# Patient Record
Sex: Male | Born: 1940 | Race: White | Hispanic: No | Marital: Married | Smoking: Never smoker
Health system: Southern US, Community
[De-identification: ages and names within clinical notes are randomized; demographics above are authoritative.]

## PROBLEM LIST (undated history)

## (undated) DIAGNOSIS — N529 Male erectile dysfunction, unspecified: Secondary | ICD-10-CM

## (undated) DIAGNOSIS — B009 Herpesviral infection, unspecified: Secondary | ICD-10-CM

## (undated) DIAGNOSIS — K219 Gastro-esophageal reflux disease without esophagitis: Secondary | ICD-10-CM

## (undated) DIAGNOSIS — F419 Anxiety disorder, unspecified: Secondary | ICD-10-CM

## (undated) DIAGNOSIS — E1140 Type 2 diabetes mellitus with diabetic neuropathy, unspecified: Secondary | ICD-10-CM

## (undated) DIAGNOSIS — Q211 Atrial septal defect: Secondary | ICD-10-CM

## (undated) DIAGNOSIS — Z8709 Personal history of other diseases of the respiratory system: Secondary | ICD-10-CM

## (undated) DIAGNOSIS — R109 Unspecified abdominal pain: Secondary | ICD-10-CM

## (undated) DIAGNOSIS — I5189 Other ill-defined heart diseases: Secondary | ICD-10-CM

## (undated) DIAGNOSIS — Z8489 Family history of other specified conditions: Secondary | ICD-10-CM

## (undated) DIAGNOSIS — Q2112 Patent foramen ovale: Secondary | ICD-10-CM

## (undated) DIAGNOSIS — I495 Sick sinus syndrome: Secondary | ICD-10-CM

## (undated) DIAGNOSIS — I4819 Other persistent atrial fibrillation: Secondary | ICD-10-CM

## (undated) DIAGNOSIS — I480 Paroxysmal atrial fibrillation: Secondary | ICD-10-CM

## (undated) DIAGNOSIS — I639 Cerebral infarction, unspecified: Secondary | ICD-10-CM

## (undated) DIAGNOSIS — I10 Essential (primary) hypertension: Secondary | ICD-10-CM

## (undated) DIAGNOSIS — K760 Fatty (change of) liver, not elsewhere classified: Secondary | ICD-10-CM

## (undated) DIAGNOSIS — M549 Dorsalgia, unspecified: Secondary | ICD-10-CM

## (undated) DIAGNOSIS — E7800 Pure hypercholesterolemia, unspecified: Secondary | ICD-10-CM

## (undated) DIAGNOSIS — I2510 Atherosclerotic heart disease of native coronary artery without angina pectoris: Secondary | ICD-10-CM

## (undated) DIAGNOSIS — M1990 Unspecified osteoarthritis, unspecified site: Secondary | ICD-10-CM

## (undated) DIAGNOSIS — Z8619 Personal history of other infectious and parasitic diseases: Secondary | ICD-10-CM

## (undated) DIAGNOSIS — K5790 Diverticulosis of intestine, part unspecified, without perforation or abscess without bleeding: Secondary | ICD-10-CM

## (undated) DIAGNOSIS — N200 Calculus of kidney: Secondary | ICD-10-CM

## (undated) HISTORY — DX: Cerebral infarction, unspecified: I63.9

## (undated) HISTORY — DX: Essential (primary) hypertension: I10

## (undated) HISTORY — DX: Paroxysmal atrial fibrillation: I48.0

## (undated) HISTORY — DX: Sick sinus syndrome: I49.5

## (undated) HISTORY — DX: Pure hypercholesterolemia, unspecified: E78.00

## (undated) HISTORY — DX: Other ill-defined heart diseases: I51.89

## (undated) HISTORY — DX: Personal history of other infectious and parasitic diseases: Z86.19

## (undated) HISTORY — DX: Other persistent atrial fibrillation: I48.19

## (undated) HISTORY — DX: Dorsalgia, unspecified: M54.9

## (undated) HISTORY — DX: Unspecified osteoarthritis, unspecified site: M19.90

## (undated) HISTORY — DX: Diverticulosis of intestine, part unspecified, without perforation or abscess without bleeding: K57.90

## (undated) HISTORY — DX: Type 2 diabetes mellitus with diabetic neuropathy, unspecified: E11.40

## (undated) HISTORY — DX: Fatty (change of) liver, not elsewhere classified: K76.0

## (undated) HISTORY — DX: Anxiety disorder, unspecified: F41.9

## (undated) HISTORY — DX: Patent foramen ovale: Q21.12

## (undated) HISTORY — DX: Herpesviral infection, unspecified: B00.9

## (undated) HISTORY — DX: Unspecified abdominal pain: R10.9

## (undated) HISTORY — PX: HERNIA REPAIR: SHX51

## (undated) HISTORY — DX: Atrial septal defect: Q21.1

## (undated) HISTORY — DX: Male erectile dysfunction, unspecified: N52.9

## (undated) HISTORY — DX: Personal history of other diseases of the respiratory system: Z87.09

## (undated) HISTORY — DX: Calculus of kidney: N20.0

---

## 1981-08-08 HISTORY — PX: CHOLECYSTECTOMY: SHX55

## 1999-07-30 ENCOUNTER — Encounter: Admission: RE | Admit: 1999-07-30 | Discharge: 1999-07-30 | Payer: Self-pay | Admitting: Family Medicine

## 1999-07-30 ENCOUNTER — Encounter: Payer: Self-pay | Admitting: Family Medicine

## 2000-10-13 ENCOUNTER — Encounter: Admission: RE | Admit: 2000-10-13 | Discharge: 2000-10-13 | Payer: Self-pay | Admitting: Family Medicine

## 2000-10-13 ENCOUNTER — Encounter: Payer: Self-pay | Admitting: Family Medicine

## 2000-12-27 ENCOUNTER — Encounter: Admission: RE | Admit: 2000-12-27 | Discharge: 2000-12-27 | Payer: Self-pay | Admitting: Family Medicine

## 2000-12-27 ENCOUNTER — Encounter: Payer: Self-pay | Admitting: Family Medicine

## 2002-02-01 ENCOUNTER — Encounter: Admission: RE | Admit: 2002-02-01 | Discharge: 2002-02-01 | Payer: Self-pay | Admitting: Family Medicine

## 2002-02-01 ENCOUNTER — Encounter: Payer: Self-pay | Admitting: Family Medicine

## 2002-09-27 ENCOUNTER — Ambulatory Visit (HOSPITAL_COMMUNITY): Admission: RE | Admit: 2002-09-27 | Discharge: 2002-09-27 | Payer: Self-pay | Admitting: Family Medicine

## 2002-09-27 ENCOUNTER — Encounter: Payer: Self-pay | Admitting: Cardiology

## 2002-12-05 ENCOUNTER — Encounter: Admission: RE | Admit: 2002-12-05 | Discharge: 2003-03-05 | Payer: Self-pay | Admitting: Family Medicine

## 2003-10-20 ENCOUNTER — Encounter: Admission: RE | Admit: 2003-10-20 | Discharge: 2003-10-20 | Payer: Self-pay | Admitting: Family Medicine

## 2003-12-22 ENCOUNTER — Emergency Department (HOSPITAL_COMMUNITY): Admission: EM | Admit: 2003-12-22 | Discharge: 2003-12-22 | Payer: Self-pay | Admitting: Emergency Medicine

## 2003-12-22 ENCOUNTER — Emergency Department (HOSPITAL_COMMUNITY): Admission: EM | Admit: 2003-12-22 | Discharge: 2003-12-23 | Payer: Self-pay | Admitting: Emergency Medicine

## 2003-12-30 ENCOUNTER — Ambulatory Visit (HOSPITAL_BASED_OUTPATIENT_CLINIC_OR_DEPARTMENT_OTHER): Admission: RE | Admit: 2003-12-30 | Discharge: 2003-12-30 | Payer: Self-pay | Admitting: Urology

## 2004-09-21 ENCOUNTER — Encounter: Admission: RE | Admit: 2004-09-21 | Discharge: 2004-09-21 | Payer: Self-pay | Admitting: Family Medicine

## 2005-01-14 ENCOUNTER — Ambulatory Visit: Payer: Self-pay

## 2005-01-18 ENCOUNTER — Ambulatory Visit: Payer: Self-pay

## 2005-01-26 ENCOUNTER — Ambulatory Visit: Payer: Self-pay

## 2005-01-28 ENCOUNTER — Ambulatory Visit: Payer: Self-pay | Admitting: Internal Medicine

## 2005-02-02 ENCOUNTER — Ambulatory Visit: Payer: Self-pay | Admitting: Internal Medicine

## 2005-02-02 ENCOUNTER — Encounter (INDEPENDENT_AMBULATORY_CARE_PROVIDER_SITE_OTHER): Payer: Self-pay | Admitting: *Deleted

## 2005-02-24 ENCOUNTER — Ambulatory Visit (HOSPITAL_COMMUNITY): Admission: RE | Admit: 2005-02-24 | Discharge: 2005-02-24 | Payer: Self-pay | Admitting: Cardiology

## 2005-02-24 ENCOUNTER — Ambulatory Visit: Payer: Self-pay | Admitting: Internal Medicine

## 2005-08-12 ENCOUNTER — Encounter: Admission: RE | Admit: 2005-08-12 | Discharge: 2005-08-12 | Payer: Self-pay | Admitting: Family Medicine

## 2006-02-23 ENCOUNTER — Ambulatory Visit: Payer: Self-pay | Admitting: Internal Medicine

## 2007-09-21 ENCOUNTER — Emergency Department (HOSPITAL_COMMUNITY): Admission: EM | Admit: 2007-09-21 | Discharge: 2007-09-21 | Payer: Self-pay | Admitting: Family Medicine

## 2007-11-16 ENCOUNTER — Emergency Department: Payer: Self-pay | Admitting: Emergency Medicine

## 2007-11-22 ENCOUNTER — Emergency Department: Payer: Self-pay | Admitting: Internal Medicine

## 2008-01-29 ENCOUNTER — Ambulatory Visit: Payer: Self-pay | Admitting: Vascular Surgery

## 2008-01-29 ENCOUNTER — Ambulatory Visit: Admission: RE | Admit: 2008-01-29 | Discharge: 2008-01-29 | Payer: Self-pay | Admitting: Family Medicine

## 2008-01-29 ENCOUNTER — Encounter: Payer: Self-pay | Admitting: Family Medicine

## 2009-03-27 ENCOUNTER — Encounter (INDEPENDENT_AMBULATORY_CARE_PROVIDER_SITE_OTHER): Payer: Self-pay | Admitting: *Deleted

## 2009-07-29 ENCOUNTER — Encounter: Admission: RE | Admit: 2009-07-29 | Discharge: 2009-07-29 | Payer: Self-pay | Admitting: Internal Medicine

## 2009-08-08 LAB — HM COLONOSCOPY

## 2009-08-21 ENCOUNTER — Telehealth: Payer: Self-pay | Admitting: Internal Medicine

## 2009-08-24 ENCOUNTER — Encounter (INDEPENDENT_AMBULATORY_CARE_PROVIDER_SITE_OTHER): Payer: Self-pay | Admitting: *Deleted

## 2009-09-18 ENCOUNTER — Encounter (INDEPENDENT_AMBULATORY_CARE_PROVIDER_SITE_OTHER): Payer: Self-pay | Admitting: *Deleted

## 2009-09-21 ENCOUNTER — Ambulatory Visit: Payer: Self-pay | Admitting: Internal Medicine

## 2009-09-21 ENCOUNTER — Encounter (INDEPENDENT_AMBULATORY_CARE_PROVIDER_SITE_OTHER): Payer: Self-pay | Admitting: *Deleted

## 2009-09-28 ENCOUNTER — Telehealth (INDEPENDENT_AMBULATORY_CARE_PROVIDER_SITE_OTHER): Payer: Self-pay | Admitting: *Deleted

## 2009-09-29 ENCOUNTER — Ambulatory Visit: Payer: Self-pay | Admitting: Internal Medicine

## 2009-09-30 ENCOUNTER — Encounter: Payer: Self-pay | Admitting: Internal Medicine

## 2010-09-07 NOTE — Miscellaneous (Signed)
Summary: Prilosec was ordered for pt in RR  Clinical Lists Changes  Medications: Added new medication of PRILOSEC OTC 20 MG  TBEC (OMEPRAZOLE MAGNESIUM) 1 each day 30 minutes before meal for 2 weeks - Signed Rx of PRILOSEC OTC 20 MG  TBEC (OMEPRAZOLE MAGNESIUM) 1 each day 30 minutes before meal for 2 weeks;  #30 x 1;  Signed;  Entered by: Doristine Church RN II;  Authorized by: Hart Carwin MD;  Method used: [facility]  [address] [place], [place]  [address], Ph: [phone] or [phone], Fax: [phone] Observations: Added new observation of ALLERGY REV: Done (09/30/2009 8:04) Added new observation of NKA: T (09/30/2009 8:04)    Prescriptions: PRILOSEC OTC 20 MG  TBEC (OMEPRAZOLE MAGNESIUM) 1 each day 30 minutes before meal for 2 weeks  #30 x 1   Entered by:   Doristine Church RN II   Authorized by:   Hart Carwin MD   Signed by:   Doristine Church RN II on 09/30/2009   Method used:   [facility]ically [facility]        [facility]  [address] (retail)       [address]       [place], [place]  [address]       Ph: [phone] or [phone]       Fax: [phone]   RxID:   [id number]

## 2010-09-07 NOTE — Procedures (Signed)
Summary: Colonoscopy  Patient: [name] Note: All result statuses are Final unless otherwise noted.  Tests: (1) Colonoscopy (COL)   COL Colonoscopy           DONE     [facility]     [address]     [place], [place]  [address]           COLONOSCOPY PROCEDURE REPORT           PATIENT:  [name] [name], [name] [name]  MR#:  [id number]     BIRTHDATE:  1941/06/06, 68 yrs. old  GENDER:  male           ENDOSCOPIST:  Hedwig Morton. Juanda Chance, MD     Referred by:  Tomi Bamberger, NP           PROCEDURE DATE:  09/29/2009     PROCEDURE:  Colonoscopy 11914     ASA CLASS:  Class I     INDICATIONS:  several indirect relatives ( GM) with colon cancer     last colon 1999 and 2005           MEDICATIONS:   Versed 3 mg, Fentanyl 25 mcg           DESCRIPTION OF PROCEDURE:   After the risks benefits and     alternatives of the procedure were thoroughly explained, informed     consent was obtained.  Digital rectal exam was performed and     revealed no rectal masses.   The LB CF-H180AL [id number] endoscope     was introduced through the anus and advanced to the cecum, which     was identified by both the appendix and ileocecal valve, without     limitations.  The quality of the prep was excellent, using     MiraLax.  The instrument was then slowly withdrawn as the colon     was fully examined.     <<PROCEDUREIMAGES>>           FINDINGS:  Moderate diverticulosis was found throughout the colon     (see image1, image2, image3, and image6).  This was otherwise a     normal examination of the colon (see image7, image5, and image4).     Retroflexed views in the rectum revealed no abnormalities.    The     scope was then withdrawn from the patient and the procedure     completed.           COMPLICATIONS:  None           ENDOSCOPIC IMPRESSION:     1) Moderate diverticulosis throughout the colon     2) Otherwise normal examination     RECOMMENDATIONS:     high fiber diet           REPEAT EXAM:  In 10 year(s) for.         ______________________________     Hedwig Morton. Juanda Chance, MD           CC:           n.     eSIGNED:   Hedwig Morton. Janny Crute at 09/29/2009 08:58 AM           Asa Saunas, [id number]  Note: An exclamation mark (!) indicates a result that was not dispersed into the flowsheet. Document Creation Date: 09/29/2009 8:58 AM _______________________________________________________________________  (1) Order result status: Final Collection or observation date-time: 09/29/2009 08:40 Requested date-time:  Receipt date-time:  Reported date-time:  Referring Physician:  Ordering Physician: [name] [phone]) Specimen Source:  Source: Launa Grill Order Number: [id number] Lab site:   Appended Document: Colonoscopy    Clinical Lists Changes  Observations: Added new observation of COLONNXTDUE: 09/2019 (09/29/2009 10:34)      Appended Document: Colonoscopy     Procedures Next Due Date:    Colonoscopy: 10/2019

## 2010-09-07 NOTE — Progress Notes (Signed)
Summary: Schedule Recall Colonoscopy  Phone Note Outgoing Call Call back at [facility] Phone [phone]   Call placed by: Christie Nottingham CMA Duncan Dull),  August 21, 2009 11:01 AM Call placed to: Patient Summary of Call: Called pt to schedule recall colonoscopy. Pt is wondering if he can get a Upper Endoscopy at the same time b/c he is having some symptoms of  sore throat and coughing. He states he inhaled some harsh chemicals last week and coughed up blood. He has already seen his PCP and had a x-ray of his lungs and it was normal but wants to have his stomach looked at also. I offered a office visit but he declined and would like this set up at the same time of his Colonoscopy. Can we schedule a direct EGD/ Colon? Initial call taken by: Christie Nottingham CMA Duncan Dull),  August 21, 2009 11:05 AM  Follow-up for Phone Call        OK to schedule direct EGD/Colon Follow-up by: Hart Carwin MD,  August 21, 2009 11:03 PM  Additional Follow-up for Phone Call Additional follow up Details #1::        Schedule EGD/ Colon on 09-29-09. Pt notified and letter mailed to pt with previsit info.  Additional Follow-up by: Christie Nottingham CMA Duncan Dull),  August 24, 2009 8:54 AM

## 2010-09-07 NOTE — Letter (Signed)
Summary: Diabetic Instructions  [facility]  [address] [place], [place] [address]   Phone: [phone]  Fax: [phone]    CANNEN DUPRAS 1940-12-23 MRN: [id number]   _  _   ORAL DIABETIC MEDICATION INSTRUCTIONS  The day before your procedure:   Take your diabetic pill as you do normally  The day of your procedure:   Do not take your diabetic pill    We will check your blood sugar levels during the admission process and again in Recovery before discharging you home  ________________________________________________________________________

## 2010-09-07 NOTE — Progress Notes (Signed)
Summary: ? re prep  Phone Note Call from Patient Call back at Home Phone [phone]   Caller: Patient Call For: BRODIE Reason for Call: Talk to Nurse Summary of Call: Patient has questions regarding his prep. having procedure tomorrow Initial call taken by: Tawni Levy,  September 28, 2009 11:02 AM  Follow-up for Phone Call        no answer. Will try again later. Follow-up by: Ezra Sites RN,  September 28, 2009 11:51 AM    Additional Follow-up for Phone Call Additional follow up Details #2::    talked to pt's wife.  She says that pt had his question answered by pharmacist. Follow-up by: Ezra Sites RN,  September 28, 2009 4:36 PM

## 2010-09-07 NOTE — Miscellaneous (Signed)
Summary: [facility]  Clinical Lists Changes  Medications: Added new medication of MIRALAX   POWD (POLYETHYLENE GLYCOL 3350) As per prep  instructions. - Signed Added new medication of REGLAN 10 MG  TABS (METOCLOPRAMIDE HCL) As per prep instructions. - Signed Added new medication of DULCOLAX 5 MG  TBEC (BISACODYL) Day before procedure take 2 at 3pm and 2 at 8pm. - Signed Rx of MIRALAX   POWD (POLYETHYLENE GLYCOL 3350) As per prep  instructions.;  #255gm x 0;  Signed;  Entered by: Ezra Sites RN;  Authorized by: Hart Carwin MD;  Method used: [facility]  [address] [place], [place]  [address], Ph: [phone] or [phone], Fax: [phone] Rx of REGLAN 10 MG  TABS (METOCLOPRAMIDE HCL) As per prep instructions.;  #2 x 0;  Signed;  Entered by: Ezra Sites RN;  Authorized by: Hart Carwin MD;  Method used: [facility]  [address] [place], [place]  [address], Ph: [phone] or [phone], Fax: [phone] Rx of DULCOLAX 5 MG  TBEC (BISACODYL) Day before procedure take 2 at 3pm and 2 at 8pm.;  #4 x 0;  Signed;  Entered by: Ezra Sites RN;  Authorized by: Hart Carwin MD;  Method used: [facility]  [address] [place], [place]  [address], Ph: [phone] or [phone], Fax: [phone] Observations: Added new observation of NKA: T (09/21/2009 7:47)    Prescriptions: DULCOLAX 5 MG  TBEC (BISACODYL) Day before procedure take 2 at 3pm and 2 at 8pm.  #4 x 0   Entered by:   Ezra Sites RN   Authorized by:   Hart Carwin MD   Signed by:   Ezra Sites RN on 09/21/2009   Method used:   Electronically to        [facility]  [address] (retail)       [address]       [place], [place]  [address]       Ph: [phone] or [phone]       Fax: [phone]   RxID:   [id number] REGLAN 10 MG  TABS (METOCLOPRAMIDE HCL) As per prep instructions.  #2 x 0   Entered by:   Ezra Sites RN   Authorized by:   Hart Carwin MD   Signed by:   Ezra Sites RN on 09/21/2009   Method used:   Electronically to        [facility]  [address] (retail)       [address]       [place], [place]  [address]       Ph: [phone] or [phone]       Fax: [phone]   RxID:   [id number] MIRALAX   POWD (POLYETHYLENE GLYCOL 3350) As per prep  instructions.  #255gm x 0   Entered by:   Ezra Sites RN   Authorized by:   Hart Carwin MD   Signed by:   Ezra Sites RN on 09/21/2009   Method used:   Electronically to        [facility]  [address] (retail)       [address]       [place], [place]  [address]       Ph: [phone] or [phone]       Fax: [phone]   RxID:   [id number]

## 2010-09-07 NOTE — Letter (Signed)
Summary: [facility] Instructions  [facility]  [address] [place], [place] [address]   Phone: [phone]  Fax: [phone]       [name] [name]    Dec 25, 1970    MRN: [id number]       Procedure Day /Date:  Tuesday 09/29/2009     Arrival Time:  7:30 am     Procedure Time: 8:00 am     Location of Procedure:                    _x _  [facility] (4th Floor)    PREPARATION FOR COLONOSCOPY WITH MIRALAX  Starting 5 days prior to your procedure Thursday 2/17 do not eat nuts, seeds, popcorn, corn, beans, peas,  salads, or any raw vegetables.  Do not take any fiber supplements (e.g. Metamucil, Citrucel, and Benefiber). ____________________________________________________________________________________________________   THE DAY BEFORE YOUR PROCEDURE         DATE: Monday 2/21  1   Drink clear liquids the entire day-NO SOLID FOOD  2   Do not drink anything colored red or purple.  Avoid juices with pulp.  No orange juice.  3   Drink at least 64 oz. (8 glasses) of fluid/clear liquids during the day to prevent dehydration and help the prep work efficiently.  CLEAR LIQUIDS INCLUDE: Water Jello Ice Popsicles Tea (sugar ok, no milk/cream) Powdered fruit flavored drinks Coffee (sugar ok, no milk/cream) Gatorade Juice: apple, white grape, white cranberry  Lemonade Clear bullion, consomm, broth Carbonated beverages (any kind) Strained chicken noodle soup Hard Candy  4   Mix the entire bottle of Miralax with 64 oz. of Gatorade/Powerade in the morning and put in the refrigerator to chill.  5   At 3:00 pm take 2 Dulcolax/Bisacodyl tablets.  6   At 4:30 pm take one Reglan/Metoclopramide tablet.  7  Starting at 5:00 pm drink one 8 oz glass of the Miralax mixture every 15-20 minutes until you have finished drinking the entire 64 oz.  You should finish drinking prep around 7:30 or 8:00 pm.  8   If you are nauseated, you may take the 2nd Reglan/Metoclopramide tablet  at 6:30 pm.        9    At 8:00 pm take 2 more DULCOLAX/Bisacodyl tablets.     THE DAY OF YOUR PROCEDURE      DATE:  Tuesday 2/22  You may drink clear liquids until 6:00 am  (2 HOURS BEFORE PROCEDURE).   MEDICATION INSTRUCTIONS  Unless otherwise instructed, you should take regular prescription medications with a small sip of water as early as possible the morning of your procedure.  Diabetic patients - see separate instructions.           OTHER INSTRUCTIONS  You will need a responsible adult at least 71 years of age to accompany you and drive you home.   This person must remain in the waiting room during your procedure.  Wear loose fitting clothing that is easily removed.  Leave jewelry and other valuables at home.  However, you may wish to bring a book to read or an iPod/MP3 player to listen to music as you wait for your procedure to start.  Remove all body piercing jewelry and leave at home.  Total time from sign-in until discharge is approximately 2-3 hours.  You should go home directly after your procedure and rest.  You can resume normal activities the day after your procedure.  The day of your  procedure you should not:   Drive   Make legal decisions   Operate machinery   Drink alcohol   Return to work  You will receive specific instructions about eating, activities and medications before you leave.   The above instructions have been reviewed and explained to me by   Ezra Sites RN  September 21, 2009 8:18 AM  I fully understand and can verbalize these instructions _____________________________ Date _______

## 2010-09-07 NOTE — Letter (Signed)
Summary: Previsit letter  [facility]  [address] [place], [place] [address]   Phone: [phone]  Fax: [phone]       08/24/2009 MRN: [id number]  [name] [name] [address] [place], [place]  [address]  Dear [name] [name],  Welcome to the Gastroenterology Division at [facility].    You are scheduled to see a nurse for your pre-procedure visit on  09/21/09 at  8:00am  on the 3rd floor at [facility], [address]  We ask that you try to arrive at our office 15 minutes prior to your appointment time to allow for check-in.  Your nurse visit will consist of discussing your medical and surgical history, your immediate family medical history, and your medications.    Please bring a complete list of all your medications or, if you prefer, bring the medication bottles and we will list them.  We will need to be aware of both prescribed and over the counter drugs.  We will need to know exact dosage information as well.  If you are on blood thinners (Coumadin, Plavix, Aggrenox, Ticlid, etc.) please call our office today/prior to your appointment, as we need to consult with your physician about holding your medication.   Please be prepared to read and sign documents such as consent forms, a financial agreement, and acknowledgement forms.  If necessary, and with your consent, a friend or relative is welcome to sit-in on the nurse visit with you.  Please bring your insurance card so that we may make a copy of it.  If your insurance requires a referral to see a specialist, please bring your referral form from your primary care physician.  No co-pay is required for this nurse visit.     If you cannot keep your appointment, please call [phone] to cancel or reschedule prior to your appointment date.  This allows [place] the opportunity to schedule an appointment for another patient in need of care.    Thank you for choosing [facility] for your medical  needs.  We appreciate the opportunity to care for you.  Please visit [place] at our website  to learn more about our practice.                     Sincerely.                                                                                                                   The Gastroenterology Division

## 2010-09-07 NOTE — Procedures (Signed)
Summary: Upper Endoscopy  Patient: [name] [name] Note: All result statuses are Final unless otherwise noted.  Tests: (1) Upper Endoscopy (EGD)   EGD Upper Endoscopy       DONE     [facility]     [address]     [place], [place]  [address]           ENDOSCOPY PROCEDURE REPORT           PATIENT:  Fue, Cervenka  MR#:  [id number]     BIRTHDATE:  1941-08-07, 68 yrs. old  GENDER:  male           ENDOSCOPIST:  Hedwig Morton. Juanda Chance, MD     Referred by:  Tomi Bamberger, NP           PROCEDURE DATE:  09/29/2009     PROCEDURE:  EGD with biopsy     ASA CLASS:  Class I     INDICATIONS:  GERD last EGD 2006 was normal, pt no longer takes     Nexiem,           MEDICATIONS:   Versed 5 mg, Fentanyl 50 mcg     TOPICAL ANESTHETIC:  Exactacain Spray           DESCRIPTION OF PROCEDURE:   After the risks benefits and     alternatives of the procedure were thoroughly explained, informed     consent was obtained.  The LB GIF-H180 [id number] endoscope was     introduced through the mouth and advanced to the second portion of     the duodenum, without limitations.  The instrument was slowly     withdrawn as the mucosa was fully examined.     <<PROCEDUREIMAGES>>           Mild gastritis was found. several streaks of coffee ground     material in the antrum, mild erythema and few erosions With     standard forceps, a biopsy was obtained and sent to pathology (see     image2).  A sessile polyp was found in the fundus. fundic gland     polyp With standard forceps, a biopsy was obtained and sent to     pathology (see image5 and image1).  Normal GE junction was noted.     With standard forceps, a biopsy was obtained and sent to pathology     (see image6).  Otherwise the examination was normal (see image3).     Retroflexed views revealed no abnormalities.    The scope was then     withdrawn from the patient and the procedure completed.           COMPLICATIONS:  None           ENDOSCOPIC IMPRESSION:     1)  Mild gastritis     2) Sessile polyp in the fundus     3) Normal GE junction     4) Otherwise normal examination     RECOMMENDATIONS:     1) await biopsy results     Prelosec 20 mg/day x 2 weeks. #30, 1 refill           REPEAT EXAM:  In 0 year(s) for.           ______________________________     Hedwig Morton. Juanda Chance, MD           CC:           n.     eSIGNED:  Hedwig Morton. Hanalei Glace at 09/29/2009 08:52 AM           Asa Saunas, [id number]  Note: An exclamation mark (!) indicates a result that was not dispersed into the flowsheet. Document Creation Date: 09/29/2009 8:52 AM _______________________________________________________________________  (1) Order result status: Final Collection or observation date-time: 09/29/2009 08:27 Requested date-time:  Receipt date-time:  Reported date-time:  Referring Physician:   Ordering Physician: [name] [phone]) Specimen Source:  Source: Launa Grill Order Number: [id number] Lab site:

## 2010-09-07 NOTE — Letter (Signed)
Summary: Patient Notice-Endo Biopsy Results  [facility]  [address] [place], [place] [address]   Phone: [phone]  Fax: [phone]        September 30, 2009 MRN: [id number]    [facility] [address] [place], [place]  [address]    Dear Mr. MCMEEN,  I am pleased to inform you that the biopsies taken during your recent endoscopic examination did not show any evidence of cancer upon pathologic examination.There was an mild inflammation due to acid reflux.  Additional information/recommendations:  __No further action is needed at this time.  Please follow-up with      your primary care physician for your other healthcare needs.  __ Please call [phone] to schedule a return visit to review      your condition.  x__ Continue with the treatment plan as outlined on the day of your      exam.     Please call us if you are having persistent problems or have questions about your condition that have not been fully answered at this time.  Sincerely,  Hart Carwin MD  This letter has been electronically signed by your physician.  Appended Document: Patient Notice-Endo Biopsy Results  Letter mailed 2.24.11

## 2010-10-28 LAB — GLUCOSE, CAPILLARY: Glucose-Capillary: 123 mg/dL — ABNORMAL HIGH (ref 70–99)

## 2010-11-22 ENCOUNTER — Encounter: Payer: Self-pay | Admitting: Nurse Practitioner

## 2010-11-24 ENCOUNTER — Encounter: Payer: Self-pay | Admitting: Cardiovascular Disease

## 2010-11-26 ENCOUNTER — Encounter: Payer: Self-pay | Admitting: *Deleted

## 2010-11-29 ENCOUNTER — Encounter: Payer: Self-pay | Admitting: Cardiovascular Disease

## 2010-11-29 ENCOUNTER — Ambulatory Visit (INDEPENDENT_AMBULATORY_CARE_PROVIDER_SITE_OTHER): Payer: Medicare Other | Admitting: Cardiovascular Disease

## 2010-11-29 DIAGNOSIS — E785 Hyperlipidemia, unspecified: Secondary | ICD-10-CM | POA: Insufficient documentation

## 2010-11-29 DIAGNOSIS — E7800 Pure hypercholesterolemia, unspecified: Secondary | ICD-10-CM

## 2010-11-29 DIAGNOSIS — R079 Chest pain, unspecified: Secondary | ICD-10-CM | POA: Insufficient documentation

## 2010-11-29 DIAGNOSIS — R011 Cardiac murmur, unspecified: Secondary | ICD-10-CM

## 2010-11-29 NOTE — Assessment & Plan Note (Signed)
His cholesterol levels are being followed by Tomi Bamberger, NP.

## 2010-11-29 NOTE — Progress Notes (Signed)
[name] [name] Date of Birth  [birth year] [facility] [address]     [address] [place], [place]  [address] [phone]  Fax  [phone]  History of Present Illness:   episode of cp while working out at [employer].  Fleeting , radiates to L shoulder. While exercising. Several other episodes but not as bad.  Not assoc. With eating, drinking, twisting.  No leg swelling, no syncope.  Occasional L arm pain.  Continues to have mild discomfort in his chest.  Not assoc with diaphoresis or nausea.  [name] [name] is a middle-aged gentleman with history of hypertension and hypercholesterolemia.   He's been active all his life. He recently started having some episodes of chest discomfort with exercise. His chest pain was described as a sharp sensation radiating from the right side of his chest the left side of his chest. It has occurred with exertion. It does not occur with rest. He's had several subsequent episodes but they've not been as bad as the first episode.  These episodes last a few seconds.   Current Outpatient Prescriptions on File Prior to Visit  Medication Sig Dispense Refill  . Alpha-Lipoic Acid (LIPOIC ACID PO) Take by mouth daily.        Marland Kitchen aspirin 81 MG tablet Take 81 mg by mouth daily.        . Biotin 1000 MCG tablet Take 1,000 mcg by mouth daily.        . calcium-vitamin D (OSCAL WITH D) 250-125 MG-UNIT per tablet Take 1 tablet by mouth daily.        . cholecalciferol (VITAMIN D) 1000 UNITS tablet Take 1,000 Units by mouth daily.        . Cinnamon 500 MG capsule Take 500 mg by mouth daily.        . clonazePAM (KLONOPIN) 0.5 MG tablet Take 0.5 mg by mouth 2 (two) times daily.        . fenofibrate 160 MG tablet Take 160 mg by mouth daily.        Marland Kitchen FLUoxetine (PROZAC) 20 MG capsule Take 20 mg by mouth daily.        Marland Kitchen gabapentin (NEURONTIN) 100 MG capsule Take 300 mg by mouth daily.       Marland Kitchen losartan (COZAAR) 100 MG tablet Take 100 mg by mouth daily.        Marland Kitchen  Lysine 500 MG CAPS Take 500 mg by mouth daily.        . multivitamin (THERAGRAN) per tablet Take 1 tablet by mouth daily.        . Omega-3 Fatty Acids (SALMON OIL) 200 MG CAPS Take 1 capsule by mouth daily.        . simvastatin (ZOCOR) 20 MG tablet Take 20 mg by mouth at bedtime.          No Known Allergies  Past Medical History  Diagnosis Date  . Diabetes mellitus   . Hypertension   . Chest pain   . Testicular pain   . Flank pain   . Diabetic neuropathy   . Anxiety   . Hypercholesterolemia   . Asthma   . History of bronchitis     Past Surgical History  Procedure Date  . Cholecystectomy   . Hernia repair     X3    History  Smoking status  . Never Smoker   Smokeless tobacco  . Not on file    History  Alcohol Use  No    Family History  Problem Relation Age of Onset  . Stroke      family history  . Coronary artery disease      family history    Reviw of Systems:  Reviewed in the HPI.  All other systems are negative.  Physical Exam: BP 150/80  Pulse 66  Ht 5\' 8"  (1.727 m)  Wt 177 lb 12.8 oz (80.65 kg)  BMI 27.03 kg/m2 The patient is alert and oriented x 3.  The mood and affect are normal.  The skin is warm and dry.  Color is normal.  The HEENT exam reveals that the sclera are nonicteric.  The mucous membranes are moist.  The carotids are 2+ without bruits.  There is no thyromegaly.  There is no JVD.  The lungs are clear.  The chest wall is non tender.  The heart exam reveals a regular rate with a normal S1 and S2.  There is a soft systolic murmur.  The PMI is not displaced.   Abdominal exam reveals good bowel sounds.  There is no guarding or rebound.  There is no hepatosplenomegaly or tenderness.  There are no masses.  Exam of the legs reveal no clubbing, cyanosis, or edema.  The legs are without rashes.  The distal pulses are intact.  Cranial nerves II - XII are intact.  Motor and sensory functions are intact.  The gait is normal.  ECG: His EKG from Susan's  office reveals sinus bradycardia. He has a first degree AV block. There are no ST or T wave changes. Assessment / Plan:

## 2010-11-29 NOTE — Assessment & Plan Note (Signed)
[name] [name] presents with exertional chest pain. These episodes are somewhat atypical in that they only last for several seconds but they do occur with exertion. He has not had any episodes with rest.  We will schedule him for a stress Myoview study for further evaluation. He is already on aspirin. We will see him back in one month.

## 2010-12-07 ENCOUNTER — Ambulatory Visit (HOSPITAL_COMMUNITY): Payer: Medicare Other | Attending: Cardiovascular Disease | Admitting: Radiology

## 2010-12-07 VITALS — Ht 68.0 in | Wt 175.0 lb

## 2010-12-07 DIAGNOSIS — R079 Chest pain, unspecified: Secondary | ICD-10-CM | POA: Insufficient documentation

## 2010-12-07 DIAGNOSIS — I440 Atrioventricular block, first degree: Secondary | ICD-10-CM

## 2010-12-07 DIAGNOSIS — R0789 Other chest pain: Secondary | ICD-10-CM

## 2010-12-07 DIAGNOSIS — R0989 Other specified symptoms and signs involving the circulatory and respiratory systems: Secondary | ICD-10-CM

## 2010-12-07 DIAGNOSIS — I491 Atrial premature depolarization: Secondary | ICD-10-CM

## 2010-12-07 DIAGNOSIS — R011 Cardiac murmur, unspecified: Secondary | ICD-10-CM | POA: Insufficient documentation

## 2010-12-07 MED ORDER — TECHNETIUM TC 99M TETROFOSMIN IV KIT
33.0000 | PACK | Freq: Once | INTRAVENOUS | Status: AC | PRN
  Administered 2010-12-07: 33 via INTRAVENOUS

## 2010-12-07 MED ORDER — TECHNETIUM TC 99M TETROFOSMIN IV KIT
10.8000 | PACK | Freq: Once | INTRAVENOUS | Status: AC | PRN
  Administered 2010-12-07: 11 via INTRAVENOUS

## 2010-12-07 NOTE — Progress Notes (Signed)
[facility] SITE 3 NUCLEAR MED [address] [place] [address] [phone]  Cardiology Nuclear Med Study  [name] [name] is a 70 y.o. male [id number] 01-29-1941   Nuclear Med Background Indication for Stress Test:  Evaluation for Ischemia History:  '97 GXT(-);'04 Echo: EF=55-65% Cardiac Risk Factors: Family History - CAD, Hypertension, Lipids and NIDDM  Symptoms:  Chest Pain with Exertion (last date of chest discomfort one week ago), Diaphoresis, Dizziness, Nausea, Near Syncope, Palpitations and Rapid HR   Nuclear Pre-Procedure Caffeine/Decaff Intake:  None NPO After: 5:00am   Lungs:  Clear IV 0.9% NS with Angio Cath:  20g  IV Site: R Wrist  IV Started by:  Cathlyn Parsons, RN  Chest Size (in):  44 Cup Size: n/a  Height: 5\' 8"  (1.727 m)  Weight:  175 lb (79.379 kg)  BMI:  Body mass index is 26.61 kg/(m^2). Tech Comments:  Stress test and images reviewed by Dr. Clifton James prior to pt leaving. Stated okay for pt to leave. F/U with Dr. Elease Hashimoto.   W.Deal,RT-N    Nuclear Med Study 1 or 2 day study: 1 day  Stress Test Type:  Stress  Reading MD: Arvilla Meres, MD  Order Authorizing Provider:  Kristeen Miss, MD  Resting Radionuclide: Technetium 11m Tetrofosmin  Resting Radionuclide Dose: 10.8 mCi   Stress Radionuclide:  Technetium 82m Tetrofosmin  Stress Radionuclide Dose: 33 mCi           Stress Protocol Rest HR: 51 Stress HR: 136  Rest BP: 154/74 Stress BP: 224/93  Exercise Time (min): 10:30 METS: 11.8   Predicted Max HR: 150 bpm % Max HR: 90.67 bpm Rate Pressure Product: 21308   Dose of Adenosine (mg):  n/a Dose of Lexiscan: n/a mg  Dose of Atropine (mg): n/a Dose of Dobutamine: n/a mcg/kg/min (at max HR)  Stress Test Technologist: Irean Hong, RN  Nuclear Technologist:  Domenic Polite, CNMT     Rest Procedure:  Myocardial perfusion imaging was performed at rest 45 minutes following the intravenous administration of Technetium 25m  Tetrofosmin. Rest ECG: SB with 1st degree HB  Stress Procedure:  The patient exercised for 10 minutes and 30 seconds.  The patient stopped due to DOE and denied any chest pain.  There were significant ST-T wave changes, frequent PAC's, rare PVC. The patient had a hypertensive response to exercise.  Technetium 105m Tetrofosmin was injected at peak exercise and myocardial perfusion imaging was performed after a brief delay. Stress ECG: Equivocal stress ECG with 2 mm mostly upsloping ST depression  QPS Raw Data Images:  Normal; no motion artifact; normal heart/lung ratio. Stress Images:  Normal homogeneous uptake in all areas of the myocardium. Rest Images:  Normal homogeneous uptake in all areas of the myocardium. Subtraction (SDS):  Normal Transient Ischemic Dilatation (Normal <1.22): .95  Lung/Heart Ratio (Normal <0.45): .34  Quantitative Gated Spect Images QGS EDV:  101 ml QGS ESV:  32 ml QGS cine images:  NL LV Function; NL Wall Motion QGS EF: 68%  Impression Exercise Capacity:  Excellent exercise capacity. BP Response:  Hypertensive blood pressure response. Clinical Symptoms:  There is dyspnea. ECG Impression:  Equivocal stress ECG with 2 mm mostly upsloping ST depression Comparison with Prior Nuclear Study: No previous nuclear study performed  Overall Impression:  Normal stress nuclear study.       Daniel Bensimhon

## 2010-12-08 ENCOUNTER — Telehealth: Payer: Self-pay | Admitting: *Deleted

## 2010-12-08 NOTE — Telephone Encounter (Signed)
Patient called with echo results. Pt verbalized understanding. Jodette Tuwanda Vokes RN  

## 2010-12-08 NOTE — Progress Notes (Signed)
COPY ROUTED TO DR. NAHSER.Falecha Clark ° °

## 2010-12-16 ENCOUNTER — Inpatient Hospital Stay (HOSPITAL_COMMUNITY)
Admission: EM | Admit: 2010-12-16 | Discharge: 2010-12-18 | DRG: 123 | Disposition: A | Discharge status: Home / Self Care | Payer: Medicare Other | Source: Ambulatory Visit | Attending: Emergency Medicine | Admitting: Emergency Medicine

## 2010-12-16 DIAGNOSIS — I498 Other specified cardiac arrhythmias: Secondary | ICD-10-CM | POA: Diagnosis present

## 2010-12-16 DIAGNOSIS — D696 Thrombocytopenia, unspecified: Secondary | ICD-10-CM | POA: Diagnosis present

## 2010-12-16 DIAGNOSIS — E785 Hyperlipidemia, unspecified: Secondary | ICD-10-CM | POA: Diagnosis present

## 2010-12-16 DIAGNOSIS — I10 Essential (primary) hypertension: Secondary | ICD-10-CM | POA: Diagnosis present

## 2010-12-16 DIAGNOSIS — H532 Diplopia: Principal | ICD-10-CM | POA: Diagnosis present

## 2010-12-17 ENCOUNTER — Emergency Department (HOSPITAL_COMMUNITY): Payer: Medicare Other

## 2010-12-17 ENCOUNTER — Inpatient Hospital Stay (HOSPITAL_COMMUNITY): Payer: Medicare Other

## 2010-12-17 DIAGNOSIS — G459 Transient cerebral ischemic attack, unspecified: Secondary | ICD-10-CM

## 2010-12-17 DIAGNOSIS — H532 Diplopia: Secondary | ICD-10-CM

## 2010-12-17 DIAGNOSIS — R51 Headache: Secondary | ICD-10-CM

## 2010-12-17 LAB — DIFFERENTIAL
Basophils Absolute: 0 10*3/uL (ref 0.0–0.1)
Basophils Relative: 0 % (ref 0–1)
Lymphs Abs: 1.9 10*3/uL (ref 0.7–4.0)
Monocytes Absolute: 0.5 10*3/uL (ref 0.1–1.0)
Monocytes Relative: 9 % (ref 3–12)
Neutro Abs: 2.7 10*3/uL (ref 1.7–7.7)

## 2010-12-17 LAB — LIPID PANEL
Cholesterol: 119 mg/dL (ref 0–200)
Total CHOL/HDL Ratio: 3 RATIO
VLDL: 17 mg/dL (ref 0–40)

## 2010-12-17 LAB — COMPREHENSIVE METABOLIC PANEL
AST: 20 U/L (ref 0–37)
Albumin: 4.1 g/dL (ref 3.5–5.2)
Alkaline Phosphatase: 63 U/L (ref 39–117)
BUN: 13 mg/dL (ref 6–23)
GFR calc Af Amer: >60 mL/min (ref >60)
Potassium: 3.9 mEq/L (ref 3.5–5.1)
Total Protein: 6.7 g/dL (ref 6.0–8.3)

## 2010-12-17 LAB — MRSA PCR SCREENING: MRSA by PCR: NEGATIVE

## 2010-12-17 LAB — URINALYSIS, ROUTINE W REFLEX MICROSCOPIC
Bilirubin Urine: NEGATIVE
Glucose, UA: NEGATIVE mg/dL
Nitrite: NEGATIVE
Specific Gravity, Urine: 1.012 (ref 1.005–1.030)
pH: 7 (ref 5.0–8.0)

## 2010-12-17 LAB — CBC
HCT: 37.9 % — ABNORMAL LOW (ref 39.0–52.0)
HCT: 42.5 % (ref 39.0–52.0)
Hemoglobin: 13.6 g/dL (ref 13.0–17.0)
Hemoglobin: 15.1 g/dL (ref 13.0–17.0)
MCHC: 35.5 g/dL (ref 30.0–36.0)
MCV: 89.6 fL (ref 78.0–100.0)
RBC: 4.72 MIL/uL (ref 4.22–5.81)
WBC: 5.3 10*3/uL (ref 4.0–10.5)
WBC: 5.5 10*3/uL (ref 4.0–10.5)

## 2010-12-17 LAB — BASIC METABOLIC PANEL
Calcium: 10.1 mg/dL (ref 8.4–10.5)
GFR calc Af Amer: >60 mL/min (ref >60)
GFR calc non Af Amer: >60 mL/min (ref >60)
Potassium: 3.8 mEq/L (ref 3.5–5.1)
Sodium: 139 mEq/L (ref 135–145)

## 2010-12-17 LAB — APTT: aPTT: 26 seconds (ref 24–37)

## 2010-12-17 LAB — HEMOGLOBIN A1C: Mean Plasma Glucose: 137 mg/dL — ABNORMAL HIGH (ref <117)

## 2010-12-17 LAB — CARDIAC PANEL(CRET KIN+CKTOT+MB+TROPI)
Relative Index: INVALID (ref 0.0–2.5)
Total CK: 74 U/L (ref 7–232)
Troponin I: <0.3 ng/mL (ref <0.30)

## 2010-12-17 LAB — PROTIME-INR
INR: 1.08 (ref 0.00–1.49)
Prothrombin Time: 14.2 seconds (ref 11.6–15.2)

## 2010-12-17 LAB — POCT CARDIAC MARKERS
CKMB, poc: <1 ng/mL — ABNORMAL LOW (ref 1.0–8.0)
Myoglobin, poc: 56.7 ng/mL (ref 12–200)

## 2010-12-17 LAB — SEDIMENTATION RATE: Sed Rate: 0 mm/hr (ref 0–16)

## 2010-12-19 NOTE — H&P (Signed)
NAMEAL, BRACEWELL NO.:  [id number]  MEDICAL RECORD NO.:  [id number]           PATIENT TYPE:  LOCATION:                                 FACILITY:  PHYSICIAN:  Eduard Clos, MDDATE OF BIRTH:  11/25/1940  DATE OF ADMISSION: DATE OF DISCHARGE:                             HISTORY & PHYSICAL   PRIMARY CARE PHYSICIAN:  Dr. Toni Arthurs at [place].  CHIEF COMPLAINT:  Double vision.  HISTORY OF PRESENT ILLNESS:  A 70 year old male with known history of hypertension, hyperlipidemia, who has been treated for chronic right eye herpes simplex infection at [facility], was initially experiencing some headache while sleeping yesterday morning at 3 o'clock which woke him up from sleep.  Headache was mostly in the right side, lasted for half an hour after which the patient went to sleep.  He woke around 4 o'clock again and had his breakfast.  At that time, he was seeing things double. He also had some pain in the right eye.  He went to the ophthalmologist at [facility] who examined him and said that he had some ruptured blood vessel in the right eye which is impinging on the muscles which is causing him to see things double.  At that time, he did not have any headache as his headache has resolved.  He went back home, and by evening 9 o'clock or 9.30 he started having headache again.  It became more intense and excruciating.  Headache was more on the right side and affected his right face and affecting up to his right jaw.  By the time he came to the ER, his headache started improving by 2 o'clock which has resolved. CT head at this time d0 not show anything acute except for a small posterior fossa right-sided mass which is small as per the radiologist, discussed this not showing any mass effect.  An MRI was recommended.  At this time, the patient's symptoms of headache has resolved but he still has diplopia.  The patient has been admitted for further workup including MRI to rule  out any CVA.  The patient is not a candidate for any t-PA as the patient does have more than 3 hours.  The patient denies any dizziness or loss of consciousness.  He has not lost function of lower extremities and has not lost strength of any upper or lower extremities.  The patient has good grip strength and no pronator drift.  No dysdiadochokinesia.  No ataxia or difficulty walking.  He has no difficulty speaking or any difficulty swallowing. He passed his swallow.  The patient denies any chest pain, shortness of breath, nausea, vomiting, abdominal pain, dysuria, discharge, or diarrhea.  PAST MEDICAL HISTORY: 1. History of diabetes, he is off medications, maintained on diet. 2. History of chronic right eye herpes simplex infection. 3. History of hyperlipidemia. 4. Hypertension.  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  MEDICATIONS PRIOR ON ADMISSION: 1. The patient is on acyclovir 400 mg p.o.  b.i.d. since 2008.     Initially, the herpes affected his lips secondary to his right eye. 2. Amlodipine 5 mg p.o. daily. 3. Losartan 100 mg p.o. daily. 4. Gabapentin 600 mg t.i.d. 5. Fenofibrate 160 mg p.o. daily. 6. Simvastatin 20mg  daily. 7. Levocetirizine 5 mg daily. 8. Calcium citrate. 9. He takes Bayer aspirin 81 mg daily. 10.Biotin. 11.Cinnamon. 12.Coenzyme Q. 13.Multivitamins. 14.Salmon oil. 15.Vitamin D.  ALLERGIES:  No known drug allergies.  FAMILY HISTORY:  Significant for coronary disease and stroke.  SOCIAL HISTORY:  The patient does not smoke cigarette, drink alcohol, or use illegal drugs.  He is a full code.  REVIEW OF SYSTEMS:  As per history of present illness nothing else significant.  PHYSICAL EXAMINATION:  GENERAL:  The patient examined at bedside, not in acute distress. VITAL SIGNS:  Blood pressure 135/67, when the  patient came to the ER it was 199/77, pulse 48 per minute, sinus bradycardia, temperature 97.4, respirations 18, O2 sat 97%. HEENT:  Anicteric.  No pallor.  At this time, the patient is able to see in both eyes.  When he opens both his eyes, he has diplopia.  When he sees in only one eye, there is no diplopia.  PLA positive.  No facial asymmetry.  Tongue is midline.  No nuchal rigidity.  There is no localized tenderness in his scalp. CHEST:  Bilateral air entry present.  No rhonchi.  No crepitation. HEART:  S1 and S2 heard. ABDOMEN:  Soft, nontender.  Bowel sounds heard. CNS:  The patient is alert, awake, oriented to time, place, and person. Moves upper and lower extremities 5/5.  There is no pronator drift. There is no dysdiadochokinesia or ataxia. EXTREMITIES:  Peripheral pulses felt.  No edema.  LABORATORY DATA:  EKG shows normal sinus rhythm with sinus bradycardia, beats around 48 beats per minute with nonspecific ST-T changes, first- degree AV block.  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  Consider further evaluation with MRI.  No evidence of acute intracranial hemorrhage or acute infarct, mild diffuse atrophy.  CBC, WBC is 5.3, hemoglobin 13.6, hematocrit is 37.9, platelets 136.  Basic metabolic panel, sodium 139, potassium 3.8, chloride 104, carbon dioxide 26, glucose 158, BUN 15, creatinine 0.93, calcium 10.1, CK-MB less than 1, troponin less than 0.05, myoglobin is 56.7.  ASSESSMENT AND PLAN: 1. Diplopia.  We will add further workup including to rule out     cerebrovascular accident. 2. Uncontrolled hypertension. 3. Sinus brachycardia. 4. 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 5. History of hyperlipidemia. 6. Thrombocytopenia. 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  PLAN: 1. At this  time, we will admit the patient to telemetry. 2. For his diplopia, at this time we will have to rule out any CVA and     also further workup on his posterior cranial small tumor. 3. We are going to place the patient on neuro checks, get MRI of the     brain, MRA of the brain, 2-D echo, carotid Doppler. 4. At this time, I am not going to place the patient on aspirin or any     antiplatelet agents as the patient states that he had a ruptured     right eye blood vessel.  We will need further information on this,     and also based on MRI  and MRA of the brain we will further plan if     the patient needs to be on antiplatelet agents at this time.  If     possible, we will also need to talk to his ophthalmologist before     starting same. 5. Sinus bradycardia.  At this time, the patient is not on any rate-     limiting medications.  We will closely observe in telemetry.  The     patient at this time is asymptomatic with regard to his sinus     bradycardia. 6. Further recommendation based on the test orders and clinical     course.     Eduard Clos, MD     ANK/MEDQ  D:  12/17/2010  T:  12/17/2010  Job:  [id number]  cc:   Dr. Toni Arthurs.  Electronically Signed by Midge Minium MD on 12/19/2010 08:43:59 AM

## 2010-12-20 ENCOUNTER — Telehealth: Payer: Self-pay | Admitting: Cardiovascular Disease

## 2010-12-20 NOTE — Telephone Encounter (Signed)
Pt called said he was in hospital and had a lot of tests He has appt here on may 23 wanted to know if we needed copy of any tests he had done before appt please call and let him know

## 2010-12-20 NOTE — Telephone Encounter (Signed)
Pt called, he was in [place] and all tests were done there and we have access to them.Alfonso Ramus RN

## 2010-12-24 NOTE — Op Note (Signed)
NAME:  [name] [name], [name] [name] NO.:  [id number]   MEDICAL RECORD NO.:  [id number]                   PATIENT TYPE:  AMB   LOCATION:  [facility]                                 FACILITY:  [facility]   PHYSICIAN:  Rozanna Boer., M.D.      DATE OF BIRTH:  04-17-1941   DATE OF PROCEDURE:  12/30/2003  DATE OF DISCHARGE:                                 OPERATIVE REPORT   PREOPERATIVE DIAGNOSIS:  Right distal ureteral stone with obstruction.   POSTOPERATIVE DIAGNOSIS:  Right distal ureteral stone with obstruction.   OPERATION/PROCEDURE:  Right ureteroscopy with stone extraction and insertion  of right ureteral stent.   ANESTHESIA:  General.   SURGEON:  Courtney Paris, M.D.   INDICATIONS:  This is a 70 year old patient who has had pain in the right  lower quadrant at least for two weeks with nonprogression of a 4-5 mm stone  in the right distal ureter.  He enters now for ureteroscopy and possible  holmium laser treatment.  He had previous stone about 20 years ago that was  not recovered.   DESCRIPTION OF PROCEDURE:  The patient was placed on the operating table in  the dorsal lithotomy position.  After satisfactory induction of general  anesthesia, was prepped and draped in the with Betadine, given IV  antibiotics.  A 21 panendoscope was inserted.  No anterior urethral  stricture was seen.  The urethra was mildly obstructing in a bilobar  fashion.  The bladder was carefully inspected.  No bladder tumors seen.  The  right orifice was catheterized with a open-ended #6 ureteral catheter with a  guidewire through the opening and an occlusive retrograde demonstrated the  stone in the distal ureter by about 10-12 cm from the UV junction.  Then  passed a guidewire up past the stone into the level of the kidney under  fluoroscopy and then over the guidewire past a 4 cm ureteral balloon  dilator.  This was inflated to 12 atmospheres and maintained for five timed  minutes.  The balloon dilator was then removed leaving the guidewire in  place as a safety wire.  I passed the #6 short ureteral scope up to and past  the stone.  The ureter was dilated proximally and used the Segura basket to  retrieve the stone, but when I did so, the  stone seemed to crumble into  tiny pieces.  There was a piece of __________ in the bladder.  Seven more  passes with the scope indicated there were no further stones.  He used the  cystoscope and tried to retrieve the pieces from the bladder but again they  seemed to crumble.  Not much was obtained.  Over the guidewire, a 28 cm 6-  French ureteral stent with string attached to the distal end was passed  under fluoroscopy in a satisfactory manner. The guidewire was then removed  and the string brought out through  the penis and taped.  The bladder was  drained and he was given a B&O suppository and some Toradol IV and taken to  the recovery room in good condition.  Later discharged as an outpatient with  detailed written instructions.                                               Rozanna Boer., M.D.    HMK/MEDQ  D:  12/30/2003  T:  12/30/2003  Job:  [id number]

## 2010-12-24 NOTE — Consult Note (Signed)
[name] [name], HANNAN NO.:  [id number]   MEDICAL RECORD NO.:  [id number]          PATIENT TYPE:  AMB   LOCATION:  ENDO                         FACILITY:  [facility]   PHYSICIAN:  [name], M.D. [facility]  DATE OF BIRTH:  1941-03-01   DATE OF CONSULTATION:  03/03/2005  DATE OF DISCHARGE:  02/24/2005                                   CONSULTATION   INDICATIONS:  This 70 year old white male has complaint of refractory cough  and question of gastroesophageal reflux.  He has been on Nexium 40 mg at  bedtime and Zantac 150 mg q.a.m.  His upper endoscopy was essentially  normal.  A 24-hour pH study was done on Meclizine of 40 mg one time a day.   RESULTS:  A two channel catheter passed blindly through nares to the stomach  and was placed 5 cm and 10 cm above the lower obtuse sphincter.  In proximal  channel, the percent of the time pH being less than 4, in upright position  was 2.4%, in recumbent position 0.  Total percent of the time pH being less  than 4 with 1.3% of the time which represents a total of 17 minutes.  The  patient had total of 54 reflux episodes.  All occurred in upright position.  There were no episodes in the recumbent position.  There was no episode  lasting more than 5 minutes, the longest episode lasted 4 minutes and  occurred in upright position.   I the distal channel, 5.6% of the time the pH was less than 4 in upright  position overall and 3% of the time.  Total time pH was below was 40  minutes, 39 minutes out of it was in upright position, and only one minute a  recumbent position.  The patient had a total of 91 reflex episodes, most of  them occurred in upright position, only seven of them in recumbent position.  There was one episode lasting more than 5 minutes, and this was in upright  position.  Composite score in the distal channel was 16.2% which is normal,  normal being less than 22.  In the proximal channel, composite score was  also normal.   Symptom analysis showed that the patient did not have cough in  recumbent position, and his symptoms did not correlate with acid reflux.   IMPRESSION:  This is essentially a negative intraesophageal pH probe with no  significant reflux episodes in recumbent position on Nexium 40 mg daily with  no correlation with patient's symptoms.       DB/MEDQ  D:  03/03/2005  T:  03/03/2005  Job:  [id number]   cc:   Gertie Baron, M.D.  Ear, Nose, Throat Specialist  [place], [place] C. Andrey Campanile, M.D.  [address]  [place]  [place] [address]  Fax: [phone]

## 2010-12-24 NOTE — Assessment & Plan Note (Signed)
[facility]                           GASTROENTEROLOGY OFFICE NOTE   NAME:[name] [name], [name] [name]                          MRN:          [id number]  DATE:02/23/2006                            DOB:          1941/06/12    [name] [name] is a very nice 70 year old gentleman whom we have treated for  gastroesophageal reflux.  He had an upper endoscopy in July, 2006 and has  been on Nexium 40 mg p.o. b.i.d., which completely controls his symptoms.  His 24-hour pH probe was negative.  Last colonoscopy in September, 2005  showed severe diverticulosis of the left colon.  This was confirmed on  recent CT scan of the abdomen and pelvis, which was done for evaluation of  his prostate.  It showed extensive diverticulosis of the left colon.  He has  a positive family history of colon cancer in several relatives.  He is here  today because of discomfort in his suprapubic area and left lower quadrant,  which occurred rather suddenly when he was lifting heavy equipment.  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  The incidences occurred in  November, 2006.  He has had a lot of pain in the low abdomen since then, but  it is getting better.   He has a history of bilateral inguinal hernias which were repaired at the  age of 70 and 60.  He denies any belching or swelling in his groin or left  testicle.  The pain seems to be worse when he evacuates stool or if he  strains while lifting heavy stuff.   MEDICATIONS:  1.  Nexium 40 mg p.o. b.i.d.  2.  Multivitamins.   PHYSICAL EXAMINATION:  VITAL SIGNS:  Blood pressure 130/66, pulse 58, weight  178 pounds.  GENERAL:  He was alert and oriented in no distress.  LUNGS:  Clear to auscultation.  COR:  Rate normal.  Normal S1 and S2.  ABDOMEN:  Soft.  Minimal tenderness at the left lower quadrant at the pelvic  rim.  When I am pushing hard, there is some discomfort.  Left groin is  normal.  Scrotum is not enlarged or swollen.  When  standing up, exam of the  left inguinal and right inguinal ring does not show any evidence of hernia,  especially when coughing.  No edema.  Because of inguinal scars from  previous herniorrhaphy.  Bowel sounds were active.  RECTAL:  Some hemoccult negative stool with __________ prostate.   IMPRESSION:  71.  A 71 year old white male with left lower quadrant discomfort, most      likely related to previous inguinal hernia.  Recent CT scan of the      pelvis does not show any active hernia, but I feel that his pain is      musculoskeletal mostly and that it is slowly getting better.  2.  Severe diverticulosis of the left groin.  No clinical evidence of      diverticulitis.   PLAN:  1.  Increase fiber.  2.  Take Benefiber samples on a daily  basis.  3.  If the pain in the left groin increases or if he develops prolapse into      the groin, we will refer him to a surgeon.                                  Hedwig Morton. Juanda Chance, MD    DMB/MedQ  DD:  02/23/2006  DT:  02/23/2006  Job #:  [id number]   cc:   Quita Skye. Artis Flock, MD

## 2010-12-29 ENCOUNTER — Encounter: Payer: Self-pay | Admitting: Cardiovascular Disease

## 2010-12-29 ENCOUNTER — Ambulatory Visit (INDEPENDENT_AMBULATORY_CARE_PROVIDER_SITE_OTHER): Payer: Medicare Other | Admitting: Cardiovascular Disease

## 2010-12-29 DIAGNOSIS — R079 Chest pain, unspecified: Secondary | ICD-10-CM

## 2010-12-29 NOTE — Assessment & Plan Note (Signed)
[name] [name] continues to have episodes of chest discomfort. I'll see him again in 2-3 months for followup visit. If he has continued have episodes of chest pain that limited his exercise capacity, we'll probably need to go ahead with a cardiac catheterization. I've asked him to call me if his chest pains worsen.

## 2010-12-29 NOTE — Progress Notes (Signed)
Joselyn Glassman Date of Birth  11-Jul-1941 [facility] / [facility] [address]     [address] [place], [place]  [address] [phone]  Fax  [phone]  History of Present Illness:  Mr. Quast is a day elderly gentleman who was seen here last month with episodes of chest pain. We performed a nuclear stress test which was normal. We also performed an echocardiogram which revealed normal left ventricular systolic function. He has felt a little bit better but notes that he has not been challenging himself on the treadmill. He's been taking it easy on the treadmill and does not want to have any further episodes of chest pain.  Current Outpatient Prescriptions on File Prior to Visit  Medication Sig Dispense Refill  . acyclovir (ZOVIRAX) 800 MG tablet Take 800 mg by mouth 2 (two) times daily.        . Alpha-Lipoic Acid (LIPOIC ACID PO) Take by mouth daily.        . Biotin 1000 MCG tablet Take 1,000 mcg by mouth daily.        . calcium-vitamin D (OSCAL WITH D) 250-125 MG-UNIT per tablet Take 1 tablet by mouth daily.        . cholecalciferol (VITAMIN D) 1000 UNITS tablet Take 1,000 Units by mouth daily.        . Cinnamon 500 MG capsule Take 500 mg by mouth daily.        . clonazePAM (KLONOPIN) 0.5 MG tablet Take 0.5 mg by mouth 2 (two) times daily.        . Coenzyme Q10 (COQ10 PO) Take by mouth 2 (two) times daily.        . fenofibrate 160 MG tablet Take 160 mg by mouth daily.        Marland Kitchen FLUoxetine (PROZAC) 20 MG capsule Take 20 mg by mouth daily.        Marland Kitchen gabapentin (NEURONTIN) 100 MG capsule Take 600 mg by mouth 3 (three) times daily.       Marland Kitchen losartan (COZAAR) 100 MG tablet Take 100 mg by mouth daily.        Marland Kitchen Lysine 500 MG CAPS Take 500 mg by mouth daily.        . multivitamin (THERAGRAN) per tablet Take 1 tablet by mouth daily.        . Omega-3 Fatty Acids (SALMON OIL) 200 MG CAPS Take 1 capsule by mouth daily.        . simvastatin (ZOCOR) 20 MG tablet Take 20 mg by  mouth at bedtime.        Marland Kitchen DISCONTD: aspirin 81 MG tablet Take 81 mg by mouth daily.          No Known Allergies  Past Medical History  Diagnosis Date  . Diabetes mellitus   . Hypertension   . Chest pain   . Testicular pain   . Flank pain   . Diabetic neuropathy   . Anxiety   . Hypercholesterolemia   . Asthma   . History of bronchitis     Past Surgical History  Procedure Date  . Cholecystectomy   . Hernia repair     X3    History  Smoking status  . Never Smoker   Smokeless tobacco  . Not on file    History  Alcohol Use No    Family History  Problem Relation Age of Onset  . Stroke      family history  . Coronary  artery disease      family history    Reviw of Systems:  Reviewed in the HPI.  All other systems are negative.  Physical Exam: BP 128/82  Pulse 55  Ht 5\' 8"  (1.727 m)  Wt 179 lb 3.2 oz (81.285 kg)  BMI 27.25 kg/m2 The patient is alert and oriented x 3.  The mood and affect are normal.  The skin is warm and dry.  Color is normal.  The HEENT exam reveals that the sclera are nonicteric.  The mucous membranes are moist.  The carotids are 2+ without bruits.  There is no thyromegaly.  There is no JVD.  The lungs are clear.  The chest wall is non tender.  The heart exam reveals a regular rate with a normal S1 and S2.  There are no murmurs, gallops, or rubs.  The PMI is not displaced.   Abdominal exam reveals good bowel sounds.  There is no guarding or rebound.  There is no hepatosplenomegaly or tenderness.  There are no masses.  Exam of the legs reveal no clubbing, cyanosis, or edema.  The legs are without rashes.  The distal pulses are intact.  Cranial nerves II - XII are intact.  Motor and sensory functions are intact.  The gait is normal.  ECG:  Assessment / Plan:

## 2011-01-03 NOTE — Discharge Summary (Signed)
[name] [name], [name] [name]                   ACCOUNT NO.:  [id number]  MEDICAL RECORD NO.:  [id number]           PATIENT TYPE:  I  LOCATION:  3033                         FACILITY:  [facility]  PHYSICIAN:  Jossilyn Benda, DO         DATE OF BIRTH:  1941-02-21  DATE OF ADMISSION:  12/17/2010 DATE OF DISCHARGE:                              DISCHARGE SUMMARY   ADMISSION DIAGNOSES:  Included 1. Diplopia, concern for cerebrovascular accident. 2. Uncontrolled hypertension. 3. Sinus bradycardia. 4. 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 5. History of hyperlipidemia. 6. Thrombocytopenia. 7. 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  HISTORY OF PRESENT ILLNESS:  Please see H and P.  HOSPITAL COURSE:  The patient was admitted to Neuro Unit.  As the patient had a history of foreign body in his eyes, he was planned for an MRI.  He underwent an x-ray for a foreign body in the eyes and this showed no evidence of metallic foreign body within the orbit.  MRI/MRA of the head was obtained, which demonstrated no acute focal or intracranial abnormality to explain the patient's symptoms, which included diplopia and right-sided head and facial pain.  No posterior fossa mass as had been indicated on CT on admission.  Mild atrophy and white matter disease, and minimal ethmoid sinus disease.  MRA of the head showed normal-variant MRA circle of Willis.  The patient's pain resolved.  He has continued to have diplopia as was predicted by his ophthalmologist whom he visited on Thursday.  Ophthalmologist told him that due to this burst blood vessel in his right, he would continue to have diplopia for weeks, even months.  The patient has had a heart rate that has been extraordinarily slow, at times in the 30s.  He is asymptomatic during these episodes.  The patient tells me that he knows he has a slow heart rate at baseline; however, the patient does take Norvasc for hypertension  and this certainly can exacerbate his problem. This has been stopped.  TSH was checked, but is not back yet.  I do not feel that this is critical to obtain before the patient is discharged, and certainly the patient's primary care physician can follow up on this when the patient visits him on the 29th.  I wanted to get hold of an ophthalmologist that the patient had seen at [facility] yesterday to find out whether or not it was safe to return this patient to the 81 mg of aspirin.  I was unable to do so.  The wife could not provide me with the name or telephone number to get a hold of, so I am taking the patient off the aspirin.  The patient himself states he was only placed on aspirin because of his age, so I will take him off the aspirin until he can speak with his ophthalmologist at [facility] and confirmed that it is okay for him to take it after he has had this burst blood vessel.  The patient will be discharged to home today in good  condition.  DISCHARGE DIAGNOSES:  Included 1. 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  No     cerebrovascular accident.  The diplopia is stable and is expected     to remain for some period of weeks and/or months.  The patient is     to follow up with his ophthalmologist as outpatient in 2-4 weeks     and he is to call on Monday to get an answer about the aspirin. 2. Bradycardia.  Again, a slow heart rate is normal for this patient,     but his heart rate has dropped into the 30s and 40s while he has     been here.  He takes Norvasc for hypertension.  This has been     stopped.  TSH was ordered, but is not back yet.  The patient wishes     to go home.  This is something that the primary care physician can     follow up on, on his visit on the 29th with it, which the patient     states he has scheduled. 3. Hypertension.  The patient's blood pressure has been under good     control; however, we are stopping his Norvasc and adding      hydrochlorothiazide. 4. Hyperlipidemia.  We will continue his simvastatin. 5. 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  Continue     acyclovir. 6. 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     The patient seemed to have difficulty with short-term memory     issues.  This again I will leave for the primary care physician to     follow up on, and again according to the patient's brother, the     patient had a syncopal episode 5-6 years ago, but has had nothing     since.  DISCHARGE INSTRUCTIONS:  Include activity as tolerated.  Diet is cardiac.  Medications at home include 1. Acetaminophen 325 two p.o. q.4 h. as needed for pain. 2. Hydrochlorothiazide 25 mg 1 p.o. daily. 3. Acyclovir 800 mg 1 tablet by mouth twice daily. 4. Biotin over the counter 1 tablet by mouth daily. 5. Calcium citrate 1 tablet by mouth daily. 6. Cinnamon 1 tablet by mouth daily. 7. Coenzyme Q 1 tablet by mouth daily. 8. Fenofibrate 160 mg 1 p.o. daily. 9. Gabapentin 600 mg 1 p.o. t.i.d. 10.Levocetirizine 5 mg 1 p.o. daily. 11.Losartan 100 mg 1 p.o. daily. 12.Multivitamins 1 p.o. daily. 13.Cinnamon oil 1 capsule by mouth daily. 14.Simvastatin 20 mg 1 p.o. daily. 15.Vitamin D3 1 p.o. daily.  The patient is to follow up with his primary care doctor.  He states he has an appointment on the 29th with Dr. Toni Arthurs in [place]. Furthermore, the patient is to follow up with [facility] as directed, and he is to call them on Monday to get an answer about restarting the aspirin 81 mg enteric coated 1 p.o. daily.          ______________________________ Fran Lowes, DO     AS/MEDQ  D:  12/18/2010  T:  12/19/2010  Job:  [id number]  cc:   Dr. Toni Arthurs  Electronically Signed by Fran Lowes DO on 01/03/2011 11:50:00 PM

## 2011-01-06 ENCOUNTER — Emergency Department: Payer: Self-pay | Admitting: Internal Medicine

## 2011-12-08 ENCOUNTER — Ambulatory Visit (INDEPENDENT_AMBULATORY_CARE_PROVIDER_SITE_OTHER): Payer: Medicare Other | Admitting: Family Medicine

## 2011-12-08 ENCOUNTER — Ambulatory Visit (INDEPENDENT_AMBULATORY_CARE_PROVIDER_SITE_OTHER)
Admission: RE | Admit: 2011-12-08 | Discharge: 2011-12-08 | Disposition: A | Discharge status: Home / Self Care | Payer: Medicare Other | Source: Ambulatory Visit | Attending: Family Medicine | Admitting: Family Medicine

## 2011-12-08 ENCOUNTER — Encounter: Payer: Self-pay | Admitting: Family Medicine

## 2011-12-08 VITALS — BP 150/74 | HR 58 | Temp 97.5°F | Ht 67.5 in | Wt 186.0 lb

## 2011-12-08 DIAGNOSIS — M795 Residual foreign body in soft tissue: Secondary | ICD-10-CM

## 2011-12-08 DIAGNOSIS — T1490XA Injury, unspecified, initial encounter: Secondary | ICD-10-CM

## 2011-12-08 DIAGNOSIS — M542 Cervicalgia: Secondary | ICD-10-CM

## 2011-12-08 DIAGNOSIS — G629 Polyneuropathy, unspecified: Secondary | ICD-10-CM

## 2011-12-08 DIAGNOSIS — E119 Type 2 diabetes mellitus without complications: Secondary | ICD-10-CM

## 2011-12-08 DIAGNOSIS — R011 Cardiac murmur, unspecified: Secondary | ICD-10-CM

## 2011-12-08 DIAGNOSIS — E7800 Pure hypercholesterolemia, unspecified: Secondary | ICD-10-CM

## 2011-12-08 DIAGNOSIS — E1149 Type 2 diabetes mellitus with other diabetic neurological complication: Secondary | ICD-10-CM

## 2011-12-08 DIAGNOSIS — IMO0002 Reserved for concepts with insufficient information to code with codable children: Secondary | ICD-10-CM

## 2011-12-08 DIAGNOSIS — R413 Other amnesia: Secondary | ICD-10-CM

## 2011-12-08 DIAGNOSIS — E1142 Type 2 diabetes mellitus with diabetic polyneuropathy: Secondary | ICD-10-CM

## 2011-12-08 DIAGNOSIS — G589 Mononeuropathy, unspecified: Secondary | ICD-10-CM

## 2011-12-08 NOTE — Patient Instructions (Signed)
Xray on the way out.  We'll contact you with your lab/xray report.  Come back for fasting labs in about 1 week.  Plan on a 6 month follow up.  See Shirlee Limerick or Asher Muir about your referral before you leave today. Take care.

## 2011-12-08 NOTE — Progress Notes (Signed)
New patient.   Diabetes:  Using medications without difficulties:yes Hypoglycemic episodes:no, not checked.  Hyperglycemic episodes:no, not checked.  Feet problems: burning in feet, better with gabapentin Blood Sugars averaging: not checked.   Memory changes.  Possible changes, no red flag events.  Discussed.    FB in L hand, possible.  May have a bit of glass near 5th MCP from prev injury.  Asking about options.    Neck pain, pain with ROM, to the L.  No trauma.  No meningismus.  Chronic.  Asking about options.   PMH and SH reviewed  Meds, vitals, and allergies reviewed.   ROS: See HPI.  Otherwise negative.    GEN: nad, alert and oriented HEENT: mucous membranes moist NECK: supple w/o LA, pain with Rotation but no midline pain CV: rrr. Soft SEM noted.  PULM: ctab, no inc wob ABD: soft, +bs EXT: no edema SKIN: no acute rash  Diabetic foot exam: Normal inspection No skin breakdown No calluses  Normal DP pulses Normal sensation to light touch and monofilament Nails normal

## 2011-12-12 DIAGNOSIS — IMO0002 Reserved for concepts with insufficient information to code with codable children: Secondary | ICD-10-CM | POA: Insufficient documentation

## 2011-12-12 DIAGNOSIS — R413 Other amnesia: Secondary | ICD-10-CM | POA: Insufficient documentation

## 2011-12-12 DIAGNOSIS — E1149 Type 2 diabetes mellitus with other diabetic neurological complication: Secondary | ICD-10-CM | POA: Insufficient documentation

## 2011-12-12 DIAGNOSIS — M542 Cervicalgia: Secondary | ICD-10-CM | POA: Insufficient documentation

## 2011-12-12 NOTE — Assessment & Plan Note (Signed)
Continue meds, return for labs.  D/w pt about meds and diet.

## 2011-12-12 NOTE — Assessment & Plan Note (Signed)
No fx, would be reasonable for PT vs chiropractor, he elects for the latter. Will send films to patient.

## 2011-12-12 NOTE — Assessment & Plan Note (Signed)
Refer to ortho.

## 2011-12-12 NOTE — Assessment & Plan Note (Signed)
Stable by exam, prev echo done.

## 2011-12-12 NOTE — Assessment & Plan Note (Signed)
Statin intolerant 

## 2011-12-12 NOTE — Assessment & Plan Note (Signed)
30/30 MMSE 12/2011.  I doubt sig pathology at this point.  Will follow.

## 2011-12-15 ENCOUNTER — Other Ambulatory Visit (INDEPENDENT_AMBULATORY_CARE_PROVIDER_SITE_OTHER): Payer: Medicare Other

## 2011-12-15 DIAGNOSIS — E119 Type 2 diabetes mellitus without complications: Secondary | ICD-10-CM

## 2011-12-15 DIAGNOSIS — G589 Mononeuropathy, unspecified: Secondary | ICD-10-CM

## 2011-12-15 DIAGNOSIS — Z79899 Other long term (current) drug therapy: Secondary | ICD-10-CM

## 2011-12-15 DIAGNOSIS — G629 Polyneuropathy, unspecified: Secondary | ICD-10-CM

## 2011-12-15 LAB — COMPREHENSIVE METABOLIC PANEL
ALT: 30 U/L (ref 0–53)
AST: 26 U/L (ref 0–37)
Albumin: 4.2 g/dL (ref 3.5–5.2)
BUN: 12 mg/dL (ref 6–23)
Calcium: 9.4 mg/dL (ref 8.4–10.5)
Chloride: 107 mEq/L (ref 96–112)
Potassium: 4.2 mEq/L (ref 3.5–5.1)
Sodium: 141 mEq/L (ref 135–145)
Total Protein: 6.9 g/dL (ref 6.0–8.3)

## 2011-12-15 LAB — HEMOGLOBIN A1C: Hgb A1c MFr Bld: 6.7 % — ABNORMAL HIGH (ref 4.6–6.5)

## 2011-12-15 LAB — LIPID PANEL
Cholesterol: 143 mg/dL (ref 0–200)
LDL Cholesterol: 80 mg/dL (ref 0–99)

## 2011-12-16 ENCOUNTER — Encounter: Payer: Self-pay | Admitting: *Deleted

## 2011-12-19 ENCOUNTER — Telehealth: Payer: Self-pay

## 2011-12-19 NOTE — Telephone Encounter (Signed)
Pt daughter, Clydie Braun called when pt seen 12/08/11 nurse was told pt taking Gabapentin 300 mg three times a day. Clydie Braun called to update how pt taking Gabapentin. Dr Al Corpus changed to Gabapentin 600 mg taking one tab three times a day. Pt does not need refills just wanted chart updated. Med list updated.

## 2012-02-29 ENCOUNTER — Other Ambulatory Visit: Payer: Self-pay | Admitting: Family Medicine

## 2012-02-29 NOTE — Telephone Encounter (Signed)
Left message on answering machine to call back.

## 2012-02-29 NOTE — Telephone Encounter (Signed)
Pt is needing refill on his Potassium he wants to use [facility] at [facility].

## 2012-02-29 NOTE — Telephone Encounter (Signed)
Clarify this with patient.  We don't have potassium listed on meds or in med history.

## 2012-03-01 NOTE — Telephone Encounter (Signed)
Spoke to pharmacy.  Patient has no record of ever having K+ filled.  I asked if Cozaar is listed as Losartan Potassium and she said "yes".  I am wondering if this is what the patient is seeing on his bottle and is really requesting Losartan.  LMOVM of patient's home phone asking for his daughter to return the call.

## 2012-03-01 NOTE — Telephone Encounter (Signed)
Daughter says he has been taking Potassium for a long time and it was on his list when they last came in.

## 2012-03-01 NOTE — Telephone Encounter (Signed)
Please talk to me about this patient's med list.

## 2012-03-02 ENCOUNTER — Other Ambulatory Visit: Payer: Self-pay | Admitting: *Deleted

## 2012-03-02 MED ORDER — LOSARTAN POTASSIUM 100 MG PO TABS: 100.0000 mg | ORAL_TABLET | Freq: Every day | ORAL | Status: DC

## 2012-03-02 NOTE — Telephone Encounter (Signed)
Patient states it is Losartan and apologizes for the confusion.  Medication sent to pharmacy.

## 2012-05-28 ENCOUNTER — Encounter: Payer: Self-pay | Admitting: Family Medicine

## 2012-05-28 ENCOUNTER — Ambulatory Visit (INDEPENDENT_AMBULATORY_CARE_PROVIDER_SITE_OTHER): Payer: Medicare Other | Admitting: Family Medicine

## 2012-05-28 VITALS — BP 166/84 | HR 88 | Temp 98.3°F | Wt 179.8 lb

## 2012-05-28 DIAGNOSIS — R059 Cough, unspecified: Secondary | ICD-10-CM

## 2012-05-28 DIAGNOSIS — R05 Cough: Secondary | ICD-10-CM

## 2012-05-28 MED ORDER — AZITHROMYCIN 250 MG PO TABS: ORAL_TABLET | ORAL | Status: DC

## 2012-05-28 NOTE — Progress Notes (Signed)
Cough, going on for weeks now.  He'll have some improvement and the relapse.  No fevers.  Some sputum, brown, occ.  No wheeze.  Nonsmoker.  He can get a deep breath but it can occ trigger a cough.  No nasal congestion.  No facial pain but he's had some HA.  More aches than typical, esp when he has a relapse. Taking tylenol, mints and robitussin for the cough.  "I've never had a cold like this."  Voice is altered.    Meds, vitals, and allergies reviewed.   ROS: See HPI.  Otherwise, noncontributory.  GEN: nad, alert and oriented HEENT: mucous membranes moist, tm w/o erythema, nasal exam w/o erythema, no discharge noted,  OP with mild cobblestoning NECK: supple w/o LA CV: rrr.   PULM: coarse BS B but o/w ctab, no inc wob. Dry cough noted.  EXT: no edema SKIN: no acute rash

## 2012-05-28 NOTE — Patient Instructions (Addendum)
Start the antibiotics today and keep taking the cough medicine.  Ask for the 06/11/12 visit to get changed to a physical.  They may need to change the appointment time that day, but we should still be able to do it on the 4th.  Labs ahead of physical.   Take care.

## 2012-05-29 DIAGNOSIS — R059 Cough, unspecified: Secondary | ICD-10-CM | POA: Insufficient documentation

## 2012-05-29 DIAGNOSIS — R05 Cough: Secondary | ICD-10-CM | POA: Insufficient documentation

## 2012-05-29 NOTE — Assessment & Plan Note (Addendum)
With intermittent cough, wax/wane of sx with sputum production.  At this point, I would treat him for an atypical infection/bronchitis and have him f/u as needed.  Nontoxic, okay for outpatient f/u.  He agrees.

## 2012-05-30 ENCOUNTER — Other Ambulatory Visit: Payer: Self-pay | Admitting: Family Medicine

## 2012-05-30 DIAGNOSIS — Z125 Encounter for screening for malignant neoplasm of prostate: Secondary | ICD-10-CM

## 2012-05-30 DIAGNOSIS — E119 Type 2 diabetes mellitus without complications: Secondary | ICD-10-CM

## 2012-06-04 ENCOUNTER — Other Ambulatory Visit (INDEPENDENT_AMBULATORY_CARE_PROVIDER_SITE_OTHER): Payer: Medicare Other

## 2012-06-04 DIAGNOSIS — E119 Type 2 diabetes mellitus without complications: Secondary | ICD-10-CM

## 2012-06-04 DIAGNOSIS — Z125 Encounter for screening for malignant neoplasm of prostate: Secondary | ICD-10-CM

## 2012-06-04 LAB — HEMOGLOBIN A1C: Hgb A1c MFr Bld: 7.4 % — ABNORMAL HIGH (ref 4.6–6.5)

## 2012-06-04 LAB — COMPREHENSIVE METABOLIC PANEL
Alkaline Phosphatase: 58 U/L (ref 39–117)
BUN: 14 mg/dL (ref 6–23)
CO2: 28 mEq/L (ref 19–32)
Creatinine, Ser: 1 mg/dL (ref 0.4–1.5)
GFR: 79.92 mL/min (ref >60.00)
Glucose, Bld: 186 mg/dL — ABNORMAL HIGH (ref 70–99)
Sodium: 139 mEq/L (ref 135–145)
Total Bilirubin: 0.7 mg/dL (ref 0.3–1.2)

## 2012-06-04 LAB — PSA, MEDICARE: PSA: 0.32 ng/ml (ref 0.10–4.00)

## 2012-06-04 LAB — LIPID PANEL
HDL: 24.2 mg/dL — ABNORMAL LOW (ref >39.00)
Triglycerides: 137 mg/dL (ref 0.0–149.0)
VLDL: 27.4 mg/dL (ref 0.0–40.0)

## 2012-06-11 ENCOUNTER — Ambulatory Visit: Payer: Medicare Other | Admitting: Family Medicine

## 2012-06-11 ENCOUNTER — Encounter: Payer: Self-pay | Admitting: Family Medicine

## 2012-06-11 ENCOUNTER — Ambulatory Visit (INDEPENDENT_AMBULATORY_CARE_PROVIDER_SITE_OTHER): Payer: Medicare Other | Admitting: Family Medicine

## 2012-06-11 VITALS — BP 140/74 | HR 72 | Temp 98.0°F | Ht 68.0 in | Wt 175.5 lb

## 2012-06-11 DIAGNOSIS — Z0000 Encounter for general adult medical examination without abnormal findings: Secondary | ICD-10-CM

## 2012-06-11 DIAGNOSIS — E1149 Type 2 diabetes mellitus with other diabetic neurological complication: Secondary | ICD-10-CM

## 2012-06-11 DIAGNOSIS — E1142 Type 2 diabetes mellitus with diabetic polyneuropathy: Secondary | ICD-10-CM

## 2012-06-11 DIAGNOSIS — Z23 Encounter for immunization: Secondary | ICD-10-CM

## 2012-06-11 DIAGNOSIS — E119 Type 2 diabetes mellitus without complications: Secondary | ICD-10-CM

## 2012-06-11 NOTE — Patient Instructions (Addendum)
Ask the eye clinic if they are okay with your getting the shingles shot.   Keep working on your sugar and recheck A1c in 3 months.  Come see me after that.  Take care.

## 2012-06-12 ENCOUNTER — Encounter: Payer: Self-pay | Admitting: Family Medicine

## 2012-06-12 DIAGNOSIS — Z0000 Encounter for general adult medical examination without abnormal findings: Secondary | ICD-10-CM | POA: Insufficient documentation

## 2012-06-12 NOTE — Progress Notes (Signed)
I have personally reviewed the Medicare Annual Wellness questionnaire and have noted 1. The patient's medical and social history 2. Their use of alcohol, tobacco or illicit drugs 3. Their current medications and supplements 4. The patient's functional ability including ADL's, fall risks, home safety risks and hearing or visual             impairment. 5. Diet and physical activities 6. Evidence for depression or mood disorders  The patients weight, height, BMI have been recorded in the chart and visual acuity is per eye clinic.  I have made referrals, counseling and provided education to the patient based review of the above and I have provided the pt with a written personalized care plan for preventive services.  See scanned forms.  Routine anticipatory guidance given to patient.  See health maintenance. Tetanus 2010 Flu 2013 Shingles- he'll check with his other MDs about getting this PNA 2013 Colonoscopy 2011, 10 year f/u Prostate cancer screening- PSA wnl.  Advance directive- d/w pt about getting a living will.  Normal memory on brief testing today as part of AMW  Diabetes:  Using medications without difficulties:yes Hypoglycemic episodes: no sx Hyperglycemic episodes: no sx Feet problems: some improvement with meds, some dec in burning in feet Blood Sugars averaging: not checked eye exam within last year: yes  PMH and SH reviewed  Meds, vitals, and allergies reviewed.   ROS: See HPI.  Otherwise negative.    GEN: nad, alert and oriented HEENT: mucous membranes moist NECK: supple w/o LA CV: rrr. PULM: ctab, no inc wob ABD: soft, +bs EXT: no edema SKIN: no acute rash  Diabetic foot exam: Normal inspection No skin breakdown No calluses  Normal DP pulses Slight dec in sensation to light touch and monofilament Nails normal

## 2012-06-12 NOTE — Assessment & Plan Note (Signed)
See scanned forms. Routine anticipatory guidance given to patient. See health maintenance.  Tetanus 2010  Flu 2013  Shingles- he'll check with his other MDs about getting this  PNA 2013  Colonoscopy 2011, 10 year f/u  Prostate cancer screening- PSA wnl.  Advance directive- d/w pt about getting a living will.  Normal memory on brief testing today as part of AMW

## 2012-06-12 NOTE — Assessment & Plan Note (Signed)
Labs d/w pt.  Needs to work on diet/weight.  Recheck A1c in 3 months, was up to >7 today.  He agrees.  No changes in meds.

## 2012-06-16 ENCOUNTER — Encounter: Payer: Self-pay | Admitting: Family Medicine

## 2012-06-16 DIAGNOSIS — M810 Age-related osteoporosis without current pathological fracture: Secondary | ICD-10-CM | POA: Insufficient documentation

## 2012-08-09 ENCOUNTER — Telehealth: Payer: Self-pay

## 2012-08-09 NOTE — Telephone Encounter (Signed)
Pt left v/m re; billing question. Left v/m on pts home # to contact [phone] for billing questions.

## 2012-10-19 ENCOUNTER — Other Ambulatory Visit: Payer: Self-pay | Admitting: Family Medicine

## 2012-11-19 ENCOUNTER — Other Ambulatory Visit: Payer: Self-pay | Admitting: Family Medicine

## 2012-12-25 ENCOUNTER — Telehealth: Payer: Self-pay | Admitting: *Deleted

## 2012-12-25 NOTE — Telephone Encounter (Signed)
Letter mailed

## 2012-12-25 NOTE — Telephone Encounter (Signed)
Message copied by Annamarie Major on Tue Dec 25, 2012  6:27 PM ------      Message from: Annamarie Major      Created: Thu Dec 20, 2012  6:08 PM       Left detailed message with wife.      ----- Message -----         From: Joaquim Nam, MD         Sent: 12/20/2012   9:43 AM           To: Annamarie Major, CMA            Schedule pt.  Due for f/u A1c and DM2 check.  Thanks.         ------

## 2013-01-18 ENCOUNTER — Other Ambulatory Visit (INDEPENDENT_AMBULATORY_CARE_PROVIDER_SITE_OTHER): Payer: Medicare Other

## 2013-01-18 DIAGNOSIS — E119 Type 2 diabetes mellitus without complications: Secondary | ICD-10-CM

## 2013-01-18 LAB — HEMOGLOBIN A1C: Hgb A1c MFr Bld: 7 % — ABNORMAL HIGH (ref 4.6–6.5)

## 2013-01-20 ENCOUNTER — Other Ambulatory Visit: Payer: Self-pay | Admitting: Family Medicine

## 2013-01-21 ENCOUNTER — Other Ambulatory Visit: Payer: Medicare Other

## 2013-01-21 NOTE — Telephone Encounter (Signed)
Electronic refill request.  Please advise. 

## 2013-01-22 ENCOUNTER — Telehealth: Payer: Self-pay

## 2013-01-22 NOTE — Telephone Encounter (Signed)
Pt came by to ck on refill status of gabapentin; pt is out of med. Pt will ck with pharmacy later today otherwise will cb; reviewed refill policy with pt and he voiced understanding.See 01/20/13 refill request.  

## 2013-01-22 NOTE — Telephone Encounter (Signed)
Pt came by to ck on refill status of gabapentin; pt is out of med. Pt will ck with pharmacy later today otherwise will cb; reviewed refill policy with pt and he voiced understanding.See 01/20/13 refill request.

## 2013-01-22 NOTE — Telephone Encounter (Signed)
Sent!

## 2013-01-24 ENCOUNTER — Ambulatory Visit (INDEPENDENT_AMBULATORY_CARE_PROVIDER_SITE_OTHER): Payer: Medicare Other | Admitting: Family Medicine

## 2013-01-24 ENCOUNTER — Encounter: Payer: Self-pay | Admitting: Family Medicine

## 2013-01-24 VITALS — BP 148/76 | HR 69 | Temp 98.0°F | Wt 175.5 lb

## 2013-01-24 DIAGNOSIS — E1142 Type 2 diabetes mellitus with diabetic polyneuropathy: Secondary | ICD-10-CM

## 2013-01-24 DIAGNOSIS — E1149 Type 2 diabetes mellitus with other diabetic neurological complication: Secondary | ICD-10-CM

## 2013-01-24 NOTE — Patient Instructions (Addendum)
Take care. Recheck in about 6 months at a physical.  Labs ahead of time.

## 2013-01-24 NOTE — Progress Notes (Signed)
He recently had some burning in his torso (the skin was also sensitive) bilaterally, he attributed it to taking acyclovir but this isn't clearly causal.  If resolved in the meantime.  No rash.  His acyclovir rx had come through the ophthalmology clinic and he'll discuss with them.  His sx are resolved in the meantime.   Diabetes:  Using medications without difficulties:no Hypoglycemic episodes: very rare Hyperglycemic episodes:not checked Feet problems:no Blood Sugars averaging:not checked a1c improved to 7. Working on diet and exercise.    Meds, vitals, and allergies reviewed.   ROS: See HPI.  Otherwise negative.    GEN: nad, alert and oriented HEENT: mucous membranes moist NECK: supple w/o LA CV: rrr. Faint murmur noted.  PULM: ctab, no inc wob ABD: soft, +bs EXT: no edema SKIN: no acute rash  Diabetic foot exam: Normal inspection No skin breakdown No calluses  Normal DP pulses Normal sensation to light touch and monofilament Nails normal

## 2013-01-25 NOTE — Assessment & Plan Note (Signed)
a1c improved to 7. Working on diet and exercise.   Recheck in about 6 months.  No change in meds.

## 2013-03-23 ENCOUNTER — Other Ambulatory Visit: Payer: Self-pay | Admitting: Family Medicine

## 2013-04-24 ENCOUNTER — Other Ambulatory Visit (INDEPENDENT_AMBULATORY_CARE_PROVIDER_SITE_OTHER): Payer: Medicare Other

## 2013-04-24 ENCOUNTER — Ambulatory Visit (INDEPENDENT_AMBULATORY_CARE_PROVIDER_SITE_OTHER): Payer: Medicare Other | Admitting: Gastroenterology

## 2013-04-24 ENCOUNTER — Encounter: Payer: Self-pay | Admitting: *Deleted

## 2013-04-24 ENCOUNTER — Telehealth: Payer: Self-pay | Admitting: Internal Medicine

## 2013-04-24 VITALS — BP 110/80 | HR 66 | Ht 67.0 in | Wt 174.2 lb

## 2013-04-24 DIAGNOSIS — R102 Pelvic and perineal pain unspecified side: Secondary | ICD-10-CM

## 2013-04-24 DIAGNOSIS — R1030 Lower abdominal pain, unspecified: Secondary | ICD-10-CM | POA: Insufficient documentation

## 2013-04-24 DIAGNOSIS — R109 Unspecified abdominal pain: Secondary | ICD-10-CM

## 2013-04-24 LAB — URINALYSIS, ROUTINE W REFLEX MICROSCOPIC
Ketones, ur: NEGATIVE
Leukocytes, UA: NEGATIVE
RBC / HPF: NONE SEEN (ref 0–?)
Specific Gravity, Urine: 1.025 (ref 1.000–1.030)
Urine Glucose: NEGATIVE
Urobilinogen, UA: 0.2 (ref 0.0–1.0)
pH: 6 (ref 5.0–8.0)

## 2013-04-24 LAB — BASIC METABOLIC PANEL
CO2: 28 mEq/L (ref 19–32)
Calcium: 9.8 mg/dL (ref 8.4–10.5)
Glucose, Bld: 101 mg/dL — ABNORMAL HIGH (ref 70–99)
Potassium: 4 mEq/L (ref 3.5–5.1)
Sodium: 138 mEq/L (ref 135–145)

## 2013-04-24 LAB — CBC WITH DIFFERENTIAL/PLATELET
Basophils Absolute: 0 10*3/uL (ref 0.0–0.1)
Eosinophils Relative: 1.3 % (ref 0.0–5.0)
HCT: 43.3 % (ref 39.0–52.0)
Lymphocytes Relative: 41.7 % (ref 12.0–46.0)
Lymphs Abs: 2.9 10*3/uL (ref 0.7–4.0)
Monocytes Relative: 7.4 % (ref 3.0–12.0)
Neutrophils Relative %: 49 % (ref 43.0–77.0)
Platelets: 171 10*3/uL (ref 150.0–400.0)
RDW: 13 % (ref 11.5–14.6)
WBC: 7.1 10*3/uL (ref 4.5–10.5)

## 2013-04-24 MED ORDER — OMEPRAZOLE 40 MG PO CPDR: 40.0000 mg | DELAYED_RELEASE_CAPSULE | Freq: Every day | ORAL | Status: DC

## 2013-04-24 NOTE — Patient Instructions (Addendum)
We have sent the following medications to your pharmacy for you to pick up at your convenience: Omeprazole 40 mg daily  Your physician has requested that you go to the basement for the following lab work before leaving today: CBC, BMET, Urinalysis  You have been scheduled for a CT scan of the abdomen and pelvis at [facility] ([address]---this is in the same building as Architectural technologist).   You are scheduled on Thursday 04/25/13 at 3:00 pm. You should arrive 15 minutes prior to your appointment time for registration. Please follow the written instructions below on the day of your exam:  WARNING: IF YOU ARE ALLERGIC TO IODINE/X-RAY DYE, PLEASE NOTIFY RADIOLOGY IMMEDIATELY AT [phone]! YOU WILL BE GIVEN A 13 HOUR PREMEDICATION PREP.  1) Do not eat or drink anything after 11:00 am (4 hours prior to your test) 2) You have been given 2 bottles of oral contrast to drink. The solution may taste better if refrigerated, but do NOT add ice or any other liquid to this solution. Shake well before drinking.    Drink 1 bottle of contrast @ 1:00 pm (2 hours prior to your exam)  Drink 1 bottle of contrast @ 2:00 pm (1 hour prior to your exam)  You may take any medications as prescribed with a small amount of water except for the following: Metformin, Glucophage, Glucovance, Avandamet, Riomet, Fortamet, Actoplus Met, Janumet, Glumetza or Metaglip. The above medications must be held the day of the exam AND 48 hours after the exam.  The purpose of you drinking the oral contrast is to aid in the visualization of your intestinal tract. The contrast solution may cause some diarrhea. Before your exam is started, you will be given a small amount of fluid to drink. Depending on your individual set of symptoms, you may also receive an intravenous injection of x-ray contrast/dye. Plan on being at [facility] for 30 minutes or long, depending on the type of exam you are having  performed.  This test typically takes 30-45 minutes to complete.  If you have any questions regarding your exam or if you need to reschedule, you may call the CT department at [phone] between the hours of 8:00 am and 5:00 pm, Monday-Friday.  ________________________________________________________________________

## 2013-04-24 NOTE — Telephone Encounter (Signed)
Patient states he took valtrex about a month ago and he started having stomach problems. Reports burning and pain in stomach. Worse at night. He is not taking any PPI at this time. Scheduled with Doug Sou, PA today at 11:00 AM.

## 2013-04-24 NOTE — Progress Notes (Signed)
Reviewed, agree with CT scan

## 2013-04-24 NOTE — Progress Notes (Signed)
04/24/2013 [name] [name] [id number] 07-01-1941   HISTORY OF PRESENT ILLNESS:  Patient is a pleasant 72 year old male who is a patient of Dr. Regino Schultze.  He comes to the office today with complaints of abdominal pain and burning.  He has a somewhat complicated story and I think that he is trying to tie all of his symptoms together that have occurred over the past 3 months.  He says that all of these issues began about 3 months ago when he started taking acyclovir for an oral herpetic infection.  After taking the medication for one day he started having burning all throughout his abdomen.  The burning persisted for some time, however, he did not take any more of the acyclovir.  Since then he has also developed a sharp pain in the LLQ/left pelvis and groin.  Also having trouble urinating.  Last colonoscopy in 09/2009 showed only moderate diverticulosis but was otherwise normal with repeat recommended in 10 years from that time.  Has not taken anything for his symptoms.  No new issues with bowel habits including dark or bloody stool.  No fevers or chills.   Past Medical History  Diagnosis Date  . Diabetes mellitus   . Hypertension   . Chest pain   . Flank pain   . Diabetic neuropathy   . Anxiety   . Hypercholesterolemia   . History of bronchitis   . History of chicken pox   . Heart murmur   . Kidney stones   . 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 Diverticulosis   . Osteoarthritis    Past Surgical History  Procedure Laterality Date  . Hernia repair      X3  . Cholecystectomy  1983    reports that he has never smoked. He has never used smokeless tobacco. He reports that he does not drink alcohol or use illicit drugs. family history includes Colon cancer in his maternal grandmother; Heart disease in his father; Parkinsonism in his maternal grandfather; Stroke in his mother. There is no history of Prostate cancer. Allergies  Allergen Reactions  . Aspirin     bleeding  .  Statins     Muscle pain      Outpatient Encounter Prescriptions as of 04/24/2013  Medication Sig Dispense Refill  . calcium-vitamin D (OSCAL WITH D) 250-125 MG-UNIT per tablet Take 1 tablet by mouth daily.        . fenofibrate 160 MG tablet TAKE 1 TABLET BY MOUTH AT BEDTIME  90 tablet  3  . gabapentin (NEURONTIN) 600 MG tablet TAKE 1 TABLET BY MOUTH 3 TIMES A DAY  270 tablet  3  . losartan (COZAAR) 100 MG tablet TAKE 1 TABLET (100 MG TOTAL) BY MOUTH DAILY.  30 tablet  7  . metFORMIN (GLUCOPHAGE) 500 MG tablet TAKE 1 TABLET BY MOUTH 2 TIMES A DAY  60 tablet  6  . acyclovir (ZOVIRAX) 400 MG tablet Take 400 mg by mouth 2 (two) times daily.      Marland Kitchen omeprazole (PRILOSEC) 40 MG capsule Take 1 capsule (40 mg total) by mouth daily.  30 capsule  0  . [DISCONTINUED] DULoxetine (CYMBALTA) 60 MG capsule Take 60 mg by mouth daily.       No facility-administered encounter medications on file as of 04/24/2013.     REVIEW OF SYSTEMS  : All other systems reviewed and negative except where noted in the History of Present Illness.   PHYSICAL EXAM:  BP 110/80  Pulse 66  Ht 5\' 7"  (1.702 m)  Wt 174 lb 3.2 oz (79.017 kg)  BMI 27.28 kg/m2 General: Well developed white male in no acute distress Head: Normocephalic and atraumatic Eyes:  sclerae anicteric, conjunctiva pink. Ears: Normal auditory acuity Lungs: Clear throughout to auscultation Heart: Regular rate and rhythm Abdomen: Soft, non-distended.  BS present.  Tender on exam in lower abdomen and pelvis. Musculoskeletal: Symmetrical with no gross deformities  Skin: No lesions on visible extremities Extremities: No edema.  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  Neurological: Alert oriented x 4, grossly nonfocal Psychological:  Alert and cooperative. Normal mood and affect  ASSESSMENT AND PLAN: -Abdominal pain:  Patient has a few different areas that are bothersome and painful.  I am unsure if they are related.  ? If lower abdominal pain/pelvic  pain is urinary (or prostate) related (with other urinary symptoms present as well) vs hernia vs other etiology.  Does not seem like diverticulitis.  I am going to check at CT scan of the abdomen and pelvis with contrast.  Will check a CBC, BMP, and UA with micro.  Also, in the interim, he will begin taking omeprazole 40 mg daily for the abdominal burning.  Doubt that any endoscopic procedure would be beneficial in this case.

## 2013-04-25 ENCOUNTER — Ambulatory Visit (INDEPENDENT_AMBULATORY_CARE_PROVIDER_SITE_OTHER)
Admission: RE | Admit: 2013-04-25 | Discharge: 2013-04-25 | Disposition: A | Discharge status: Home / Self Care | Payer: Medicare Other | Source: Ambulatory Visit | Attending: Gastroenterology | Admitting: Gastroenterology

## 2013-04-25 DIAGNOSIS — R109 Unspecified abdominal pain: Secondary | ICD-10-CM

## 2013-04-25 DIAGNOSIS — R102 Pelvic and perineal pain: Secondary | ICD-10-CM

## 2013-04-25 MED ORDER — IOHEXOL 300 MG/ML  SOLN
100.0000 mL | Freq: Once | INTRAMUSCULAR | Status: AC | PRN
  Administered 2013-04-25: 100 mL via INTRAVENOUS

## 2013-05-20 ENCOUNTER — Other Ambulatory Visit: Payer: Self-pay | Admitting: Gastroenterology

## 2013-06-30 ENCOUNTER — Other Ambulatory Visit: Payer: Self-pay | Admitting: Family Medicine

## 2013-06-30 DIAGNOSIS — E1149 Type 2 diabetes mellitus with other diabetic neurological complication: Secondary | ICD-10-CM

## 2013-07-01 ENCOUNTER — Other Ambulatory Visit (INDEPENDENT_AMBULATORY_CARE_PROVIDER_SITE_OTHER): Payer: Medicare Other

## 2013-07-01 ENCOUNTER — Encounter: Payer: Self-pay | Admitting: Internal Medicine

## 2013-07-01 ENCOUNTER — Ambulatory Visit (INDEPENDENT_AMBULATORY_CARE_PROVIDER_SITE_OTHER): Payer: Medicare Other | Admitting: Internal Medicine

## 2013-07-01 VITALS — BP 128/62 | HR 80 | Ht 67.0 in | Wt 179.1 lb

## 2013-07-01 DIAGNOSIS — R1031 Right lower quadrant pain: Secondary | ICD-10-CM

## 2013-07-01 DIAGNOSIS — N4289 Other specified disorders of prostate: Secondary | ICD-10-CM

## 2013-07-01 DIAGNOSIS — N4281 Prostatodynia syndrome: Secondary | ICD-10-CM

## 2013-07-01 LAB — PSA: PSA: 0.27 ng/mL (ref 0.10–4.00)

## 2013-07-01 LAB — URINALYSIS, ROUTINE W REFLEX MICROSCOPIC
Hgb urine dipstick: NEGATIVE
Leukocytes, UA: NEGATIVE
Nitrite: NEGATIVE
RBC / HPF: NONE SEEN (ref 0–?)
Specific Gravity, Urine: 1.02 (ref 1.000–1.030)
Total Protein, Urine: NEGATIVE
pH: 6.5 (ref 5.0–8.0)

## 2013-07-01 MED ORDER — MOVIPREP 100 G PO SOLR: 1.0000 | Freq: Once | ORAL | Status: DC

## 2013-07-01 MED ORDER — HYOSCYAMINE SULFATE 0.125 MG SL SUBL: 0.1250 mg | SUBLINGUAL_TABLET | SUBLINGUAL | Status: DC | PRN

## 2013-07-01 MED ORDER — CIPROFLOXACIN HCL 250 MG PO TABS: 250.0000 mg | ORAL_TABLET | Freq: Two times a day (BID) | ORAL | Status: DC

## 2013-07-01 NOTE — Patient Instructions (Addendum)
You have been scheduled for a colonoscopy with propofol. Please follow written instructions given to you at your visit today.  Please pick up your prep kit at the pharmacy within the next 1-3 days. If you use inhalers (even only as needed), please bring them with you on the day of your procedure. Your physician has requested that you go to www.[email or web address].[email or web address] and enter the access code given to you at your visit today. This web site gives a general overview about your procedure. However, you should still follow specific instructions given to you by our office regarding your preparation for the procedure.  We have sent the following medications to your pharmacy for you to pick up at your convenience: Levsin Cipro  Your physician has requested that you go to the basement for the following lab work before leaving today: PSA, Urinalysis, Urine culture  You have been scheduled to see Dr Jacquelyne Balint at [facility]. Your appointment is on 07/15/13 at 12:45 pm. Please arrive at 12:30 pm for registration. You should be receiving a new patient packet in the mail. If you do not, please call their office at [phone].  CC:Dr Crawford Givens

## 2013-07-01 NOTE — Progress Notes (Signed)
[name] [name] Mar 22, 1941 MRN [id number]  History of Present Illness:  This is a 72 year old white male with continuous lower abdominal pain extending from the left and right lower quadrant including the suprapubic area. It is painful as well as sensitive. It is worse when he is having a bowel movement as well as  when he urinates. He has a complicated history of bilateral inguinal hernias which were repaired 50 years ago and had to be repaired again with the placement of a mesh. His bowel habits have changed to having frequent stools. He denies any rectal bleeding or blood in the urine. He has urinary frequency. He had a TURP by Dr [name] [name] in the past but was not satisfied with the results. He has urinary frequency. A CT scan of the abdomen in September of this year showed a prostate calcification, diverticuli and fatty liver. He also has gastroesophageal reflux which is currently under good control with Prilosec. There is a family history of colon cancer in his maternal grandmother. His last colonoscopy was in February 2011.   Past Medical History  Diagnosis Date  . Diabetes mellitus   . Hypertension   . Chest pain   . Flank pain   . Diabetic neuropathy   . Anxiety   . Hypercholesterolemia   . History of bronchitis   . History of chicken pox   . Heart murmur   . Kidney stones   . 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 Diverticulosis   . Osteoarthritis   . Fatty liver   . Diverticulosis    Past Surgical History  Procedure Laterality Date  . Hernia repair      X3  . Cholecystectomy  1983    reports that he has never smoked. He has never used smokeless tobacco. He reports that he does not drink alcohol or use illicit drugs. family history includes Colon cancer in his maternal grandmother; Heart disease in his father; Parkinsonism in his maternal grandfather; Stroke in his mother. There is no history of Prostate cancer. Allergies  Allergen Reactions  . Aspirin      bleeding  . Statins     Muscle pain        Review of Systems: Urinary frequency, change in bowel habits, negative for rectal bleeding  The remainder of the 10 point ROS is negative except as outlined in H&P   Physical Exam: General appearance  Well developed, in no distress. Eyes- non icteric. HEENT nontraumatic, normocephalic. Mouth no lesions, tongue papillated, no cheilosis. Neck supple without adenopathy, thyroid not enlarged, no carotid bruits, no JVD. Lungs Clear to auscultation bilaterally. Cor normal S1, normal S2, regular rhythm, no murmur,  quiet precordium. Abdomen: Soft with mild tenderness in the right lower and middle quadrant. There is also tenderness in the suprapubic area. There were post herniorrhaphy scars in the left and right inguinal area, there is no evidence of recurrent hernia. Liver edge at costal margin. Normal active bowel sounds. No CVA tenderness. Rectal: Normal rectal sphincter tone. Prominent soft very tender prostate. Stool is Hemoccult negative. Extremities no pedal edema. Skin no lesions. Neurological alert and oriented x 3. Psychological normal mood and affect.  Assessment and Plan:  Problem #26 72 year old white male with persistent lower abdominal pain which is almost constant and is associated with urination as well as with bowel movements. I suspect that he may have prostatitis, however, he may have diverticulitis as well. A CT scan of the abdomen 2  months ago did not suggest a recurrent hernia or any space occupying lesion. I will schedule him for a colonoscopy and at the same time a referral to urology. We will check his urine specimen and PSA today as well as a urine culture. We will start him on Cipro 250 mg twice a day for 14 days.   07/01/2013 [name] [name]

## 2013-07-02 LAB — URINE CULTURE
Colony Count: NO GROWTH
Organism ID, Bacteria: NO GROWTH

## 2013-07-03 ENCOUNTER — Encounter: Payer: Self-pay | Admitting: Internal Medicine

## 2013-07-08 ENCOUNTER — Other Ambulatory Visit (INDEPENDENT_AMBULATORY_CARE_PROVIDER_SITE_OTHER): Payer: Medicare Other

## 2013-07-08 DIAGNOSIS — E1149 Type 2 diabetes mellitus with other diabetic neurological complication: Secondary | ICD-10-CM

## 2013-07-08 DIAGNOSIS — E1142 Type 2 diabetes mellitus with diabetic polyneuropathy: Secondary | ICD-10-CM

## 2013-07-08 LAB — COMPREHENSIVE METABOLIC PANEL
ALT: 28 U/L (ref 0–53)
AST: 19 U/L (ref 0–37)
Albumin: 4.4 g/dL (ref 3.5–5.2)
Calcium: 10 mg/dL (ref 8.4–10.5)
Chloride: 107 mEq/L (ref 96–112)
Creatinine, Ser: 1 mg/dL (ref 0.4–1.5)
Glucose, Bld: 182 mg/dL — ABNORMAL HIGH (ref 70–99)
Potassium: 4.9 mEq/L (ref 3.5–5.1)

## 2013-07-08 LAB — LIPID PANEL
Total CHOL/HDL Ratio: 5
Triglycerides: 217 mg/dL — ABNORMAL HIGH (ref 0.0–149.0)
VLDL: 43.4 mg/dL — ABNORMAL HIGH (ref 0.0–40.0)

## 2013-07-11 ENCOUNTER — Encounter: Payer: Self-pay | Admitting: Internal Medicine

## 2013-07-11 ENCOUNTER — Ambulatory Visit (AMBULATORY_SURGERY_CENTER): Payer: Medicare Other | Admitting: Internal Medicine

## 2013-07-11 ENCOUNTER — Encounter: Payer: Self-pay | Admitting: Family Medicine

## 2013-07-11 ENCOUNTER — Ambulatory Visit (INDEPENDENT_AMBULATORY_CARE_PROVIDER_SITE_OTHER): Payer: Medicare Other | Admitting: Family Medicine

## 2013-07-11 VITALS — BP 156/80 | HR 62 | Temp 97.8°F | Ht 67.0 in | Wt 177.5 lb

## 2013-07-11 VITALS — BP 148/77 | HR 54 | Temp 96.8°F | Resp 15 | Ht 67.0 in | Wt 179.0 lb

## 2013-07-11 DIAGNOSIS — E1149 Type 2 diabetes mellitus with other diabetic neurological complication: Secondary | ICD-10-CM

## 2013-07-11 DIAGNOSIS — R1031 Right lower quadrant pain: Secondary | ICD-10-CM

## 2013-07-11 DIAGNOSIS — R079 Chest pain, unspecified: Secondary | ICD-10-CM

## 2013-07-11 DIAGNOSIS — D126 Benign neoplasm of colon, unspecified: Secondary | ICD-10-CM

## 2013-07-11 DIAGNOSIS — Z0000 Encounter for general adult medical examination without abnormal findings: Secondary | ICD-10-CM

## 2013-07-11 DIAGNOSIS — E1142 Type 2 diabetes mellitus with diabetic polyneuropathy: Secondary | ICD-10-CM

## 2013-07-11 LAB — GLUCOSE, CAPILLARY
Glucose-Capillary: 176 mg/dL — ABNORMAL HIGH (ref 70–99)
Glucose-Capillary: 135 mg/dL — ABNORMAL HIGH (ref 70–99)

## 2013-07-11 MED ORDER — SODIUM CHLORIDE 0.9 % IV SOLN: 500.0000 mL | INTRAVENOUS | Status: DC

## 2013-07-11 MED ORDER — NITROGLYCERIN 0.4 MG SL SUBL: 0.4000 mg | SUBLINGUAL_TABLET | SUBLINGUAL | Status: DC | PRN

## 2013-07-11 MED ORDER — DICLOFENAC SODIUM 75 MG PO TBEC: 75.0000 mg | DELAYED_RELEASE_TABLET | Freq: Two times a day (BID) | ORAL | Status: DC

## 2013-07-11 NOTE — Patient Instructions (Signed)
[name] [name] got you an appointment with cardiology.  Use the nitroglycerine if needed.  Limit exertion in the meantime.  If you get chest pain and the nitro doesn't help, then go to the ER or dial 911.  Take care.   Recheck sugar in about 3 months before a visit.

## 2013-07-11 NOTE — Assessment & Plan Note (Signed)
See scanned forms.  Routine anticipatory guidance given to patient.  See health maintenance. Flu 2014 Shingles deferred for now, d/w pt PNA 2013 Tetanus 2010 Colonoscopy 2014 PSA wnl Advance directive dw pt.  Daughter designated if incapacitated.   Cognitive function addressed- see scanned forms- and if abnormal then additional documentation follows.

## 2013-07-11 NOTE — Progress Notes (Signed)
Lidocaine-40mg IV prior to Propofol InductionPropofol given over incremental dosages 

## 2013-07-11 NOTE — Op Note (Signed)
[facility] [address] [place], [address]   COLONOSCOPY PROCEDURE REPORT  PATIENT: [name], [name]  MR#: [id number] BIRTHDATE: July 31, 1941 , 72  yrs. old GENDER: Male ENDOSCOPIST: Hart Carwin, MD REFERRED VH:QIONGE Para March, M.D. PROCEDURE DATE:  07/11/2013 PROCEDURE:   Colonoscopy with snare polypectomy First Screening Colonoscopy - Avg.  risk and is 50 yrs.  old or older - No.  Prior Negative Screening - Now for repeat screening. N/A  History of Adenoma - Now for follow-up colonoscopy & has been > or = to 3 yrs.  N/A  Polyps Removed Today? Yes. ASA CLASS:   Class II INDICATIONS:lower abdominal pain.  Negative CT scan of the abdomen other than diverticulosis, pancreatic calcifications and DJD of LS-spine.  Last colonoscopy February 2011.  Positive family history of colon cancer in grandparent. MEDICATIONS: MAC sedation, administered by CRNA and propofol (Diprivan) 150mg  IV  DESCRIPTION OF PROCEDURE:   After the risks benefits and alternatives of the procedure were thoroughly explained, informed consent was obtained.  A digital rectal exam revealed no abnormalities of the rectum.   The LB [id number]-[id number]  endoscope was introduced through the anus and advanced to the cecum, which was identified by both the appendix and ileocecal valve. No adverse events experienced.   The quality of the prep was Prepopik good The instrument was then slowly withdrawn as the colon was fully examined.      COLON FINDINGS: A fungating sessile polyp ranging between 5-98mm in size was found in the rectum.  A polypectomy was performed with a cold snare. there was small amount of post polypectomy bleeding. 2 endoclips were applied to the post polypectomy site The resection was complete and the polyp tissue was completely retrieved.   There was moderate diverticulosis noted throughout the entire examined colon with associated muscular hypertrophy and colonic narrowing. Retroflexed  views revealed no abnormalities. The time to cecum=4 minutes 50 seconds.  Withdrawal time=17 minutes 30 seconds.  The scope was withdrawn and the procedure completed. COMPLICATIONS: There were no complications.  ENDOSCOPIC IMPRESSION: 1.   Sessile polyp ranging between 5-26mm in size was found in the rectum; polypectomy was performed with a cold snare 2.   There was moderate diverticulosis noted throughout the entire examined colon  RECOMMENDATIONS: 1.  Await pathology results 2.  High fiber diet 3.  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

## 2013-07-11 NOTE — Progress Notes (Signed)
Called to room to assist during endoscopic procedure.  Patient ID and intended procedure confirmed with present staff. Received instructions for my participation in the procedure from the performing physician.  

## 2013-07-11 NOTE — Patient Instructions (Addendum)

## 2013-07-11 NOTE — Assessment & Plan Note (Signed)
Concern for excalating angina with exercise. rx given for NTG.  He'll have f/u with cards soon. If CP not resolved with NTG, to ER.  No exercise in meantime.

## 2013-07-11 NOTE — Assessment & Plan Note (Signed)
Chest sx take precedent now.  Will address that first and then f/u in a few months about his sugar.  He agrees. Labs d/w pt.

## 2013-07-11 NOTE — Progress Notes (Signed)
Patient did not experience any of the following events: a burn prior to discharge; a fall within the facility; wrong site/side/patient/procedure/implant event; or a hospital transfer or hospital admission upon discharge from the facility. (G8907) Patient did not have preoperative order for IV antibiotic SSI prophylaxis. (G8918)  

## 2013-07-11 NOTE — Progress Notes (Signed)
Pre-visit discussion using our clinic review tool. No additional management support is needed unless otherwise documented below in the visit note.  I have personally reviewed the Medicare Annual Wellness questionnaire and have noted 1. The patient's medical and social history 2. Their use of alcohol, tobacco or illicit drugs 3. Their current medications and supplements 4. The patient's functional ability including ADL's, fall risks, home safety risks and hearing or visual             impairment. 5. Diet and physical activities 6. Evidence for depression or mood disorders  The patients weight, height, BMI have been recorded in the chart and visual acuity is per eye clinic.  I have made referrals, counseling and provided education to the patient based review of the above and I have provided the pt with a written personalized care plan for preventive services.  See scanned forms.  Routine anticipatory guidance given to patient.  See health maintenance. Flu 2014 Shingles deferred for now, d/w pt PNA 2013 Tetanus 2010 Colonoscopy 2014 PSA wnl Advance directive dw pt.  Daughter designated if incapacitated.   Cognitive function addressed- see scanned forms- and if abnormal then additional documentation follows.   H/o likely prostatitis improved with cipro, not fully resolved, has fu with uro pending.    Burning in chest, with exertion, resolved with rest.  Progressive over the last 4-5 months.  Dec in exercise tolerance over the last few months, with onset of chest burning.  No rest sx.  Can now only walk a few minutes on treadmill before sx start. No syncope.  No known h/o CAD.  Intolerant of statin and ASA.   Noted source of stress with his wife and their relationship.   DM2.  Labs d/w pt. Not checking sugar at home.  Compliant with meds.   PMH and SH reviewed  Meds, vitals, and allergies reviewed.   ROS: See HPI.  Otherwise negative.    GEN: nad, alert and oriented HEENT: mucous  membranes moist NECK: supple w/o LA CV: rrr. PULM: ctab, no inc wob ABD: soft, +bs EXT: no edema SKIN: no acute rash  EKG w/o acute changes.

## 2013-07-12 ENCOUNTER — Telehealth: Payer: Self-pay | Admitting: *Deleted

## 2013-07-12 NOTE — Telephone Encounter (Signed)
  Follow up Call-  Call back number 07/11/2013  Post procedure Call Back phone  # [phone]  Permission to leave phone message Yes     Patient questions:  Do you have a fever, pain , or abdominal swelling? no Pain Score  0 *  Have you tolerated food without any problems? yes  Have you been able to return to your normal activities? yes  Do you have any questions about your discharge instructions: Diet   no Medications  no Follow up visit  no  Do you have questions or concerns about your Care? no  Actions: * If pain score is 4 or above: No action needed, pain <4.

## 2013-07-15 ENCOUNTER — Ambulatory Visit: Payer: Medicare Other | Admitting: Cardiovascular Disease

## 2013-07-16 ENCOUNTER — Encounter: Payer: Self-pay | Admitting: Internal Medicine

## 2013-07-19 ENCOUNTER — Ambulatory Visit (INDEPENDENT_AMBULATORY_CARE_PROVIDER_SITE_OTHER): Payer: Medicare Other | Admitting: Cardiovascular Disease

## 2013-07-19 ENCOUNTER — Encounter: Payer: Self-pay | Admitting: Cardiovascular Disease

## 2013-07-19 ENCOUNTER — Other Ambulatory Visit: Payer: Medicare Other

## 2013-07-19 VITALS — BP 158/89 | HR 69 | Ht 67.0 in | Wt 178.8 lb

## 2013-07-19 VITALS — BP 142/80 | HR 68 | Ht 67.0 in | Wt 178.8 lb

## 2013-07-19 DIAGNOSIS — R011 Cardiac murmur, unspecified: Secondary | ICD-10-CM

## 2013-07-19 DIAGNOSIS — I209 Angina pectoris, unspecified: Secondary | ICD-10-CM

## 2013-07-19 DIAGNOSIS — R079 Chest pain, unspecified: Secondary | ICD-10-CM

## 2013-07-19 MED ORDER — ASPIRIN EC 81 MG PO TBEC: 81.0000 mg | DELAYED_RELEASE_TABLET | Freq: Every day | ORAL | Status: DC

## 2013-07-19 NOTE — Patient Instructions (Signed)
Your physician has requested that you have an echocardiogram. Echocardiography is a painless test that uses sound waves to create images of your heart. It provides your doctor with information about the size and shape of your heart and how well your heart's chambers and valves are working. This procedure takes approximately one hour. There are no restrictions for this procedure.  Start Aspirin 81 mg once daily.   Your physician has requested that you have a cardiac catheterization. Cardiac catheterization is used to diagnose and/or treat various heart conditions. Doctors may recommend this procedure for a number of different reasons. The most common reason is to evaluate chest pain. Chest pain can be a symptom of coronary artery disease (CAD), and cardiac catheterization can show whether plaque is narrowing or blocking your heart's arteries. This procedure is also used to evaluate the valves, as well as measure the blood flow and oxygen levels in different parts of your heart. For further information please visit [email or web address] Please follow instruction sheet, as given.

## 2013-07-19 NOTE — Assessment & Plan Note (Signed)
The patient's current symptoms of exertional chest pain and shortness of breath or suggestive of class III angina. He has multiple risk factors for coronary artery disease. Baseline EKG does not show significant ischemic changes. I proceeded with a treadmill stress test which was highly abnormal with significant ischemic ST changes and reproducible chest discomfort. Overall, moderate risk stress test by [facility] treadmill score. Due to that, I recommend proceeding with cardiac catheterization And possible coronary intervention. Risks, benefits and alternatives were discussed with him. I asked him to start taking aspirin 81 mg once daily. He reports having subconjunctival hemorrhages in the past when he took it. I explained to him that this is not a contraindication. The patient initially agreed to have a heart catheterization. However, he left our office before we schedule the procedure. He mentioned that he wanted to discuss with his wife before he schedules any procedure.  I asked him to call us as soon as he decides.

## 2013-07-19 NOTE — Progress Notes (Signed)
Primary care physician: Dr. Para March  HPI  This is a 72 year old male who was referred for evaluation of chest pain. The patient was seen by Dr. Elease Hashimoto 2 years ago for atypical chest pain. He underwent evaluation with a treadmill nuclear stress test as well as an echocardiogram. Both of them were unremarkable. He has chronic medical conditions that include prolonged history of  type 2 diabetes, hyperlipidemia with intolerance to statins and hypertension. Over the last 2-3 months, he has been experiencing exertional substernal chest tightness and burning sensation. This usually starts with aerobic physical activities. He exercises in a regular basis but recently he stopped doing the treadmill due to this. He continues to do some weightlifting. The chest pain occasionally radiates to the left arm and resolves with rest. He denies chest discomfort at rest. Associated symptoms include shortness of breath. He denies syncope or presyncope.  Allergies  Allergen Reactions  . Aspirin     bleeding  . Statins     Muscle pain     Current Outpatient Prescriptions on File Prior to Visit  Medication Sig Dispense Refill  . acyclovir (ZOVIRAX) 400 MG tablet Take 400 mg by mouth 2 (two) times daily.      . calcium-vitamin D (OSCAL WITH D) 250-125 MG-UNIT per tablet Take 1 tablet by mouth daily.        . fenofibrate 160 MG tablet TAKE 1 TABLET BY MOUTH AT BEDTIME  90 tablet  3  . gabapentin (NEURONTIN) 600 MG tablet TAKE 1 TABLET BY MOUTH 3 TIMES A DAY  270 tablet  3  . losartan (COZAAR) 100 MG tablet TAKE 1 TABLET (100 MG TOTAL) BY MOUTH DAILY.  30 tablet  7  . metFORMIN (GLUCOPHAGE) 500 MG tablet TAKE 1 TABLET BY MOUTH 2 TIMES A DAY  60 tablet  6  . nitroGLYCERIN (NITROSTAT) 0.4 MG SL tablet Place 1 tablet (0.4 mg total) under the tongue every 5 (five) minutes as needed for chest pain. Max 3 doses in 15 minutes  25 tablet  3   No current facility-administered medications on file prior to visit.     Past  Medical History  Diagnosis Date  . Diabetes mellitus   . Hypertension   . Chest pain   . Flank pain   . Diabetic neuropathy   . Anxiety   . Hypercholesterolemia   . History of bronchitis   . History of chicken pox   . Heart murmur   . Kidney stones   . 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 Diverticulosis   . Osteoarthritis   . Fatty liver   . Diverticulosis      Past Surgical History  Procedure Laterality Date  . Hernia repair      X3  . Cholecystectomy  1983     Family History  Problem Relation Age of Onset  . Stroke Mother   . Heart disease Father     heart failure  . Parkinsonism Maternal Grandfather   . Colon cancer Maternal Grandmother   . Prostate cancer Maternal Uncle   . Stroke Paternal Grandfather      History   Social History  . Marital Status: Married    Spouse Name: N/A    Number of Children: N/A  . Years of Education: N/A   Occupational History  . Retired    Social History Main Topics  . Smoking status: Never Smoker   . Smokeless tobacco: Never Used  . Alcohol  Use: No  . Drug Use: No  . Sexual Activity: Not on file   Other Topics Concern  . 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 It is negative other than that mentioned in the history of present illness.   PHYSICAL EXAM   BP 158/89  Pulse 69  Ht 5\' 7"  (1.702 m)  Wt 178 lb 12 oz (81.08 kg)  BMI 27.99 kg/m2 Constitutional: He is oriented to person, place, and time. He appears well-developed and well-nourished. No distress.  HENT: No nasal discharge.  Head: Normocephalic and atraumatic.  Eyes: Pupils are equal and round.  No discharge. Neck: Normal range of motion. Neck supple. No JVD present. No thyromegaly present.  Cardiovascular: Normal rate, regular  rhythm, normal heart sounds. Exam reveals no gallop and no friction rub. There is a 2/6 holosystolic murmur at the apex Pulmonary/Chest: Effort normal and breath sounds normal. No stridor. No respiratory distress. He has no wheezes. He has no rales. He exhibits no tenderness.  Abdominal: Soft. Bowel sounds are normal. He exhibits no distension. There is no tenderness. There is no rebound and no guarding.  Musculoskeletal: Normal range of motion. He exhibits no edema and no tenderness.  Neurological: He is alert and oriented to person, place, and time. Coordination normal.  Skin: Skin is warm and dry. No rash noted. He is not diaphoretic. No erythema. No pallor.  Psychiatric: He has a normal mood and affect. His behavior is normal. Judgment and thought content normal.       ZOX:WRUEA  Rhythm  -Prominent R(V1) and right axis -consider right ventricular hypertrophy  -consider pulmonary disease.   ABNORMAL     ASSESSMENT AND PLAN

## 2013-07-19 NOTE — Assessment & Plan Note (Signed)
He has a cardiac murmur suggestive of mitral regurgitation. I requested an echocardiogram to be done today but again the patient did not want to wait and left our office.

## 2013-07-19 NOTE — Patient Instructions (Signed)
Your physician has requested that you have an exercise tolerance test. For further information please visit [email or web address]. Please also follow instruction sheet, as given.   

## 2013-07-19 NOTE — Procedures (Deleted)
   Treadmill Stress test  Indication: Exertional chest pain.  Baseline Data:  Resting EKG shows NSR with rate of 66 bpm, no significant ST or T wave changes. Resting blood pressure of 142/80 mm Hg Stand bruce protocal was use***d.  Exercise Data:  Patient exercised for 7 min 53 sec,  Peak heart rate of 122 bpm.  This was 83 % of the maximum predicted heart rate. The patient had substernal chest discomfort and tightness starting in stage II.  Peak Blood pressure recorded was 238/94 Maximal work level: 10.1 METs.  Heart rate at 3 minutes in recovery was 85 bpm. BP response: Hypertensive HR response: Normal  EKG with Exercise: Sinus tachycardia with 2 mm of horizontal and downsloping ST depression in the inferior leads as well as V4 to V6. These changes resolved after 5 minutes in recovery  FINAL IMPRESSION: Abnormal exercise stress test. Significant ischemic EKG changes with exercise. Good exercise tolerance with a hypertensive response. Moderate risk Duke treadmill score (-6)  Recommendation: I recommend cardiac catheterization.

## 2013-07-25 ENCOUNTER — Telehealth: Payer: Self-pay | Admitting: *Deleted

## 2013-07-25 NOTE — Procedures (Signed)
   Treadmill Stress test  Indication: Exertional chest pain.  Baseline Data:  Resting EKG shows NSR with rate of 66 bpm, no significant ST or T wave changes. Resting blood pressure of 142/80 mm Hg Stand bruce protocal was used.  Exercise Data:  Patient exercised for 7 min 53 sec,  Peak heart rate of 122 bpm.  This was 83% of the maximum predicted heart rate. The patient had substernal chest pain which limited his ability to continue exercise. This resolved in recovery.  Peak Blood pressure recorded was 238/94 Maximal work level: 10.1 METs.  Heart rate at 3 minutes in recovery was 85 bpm. BP response: Hypertensive HR response: Normal  EKG with Exercise: Sinus tachycardia with 1.5 mm of horizontal ST depression in the inferior leads as well as V4 through V6. These changes resolved after 5 minutes recovery.  FINAL IMPRESSION: Abnormal exercise stress test. Significant ischemic EKG changes with reproducible chest tightness with exercise at slightly submaximal workload. Good exercise tolerance. Duke treadmill score was -6 which is moderate distress.  Recommendation: Based on the patient's symptoms and abnormal stress test, I recommend proceeding with cardiac catheterization.

## 2013-07-25 NOTE — Telephone Encounter (Signed)
Message copied by Kendrick Fries on Thu Jul 25, 2013  8:36 AM ------      Message from: Lorine Bears A      Created: Wed Jul 24, 2013 10:06 PM       I don't see the stress test report from last week. It seems that it was deleted by mistake. I need the tracings again so I can redo the report.       For all my GXTs, do not start any report. I use my own template and not the template you use.        ------

## 2013-08-12 ENCOUNTER — Encounter: Payer: Self-pay | Admitting: *Deleted

## 2013-08-12 ENCOUNTER — Encounter: Payer: Self-pay | Admitting: Cardiovascular Disease

## 2013-08-12 ENCOUNTER — Ambulatory Visit (INDEPENDENT_AMBULATORY_CARE_PROVIDER_SITE_OTHER): Payer: Medicare Other | Admitting: Cardiovascular Disease

## 2013-08-12 VITALS — BP 132/70 | HR 66 | Wt 179.0 lb

## 2013-08-12 DIAGNOSIS — I10 Essential (primary) hypertension: Secondary | ICD-10-CM

## 2013-08-12 DIAGNOSIS — R079 Chest pain, unspecified: Secondary | ICD-10-CM

## 2013-08-12 DIAGNOSIS — R011 Cardiac murmur, unspecified: Secondary | ICD-10-CM

## 2013-08-12 NOTE — Progress Notes (Signed)
History of Present Illness: 73 yo male with history of HTN, HLD, DM who is here today for a second opinion on his recent chest pains and abnormal stress test. He was seen by Dr. Acie Fredrickson 2 years ago for chest pain and underwent evaluation with a treadmill nuclear stress test as well as an echocardiogram. Both of them were unremarkable. Over the last 3-4 months, he has been experiencing exertional substernal chest tightness and burning sensation. The pain occurs when he is on the treadmill, starting in the left chest wall and radiating to his left shoulder and arm. He was seen by Dr. Fletcher Anon in our [facility] office 07/19/13 and had an exercise treadmill stress test with ST depression and chest pain. Cardiac cath was recommended but he wanted to think about this. He denies chest discomfort at rest. He is here seeking another opinion. Also told he needed an echo for a murmur but did not arrange. No change in symptoms over last month. Still having exertional chest pain. No resting chest pain.    Primary Care Physician: [name]  Last Lipid Profile:Lipid Panel     Component Value Date/Time   CHOL 147 07/08/2013 0906   TRIG 217.0* 07/08/2013 0906   HDL 30.30* 07/08/2013 0906   CHOLHDL 5 07/08/2013 0906   VLDL 43.4* 07/08/2013 0906   LDLCALC 73 06/04/2012 0847     Past Medical History  Diagnosis Date  . Diabetes mellitus   . Hypertension   . Chest pain   . Flank pain   . Diabetic neuropathy   . Anxiety   . Hypercholesterolemia   . History of bronchitis   . History of chicken pox   . Heart murmur   . Kidney stones   . 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 Diverticulosis   . Osteoarthritis   . Fatty liver   . Diverticulosis     Past Surgical History  Procedure Laterality Date  . Hernia repair      X3  . Cholecystectomy  1983    Current Outpatient Prescriptions  Medication Sig Dispense Refill  . acyclovir (ZOVIRAX) 400 MG tablet Take 400 mg by mouth 2 (two)  times daily.      . calcium-vitamin D (OSCAL WITH D) 250-125 MG-UNIT per tablet Take 1 tablet by mouth daily.        . fenofibrate 160 MG tablet TAKE 1 TABLET BY MOUTH AT BEDTIME  90 tablet  3  . gabapentin (NEURONTIN) 600 MG tablet TAKE 1 TABLET BY MOUTH 3 TIMES A DAY  270 tablet  3  . losartan (COZAAR) 100 MG tablet TAKE 1 TABLET (100 MG TOTAL) BY MOUTH DAILY.  30 tablet  7  . metFORMIN (GLUCOPHAGE) 500 MG tablet TAKE 1 TABLET BY MOUTH 2 TIMES A DAY  60 tablet  6  . nitroGLYCERIN (NITROSTAT) 0.4 MG SL tablet Place 1 tablet (0.4 mg total) under the tongue every 5 (five) minutes as needed for chest pain. Max 3 doses in 15 minutes  25 tablet  3   No current facility-administered medications for this visit.    Allergies  Allergen Reactions  . Aspirin     bleeding  . Statins     Muscle pain    History   Social History  . Marital Status: Married    Spouse Name: N/A    Number of Children: N/A  . Years of Education: N/A   Occupational History  . Retired  Social History Main Topics  . Smoking status: Never Smoker   . Smokeless tobacco: Never Used  . Alcohol Use: No  . Drug Use: No  . Sexual Activity: Not on file   Other Topics Concern  . 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 Stroke Mother   . Heart disease Father     heart failure  . Parkinsonism Maternal Grandfather   . Colon cancer Maternal Grandmother   . Prostate cancer Maternal Uncle   . Stroke Paternal Grandfather   . CAD Brother     CABG    Review of Systems:  As stated in the HPI and otherwise negative.   BP 132/70  Pulse 66  Wt 179 lb (81.194 kg)  SpO2 95%  Physical Examination: General: Well developed, well nourished, NAD HEENT: OP clear, mucus membranes moist SKIN: warm, dry. No  rashes. Neuro: No focal deficits Musculoskeletal: Muscle strength 5/5 all ext Psychiatric: Mood and affect normal Neck: No JVD, no carotid bruits, no thyromegaly, no lymphadenopathy. Lungs:Clear bilaterally, no wheezes, rhonci, crackles Cardiovascular: Regular rate and rhythm. Systolic murmur. No gallops or rubs. Abdomen:Soft. Bowel sounds present. Non-tender.  Extremities: No lower extremity edema. Pulses are 2 + in the bilateral DP/PT.  Assessment and Plan:   1. Chest pain: He has risk factors for CAD including DM, HTN and HLD. Stress test is c/w ischemia. He is now agreeable to cardiac cath which is indicated. 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 Will arrange cardiac cath with possible PCI next week. Pre-cath labs today. Risks and benefits reviewed with pt.   2. HTN: BP controlled.   3. Cardiac murmur: Will arrange echo to assess.  

## 2013-08-12 NOTE — Patient Instructions (Signed)
Your physician recommends that you schedule a follow-up appointment in:  About 6 weeks.   Your physician has requested that you have an echocardiogram. Echocardiography is a painless test that uses sound waves to create images of your heart. It provides your doctor with information about the size and shape of your heart and how well your heart's chambers and valves are working. This procedure takes approximately one hour. There are no restrictions for this procedure.   Your physician has requested that you have a cardiac catheterization. Cardiac catheterization is used to diagnose and/or treat various heart conditions. Doctors may recommend this procedure for a number of different reasons. The most common reason is to evaluate chest pain. Chest pain can be a symptom of coronary artery disease (CAD), and cardiac catheterization can show whether plaque is narrowing or blocking your heart's arteries. This procedure is also used to evaluate the valves, as well as measure the blood flow and oxygen levels in different parts of your heart. For further information please visit [email or web address]. Please follow instruction sheet, as given. Scheduled for August 21, 2013  Coronary Angiography Coronary angiography is an X-ray procedure used to look at the arteries in the heart. In this procedure, a dye (contrast dye) is injected through a long, hollow tube (catheter). The catheter is about the size of a piece of cooked spaghetti and is inserted through your groin, wrist, or arm. The dye is injected into each artery, and X-rays are then taken to show if there is a blockage in the arteries of your heart. LET [facility] CARE PROVIDER KNOW ABOUT:  Any allergies you have, including allergies to shellfish or contrast dye.   All medicines you are taking, including vitamins, herbs, eye drops, creams, and over-the-counter medicines.   Previous problems you or members of your family have had with the use of  anesthetics.   Any blood disorders you have.   Previous surgeries you have had.  History of kidney problems or failure.   Other medical conditions you have. RISKS AND COMPLICATIONS  Generally, coronary angiography is a safe procedure. However, as with any procedure, complications can occur. Possible complications include:  Allergic reaction to the dye.  Bleeding from the access site or other locations.  Kidney injury, especially in people with impaired kidney function.  Stroke (rare).  Heart attack (rare). BEFORE THE PROCEDURE   Do not eat or drink anything after midnight the night before the procedure, or as directed by your health care provider.   Ask your health care provider if it is okay to take any needed medicines with a sip of water.  PROCEDURE  You may be given a medicine to help you relax (sedative) before the procedure. This medicine is given through an intravenous (IV) access tube that is inserted into one of your veins.   The area where the catheter will be inserted is washed and shaved. This is usually done in the groin but may be done in the fold of your arm (near your elbow) or in the wrist.   A medicine will be given to numb the area where the catheter will be inserted (local anesthetic).   The health care provider will insert the catheter into an artery. The catheter is guided by using a special type of X-ray (fluoroscopy) of the blood vessel being examined.   A special dye is then injected into the catheter, and X-rays are taken. The dye helps to show where any narrowing or blockages are located in the  heart arteries.  AFTER THE PROCEDURE   If the procedure is done through the leg, you will be kept in bed lying flat for several hours. You will be instructed to not bend or cross your legs.  The insertion site will be checked frequently.   The pulse in your feet or wrist will be checked frequently.   Additional blood tests, X-rays, and an  electrocardiogram may be done.   You may need to stay in the hospital overnight for observation.  Document Released: 01/29/2003 Document Revised: 03/27/2013 Document Reviewed: 12/17/2012 [facility] Patient Information 2014 [employer].

## 2013-08-13 LAB — CBC WITH DIFFERENTIAL/PLATELET
Basophils Absolute: 0 10*3/uL (ref 0.0–0.1)
Basophils Relative: 0.4 % (ref 0.0–3.0)
EOS PCT: 1.9 % (ref 0.0–5.0)
Eosinophils Absolute: 0.1 10*3/uL (ref 0.0–0.7)
HCT: 41.9 % (ref 39.0–52.0)
Hemoglobin: 14.4 g/dL (ref 13.0–17.0)
Lymphocytes Relative: 41.2 % (ref 12.0–46.0)
Lymphs Abs: 2.9 10*3/uL (ref 0.7–4.0)
MCHC: 34.3 g/dL (ref 30.0–36.0)
MCV: 93 fl (ref 78.0–100.0)
MONOS PCT: 5.5 % (ref 3.0–12.0)
Monocytes Absolute: 0.4 10*3/uL (ref 0.1–1.0)
NEUTROS PCT: 51 % (ref 43.0–77.0)
Neutro Abs: 3.6 10*3/uL (ref 1.4–7.7)
Platelets: 151 10*3/uL (ref 150.0–400.0)
RBC: 4.5 Mil/uL (ref 4.22–5.81)
RDW: 13.1 % (ref 11.5–14.6)
WBC: 7.1 10*3/uL (ref 4.5–10.5)

## 2013-08-13 LAB — BASIC METABOLIC PANEL
BUN: 16 mg/dL (ref 6–23)
CO2: 30 mEq/L (ref 19–32)
CREATININE: 1 mg/dL (ref 0.4–1.5)
Calcium: 9.4 mg/dL (ref 8.4–10.5)
Chloride: 105 mEq/L (ref 96–112)
GFR: 76.93 mL/min (ref >60.00)
Glucose, Bld: 123 mg/dL — ABNORMAL HIGH (ref 70–99)
POTASSIUM: 4.5 meq/L (ref 3.5–5.1)
Sodium: 140 mEq/L (ref 135–145)

## 2013-08-13 LAB — PROTIME-INR
INR: 1.1 ratio — AB (ref 0.8–1.0)
Prothrombin Time: 12 s (ref 10.2–12.4)

## 2013-08-14 ENCOUNTER — Ambulatory Visit (HOSPITAL_COMMUNITY): Payer: Medicare Other | Attending: Cardiovascular Disease | Admitting: Radiology

## 2013-08-14 ENCOUNTER — Encounter: Payer: Self-pay | Admitting: Cardiovascular Disease

## 2013-08-14 DIAGNOSIS — I379 Nonrheumatic pulmonary valve disorder, unspecified: Secondary | ICD-10-CM | POA: Insufficient documentation

## 2013-08-14 DIAGNOSIS — E119 Type 2 diabetes mellitus without complications: Secondary | ICD-10-CM | POA: Insufficient documentation

## 2013-08-14 DIAGNOSIS — E7800 Pure hypercholesterolemia, unspecified: Secondary | ICD-10-CM | POA: Insufficient documentation

## 2013-08-14 DIAGNOSIS — R072 Precordial pain: Secondary | ICD-10-CM

## 2013-08-14 DIAGNOSIS — R011 Cardiac murmur, unspecified: Secondary | ICD-10-CM | POA: Insufficient documentation

## 2013-08-14 DIAGNOSIS — R079 Chest pain, unspecified: Secondary | ICD-10-CM | POA: Insufficient documentation

## 2013-08-14 NOTE — Progress Notes (Signed)
Echocardiogram performed.  

## 2013-08-16 ENCOUNTER — Encounter (HOSPITAL_COMMUNITY): Payer: Self-pay | Admitting: Respiratory Therapy

## 2013-08-16 ENCOUNTER — Other Ambulatory Visit: Payer: Self-pay | Admitting: Internal Medicine

## 2013-08-17 ENCOUNTER — Other Ambulatory Visit: Payer: Self-pay | Admitting: Family Medicine

## 2013-08-21 ENCOUNTER — Inpatient Hospital Stay (HOSPITAL_COMMUNITY)
Admission: RE | Admit: 2013-08-21 | Discharge: 2013-08-30 | DRG: 234 | Disposition: A | Discharge status: Home / Self Care | Payer: Medicare Other | Source: Ambulatory Visit | Attending: Cardiothoracic Surgery | Admitting: Cardiothoracic Surgery

## 2013-08-21 ENCOUNTER — Encounter (HOSPITAL_COMMUNITY): Payer: Self-pay | Admitting: General Practice

## 2013-08-21 ENCOUNTER — Encounter (HOSPITAL_COMMUNITY)
Admission: RE | Disposition: A | Discharge status: Home / Self Care | Payer: Self-pay | Source: Ambulatory Visit | Attending: Cardiothoracic Surgery

## 2013-08-21 ENCOUNTER — Other Ambulatory Visit: Payer: Self-pay | Admitting: *Deleted

## 2013-08-21 ENCOUNTER — Ambulatory Visit (HOSPITAL_COMMUNITY): Payer: Medicare Other

## 2013-08-21 DIAGNOSIS — I200 Unstable angina: Secondary | ICD-10-CM | POA: Diagnosis present

## 2013-08-21 DIAGNOSIS — F411 Generalized anxiety disorder: Secondary | ICD-10-CM | POA: Diagnosis present

## 2013-08-21 DIAGNOSIS — D62 Acute posthemorrhagic anemia: Secondary | ICD-10-CM | POA: Diagnosis not present

## 2013-08-21 DIAGNOSIS — Z01810 Encounter for preprocedural cardiovascular examination: Secondary | ICD-10-CM

## 2013-08-21 DIAGNOSIS — I519 Heart disease, unspecified: Secondary | ICD-10-CM | POA: Diagnosis not present

## 2013-08-21 DIAGNOSIS — K219 Gastro-esophageal reflux disease without esophagitis: Secondary | ICD-10-CM | POA: Diagnosis present

## 2013-08-21 DIAGNOSIS — I4891 Unspecified atrial fibrillation: Secondary | ICD-10-CM

## 2013-08-21 DIAGNOSIS — I10 Essential (primary) hypertension: Secondary | ICD-10-CM | POA: Diagnosis present

## 2013-08-21 DIAGNOSIS — E8779 Other fluid overload: Secondary | ICD-10-CM | POA: Diagnosis not present

## 2013-08-21 DIAGNOSIS — I2510 Atherosclerotic heart disease of native coronary artery without angina pectoris: Principal | ICD-10-CM

## 2013-08-21 DIAGNOSIS — D696 Thrombocytopenia, unspecified: Secondary | ICD-10-CM | POA: Diagnosis not present

## 2013-08-21 DIAGNOSIS — I209 Angina pectoris, unspecified: Secondary | ICD-10-CM

## 2013-08-21 DIAGNOSIS — E1142 Type 2 diabetes mellitus with diabetic polyneuropathy: Secondary | ICD-10-CM | POA: Diagnosis present

## 2013-08-21 DIAGNOSIS — M1990 Unspecified osteoarthritis, unspecified site: Secondary | ICD-10-CM | POA: Diagnosis present

## 2013-08-21 DIAGNOSIS — K7689 Other specified diseases of liver: Secondary | ICD-10-CM | POA: Diagnosis present

## 2013-08-21 DIAGNOSIS — R079 Chest pain, unspecified: Secondary | ICD-10-CM

## 2013-08-21 DIAGNOSIS — Z8249 Family history of ischemic heart disease and other diseases of the circulatory system: Secondary | ICD-10-CM

## 2013-08-21 DIAGNOSIS — Z951 Presence of aortocoronary bypass graft: Secondary | ICD-10-CM

## 2013-08-21 DIAGNOSIS — J9819 Other pulmonary collapse: Secondary | ICD-10-CM | POA: Diagnosis not present

## 2013-08-21 DIAGNOSIS — E7800 Pure hypercholesterolemia, unspecified: Secondary | ICD-10-CM | POA: Diagnosis present

## 2013-08-21 DIAGNOSIS — E785 Hyperlipidemia, unspecified: Secondary | ICD-10-CM | POA: Diagnosis present

## 2013-08-21 DIAGNOSIS — I480 Paroxysmal atrial fibrillation: Secondary | ICD-10-CM | POA: Diagnosis not present

## 2013-08-21 DIAGNOSIS — E1149 Type 2 diabetes mellitus with other diabetic neurological complication: Secondary | ICD-10-CM

## 2013-08-21 HISTORY — DX: Atherosclerotic heart disease of native coronary artery without angina pectoris: I25.10

## 2013-08-21 HISTORY — PX: LEFT HEART CATHETERIZATION WITH CORONARY ANGIOGRAM: SHX5451

## 2013-08-21 HISTORY — DX: Gastro-esophageal reflux disease without esophagitis: K21.9

## 2013-08-21 HISTORY — PX: CARDIAC CATHETERIZATION: SHX172

## 2013-08-21 HISTORY — DX: Family history of other specified conditions: Z84.89

## 2013-08-21 LAB — PULMONARY FUNCTION TEST
DL/VA % pred: 110 %
DL/VA: 4.87 ml/min/mmHg/L
DLCO cor % pred: 117 %
DLCO cor: 33.29 ml/min/mmHg
DLCO unc % pred: 117 %
DLCO unc: 33.29 ml/min/mmHg
FEF 25-75 Post: 1.37 L/sec
FEF 25-75 Pre: 1.48 L/sec
FEF2575-%Change-Post: -7 %
FEF2575-%Pred-Post: 67 %
FEF2575-%Pred-Pre: 72 %
FEV1-%Change-Post: 1 %
FEV1-%Pred-Post: 107 %
FEV1-%Pred-Pre: 105 %
FEV1-Post: 2.97 L
FEV1-Pre: 2.92 L
FEV1FVC-%Change-Post: 10 %
FEV1FVC-%Pred-Pre: 92 %
FEV6-%Change-Post: -4 %
FEV6-%Pred-Post: 108 %
FEV6-%Pred-Pre: 113 %
FEV6-Post: 3.89 L
FEV6-Pre: 4.06 L
FEV6FVC-%Change-Post: 3 %
FEV6FVC-%Pred-Post: 104 %
FEV6FVC-%Pred-Pre: 100 %
FVC-%Change-Post: -7 %
FVC-%Pred-Post: 104 %
FVC-%Pred-Pre: 113 %
FVC-Post: 3.97 L
FVC-Pre: 4.31 L
Post FEV1/FVC ratio: 75 %
Post FEV6/FVC ratio: 98 %
Pre FEV1/FVC ratio: 68 %
Pre FEV6/FVC Ratio: 94 %
RV % pred: -3 %
RV: -0.08 L
TLC % pred: 100 %
TLC: 6.47 L

## 2013-08-21 LAB — GLUCOSE, CAPILLARY
GLUCOSE-CAPILLARY: 141 mg/dL — AB (ref 70–99)
GLUCOSE-CAPILLARY: 128 mg/dL — AB (ref 70–99)
GLUCOSE-CAPILLARY: 110 mg/dL — AB (ref 70–99)
Glucose-Capillary: 183 mg/dL — ABNORMAL HIGH (ref 70–99)

## 2013-08-21 SURGERY — LEFT HEART CATHETERIZATION WITH CORONARY ANGIOGRAM
Anesthesia: LOCAL

## 2013-08-21 MED ORDER — FENTANYL CITRATE 0.05 MG/ML IJ SOLN
INTRAMUSCULAR | Status: AC
  Filled 2013-08-21: qty 2

## 2013-08-21 MED ORDER — SODIUM CHLORIDE 0.9 % IJ SOLN: 3.0000 mL | Freq: Two times a day (BID) | INTRAMUSCULAR | Status: DC

## 2013-08-21 MED ORDER — SODIUM CHLORIDE 0.9 % IJ SOLN: 3.0000 mL | INTRAMUSCULAR | Status: DC | PRN

## 2013-08-21 MED ORDER — LOSARTAN POTASSIUM 50 MG PO TABS: 100.0000 mg | ORAL_TABLET | Freq: Every day | ORAL | Status: DC

## 2013-08-21 MED ORDER — MIDAZOLAM HCL 2 MG/2ML IJ SOLN
INTRAMUSCULAR | Status: AC
  Filled 2013-08-21: qty 2

## 2013-08-21 MED ORDER — ACYCLOVIR 400 MG PO TABS
400.0000 mg | ORAL_TABLET | Freq: Two times a day (BID) | ORAL | Status: DC
  Administered 2013-08-21 – 2013-08-23 (×5): 400 mg via ORAL
  Filled 2013-08-21 (×10): qty 1

## 2013-08-21 MED ORDER — NITROGLYCERIN 0.2 MG/ML ON CALL CATH LAB
INTRAVENOUS | Status: AC
  Filled 2013-08-21: qty 1

## 2013-08-21 MED ORDER — ACETAMINOPHEN 325 MG PO TABS: 650.0000 mg | ORAL_TABLET | ORAL | Status: DC | PRN

## 2013-08-21 MED ORDER — LOSARTAN POTASSIUM 50 MG PO TABS
100.0000 mg | ORAL_TABLET | Freq: Every day | ORAL | Status: DC
  Administered 2013-08-21: 100 mg via ORAL
  Filled 2013-08-21 (×2): qty 2

## 2013-08-21 MED ORDER — ALBUTEROL SULFATE (2.5 MG/3ML) 0.083% IN NEBU
2.5000 mg | INHALATION_SOLUTION | Freq: Once | RESPIRATORY_TRACT | Status: AC
  Administered 2013-08-21: 2.5 mg via RESPIRATORY_TRACT

## 2013-08-21 MED ORDER — LIDOCAINE HCL (PF) 1 % IJ SOLN
INTRAMUSCULAR | Status: AC
  Filled 2013-08-21: qty 30

## 2013-08-21 MED ORDER — MORPHINE SULFATE 2 MG/ML IJ SOLN: 2.0000 mg | INTRAMUSCULAR | Status: DC | PRN

## 2013-08-21 MED ORDER — PSYLLIUM 95 % PO PACK
1.0000 | PACK | Freq: Every day | ORAL | Status: DC
Start: 2013-08-21 — End: 2013-08-23
  Administered 2013-08-21: 1 via ORAL
  Administered 2013-08-22: 10:00:00 via ORAL
  Filled 2013-08-21 (×3): qty 1

## 2013-08-21 MED ORDER — GABAPENTIN 600 MG PO TABS
600.0000 mg | ORAL_TABLET | Freq: Three times a day (TID) | ORAL | Status: DC
  Administered 2013-08-21: 600 mg via ORAL
  Filled 2013-08-21 (×5): qty 1

## 2013-08-21 MED ORDER — VERAPAMIL HCL 2.5 MG/ML IV SOLN
INTRAVENOUS | Status: AC
  Filled 2013-08-21: qty 2

## 2013-08-21 MED ORDER — SODIUM CHLORIDE 0.9 % IV SOLN: INTRAVENOUS | Status: AC

## 2013-08-21 MED ORDER — HEPARIN (PORCINE) IN NACL 2-0.9 UNIT/ML-% IJ SOLN
INTRAMUSCULAR | Status: AC
  Filled 2013-08-21: qty 1500

## 2013-08-21 MED ORDER — SODIUM CHLORIDE 0.9 % IV SOLN: 250.0000 mL | INTRAVENOUS | Status: DC | PRN

## 2013-08-21 MED ORDER — OXYCODONE-ACETAMINOPHEN 5-325 MG PO TABS: 1.0000 | ORAL_TABLET | ORAL | Status: DC | PRN

## 2013-08-21 MED ORDER — SODIUM CHLORIDE 0.9 % IV SOLN
INTRAVENOUS | Status: DC
  Administered 2013-08-21: 07:00:00 via INTRAVENOUS

## 2013-08-21 MED ORDER — NITROGLYCERIN 0.4 MG SL SUBL: 0.4000 mg | SUBLINGUAL_TABLET | SUBLINGUAL | Status: DC | PRN

## 2013-08-21 MED ORDER — HEPARIN SODIUM (PORCINE) 1000 UNIT/ML IJ SOLN
INTRAMUSCULAR | Status: AC
  Filled 2013-08-21: qty 1

## 2013-08-21 MED ORDER — DIAZEPAM 5 MG PO TABS
5.0000 mg | ORAL_TABLET | ORAL | Status: AC
  Administered 2013-08-21: 5 mg via ORAL

## 2013-08-21 MED ORDER — CIPROFLOXACIN HCL 250 MG PO TABS
250.0000 mg | ORAL_TABLET | Freq: Two times a day (BID) | ORAL | Status: DC
  Administered 2013-08-21 – 2013-08-30 (×17): 250 mg via ORAL
  Filled 2013-08-21 (×22): qty 1

## 2013-08-21 MED ORDER — DIAZEPAM 5 MG PO TABS
ORAL_TABLET | ORAL | Status: AC
  Filled 2013-08-21: qty 1

## 2013-08-21 MED ORDER — ONDANSETRON HCL 4 MG/2ML IJ SOLN: 4.0000 mg | Freq: Four times a day (QID) | INTRAMUSCULAR | Status: DC | PRN

## 2013-08-21 NOTE — Progress Notes (Signed)
VASCULAR LAB PRELIMINARY  PRELIMINARY  PRELIMINARY  PRELIMINARY  Pre-op Cardiac Surgery  Carotid Findings:  Bilateral:  1-39% ICA stenosis.  Vertebral artery flow is antegrade.      Upper Extremity Right Left  Brachial Pressures Cath via right radial today      165   Triphasic   Radial Waveforms  Triphasic   Ulnar Waveforms  Triphasic   Palmar Arch (Allen's Test)  Within normal limits      Lower  Extremity Right Left  Dorsalis Pedis    207   Biphasic    150   Triphasic   Anterior Tibial    Posterior Tibial     209  Triphasic    210   Triphasic   Ankle/Brachial Indices 1.3   1.3     [name] [name], RVT 08/21/2013, 2:05 PM

## 2013-08-21 NOTE — Interval H&P Note (Signed)
History and Physical Interval Note:  08/21/2013 8:29 AM  [name] [name]  has presented today for cardiac cath with the diagnosis of class III, unstable angina. The various methods of treatment have been discussed with the patient and family. After consideration of risks, benefits and other options for treatment, the patient has consented to  Procedure(s): LEFT HEART CATHETERIZATION WITH CORONARY ANGIOGRAM (N/A) as a surgical intervention .  The patient's history has been reviewed, patient examined, no change in status, stable for surgery.  I have reviewed the patient's chart and labs.  Questions were answered to the patient's satisfaction.    Cath Lab Visit (complete for each Cath Lab visit)  Clinical Evaluation Leading to the Procedure:   ACS: no  Non-ACS:    Anginal Classification: CCS III  Anti-ischemic medical therapy: No Therapy  Non-Invasive Test Results: Intermediate-risk stress test findings: cardiac mortality 1-3%/year  Prior CABG: No previous CABG         Anel Purohit

## 2013-08-21 NOTE — H&P (View-Only) (Signed)
History of Present Illness: 73 yo male with history of HTN, HLD, DM who is here today for a second opinion on his recent chest pains and abnormal stress test. He was seen by Dr. Acie Fredrickson 2 years ago for chest pain and underwent evaluation with a treadmill nuclear stress test as well as an echocardiogram. Both of them were unremarkable. Over the last 3-4 months, he has been experiencing exertional substernal chest tightness and burning sensation. The pain occurs when he is on the treadmill, starting in the left chest wall and radiating to his left shoulder and arm. He was seen by Dr. Fletcher Anon in our [facility] office 07/19/13 and had an exercise treadmill stress test with ST depression and chest pain. Cardiac cath was recommended but he wanted to think about this. He denies chest discomfort at rest. He is here seeking another opinion. Also told he needed an echo for a murmur but did not arrange. No change in symptoms over last month. Still having exertional chest pain. No resting chest pain.    Primary Care Physician: [name]  Last Lipid Profile:Lipid Panel     Component Value Date/Time   CHOL 147 07/08/2013 0906   TRIG 217.0* 07/08/2013 0906   HDL 30.30* 07/08/2013 0906   CHOLHDL 5 07/08/2013 0906   VLDL 43.4* 07/08/2013 0906   LDLCALC 73 06/04/2012 0847     Past Medical History  Diagnosis Date  . Diabetes mellitus   . Hypertension   . Chest pain   . Flank pain   . Diabetic neuropathy   . Anxiety   . Hypercholesterolemia   . History of bronchitis   . History of chicken pox   . Heart murmur   . Kidney stones   . 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 Diverticulosis   . Osteoarthritis   . Fatty liver   . Diverticulosis     Past Surgical History  Procedure Laterality Date  . Hernia repair      X3  . Cholecystectomy  1983    Current Outpatient Prescriptions  Medication Sig Dispense Refill  . acyclovir (ZOVIRAX) 400 MG tablet Take 400 mg by mouth 2 (two)  times daily.      . calcium-vitamin D (OSCAL WITH D) 250-125 MG-UNIT per tablet Take 1 tablet by mouth daily.        . fenofibrate 160 MG tablet TAKE 1 TABLET BY MOUTH AT BEDTIME  90 tablet  3  . gabapentin (NEURONTIN) 600 MG tablet TAKE 1 TABLET BY MOUTH 3 TIMES A DAY  270 tablet  3  . losartan (COZAAR) 100 MG tablet TAKE 1 TABLET (100 MG TOTAL) BY MOUTH DAILY.  30 tablet  7  . metFORMIN (GLUCOPHAGE) 500 MG tablet TAKE 1 TABLET BY MOUTH 2 TIMES A DAY  60 tablet  6  . nitroGLYCERIN (NITROSTAT) 0.4 MG SL tablet Place 1 tablet (0.4 mg total) under the tongue every 5 (five) minutes as needed for chest pain. Max 3 doses in 15 minutes  25 tablet  3   No current facility-administered medications for this visit.    Allergies  Allergen Reactions  . Aspirin     bleeding  . Statins     Muscle pain    History   Social History  . Marital Status: Married    Spouse Name: N/A    Number of Children: N/A  . Years of Education: N/A   Occupational History  . Retired  Social History Main Topics  . Smoking status: Never Smoker   . Smokeless tobacco: Never Used  . Alcohol Use: No  . Drug Use: No  . Sexual Activity: Not on file   Other Topics Concern  . 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 Stroke Mother   . Heart disease Father     heart failure  . Parkinsonism Maternal Grandfather   . Colon cancer Maternal Grandmother   . Prostate cancer Maternal Uncle   . Stroke Paternal Grandfather   . CAD Brother     CABG    Review of Systems:  As stated in the HPI and otherwise negative.   BP 132/70  Pulse 66  Wt 179 lb (81.194 kg)  SpO2 95%  Physical Examination: General: Well developed, well nourished, NAD HEENT: OP clear, mucus membranes moist SKIN: warm, dry. No  rashes. Neuro: No focal deficits Musculoskeletal: Muscle strength 5/5 all ext Psychiatric: Mood and affect normal Neck: No JVD, no carotid bruits, no thyromegaly, no lymphadenopathy. Lungs:Clear bilaterally, no wheezes, rhonci, crackles Cardiovascular: Regular rate and rhythm. Systolic murmur. No gallops or rubs. Abdomen:Soft. Bowel sounds present. Non-tender.  Extremities: No lower extremity edema. Pulses are 2 + in the bilateral DP/PT.  Assessment and Plan:   1. Chest pain: He has risk factors for CAD including DM, HTN and HLD. Stress test is c/w ischemia. He is now agreeable to cardiac cath which is indicated. 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 Will arrange cardiac cath with possible PCI next week. Pre-cath labs today. Risks and benefits reviewed with pt.   2. HTN: BP controlled.   3. Cardiac murmur: Will arrange echo to assess.

## 2013-08-21 NOTE — Consult Note (Signed)
[address]       [place] [address]             [phone]        [name] [name] [facility] [id number] Date of Birth: 1941/05/11  Referring: Dr. Angelena Form Primary Care: [name], MD  Chief Complaint:  Chest pain/tightness with exertion   History of Present Illness:     Patient is  73 yo male with history of hypertension, hyperlipidemia, and diabetes mellitus who presents to [facility] today for a second opinion on his recent chest pains and abnormal stress test. He was seen by Dr. Acie Fredrickson 2 years ago for chest pain and underwent evaluation with a treadmill nuclear stress test as well as an echocardiogram. Both of them were unremarkable. Over the last 3-4 months, he has been experiencing exertional substernal chest tightness and burning sensation. The pain occurs when he is on the treadmill, starting in the left chest wall and radiating to his left shoulder and arm. He denies shortness of breath, diaphoresis, nausea, emesis, or syncope.He was seen by Dr. Fletcher Anon in our [place] office 07/19/13 and had an exercise treadmill stress test. Results showed he had chest pain and ST depression . Cardiac cath was recommended, but he wanted to think about this. He denies chest discomfort at rest. Also, he was told he needed an echo for a murmur, but he did not arrange for this. He has had no change in symptoms over last month. Cardiac catheterization was done today by Dr. Angelena Form. Patient was found to have a preserved LVEF at 60% and multivessel coronary artery disease. A cardiothoracic consultation was  Requested. Dr.  Currently, he is chest pain free and hemodynamically stable.  Patient reports he has had lower abdominal pain. He saw both his medical doctor and a urologist. CT of the abdomen and pelvis showed multiple colonic diverticula, no diverticulitis, renovascular calcification on the left, and fatty infiltration of the liver. It was felt he had prostatitis  and he is on Cipro for this. He states this lower abdominal pain is much improved.  Current Activity/ Functional Status: Mobility/Ambulation: Independent with mobility prior to admission in the home on all surfaces with no assistive devices for unlimited distances.    Zubrod Score: At the time of surgery this patient's most appropriate activity status/level should be described as: []  Normal activity, no symptoms [x]  Symptoms, fully ambulatory []  Symptoms, in bed less than or equal to 50% of the time []  Symptoms, in bed greater than 50% of the time but less than 100% []  Bedridden []  Moribund  Past Medical History  Diagnosis Date  . Diabetes mellitus   . Hypertension   . Chest pain   . Flank pain   . Diabetic neuropathy   . Anxiety   . Hypercholesterolemia   . History of bronchitis   . History of chicken pox   . Heart murmur   . Kidney stones   . 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 Diverticulosis   . Osteoarthritis   . Fatty liver   . Diverticulosis   . Family history of anesthesia complication     ' they cant wake my brother very easy"  . Coronary artery disease 08/21/2013    severe triple vessel  . Anginal pain   . GERD (gastroesophageal reflux disease)     Past Surgical History  Procedure Laterality Date  . Hernia repair  X3  . Cholecystectomy  1983  . Cardiac catheterization  08/21/2013    DR Angelena Form    Family History: Mother had a stroke and was deceased in her 69's. Father had heart failure and died at age 19.  History   Social History  . Marital Status: Married    Spouse Name: N/A    Number of Children: N/A  . Years of Education: N/A   Occupational History  . Retired    Social History Main Topics  . Smoking status: Never Smoker   . Smokeless tobacco: Never Used  . Alcohol Use: No  . Drug Use: No  . 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 Aspirin     bleeding  . Statins     Muscle pain    Current Facility-Administered Medications  Medication Dose Route Frequency Provider Last Rate Last Dose  . 0.9 %  sodium chloride infusion   Intravenous Continuous Burnell Blanks, MD 75 mL/hr at 08/21/13 [phone]    . acetaminophen (TYLENOL) tablet 650 mg  650 mg Oral Q4H PRN Burnell Blanks, MD      . acyclovir (ZOVIRAX) tablet 400 mg  400 mg Oral BID Burnell Blanks, MD      . ciprofloxacin (CIPRO) tablet 250 mg  250 mg Oral BID Burnell Blanks, MD   250 mg at 08/21/13 1125  . gabapentin (NEURONTIN) tablet 600 mg  600 mg Oral TID Burnell Blanks, MD      . losartan (COZAAR) tablet 100 mg  100 mg Oral Daily Burnell Blanks, MD   100 mg at 08/21/13 1125  . morphine 2 MG/ML injection 2 mg  2 mg Intravenous Q1H PRN Burnell Blanks, MD      . nitroGLYCERIN (NITROSTAT) SL tablet 0.4 mg  0.4 mg Sublingual Q5 min PRN Burnell Blanks, MD      . ondansetron [facility]) injection 4 mg  4 mg Intravenous Q6H PRN Burnell Blanks, MD      . oxyCODONE-acetaminophen (PERCOCET/ROXICET) 5-325 MG per tablet 1-2 tablet  1-2 tablet Oral Q4H PRN Burnell Blanks, MD        Prescriptions prior to admission  Medication Sig Dispense Refill  . acyclovir (ZOVIRAX) 400 MG tablet Take 400 mg by mouth 2 (two) times daily.      . calcium-vitamin D (OSCAL WITH D) 250-125 MG-UNIT per tablet Take 1 tablet by mouth daily.        . ciprofloxacin (CIPRO) 250 MG tablet Take 250 mg by mouth 2 (two) times daily.      . diclofenac (VOLTAREN) 75 MG EC tablet Take 75 mg by mouth 2 (two) times daily.      . fenofibrate 160 MG tablet Take 160 mg by mouth at bedtime.      . gabapentin (NEURONTIN) 600 MG tablet Take 600 mg by mouth 3 (three) times daily.      Marland Kitchen losartan (COZAAR)  100 MG tablet Take 100 mg by mouth daily.      . metFORMIN (GLUCOPHAGE) 500 MG tablet TAKE 1 TABLET BY MOUTH 2 TIMES A DAY  60 tablet  5  . nitroGLYCERIN (NITROSTAT) 0.4 MG SL tablet Place  1 tablet (0.4 mg total) under the tongue every 5 (five) minutes as needed for chest pain. Max 3 doses in 15 minutes  25 tablet  3    Family History  Problem Relation Age of Onset  . Stroke Mother   . Heart disease Father     heart failure  . Parkinsonism Maternal Grandfather   . Colon cancer Maternal Grandmother   . Prostate cancer Maternal Uncle   . Stroke Paternal Grandfather   . CAD Brother     CABG     Review of Systems:     Cardiac Review of Systems: Y or N  Chest Pain [  y  ]  Resting SOB [ n  ] Exertional SOB  [  n]  Orthopnea [ n ]   Pedal Edema [ n  ]    Palpitations [ n ] Syncope  [ n ]   Presyncope [ n  ]  General Review of Systems: [Y] = yes [  ]=no Constitional: recent weight change [ n ]; anorexia [ n ]; fatigue [  n]; nausea [  n]; night sweats [n  ]; fever [ n ]; or chills [ n ]                                                  Eye : blurred vision [ n ]; diplopia [ n  ]; vision changes [  n];  Amaurosis fugax[ n ]; Resp: cough [ n ];  wheezing[n  ];  hemoptysis[ n ]; shortness of breath[ n ]; paroxysmal nocturnal dyspnea[ n ]; dyspnea on exertion[ n ]; or orthopnea[n  ];  GI:  vomiting[n  ];  dysphagia[n  ]; melena[ n ];  hematochezia [ n ]; heartburn[n  ]  GU: kidney stones [n  ]; hematuria[ n ];   dysuria [ n n];  nocturia[ n ];  history of obstruction [n  ]; urinary frequency [ n ]; [y] prostatitis             Skin: rash, swelling[n  ];, hair loss[n  ];  peripheral             edema[ n ];  or itching[ n ]; Musculosketetal: myalgias[  ];  joint swelling[  ];  joint erythema[  ];  joint pain[  ];  back pain[  ];  Heme/Lymph: bruising[  n];  bleeding[n  ];  anemia[              n ]; Neuro: TIA[  ];  headaches[n  ];  stroke[ n ];                     vertigo[ n ];  seizures[ n ];   neruopathy [y];                              difficulty walking[ n ];  Psych:depression[n  ]; anxiety[ n ];  Endocrine: diabetes[y  ];  thyroid dysfunction[n  ];      Physical Exam: BP 138/58  Pulse 55  Temp(Src) 97.7 F (36.5 C) (Oral)  Resp 18  Ht 5\' 7"  (1.702 m)  Wt 81.194 kg (179 lb)  BMI 28.03 kg/m2  SpO2 96%  General appearance: alert, cooperative and no distress HEENT: Head-atraumatic,  normocephalic Eyes-EOMI, PERRLA, sclera non icteric Neck-supple, no JVD, no carotid bruits Heart: regular rate and rhythm and systolic murmur: systolic [id number] Lungs: clear to auscultation bilaterally Abdomen: soft, non-tender; bowel sounds normal; no masses,  no organomegaly Extremities: No cyanosis, clubbing, or edema Neurologic: intact  Diagnostic Studies & Laboratory data:     Recent Radiology Findings:  None   Recent Lab Findings: Lab Results  Component Value Date   WBC 7.1 08/12/2013   HGB 14.4 08/12/2013   HCT 41.9 08/12/2013   PLT 151.0 08/12/2013   GLUCOSE 123* 08/12/2013   CHOL 147 07/08/2013   TRIG 217.0* 07/08/2013   HDL 30.30* 07/08/2013   LDLDIRECT 79.8 07/08/2013   LDLCALC 73 06/04/2012   ALT 28 07/08/2013   AST 19 07/08/2013   NA 140 08/12/2013   K 4.5 08/12/2013   CL 105 08/12/2013   CREATININE 1.0 08/12/2013   BUN 16 08/12/2013   CO2 30 08/12/2013   TSH 1.40 12/15/2011   INR 1.1* 08/12/2013   HGBA1C 7.6* 07/08/2013   CATH: 1/14/20159:02 AM  [name], MD  Procedure Performed:  1. Left Heart Catheterization 2. Selective Coronary Angiography 3. Left ventricular angiogram Operator: Lauree Chandler, MD  Arterial access site: Right radial artery.  Indication: 73 yo male with history of HTN, HLD, DM who is here today for cardiac cath with recent chest pain concerning for unstable angina. Over the last 3-4 months, he has been experiencing exertional substernal chest tightness and burning sensation. The pain occurs when he is on the treadmill, starting in the left chest wall and  radiating to his left shoulder and arm. He was seen by Dr. Fletcher Anon in our [facility] office 07/19/13 and had an exercise treadmill stress test with ST depression and chest pain. Cardiac cath was recommended but he wanted to think about this. I saw him in the office last week and he agreed to cath.  Procedure Details:  The risks, benefits, complications, treatment options, and expected outcomes were discussed with the patient. The patient and/or family concurred with the proposed plan, giving informed consent. The patient was brought to the cath lab after IV hydration was begun and oral premedication was given. The patient was further sedated with Versed and Fentanyl. The right wrist was assessed with an Allens test which was positive. The right wrist was prepped and draped in a sterile fashion. 1% lidocaine was used for local anesthesia. Using the modified Seldinger access technique, a 5 French sheath was placed in the right radial artery. 3 mg Verapamil was given through the sheath. 4000 units IV heparin was given. Standard diagnostic catheters were used to perform selective coronary angiography. A pigtail catheter was used to perform a left ventricular angiogram. The sheath was removed from the right radial artery and a Terumo hemostasis band was applied at the arteriotomy site on the right wrist.  There were no immediate complications. The patient was taken to the recovery area in stable condition.  Hemodynamic Findings:  Central aortic pressure: 159/79  Left ventricular pressure: 157/10/18  Angiographic Findings:  Left main: No obstructive disease.  Left Anterior Descending Artery: Large caliber vessel that courses to the apex and gives off a moderate caliber diagonal branch. The mid LAD has 50% stenosis followed by a focal 70-80% stenosis. The distal vessel has diffuse disease with serial 90% stenoses just before the vessel wraps around the apex. The diagonal branch is moderate in caliber with 99%  stenosis in the mid vessel just before a bifurcation.  Circumflex Artery: Large caliber vessel with 99% proximal stenosis. The mid vessel has diffuse 40% stenosis. There is a moderate to large caliber obtuse marginal branch that arises distally with 80% proximal stenosis. The AV groove Circumflex beyond the takeoff of the OM branch has 99% stenosis.  Right Coronary Artery: Large caliber, dominant vessel with 100% proximal occlusion after a large conus branch that supplies a portion of the RV wall. The proximal, mid and distal vessel fills from left to right collaterals.  Left Ventricular Angiogram: LVEF=60%.  Impression:  1. Severe triple vessel CAD  2. Unstable angina  3. Preserved LV systolic function  Recommendations: Multivessel CAD in a diabetic patient presenting with unstable angina. Will consult CT surgery for CABG. Will admit to telemetry unit today. He does not tolerate ASA secondary to "bleeding in eyes" in past. Both patient and wife note that he has had vessel rupture in eyes with even one day of ASA 81 mg. Will hold off on ASA for now. He is intolerant of statins. No beta blocker with bradycardia. Of note, he has been on Cipro for abdominal pain and presumed prostatitis.  Complications: None. The patient tolerated the procedure well.       ECHO: Patient: [name] [name], [name] [name] MR #: [id number] Study Date: 08/14/2013 Gender: M Age: 36 Height: 170.2cm Weight: 81.2kg BSA: 1.43m^2 Pt. Status: Room:  SONOGRAPHER Cindy Hazy, RDCS ATTENDING Sonny Dandy, Christopher REFERRING McAlhany, Christopher PERFORMING [facility], Outpatient cc:  ------------------------------------------------------------ LV EF: 55% - 60%  ------------------------------------------------------------ Indications: Chest pain 786.51. Murmur 785.2.  ------------------------------------------------------------ History: PMH: Acquired from the patient and from the patient's chart. Risk  factors: Diabetes mellitus. Hypercholesterolemia.  ------------------------------------------------------------ Study Conclusions  Left ventricle: The cavity size was normal. There was mild concentric hypertrophy. Systolic function was normal. The estimated ejection fraction was in the range of 55% to 60%. Wall motion was normal; there were no regional wall motion abnormalities. Transthoracic echocardiography. M-mode, complete 2D, spectral Doppler, and color Doppler. Height: Height: 170.2cm. Height: 67in. Weight: Weight: 81.2kg. Weight: 178.6lb. Body mass index: BMI: 28kg/m^2. Body surface area: BSA: 1.34m^2. Blood pressure: 132/70. Patient status: Outpatient. Location: [place] [facility]  ------------------------------------------------------------  ------------------------------------------------------------ Left ventricle: The cavity size was normal. There was mild concentric hypertrophy. Systolic function was normal. The estimated ejection fraction was in the range of 55% to 60%. Wall motion was normal; there were no regional wall motion abnormalities. Abnormal relaxation with normal filling pressures.  ------------------------------------------------------------ Aortic valve: Trileaflet; mildly thickened, mildly calcified leaflets. Mobility was not restricted. Sclerosis without stenosis. Doppler: Transvalvular velocity was within the normal range. There was no stenosis. No regurgitation.  ------------------------------------------------------------ Aorta: Aortic root: The aortic root was normal in size. Ascending aorta: The ascending aorta was normal in size.  ------------------------------------------------------------ Mitral valve: Structurally normal valve. Leaflet separation was normal. Mobility was not restricted. Doppler: Transvalvular velocity was within the normal range. There was no evidence for stenosis.  No regurgitation.  ------------------------------------------------------------ Left atrium: The atrium was at the upper limits of normal in size.  ------------------------------------------------------------ Atrial septum: Poorly visualized.  ------------------------------------------------------------ Right ventricle: The cavity size was normal. Wall thickness was normal. Systolic function was normal.  ------------------------------------------------------------ Pulmonic valve: The valve appears to be grossly normal. Doppler: Transvalvular velocity was within the normal range. There was no evidence for stenosis. Mild regurgitation.  ------------------------------------------------------------ Tricuspid valve: Structurally normal valve. Leaflet separation was normal. Doppler: Transvalvular velocity was within the normal range. No regurgitation.  ------------------------------------------------------------ Pulmonary artery: The main pulmonary artery was normal-sized. Systolic  pressure could not be accurately estimated.  ------------------------------------------------------------ Right atrium: The atrium was normal in size.  ------------------------------------------------------------ Pericardium: There was no pericardial effusion.  ------------------------------------------------------------ Systemic veins: Inferior vena cava: The vessel was normal in size.  ------------------------------------------------------------  2D measurements Normal Doppler measurements Normal Left ventricle Left ventricle LVID ED, 37.5 mm 43-52 Ea, lat 6.58 cm/s ------ chord, ann, tiss PLAX DP LVID ES, 21.6 mm 23-38 E/Ea, lat 10.26 ------ chord, ann, tiss PLAX DP FS, chord, 42 % >29 Ea, med 6.36 cm/s ------ PLAX ann, tiss LVPW, ED 13.4 mm ------ DP IVS/LVPW 1.03 <1.3 E/Ea, med 10.61 ------ ratio, ED ann, tiss Ventricular septum DP IVS, ED 13.8 mm ------ LVOT LVOT Peak vel, 149 cm/s  ------ Diam, S 21 mm ------ S Area 3.46 cm^2 ------ VTI, S 30 cm ------ Diam 21 mm ------ Peak 9 mm Hg ------ Aorta gradient, Root diam, 36 mm ------ S ED Stroke vol 103.9 ml ------ AAo AP 38 mm ------ Stroke 53.8 ml/m^ ------ diam, S index 2 Left atrium Mitral valve AP dim 35 mm ------ Peak E vel 67.5 cm/s ------ AP dim 1.81 cm/m^2 <2.2 Peak A vel 92.9 cm/s ------ index Decelerati 320 ms 150-23 on time 0 Peak E/A 0.7 ------ ratio Right ventricle Sa vel, 13.8 cm/s ------ lat ann, tiss DP Pulmonic valve Regurg 141 cm/s ------ vel, ED  ------------------------------------------------------------ Prepared and Electronically Authenticated by  Croitoru, Mihai     Assessment / Plan:      1. Multivessel CAD with increasing exertional symptoms. Cabg best treatment option with DM and multivessel CAD. Risks and options discussed with patient and wife.   The goals risks and alternatives of the planned surgical procedure CABG have been discussed with the patient in detail. The risks of the procedure including death, infection, stroke, myocardial infarction, bleeding, blood transfusion have all been discussed specifically.  I have quoted [name] [name] a 2 % of perioperative mortality and a complication rate as high as 33 %. The patient's questions have been answered.[name] [name] is willing  to proceed with the planned procedure. Plan for Friday.  Grace Isaac MD      [address] [place] [address] Office [phone]   Beeper [phone]

## 2013-08-21 NOTE — Progress Notes (Signed)
Radial band removed from right wrist, site level 0.  Dressing applied.  Will continue to monitor. [place], Ardeth Sportsman

## 2013-08-21 NOTE — CV Procedure (Addendum)
Cardiac Catheterization Operative Report  [name] [name] [id number] 1/14/20159:02 AM [name], MD  Procedure Performed:  1. Left Heart Catheterization 2. Selective Coronary Angiography 3. Left ventricular angiogram  Operator: Lauree Chandler, MD  Arterial access site:  Right radial artery.   Indication:  73 yo male with history of HTN, HLD, DM who is here today for cardiac cath with recent chest pain concerning for unstable angina. Over the last 3-4 months, he has been experiencing exertional substernal chest tightness and burning sensation. The pain occurs when he is on the treadmill, starting in the left chest wall and radiating to his left shoulder and arm. He was seen by Dr. Fletcher Anon in our [facility] office 07/19/13 and had an exercise treadmill stress test with ST depression and chest pain. Cardiac cath was recommended but he wanted to think about this. I saw him in the office last week and he agreed to cath.                                    Procedure Details: The risks, benefits, complications, treatment options, and expected outcomes were discussed with the patient. The patient and/or family concurred with the proposed plan, giving informed consent. The patient was brought to the cath lab after IV hydration was begun and oral premedication was given. The patient was further sedated with Versed and Fentanyl. The right wrist was assessed with an Allens test which was positive. The right wrist was prepped and draped in a sterile fashion. 1% lidocaine was used for local anesthesia. Using the modified Seldinger access technique, a 5 French sheath was placed in the right radial artery. 3 mg Verapamil was given through the sheath. 4000 units IV heparin was given. Standard diagnostic catheters were used to perform selective coronary angiography. A pigtail catheter was used to perform a left ventricular angiogram. The sheath was removed from the right radial artery and a Terumo  hemostasis band was applied at the arteriotomy site on the right wrist.    There were no immediate complications. The patient was taken to the recovery area in stable condition.   Hemodynamic Findings: Central aortic pressure: 159/79 Left ventricular pressure: 157/10/18  Angiographic Findings:  Left main:  No obstructive disease.   Left Anterior Descending Artery: Large caliber vessel that courses to the apex and gives off a moderate caliber diagonal branch. The mid LAD has 50% stenosis followed by a focal 70-80% stenosis. The distal vessel has diffuse disease with serial 90% stenoses just before the vessel wraps around the apex. The diagonal branch is moderate in caliber with 99% stenosis in the mid vessel just before a bifurcation.   Circumflex Artery: Large caliber vessel with 99% proximal stenosis. The mid vessel has diffuse 40% stenosis. There is a moderate to large caliber obtuse marginal branch that arises distally with 80% proximal stenosis. The AV groove Circumflex beyond the takeoff of the OM branch has 99% stenosis.   Right Coronary Artery: Large caliber, dominant vessel with 100% proximal occlusion after a large conus branch that supplies a portion of the RV wall. The proximal, mid and distal vessel fills from left to right collaterals.   Left Ventricular Angiogram: LVEF=60%.   Impression: 1. Severe triple vessel CAD 2. Unstable angina 3. Preserved LV systolic function  Recommendations: Multivessel CAD in a diabetic patient presenting with unstable angina. Will consult CT surgery for CABG. Will admit to  telemetry unit today. He does not tolerate ASA secondary to "bleeding in eyes" in past. Both patient and wife note that he has had vessel rupture in eyes with even one day of ASA 81 mg. Will hold off on ASA for now. He is intolerant of statins. No beta blocker with bradycardia. Of note, he has been on Cipro for abdominal pain and presumed prostatitis.        Complications:   None. The patient tolerated the procedure well.

## 2013-08-22 ENCOUNTER — Ambulatory Visit (HOSPITAL_COMMUNITY): Payer: Medicare Other

## 2013-08-22 DIAGNOSIS — I200 Unstable angina: Secondary | ICD-10-CM | POA: Diagnosis present

## 2013-08-22 DIAGNOSIS — R0789 Other chest pain: Secondary | ICD-10-CM | POA: Diagnosis present

## 2013-08-22 DIAGNOSIS — K7689 Other specified diseases of liver: Secondary | ICD-10-CM | POA: Diagnosis present

## 2013-08-22 DIAGNOSIS — E1142 Type 2 diabetes mellitus with diabetic polyneuropathy: Secondary | ICD-10-CM | POA: Diagnosis present

## 2013-08-22 DIAGNOSIS — K219 Gastro-esophageal reflux disease without esophagitis: Secondary | ICD-10-CM | POA: Diagnosis present

## 2013-08-22 DIAGNOSIS — I4891 Unspecified atrial fibrillation: Secondary | ICD-10-CM | POA: Diagnosis not present

## 2013-08-22 DIAGNOSIS — Z8249 Family history of ischemic heart disease and other diseases of the circulatory system: Secondary | ICD-10-CM | POA: Diagnosis not present

## 2013-08-22 DIAGNOSIS — F411 Generalized anxiety disorder: Secondary | ICD-10-CM | POA: Diagnosis present

## 2013-08-22 DIAGNOSIS — I2510 Atherosclerotic heart disease of native coronary artery without angina pectoris: Secondary | ICD-10-CM | POA: Diagnosis present

## 2013-08-22 DIAGNOSIS — M1990 Unspecified osteoarthritis, unspecified site: Secondary | ICD-10-CM | POA: Diagnosis present

## 2013-08-22 DIAGNOSIS — E785 Hyperlipidemia, unspecified: Secondary | ICD-10-CM | POA: Diagnosis present

## 2013-08-22 DIAGNOSIS — E1149 Type 2 diabetes mellitus with other diabetic neurological complication: Secondary | ICD-10-CM | POA: Diagnosis present

## 2013-08-22 DIAGNOSIS — J9819 Other pulmonary collapse: Secondary | ICD-10-CM | POA: Diagnosis not present

## 2013-08-22 DIAGNOSIS — E7800 Pure hypercholesterolemia, unspecified: Secondary | ICD-10-CM | POA: Diagnosis present

## 2013-08-22 DIAGNOSIS — I519 Heart disease, unspecified: Secondary | ICD-10-CM | POA: Diagnosis not present

## 2013-08-22 DIAGNOSIS — D696 Thrombocytopenia, unspecified: Secondary | ICD-10-CM | POA: Diagnosis not present

## 2013-08-22 DIAGNOSIS — D62 Acute posthemorrhagic anemia: Secondary | ICD-10-CM | POA: Diagnosis not present

## 2013-08-22 DIAGNOSIS — E8779 Other fluid overload: Secondary | ICD-10-CM | POA: Diagnosis not present

## 2013-08-22 DIAGNOSIS — I10 Essential (primary) hypertension: Secondary | ICD-10-CM | POA: Diagnosis present

## 2013-08-22 LAB — URINALYSIS, ROUTINE W REFLEX MICROSCOPIC
Bilirubin Urine: NEGATIVE
Glucose, UA: NEGATIVE mg/dL
Hgb urine dipstick: NEGATIVE
Ketones, ur: NEGATIVE mg/dL
Leukocytes, UA: NEGATIVE
Nitrite: NEGATIVE
Protein, ur: NEGATIVE mg/dL
Specific Gravity, Urine: 1.02 (ref 1.005–1.030)
Urobilinogen, UA: 0.2 mg/dL (ref 0.0–1.0)
pH: 7.5 (ref 5.0–8.0)

## 2013-08-22 LAB — CBC WITH DIFFERENTIAL/PLATELET
Basophils Absolute: 0 10*3/uL (ref 0.0–0.1)
Basophils Relative: 0 % (ref 0–1)
Eosinophils Absolute: 0.2 10*3/uL (ref 0.0–0.7)
Eosinophils Relative: 3 % (ref 0–5)
HCT: 44.2 % (ref 39.0–52.0)
Hemoglobin: 15.6 g/dL (ref 13.0–17.0)
Lymphocytes Relative: 44 % (ref 12–46)
Lymphs Abs: 2.6 10*3/uL (ref 0.7–4.0)
MCH: 32.3 pg (ref 26.0–34.0)
MCHC: 35.3 g/dL (ref 30.0–36.0)
MCV: 91.5 fL (ref 78.0–100.0)
Monocytes Absolute: 0.4 10*3/uL (ref 0.1–1.0)
Monocytes Relative: 7 % (ref 3–12)
Neutro Abs: 2.7 10*3/uL (ref 1.7–7.7)
Neutrophils Relative %: 47 % (ref 43–77)
Platelets: 154 10*3/uL (ref 150–400)
RBC: 4.83 MIL/uL (ref 4.22–5.81)
RDW: 12.8 % (ref 11.5–15.5)
WBC: 5.9 10*3/uL (ref 4.0–10.5)

## 2013-08-22 LAB — BLOOD GAS, ARTERIAL
Acid-Base Excess: 0.1 mmol/L (ref 0.0–2.0)
Bicarbonate: 23.8 mEq/L (ref 20.0–24.0)
Drawn by: 244801
FIO2: 0.21 %
O2 Saturation: 97.1 %
Patient temperature: 98.6
TCO2: 24.9 mmol/L (ref 0–100)
pCO2 arterial: 36.2 mmHg (ref 35.0–45.0)
pH, Arterial: 7.433 (ref 7.350–7.450)
pO2, Arterial: 85.9 mmHg (ref 80.0–100.0)

## 2013-08-22 LAB — GLUCOSE, CAPILLARY
GLUCOSE-CAPILLARY: 131 mg/dL — AB (ref 70–99)
GLUCOSE-CAPILLARY: 129 mg/dL — AB (ref 70–99)
GLUCOSE-CAPILLARY: 128 mg/dL — AB (ref 70–99)
Glucose-Capillary: 111 mg/dL — ABNORMAL HIGH (ref 70–99)
Glucose-Capillary: 129 mg/dL — ABNORMAL HIGH (ref 70–99)

## 2013-08-22 LAB — SURGICAL PCR SCREEN
MRSA, PCR: NEGATIVE
STAPHYLOCOCCUS AUREUS: NEGATIVE

## 2013-08-22 LAB — ABO/RH: ABO/RH(D): O NEG

## 2013-08-22 LAB — COMPREHENSIVE METABOLIC PANEL
ALT: 26 U/L (ref 0–53)
AST: 24 U/L (ref 0–37)
Albumin: 4.3 g/dL (ref 3.5–5.2)
Alkaline Phosphatase: 66 U/L (ref 39–117)
BUN: 15 mg/dL (ref 6–23)
CO2: 28 mEq/L (ref 19–32)
Calcium: 9.7 mg/dL (ref 8.4–10.5)
Chloride: 101 mEq/L (ref 96–112)
Creatinine, Ser: 0.88 mg/dL (ref 0.50–1.35)
GFR calc Af Amer: >90 mL/min (ref >90)
GFR calc non Af Amer: 83 mL/min — ABNORMAL LOW (ref >90)
Glucose, Bld: 159 mg/dL — ABNORMAL HIGH (ref 70–99)
Potassium: 4 mEq/L (ref 3.7–5.3)
Sodium: 142 mEq/L (ref 137–147)
Total Bilirubin: 1 mg/dL (ref 0.3–1.2)
Total Protein: 7.5 g/dL (ref 6.0–8.3)

## 2013-08-22 LAB — PROTIME-INR
INR: 1.04 (ref 0.00–1.49)
Prothrombin Time: 13.4 seconds (ref 11.6–15.2)

## 2013-08-22 LAB — HEMOGLOBIN A1C
Hgb A1c MFr Bld: 6.6 % — ABNORMAL HIGH (ref <5.7)
Mean Plasma Glucose: 143 mg/dL — ABNORMAL HIGH (ref <117)

## 2013-08-22 LAB — TYPE AND SCREEN
ABO/RH(D): O NEG
Antibody Screen: NEGATIVE

## 2013-08-22 LAB — APTT: aPTT: 28 seconds (ref 24–37)

## 2013-08-22 MED ORDER — SODIUM CHLORIDE 0.9 % IV SOLN
INTRAVENOUS | Status: AC
  Administered 2013-08-23: 2.6 [IU]/h via INTRAVENOUS
  Filled 2013-08-22: qty 1

## 2013-08-22 MED ORDER — CHLORHEXIDINE GLUCONATE 4 % EX LIQD
60.0000 mL | Freq: Once | CUTANEOUS | Status: AC
  Administered 2013-08-23: 4 via TOPICAL
  Filled 2013-08-22: qty 60

## 2013-08-22 MED ORDER — EPINEPHRINE HCL 1 MG/ML IJ SOLN
0.5000 ug/min | INTRAVENOUS | Status: DC
  Filled 2013-08-22: qty 4

## 2013-08-22 MED ORDER — VANCOMYCIN HCL 10 G IV SOLR
1250.0000 mg | INTRAVENOUS | Status: AC
  Administered 2013-08-23: 1250 mg via INTRAVENOUS
  Filled 2013-08-22: qty 1250

## 2013-08-22 MED ORDER — PHENYLEPHRINE HCL 10 MG/ML IJ SOLN
30.0000 ug/min | INTRAVENOUS | Status: AC
  Administered 2013-08-23: 40 ug/min via INTRAVENOUS
  Filled 2013-08-22: qty 2

## 2013-08-22 MED ORDER — SODIUM CHLORIDE 0.9 % IV SOLN
INTRAVENOUS | Status: DC
  Filled 2013-08-22: qty 30

## 2013-08-22 MED ORDER — GABAPENTIN 600 MG PO TABS
600.0000 mg | ORAL_TABLET | Freq: Two times a day (BID) | ORAL | Status: DC
  Administered 2013-08-22 – 2013-08-25 (×6): 600 mg via ORAL
  Filled 2013-08-22 (×10): qty 1

## 2013-08-22 MED ORDER — DEXMEDETOMIDINE HCL IN NACL 400 MCG/100ML IV SOLN
0.1000 ug/kg/h | INTRAVENOUS | Status: AC
  Administered 2013-08-23: 0.2 ug/kg/h via INTRAVENOUS
  Filled 2013-08-22: qty 100

## 2013-08-22 MED ORDER — MAGNESIUM SULFATE 50 % IJ SOLN
40.0000 meq | INTRAMUSCULAR | Status: DC
  Filled 2013-08-22: qty 10

## 2013-08-22 MED ORDER — SODIUM CHLORIDE 0.9 % IV SOLN
INTRAVENOUS | Status: AC
  Administered 2013-08-23: 70 mL/h via INTRAVENOUS
  Filled 2013-08-22: qty 40

## 2013-08-22 MED ORDER — DEXTROSE 5 % IV SOLN
1.5000 g | INTRAVENOUS | Status: AC
  Administered 2013-08-23: .75 g via INTRAVENOUS
  Administered 2013-08-23: 1.5 g via INTRAVENOUS
  Filled 2013-08-22: qty 1.5

## 2013-08-22 MED ORDER — BISACODYL 5 MG PO TBEC: 5.0000 mg | DELAYED_RELEASE_TABLET | Freq: Once | ORAL | Status: DC

## 2013-08-22 MED ORDER — POTASSIUM CHLORIDE 2 MEQ/ML IV SOLN
80.0000 meq | INTRAVENOUS | Status: DC
  Filled 2013-08-22: qty 40

## 2013-08-22 MED ORDER — CHLORHEXIDINE GLUCONATE 4 % EX LIQD
60.0000 mL | Freq: Once | CUTANEOUS | Status: AC
  Administered 2013-08-22: 4 via TOPICAL
  Filled 2013-08-22: qty 60

## 2013-08-22 MED ORDER — DEXTROSE 5 % IV SOLN
750.0000 mg | INTRAVENOUS | Status: DC
  Filled 2013-08-22: qty 750

## 2013-08-22 MED ORDER — TEMAZEPAM 15 MG PO CAPS: 15.0000 mg | ORAL_CAPSULE | Freq: Once | ORAL | Status: AC | PRN

## 2013-08-22 MED ORDER — METOPROLOL TARTRATE 12.5 MG HALF TABLET
12.5000 mg | ORAL_TABLET | Freq: Once | ORAL | Status: AC
  Administered 2013-08-23: 12.5 mg via ORAL
  Filled 2013-08-22: qty 1

## 2013-08-22 MED ORDER — DOPAMINE-DEXTROSE 3.2-5 MG/ML-% IV SOLN
2.0000 ug/kg/min | INTRAVENOUS | Status: DC
  Filled 2013-08-22: qty 250

## 2013-08-22 MED ORDER — PLASMA-LYTE 148 IV SOLN
INTRAVENOUS | Status: AC
  Administered 2013-08-23: 09:00:00
  Filled 2013-08-22: qty 2.5

## 2013-08-22 MED ORDER — NITROGLYCERIN IN D5W 200-5 MCG/ML-% IV SOLN
2.0000 ug/min | INTRAVENOUS | Status: AC
  Administered 2013-08-23: 25 ug/min via INTRAVENOUS
  Filled 2013-08-22: qty 250

## 2013-08-22 NOTE — Progress Notes (Signed)
1040-1100 Pt has been walking independently. Discussed importance of mobility and IS after surgery,. Discussed sternal precautions and practicing getting up and down without use of arms. Has [facility] booklet and knows to watch preop video later when wife here. Gave [facility] information sheet and encouraged family to read. Will follow up after surgery. Graylon Good RN BSN 08/22/2013 11:00 AM

## 2013-08-22 NOTE — Progress Notes (Signed)
Utilization review completed.  

## 2013-08-22 NOTE — Progress Notes (Addendum)
      [address]       [place], [address]             [phone]                 1 Day Post-Op Procedure(s) (LRB): LEFT HEART CATHETERIZATION WITH CORONARY ANGIOGRAM (N/A)  LOS: 1 day   Subjective: No chest pain, feels well today  Objective: Vital signs in last 24 hours: Patient Vitals for the past 24 hrs:  BP Temp Temp src Pulse Resp SpO2  08/22/13 0507 140/59 mmHg 97.5 F (36.4 C) Oral 54 18 94 %  08/21/13 2145 131/76 mmHg 97.6 F (36.4 C) Oral 60 18 97 %  08/21/13 1115 138/58 mmHg - - 55 - 96 %  08/21/13 1045 147/68 mmHg - - 57 - -  08/21/13 1016 - - - 58 - 97 %  08/21/13 1000 112/64 mmHg - - 56 - 97 %  08/21/13 0945 153/76 mmHg - - 52 - 97 %  08/21/13 0941 104/83 mmHg - - 57 - 97 %  08/21/13 0844 - - - 49 - -    Filed Weights   08/21/13 0708  Weight: 179 lb (81.194 kg)    Hemodynamic parameters for last 24 hours:    Intake/Output from previous day: 01/14 0701 - 01/15 0700 In: -  Out: 1450 [Urine:1450] Intake/Output this shift:    Scheduled Meds: . acyclovir  400 mg Oral BID  . ciprofloxacin  250 mg Oral BID  . gabapentin  600 mg Oral BID  . losartan  100 mg Oral Daily  . psyllium  1 packet Oral Daily   Continuous Infusions:  PRN Meds:.acetaminophen, morphine injection, nitroGLYCERIN, ondansetron (ZOFRAN) IV, oxyCODONE-acetaminophen  General appearance: alert and cooperative Neurologic: intact Heart: regular rate and rhythm, S1, S2 normal, no murmur, click, rub or gallop Lungs: clear to auscultation bilaterally Abdomen: soft, non-tender; bowel sounds normal; no masses,  no organomegaly Extremities: extremities normal, atraumatic, no cyanosis or edema and Homans sign is negative, no sign of DVT  Lab Results: CBC:No results found for this basename: WBC, HGB, HCT, PLT,  in the last 72 hours BMET: No results found for this basename: NA, K, CL, CO2, GLUCOSE, BUN, CREATININE, CALCIUM,  in the last 72 hours  PT/INR: No results found  for this basename: LABPROT, INR,  in the last 72 hours Lab Results  Component Value Date   WBC 7.1 08/12/2013   HGB 14.4 08/12/2013   HCT 41.9 08/12/2013   PLT 151.0 08/12/2013   GLUCOSE 123* 08/12/2013   CHOL 147 07/08/2013   TRIG 217.0* 07/08/2013   HDL 30.30* 07/08/2013   LDLDIRECT 79.8 07/08/2013   LDLCALC 73 06/04/2012   ALT 28 07/08/2013   AST 19 07/08/2013   NA 140 08/12/2013   K 4.5 08/12/2013   CL 105 08/12/2013   CREATININE 1.0 08/12/2013   BUN 16 08/12/2013   CO2 30 08/12/2013   TSH 1.40 12/15/2011   PSA 0.27 07/01/2013   INR 1.1* 08/12/2013   HGBA1C 7.6* 07/08/2013     Radiology No results found.   Assessment/Plan: S/P Procedure(s) (LRB): LEFT HEART CATHETERIZATION WITH CORONARY ANGIOGRAM (N/A) For CABG in am  Patient reading information book this am, no further questions Agreeable with proceeding as outlined last pm  Grace Isaac MD 08/22/2013 8:34 AM

## 2013-08-22 NOTE — Progress Notes (Signed)
   SUBJECTIVE:  No chest pain   PHYSICAL EXAM Filed Vitals:   08/21/13 1045 08/21/13 1115 08/21/13 2145 08/22/13 0507  BP: 147/68 138/58 131/76 140/59  Pulse: 57 55 60 54  Temp:   97.6 F (36.4 C) 97.5 F (36.4 C)  TempSrc:   Oral Oral  Resp:   18 18  Height:      Weight:      SpO2:  96% 97% 94%   General:  No distress Lungs:  Clear Heart:  RRR Abdomen:  Positive bowel sounds, no rebound no guarding Extremities:  No edema  LABS:  Results for orders placed during the hospital encounter of 08/21/13 (from the past 24 hour(s))  GLUCOSE, CAPILLARY     Status: Abnormal   Collection Time    08/21/13 11:37 AM      Result Value Range   Glucose-Capillary 183 (*) 70 - 99 mg/dL   Comment 1 Notify RN     Comment 2 Documented in Chart    GLUCOSE, CAPILLARY     Status: Abnormal   Collection Time    08/21/13  4:06 PM      Result Value Range   Glucose-Capillary 110 (*) 70 - 99 mg/dL   Comment 1 Notify RN     Comment 2 Documented in Chart    GLUCOSE, CAPILLARY     Status: Abnormal   Collection Time    08/21/13  9:19 PM      Result Value Range   Glucose-Capillary 131 (*) 70 - 99 mg/dL   Comment 1 Notify RN    SURGICAL PCR SCREEN     Status: None   Collection Time    08/22/13  6:46 AM      Result Value Range   MRSA, PCR NEGATIVE  NEGATIVE   Staphylococcus aureus NEGATIVE  NEGATIVE  GLUCOSE, CAPILLARY     Status: Abnormal   Collection Time    08/22/13  7:31 AM      Result Value Range   Glucose-Capillary 129 (*) 70 - 99 mg/dL   Comment 1 Notify RN      Intake/Output Summary (Last 24 hours) at 08/22/13 1025 Last data filed at 08/22/13 8527  Gross per 24 hour  Intake      0 ml  Output   1450 ml  Net  -1450 ml   ASSESSMENT AND PLAN:  UNSTABLE ANGINA:  CABG in the AM.   Unable to take ASA.  PROSTATITIS: Continue Cipro  HTN:  BP OK.  Continue current therapy.   HYPERLIPIDEMIA:  Unable to take statins.    DM:   Reasonable blood sugar control.  We will follow       Minus Breeding 08/22/2013 9:23 AM

## 2013-08-23 ENCOUNTER — Inpatient Hospital Stay (HOSPITAL_COMMUNITY): Payer: Medicare Other

## 2013-08-23 ENCOUNTER — Encounter (HOSPITAL_COMMUNITY)
Admission: RE | Disposition: A | Discharge status: Home / Self Care | Payer: Medicare Other | Source: Ambulatory Visit | Attending: Cardiothoracic Surgery

## 2013-08-23 ENCOUNTER — Inpatient Hospital Stay (HOSPITAL_COMMUNITY): Payer: Medicare Other | Admitting: Certified Registered"

## 2013-08-23 ENCOUNTER — Encounter (HOSPITAL_COMMUNITY): Payer: Self-pay | Admitting: Certified Registered"

## 2013-08-23 ENCOUNTER — Encounter (HOSPITAL_COMMUNITY): Payer: Medicare Other | Admitting: Certified Registered"

## 2013-08-23 DIAGNOSIS — R079 Chest pain, unspecified: Secondary | ICD-10-CM

## 2013-08-23 DIAGNOSIS — Z951 Presence of aortocoronary bypass graft: Secondary | ICD-10-CM

## 2013-08-23 DIAGNOSIS — I2510 Atherosclerotic heart disease of native coronary artery without angina pectoris: Secondary | ICD-10-CM

## 2013-08-23 HISTORY — PX: INTRAOPERATIVE TRANSESOPHAGEAL ECHOCARDIOGRAM: SHX5062

## 2013-08-23 HISTORY — PX: ENDOVEIN HARVEST OF GREATER SAPHENOUS VEIN: SHX5059

## 2013-08-23 HISTORY — PX: CORONARY ARTERY BYPASS GRAFT: SHX141

## 2013-08-23 LAB — CBC
HCT: 41.6 % (ref 39.0–52.0)
HCT: 35.5 % — ABNORMAL LOW (ref 39.0–52.0)
HCT: 34.4 % — ABNORMAL LOW (ref 39.0–52.0)
Hemoglobin: 14.8 g/dL (ref 13.0–17.0)
Hemoglobin: 12.8 g/dL — ABNORMAL LOW (ref 13.0–17.0)
Hemoglobin: 12.1 g/dL — ABNORMAL LOW (ref 13.0–17.0)
MCH: 31.8 pg (ref 26.0–34.0)
MCH: 32 pg (ref 26.0–34.0)
MCH: 31.9 pg (ref 26.0–34.0)
MCHC: 35.6 g/dL (ref 30.0–36.0)
MCHC: 36.1 g/dL — ABNORMAL HIGH (ref 30.0–36.0)
MCHC: 35.2 g/dL (ref 30.0–36.0)
MCV: 89.5 fL (ref 78.0–100.0)
MCV: 88.8 fL (ref 78.0–100.0)
MCV: 90.8 fL (ref 78.0–100.0)
Platelets: 150 10*3/uL (ref 150–400)
Platelets: 104 10*3/uL — ABNORMAL LOW (ref 150–400)
Platelets: 122 10*3/uL — ABNORMAL LOW (ref 150–400)
RBC: 4.65 MIL/uL (ref 4.22–5.81)
RBC: 4 MIL/uL — ABNORMAL LOW (ref 4.22–5.81)
RBC: 3.79 MIL/uL — ABNORMAL LOW (ref 4.22–5.81)
RDW: 12.5 % (ref 11.5–15.5)
RDW: 12.5 % (ref 11.5–15.5)
RDW: 12.8 % (ref 11.5–15.5)
WBC: 6.6 10*3/uL (ref 4.0–10.5)
WBC: 11 10*3/uL — AB (ref 4.0–10.5)
WBC: 12.3 10*3/uL — ABNORMAL HIGH (ref 4.0–10.5)

## 2013-08-23 LAB — POCT I-STAT 3, ART BLOOD GAS (G3+)
ACID-BASE DEFICIT: 1 mmol/L (ref 0.0–2.0)
ACID-BASE DEFICIT: 3 mmol/L — AB (ref 0.0–2.0)
Acid-base deficit: 1 mmol/L (ref 0.0–2.0)
Acid-base deficit: 2 mmol/L (ref 0.0–2.0)
Bicarbonate: 24.1 mEq/L — ABNORMAL HIGH (ref 20.0–24.0)
Bicarbonate: 24.1 mEq/L — ABNORMAL HIGH (ref 20.0–24.0)
Bicarbonate: 25.1 mEq/L — ABNORMAL HIGH (ref 20.0–24.0)
Bicarbonate: 21.8 mEq/L (ref 20.0–24.0)
Bicarbonate: 23 mEq/L (ref 20.0–24.0)
O2 SAT: 100 %
O2 Saturation: 99 %
O2 Saturation: 90 %
O2 Saturation: 89 %
O2 Saturation: 91 %
PCO2 ART: 31.3 mmHg — AB (ref 35.0–45.0)
PH ART: 7.341 — AB (ref 7.350–7.450)
PH ART: 7.337 — AB (ref 7.350–7.450)
PO2 ART: 64 mmHg — AB (ref 80.0–100.0)
Patient temperature: 38.2
Patient temperature: 38.6
TCO2: 25 mmol/L (ref 0–100)
TCO2: 25 mmol/L (ref 0–100)
TCO2: 26 mmol/L (ref 0–100)
TCO2: 23 mmol/L (ref 0–100)
TCO2: 24 mmol/L (ref 0–100)
pCO2 arterial: 40.2 mmHg (ref 35.0–45.0)
pCO2 arterial: 44.6 mmHg (ref 35.0–45.0)
pCO2 arterial: 40.8 mmHg (ref 35.0–45.0)
pCO2 arterial: 43.7 mmHg (ref 35.0–45.0)
pH, Arterial: 7.482 — ABNORMAL HIGH (ref 7.350–7.450)
pH, Arterial: 7.385 (ref 7.350–7.450)
pH, Arterial: 7.355 (ref 7.350–7.450)
pO2, Arterial: 343 mmHg — ABNORMAL HIGH (ref 80.0–100.0)
pO2, Arterial: 118 mmHg — ABNORMAL HIGH (ref 80.0–100.0)
pO2, Arterial: 59 mmHg — ABNORMAL LOW (ref 80.0–100.0)
pO2, Arterial: 72 mmHg — ABNORMAL LOW (ref 80.0–100.0)

## 2013-08-23 LAB — POCT I-STAT 3, VENOUS BLOOD GAS (G3P V)
ACID-BASE DEFICIT: 2 mmol/L (ref 0.0–2.0)
BICARBONATE: 22.6 meq/L (ref 20.0–24.0)
O2 Saturation: 77 %
Patient temperature: 34
TCO2: 24 mmol/L (ref 0–100)
pCO2, Ven: 32.1 mmHg — ABNORMAL LOW (ref 45.0–50.0)
pH, Ven: 7.443 — ABNORMAL HIGH (ref 7.250–7.300)
pO2, Ven: 33 mmHg (ref 30.0–45.0)

## 2013-08-23 LAB — BASIC METABOLIC PANEL
BUN: 17 mg/dL (ref 6–23)
CO2: 23 mEq/L (ref 19–32)
Calcium: 9.2 mg/dL (ref 8.4–10.5)
Chloride: 103 mEq/L (ref 96–112)
Creatinine, Ser: 0.93 mg/dL (ref 0.50–1.35)
GFR calc Af Amer: >90 mL/min (ref >90)
GFR calc non Af Amer: 81 mL/min — ABNORMAL LOW (ref >90)
Glucose, Bld: 143 mg/dL — ABNORMAL HIGH (ref 70–99)
Potassium: 3.9 mEq/L (ref 3.7–5.3)
Sodium: 141 mEq/L (ref 137–147)

## 2013-08-23 LAB — POCT I-STAT 4, (NA,K, GLUC, HGB,HCT)
GLUCOSE: 157 mg/dL — AB (ref 70–99)
GLUCOSE: 145 mg/dL — AB (ref 70–99)
Glucose, Bld: 146 mg/dL — ABNORMAL HIGH (ref 70–99)
Glucose, Bld: 159 mg/dL — ABNORMAL HIGH (ref 70–99)
Glucose, Bld: 165 mg/dL — ABNORMAL HIGH (ref 70–99)
Glucose, Bld: 110 mg/dL — ABNORMAL HIGH (ref 70–99)
HCT: 41 % (ref 39.0–52.0)
HCT: 37 % — ABNORMAL LOW (ref 39.0–52.0)
HCT: 27 % — ABNORMAL LOW (ref 39.0–52.0)
HCT: 26 % — ABNORMAL LOW (ref 39.0–52.0)
HCT: 35 % — ABNORMAL LOW (ref 39.0–52.0)
HEMATOCRIT: 30 % — AB (ref 39.0–52.0)
HEMOGLOBIN: 12.6 g/dL — AB (ref 13.0–17.0)
HEMOGLOBIN: 10.2 g/dL — AB (ref 13.0–17.0)
Hemoglobin: 13.9 g/dL (ref 13.0–17.0)
Hemoglobin: 9.2 g/dL — ABNORMAL LOW (ref 13.0–17.0)
Hemoglobin: 8.8 g/dL — ABNORMAL LOW (ref 13.0–17.0)
Hemoglobin: 11.9 g/dL — ABNORMAL LOW (ref 13.0–17.0)
POTASSIUM: 4.3 meq/L (ref 3.7–5.3)
POTASSIUM: 4.2 meq/L (ref 3.7–5.3)
POTASSIUM: 4.2 meq/L (ref 3.7–5.3)
Potassium: 3.7 mEq/L (ref 3.7–5.3)
Potassium: 3.5 mEq/L — ABNORMAL LOW (ref 3.7–5.3)
Potassium: 3.7 mEq/L (ref 3.7–5.3)
SODIUM: 137 meq/L (ref 137–147)
SODIUM: 138 meq/L (ref 137–147)
Sodium: 141 mEq/L (ref 137–147)
Sodium: 140 mEq/L (ref 137–147)
Sodium: 138 mEq/L (ref 137–147)
Sodium: 142 mEq/L (ref 137–147)

## 2013-08-23 LAB — HEMOGLOBIN AND HEMATOCRIT, BLOOD
HCT: 29.6 % — ABNORMAL LOW (ref 39.0–52.0)
Hemoglobin: 10.5 g/dL — ABNORMAL LOW (ref 13.0–17.0)

## 2013-08-23 LAB — PROTIME-INR
INR: 1.39 (ref 0.00–1.49)
Prothrombin Time: 16.7 seconds — ABNORMAL HIGH (ref 11.6–15.2)

## 2013-08-23 LAB — GLUCOSE, CAPILLARY
GLUCOSE-CAPILLARY: 95 mg/dL (ref 70–99)
GLUCOSE-CAPILLARY: 100 mg/dL — AB (ref 70–99)
Glucose-Capillary: 172 mg/dL — ABNORMAL HIGH (ref 70–99)

## 2013-08-23 LAB — POCT I-STAT GLUCOSE
Glucose, Bld: 158 mg/dL — ABNORMAL HIGH (ref 70–99)
Operator id: 3390

## 2013-08-23 LAB — CREATININE, SERUM
Creatinine, Ser: 0.84 mg/dL (ref 0.50–1.35)
GFR calc Af Amer: >90 mL/min (ref >90)
GFR calc non Af Amer: 85 mL/min — ABNORMAL LOW (ref >90)

## 2013-08-23 LAB — POCT I-STAT, CHEM 8
BUN: 13 mg/dL (ref 6–23)
CALCIUM ION: 1.21 mmol/L (ref 1.13–1.30)
CREATININE: 0.8 mg/dL (ref 0.50–1.35)
Chloride: 108 mEq/L (ref 96–112)
GLUCOSE: 114 mg/dL — AB (ref 70–99)
HCT: 32 % — ABNORMAL LOW (ref 39.0–52.0)
HEMOGLOBIN: 10.9 g/dL — AB (ref 13.0–17.0)
Potassium: 3.9 mEq/L (ref 3.7–5.3)
Sodium: 140 mEq/L (ref 137–147)
TCO2: 23 mmol/L (ref 0–100)

## 2013-08-23 LAB — MAGNESIUM: Magnesium: 3.3 mg/dL — ABNORMAL HIGH (ref 1.5–2.5)

## 2013-08-23 LAB — PLATELET COUNT: Platelets: 135 10*3/uL — ABNORMAL LOW (ref 150–400)

## 2013-08-23 LAB — APTT: APTT: 26 s (ref 24–37)

## 2013-08-23 SURGERY — CORONARY ARTERY BYPASS GRAFTING (CABG)
Anesthesia: General | Site: Leg Upper | Laterality: Right

## 2013-08-23 MED ORDER — SUFENTANIL CITRATE 50 MCG/ML IV SOLN
INTRAVENOUS | Status: DC | PRN
  Administered 2013-08-23 (×3): 20 ug via INTRAVENOUS
  Administered 2013-08-23 (×2): 30 ug via INTRAVENOUS

## 2013-08-23 MED ORDER — ACETAMINOPHEN 500 MG PO TABS
1000.0000 mg | ORAL_TABLET | Freq: Four times a day (QID) | ORAL | Status: AC
  Administered 2013-08-23 – 2013-08-28 (×19): 1000 mg via ORAL
  Filled 2013-08-23 (×21): qty 2

## 2013-08-23 MED ORDER — FAMOTIDINE IN NACL 20-0.9 MG/50ML-% IV SOLN
20.0000 mg | Freq: Two times a day (BID) | INTRAVENOUS | Status: AC
  Administered 2013-08-23: 20 mg via INTRAVENOUS

## 2013-08-23 MED ORDER — OXYCODONE HCL 5 MG PO TABS
5.0000 mg | ORAL_TABLET | ORAL | Status: DC | PRN
  Administered 2013-08-24 (×4): 10 mg via ORAL
  Filled 2013-08-23 (×4): qty 2

## 2013-08-23 MED ORDER — ALBUMIN HUMAN 5 % IV SOLN
250.0000 mL | INTRAVENOUS | Status: AC | PRN
  Administered 2013-08-23 – 2013-08-24 (×2): 250 mL via INTRAVENOUS
  Filled 2013-08-23: qty 250

## 2013-08-23 MED ORDER — LACTATED RINGERS IV SOLN
INTRAVENOUS | Status: DC | PRN
  Administered 2013-08-23: 07:00:00 via INTRAVENOUS

## 2013-08-23 MED ORDER — MAGNESIUM SULFATE 40 MG/ML IJ SOLN
4.0000 g | Freq: Once | INTRAMUSCULAR | Status: AC
  Administered 2013-08-23: 4 g via INTRAVENOUS
  Filled 2013-08-23: qty 100

## 2013-08-23 MED ORDER — PANTOPRAZOLE SODIUM 40 MG PO TBEC
40.0000 mg | DELAYED_RELEASE_TABLET | Freq: Every day | ORAL | Status: DC
  Administered 2013-08-25 – 2013-08-30 (×6): 40 mg via ORAL
  Filled 2013-08-23 (×6): qty 1

## 2013-08-23 MED ORDER — HEPARIN SODIUM (PORCINE) 1000 UNIT/ML IJ SOLN
INTRAMUSCULAR | Status: DC | PRN
  Administered 2013-08-23: 20000 [IU] via INTRAVENOUS

## 2013-08-23 MED ORDER — ACETAMINOPHEN 160 MG/5ML PO SOLN: 650.0000 mg | Freq: Once | ORAL | Status: AC

## 2013-08-23 MED ORDER — DOCUSATE SODIUM 100 MG PO CAPS
200.0000 mg | ORAL_CAPSULE | Freq: Every day | ORAL | Status: DC
  Administered 2013-08-24 – 2013-08-27 (×4): 200 mg via ORAL
  Filled 2013-08-23 (×7): qty 2

## 2013-08-23 MED ORDER — ACETAMINOPHEN 160 MG/5ML PO SOLN: 1000.0000 mg | Freq: Four times a day (QID) | ORAL | Status: DC

## 2013-08-23 MED ORDER — BISACODYL 10 MG RE SUPP: 10.0000 mg | Freq: Every day | RECTAL | Status: DC

## 2013-08-23 MED ORDER — FENTANYL CITRATE 0.05 MG/ML IJ SOLN
INTRAMUSCULAR | Status: DC | PRN
  Administered 2013-08-23: 50 ug via INTRAVENOUS

## 2013-08-23 MED ORDER — PROTAMINE SULFATE 10 MG/ML IV SOLN
INTRAVENOUS | Status: DC | PRN
  Administered 2013-08-23: 110 mg via INTRAVENOUS
  Administered 2013-08-23: 100 mg via INTRAVENOUS

## 2013-08-23 MED ORDER — SODIUM CHLORIDE 0.45 % IV SOLN
INTRAVENOUS | Status: DC
  Administered 2013-08-23: 20 mL/h via INTRAVENOUS

## 2013-08-23 MED ORDER — ARTIFICIAL TEARS OP OINT
TOPICAL_OINTMENT | OPHTHALMIC | Status: DC | PRN
  Administered 2013-08-23: 1 via OPHTHALMIC

## 2013-08-23 MED ORDER — METOPROLOL TARTRATE 12.5 MG HALF TABLET
12.5000 mg | ORAL_TABLET | Freq: Two times a day (BID) | ORAL | Status: DC
  Administered 2013-08-24: 12.5 mg via ORAL
  Filled 2013-08-23 (×3): qty 1

## 2013-08-23 MED ORDER — MORPHINE SULFATE 2 MG/ML IJ SOLN
1.0000 mg | INTRAMUSCULAR | Status: AC | PRN
  Administered 2013-08-23: 2 mg via INTRAVENOUS
  Filled 2013-08-23: qty 1

## 2013-08-23 MED ORDER — SODIUM CHLORIDE 0.9 % IJ SOLN
OROMUCOSAL | Status: DC | PRN
  Administered 2013-08-23: 09:00:00 via TOPICAL

## 2013-08-23 MED ORDER — PROPOFOL 10 MG/ML IV BOLUS
INTRAVENOUS | Status: DC | PRN
  Administered 2013-08-23: 40 mg via INTRAVENOUS

## 2013-08-23 MED ORDER — LACTATED RINGERS IV SOLN
INTRAVENOUS | Status: DC | PRN
  Administered 2013-08-23 (×2): via INTRAVENOUS

## 2013-08-23 MED ORDER — LACTATED RINGERS IV SOLN: INTRAVENOUS | Status: DC

## 2013-08-23 MED ORDER — SODIUM CHLORIDE 0.9 % IV SOLN
INTRAVENOUS | Status: DC
  Administered 2013-08-23: 3.5 [IU]/h via INTRAVENOUS
  Administered 2013-08-24: 10.5 [IU]/h via INTRAVENOUS
  Administered 2013-08-25: 5.5 [IU]/h via INTRAVENOUS
  Filled 2013-08-23 (×4): qty 1

## 2013-08-23 MED ORDER — ACETAMINOPHEN 650 MG RE SUPP
650.0000 mg | Freq: Once | RECTAL | Status: AC
  Administered 2013-08-23: 650 mg via RECTAL

## 2013-08-23 MED ORDER — DEXTROSE 5 % IV SOLN
1.5000 g | Freq: Two times a day (BID) | INTRAVENOUS | Status: AC
  Administered 2013-08-23 – 2013-08-25 (×4): 1.5 g via INTRAVENOUS
  Filled 2013-08-23 (×4): qty 1.5

## 2013-08-23 MED ORDER — ONDANSETRON HCL 4 MG/2ML IJ SOLN
4.0000 mg | Freq: Four times a day (QID) | INTRAMUSCULAR | Status: DC | PRN
Start: 2013-08-23 — End: 2013-08-30

## 2013-08-23 MED ORDER — BISACODYL 5 MG PO TBEC
10.0000 mg | DELAYED_RELEASE_TABLET | Freq: Every day | ORAL | Status: DC
  Administered 2013-08-24 – 2013-08-26 (×3): 10 mg via ORAL
  Filled 2013-08-23 (×4): qty 2

## 2013-08-23 MED ORDER — METOPROLOL TARTRATE 25 MG/10 ML ORAL SUSPENSION
12.5000 mg | Freq: Two times a day (BID) | ORAL | Status: DC
  Filled 2013-08-23 (×3): qty 5

## 2013-08-23 MED ORDER — 0.9 % SODIUM CHLORIDE (POUR BTL) OPTIME
TOPICAL | Status: DC | PRN
  Administered 2013-08-23: 6000 mL

## 2013-08-23 MED ORDER — SODIUM CHLORIDE 0.9 % IV SOLN
250.0000 mL | INTRAVENOUS | Status: DC
  Administered 2013-08-23: 20 mL/h via INTRAVENOUS

## 2013-08-23 MED ORDER — VECURONIUM BROMIDE 10 MG IV SOLR
INTRAVENOUS | Status: DC | PRN
  Administered 2013-08-23 (×3): 3 mg via INTRAVENOUS
  Administered 2013-08-23: 10 mg via INTRAVENOUS
  Administered 2013-08-23 (×2): 2 mg via INTRAVENOUS

## 2013-08-23 MED ORDER — ALBUMIN HUMAN 5 % IV SOLN
INTRAVENOUS | Status: DC | PRN
  Administered 2013-08-23: 13:00:00 via INTRAVENOUS

## 2013-08-23 MED ORDER — NITROGLYCERIN IN D5W 200-5 MCG/ML-% IV SOLN
0.0000 ug/min | INTRAVENOUS | Status: DC
  Administered 2013-08-24: 25 ug/min via INTRAVENOUS
  Filled 2013-08-23: qty 250

## 2013-08-23 MED ORDER — SODIUM CHLORIDE 0.9 % IJ SOLN: 3.0000 mL | INTRAMUSCULAR | Status: DC | PRN

## 2013-08-23 MED ORDER — LACTATED RINGERS IV SOLN: 500.0000 mL | Freq: Once | INTRAVENOUS | Status: AC | PRN

## 2013-08-23 MED ORDER — SODIUM CHLORIDE 0.9 % IV SOLN
INTRAVENOUS | Status: DC
  Administered 2013-08-23: 20 mL/h via INTRAVENOUS

## 2013-08-23 MED ORDER — INSULIN REGULAR BOLUS VIA INFUSION
0.0000 [IU] | Freq: Three times a day (TID) | INTRAVENOUS | Status: DC
  Administered 2013-08-24: 5 [IU] via INTRAVENOUS
  Administered 2013-08-24: 6 [IU] via INTRAVENOUS
  Administered 2013-08-24: 4 [IU] via INTRAVENOUS
  Administered 2013-08-25: 3 [IU] via INTRAVENOUS
  Administered 2013-08-25: 4 [IU] via INTRAVENOUS
  Administered 2013-08-25: 2 [IU] via INTRAVENOUS
  Filled 2013-08-23: qty 10

## 2013-08-23 MED ORDER — MIDAZOLAM HCL 5 MG/5ML IJ SOLN
INTRAMUSCULAR | Status: DC | PRN
  Administered 2013-08-23: 1 mg via INTRAVENOUS
  Administered 2013-08-23: 3 mg via INTRAVENOUS
  Administered 2013-08-23: 2 mg via INTRAVENOUS
  Administered 2013-08-23: 1 mg via INTRAVENOUS
  Administered 2013-08-23: 3 mg via INTRAVENOUS
  Administered 2013-08-23: 1 mg via INTRAVENOUS

## 2013-08-23 MED ORDER — POTASSIUM CHLORIDE 10 MEQ/50ML IV SOLN
10.0000 meq | INTRAVENOUS | Status: AC
  Administered 2013-08-23 (×3): 10 meq via INTRAVENOUS

## 2013-08-23 MED ORDER — PHENYLEPHRINE HCL 10 MG/ML IJ SOLN
0.0000 ug/min | INTRAVENOUS | Status: DC
  Filled 2013-08-23: qty 2

## 2013-08-23 MED ORDER — METOPROLOL TARTRATE 1 MG/ML IV SOLN: 2.5000 mg | INTRAVENOUS | Status: DC | PRN

## 2013-08-23 MED ORDER — VANCOMYCIN HCL IN DEXTROSE 1-5 GM/200ML-% IV SOLN
1000.0000 mg | Freq: Once | INTRAVENOUS | Status: AC
  Administered 2013-08-23: 1000 mg via INTRAVENOUS
  Filled 2013-08-23: qty 200

## 2013-08-23 MED ORDER — MORPHINE SULFATE 2 MG/ML IJ SOLN
2.0000 mg | INTRAMUSCULAR | Status: DC | PRN
  Administered 2013-08-23: 4 mg via INTRAVENOUS
  Administered 2013-08-23 – 2013-08-24 (×3): 2 mg via INTRAVENOUS
  Filled 2013-08-23: qty 1
  Filled 2013-08-23: qty 2
  Filled 2013-08-23 (×2): qty 1

## 2013-08-23 MED ORDER — MIDAZOLAM HCL 2 MG/2ML IJ SOLN
2.0000 mg | INTRAMUSCULAR | Status: DC | PRN
  Filled 2013-08-23: qty 2

## 2013-08-23 MED ORDER — SODIUM CHLORIDE 0.9 % IJ SOLN
3.0000 mL | Freq: Two times a day (BID) | INTRAMUSCULAR | Status: DC
  Administered 2013-08-24 – 2013-08-25 (×3): 3 mL via INTRAVENOUS

## 2013-08-23 MED ORDER — HEMOSTATIC AGENTS (NO CHARGE) OPTIME
TOPICAL | Status: DC | PRN
  Administered 2013-08-23: 1 via TOPICAL

## 2013-08-23 MED ORDER — DEXMEDETOMIDINE HCL IN NACL 200 MCG/50ML IV SOLN
0.1000 ug/kg/h | INTRAVENOUS | Status: DC
Start: 2013-08-23 — End: 2013-08-24
  Filled 2013-08-23: qty 50

## 2013-08-23 SURGICAL SUPPLY — 76 items
ATTRACTOMAT 16X20 MAGNETIC DRP (DRAPES) ×4 IMPLANT
BAG DECANTER FOR FLEXI CONT (MISCELLANEOUS) ×4 IMPLANT
BANDAGE ELASTIC 4 VELCRO ST LF (GAUZE/BANDAGES/DRESSINGS) ×4 IMPLANT
BANDAGE ELASTIC 6 VELCRO ST LF (GAUZE/BANDAGES/DRESSINGS) ×4 IMPLANT
BANDAGE GAUZE ELAST BULKY 4 IN (GAUZE/BANDAGES/DRESSINGS) ×4 IMPLANT
BLADE STERNUM SYSTEM 6 (BLADE) ×4 IMPLANT
BLADE SURG 11 STRL SS (BLADE) ×4 IMPLANT
CANISTER SUCTION 2500CC (MISCELLANEOUS) ×4 IMPLANT
CANN PRFSN .5XCNCT 15X34-48 (MISCELLANEOUS) ×3
CANNULA AORTIC HI-FLOW 6.5M20F (CANNULA) ×4 IMPLANT
CANNULA PRFSN .5XCNCT 15X34-48 (MISCELLANEOUS) ×3 IMPLANT
CANNULA VEN 2 STAGE (MISCELLANEOUS) ×1
CARDIAC SUCTION (MISCELLANEOUS) ×4 IMPLANT
CATH CPB KIT GERHARDT (MISCELLANEOUS) ×4 IMPLANT
CATH THORACIC 28FR (CATHETERS) ×4 IMPLANT
CLIP FOGARTY SPRING 6M (CLIP) ×4 IMPLANT
COVER SURGICAL LIGHT HANDLE (MISCELLANEOUS) ×4 IMPLANT
CRADLE DONUT ADULT HEAD (MISCELLANEOUS) ×4 IMPLANT
DRAIN CHANNEL 28F RND 3/8 FF (WOUND CARE) ×4 IMPLANT
DRAPE CARDIOVASCULAR INCISE (DRAPES) ×2
DRAPE SLUSH/WARMER DISC (DRAPES) ×4 IMPLANT
DRAPE SRG 135X102X78XABS (DRAPES) ×3 IMPLANT
DRSG AQUACEL AG ADV 3.5X14 (GAUZE/BANDAGES/DRESSINGS) ×4 IMPLANT
ELECT BLADE 4.0 EZ CLEAN MEGAD (MISCELLANEOUS) ×4
ELECT REM PT RETURN 9FT ADLT (ELECTROSURGICAL) ×8
ELECTRODE BLDE 4.0 EZ CLN MEGD (MISCELLANEOUS) ×3 IMPLANT
ELECTRODE REM PT RTRN 9FT ADLT (ELECTROSURGICAL) ×6 IMPLANT
GLOVE BIO SURGEON STRL SZ 6 (GLOVE) ×20 IMPLANT
GLOVE BIO SURGEON STRL SZ 6.5 (GLOVE) ×20 IMPLANT
GLOVE BIO SURGEON STRL SZ7 (GLOVE) ×8 IMPLANT
GLOVE BIO SURGEON STRL SZ8 (GLOVE) ×4 IMPLANT
GLOVE BIOGEL PI IND STRL 6.5 (GLOVE) ×12 IMPLANT
GLOVE BIOGEL PI INDICATOR 6.5 (GLOVE) ×4
GOWN STRL NON-REIN LRG LVL3 (GOWN DISPOSABLE) ×32 IMPLANT
HEMOSTAT POWDER SURGIFOAM 1G (HEMOSTASIS) ×12 IMPLANT
HEMOSTAT SURGICEL 2X14 (HEMOSTASIS) ×4 IMPLANT
KIT BASIN OR (CUSTOM PROCEDURE TRAY) ×4 IMPLANT
KIT ROOM TURNOVER OR (KITS) ×4 IMPLANT
KIT SUCTION CATH 14FR (SUCTIONS) ×8 IMPLANT
KIT VASOVIEW W/TROCAR VH 2000 (KITS) ×4 IMPLANT
LEAD PACING MYOCARDI (MISCELLANEOUS) ×4 IMPLANT
MARKER GRAFT CORONARY BYPASS (MISCELLANEOUS) ×16 IMPLANT
NS IRRIG 1000ML POUR BTL (IV SOLUTION) ×24 IMPLANT
PACK OPEN HEART (CUSTOM PROCEDURE TRAY) ×4 IMPLANT
PAD ARMBOARD 7.5X6 YLW CONV (MISCELLANEOUS) ×8 IMPLANT
PAD ELECT DEFIB RADIOL ZOLL (MISCELLANEOUS) ×4 IMPLANT
PENCIL BUTTON HOLSTER BLD 10FT (ELECTRODE) ×8 IMPLANT
PUNCH AORTIC ROTATE  4.5MM 8IN (MISCELLANEOUS) ×4 IMPLANT
SET CARDIOPLEGIA MPS 5001102 (MISCELLANEOUS) ×4 IMPLANT
SOLUTION ANTI FOG 6CC (MISCELLANEOUS) ×4 IMPLANT
SPONGE GAUZE 2X2 NONSTERILE (GAUZE/BANDAGES/DRESSINGS) ×4 IMPLANT
SPONGE GAUZE 4X4 12PLY (GAUZE/BANDAGES/DRESSINGS) ×8 IMPLANT
SPONGE GAUZE 4X4 12PLY STER LF (GAUZE/BANDAGES/DRESSINGS) ×12 IMPLANT
SPONGE LAP 18X18 X RAY DECT (DISPOSABLE) ×8 IMPLANT
SUT BONE WAX W31G (SUTURE) ×4 IMPLANT
SUT MNCRL AB 4-0 PS2 18 (SUTURE) ×4 IMPLANT
SUT PROLENE 3 0 SH1 36 (SUTURE) ×12 IMPLANT
SUT PROLENE 4 0 TF (SUTURE) ×8 IMPLANT
SUT PROLENE 6 0 C 1 30 (SUTURE) ×8 IMPLANT
SUT PROLENE 6 0 CC (SUTURE) ×12 IMPLANT
SUT PROLENE 7 0 BV1 MDA (SUTURE) ×8 IMPLANT
SUT PROLENE 8 0 BV175 6 (SUTURE) ×16 IMPLANT
SUT STEEL 6MS V (SUTURE) ×4 IMPLANT
SUT STEEL SZ 6 DBL 3X14 BALL (SUTURE) ×4 IMPLANT
SUT VIC AB 1 CTX 18 (SUTURE) ×8 IMPLANT
SUT VIC AB 2-0 CT1 36 (SUTURE) ×4 IMPLANT
SUTURE E-PAK OPEN HEART (SUTURE) ×4 IMPLANT
SYSTEM SAHARA CHEST DRAIN ATS (WOUND CARE) ×4 IMPLANT
TAPE CLOTH SURG 4X10 WHT LF (GAUZE/BANDAGES/DRESSINGS) ×8 IMPLANT
TOWEL OR 17X24 6PK STRL BLUE (TOWEL DISPOSABLE) ×8 IMPLANT
TOWEL OR 17X26 10 PK STRL BLUE (TOWEL DISPOSABLE) ×8 IMPLANT
TRAY FOLEY IC TEMP SENS 14FR (CATHETERS) ×4 IMPLANT
TUBE FEEDING 8FR 16IN STR KANG (MISCELLANEOUS) ×8 IMPLANT
TUBING INSUFFLATION 10FT LAP (TUBING) ×4 IMPLANT
UNDERPAD 30X30 INCONTINENT (UNDERPADS AND DIAPERS) ×4 IMPLANT
WATER STERILE IRR 1000ML POUR (IV SOLUTION) ×8 IMPLANT

## 2013-08-23 NOTE — Addendum Note (Signed)
Addendum created 08/23/13 1711 by Moshe Salisbury, CRNA   Modules edited: Anesthesia Events, Anesthesia Flowsheet

## 2013-08-23 NOTE — Anesthesia Procedure Notes (Signed)
Procedure Name: Intubation Date/Time: 08/23/2013 7:38 AM Performed by: Melina Copa, Maleea Camilo R Pre-anesthesia Checklist: Patient identified Patient Re-evaluated:Patient Re-evaluated prior to inductionOxygen Delivery Method: Circle system utilized Preoxygenation: Pre-oxygenation with 100% oxygen Intubation Type: IV induction Ventilation: Mask ventilation without difficulty Laryngoscope Size: Mac and 4 Grade View: Grade I Tube type: Oral Tube size: 8.5 mm Number of attempts: 1 Airway Equipment and Method: Stylet Placement Confirmation: ETT inserted through vocal cords under direct vision,  positive ETCO2 and breath sounds checked- equal and bilateral Secured at: 22 cm Tube secured with: Tape Dental Injury: Teeth and Oropharynx as per pre-operative assessment

## 2013-08-23 NOTE — Progress Notes (Signed)
TCTS BRIEF SICU PROGRESS NOTE  Day of Surgery  S/P Procedure(s) (LRB): CORONARY ARTERY BYPASS GRAFTING (CABG) (N/A) INTRAOPERATIVE TRANSESOPHAGEAL ECHOCARDIOGRAM (N/A) ENDOVEIN HARVEST OF GREATER SAPHENOUS VEIN (Right)   Extubated uneventfully AAI paced w/ stable hemodynamics Chest tube output low UOP adequate Labs okay  Plan: Continue routine early postop  [name] [name] H 08/23/2013 8:08 PM

## 2013-08-23 NOTE — Brief Op Note (Addendum)
      [address]       [place] [address]             [phone]        1:35 PM  PATIENT:  [name] [name]  73 y.o. male  PRE-OPERATIVE DIAGNOSIS:  CAD  POST-OPERATIVE DIAGNOSIS:  CAD  PROCEDURE:  Procedure(s) with comments:  CORONARY ARTERY BYPASS GRAFTING x 6 -LIMA to LAD -SEQUENTIAL SVG TO DIAGONAL AND RAMUS INTERMEDIATE -SEQUENTIAL SVG TO DISTAL LEFT CIRCUMFLEX BRANCH 1 and 2 -SVG to DISTAL RCA  ENDOSCOPIC SAPHENOUS VEIN HARVEST RIGHT LEG  INTRAOPERATIVE TRANSESOPHAGEAL ECHOCARDIOGRAM (N/A)  SURGEON:  Surgeon(s) and Role:    * Grace Isaac, MD - Primary  PHYSICIAN ASSISTANT: Erin Barrett PA-C  ANESTHESIA:   general  EBL:  Total I/O In: 2929 [I.V.:2000; Blood:679; IV Piggyback:250] Out: 1200 [Urine:1200]  BLOOD ADMINISTERED:  CELLSAVER  DRAINS: Left Pleural Chest Tube, Mediastinal Chest Tubes   LOCAL MEDICATIONS USED:  NONE  SPECIMEN:  No Specimen  DISPOSITION OF SPECIMEN:  N/A  COUNTS:  YES   DICTATION: .Dragon Dictation  PLAN OF CARE: Admit to inpatient   PATIENT DISPOSITION:  ICU - intubated and hemodynamically stable.   Delay start of Pharmacological VTE agent (>24hrs) due to surgical blood loss or risk of bleeding: yes

## 2013-08-23 NOTE — Preoperative (Signed)
Beta Blockers   Reason not to administer Beta Blockers:metoprolol given at 0549 hrs on 08/23/2014

## 2013-08-23 NOTE — Procedures (Signed)
Extubation Procedure Note  Patient Details:   Name: [name] [name] DOB: May 23, 1941 MRN: [id number]   Airway Documentation:     Evaluation  O2 sats: stable throughout Complications: No apparent complications Patient did tolerate procedure well. Bilateral Breath Sounds: Clear;Diminished Suctioning: Airway Yes  Pt. Tolerated SICU rapid wean, achieved 700 ml VC, -20 NIF, and was positive for a cuff leak. Pt did IS x 6 achieving 1081ml x 2. No stridor or dyspnea noted after extubation. All vitals are WNL. RT will continue to monitor.  Mariam Dollar 08/23/2013, 7:13 PM

## 2013-08-23 NOTE — Anesthesia Preprocedure Evaluation (Addendum)
Anesthesia Evaluation  Patient identified by MRN, date of birth, ID band Patient awake    Reviewed: Allergy & Precautions, H&P , NPO status , Patient's Chart, lab work & pertinent test results, reviewed documented beta blocker date and time   History of Anesthesia Complications Negative for: history of anesthetic complications  Airway Mallampati: II TM Distance: >3 FB Neck ROM: Full    Dental  (+) Edentulous Upper   Pulmonary neg pulmonary ROS,    Pulmonary exam normal       Cardiovascular METS: 3 - Mets hypertension, Pt. on medications and Pt. on home beta blockers + angina with exertion + CAD - Valvular Problems/MurmursRhythm:Regular Rate:Normal     Neuro/Psych Peripheral neuropathy negative neurological ROS  negative psych ROS   GI/Hepatic Neg liver ROS, GERD-  Medicated and Controlled,  Endo/Other  diabetes, Well Controlled, Type 2, Oral Hypoglycemic Agents  Renal/GU Renal disease  negative genitourinary   Musculoskeletal  (+) Arthritis -, Osteoarthritis,    Abdominal   Peds  Hematology negative hematology ROS (+)   Anesthesia Other Findings   Reproductive/Obstetrics                          Anesthesia Physical Anesthesia Plan  ASA: IV  Anesthesia Plan: General   Post-op Pain Management:    Induction: Intravenous  Airway Management Planned: Oral ETT  Additional Equipment: Arterial line, CVP, PA Cath, TEE and Ultrasound Guidance Line Placement  Intra-op Plan:   Post-operative Plan: Post-operative intubation/ventilation  Informed Consent: I have reviewed the patients History and Physical, chart, labs and discussed the procedure including the risks, benefits and alternatives for the proposed anesthesia with the patient or authorized representative who has indicated his/her understanding and acceptance.   Dental advisory given  Plan Discussed with: CRNA, Anesthesiologist and  Surgeon  Anesthesia Plan Comments:        Anesthesia Quick Evaluation

## 2013-08-23 NOTE — Transfer of Care (Signed)
Immediate Anesthesia Transfer of Care Note  Patient: [name] [name]  Procedure(s) Performed: Procedure(s) with comments: CORONARY ARTERY BYPASS GRAFTING (CABG) (N/A) - CABG times six utilizing the left internal mammary artery and the right greater saphenous vein harvested endoscopically INTRAOPERATIVE TRANSESOPHAGEAL ECHOCARDIOGRAM (N/A) ENDOVEIN HARVEST OF GREATER SAPHENOUS VEIN (Right)  Patient Location: ICU  Anesthesia Type:General  Level of Consciousness: unresponsive and Patient remains intubated per anesthesia plan  Airway & Oxygen Therapy: Patient remains intubated per anesthesia plan and Patient placed on Ventilator (see vital sign flow sheet for setting)  Post-op Assessment: Report given to PACU RN and Post -op Vital signs reviewed and stable  Post vital signs: Reviewed and stable  Complications: No apparent anesthesia complications

## 2013-08-23 NOTE — Anesthesia Postprocedure Evaluation (Signed)
  Anesthesia Post-op Note  Patient: [name] [name]  Procedure(s) Performed: Procedure(s) with comments: CORONARY ARTERY BYPASS GRAFTING (CABG) (N/A) - CABG times six utilizing the left internal mammary artery and the right greater saphenous vein harvested endoscopically INTRAOPERATIVE TRANSESOPHAGEAL ECHOCARDIOGRAM (N/A) ENDOVEIN HARVEST OF GREATER SAPHENOUS VEIN (Right)  Patient Location: ICU  Anesthesia Type:General  Level of Consciousness: sedated  Airway and Oxygen Therapy: Patient remains intubated per anesthesia plan  Post-op Pain: none  Post-op Assessment: Post-op Vital signs reviewed, Patient's Cardiovascular Status Stable, Respiratory Function Stable, No signs of Nausea or vomiting and Pain level controlled  Post-op Vital Signs: Reviewed and stable  Complications: No apparent anesthesia complications

## 2013-08-23 NOTE — Progress Notes (Signed)
  Echocardiogram Echocardiogram Transesophageal has been performed.  Mauricio Po 08/23/2013, 9:03 AM

## 2013-08-24 ENCOUNTER — Inpatient Hospital Stay (HOSPITAL_COMMUNITY): Payer: Medicare Other

## 2013-08-24 LAB — CBC
HCT: 32.5 % — ABNORMAL LOW (ref 39.0–52.0)
HEMATOCRIT: 32.9 % — AB (ref 39.0–52.0)
Hemoglobin: 11.3 g/dL — ABNORMAL LOW (ref 13.0–17.0)
Hemoglobin: 11.3 g/dL — ABNORMAL LOW (ref 13.0–17.0)
MCH: 31.9 pg (ref 26.0–34.0)
MCH: 31.9 pg (ref 26.0–34.0)
MCHC: 34.8 g/dL (ref 30.0–36.0)
MCHC: 34.3 g/dL (ref 30.0–36.0)
MCV: 91.8 fL (ref 78.0–100.0)
MCV: 92.9 fL (ref 78.0–100.0)
PLATELETS: 107 10*3/uL — AB (ref 150–400)
PLATELETS: 96 10*3/uL — AB (ref 150–400)
RBC: 3.54 MIL/uL — ABNORMAL LOW (ref 4.22–5.81)
RBC: 3.54 MIL/uL — ABNORMAL LOW (ref 4.22–5.81)
RDW: 13 % (ref 11.5–15.5)
RDW: 13.2 % (ref 11.5–15.5)
WBC: 11.8 10*3/uL — AB (ref 4.0–10.5)
WBC: 13.3 10*3/uL — ABNORMAL HIGH (ref 4.0–10.5)

## 2013-08-24 LAB — GLUCOSE, CAPILLARY
GLUCOSE-CAPILLARY: 112 mg/dL — AB (ref 70–99)
GLUCOSE-CAPILLARY: 114 mg/dL — AB (ref 70–99)
GLUCOSE-CAPILLARY: 114 mg/dL — AB (ref 70–99)
GLUCOSE-CAPILLARY: 117 mg/dL — AB (ref 70–99)
GLUCOSE-CAPILLARY: 120 mg/dL — AB (ref 70–99)
GLUCOSE-CAPILLARY: 126 mg/dL — AB (ref 70–99)
GLUCOSE-CAPILLARY: 144 mg/dL — AB (ref 70–99)
GLUCOSE-CAPILLARY: 152 mg/dL — AB (ref 70–99)
GLUCOSE-CAPILLARY: 167 mg/dL — AB (ref 70–99)
GLUCOSE-CAPILLARY: 169 mg/dL — AB (ref 70–99)
GLUCOSE-CAPILLARY: 218 mg/dL — AB (ref 70–99)
GLUCOSE-CAPILLARY: 98 mg/dL (ref 70–99)
Glucose-Capillary: 118 mg/dL — ABNORMAL HIGH (ref 70–99)
Glucose-Capillary: 115 mg/dL — ABNORMAL HIGH (ref 70–99)
Glucose-Capillary: 116 mg/dL — ABNORMAL HIGH (ref 70–99)
Glucose-Capillary: 103 mg/dL — ABNORMAL HIGH (ref 70–99)
Glucose-Capillary: 107 mg/dL — ABNORMAL HIGH (ref 70–99)
Glucose-Capillary: 110 mg/dL — ABNORMAL HIGH (ref 70–99)
Glucose-Capillary: 110 mg/dL — ABNORMAL HIGH (ref 70–99)
Glucose-Capillary: 118 mg/dL — ABNORMAL HIGH (ref 70–99)
Glucose-Capillary: 119 mg/dL — ABNORMAL HIGH (ref 70–99)
Glucose-Capillary: 121 mg/dL — ABNORMAL HIGH (ref 70–99)
Glucose-Capillary: 123 mg/dL — ABNORMAL HIGH (ref 70–99)

## 2013-08-24 LAB — CREATININE, SERUM
Creatinine, Ser: 0.88 mg/dL (ref 0.50–1.35)
GFR calc Af Amer: >90 mL/min (ref >90)
GFR calc non Af Amer: 83 mL/min — ABNORMAL LOW (ref >90)

## 2013-08-24 LAB — POCT I-STAT, CHEM 8
BUN: 14 mg/dL (ref 6–23)
CALCIUM ION: 1.24 mmol/L (ref 1.13–1.30)
Chloride: 98 mEq/L (ref 96–112)
Creatinine, Ser: 0.9 mg/dL (ref 0.50–1.35)
Glucose, Bld: 200 mg/dL — ABNORMAL HIGH (ref 70–99)
HEMATOCRIT: 33 % — AB (ref 39.0–52.0)
Hemoglobin: 11.2 g/dL — ABNORMAL LOW (ref 13.0–17.0)
Potassium: 4.2 mEq/L (ref 3.7–5.3)
SODIUM: 137 meq/L (ref 137–147)
TCO2: 23 mmol/L (ref 0–100)

## 2013-08-24 LAB — BASIC METABOLIC PANEL
BUN: 15 mg/dL (ref 6–23)
CHLORIDE: 106 meq/L (ref 96–112)
CO2: 23 mEq/L (ref 19–32)
CREATININE: 0.86 mg/dL (ref 0.50–1.35)
Calcium: 8 mg/dL — ABNORMAL LOW (ref 8.4–10.5)
GFR calc non Af Amer: 84 mL/min — ABNORMAL LOW (ref >90)
Glucose, Bld: 119 mg/dL — ABNORMAL HIGH (ref 70–99)
Potassium: 4.2 mEq/L (ref 3.7–5.3)
SODIUM: 141 meq/L (ref 137–147)

## 2013-08-24 LAB — MAGNESIUM
MAGNESIUM: 2.7 mg/dL — AB (ref 1.5–2.5)
Magnesium: 2.4 mg/dL (ref 1.5–2.5)

## 2013-08-24 MED ORDER — ACYCLOVIR 200 MG PO CAPS
400.0000 mg | ORAL_CAPSULE | Freq: Two times a day (BID) | ORAL | Status: DC
  Administered 2013-08-24 – 2013-08-30 (×13): 400 mg via ORAL
  Filled 2013-08-24 (×14): qty 2

## 2013-08-24 MED ORDER — FUROSEMIDE 10 MG/ML IJ SOLN
20.0000 mg | Freq: Four times a day (QID) | INTRAMUSCULAR | Status: AC
  Administered 2013-08-24 (×3): 20 mg via INTRAVENOUS
  Filled 2013-08-24: qty 2

## 2013-08-24 MED ORDER — INSULIN ASPART 100 UNIT/ML ~~LOC~~ SOLN
0.0000 [IU] | SUBCUTANEOUS | Status: DC
Start: 2013-08-24 — End: 2013-08-29
  Administered 2013-08-24: 8 [IU] via SUBCUTANEOUS
  Administered 2013-08-24 – 2013-08-26 (×3): 4 [IU] via SUBCUTANEOUS
  Administered 2013-08-27 – 2013-08-28 (×6): 2 [IU] via SUBCUTANEOUS
  Administered 2013-08-28: 4 [IU] via SUBCUTANEOUS

## 2013-08-24 MED ORDER — MORPHINE SULFATE 2 MG/ML IJ SOLN
2.0000 mg | INTRAMUSCULAR | Status: DC | PRN
  Administered 2013-08-24 – 2013-08-25 (×3): 2 mg via INTRAVENOUS
  Filled 2013-08-24 (×3): qty 1

## 2013-08-24 MED ORDER — AMIODARONE HCL IN DEXTROSE 360-4.14 MG/200ML-% IV SOLN
30.0000 mg/h | INTRAVENOUS | Status: AC
  Administered 2013-08-25: 30 mg/h via INTRAVENOUS
  Filled 2013-08-24 (×3): qty 200

## 2013-08-24 MED ORDER — AMIODARONE HCL IN DEXTROSE 360-4.14 MG/200ML-% IV SOLN
INTRAVENOUS | Status: AC
  Filled 2013-08-24: qty 200

## 2013-08-24 MED ORDER — AMIODARONE HCL IN DEXTROSE 360-4.14 MG/200ML-% IV SOLN
60.0000 mg/h | INTRAVENOUS | Status: AC
  Administered 2013-08-24 (×2): 60 mg/h via INTRAVENOUS
  Filled 2013-08-24: qty 200

## 2013-08-24 MED ORDER — INSULIN DETEMIR 100 UNIT/ML ~~LOC~~ SOLN
20.0000 [IU] | Freq: Two times a day (BID) | SUBCUTANEOUS | Status: DC
  Administered 2013-08-24 – 2013-08-25 (×4): 20 [IU] via SUBCUTANEOUS
  Filled 2013-08-24 (×5): qty 0.2

## 2013-08-24 NOTE — Progress Notes (Signed)
Dr. Roxy Manns at bedside. Updated on pt status. Remains in afib-rate controlled. Will start amio gtt per orders. Also made aware pt's CBG at 1600 was 218 after weaning off insulin gtt per protocol. Will restart insulin gtt, continue to give scheduled levemir and follow glucostabilizer. Will continue to monitor closely. Fulton Reek, RN

## 2013-08-24 NOTE — Progress Notes (Signed)
Dr. Roxy Manns made aware pt now in rate controlled afib-previously AAI at 83. Current rate 70-low 90s. BP stable. Pt has no history of afib. Per MD-will continue to monitor for now since rate is controlled. Will continue to monitor closely. Fulton Reek, RN

## 2013-08-24 NOTE — Progress Notes (Signed)
TCTS BRIEF SICU PROGRESS NOTE  1 Day Post-Op  S/P Procedure(s) (LRB): CORONARY ARTERY BYPASS GRAFTING (CABG) (N/A) INTRAOPERATIVE TRANSESOPHAGEAL ECHOCARDIOGRAM (N/A) ENDOVEIN HARVEST OF GREATER SAPHENOUS VEIN (Right)   Looks and feels well, although now in rate controlled Afib HR 90 BP stable O2 sats 96% on 4 L/min Diuresing well  Plan: Will start amiodarone  [name] [name],[name] [name] 08/24/2013 6:11 PM

## 2013-08-24 NOTE — Progress Notes (Signed)
[address]       [place] [address]             [phone]        CARDIOTHORACIC SURGERY PROGRESS NOTE   R1 Day Post-Op Procedure(s) (LRB): CORONARY ARTERY BYPASS GRAFTING (CABG) (N/A) INTRAOPERATIVE TRANSESOPHAGEAL ECHOCARDIOGRAM (N/A) ENDOVEIN HARVEST OF GREATER SAPHENOUS VEIN (Right)  Subjective: Looks good and feels well.  Minimal soreness.  No nausea, SOB  Objective: Vital signs: BP Readings from Last 1 Encounters:  08/24/13 121/69   Pulse Readings from Last 1 Encounters:  08/24/13 79   Resp Readings from Last 1 Encounters:  08/24/13 21   Temp Readings from Last 1 Encounters:  08/24/13 100 F (37.8 C)     Hemodynamics: PAP: (25-52)/(8-29) 51/17 mmHg CO:  [3.9 L/min-7.2 L/min] 6.5 L/min CI:  [2 L/min/m2-3.8 L/min/m2] 3.4 L/min/m2  Physical Exam:  Rhythm:   Sinus brady, AAI paced  Breath sounds: clear  Heart sounds:  RRR  Incisions:  Dressing dry, intact  Abdomen:  Soft, non-distended, non-tender  Extremities:  Warm, well-perfused    Intake/Output from previous day: 01/16 0701 - 01/17 0700 In: 5212.7 [I.V.:3253.7; Blood:679; NG/GT:30; IV Piggyback:1250] Out: 8416 [Urine:3015; Emesis/NG output:25; Blood:2415; Chest Tube:505] Intake/Output this shift: Total I/O In: 694.1 [P.O.:500; I.V.:194.1] Out: 210 [Urine:155; Chest Tube:55]  Lab Results:  CBC: Recent Labs  08/23/13 1945 08/23/13 2013 08/24/13 0400  WBC 12.3*  --  11.8*  HGB 12.1* 10.9* 11.3*  HCT 34.4* 32.0* 32.5*  PLT 122*  --  107*    BMET:  Recent Labs  08/23/13 0500  08/23/13 2013 08/24/13 0400  NA 141  < > 140 141  K 3.9  < > 3.9 4.2  CL 103  --  108 106  CO2 23  --   --  23  GLUCOSE 143*  < > 114* 119*  BUN 17  --  13 15  CREATININE 0.93  < > 0.80 0.86  CALCIUM 9.2  --   --  8.0*  < > = values in this interval not displayed.   CBG (last 3)   Recent Labs  08/23/13 1459 08/23/13 1602 08/24/13 0759  GLUCAP 95 100* 118*    ABG      Component Value Date/Time   PHART 7.337* 08/23/2013 2008   PCO2ART 43.7 08/23/2013 2008   PO2ART 72.0* 08/23/2013 2008   HCO3 23.0 08/23/2013 2008   TCO2 23 08/23/2013 2013   ACIDBASEDEF 2.0 08/23/2013 2008   O2SAT 91.0 08/23/2013 2008    CXR: CLINICAL DATA: Postop.  EXAM:  PORTABLE CHEST - 1 VIEW  COMPARISON: 08/23/2013  FINDINGS:  Endotracheal tube and nasogastric tube have been removed. Swan-Ganz  catheter tip in the right lower lobar pulmonary artery and  unchanged. Mediastinal and left chest tube are stable in position.  No evidence for a large pneumothorax. Opacity at the left lung base  is most compatible with atelectasis. Heart size appears to be  enlarged but this may be accentuated by the technique and low lung  volumes.  IMPRESSION:  Stable position of the chest tubes without a large pneumothorax.  Low lung volumes with consolidation or atelectasis at the left lung  base.  Electronically Signed  By: Markus Daft M.D.  On: 08/24/2013 08:44   Assessment/Plan: S/P Procedure(s) (LRB): CORONARY ARTERY BYPASS GRAFTING (CABG) (N/A) INTRAOPERATIVE TRANSESOPHAGEAL ECHOCARDIOGRAM (N/A) ENDOVEIN HARVEST OF GREATER SAPHENOUS VEIN (Right)  Doing well POD1 Sinus brady w/ stable hemodynamics off all drips  Hypertension Expected post op acute blood loss anemia, mild, stable Expected post op volume excess, mild Expected post op atelectasis, mild Type II diabetes mellitus, excellent glycemic control on insulin drip    Hold beta blocker and AAI pace for now  Mobilize  D/C tubes and lines  Pulm toilet  Add levemir insulin and wean drip   Shayden Gingrich H 08/24/2013 11:05 AM

## 2013-08-24 NOTE — Progress Notes (Signed)
CRITICAL VALUE ALERT  Critical value received:  STEMI   Date of notification:  08/23/13  Time of notification:  1418  Critical value read back:yes  Nurse who received alert:  Fulton Reek, RN  MD notified (1st page):  Dr. Servando Snare   Time of first page:  1418-MD at bedside when 12-lead recorded  MD notified (2nd page):  Time of second page:  Responding MD:  Dr. Servando Snare   Time MD responded:  [phone]

## 2013-08-25 ENCOUNTER — Inpatient Hospital Stay (HOSPITAL_COMMUNITY): Payer: Medicare Other

## 2013-08-25 LAB — CBC
HCT: 32.2 % — ABNORMAL LOW (ref 39.0–52.0)
Hemoglobin: 11.2 g/dL — ABNORMAL LOW (ref 13.0–17.0)
MCH: 32.1 pg (ref 26.0–34.0)
MCHC: 34.8 g/dL (ref 30.0–36.0)
MCV: 92.3 fL (ref 78.0–100.0)
PLATELETS: 96 10*3/uL — AB (ref 150–400)
RBC: 3.49 MIL/uL — AB (ref 4.22–5.81)
RDW: 13.3 % (ref 11.5–15.5)
WBC: 14.5 10*3/uL — AB (ref 4.0–10.5)

## 2013-08-25 LAB — GLUCOSE, CAPILLARY
GLUCOSE-CAPILLARY: 118 mg/dL — AB (ref 70–99)
GLUCOSE-CAPILLARY: 98 mg/dL (ref 70–99)
GLUCOSE-CAPILLARY: 135 mg/dL — AB (ref 70–99)
GLUCOSE-CAPILLARY: 152 mg/dL — AB (ref 70–99)
GLUCOSE-CAPILLARY: 170 mg/dL — AB (ref 70–99)
GLUCOSE-CAPILLARY: 150 mg/dL — AB (ref 70–99)
Glucose-Capillary: 107 mg/dL — ABNORMAL HIGH (ref 70–99)
Glucose-Capillary: 112 mg/dL — ABNORMAL HIGH (ref 70–99)
Glucose-Capillary: 114 mg/dL — ABNORMAL HIGH (ref 70–99)
Glucose-Capillary: 120 mg/dL — ABNORMAL HIGH (ref 70–99)
Glucose-Capillary: 129 mg/dL — ABNORMAL HIGH (ref 70–99)
Glucose-Capillary: 140 mg/dL — ABNORMAL HIGH (ref 70–99)
Glucose-Capillary: 146 mg/dL — ABNORMAL HIGH (ref 70–99)
Glucose-Capillary: 147 mg/dL — ABNORMAL HIGH (ref 70–99)
Glucose-Capillary: 210 mg/dL — ABNORMAL HIGH (ref 70–99)
Glucose-Capillary: 84 mg/dL (ref 70–99)
Glucose-Capillary: 88 mg/dL (ref 70–99)
Glucose-Capillary: 90 mg/dL (ref 70–99)
Glucose-Capillary: 91 mg/dL (ref 70–99)
Glucose-Capillary: 95 mg/dL (ref 70–99)
Glucose-Capillary: 97 mg/dL (ref 70–99)
Glucose-Capillary: 98 mg/dL (ref 70–99)

## 2013-08-25 LAB — BASIC METABOLIC PANEL
BUN: 16 mg/dL (ref 6–23)
CALCIUM: 8.5 mg/dL (ref 8.4–10.5)
CO2: 26 meq/L (ref 19–32)
Chloride: 99 mEq/L (ref 96–112)
Creatinine, Ser: 0.9 mg/dL (ref 0.50–1.35)
GFR calc non Af Amer: 82 mL/min — ABNORMAL LOW (ref >90)
Glucose, Bld: 89 mg/dL (ref 70–99)
Potassium: 3.7 mEq/L (ref 3.7–5.3)
SODIUM: 138 meq/L (ref 137–147)

## 2013-08-25 MED ORDER — AMIODARONE HCL 200 MG PO TABS
200.0000 mg | ORAL_TABLET | Freq: Two times a day (BID) | ORAL | Status: DC
  Administered 2013-08-25 – 2013-08-30 (×10): 200 mg via ORAL
  Filled 2013-08-25 (×13): qty 1

## 2013-08-25 MED ORDER — METFORMIN HCL 500 MG PO TABS
500.0000 mg | ORAL_TABLET | Freq: Two times a day (BID) | ORAL | Status: DC
  Administered 2013-08-25 – 2013-08-27 (×4): 500 mg via ORAL
  Filled 2013-08-25 (×6): qty 1

## 2013-08-25 MED ORDER — MOVING RIGHT ALONG BOOK
Freq: Once | Status: AC
  Administered 2013-08-25: 16:00:00
  Filled 2013-08-25: qty 1

## 2013-08-25 MED ORDER — POTASSIUM CHLORIDE 10 MEQ/50ML IV SOLN
INTRAVENOUS | Status: AC
  Administered 2013-08-25: 10 meq
  Filled 2013-08-25: qty 50

## 2013-08-25 MED ORDER — TRAMADOL HCL 50 MG PO TABS
50.0000 mg | ORAL_TABLET | ORAL | Status: DC | PRN
  Administered 2013-08-27 (×2): 50 mg via ORAL
  Filled 2013-08-25 (×2): qty 1

## 2013-08-25 MED ORDER — FUROSEMIDE 40 MG PO TABS
40.0000 mg | ORAL_TABLET | Freq: Every day | ORAL | Status: DC
  Administered 2013-08-26 – 2013-08-30 (×5): 40 mg via ORAL
  Filled 2013-08-25 (×5): qty 1

## 2013-08-25 MED ORDER — SODIUM CHLORIDE 0.9 % IV SOLN: 250.0000 mL | INTRAVENOUS | Status: DC | PRN

## 2013-08-25 MED ORDER — GABAPENTIN 600 MG PO TABS
600.0000 mg | ORAL_TABLET | Freq: Two times a day (BID) | ORAL | Status: DC
  Administered 2013-08-25 – 2013-08-30 (×11): 600 mg via ORAL
  Filled 2013-08-25 (×12): qty 1

## 2013-08-25 MED ORDER — METOPROLOL TARTRATE 12.5 MG HALF TABLET
12.5000 mg | ORAL_TABLET | Freq: Two times a day (BID) | ORAL | Status: DC
  Administered 2013-08-25: 12.5 mg via ORAL
  Filled 2013-08-25 (×4): qty 1

## 2013-08-25 MED ORDER — POTASSIUM CHLORIDE 10 MEQ/50ML IV SOLN
10.0000 meq | INTRAVENOUS | Status: DC | PRN
  Administered 2013-08-25 (×2): 10 meq via INTRAVENOUS
  Filled 2013-08-25: qty 50

## 2013-08-25 MED ORDER — LOSARTAN POTASSIUM 25 MG PO TABS
25.0000 mg | ORAL_TABLET | Freq: Every day | ORAL | Status: DC
  Administered 2013-08-25 – 2013-08-27 (×3): 25 mg via ORAL
  Filled 2013-08-25 (×5): qty 1

## 2013-08-25 MED ORDER — SODIUM CHLORIDE 0.9 % IJ SOLN
3.0000 mL | Freq: Two times a day (BID) | INTRAMUSCULAR | Status: DC
Start: 2013-08-25 — End: 2013-08-30
  Administered 2013-08-25 – 2013-08-30 (×7): 3 mL via INTRAVENOUS

## 2013-08-25 MED ORDER — SODIUM CHLORIDE 0.9 % IJ SOLN: 3.0000 mL | INTRAMUSCULAR | Status: DC | PRN

## 2013-08-25 MED ORDER — POTASSIUM CHLORIDE CRYS ER 20 MEQ PO TBCR
20.0000 meq | EXTENDED_RELEASE_TABLET | Freq: Every day | ORAL | Status: DC
Start: 2013-08-26 — End: 2013-08-26
  Filled 2013-08-25: qty 1

## 2013-08-25 NOTE — Progress Notes (Addendum)
[address]       [place] [address]             [phone]        CARDIOTHORACIC SURGERY PROGRESS NOTE   R2 Days Post-Op Procedure(s) (LRB): CORONARY ARTERY BYPASS GRAFTING (CABG) (N/A) INTRAOPERATIVE TRANSESOPHAGEAL ECHOCARDIOGRAM (N/A) ENDOVEIN HARVEST OF GREATER SAPHENOUS VEIN (Right)  Subjective: Looks good.  Very mild soreness in chest.  Denies nausea or SOB.  Objective: Vital signs: BP Readings from Last 1 Encounters:  08/25/13 116/48   Pulse Readings from Last 1 Encounters:  08/25/13 72   Resp Readings from Last 1 Encounters:  08/25/13 26   Temp Readings from Last 1 Encounters:  08/25/13 98.3 F (36.8 C) Oral    Hemodynamics:    Physical Exam:  Rhythm:   Converted back to NSR this am  Breath sounds: clear  Heart sounds:  RRR  Incisions:  Dressing dry and intact  Abdomen:  Soft, non-distended, non-tender  Extremities:  Warm, well-perfused   Intake/Output from previous day: 01/17 0701 - 01/18 0700 In: 1739.1 [P.O.:1000; I.V.:639.1; IV Piggyback:100] Out: 2630 [Urine:2525; Chest Tube:105] Intake/Output this shift: Total I/O In: 481.2 [P.O.:320; I.V.:111.2; IV Piggyback:50] Out: 470 [Urine:470]  Lab Results:  CBC: Recent Labs  08/24/13 1655 08/25/13 0400  WBC 13.3* 14.5*  HGB 11.3* 11.2*  HCT 32.9* 32.2*  PLT 96* 96*    BMET:  Recent Labs  08/24/13 0400 08/24/13 1650 08/24/13 1655 08/25/13 0400  NA 141 137  --  138  K 4.2 4.2  --  3.7  CL 106 98  --  99  CO2 23  --   --  26  GLUCOSE 119* 200*  --  89  BUN 15 14  --  16  CREATININE 0.86 0.90 0.88 0.90  CALCIUM 8.0*  --   --  8.5     CBG (last 3)   Recent Labs  08/25/13 1002 08/25/13 1115 08/25/13 1155  GLUCAP 152* 129* 140*    ABG    Component Value Date/Time   PHART 7.337* 08/23/2013 2008   PCO2ART 43.7 08/23/2013 2008   PO2ART 72.0* 08/23/2013 2008   HCO3 23.0 08/23/2013 2008   TCO2 23 08/24/2013 1650   ACIDBASEDEF 2.0 08/23/2013 2008   O2SAT 91.0 08/23/2013 2008    CXR: CLINICAL DATA: Post cardiac surgery.  EXAM:  PORTABLE CHEST - 1 VIEW  COMPARISON: 08/24/2013  FINDINGS:  The chest drains were removed. No evidence for a large pneumothorax.  Swan-Ganz catheter removed. Right jugular central line is still  present. Left basilar atelectasis with slightly improved aeration at  the left lung base. Hazy densities at the right lung base are  suggestive for atelectasis. Median sternotomy wires are present. The  trachea is midline. Heart size is stable.  IMPRESSION:  Removal of chest drains without a pneumothorax.  Bibasilar atelectasis.  Electronically Signed  By: Markus Daft M.D.  On: 08/25/2013 08:57    Assessment/Plan: S/P Procedure(s) (LRB): CORONARY ARTERY BYPASS GRAFTING (CABG) (N/A) INTRAOPERATIVE TRANSESOPHAGEAL ECHOCARDIOGRAM (N/A) ENDOVEIN HARVEST OF GREATER SAPHENOUS VEIN (Right)  Doing well POD2 Post-op Afib, now back in NSR on IV amiodarone Hypertension under good control Expected post op acute blood loss anemia, mild, stable  Expected post op volume excess, mild  Expected post op atelectasis, mild  Type II diabetes mellitus, excellent glycemic control but still on insulin drip   Convert amiodarone to oral Restart low dose metoprolol Mobilize   Pulm toilet  Continue  levemir insulin and wean drip Restart metformin Restart Cozaar at lower dose Hold statin for now due to reported "allergy"  Transfer floor   OWEN,CLARENCE H 08/25/2013 1:50 PM

## 2013-08-26 ENCOUNTER — Inpatient Hospital Stay (HOSPITAL_COMMUNITY): Payer: Medicare Other

## 2013-08-26 LAB — BASIC METABOLIC PANEL
BUN: 17 mg/dL (ref 6–23)
CALCIUM: 9.1 mg/dL (ref 8.4–10.5)
CO2: 28 mEq/L (ref 19–32)
Chloride: 104 mEq/L (ref 96–112)
Creatinine, Ser: 0.91 mg/dL (ref 0.50–1.35)
GFR calc Af Amer: >90 mL/min (ref >90)
GFR, EST NON AFRICAN AMERICAN: 82 mL/min — AB (ref >90)
GLUCOSE: 92 mg/dL (ref 70–99)
POTASSIUM: 5.2 meq/L (ref 3.7–5.3)
SODIUM: 143 meq/L (ref 137–147)

## 2013-08-26 LAB — GLUCOSE, CAPILLARY
GLUCOSE-CAPILLARY: 93 mg/dL (ref 70–99)
GLUCOSE-CAPILLARY: 126 mg/dL — AB (ref 70–99)
GLUCOSE-CAPILLARY: 76 mg/dL (ref 70–99)
GLUCOSE-CAPILLARY: 96 mg/dL (ref 70–99)
GLUCOSE-CAPILLARY: 85 mg/dL (ref 70–99)
GLUCOSE-CAPILLARY: 184 mg/dL — AB (ref 70–99)
Glucose-Capillary: 105 mg/dL — ABNORMAL HIGH (ref 70–99)
Glucose-Capillary: 105 mg/dL — ABNORMAL HIGH (ref 70–99)
Glucose-Capillary: 118 mg/dL — ABNORMAL HIGH (ref 70–99)
Glucose-Capillary: 82 mg/dL (ref 70–99)
Glucose-Capillary: 89 mg/dL (ref 70–99)
Glucose-Capillary: 172 mg/dL — ABNORMAL HIGH (ref 70–99)

## 2013-08-26 LAB — CBC
HCT: 32.4 % — ABNORMAL LOW (ref 39.0–52.0)
HEMOGLOBIN: 11.2 g/dL — AB (ref 13.0–17.0)
MCH: 31.9 pg (ref 26.0–34.0)
MCHC: 34.6 g/dL (ref 30.0–36.0)
MCV: 92.3 fL (ref 78.0–100.0)
Platelets: 112 10*3/uL — ABNORMAL LOW (ref 150–400)
RBC: 3.51 MIL/uL — ABNORMAL LOW (ref 4.22–5.81)
RDW: 13.3 % (ref 11.5–15.5)
WBC: 13.1 10*3/uL — ABNORMAL HIGH (ref 4.0–10.5)

## 2013-08-26 MED ORDER — INSULIN DETEMIR 100 UNIT/ML ~~LOC~~ SOLN
16.0000 [IU] | Freq: Two times a day (BID) | SUBCUTANEOUS | Status: DC
  Filled 2013-08-26: qty 0.16

## 2013-08-26 MED ORDER — PSYLLIUM 95 % PO PACK
1.0000 | PACK | Freq: Every day | ORAL | Status: DC
  Administered 2013-08-26 – 2013-08-27 (×2): 1 via ORAL
  Filled 2013-08-26 (×6): qty 1

## 2013-08-26 MED ORDER — AMIODARONE IV BOLUS ONLY 150 MG/100ML
150.0000 mg | Freq: Once | INTRAVENOUS | Status: AC
  Administered 2013-08-26: 150 mg via INTRAVENOUS
  Filled 2013-08-26: qty 100

## 2013-08-26 MED ORDER — FENOFIBRATE 160 MG PO TABS
160.0000 mg | ORAL_TABLET | Freq: Every day | ORAL | Status: DC
  Administered 2013-08-26 – 2013-08-29 (×4): 160 mg via ORAL
  Filled 2013-08-26 (×6): qty 1

## 2013-08-26 MED ORDER — METOPROLOL TARTRATE 25 MG PO TABS
25.0000 mg | ORAL_TABLET | Freq: Two times a day (BID) | ORAL | Status: DC
  Administered 2013-08-26 (×2): 25 mg via ORAL
  Filled 2013-08-26 (×4): qty 1

## 2013-08-26 MED ORDER — ENOXAPARIN SODIUM 30 MG/0.3ML ~~LOC~~ SOLN
30.0000 mg | Freq: Every day | SUBCUTANEOUS | Status: DC
  Administered 2013-08-26 – 2013-08-27 (×2): 30 mg via SUBCUTANEOUS
  Filled 2013-08-26 (×3): qty 0.3

## 2013-08-26 MED FILL — Potassium Chloride Inj 2 mEq/ML: INTRAVENOUS | Qty: 40 | Status: AC

## 2013-08-26 MED FILL — Electrolyte-R (PH 7.4) Solution: INTRAVENOUS | Qty: 3000 | Status: AC

## 2013-08-26 MED FILL — Sodium Chloride IV Soln 0.9%: INTRAVENOUS | Qty: 2000 | Status: AC

## 2013-08-26 MED FILL — Sodium Bicarbonate IV Soln 8.4%: INTRAVENOUS | Qty: 50 | Status: AC

## 2013-08-26 MED FILL — Lidocaine HCl IV Inj 20 MG/ML: INTRAVENOUS | Qty: 5 | Status: AC

## 2013-08-26 MED FILL — Mannitol IV Soln 20%: INTRAVENOUS | Qty: 500 | Status: AC

## 2013-08-26 MED FILL — Heparin Sodium (Porcine) Inj 1000 Unit/ML: INTRAMUSCULAR | Qty: 30 | Status: AC

## 2013-08-26 MED FILL — Magnesium Sulfate Inj 50%: INTRAMUSCULAR | Qty: 10 | Status: AC

## 2013-08-26 MED FILL — Heparin Sodium (Porcine) Inj 1000 Unit/ML: INTRAMUSCULAR | Qty: 10 | Status: AC

## 2013-08-26 NOTE — Progress Notes (Signed)
Amio bolus complete. HR currently at 87. Patient still in A-fib. Will continue to monitor rhythm and rate. [name] [name] R

## 2013-08-26 NOTE — Progress Notes (Signed)
Woke up patient to remove aqua seal dressing. Patient complained of being hot and feeling like he has a temperature. Patient red, and sweaty. Patient's temperature 101.9.  Patient given dose of Tylenol. Then noticed that patient was in atril-fib HR 106. Did EKG and results showed AFIB w/ RVR. Central monitoring reports that patient converted to A-FIB at 1447. Patient 02 sats 83%. Put patient on [place] 3L O2. Patient sating at 91%. No complaints of chest pain or SOB. [place], [place] notified. Orders given. Will continue to monitor. Elmarie Shiley R

## 2013-08-26 NOTE — Progress Notes (Signed)
Patient transferred from 2S to bed 2w25. Report received from [place], [place]. Patient stable, will continue to monitor.  Elmarie Shiley R

## 2013-08-26 NOTE — Progress Notes (Signed)
UR Completed.  [name] [name] Jane [phone] 08/26/2013  

## 2013-08-26 NOTE — Progress Notes (Signed)
CARDIAC REHAB PHASE I   PRE:  Rate/Rhythm: 102 afib    BP: sitting 133/73    SaO2: 83 RA up to 91 3L  Planned to walk pt however noted to have just gone into afib while asleep. Upon RN's assessment pt had temp of 103. SaO2 checked and 83 RA lying in bed. Applied 3L O2 and up to 90-91. Pt with some SOB/wheeze lying flat in bed. Will hold ambulation to allow for temp to go down. RN will walk later. Will f/u am. [phone]  Josephina Shih [place] CES, ACSM 08/26/2013 3:11 PM
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
## 2013-08-26 NOTE — Progress Notes (Signed)
Patient ambulated approx. 550 feet on 3L [place].  Tolerated walk well. [name] [name] R

## 2013-08-26 NOTE — Op Note (Signed)
NAMETHEODOROS, STJAMES NO.:  [id number]  MEDICAL RECORD NO.:  [id number]  LOCATION:  2S16C                        FACILITY:  [facility]  PHYSICIAN:  Lanelle Bal, MD    DATE OF BIRTH:  [birth year]  DATE OF PROCEDURE:  08/23/2013 DATE OF DISCHARGE:                              OPERATIVE REPORT   PREOPERATIVE DIAGNOSIS:  New-onset of angina with severe 3 vessel coronary artery disease.  POSTOPERATIVE DIAGNOSIS:  New-onset of angina with severe 3 vessel coronary artery disease.  SURGICAL PROCEDURE:  Coronary artery bypass grafting x6 with left internal mammary to the left anterior descending coronary artery, sequential reverse saphenous vein graft to the intermediate and diagonal coronary artery, sequential reverse saphenous vein graft to the obtuse marginal and distal circumflex, and reverse saphenous vein graft to the distal right coronary artery with right leg, thigh and calf endo vein harvesting.  SURGEON:  Lanelle Bal, MD  FIRST ASSISTANT:  Providence Crosby, PA  BRIEF HISTORY:  The patient is a 73 year old male who presented with new onset of angina and positive stress test.  Cardiac catheterization was done by Dr. Angelena Form, which demonstrated severe three-vessel coronary artery disease with preserved LV function.  The patient had an 80% LAD, 80-90% diagonal intermediate disease, 99% proximal circumflex, and 80% distal circumflex after the takeoff of obtuse marginal.  The right coronary artery was totally occluded.  The patient also had distal disease in the LAD.  Risks and options were discussed with the patient in detail and he was agreeable with proceeding.  DESCRIPTION OF PROCEDURE:  With Swan-Ganz and arterial line monitors in place, the patient underwent general endotracheal anesthesia without incidence.  Skin of the chest and legs were prepped with Betadine and draped in usual sterile manner.  Using the [employer] endo vein harvesting system, vein  was harvested from the right thigh and calf and was of good quality and caliber.  Median sternotomy was performed.  The left internal mammary artery was dissected down as a pedicle graft.  The distal artery was divided and had good free flow.  Pericardium was opened.  Overall ventricular function appeared preserved.  The patient was systemically heparinized.  The ascending aorta was cannulated.  The right atrium was cannulated and aortic root vent cardioplegia needle was introduced into the ascending aorta.  The patient was placed on cardiopulmonary bypass 2.4 L/min/m2.  Sites of anastomosis were suspected and dissected out of the epicardium.  The patient's body temperature was cooled to 32 degrees, aortic cross-clamp was applied, and 500 mL cold blood potassium cardioplegia was administered with diastolic arrest of the heart.  Myocardial septal temperature was monitored throughout the cross-clamp.  Attention was turned first to the distal right coronary artery which was totally occluded.  The vessel was opened and was somewhat thickened, but with a 1.8-mm lumen.  Using a running 7-0 Prolene, distal anastomosis was performed with segment of reverse saphenous vein graft.  The heart was then elevated, and the obtuse marginal and distal circumflex which were diseased into the distal vessel, the obtuse marginal vessel was opened.  Then, using a diamond-type side-to-side anastomosis was carried out with a running 8-0 Prolene.  A short distance from this the branch of the distal circumflex was opened and was slightly smaller and admitted a 1-mm probe.  The vessel was 1.3-mm in size.  Using a running 8-0 Prolene, distal anastomosis was performed.  Attention was then turned to the intermediate coronary artery, which was opened in a side and admitted a 1-mm probe distally.  Using a diamond-type side-to-side anastomosis segment of reverse saphenous vein graft was anastomosed to the intermediate  with a running 8-0 Prolene.  Distal extent of the same vein was then carried to the distal portion of the largest diagonal branch, which was 1.2 mm in size.  Using a running 8-0 Prolene, distal anastomosis was performed.  The LAD was opened between the mid and distal third, and admitted a 1.5-mm probe proximally and a 1-mm probe distally.  Using a running 8-0 Prolene, left internal mammary artery was anastomosed to left anterior descending coronary artery.  With release of the bulldog on the mammary artery, there was rise in myocardial septal temperature.  The bulldog was placed back on the mammary artery and with the cross-clamp still in place, three punch aortotomies were performed and each of the 3 vein grafts anastomosed to the ascending aorta.  Air was evacuated from the grafts and the ascending aorta and aortic cross-clamp was removed with a cross-clamp time of 115 minutes. Prior to removal of the crossclamp, the bulldog on the mammary artery was removed and there was prompt rise in myocardial septal temperature. The patient spontaneously converted to sinus rhythm.  Sites of anastomosis were inspected and were free of bleeding.  With the body temperature rewarmed to 37 degrees, he was then ventilated and weaned from cardiopulmonary bypass without difficulty.  Total pump time was 151 minutes.  He was decannulated in usual fashion.  Protamine sulfate was administered.  With the operative field hemostatic, atrial and ventricular pacing wires had been applied.  A left pleural tube and a Blake mediastinal drain were left in place.  The pericardium was loosely reapproximated.  The sternum was closed with #6 stainless steel wire. Fascia was closed with interrupted 0 Vicryl, running 3-0 Vicryl subcutaneous tissue, and 4-0 subcuticular stitch in skin edges.  Dry dressings were applied.  Sponge and needle count was reported as correct at the completion of the procedure.  The patient tolerated  the procedure without obvious complication and was transferred to the Surgical Intensive Care Unit for further postoperative care.     Lanelle Bal, MD     EG/MEDQ  D:  08/26/2013  T:  08/26/2013  Job:  [id number]

## 2013-08-27 ENCOUNTER — Encounter (HOSPITAL_COMMUNITY): Payer: Self-pay | Admitting: Cardiothoracic Surgery

## 2013-08-27 ENCOUNTER — Inpatient Hospital Stay (HOSPITAL_COMMUNITY): Payer: Medicare Other

## 2013-08-27 LAB — BASIC METABOLIC PANEL
BUN: 21 mg/dL (ref 6–23)
CALCIUM: 8.7 mg/dL (ref 8.4–10.5)
CHLORIDE: 103 meq/L (ref 96–112)
CO2: 25 meq/L (ref 19–32)
Creatinine, Ser: 0.88 mg/dL (ref 0.50–1.35)
GFR calc Af Amer: >90 mL/min (ref >90)
GFR calc non Af Amer: 83 mL/min — ABNORMAL LOW (ref >90)
Glucose, Bld: 136 mg/dL — ABNORMAL HIGH (ref 70–99)
Potassium: 3.8 mEq/L (ref 3.7–5.3)
Sodium: 141 mEq/L (ref 137–147)

## 2013-08-27 LAB — CBC
HCT: 32.3 % — ABNORMAL LOW (ref 39.0–52.0)
Hemoglobin: 11.2 g/dL — ABNORMAL LOW (ref 13.0–17.0)
MCH: 31.6 pg (ref 26.0–34.0)
MCHC: 34.7 g/dL (ref 30.0–36.0)
MCV: 91.2 fL (ref 78.0–100.0)
Platelets: 152 10*3/uL (ref 150–400)
RBC: 3.54 MIL/uL — ABNORMAL LOW (ref 4.22–5.81)
RDW: 13.2 % (ref 11.5–15.5)
WBC: 9.7 10*3/uL (ref 4.0–10.5)

## 2013-08-27 LAB — POCT I-STAT 3, ART BLOOD GAS (G3+)
Acid-base deficit: 3 mmol/L — ABNORMAL HIGH (ref 0.0–2.0)
Bicarbonate: 23 mEq/L (ref 20.0–24.0)
O2 Saturation: 92 %
PCO2 ART: 45.5 mmHg — AB (ref 35.0–45.0)
PH ART: 7.32 — AB (ref 7.350–7.450)
PO2 ART: 76 mmHg — AB (ref 80.0–100.0)
Patient temperature: 38.6
TCO2: 24 mmol/L (ref 0–100)

## 2013-08-27 LAB — POCT I-STAT, CHEM 8
BUN: 13 mg/dL (ref 6–23)
CHLORIDE: 106 meq/L (ref 96–112)
Calcium, Ion: 1.19 mmol/L (ref 1.13–1.30)
Creatinine, Ser: 0.8 mg/dL (ref 0.50–1.35)
Glucose, Bld: 114 mg/dL — ABNORMAL HIGH (ref 70–99)
HCT: 34 % — ABNORMAL LOW (ref 39.0–52.0)
Hemoglobin: 11.6 g/dL — ABNORMAL LOW (ref 13.0–17.0)
Potassium: 4.1 mEq/L (ref 3.7–5.3)
Sodium: 142 mEq/L (ref 137–147)
TCO2: 24 mmol/L (ref 0–100)

## 2013-08-27 LAB — GLUCOSE, CAPILLARY
GLUCOSE-CAPILLARY: 131 mg/dL — AB (ref 70–99)
GLUCOSE-CAPILLARY: 147 mg/dL — AB (ref 70–99)
Glucose-Capillary: 142 mg/dL — ABNORMAL HIGH (ref 70–99)

## 2013-08-27 MED ORDER — METOPROLOL TARTRATE 25 MG PO TABS
37.5000 mg | ORAL_TABLET | Freq: Two times a day (BID) | ORAL | Status: DC
  Administered 2013-08-27 – 2013-08-30 (×7): 37.5 mg via ORAL
  Filled 2013-08-27 (×8): qty 1

## 2013-08-27 MED ORDER — ENOXAPARIN SODIUM 40 MG/0.4ML ~~LOC~~ SOLN
40.0000 mg | SUBCUTANEOUS | Status: DC
  Administered 2013-08-28 – 2013-08-29 (×2): 40 mg via SUBCUTANEOUS
  Filled 2013-08-27 (×3): qty 0.4

## 2013-08-27 MED ORDER — WARFARIN SODIUM 5 MG PO TABS
5.0000 mg | ORAL_TABLET | Freq: Once | ORAL | Status: AC
  Filled 2013-08-27 (×2): qty 1

## 2013-08-27 MED ORDER — WARFARIN VIDEO
Freq: Once | Status: AC
  Administered 2013-08-27: 15:00:00

## 2013-08-27 MED ORDER — METFORMIN HCL 850 MG PO TABS
850.0000 mg | ORAL_TABLET | Freq: Two times a day (BID) | ORAL | Status: DC
  Administered 2013-08-27 – 2013-08-30 (×6): 850 mg via ORAL
  Filled 2013-08-27 (×8): qty 1

## 2013-08-27 MED ORDER — WARFARIN - PHARMACIST DOSING INPATIENT: Freq: Every day | Status: DC

## 2013-08-27 MED ORDER — PATIENT'S GUIDE TO USING COUMADIN BOOK
Freq: Once | Status: DC
  Filled 2013-08-27: qty 1

## 2013-08-27 NOTE — Progress Notes (Signed)
SATURATION QUALIFICATIONS: (This note is used to comply with regulatory documentation for home oxygen)  Patient Saturations on Room Air at Rest = 82%  Patient Saturations on Room Air while Ambulating = 77%  Patient Saturations on 3 Liters of oxygen while Ambulating = 92%  Please briefly explain why patient needs home oxygen:

## 2013-08-27 NOTE — Progress Notes (Addendum)
[address]       [place] [address]             [phone]      4 Days Post-Op  Procedure(s) (LRB): CORONARY ARTERY BYPASS GRAFTING (CABG) (N/A) INTRAOPERATIVE TRANSESOPHAGEAL ECHOCARDIOGRAM (N/A) ENDOVEIN HARVEST OF GREATER SAPHENOUS VEIN (Right) Subjective Feels pretty well  Objective  Telemetry afib with RVR  Temp:  [98 F (36.7 C)-101.9 F (38.8 C)] 99 F (37.2 C) (01/20 0444) Pulse Rate:  [79-102] 84 (01/20 0444) Resp:  [18-20] 18 (01/20 0444) BP: (129-156)/(60-75) 146/60 mmHg (01/20 0444) SpO2:  [91 %-98 %] 92 % (01/20 0444) Weight:  [179 lb 0.2 oz (81.2 kg)] 179 lb 0.2 oz (81.2 kg) (01/20 0444)   Intake/Output Summary (Last 24 hours) at 08/27/13 0825 Last data filed at 08/26/13 0900  Gross per 24 hour  Intake    320 ml  Output      0 ml  Net    320 ml       General appearance: alert, cooperative and no distress Heart: irregularly irregular rhythm Lungs: dim in lower fields Abdomen: benign Extremities: no sig edema noted Wound: incis healing well   Lab Results:  Recent Labs  08/24/13 1655  08/26/13 0305 08/27/13 0445  NA  --   < > 143 141  K  --   < > 5.2 3.8  CL  --   < > 104 103  CO2  --   < > 28 25  GLUCOSE  --   < > 92 136*  BUN  --   < > 17 21  CREATININE 0.88  < > 0.91 0.88  CALCIUM  --   < > 9.1 8.7  MG 2.4  --   --   --   < > = values in this interval not displayed. No results found for this basename: AST, ALT, ALKPHOS, BILITOT, PROT, ALBUMIN,  in the last 72 hours No results found for this basename: LIPASE, AMYLASE,  in the last 72 hours  Recent Labs  08/26/13 0305 08/27/13 0445  WBC 13.1* 9.7  HGB 11.2* 11.2*  HCT 32.4* 32.3*  MCV 92.3 91.2  PLT 112* 152   No results found for this basename: CKTOTAL, CKMB, TROPONINI,  in the last 72 hours No components found with this basename: POCBNP,  No results found for this basename: DDIMER,  in the last 72 hours No results found for this basename: HGBA1C,   in the last 72 hours No results found for this basename: CHOL, HDL, LDLCALC, TRIG, CHOLHDL,  in the last 72 hours No results found for this basename: TSH, T4TOTAL, FREET3, T3FREE, THYROIDAB,  in the last 72 hours No results found for this basename: VITAMINB12, FOLATE, FERRITIN, TIBC, IRON, RETICCTPCT,  in the last 72 hours  Medications: Scheduled . acetaminophen  1,000 mg Oral Q6H  . acyclovir  400 mg Oral BID  . amiodarone  200 mg Oral BID PC  . bisacodyl  10 mg Oral Daily   Or  . bisacodyl  10 mg Rectal Daily  . ciprofloxacin  250 mg Oral BID  . docusate sodium  200 mg Oral Daily  . enoxaparin (LOVENOX) injection  30 mg Subcutaneous Daily  . fenofibrate  160 mg Oral QHS  . furosemide  40 mg Oral Daily  . gabapentin  600 mg Oral BID  . insulin aspart  0-24 Units Subcutaneous Q4H  . losartan  25 mg Oral Daily  .  metFORMIN  500 mg Oral BID WC  . metoprolol tartrate  25 mg Oral BID  . pantoprazole  40 mg Oral Daily  . psyllium  1 packet Oral Daily  . sodium chloride  3 mL Intravenous Q12H     Radiology/Studies:  Dg Chest 2 View  08/27/2013   CLINICAL DATA:  Follow up CABG  EXAM: CHEST  2 VIEW  COMPARISON:  08/26/2013; 08/22/2013; 08/25/2013 07/29/2009  FINDINGS: Grossly unchanged borderline enlarged cardiac silhouette and mediastinal contours post median sternotomy and CABG. Grossly unchanged small bilateral effusions with associated bibasilar opacities. No new focal airspace opacities. No evidence of edema. No pneumothorax. Grossly unchanged bones.  IMPRESSION: 1. Grossly unchanged small bilateral effusions with associated bibasilar opacities, likely atelectasis. 2. No definite evidence of edema.   Electronically Signed   By: Sandi Mariscal M.D.   On: 08/27/2013 07:50   Dg Chest 2 View  08/26/2013   CLINICAL DATA:  Followup CABG  EXAM: CHEST  2 VIEW  COMPARISON:  08/25/2013  FINDINGS: There has been interval removal of the right IJ catheter. Postoperative changes compatible with CABG  procedure are again identified. There are bilateral pleural effusions stress set there are small to moderate bilateral pleural effusions which appear unchanged in volume from previous exam. Bibasilar atelectasis is also present.  IMPRESSION: 1. Bilateral pleural effusions and bibasilar atelectasis, unchanged from previous exam.   Electronically Signed   By: Kerby Moors M.D.   On: 08/26/2013 07:52    INR: Will add last result for INR, ABG once components are confirmed Will add last 4 CBG results once components are confirmed  Assessment/Plan: S/P Procedure(s) (LRB): CORONARY ARTERY BYPASS GRAFTING (CABG) (N/A) INTRAOPERATIVE TRANSESOPHAGEAL ECHOCARDIOGRAM (N/A) ENDOVEIN HARVEST OF GREATER SAPHENOUS VEIN (Right)  1 In afib- conts amio/beta-blocker but will increase lopressor with HTN and increased rate. Check TSH. Other labs stable. Platelets cont to improve 2 cont gentle diuresis - d/c lasix soon 3 sugars a bit elevated- will increase metformin 4 wean O2 as able, push rehab and pulm toilet     LOS: 6 days    GOLD,WAYNE E 1/20/20158:25 AM  Now back in aflutter / afib for almost 24 hours Rate controlled Will start coumadin Attempt to rapid a pace did not convert the flutter I have seen and examined [name] and agree with the above assessment  and plan.  Grace Isaac MD Beeper [phone] Office [phone] 08/27/2013 2:25 PM

## 2013-08-27 NOTE — Progress Notes (Signed)
Patient ambulated approx. 300 feet this morning. Patient ambulated independetly. Patient did complain of chest pain that he said quickly subsided. While ambulating patient has run of SVT, HR 150's.

## 2013-08-27 NOTE — Progress Notes (Signed)
CARDIAC REHAB PHASE I   PRE:  Rate/Rhythm: 80 afib    BP: sitting 110/60    SaO2: 84 RA lying flat, 90 1L  MODE:  Ambulation: 1090 ft   POST:  Rate/Rhythm: 102 afib    BP: sitting 116/74     SaO2: 88-89 2L  Pt able to walk with RW independently. Still requiring O2. Denied fatigue or c/o walking. Practiced IS after walk (1500 mL). To recliner. Anxious to walk again later.  [place], [place], ACSM 08/27/2013 3:25 PM

## 2013-08-27 NOTE — Progress Notes (Signed)
Patient is refusing to take Coumadin. Patient states that he was on an anti-coagulant in the past and it "was causing the blood vessels in his eyes to burst, resulting in him having red eyes." Patient stated that this particular MD told him not to take any anti-coagulants. Patient states this was approx 2 years ago.   MD notified of this refusual. MD will follow up with patient. Elmarie Shiley R

## 2013-08-28 DIAGNOSIS — I209 Angina pectoris, unspecified: Secondary | ICD-10-CM

## 2013-08-28 DIAGNOSIS — I4891 Unspecified atrial fibrillation: Secondary | ICD-10-CM | POA: Diagnosis not present

## 2013-08-28 DIAGNOSIS — E1149 Type 2 diabetes mellitus with other diabetic neurological complication: Secondary | ICD-10-CM

## 2013-08-28 DIAGNOSIS — Z951 Presence of aortocoronary bypass graft: Secondary | ICD-10-CM

## 2013-08-28 DIAGNOSIS — I480 Paroxysmal atrial fibrillation: Secondary | ICD-10-CM | POA: Diagnosis not present

## 2013-08-28 LAB — GLUCOSE, CAPILLARY
GLUCOSE-CAPILLARY: 160 mg/dL — AB (ref 70–99)
GLUCOSE-CAPILLARY: 115 mg/dL — AB (ref 70–99)
Glucose-Capillary: 141 mg/dL — ABNORMAL HIGH (ref 70–99)
Glucose-Capillary: 137 mg/dL — ABNORMAL HIGH (ref 70–99)
Glucose-Capillary: 152 mg/dL — ABNORMAL HIGH (ref 70–99)

## 2013-08-28 LAB — BASIC METABOLIC PANEL
BUN: 22 mg/dL (ref 6–23)
CO2: 23 mEq/L (ref 19–32)
Calcium: 8.5 mg/dL (ref 8.4–10.5)
Chloride: 101 mEq/L (ref 96–112)
Creatinine, Ser: 0.88 mg/dL (ref 0.50–1.35)
GFR calc Af Amer: >90 mL/min (ref >90)
GFR calc non Af Amer: 83 mL/min — ABNORMAL LOW (ref >90)
Glucose, Bld: 135 mg/dL — ABNORMAL HIGH (ref 70–99)
Potassium: 3.6 mEq/L — ABNORMAL LOW (ref 3.7–5.3)
Sodium: 140 mEq/L (ref 137–147)

## 2013-08-28 LAB — CBC
HCT: 31.8 % — ABNORMAL LOW (ref 39.0–52.0)
Hemoglobin: 11 g/dL — ABNORMAL LOW (ref 13.0–17.0)
MCH: 32.4 pg (ref 26.0–34.0)
MCHC: 34.6 g/dL (ref 30.0–36.0)
MCV: 93.8 fL (ref 78.0–100.0)
Platelets: 181 10*3/uL (ref 150–400)
RBC: 3.39 MIL/uL — ABNORMAL LOW (ref 4.22–5.81)
RDW: 13.4 % (ref 11.5–15.5)
WBC: 7.3 10*3/uL (ref 4.0–10.5)

## 2013-08-28 LAB — PROTIME-INR
INR: 1.15 (ref 0.00–1.49)
Prothrombin Time: 14.5 seconds (ref 11.6–15.2)

## 2013-08-28 LAB — TSH: TSH: 2.553 u[IU]/mL (ref 0.350–4.500)

## 2013-08-28 MED ORDER — WARFARIN - PHYSICIAN DOSING INPATIENT: Freq: Every day | Status: DC

## 2013-08-28 MED ORDER — COUMADIN BOOK
Freq: Once | Status: AC
  Administered 2013-08-28: 16:00:00
  Filled 2013-08-28: qty 1

## 2013-08-28 MED ORDER — LOSARTAN POTASSIUM 50 MG PO TABS
50.0000 mg | ORAL_TABLET | Freq: Every day | ORAL | Status: DC
  Administered 2013-08-28 – 2013-08-30 (×3): 50 mg via ORAL
  Filled 2013-08-28 (×3): qty 1

## 2013-08-28 MED ORDER — WARFARIN VIDEO: Freq: Once | Status: DC

## 2013-08-28 MED ORDER — WARFARIN SODIUM 5 MG PO TABS
5.0000 mg | ORAL_TABLET | Freq: Once | ORAL | Status: AC
  Administered 2013-08-28: 5 mg via ORAL
  Filled 2013-08-28: qty 1

## 2013-08-28 NOTE — Progress Notes (Signed)
Pt ambulated 890 feet with RN; no walker needed; steady gait noted; pt back to room to sit in chair after ambulation; no c/o pain; will cont. To monitor.

## 2013-08-28 NOTE — Progress Notes (Signed)
Spoke with PA to make aware of pt's current state; pt states he still feels "terrible"; VSS; order to cont. To monitor and notify PA if any changes in vitals; will cont. To monitor.

## 2013-08-28 NOTE — Progress Notes (Addendum)
      [address]       [place] [address]             [phone]      5 Days Post-Op Procedure(s) (LRB): CORONARY ARTERY BYPASS GRAFTING (CABG) (N/A) INTRAOPERATIVE TRANSESOPHAGEAL ECHOCARDIOGRAM (N/A) ENDOVEIN HARVEST OF GREATER SAPHENOUS VEIN (Right)  Subjective:  [name] [name] remains in A. Fib this morning.  He refused Coumadin last night, stating his opthamologist instructed him to avoid all blood thinners due to previous bleeding problems with his eye.  I spoke with patient regarding the reason and importance of using Coumadin to aid in stroke prevention.  He is agreeable to use Coumadin after receiving explanation.  Objective: Vital signs in last 24 hours: Temp:  [98.3 F (36.8 C)-99.3 F (37.4 C)] 98.3 F (36.8 C) (01/21 0515) Pulse Rate:  [74-103] 92 (01/21 0515) Cardiac Rhythm:  [-] Atrial fibrillation (01/21 0753) Resp:  [20] 20 (01/21 0515) BP: (112-151)/(58-87) 151/87 mmHg (01/21 0515) SpO2:  [92 %-93 %] 93 % (01/21 0515) Weight:  [176 lb 9.4 oz (80.1 kg)] 176 lb 9.4 oz (80.1 kg) (01/21 0515)  Intake/Output from previous day: 01/20 0701 - 01/21 0700 In: 840 [P.O.:840] Out: 2150 [Urine:2150]  General appearance: alert, cooperative and no distress Heart: irregularly irregular rhythm Lungs: clear to auscultation bilaterally Abdomen: soft, non-tender; bowel sounds normal; no masses,  no organomegaly Extremities: edema trac Wound: clean and dry, ecchymosis RLE  Lab Results:  Recent Labs  08/27/13 0445 08/28/13 0505  WBC 9.7 7.3  HGB 11.2* 11.0*  HCT 32.3* 31.8*  PLT 152 181   BMET:  Recent Labs  08/27/13 0445 08/28/13 0505  NA 141 140  K 3.8 3.6*  CL 103 101  CO2 25 23  GLUCOSE 136* 135*  BUN 21 22  CREATININE 0.88 0.88  CALCIUM 8.7 8.5    PT/INR:  Recent Labs  08/28/13 0505  LABPROT 14.5  INR 1.15   ABG    Component Value Date/Time   PHART 7.337* 08/23/2013 2008   HCO3 23.0 08/23/2013 2008   TCO2 23 08/24/2013 1650   ACIDBASEDEF 2.0 08/23/2013 2008   O2SAT 91.0 08/23/2013 2008   CBG (last 3)   Recent Labs  08/27/13 1625 08/27/13 2230 08/28/13 0618  GLUCAP 147* 141* 160*    Assessment/Plan: S/P Procedure(s) (LRB): CORONARY ARTERY BYPASS GRAFTING (CABG) (N/A) INTRAOPERATIVE TRANSESOPHAGEAL ECHOCARDIOGRAM (N/A) ENDOVEIN HARVEST OF GREATER SAPHENOUS VEIN (Right)  1.CV- Atrial Fibrillation- on Amiodarone, Lopressor, remains hypertensive- will increase Lopressor- patient agreeable to start coumadin 2. Pulm- no acute issues, continue IS 3. Renal- creatinine WNL, volume status is below baseline, on Lasix 4. DM- CBGs controlled, on Metformin 5. Dispo- patient stable, continued rate controlled A. Fib, will start Coumadin tonight, will have cardiologist see him, leave EPW today   LOS: 7 days    BARRETT, ERIN 08/28/2013  I have seen and examined [name] [name] and agree with the above assessment  and plan.  Grace Isaac MD Beeper [phone] Office [phone] 08/28/2013 3:34 PM

## 2013-08-28 NOTE — Progress Notes (Signed)
CARDIAC REHAB PHASE I   PRE:  Rate/Rhythm: 82 Afib  BP:  Supine: 96/40  Sitting: 110/64  Standing:    SaO2: 88 RA  MODE:  Ambulation: 890 ft   POST:  Rate/Rhythm: 84 Afib  BP:  Supine:   Sitting: 140/80  Standing:    SaO2: 95 RA 1420-1500 On arrival pt in bed sleeping. Aroused easily, pt very diaphoretic. Changed pt's gown and sat him on side of bed. VS stable. Pt stood without c/o. Pt able to walk 890 feet. Steady gait. To recliner after walk with call light in reach.  Rodney Langton RN 08/28/2013 2:54 PM

## 2013-08-28 NOTE — Progress Notes (Signed)
Pt ambulated 550 feet with SN at this time; no walker needed; steady gait; pt assisted back to room; PA in room to see pt at this time; will cont. To monitor.

## 2013-08-28 NOTE — Progress Notes (Signed)
Pt c/o feeling "crappy"; states one minute he is burning up and next minute he is sweating; pt states he just doesn't feel; VSS; no temp noted; pt not wanting to get up OOB to ambulate; this morning pt's mood much different than now; pt felt great, willing and motivate to ambulate; will make PA aware; will cont. To monitor.

## 2013-08-28 NOTE — Progress Notes (Signed)
TELEMETRY: Reviewed telemetry pt in atrial fibrillation with controlled ventricular response.: Filed Vitals:   08/28/13 0515 08/28/13 0855 08/28/13 1013 08/28/13 1230  BP: 151/87  129/68 128/68  Pulse: 92  96 86  Temp: 98.3 F (36.8 C)     TempSrc: Oral   Oral  Resp: 20 20  20   Height:      Weight: 176 lb 9.4 oz (80.1 kg)     SpO2: 93%  92% 94%    Intake/Output Summary (Last 24 hours) at 08/28/13 1256 Last data filed at 08/28/13 1018  Gross per 24 hour  Intake   1080 ml  Output   2100 ml  Net  -1020 ml    SUBJECTIVE Feels tired today. States he was very active yesterday. No chest pain or SOB.  LABS: Basic Metabolic Panel:  Recent Labs  08/27/13 0445 08/28/13 0505  NA 141 140  K 3.8 3.6*  CL 103 101  CO2 25 23  GLUCOSE 136* 135*  BUN 21 22  CREATININE 0.88 0.88  CALCIUM 8.7 8.5   Liver Function Tests: No results found for this basename: AST, ALT, ALKPHOS, BILITOT, PROT, ALBUMIN,  in the last 72 hours No results found for this basename: LIPASE, AMYLASE,  in the last 72 hours CBC:  Recent Labs  08/27/13 0445 08/28/13 0505  WBC 9.7 7.3  HGB 11.2* 11.0*  HCT 32.3* 31.8*  MCV 91.2 93.8  PLT 152 181   Cardiac Enzymes: No results found for this basename: CKTOTAL, CKMB, CKMBINDEX, TROPONINI,  in the last 72 hours Thyroid Function Tests:  Recent Labs  08/28/13 0505  TSH 2.553   Radiology/Studies:  Dg Chest 2 View  08/27/2013   CLINICAL DATA:  Follow up CABG  EXAM: CHEST  2 VIEW  COMPARISON:  08/26/2013; 08/22/2013; 08/25/2013 07/29/2009  FINDINGS: Grossly unchanged borderline enlarged cardiac silhouette and mediastinal contours post median sternotomy and CABG. Grossly unchanged small bilateral effusions with associated bibasilar opacities. No new focal airspace opacities. No evidence of edema. No pneumothorax. Grossly unchanged bones.  IMPRESSION: 1. Grossly unchanged small bilateral effusions with associated bibasilar opacities, likely atelectasis. 2. No  definite evidence of edema.   Electronically Signed   By: Sandi Mariscal M.D.   On: 08/27/2013 07:50     PHYSICAL EXAM General: Well developed, well nourished, in no acute distress. Head: Normocephalic, atraumatic, sclera non-icteric, no xanthomas, nares are without discharge. Neck: Negative for carotid bruits. JVD not elevated. Lungs: Clear bilaterally to auscultation without wheezes, rales, or rhonchi. Breathing is unlabored. Heart: IRRR S1 S2 without murmurs, rubs, or gallops. Sternal incision is healing well. Abdomen: Soft, non-tender, non-distended with normoactive bowel sounds. No hepatomegaly. No rebound/guarding. No obvious abdominal masses. Msk:  Strength and tone appears normal for age. Extremities: No clubbing, cyanosis or edema.  Distal pedal pulses are 2+ and equal bilaterally. Neuro: Alert and oriented X 3. Moves all extremities spontaneously. Psych:  Responds to questions appropriately with a normal affect.  ASSESSMENT AND PLAN: 1. 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 LV function is good.  2. Post operative atrial fibrillation. Rate controlled on amiodarone and lopressor. Persistent since surgery. CHAD-Vasc score of 3. 3. HTN 4. DM type 2 5. Hyperlipidemia  Plan: agree with Amiodarone and metoprolol. Recommend anticoagulation. Patient concerned about prior "burst" blood vessel in his eye. This sound very superficial. He is agreeable to proceed with coumadin. Hopefully he will convert on amiodarone. If he doesn't convert would consider DCCV in 4 weeks.  Active Problems:   Hypercholesteremia   DM type 2 causing neurological disease   Unstable angina   S/P CABG x 6   Atrial fibrillation    Signed, Tyree Fluharty Martinique MD,FACC 08/28/2013 12:56 PM

## 2013-08-29 LAB — GLUCOSE, CAPILLARY
GLUCOSE-CAPILLARY: 131 mg/dL — AB (ref 70–99)
GLUCOSE-CAPILLARY: 150 mg/dL — AB (ref 70–99)
Glucose-Capillary: 133 mg/dL — ABNORMAL HIGH (ref 70–99)
Glucose-Capillary: 145 mg/dL — ABNORMAL HIGH (ref 70–99)
Glucose-Capillary: 135 mg/dL — ABNORMAL HIGH (ref 70–99)

## 2013-08-29 LAB — PROTIME-INR
INR: 1.23 (ref 0.00–1.49)
Prothrombin Time: 15.2 seconds (ref 11.6–15.2)

## 2013-08-29 MED ORDER — INSULIN ASPART 100 UNIT/ML ~~LOC~~ SOLN: 0.0000 [IU] | Freq: Three times a day (TID) | SUBCUTANEOUS | Status: DC

## 2013-08-29 NOTE — Progress Notes (Signed)
EPW d/c at this time; VSS: bedrest until 0945; pt tolerated well; no oozing noted from either EPW site; will cont. To monitor.

## 2013-08-29 NOTE — Progress Notes (Signed)
Pt ambulated 890 feet with RN; steady gait noted; no CP, no SOB; HR SR; pt back to room; call bell w/i reach; will cont. To monitor.

## 2013-08-29 NOTE — Progress Notes (Signed)
Per PA, no Coumadin since pt in SR; no Coumadin needed at d/c.

## 2013-08-29 NOTE — Progress Notes (Addendum)
      [address]       [place] [address]             [phone]      6 Days Post-Op Procedure(s) (LRB): CORONARY ARTERY BYPASS GRAFTING (CABG) (N/A) INTRAOPERATIVE TRANSESOPHAGEAL ECHOCARDIOGRAM (N/A) ENDOVEIN HARVEST OF GREATER SAPHENOUS VEIN (Right)  Subjective:  Patient had episode yesterday afternoon where he states he just didn't feel good.  He was chilled, lost his appetite and didn't have much energy.  He feels good this morning,  No complaints. + ambulation + BM Objective: Vital signs in last 24 hours: Temp:  [97.7 F (36.5 C)-99.2 F (37.3 C)] 99.2 F (37.3 C) (01/22 0621) Pulse Rate:  [73-96] 73 (01/22 0621) Cardiac Rhythm:  [-] Atrial fibrillation (01/21 2100) Resp:  [18-20] 18 (01/22 0621) BP: (128-155)/(64-76) 155/76 mmHg (01/22 0621) SpO2:  [91 %-96 %] 91 % (01/22 0621) Weight:  [174 lb 9.7 oz (79.2 kg)] 174 lb 9.7 oz (79.2 kg) (01/22 0621)  Intake/Output from previous day: 01/21 0701 - 01/22 0700 In: 600 [P.O.:600] Out: 1000 [Urine:1000]  General appearance: alert, cooperative and no distress Heart: regular rate and rhythm Lungs: clear to auscultation bilaterally Abdomen: soft, non-tender; bowel sounds normal; no masses,  no organomegaly Extremities: edema trac Wound: clean and dry  Lab Results:  Recent Labs  08/27/13 0445 08/28/13 0505  WBC 9.7 7.3  HGB 11.2* 11.0*  HCT 32.3* 31.8*  PLT 152 181   BMET:  Recent Labs  08/27/13 0445 08/28/13 0505  NA 141 140  K 3.8 3.6*  CL 103 101  CO2 25 23  GLUCOSE 136* 135*  BUN 21 22  CREATININE 0.88 0.88  CALCIUM 8.7 8.5    PT/INR:  Recent Labs  08/29/13 0530  LABPROT 15.2  INR 1.23   ABG    Component Value Date/Time   PHART 7.337* 08/23/2013 2008   HCO3 23.0 08/23/2013 2008   TCO2 23 08/24/2013 1650   ACIDBASEDEF 2.0 08/23/2013 2008   O2SAT 91.0 08/23/2013 2008   CBG (last 3)   Recent Labs  08/28/13 1608 08/28/13 2117 08/29/13 0620  GLUCAP 152* 115* 131*     Assessment/Plan: S/P Procedure(s) (LRB): CORONARY ARTERY BYPASS GRAFTING (CABG) (N/A) INTRAOPERATIVE TRANSESOPHAGEAL ECHOCARDIOGRAM (N/A) ENDOVEIN HARVEST OF GREATER SAPHENOUS VEIN (Right)  1. CV-Previous A. Fib, currently NSR- on Lopressor, Amiodarone- will discuss need for further Coumadin with staff 2. pulm- no acute issues, good use of IS 3. Renal- creatinine stable, weight is below baseline on Lasix 4. DM- sugars controlled 5. Dispo- patient doing well, now in Sinus, will d/c EPW, possibly home in AM   LOS: 8 days    BARRETT, ERIN 08/29/2013  Now back in sinus Watch rhythm until tomorrow if no further afib plan d/c If no further afib will not d/c home on coumadin I have seen and examined [name] [name] and agree with the above assessment  and plan.  Grace Isaac MD Beeper [phone] Office [phone] 08/29/2013 9:03 AM

## 2013-08-29 NOTE — Progress Notes (Signed)
Pt up ambulating independently in hallway at this time; no distress noted; will cont. To monitor.

## 2013-08-29 NOTE — Discharge Summary (Signed)
[address]       [place] [address]             [phone]              Discharge Summary  Name: [name] [name] DOB: 10-15-1940 73 y.o. MRN: [id number]   Admission Date: 08/21/2013 Discharge Date:     Admitting Diagnosis: Chest pain   Discharge Diagnosis:  Severe three vessel coronary artery disease Expected postop blood loss anemia Postoperative atrial fibrillation/atrial flutter  Past Medical History  Diagnosis Date  . Diabetes mellitus   . Hypertension   . Chest pain   . Flank pain   . Diabetic neuropathy   . Anxiety   . Hypercholesterolemia   . History of bronchitis   . History of chicken pox   . Heart murmur   . Kidney stones   . 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 Diverticulosis   . Osteoarthritis   . Fatty liver   . Diverticulosis   . Family history of anesthesia complication     ' they cant wake my brother very easy"  . Coronary artery disease 08/21/2013    severe triple vessel  . Anginal pain   . GERD (gastroesophageal reflux disease)       Procedures: CORONARY ARTERY BYPASS GRAFTING x 6 (Left internal mammary artery to left anterior descending, sequential saphenous vein graft to intermediate and diagonal coronaries, sequential saphenous vein graft to obtuse marginal and distal circumflex, saphenous vein graft to distal right coronary artery) ENDOSCOPIC VEIN HARVEST RIGHT LEG - 08/23/2013    HPI:  The patient is a 73 y.o. male recently referred to Dr. Angelena Form for evaluation of chest pain and an abnormal stress test. He was seen by Dr. Acie Fredrickson 2 years ago for chest pain and underwent evaluation with a treadmill nuclear stress test as well as an echocardiogram. Both of them were unremarkable. Over the last 3-4 months, he has been experiencing exertional substernal chest tightness and burning sensation. The pain occurs when he is on the treadmill, starting in the left chest wall and radiating to his left  shoulder and arm. He was seen by Dr. Fletcher Anon in our [facility] office 07/19/13 and had an exercise treadmill stress test with ST depression and chest pain. Cardiac cath was recommended but he wanted to think about this, and ultimately presented for second opinion. It was once again recommended for him to proceed with catheterization, and he agreed at this time.  He was scheduled for the procedure on 08/21/2012.    Hospital Course:  The patient was admitted to [facility] on 08/21/2013.  He underwent catheterization by Dr. Angelena Form which revealed preserved LVEF at 60% and multivessel coronary artery disease not felt to be amenable to percutaneous intervention.  A cardiac surgery consult was requested and Dr. Servando Snare saw the patient and reviewed his films. He agreed with the need for surgical revascularization.  All risks, benefits and alternatives of surgery were explained in detail, and the patient agreed to proceed. The patient was taken to the operating room and underwent the above procedure.    The postoperative course has been notable for atrial fibrillation.  He was treated with Amiodarone.  He converted to normal sinus rhythm, but later reverted back to rate controlled atrial fibrillation and atrial flutter.  An attempt to rapid atrially pace him was unsuccessful. He was started on Coumadin, but had concerns about taking it  as he has a history of a "burst blood vessel" in his eye.  He did convert back to sinus and it was felt that if he had no further arrhythmias, he would not need to resume anticoagulation.    He has otherwise remained stable postoperatively.  He has been restarted on his home diabetes medications and blood sugars have been stable.  He is ambulating with cardiac rehab and progressing well with mobility.  Incisions are healing well.  He is tolerating a regular diet.  We anticipate discharge home in 24-48 hours provided his rhythm remains stable and no other changes occur.     Recent  vital signs:  Filed Vitals:   08/29/13 0921  BP: 137/67  Pulse: 84  Temp:   Resp:     Recent laboratory studies:  CBC: Recent Labs  08/27/13 0445 08/28/13 0505  WBC 9.7 7.3  HGB 11.2* 11.0*  HCT 32.3* 31.8*  PLT 152 181   BMET:  Recent Labs  08/27/13 0445 08/28/13 0505  NA 141 140  K 3.8 3.6*  CL 103 101  CO2 25 23  GLUCOSE 136* 135*  BUN 21 22  CREATININE 0.88 0.88  CALCIUM 8.7 8.5    PT/INR:  Recent Labs  08/29/13 0530  LABPROT 15.2  INR 1.23     Discharge Medications:      Medication List    STOP taking these medications       nitroGLYCERIN 0.4 MG SL tablet  Commonly known as:  NITROSTAT      TAKE these medications       acyclovir 400 MG tablet  Commonly known as:  ZOVIRAX  Take 400 mg by mouth 2 (two) times daily.     amiodarone 200 MG tablet  Commonly known as:  PACERONE  Take 1 tablet (200 mg total) by mouth 2 (two) times daily after a meal. For 7 Days, then decrease to 1 tablet daily     aspirin EC 81 MG tablet  Take 1 tablet (81 mg total) by mouth daily.     calcium-vitamin D 250-125 MG-UNIT per tablet  Commonly known as:  OSCAL WITH D  Take 1 tablet by mouth daily.     ciprofloxacin 250 MG tablet  Commonly known as:  CIPRO  Take 250 mg by mouth 2 (two) times daily.     diclofenac 75 MG EC tablet  Commonly known as:  VOLTAREN  Take 75 mg by mouth 2 (two) times daily.     fenofibrate 160 MG tablet  Take 160 mg by mouth at bedtime.     furosemide 20 MG tablet  Commonly known as:  LASIX  Take 1 tablet (20 mg total) by mouth daily. For 5 Days     gabapentin 600 MG tablet  Commonly known as:  NEURONTIN  Take 600 mg by mouth 2 (two) times daily.     losartan 50 MG tablet  Commonly known as:  COZAAR  Take 1 tablet (50 mg total) by mouth daily.     metFORMIN 850 MG tablet  Commonly known as:  GLUCOPHAGE  Take 1 tablet (850 mg total) by mouth 2 (two) times daily with a meal.     metoprolol tartrate 25 MG tablet    Commonly known as:  LOPRESSOR  Take 1.5 tablets (37.5 mg total) by mouth 2 (two) times daily.     oxyCODONE 5 MG immediate release tablet  Commonly known as:  Oxy IR/ROXICODONE  Take 1-2 tablets (5-10 mg total)  by mouth every 3 (three) hours as needed for moderate pain.     potassium chloride 10 MEQ tablet  Commonly known as:  K-DUR  Take 1 tablet (10 mEq total) by mouth daily. For 5 Days     traMADol 50 MG tablet  Commonly known as:  ULTRAM  Take 1-2 tablets (50-100 mg total) by mouth every 4 (four) hours as needed for moderate pain.          Discharge Instructions:  The patient is to refrain from driving, heavy lifting or strenuous activity.  May shower daily and clean incisions with soap and water.  May resume regular diet.   Follow Up: Follow-up Information   Follow up with Lauree Chandler, MD On 09/25/2013. (at 8:00 am)    Specialty:  Cardiology   Contact information:   [place] [place] [address] [phone]       Follow up with Grace Isaac, MD On 09/19/2013. (Have a chest x-ray at [facility]  at 3:30, then see MD at 4:30 )    Specialty:  Cardiothoracic Surgery   Contact information:   [address] [place] [address] [phone]      The patient has been discharged on:  1.Beta Blocker: Yes [ x ]  No [ ]   If No, reason:    2.Ace Inhibitor/ARB: Yes [x ]  No [  ]  If No, reason:    3.Statin: Yes [  ]  No [ x ]  If No, reason: reported intolerance   4.[place]: Yes [ x ]  No [  ]  If No, reason:    COLLINS,GINA H 08/29/2013, 12:25 PM

## 2013-08-29 NOTE — Progress Notes (Signed)
Pt ambulating independently in hallway.  No distress noted.  Will continue to monitor closely.  Pt back to bed with call bell within reach.  Rolland Bimler, RN

## 2013-08-29 NOTE — Progress Notes (Signed)
CARDIAC REHAB PHASE I   PRE:  Rate/Rhythm: 83SR  BP:  Supine:   Sitting: 130/70  Standing:    SaO2: 92%RA  MODE:  Ambulation: 1040 ft   POST:  Rate/Rhythm: 86-92 SR  BP:  Supine: 144/74  Sitting:   Standing:    SaO2: 90-91%RA 1055-1125 Pt walked 1040 ft on RA with hand held asst with steady gait. Tolerated well. Stopped a couple of times to rest but no SOB noted. To bed after walk.   Graylon Good, RN BSN  08/29/2013 11:21 AM

## 2013-08-29 NOTE — Progress Notes (Signed)
TELEMETRY: Reviewed telemetry pt in NSR.: Filed Vitals:   08/29/13 0837 08/29/13 0849 08/29/13 0905 08/29/13 0921  BP: 134/65 136/59 135/66 137/67  Pulse: 84 76 84 84  Temp:      TempSrc:      Resp:      Height:      Weight:      SpO2:        Intake/Output Summary (Last 24 hours) at 08/29/13 1202 Last data filed at 08/29/13 1042  Gross per 24 hour  Intake    360 ml  Output    550 ml  Net   -190 ml    SUBJECTIVE Feels good today. No chest pain or SOB.  LABS: Basic Metabolic Panel:  Recent Labs  08/27/13 0445 08/28/13 0505  NA 141 140  K 3.8 3.6*  CL 103 101  CO2 25 23  GLUCOSE 136* 135*  BUN 21 22  CREATININE 0.88 0.88  CALCIUM 8.7 8.5   Liver Function Tests: No results found for this basename: AST, ALT, ALKPHOS, BILITOT, PROT, ALBUMIN,  in the last 72 hours No results found for this basename: LIPASE, AMYLASE,  in the last 72 hours CBC:  Recent Labs  08/27/13 0445 08/28/13 0505  WBC 9.7 7.3  HGB 11.2* 11.0*  HCT 32.3* 31.8*  MCV 91.2 93.8  PLT 152 181   Cardiac Enzymes: No results found for this basename: CKTOTAL, CKMB, CKMBINDEX, TROPONINI,  in the last 72 hours Thyroid Function Tests:  Recent Labs  08/28/13 0505  TSH 2.553   Radiology/Studies:  Dg Chest 2 View  08/27/2013   CLINICAL DATA:  Follow up CABG  EXAM: CHEST  2 VIEW  COMPARISON:  08/26/2013; 08/22/2013; 08/25/2013 07/29/2009  FINDINGS: Grossly unchanged borderline enlarged cardiac silhouette and mediastinal contours post median sternotomy and CABG. Grossly unchanged small bilateral effusions with associated bibasilar opacities. No new focal airspace opacities. No evidence of edema. No pneumothorax. Grossly unchanged bones.  IMPRESSION: 1. Grossly unchanged small bilateral effusions with associated bibasilar opacities, likely atelectasis. 2. No definite evidence of edema.   Electronically Signed   By: Sandi Mariscal M.D.   On: 08/27/2013 07:50     PHYSICAL EXAM General: Well developed,  well nourished, in no acute distress. Head: Normocephalic, atraumatic, sclera non-icteric, no xanthomas, nares are without discharge. Neck: Negative for carotid bruits. JVD not elevated. Lungs: Clear bilaterally to auscultation without wheezes, rales, or rhonchi. Breathing is unlabored. Heart: RRR S1 S2 without murmurs, rubs, or gallops. Sternal incision is healing well. Abdomen: Soft, non-tender, non-distended with normoactive bowel sounds. No hepatomegaly. No rebound/guarding. No obvious abdominal masses. Msk:  Strength and tone appears normal for age. Extremities: No clubbing, cyanosis or edema.  Distal pedal pulses are 2+ and equal bilaterally. Neuro: Alert and oriented X 3. Moves all extremities spontaneously. Psych:  Responds to questions appropriately with a normal affect.  ASSESSMENT AND PLAN: 1. 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 LV function is good.  2. Post operative atrial fibrillation. Converted to NSR about 3 am today. On amiodarone and lopressor.  CHAD-Vasc score of 3. 3. HTN 4. DM type 2 5. Hyperlipidemia  Plan: agree with Amiodarone and metoprolol. Will observe on monitor. Anticipate DC tomorrow. Agree with stopping coumadin if no further Afib.  Active Problems:   Hypercholesteremia   DM type 2 causing neurological disease   Unstable angina   S/P CABG x 6   Atrial fibrillation    Signed, Peter Martinique MD,FACC 08/29/2013 12:02 PM

## 2013-08-29 NOTE — Progress Notes (Signed)
Pt transferred to 2w37 from 2w25; pt up ambulating in hallway with RN at this time; will cont. To monitor.

## 2013-08-29 NOTE — Progress Notes (Signed)
Pt not wanting to ambulate at this time; multiple family members at bedside; will attempt at a later time; will cont. To monitor.

## 2013-08-30 LAB — PROTIME-INR
INR: 1.38 (ref 0.00–1.49)
Prothrombin Time: 16.6 seconds — ABNORMAL HIGH (ref 11.6–15.2)

## 2013-08-30 LAB — GLUCOSE, CAPILLARY: Glucose-Capillary: 122 mg/dL — ABNORMAL HIGH (ref 70–99)

## 2013-08-30 MED ORDER — POTASSIUM CHLORIDE ER 10 MEQ PO TBCR: 10.0000 meq | EXTENDED_RELEASE_TABLET | Freq: Every day | ORAL | Status: DC

## 2013-08-30 MED ORDER — AMIODARONE HCL 200 MG PO TABS
200.0000 mg | ORAL_TABLET | Freq: Two times a day (BID) | ORAL | Status: DC
Start: 2013-08-30 — End: 2013-09-23

## 2013-08-30 MED ORDER — METOPROLOL TARTRATE 25 MG PO TABS: 37.5000 mg | ORAL_TABLET | Freq: Two times a day (BID) | ORAL | Status: DC

## 2013-08-30 MED ORDER — METFORMIN HCL 850 MG PO TABS: 850.0000 mg | ORAL_TABLET | Freq: Two times a day (BID) | ORAL | Status: DC

## 2013-08-30 MED ORDER — FUROSEMIDE 20 MG PO TABS: 20.0000 mg | ORAL_TABLET | Freq: Every day | ORAL | Status: DC

## 2013-08-30 MED ORDER — ASPIRIN EC 81 MG PO TBEC: 81.0000 mg | DELAYED_RELEASE_TABLET | Freq: Every day | ORAL | Status: DC

## 2013-08-30 MED ORDER — OXYCODONE HCL 5 MG PO TABS: 5.0000 mg | ORAL_TABLET | ORAL | Status: DC | PRN

## 2013-08-30 MED ORDER — LOSARTAN POTASSIUM 50 MG PO TABS: 50.0000 mg | ORAL_TABLET | Freq: Every day | ORAL | Status: DC

## 2013-08-30 MED ORDER — TRAMADOL HCL 50 MG PO TABS: 50.0000 mg | ORAL_TABLET | ORAL | Status: DC | PRN

## 2013-08-30 NOTE — Progress Notes (Addendum)
      [address]       [place] [address]             [phone]      7 Days Post-Op Procedure(s) (LRB): CORONARY ARTERY BYPASS GRAFTING (CABG) (N/A) INTRAOPERATIVE TRANSESOPHAGEAL ECHOCARDIOGRAM (N/A) ENDOVEIN HARVEST OF GREATER SAPHENOUS VEIN (Right)  Subjective:  No complaints this morning.  Ready to go home.  Ambulating independently.  + BM  Objective: Vital signs in last 24 hours: Temp:  [99.1 F (37.3 C)-99.6 F (37.6 C)] 99.5 F (37.5 C) (01/23 0507) Pulse Rate:  [70-84] 72 (01/23 0507) Cardiac Rhythm:  [-] Normal sinus rhythm (01/22 2038) Resp:  [18] 18 (01/23 0507) BP: (113-137)/(55-72) 131/72 mmHg (01/23 0507) SpO2:  [92 %-95 %] 92 % (01/23 0507) Weight:  [175 lb 4.3 oz (79.5 kg)] 175 lb 4.3 oz (79.5 kg) (01/23 0507)  Intake/Output from previous day: 01/22 0701 - 01/23 0700 In: 600 [P.O.:600] Out: 552 [Urine:550; Stool:2]  General appearance: alert, cooperative and no distress Heart: regular rate and rhythm Lungs: clear to auscultation bilaterally Abdomen: soft, non-tender; bowel sounds normal; no masses,  no organomegaly Extremities: edema none appreciated Wound: clean and dry  Lab Results:  Recent Labs  08/28/13 0505  WBC 7.3  HGB 11.0*  HCT 31.8*  PLT 181   BMET:  Recent Labs  08/28/13 0505  NA 140  K 3.6*  CL 101  CO2 23  GLUCOSE 135*  BUN 22  CREATININE 0.88  CALCIUM 8.5    PT/INR:  Recent Labs  08/30/13 0326  LABPROT 16.6*  INR 1.38   ABG    Component Value Date/Time   PHART 7.337* 08/23/2013 2008   HCO3 23.0 08/23/2013 2008   TCO2 23 08/24/2013 1650   ACIDBASEDEF 2.0 08/23/2013 2008   O2SAT 91.0 08/23/2013 2008   CBG (last 3)   Recent Labs  08/29/13 1208 08/29/13 1703 08/29/13 2054  GLUCAP 133* 145* 135*    Assessment/Plan: S/P Procedure(s) (LRB): CORONARY ARTERY BYPASS GRAFTING (CABG) (N/A) INTRAOPERATIVE TRANSESOPHAGEAL ECHOCARDIOGRAM (N/A) ENDOVEIN HARVEST OF GREATER SAPHENOUS VEIN  (Right)  1. CV- No further A.Fib, currently NSR- will continue Amiodarone, Lopressor 2. Pulm- no acute issues, good use IS encouraged to continue at discharge 3. Renal- weight remains stable, on Lasix will decrease dose and discontinue in 5 days 4. DM- CBGs controlled, continue home regimen 5. Dispo- patient stable, looks great, no further A. Fib, will d/c home today   LOS: 9 days    [name], [name] 08/30/2013  Holding sinus, home today I have seen and examined [name] [name] and agree with the above assessment  and plan.  Grace Isaac MD Beeper [phone] Office [phone] 08/30/2013 8:42 AM

## 2013-08-30 NOTE — Progress Notes (Signed)
TELEMETRY: Reviewed telemetry pt in NSR.: Filed Vitals:   08/29/13 0921 08/29/13 1400 08/29/13 1951 08/30/13 0507  BP: 137/67 113/68 132/55 131/72  Pulse: 84 70 73 72  Temp:  99.1 F (37.3 C) 99.6 F (37.6 C) 99.5 F (37.5 C)  TempSrc:  Oral Oral Oral  Resp:  18 18 18   Height:      Weight:    175 lb 4.3 oz (79.5 kg)  SpO2:  93% 95% 92%    Intake/Output Summary (Last 24 hours) at 08/30/13 0933 Last data filed at 08/30/13 0744  Gross per 24 hour  Intake    720 ml  Output    552 ml  Net    168 ml    SUBJECTIVE Feels good today. No chest pain or SOB.  LABS: Basic Metabolic Panel:  Recent Labs  08/28/13 0505  NA 140  K 3.6*  CL 101  CO2 23  GLUCOSE 135*  BUN 22  CREATININE 0.88  CALCIUM 8.5   Liver Function Tests: No results found for this basename: AST, ALT, ALKPHOS, BILITOT, PROT, ALBUMIN,  in the last 72 hours No results found for this basename: LIPASE, AMYLASE,  in the last 72 hours CBC:  Recent Labs  08/28/13 0505  WBC 7.3  HGB 11.0*  HCT 31.8*  MCV 93.8  PLT 181   Cardiac Enzymes: No results found for this basename: CKTOTAL, CKMB, CKMBINDEX, TROPONINI,  in the last 72 hours Thyroid Function Tests:  Recent Labs  08/28/13 0505  TSH 2.553   Radiology/Studies:  Dg Chest 2 View  08/27/2013   CLINICAL DATA:  Follow up CABG  EXAM: CHEST  2 VIEW  COMPARISON:  08/26/2013; 08/22/2013; 08/25/2013 07/29/2009  FINDINGS: Grossly unchanged borderline enlarged cardiac silhouette and mediastinal contours post median sternotomy and CABG. Grossly unchanged small bilateral effusions with associated bibasilar opacities. No new focal airspace opacities. No evidence of edema. No pneumothorax. Grossly unchanged bones.  IMPRESSION: 1. Grossly unchanged small bilateral effusions with associated bibasilar opacities, likely atelectasis. 2. No definite evidence of edema.   Electronically Signed   By: Sandi Mariscal M.D.   On: 08/27/2013 07:50     PHYSICAL EXAM General:  Well developed, well nourished, in no acute distress. Head: Normocephalic, atraumatic, sclera non-icteric, no xanthomas, nares are without discharge. Neck: Negative for carotid bruits. JVD not elevated. Lungs: Clear bilaterally to auscultation without wheezes, rales, or rhonchi. Breathing is unlabored. Heart: RRR S1 S2 without murmurs, rubs, or gallops. Sternal incision is healing well. Abdomen: Soft, non-tender, non-distended with normoactive bowel sounds. No hepatomegaly. No rebound/guarding. No obvious abdominal masses. Msk:  Strength and tone appears normal for age. Extremities: No clubbing, cyanosis or edema.  Distal pedal pulses are 2+ and equal bilaterally. Neuro: Alert and oriented X 3. Moves all extremities spontaneously. Psych:  Responds to questions appropriately with a normal affect.  ASSESSMENT AND PLAN: 1. 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 LV function is good.  2. Post operative atrial fibrillation. Converted to NSR. On amiodarone and lopressor.  CHAD-Vasc score of 3. 3. HTN 4. DM type 2 5. Hyperlipidemia  Plan: Agree with Amiodarone and metoprolol. Stable for DC today. Will stop coumadin and use ASA only. Will arrange follow up in 2-3 weeks.  Active Problems:   Hypercholesteremia   DM type 2 causing neurological disease   Unstable angina   S/P CABG x 6   Atrial fibrillation    Signed, Aylana Hirschfeld [place] MD,FACC 08/30/2013 9:33 AM

## 2013-08-30 NOTE — Discharge Instructions (Signed)
Coronary Artery Bypass Grafting, Care After °Refer to this sheet in the next few weeks. These instructions provide you with information on caring for yourself after your procedure. Your health care provider may also give you more specific instructions. Your treatment has been planned according to current medical practices, but problems sometimes occur. Call your health care provider if you have any problems or questions after your procedure. °WHAT TO EXPECT AFTER THE PROCEDURE °Recovery from surgery will be different for everyone. Some people feel well after 3 or 4 weeks, while for others it takes longer. After your procedure, it is typical to have the following: °· Nausea and a lack of appetite.   °· Constipation. °· Weakness and fatigue.   °· Depression or irritability.   °· Pain or discomfort at your incision site. °HOME CARE INSTRUCTIONS °· Only take over-the-counter or prescription medicines as directed by your health care provider. Take all medicines exactly as directed. Do not stop taking medicines or start any new medicines without first checking with your health care provider.   °· Take your pulse as directed by your health care provider. °· Perform deep breathing as directed by your health care provider. If you were given a device called an incentive spirometer, use it to practice deep breathing several times a day. Support your chest with a pillow or your arms when you take deep breaths or cough. °· Keep incision areas clean, dry, and protected. Remove or change any bandages (dressings) only as directed by your health care provider. You may have skin adhesive strips over the incision areas. Do not take the strips off. They will fall off on their own. °· Check incision areas daily for any swelling, redness, or drainage. °· If incisions were made in your legs, do the following: °· Avoid crossing your legs.   °· Avoid sitting for long periods of time. Change positions every 30 minutes.   °· Elevate your legs  when you are sitting.   °· Wear compression stockings as directed by your health care provider. These stockings help keep blood clots from forming in your legs. °· Take showers once your health care provider approves. Until then, only take sponge baths. Pat incisions dry. Do not rub incisions with a washcloth or towel. Do not take tub baths or go swimming until your health care provider approves. °· Eat foods that are high in fiber, such as raw fruits and vegetables, whole grains, beans, and nuts. Meats should be lean cut. Avoid canned, processed, and fried foods. °· Drink enough fluids to keep your urine clear or pale yellow. °· Weigh yourself every day. This helps identify if you are retaining fluid that may make your heart and lungs work harder.   °· Rest and limit activity as directed by your health care provider. You may be instructed to: °· Stop any activity at once if you have chest pain, shortness of breath, irregular heartbeats, or dizziness. Get help right away if you have any of these symptoms. °· Move around frequently for short periods or take short walks as directed by your health care provider. Increase your activities gradually. You may need physical therapy or cardiac rehabilitation to help strengthen your muscles and build your endurance. °· Avoid lifting, pushing, or pulling anything heavier than 10 lb (4.5 kg) for at least 6 weeks after surgery. °· Do not drive until your health care provider approves.  °· Ask your health care provider when you may return to work and resume sexual activity. °· Follow up with your health care provider as   directed.   °SEEK MEDICAL CARE IF: °· You have swelling, redness, increasing pain, or drainage at the site of an incision.   °· You develop a fever.   °· You have swelling in your ankles or legs.   °· You have pain in your legs.   °· You have weight gain of 2 or more pounds a day. °· You are nauseous or vomit. °· You have diarrhea.  °SEEK IMMEDIATE MEDICAL CARE  IF: °· You have chest pain that goes to your jaw or arms. °· You have shortness of breath.   °· You have a fast or irregular heartbeat.   °· You notice a "clicking" in your breastbone (sternum) when you move.   °· You have numbness or weakness in your arms or legs. °· You feel dizzy or lightheaded.   °MAKE SURE YOU: °· Understand these instructions. °· Will watch your condition. °· Will get help right away if you are not doing well or get worse. °Document Released: 02/11/2005 Document Revised: 03/27/2013 Document Reviewed: 01/01/2013 °ExitCare® Patient Information ©2014 [employer]. ° °Endoscopic Saphenous Vein Harvesting °Care After °Refer to this sheet in the next few weeks. These instructions provide you with information on caring for yourself after your procedure. Your caregiver may also give you more specific instructions. Your treatment has been planned according to current medical practices, but problems sometimes occur. Call your caregiver if you have any problems or questions after your procedure. °HOME CARE INSTRUCTIONS °Medicine °· Take whatever pain medicine your surgeon prescribes. Follow the directions carefully. Do not take over-the-counter pain medicine unless your surgeon says it is okay. Some pain medicine can cause bleeding problems for several weeks after surgery. °· Follow your surgeon's instructions about driving. You will probably not be permitted to drive after heart surgery. °· Take any medicines your surgeon prescribes. Any medicines you took before your heart surgery should be checked with your caregiver before you start taking them again. °Wound care °· Ask your surgeon how long you should keep wearing your elastic bandage or stocking. °· Check the area around your surgical cuts (incisions) whenever your bandages (dressings) are changed. Look for any redness or swelling. °· You will need to return to have the stitches (sutures) or staples taken out. Ask your surgeon when to do  that. °· Ask your surgeon when you can shower or bathe. °Activity °· Try to keep your legs raised when you are sitting. °· Do any exercises your caregivers have given you. These may include deep breathing exercises, coughing, walking, or other exercises. °SEEK MEDICAL CARE IF: °· You have any questions about your medicines. °· You have more leg pain, especially if your pain medicine stops working. °· New or growing bruises develop on your leg. °· Your leg swells, feels tight, or becomes red. °· You have numbness in your leg. °SEEK IMMEDIATE MEDICAL CARE IF: °· Your pain gets much worse. °· Blood or fluid leaks from any of the incisions. °· Your incisions become warm, swollen, or red. °· You have chest pain. °· You have trouble breathing. °· You have a fever. °· You have more pain near your leg incision. °MAKE SURE YOU: °· Understand these instructions. °· Will watch your condition. °· Will get help right away if you are not doing well or get worse. °Document Released: 04/06/2011 Document Revised: 10/17/2011 Document Reviewed: 04/06/2011 °ExitCare® Patient Information ©2014 [employer]. ° ° °

## 2013-08-30 NOTE — Progress Notes (Signed)
IV and tele removed, pt given discharge instructions, follow up appointments, prescriptions, and education, pt stated understanding. Rickard Rhymes, RN

## 2013-08-30 NOTE — Progress Notes (Signed)
Ed completed with pt and wife. Voiced understanding and requests his name be sent to [facility]. [phone] Yves Dill CES, ACSM 10:38 AM 08/30/2013

## 2013-08-30 NOTE — Progress Notes (Signed)
Sutures removed from abdomen, pt tolerated well, site cleaned with benzoin and 1/2 inch steri strips applied, will continue to monitor pt, pt resting in bed Rickard Rhymes, RN

## 2013-09-04 ENCOUNTER — Telehealth: Payer: Self-pay | Admitting: Family Medicine

## 2013-09-04 NOTE — Telephone Encounter (Signed)
Called and talked to patient.  He feels good, given the recent surgery.  He asked about the DM2 class at the pharmacy.  Last A1c was 6.6.  Would push that back a little now, to allow for more healing esp with recent flu in the area.  He agrees.

## 2013-09-19 ENCOUNTER — Encounter: Payer: Medicare Other | Admitting: Cardiothoracic Surgery

## 2013-09-19 ENCOUNTER — Other Ambulatory Visit: Payer: Self-pay | Admitting: *Deleted

## 2013-09-19 DIAGNOSIS — I2510 Atherosclerotic heart disease of native coronary artery without angina pectoris: Secondary | ICD-10-CM

## 2013-09-23 ENCOUNTER — Ambulatory Visit
Admission: RE | Admit: 2013-09-23 | Discharge: 2013-09-23 | Disposition: A | Discharge status: Home / Self Care | Payer: Medicare Other | Source: Ambulatory Visit | Attending: Cardiothoracic Surgery | Admitting: Cardiothoracic Surgery

## 2013-09-23 ENCOUNTER — Ambulatory Visit (INDEPENDENT_AMBULATORY_CARE_PROVIDER_SITE_OTHER): Payer: Medicare Other | Admitting: Surgical

## 2013-09-23 VITALS — BP 150/78 | HR 54 | Resp 20 | Ht 67.0 in | Wt 162.0 lb

## 2013-09-23 DIAGNOSIS — I2510 Atherosclerotic heart disease of native coronary artery without angina pectoris: Secondary | ICD-10-CM

## 2013-09-23 DIAGNOSIS — Z951 Presence of aortocoronary bypass graft: Secondary | ICD-10-CM

## 2013-09-23 NOTE — Patient Instructions (Signed)
The patient may return to driving an automobile as long as they are no longer requiring oral narcotic pain relievers during the daytime.  It would be wise to start driving only short distances during the daylight and gradually increase from there as they feel comfortable.The patient should continue to avoid any heavy lifting or strenuous use of arms or shoulders for at least a total of three months from the time of surgery.

## 2013-09-23 NOTE — Progress Notes (Signed)
[name] [name]       [place] [address]             [phone]                  [name] [name] Medical Record #[id number] Date of Birth: Nov 14, 1940  Referring XB:[id number], [place] Primary Cardiology: Primary Care:[name] Damita Dunnings, MD  Chief Complaint:   PostOp Follow Up Visit   History of Present Illness:  73 year old male status post coronary artery bypass grafting x6 on 08/23/2013 by Dr. Servando Snare for new onset angina and severe three-vessel coronary artery disease. The patient is doing very well. He denies chest pain or shortness of breath. He denies fever, chills or other constitutional symptoms. 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           History  Smoking status  . Never Smoker   Smokeless tobacco  . Never Used       Allergies  Allergen Reactions  . Aspirin     bleeding  . Statins     Muscle pain    Current Outpatient Prescriptions  Medication Sig Dispense Refill  . acyclovir (ZOVIRAX) 400 MG tablet Take 400 mg by mouth 2 (two) times daily.      Marland Kitchen amiodarone (PACERONE) 200 MG tablet Take 200 mg by mouth daily.      Marland Kitchen aspirin EC 81 MG tablet Take 1 tablet (81 mg total) by mouth daily.      . ciprofloxacin (CIPRO) 250 MG tablet Take 250 mg by mouth 2 (two) times daily.      . diclofenac (VOLTAREN) 75 MG EC tablet Take 75 mg by mouth 2 (two) times daily.      . fenofibrate 160 MG tablet Take 160 mg by mouth at bedtime.      . gabapentin (NEURONTIN) 600 MG tablet Take 600 mg by mouth 2 (two) times daily.      Marland Kitchen losartan (COZAAR) 50 MG tablet Take 1 tablet (50 mg total) by mouth daily.  30 tablet  3  . metFORMIN (GLUCOPHAGE) 850 MG tablet Take 1 tablet (850 mg total) by mouth 2 (two) times daily with a meal.  60 tablet  3  . metoprolol tartrate (LOPRESSOR) 25 MG tablet Take 1.5 tablets (37.5 mg total) by mouth 2 (two) times daily.  90 tablet  3   No current facility-administered medications for this  visit.       Physical Exam: BP 150/78  Pulse 54  Resp 20  Ht 5\' 7"  (1.702 m)  Wt 162 lb (73.483 kg)  BMI 25.37 kg/m2  SpO2 96%  General appearance: alert, cooperative and no distress Heart: regular rate and rhythm and S1, S2 normal Lungs: clear to auscultation bilaterally Abdomen: Benign Extremities: No edema Wound: Incisions healing well without evidence of infection   Diagnostic Studies & Laboratory data:         Recent Radiology Findings: Dg Chest 2 View  09/23/2013   CLINICAL DATA:  CABG 1 month ago.  EXAM: CHEST  2 VIEW  COMPARISON:  08/27/2013  FINDINGS: The small bilateral pleural effusions and associated bibasilar atelectasis seen on the previous study have resolved in the interval. The The cardiopericardial silhouette is within normal limits for size. Patient is status post CABG. Imaged bony structures of the thorax are intact.  IMPRESSION: Interval resolution of basilar atelectasis and small bilateral pleural effusions. No acute cardiopulmonary findings.  Electronically Signed   By: Misty Stanley M.D.   On: 09/23/2013 12:32      Recent Labs: Lab Results  Component Value Date   WBC 7.3 08/28/2013   HGB 11.0* 08/28/2013   HCT 31.8* 08/28/2013   PLT 181 08/28/2013   GLUCOSE 135* 08/28/2013   CHOL 147 07/08/2013   TRIG 217.0* 07/08/2013   HDL 30.30* 07/08/2013   LDLDIRECT 79.8 07/08/2013   LDLCALC 73 06/04/2012   ALT 26 08/22/2013   AST 24 08/22/2013   NA 140 08/28/2013   K 3.6* 08/28/2013   CL 101 08/28/2013   CREATININE 0.88 08/28/2013   BUN 22 08/28/2013   CO2 23 08/28/2013   TSH 2.553 08/28/2013   INR 1.38 08/30/2013   HGBA1C 6.6* 08/22/2013      Assessment / Plan:  The patient is doing quite well. We discussed ongoing activity restrictions as well as driving. He is agreeable to our routine protocol. He is to start cardiac rehabilitation at [facility]. We will assist him with arranging this. He will followup with [name] [name] with cardiology. We will see him  again when necessary for any surgically related issues or at request.          [name] [name],[name] [name] 09/23/2013 1:07 PM

## 2013-09-25 ENCOUNTER — Ambulatory Visit: Payer: Medicare Other | Admitting: Cardiovascular Disease

## 2013-09-26 ENCOUNTER — Telehealth: Payer: Self-pay

## 2013-09-26 NOTE — Telephone Encounter (Signed)
order faxed to [facility]

## 2013-10-07 ENCOUNTER — Encounter: Payer: Self-pay | Admitting: Cardiothoracic Surgery

## 2013-10-15 ENCOUNTER — Other Ambulatory Visit: Payer: Self-pay

## 2013-10-18 DIAGNOSIS — M722 Plantar fascial fibromatosis: Secondary | ICD-10-CM

## 2013-11-05 ENCOUNTER — Encounter: Payer: Self-pay | Admitting: Cardiovascular Disease

## 2013-11-05 ENCOUNTER — Ambulatory Visit (INDEPENDENT_AMBULATORY_CARE_PROVIDER_SITE_OTHER): Payer: Medicare Other | Admitting: Cardiovascular Disease

## 2013-11-05 VITALS — BP 159/63 | HR 37 | Ht 67.0 in | Wt 169.0 lb

## 2013-11-05 DIAGNOSIS — I10 Essential (primary) hypertension: Secondary | ICD-10-CM

## 2013-11-05 DIAGNOSIS — I2510 Atherosclerotic heart disease of native coronary artery without angina pectoris: Secondary | ICD-10-CM

## 2013-11-05 DIAGNOSIS — I4891 Unspecified atrial fibrillation: Secondary | ICD-10-CM

## 2013-11-05 MED ORDER — METOPROLOL TARTRATE 25 MG PO TABS: 12.5000 mg | ORAL_TABLET | Freq: Two times a day (BID) | ORAL | Status: DC

## 2013-11-05 NOTE — Patient Instructions (Addendum)
Your physician recommends that you schedule a follow-up appointment in:  3 months. --Scheduled for January 28, 2014 at 4:00  Your physician has recommended you make the following change in your medication: Stop amiodarone.  Decrease metoprolol tartrate to 12. 5 mg by mouth twice daily

## 2013-11-05 NOTE — Progress Notes (Signed)
History of Present Illness: 73 yo male with history of HTN, HLD, DM who is here today for cardiac follow up. I saw him January 2015 for a second opinion on his chest pain and abnormal stress test. He was seen by Dr. Acie Fredrickson 2 years ago for chest pain and underwent evaluation with a treadmill nuclear stress test as well as an echocardiogram. Both of them were unremarkable. Over the 3-4 months before his visit with me, he had been experiencing exertional substernal chest tightness and burning sensation. The pain occured when he was on the treadmill, starting in the left chest wall and radiating to his left shoulder and arm. He was seen by Dr. Fletcher Anon in our [facility] office 07/19/13 and had an exercise treadmill stress test with ST depression and chest pain. Cardiac cath was recommended but he wanted to think about this. I met him in January 2015 and arranged a cardiac cath on 08/21/13 and he was found to have severe three vessel CAD. He underwent 6V CABG 08/23/13 per Dr. Servando Snare. He has done well since then. He is in cardiac rehab at [facility].   No chest pain or SOB. Feeling well.   Primary Care Physician: [name]  Last Lipid Profile:Lipid Panel     Component Value Date/Time   CHOL 147 07/08/2013 0906   TRIG 217.0* 07/08/2013 0906   HDL 30.30* 07/08/2013 0906   CHOLHDL 5 07/08/2013 0906   VLDL 43.4* 07/08/2013 0906   LDLCALC 73 06/04/2012 0847     Past Medical History  Diagnosis Date  . Diabetes mellitus   . Hypertension   . Chest pain   . Flank pain   . Diabetic neuropathy   . Anxiety   . Hypercholesterolemia   . History of bronchitis   . History of chicken pox   . Heart murmur   . Kidney stones   . 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 Diverticulosis   . Osteoarthritis   . Fatty liver   . Diverticulosis   . Family history of anesthesia complication     ' they cant wake my brother very easy"  . Coronary artery disease 08/21/2013    severe triple vessel  .  Anginal pain   . GERD (gastroesophageal reflux disease)     Past Surgical History  Procedure Laterality Date  . Hernia repair      X3  . Cholecystectomy  1983  . Cardiac catheterization  08/21/2013    DR Angelena Form  . Coronary artery bypass graft N/A 08/23/2013    Procedure: CORONARY ARTERY BYPASS GRAFTING (CABG);  Surgeon: Grace Isaac, MD;  Location: [place];  Service: Open Heart Surgery;  Laterality: N/A;  CABG times six utilizing the left internal mammary artery and the right greater saphenous vein harvested endoscopically  . Intraoperative transesophageal echocardiogram N/A 08/23/2013    Procedure: INTRAOPERATIVE TRANSESOPHAGEAL ECHOCARDIOGRAM;  Surgeon: Grace Isaac, MD;  Location: [place];  Service: Open Heart Surgery;  Laterality: N/A;  . Endovein harvest of greater saphenous vein Right 08/23/2013    Procedure: ENDOVEIN HARVEST OF GREATER SAPHENOUS VEIN;  Surgeon: Grace Isaac, MD;  Location: [place];  Service: Open Heart Surgery;  Laterality: Right;    Current Outpatient Prescriptions  Medication Sig Dispense Refill  . acyclovir (ZOVIRAX) 400 MG tablet Take 400 mg by mouth 2 (two) times daily.      Marland Kitchen amiodarone (PACERONE) 200 MG tablet Take 200 mg by mouth  daily.      . aspirin EC 81 MG tablet Take 1 tablet (81 mg total) by mouth daily.      . ciprofloxacin (CIPRO) 250 MG tablet Take 250 mg by mouth 2 (two) times daily.      . diclofenac (VOLTAREN) 75 MG EC tablet Take 75 mg by mouth 2 (two) times daily.      . fenofibrate 160 MG tablet Take 160 mg by mouth at bedtime.      . gabapentin (NEURONTIN) 600 MG tablet Take 600 mg by mouth 2 (two) times daily.      Marland Kitchen losartan (COZAAR) 50 MG tablet Take 1 tablet (50 mg total) by mouth daily.  30 tablet  3  . metFORMIN (GLUCOPHAGE) 850 MG tablet Take 1 tablet (850 mg total) by mouth 2 (two) times daily with a meal.  60 tablet  3  . metoprolol tartrate (LOPRESSOR) 25 MG tablet Take 1.5 tablets (37.5 mg total) by mouth 2 (two) times  daily.  90 tablet  3   No current facility-administered medications for this visit.    Allergies  Allergen Reactions  . Aspirin     bleeding  . Statins     Muscle pain    History   Social History  . Marital Status: Married    Spouse Name: N/A    Number of Children: N/A  . Years of Education: N/A   Occupational History  . Retired    Social History Main Topics  . Smoking status: Never Smoker   . Smokeless tobacco: Never Used  . Alcohol Use: No  . Drug Use: No  . Sexual Activity: Not on file   Other Topics Concern  . 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 Stroke Mother   . Heart disease Father     heart failure  . Parkinsonism Maternal Grandfather   . Colon cancer Maternal Grandmother   . Prostate cancer Maternal Uncle   . Stroke Paternal Grandfather   . CAD Brother     CABG    Review of Systems:  As stated in the HPI and otherwise negative.   BP 159/63  Pulse 37  Ht 5' 7" (1.702 m)  Wt 169 lb (76.658 kg)  BMI 26.46 kg/m2  Physical Examination: General: Well developed, well nourished, NAD HEENT: OP clear, mucus membranes moist SKIN: warm, dry. No rashes. Neuro: No focal deficits Musculoskeletal: Muscle strength 5/5 all ext Psychiatric: Mood and affect normal Neck: No JVD, no carotid bruits, no thyromegaly, no lymphadenopathy. Lungs:Clear bilaterally, no wheezes, rhonci, crackles Cardiovascular: Regular rate and rhythm. Systolic murmur. No gallops or rubs. Abdomen:Soft. Bowel sounds present. Non-tender.  Extremities: No lower extremity edema. Pulses are 2 + in the bilateral DP/PT.  Cardiac cath 08/21/13: Left main: No obstructive disease.  Left Anterior Descending Artery: Large caliber vessel that courses to the apex and gives off a moderate  caliber diagonal branch. The mid LAD has 50% stenosis followed by a focal 70-80% stenosis. The distal vessel has diffuse disease with serial 90% stenoses just before the vessel wraps around the apex. The diagonal branch is moderate in caliber with 99% stenosis in the mid vessel just before a bifurcation.  Circumflex Artery: Large caliber vessel with 99%  proximal stenosis. The mid vessel has diffuse 40% stenosis. There is a moderate to large caliber obtuse marginal branch that arises distally with 80% proximal stenosis. The AV groove Circumflex beyond the takeoff of the OM branch has 99% stenosis.  Right Coronary Artery: Large caliber, dominant vessel with 100% proximal occlusion after a large conus branch that supplies a portion of the RV wall. The proximal, mid and distal vessel fills from left to right collaterals.  Left Ventricular Angiogram: LVEF=60%.   EKG: sinus brady, rate 37 bpm. 1st degree av block. LAFB.   Assessment and Plan:   1. Chest pain: He has risk factors for CAD including DM, HTN and HLD. Stress test is c/w ischemia. He is now agreeable to cardiac cath which is indicated. 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 Will arrange cardiac cath with possible PCI next week. Pre-cath labs today. Risks and benefits reviewed with pt.   2. HTN: BP has been controlled at home. Slightly elevated today. No changes.   3. Post-op atrial fib: Sinus today. He is bradycardic. No palpitations. No indication for long term anti-coagulation. Will stop amiodarone with bradycardia. Lower metoprolol to 12.5 mg po BID.

## 2013-12-18 ENCOUNTER — Other Ambulatory Visit: Payer: Self-pay | Admitting: Family Medicine

## 2013-12-18 MED ORDER — GABAPENTIN 600 MG PO TABS: 600.0000 mg | ORAL_TABLET | Freq: Three times a day (TID) | ORAL | Status: DC

## 2013-12-18 NOTE — Progress Notes (Signed)
Pt dropped off a note to have gabapentin dose corrected in EMR to TID.  Done.

## 2013-12-22 ENCOUNTER — Other Ambulatory Visit: Payer: Self-pay | Admitting: Family Medicine

## 2013-12-28 ENCOUNTER — Other Ambulatory Visit: Payer: Self-pay | Admitting: Physician Assistant

## 2013-12-31 ENCOUNTER — Other Ambulatory Visit: Payer: Self-pay | Admitting: Cardiovascular Disease

## 2014-01-09 ENCOUNTER — Telehealth: Payer: Self-pay | Admitting: Cardiology

## 2014-01-09 NOTE — Telephone Encounter (Signed)
Called due to onset of heart "skipping and fluttering". History reviewed. Underwent CABG 08/2013 and had difficulties with peri-op afib. Amiodarone and BB. At f/u with Dr. Julianne Handler 10/2013, stopped amio and reduced BB.

## 2014-01-09 NOTE — Telephone Encounter (Signed)
Noted continued:  Spoke with pt. Feels back to normal now. Previously felt flushed and in taking his pulse, it felt like it was an extra beat every once in awhile. No red flag symptoms (syncope, presyncope, chest pain). Reviewed indications to seek emergent care. Continue home meds including beta blocker. Otherwise, may need home monitoring in the future if symptoms recur.  FYI to Dr. Julianne Handler.

## 2014-01-10 NOTE — Telephone Encounter (Signed)
Would you mind calling him and see how he is feeling? [name] [name]

## 2014-01-10 NOTE — Telephone Encounter (Signed)
Ok. Thanks!

## 2014-01-10 NOTE — Telephone Encounter (Signed)
Spoke with pt who reports he is feeling good this AM. He has checked heart rate several times and it is regular.  He has upcoming appt with Dr. Angelena Form on June 23,2015

## 2014-01-24 ENCOUNTER — Other Ambulatory Visit: Payer: Self-pay | Admitting: Family Medicine

## 2014-01-28 ENCOUNTER — Ambulatory Visit: Payer: Medicare Other | Admitting: Cardiovascular Disease

## 2014-01-28 ENCOUNTER — Encounter: Payer: Self-pay | Admitting: Cardiovascular Disease

## 2014-01-28 ENCOUNTER — Ambulatory Visit (INDEPENDENT_AMBULATORY_CARE_PROVIDER_SITE_OTHER): Payer: Medicare Other | Admitting: Cardiovascular Disease

## 2014-01-28 VITALS — BP 140/60 | HR 60 | Ht 67.0 in | Wt 173.0 lb

## 2014-01-28 DIAGNOSIS — I480 Paroxysmal atrial fibrillation: Secondary | ICD-10-CM

## 2014-01-28 DIAGNOSIS — I10 Essential (primary) hypertension: Secondary | ICD-10-CM

## 2014-01-28 DIAGNOSIS — I4891 Unspecified atrial fibrillation: Secondary | ICD-10-CM

## 2014-01-28 DIAGNOSIS — I2510 Atherosclerotic heart disease of native coronary artery without angina pectoris: Secondary | ICD-10-CM

## 2014-01-28 NOTE — Progress Notes (Signed)
History of Present Illness: 73 yo male with history of CAD s/p CABG, HTN, HLD, DM who is here today for cardiac follow up. I saw him January 2015 for a second opinion on his chest pain and abnormal stress test. He was seen by Dr. Acie Fredrickson 2 years ago for chest pain and underwent evaluation with a treadmill nuclear stress test as well as an echocardiogram. Both of them were unremarkable. Over the 3-4 months before his visit with me, he had been experiencing exertional substernal chest tightness and burning sensation. The pain occured when he was on the treadmill, starting in the left chest wall and radiating to his left shoulder and arm. He was seen by Dr. Fletcher Anon in our [facility] office 07/19/13 and had an exercise treadmill stress test with ST depression and chest pain. Cardiac cath was recommended but he wanted to think about this. I met him in January 2015 and arranged a cardiac cath on 08/21/13 and he was found to have severe three vessel CAD. He underwent 6V CABG 08/23/13 per Dr. Servando Snare. He has done well since then.    He is here today for follow up. He has been having pain over his sternum with movement. This is clearly worsened with movement. No exertional chest pain. No SOB.  Primary Care Physician: [name]  Last Lipid Profile:Lipid Panel     Component Value Date/Time   CHOL 147 07/08/2013 0906   TRIG 217.0* 07/08/2013 0906   HDL 30.30* 07/08/2013 0906   CHOLHDL 5 07/08/2013 0906   VLDL 43.4* 07/08/2013 0906   LDLCALC 73 06/04/2012 0847     Past Medical History  Diagnosis Date  . Diabetes mellitus   . Hypertension   . Chest pain   . Flank pain   . Diabetic neuropathy   . Anxiety   . Hypercholesterolemia   . History of bronchitis   . History of chicken pox   . Heart murmur   . Kidney stones   . 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 Diverticulosis   . Osteoarthritis   . Fatty liver   . Diverticulosis   . Family history of anesthesia complication    ' they cant wake my brother very easy"  . Coronary artery disease 08/21/2013    severe triple vessel  . Anginal pain   . GERD (gastroesophageal reflux disease)     Past Surgical History  Procedure Laterality Date  . Hernia repair      X3  . Cholecystectomy  1983  . Cardiac catheterization  08/21/2013    DR Angelena Form  . Coronary artery bypass graft N/A 08/23/2013    Procedure: CORONARY ARTERY BYPASS GRAFTING (CABG);  Surgeon: Grace Isaac, MD;  Location: [facility];  Service: Open Heart Surgery;  Laterality: N/A;  CABG times six utilizing the left internal mammary artery and the right greater saphenous vein harvested endoscopically  . Intraoperative transesophageal echocardiogram N/A 08/23/2013    Procedure: INTRAOPERATIVE TRANSESOPHAGEAL ECHOCARDIOGRAM;  Surgeon: Grace Isaac, MD;  Location: [place];  Service: Open Heart Surgery;  Laterality: N/A;  . Endovein harvest of greater saphenous vein Right 08/23/2013    Procedure: ENDOVEIN HARVEST OF GREATER SAPHENOUS VEIN;  Surgeon: Grace Isaac, MD;  Location: [facility];  Service: Open Heart Surgery;  Laterality: Right;    Current Outpatient Prescriptions  Medication Sig Dispense Refill  . acyclovir (ZOVIRAX) 400 MG tablet Take 400 mg by mouth 2 (two) times daily.      Marland Kitchen  aspirin EC 81 MG tablet Take 1 tablet (81 mg total) by mouth daily.      . diclofenac (VOLTAREN) 75 MG EC tablet Take 75 mg by mouth 2 (two) times daily.      . fenofibrate 160 MG tablet Take 160 mg by mouth at bedtime.      . gabapentin (NEURONTIN) 600 MG tablet Take 1 tablet (600 mg total) by mouth 3 (three) times daily.      Marland Kitchen losartan (COZAAR) 50 MG tablet TAKE 1 TABLET BY MOUTH ONCE A DAY  30 tablet  0  . metFORMIN (GLUCOPHAGE) 850 MG tablet TAKE 1 TABLET BY MOUTH 2 TIMES A DAY WITH A MEAL  60 tablet  0  . metoprolol tartrate (LOPRESSOR) 25 MG tablet Take 0.5 tablets (12.5 mg total) by mouth 2 (two) times daily.  15 tablet  11   No current facility-administered  medications for this visit.    Allergies  Allergen Reactions  . Aspirin     bleeding  . Statins     Other reaction(s): Other (See Comments) Other Reaction: MYALGIA Muscle pain    History   Social History  . Marital Status: Married    Spouse Name: N/A    Number of Children: N/A  . Years of Education: N/A   Occupational History  . Retired    Social History Main Topics  . Smoking status: Never Smoker   . Smokeless tobacco: Never Used  . Alcohol Use: No  . Drug Use: No  . Sexual Activity: Not on file   Other Topics Concern  . 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 Stroke Mother   . Heart disease Father     heart failure  . Parkinsonism Maternal Grandfather   . Colon cancer Maternal Grandmother   . Prostate cancer Maternal Uncle   . Stroke Paternal Grandfather   . CAD Brother     CABG    Review of Systems:  As stated in the HPI and otherwise negative.   BP 140/60  Pulse 60  Ht _0  (1.702 m)  Wt 173 lb (78.472 kg)  BMI 27.09 kg/m2  Physical Examination: General: Well developed, well nourished, NAD HEENT: OP clear, mucus membranes moist SKIN: warm, dry. No rashes. Neuro: No focal deficits Musculoskeletal: Muscle strength 5/5 all ext Psychiatric: Mood and affect normal Neck: No JVD, no carotid bruits, no thyromegaly, no lymphadenopathy. Lungs:Clear bilaterally, no wheezes, rhonci, crackles Cardiovascular: Regular rate and rhythm. Systolic murmur. No gallops or rubs. Abdomen:Soft. Bowel sounds present. Non-tender.  Extremities: No lower extremity edema. Pulses are 2 + in the bilateral DP/PT.  Cardiac cath 08/21/13: Left main: No obstructive disease.  Left Anterior Descending Artery: Large caliber vessel that courses to the apex and gives off a  moderate caliber diagonal branch. The mid LAD has 50% stenosis followed by a focal 70-80% stenosis. The distal vessel has diffuse disease with serial 90% stenoses just before the vessel wraps around the apex. The diagonal branch is moderate in caliber with 99% stenosis in the mid vessel just before a bifurcation.  Circumflex Artery: Large caliber vessel with 99% proximal stenosis. The mid vessel has diffuse 40% stenosis. There is a moderate to large caliber obtuse marginal branch that  arises distally with 80% proximal stenosis. The AV groove Circumflex beyond the takeoff of the OM branch has 99% stenosis.  Right Coronary Artery: Large caliber, dominant vessel with 100% proximal occlusion after a large conus branch that supplies a portion of the RV wall. The proximal, mid and distal vessel fills from left to right collaterals.  Left Ventricular Angiogram: LVEF=60%.   EKG: Sinus, rate 60 bpm. 1st degree AV block.   Assessment and Plan:   1. CAD:  He is now s/p 6V CABG. Stable. Continue current medical therapy.   2. HTN: BP has been controlled at home. Slightly elevated today. No changes.   3. Post-op atrial fib: Sinus today. Rare skipped beats. No indication for long term anti-coagulation.

## 2014-01-28 NOTE — Patient Instructions (Signed)
Your physician wants you to follow-up in:  6 months. You will receive a reminder letter in the mail two months in advance. If you don't receive a letter, please call our office to schedule the follow-up appointment.   

## 2014-02-01 ENCOUNTER — Other Ambulatory Visit: Payer: Self-pay | Admitting: Physician Assistant

## 2014-02-03 ENCOUNTER — Other Ambulatory Visit: Payer: Self-pay | Admitting: Family Medicine

## 2014-02-03 NOTE — Telephone Encounter (Signed)
Electronic refill request.  Our meds list says Metoprolol 25 mg. Take 0.5 tablet by mouth two times daily.  Request says:  Take One and one half (37.5 mg) by mouth twice daily.  Please advise.

## 2014-02-03 NOTE — Telephone Encounter (Signed)
It was decreased to 0.5 tabs BID and filled back in 10/2013 for a year.

## 2014-02-09 ENCOUNTER — Other Ambulatory Visit: Payer: Self-pay | Admitting: Cardiovascular Disease

## 2014-03-22 ENCOUNTER — Other Ambulatory Visit: Payer: Self-pay | Admitting: Family Medicine

## 2014-03-24 NOTE — Telephone Encounter (Signed)
Electronic refill request. Last Filled:   60 tablet 0 RF on  01/27/14 with notation to schedule appt prior to further refills.  Pt had CPE in December of 2014.  Please advise.

## 2014-03-25 NOTE — Telephone Encounter (Signed)
Sent, schedule him.  Thanks.

## 2014-03-25 NOTE — Telephone Encounter (Signed)
Wife advised to ask him to schedule CPE.

## 2014-04-05 ENCOUNTER — Other Ambulatory Visit: Payer: Self-pay | Admitting: Family Medicine

## 2014-04-13 ENCOUNTER — Other Ambulatory Visit: Payer: Self-pay | Admitting: Family Medicine

## 2014-05-02 ENCOUNTER — Ambulatory Visit: Payer: Medicare Other | Admitting: Endocrinology

## 2014-06-02 ENCOUNTER — Ambulatory Visit (INDEPENDENT_AMBULATORY_CARE_PROVIDER_SITE_OTHER): Payer: Medicare Other | Admitting: Endocrinology

## 2014-06-02 ENCOUNTER — Encounter: Payer: Self-pay | Admitting: Endocrinology

## 2014-06-02 VITALS — BP 130/76 | HR 60 | Temp 97.9°F | Ht 67.0 in | Wt 173.0 lb

## 2014-06-02 DIAGNOSIS — E1149 Type 2 diabetes mellitus with other diabetic neurological complication: Secondary | ICD-10-CM

## 2014-06-02 DIAGNOSIS — M810 Age-related osteoporosis without current pathological fracture: Secondary | ICD-10-CM

## 2014-06-02 DIAGNOSIS — E1140 Type 2 diabetes mellitus with diabetic neuropathy, unspecified: Secondary | ICD-10-CM

## 2014-06-02 LAB — MICROALBUMIN / CREATININE URINE RATIO
CREATININE, U: 120.6 mg/dL
MICROALB UR: 1.3 mg/dL (ref 0.0–1.9)
Microalb Creat Ratio: 1.1 mg/g (ref 0.0–30.0)

## 2014-06-02 LAB — HEMOGLOBIN A1C: Hgb A1c MFr Bld: 6.6 % — ABNORMAL HIGH (ref 4.6–6.5)

## 2014-06-02 MED ORDER — GLUCOSE BLOOD VI STRP: 1.0000 | ORAL_STRIP | Freq: Every day | Status: DC

## 2014-06-02 NOTE — Patient Instructions (Addendum)
good diet and exercise habits significanly improve the control of your diabetes.  please let me know if you wish to be referred to a dietician.  high blood sugar is very risky to your health.  you should see an eye doctor and dentist every year.  It is very important to get all recommended vaccinations.  controlling your blood pressure and cholesterol drastically reduces the damage diabetes does to your body.  Those who smoke should quit.  please discuss these with your doctor.  check your blood sugar once a day.  vary the time of day when you check, between before the 3 meals, and at bedtime.  also check if you have symptoms of your blood sugar being too high or too low.  please keep a record of the readings and bring it to your next appointment here.  You can write it on any piece of paper.  please call us sooner if your blood sugar goes below 70, or if you have a lot of readings over 200. blood tests are being requested for you today.  We'll contact you with results.   Please come back for a follow-up appointment in 6 months.

## 2014-06-02 NOTE — Progress Notes (Signed)
Subjective:    Patient ID: [name] [name], male    DOB: June 06, 1941, 73 y.o.   MRN: [id number]  HPI pt says he is here today for DM.  Pt states DM was dx'ed in 2002; he has moderate neuropathy of the lower extremities; and associated CAD; he has never been on insulin; pt says his diet is poor, but exercise is good; he has never had pancreatitis, severe hypoglycemia or DKA.   Past Medical History  Diagnosis Date  . Diabetes mellitus   . Hypertension   . Chest pain   . Flank pain   . Diabetic neuropathy   . Anxiety   . Hypercholesterolemia   . History of bronchitis   . History of chicken pox   . Heart murmur   . Kidney stones   . 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 Diverticulosis   . Osteoarthritis   . Fatty liver   . Diverticulosis   . Family history of anesthesia complication     ' they cant wake my brother very easy"  . Coronary artery disease 08/21/2013    severe triple vessel  . Anginal pain   . GERD (gastroesophageal reflux disease)     Past Surgical History  Procedure Laterality Date  . Hernia repair      X3  . Cholecystectomy  1983  . Cardiac catheterization  08/21/2013    DR Angelena Form  . Coronary artery bypass graft N/A 08/23/2013    Procedure: CORONARY ARTERY BYPASS GRAFTING (CABG);  Surgeon: Grace Isaac, MD;  Location: [facility];  Service: Open Heart Surgery;  Laterality: N/A;  CABG times six utilizing the left internal mammary artery and the right greater saphenous vein harvested endoscopically  . Intraoperative transesophageal echocardiogram N/A 08/23/2013    Procedure: INTRAOPERATIVE TRANSESOPHAGEAL ECHOCARDIOGRAM;  Surgeon: Grace Isaac, MD;  Location: [facility];  Service: Open Heart Surgery;  Laterality: N/A;  . Endovein harvest of greater saphenous vein Right 08/23/2013    Procedure: ENDOVEIN HARVEST OF GREATER SAPHENOUS VEIN;  Surgeon: Grace Isaac, MD;  Location: [facility];  Service: Open Heart Surgery;  Laterality: Right;    History     Social History  . Marital Status: Married    Spouse Name: N/A    Number of Children: N/A  . Years of Education: N/A   Occupational History  . Retired    Social History Main Topics  . Smoking status: Never Smoker   . Smokeless tobacco: Never Used  . Alcohol Use: No  . Drug Use: No  . Sexual Activity: Not on file   Other Topics Concern  . 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 acyclovir (ZOVIRAX) 400 MG tablet Take 400 mg by mouth 2 (two) times daily.      Marland Kitchen aspirin EC 81 MG tablet Take 1 tablet (81 mg total) by mouth daily.      . diclofenac (VOLTAREN) 75 MG EC tablet Take 75 mg by mouth 2 (two) times daily.      . fenofibrate 160 MG tablet TAKE 1 TABLET BY MOUTH AT BEDTIME  90 tablet  3  . gabapentin (NEURONTIN) 600 MG tablet Take 1 tablet (  600 mg total) by mouth 3 (three) times daily.      Marland Kitchen losartan (COZAAR) 50 MG tablet TAKE 1 TABLET BY MOUTH ONCE A DAY  30 tablet  5  . metFORMIN (GLUCOPHAGE) 850 MG tablet TAKE 1 TABLET BY MOUTH 2 TIMES A DAY WITH A MEAL  60 tablet  3  . metoprolol tartrate (LOPRESSOR) 25 MG tablet Take 0.5 tablets (12.5 mg total) by mouth 2 (two) times daily.  15 tablet  11   No current facility-administered medications on file prior to visit.    Allergies  Allergen Reactions  . Aspirin     bleeding  . Statins     Other reaction(s): Other (See Comments) Other Reaction: MYALGIA Muscle pain    Family History  Problem Relation Age of Onset  . Stroke Mother   . Heart disease Father     heart failure  . Parkinsonism Maternal Grandfather   . Colon cancer Maternal Grandmother   . Prostate cancer Maternal Uncle   . Stroke Paternal Grandfather   . CAD Brother     CABG  . Diabetes Mother     BP  130/76  Pulse 60  Temp(Src) 97.9 F (36.6 C) (Oral)  Ht 5\' 7"  (1.702 m)  Wt 173 lb (78.472 kg)  BMI 27.09 kg/m2  SpO2 96%    Review of Systems denies weight loss, blurry vision, headache, chest pain, sob, n/v, urinary frequency, muscle cramps, excessive diaphoresis, depression, cold intolerance, rhinorrhea, and easy bruising.  He has chronic numbness and burning of the feet.      Objective:   Physical Exam VITAL SIGNS:  See vs page GENERAL: no distress Pulses: dorsalis pedis intact bilat.   Feet: no deformity.  no edema.  There is bilateral onychomycosis.   Skin:  no ulcer on the feet.  normal color and temp. Neuro: sensation is intact to touch on the feet, but decreased from normal  i reviewed electrocardiogram (01/28/14)  Lab Results  Component Value Date   HGBA1C 6.6* 08/22/2013   i have reviewed the following old records: Office notes.    Assessment & Plan:  DM: new to me.  Apparently well-controlled Osteoporosis: I am asked to check PTH, so it is ordered  Patient is advised the following: Patient Instructions  good diet and exercise habits significanly improve the control of your diabetes.  please let me know if you wish to be referred to a dietician.  high blood sugar is very risky to your health.  you should see an eye doctor and dentist every year.  It is very important to get all recommended vaccinations.  controlling your blood pressure and cholesterol drastically reduces the damage diabetes does to your body.  Those who smoke should quit.  please discuss these with your doctor.  check your blood sugar once a day.  vary the time of day when you check, between before the 3 meals, and at bedtime.  also check if you have symptoms of your blood sugar being too high or too low.  please keep a record of the readings and bring it to your next appointment here.  You can write it on any piece of paper.  please call us sooner if your blood sugar goes below 70, or if you have a  lot of readings over 200. blood tests are being requested for you today.  We'll contact you with results.   Please come back for a follow-up appointment in 6 months.

## 2014-06-03 LAB — PTH, INTACT AND CALCIUM
Calcium: 10.1 mg/dL (ref 8.4–10.5)
PTH: 8 pg/mL — AB (ref 14–64)

## 2014-06-03 LAB — BASIC METABOLIC PANEL
BUN: 21 mg/dL (ref 6–23)
CO2: 22 mEq/L (ref 19–32)
Calcium: 10 mg/dL (ref 8.4–10.5)
Chloride: 107 mEq/L (ref 96–112)
Creatinine, Ser: 1.1 mg/dL (ref 0.4–1.5)
GFR: 72.6 mL/min (ref >60.00)
Glucose, Bld: 102 mg/dL — ABNORMAL HIGH (ref 70–99)
Potassium: 4.2 mEq/L (ref 3.5–5.1)
SODIUM: 139 meq/L (ref 135–145)

## 2014-06-03 LAB — MAGNESIUM: MAGNESIUM: 2.1 mg/dL (ref 1.5–2.5)

## 2014-06-03 LAB — PHOSPHORUS: Phosphorus: 3.2 mg/dL (ref 2.3–4.6)

## 2014-06-04 ENCOUNTER — Other Ambulatory Visit: Payer: Self-pay

## 2014-06-04 LAB — PROTEIN ELECTROPHORESIS, SERUM
ALPHA-1-GLOBULIN: 3.2 % (ref 2.9–4.9)
ALPHA-2-GLOBULIN: 8.8 % (ref 7.1–11.8)
Albumin ELP: 64.9 % (ref 55.8–66.1)
Beta 2: 4.9 % (ref 3.2–6.5)
Beta Globulin: 6.5 % (ref 4.7–7.2)
Gamma Globulin: 11.7 % (ref 11.1–18.8)
Total Protein, Serum Electrophoresis: 7 g/dL (ref 6.0–8.3)

## 2014-06-04 MED ORDER — GLUCOSE BLOOD VI STRP: ORAL_STRIP | Status: DC

## 2014-06-20 ENCOUNTER — Other Ambulatory Visit: Payer: Self-pay | Admitting: Family Medicine

## 2014-06-23 NOTE — Telephone Encounter (Signed)
Received refill request electronically. Patient has an appointment scheduled 07/14/14. Is it okay to refill medication?

## 2014-06-24 ENCOUNTER — Telehealth: Payer: Self-pay | Admitting: Endocrinology

## 2014-06-24 NOTE — Telephone Encounter (Signed)
Pt advised that labs were good and no changes are needed at this time.

## 2014-06-24 NOTE — Telephone Encounter (Signed)
Sent. Thanks.   

## 2014-06-24 NOTE — Telephone Encounter (Signed)
Patient would like his lab results from 06/02/14  Please advise    Thank You

## 2014-06-26 ENCOUNTER — Telehealth: Payer: Self-pay | Admitting: Family Medicine

## 2014-06-26 DIAGNOSIS — Z125 Encounter for screening for malignant neoplasm of prostate: Secondary | ICD-10-CM

## 2014-06-26 DIAGNOSIS — E1149 Type 2 diabetes mellitus with other diabetic neurological complication: Secondary | ICD-10-CM

## 2014-06-26 NOTE — Telephone Encounter (Signed)
Patient is having lab work done for his physical in December.  Patient just had lab work done by [employer] [facility].  Patient wants to know if you still need for him to have his lab work done for the physical. Please cancel lab appointment if patient doesn't need to come.

## 2014-06-27 NOTE — Telephone Encounter (Signed)
Ordered, keep the lab appointment.  Thanks.

## 2014-06-27 NOTE — Addendum Note (Signed)
Addended by: Tonia Ghent on: 06/27/2014 05:44 AM   Modules accepted: Orders

## 2014-07-07 ENCOUNTER — Other Ambulatory Visit: Payer: Medicare Other

## 2014-07-10 ENCOUNTER — Other Ambulatory Visit (INDEPENDENT_AMBULATORY_CARE_PROVIDER_SITE_OTHER): Payer: Medicare Other

## 2014-07-10 DIAGNOSIS — Z125 Encounter for screening for malignant neoplasm of prostate: Secondary | ICD-10-CM

## 2014-07-10 DIAGNOSIS — E1149 Type 2 diabetes mellitus with other diabetic neurological complication: Secondary | ICD-10-CM

## 2014-07-10 DIAGNOSIS — E1140 Type 2 diabetes mellitus with diabetic neuropathy, unspecified: Secondary | ICD-10-CM

## 2014-07-10 LAB — LIPID PANEL
Cholesterol: 163 mg/dL (ref 0–200)
HDL: 28.4 mg/dL — ABNORMAL LOW (ref >39.00)
LDL CALC: 111 mg/dL — AB (ref 0–99)
NONHDL: 134.6
Total CHOL/HDL Ratio: 6
Triglycerides: 119 mg/dL (ref 0.0–149.0)
VLDL: 23.8 mg/dL (ref 0.0–40.0)

## 2014-07-10 LAB — HEPATIC FUNCTION PANEL
ALT: 23 U/L (ref 0–53)
AST: 22 U/L (ref 0–37)
Albumin: 4.6 g/dL (ref 3.5–5.2)
Alkaline Phosphatase: 43 U/L (ref 39–117)
BILIRUBIN DIRECT: 0.1 mg/dL (ref 0.0–0.3)
BILIRUBIN TOTAL: 1.1 mg/dL (ref 0.2–1.2)
Total Protein: 7 g/dL (ref 6.0–8.3)

## 2014-07-10 LAB — PSA, MEDICARE: PSA: 0.22 ng/mL (ref 0.10–4.00)

## 2014-07-14 ENCOUNTER — Ambulatory Visit (INDEPENDENT_AMBULATORY_CARE_PROVIDER_SITE_OTHER): Payer: Medicare Other | Admitting: Family Medicine

## 2014-07-14 ENCOUNTER — Encounter: Payer: Self-pay | Admitting: Family Medicine

## 2014-07-14 VITALS — BP 128/68 | HR 72 | Temp 97.8°F | Ht 67.0 in | Wt 177.0 lb

## 2014-07-14 DIAGNOSIS — E1140 Type 2 diabetes mellitus with diabetic neuropathy, unspecified: Secondary | ICD-10-CM

## 2014-07-14 DIAGNOSIS — E1149 Type 2 diabetes mellitus with other diabetic neurological complication: Secondary | ICD-10-CM

## 2014-07-14 DIAGNOSIS — Z7189 Other specified counseling: Secondary | ICD-10-CM

## 2014-07-14 DIAGNOSIS — Z23 Encounter for immunization: Secondary | ICD-10-CM

## 2014-07-14 DIAGNOSIS — Z951 Presence of aortocoronary bypass graft: Secondary | ICD-10-CM

## 2014-07-14 DIAGNOSIS — Z0000 Encounter for general adult medical examination without abnormal findings: Secondary | ICD-10-CM

## 2014-07-14 NOTE — Patient Instructions (Signed)
Take care. Glad to see you.  Keep the appointment with cardiology and I'll await their input.  Don't change your meds.  Check with your insurance to see if they will cover the shingles shot. I want to see you back in 6 months about your diabetes, if you aren't seeing Dr. Loanne Drilling.

## 2014-07-14 NOTE — Progress Notes (Signed)
Pre visit review using our clinic review tool, if applicable. No additional management support is needed unless otherwise documented below in the visit note.  I have personally reviewed the Medicare Annual Wellness questionnaire and have noted 1. The patient's medical and social history 2. Their use of alcohol, tobacco or illicit drugs 3. Their current medications and supplements 4. The patient's functional ability including ADL's, fall risks, home safety risks and hearing or visual             impairment. 5. Diet and physical activities 6. Evidence for depression or mood disorders  The patients weight, height, BMI have been recorded in the chart and visual acuity is per eye clinic.  I have made referrals, counseling and provided education to the patient based review of the above and I have provided the pt with a written personalized care plan for preventive services.  Provider list updated- see scanned forms.  Routine anticipatory guidance given to patient.  See health maintenance.  Flu 2015 at [facility] [place] d/w pt.  PNA 2015 Tetanus 2010 Colonoscopy 2014 PSA wnl.  Advance directive- wife or daughter designated if patient were incapacitated.  Cognitive function addressed- see scanned forms- and if abnormal then additional documentation follows.   Diabetes:  Using medications without difficulties: yes Hypoglycemic episodes:no Hyperglycemic episodes:no Feet problems: yes, pain in B feet but no loss in sensation, pain improved with current meds- gabapentin.  Prev A1c controlled, d/w pt.   CAD.  Minimal CP with exertion now, much improved from prev, and this isn't consistent.  Not SOB,no BLE edema.  "I'm way better than I was."  He has noted some R shoulder soreness after overexertion, but this may have been muscular and not truly cardiac.  This is improved recently.  He has cards f/u pending.    PMH and SH reviewed  Meds, vitals, and allergies reviewed.   ROS: See HPI.   Otherwise negative.    GEN: nad, alert and oriented HEENT: mucous membranes moist NECK: supple w/o LA CV: rrr. PULM: ctab, no inc wob ABD: soft, +bs EXT: no edema SKIN: no acute rash  Diabetic foot exam: Normal inspection No skin breakdown No calluses  Normal DP pulses Normal sensation to light touch and monofilament Nails normal

## 2014-07-15 DIAGNOSIS — Z7189 Other specified counseling: Secondary | ICD-10-CM | POA: Insufficient documentation

## 2014-07-15 NOTE — Assessment & Plan Note (Signed)
His exertional capacity is clearly improved and he'll keep his f/u with cards.  His LDL is above goal but he is statin intolerant and we didn't change his meds today.  He agrees.  App help of all invovlved.

## 2014-07-15 NOTE — Assessment & Plan Note (Signed)
Flu 2015 at [facility] [place] Shingles d/w pt.  PNA 2015 Tetanus 2010 Colonoscopy 2014 PSA wnl.  Advance directive- wife or daughter designated if patient were incapacitated.  Cognitive function addressed- see scanned forms- and if abnormal then additional documentation follows.

## 2014-07-15 NOTE — Assessment & Plan Note (Signed)
Controlled, he'll f/u with me if not with endo.  No change in meds.

## 2014-07-17 ENCOUNTER — Encounter (HOSPITAL_COMMUNITY): Payer: Self-pay | Admitting: Cardiovascular Disease

## 2014-07-24 ENCOUNTER — Ambulatory Visit: Payer: Medicare Other | Admitting: Cardiovascular Disease

## 2014-07-26 ENCOUNTER — Other Ambulatory Visit: Payer: Self-pay | Admitting: Family Medicine

## 2014-08-01 ENCOUNTER — Other Ambulatory Visit: Payer: Self-pay | Admitting: Cardiovascular Disease

## 2014-08-02 ENCOUNTER — Other Ambulatory Visit: Payer: Self-pay | Admitting: Family Medicine

## 2014-08-04 NOTE — Telephone Encounter (Signed)
Electronic refill request.  Last Filled:   12/18/13.  Just had CPE earlier this month.  Please advise.

## 2014-08-05 NOTE — Telephone Encounter (Signed)
Sent. Thanks.   

## 2014-09-02 ENCOUNTER — Encounter: Payer: Self-pay | Admitting: Cardiovascular Disease

## 2014-09-02 ENCOUNTER — Ambulatory Visit (INDEPENDENT_AMBULATORY_CARE_PROVIDER_SITE_OTHER): Payer: PPO | Admitting: Cardiovascular Disease

## 2014-09-02 VITALS — BP 140/60 | HR 60 | Ht 67.0 in | Wt 179.6 lb

## 2014-09-02 DIAGNOSIS — I2510 Atherosclerotic heart disease of native coronary artery without angina pectoris: Secondary | ICD-10-CM

## 2014-09-02 DIAGNOSIS — I480 Paroxysmal atrial fibrillation: Secondary | ICD-10-CM

## 2014-09-02 DIAGNOSIS — I10 Essential (primary) hypertension: Secondary | ICD-10-CM

## 2014-09-02 MED ORDER — METOPROLOL TARTRATE 25 MG PO TABS: 12.5000 mg | ORAL_TABLET | Freq: Two times a day (BID) | ORAL | Status: DC

## 2014-09-02 NOTE — Progress Notes (Signed)
History of Present Illness: 74 yo male with history of CAD s/p CABG, HTN, HLD, DM who is here today for cardiac follow up. I saw him January 2015 for a second opinion on his chest pain and abnormal stress test. He was seen by Dr. Acie Fredrickson 2 years ago for chest pain and underwent evaluation with a treadmill nuclear stress test as well as an echocardiogram. Both of them were unremarkable. Over the 3-4 months before his visit with me, he had been experiencing exertional substernal chest tightness and burning sensation. The pain occured when he was on the treadmill, starting in the left chest wall and radiating to his left shoulder and arm. He was seen by Dr. Fletcher Anon in our [facility] office 07/19/13 and had an exercise treadmill stress test with ST depression and chest pain. Cardiac cath was recommended but he wanted to think about this. I met him in January 2015 and arranged a cardiac cath on 08/21/13 and he was found to have severe three vessel CAD. He underwent 6V CABG 08/23/13 per Dr. Servando Snare. He has done well since then.  He has not tolerated statins in the past.   He is here today for follow up. No chest pain or SOB. He is very active. No palpitations, near syncope or syncope.   Primary Care Physician: [name]  Last Lipid Profile:Lipid Panel     Component Value Date/Time   CHOL 163 07/10/2014 0857   TRIG 119.0 07/10/2014 0857   HDL 28.40* 07/10/2014 0857   CHOLHDL 6 07/10/2014 0857   VLDL 23.8 07/10/2014 0857   LDLCALC 111* 07/10/2014 0857     Past Medical History  Diagnosis Date  . Diabetes mellitus   . Hypertension   . Chest pain   . Flank pain   . Diabetic neuropathy   . Anxiety   . Hypercholesterolemia   . History of bronchitis   . History of chicken pox   . Heart murmur   . Kidney stones   . 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 Diverticulosis   . Osteoarthritis   . Fatty liver   . Diverticulosis   . Family history of anesthesia complication     ' they cant wake my brother very easy"  . Coronary artery disease 08/21/2013    severe triple vessel  . Anginal pain   . GERD (gastroesophageal reflux disease)     Past Surgical History  Procedure Laterality Date  . Hernia repair      X3  . Cholecystectomy  1983  . Cardiac catheterization  08/21/2013    DR Angelena Form  . Coronary artery bypass graft N/A 08/23/2013    Procedure: CORONARY ARTERY BYPASS GRAFTING (CABG);  Surgeon: Grace Isaac, MD;  Location: [place];  Service: Open Heart Surgery;  Laterality: N/A;  CABG times six utilizing the left internal mammary artery and the right greater saphenous vein harvested endoscopically  . Intraoperative transesophageal echocardiogram N/A 08/23/2013    Procedure: INTRAOPERATIVE TRANSESOPHAGEAL ECHOCARDIOGRAM;  Surgeon: Grace Isaac, MD;  Location: [place];  Service: Open Heart Surgery;  Laterality: N/A;  . Endovein harvest of greater saphenous vein Right 08/23/2013    Procedure: ENDOVEIN HARVEST OF GREATER SAPHENOUS VEIN;  Surgeon: Grace Isaac, MD;  Location: [place];  Service: Open Heart Surgery;  Laterality: Right;  . Left heart catheterization with coronary angiogram N/A 08/21/2013    Procedure: LEFT HEART CATHETERIZATION WITH CORONARY ANGIOGRAM;  Surgeon: Annita Brod  Angelena Form, MD;  Location: [place] CATH LAB;  Service: Cardiovascular;  Laterality: N/A;    Current Outpatient Prescriptions  Medication Sig Dispense Refill  . acyclovir (ZOVIRAX) 800 MG tablet Take 800 mg by mouth 2 (two) times daily.    Marland Kitchen aspirin EC 81 MG tablet Take 1 tablet (81 mg total) by mouth daily.    . fenofibrate 160 MG tablet TAKE 1 TABLET BY MOUTH AT BEDTIME 90 tablet 3  . gabapentin (NEURONTIN) 600 MG tablet TAKE 1 TABLET BY MOUTH 3 TIMES A DAY 270 tablet 3  . losartan (COZAAR) 50 MG tablet TAKE 1 TABLET BY MOUTH ONCE A DAY 30 tablet 5  . metFORMIN (GLUCOPHAGE) 850 MG tablet TAKE 1 TABLET BY MOUTH 2 TIMES A DAY WITH A MEAL 60 tablet 5  . metoprolol tartrate  (LOPRESSOR) 25 MG tablet Take 0.5 tablets (12.5 mg total) by mouth 2 (two) times daily. 15 tablet 11   No current facility-administered medications for this visit.    Allergies  Allergen Reactions  . Aspirin     Bleeding at high dose  . Statins     Other reaction(s): Other (See Comments) Other Reaction: MYALGIA Muscle pain    History   Social History  . Marital Status: Married    Spouse Name: N/A    Number of Children: N/A  . Years of Education: N/A   Occupational History  . Retired    Social History Main Topics  . Smoking status: Never Smoker   . Smokeless tobacco: Never Used  . Alcohol Use: No  . Drug Use: No  . Sexual Activity: Not on file   Other Topics Concern  . 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 Stroke Mother   . Heart disease Father     heart failure  . Parkinsonism Maternal Grandfather   . Colon cancer Maternal Grandmother   . Prostate cancer Maternal Uncle   . Stroke Paternal Grandfather   . CAD Brother     CABG  . Diabetes Mother     Review of Systems:  As stated in the HPI and otherwise negative.   BP 140/60 mmHg  Pulse 60  Ht 5' 7"  (1.702 m)  Wt 179 lb 9.6 oz (81.466 kg)  BMI 28.12 kg/m2  SpO2 98%  Physical Examination: General: Well developed, well nourished, NAD HEENT: OP clear, mucus membranes moist SKIN: warm, dry. No rashes. Neuro: No focal deficits Musculoskeletal: Muscle strength 5/5 all ext Psychiatric: Mood and affect normal Neck: No JVD, no carotid bruits, no thyromegaly, no lymphadenopathy. Lungs:Clear bilaterally, no wheezes, rhonci, crackles Cardiovascular: Regular rate and rhythm. Systolic murmur. No gallops or rubs. Abdomen:Soft. Bowel sounds present. Non-tender.  Extremities: No lower extremity edema. Pulses  are 2 + in the bilateral DP/PT.  Cardiac cath 08/21/13: Left main: No obstructive disease.  Left Anterior Descending Artery: Large caliber vessel that courses to the apex and gives off a moderate caliber diagonal branch. The mid LAD has 50% stenosis followed by a focal 70-80% stenosis. The distal vessel has diffuse disease with serial 90% stenoses just before the vessel wraps around the apex. The diagonal branch is moderate in caliber with 99% stenosis in the mid vessel just before a bifurcation.  Circumflex Artery: Large  caliber vessel with 99% proximal stenosis. The mid vessel has diffuse 40% stenosis. There is a moderate to large caliber obtuse marginal branch that arises distally with 80% proximal stenosis. The AV groove Circumflex beyond the takeoff of the OM branch has 99% stenosis.  Right Coronary Artery: Large caliber, dominant vessel with 100% proximal occlusion after a large conus branch that supplies a portion of the RV wall. The proximal, mid and distal vessel fills from left to right collaterals.  Left Ventricular Angiogram: LVEF=60%.    Assessment and Plan:   1. CAD:  He is now s/p 6V CABG. Stable. NO exertional chest pain. Continue current medical therapy with ASA and Lopressor. He is intolerant of statins and unwilling to try again.   2. HTN: BP has been controlled at home. NO changes today.   3. Post-op atrial fib: Appears to be in sinus today. No indication for long term anti-coagulation. Will continue ASA.

## 2014-09-02 NOTE — Patient Instructions (Signed)
Your physician wants you to follow-up in:  6 months. You will receive a reminder letter in the mail two months in advance. If you don't receive a letter, please call our office to schedule the follow-up appointment.   

## 2014-09-05 ENCOUNTER — Other Ambulatory Visit: Payer: Self-pay | Admitting: Cardiovascular Disease

## 2014-09-12 ENCOUNTER — Ambulatory Visit (INDEPENDENT_AMBULATORY_CARE_PROVIDER_SITE_OTHER): Payer: PPO | Admitting: Endocrinology

## 2014-09-12 ENCOUNTER — Encounter: Payer: Self-pay | Admitting: Endocrinology

## 2014-09-12 VITALS — BP 134/80 | HR 65 | Temp 97.5°F | Ht 67.0 in | Wt 179.0 lb

## 2014-09-12 DIAGNOSIS — E1149 Type 2 diabetes mellitus with other diabetic neurological complication: Secondary | ICD-10-CM

## 2014-09-12 DIAGNOSIS — E1140 Type 2 diabetes mellitus with diabetic neuropathy, unspecified: Secondary | ICD-10-CM

## 2014-09-12 LAB — HEMOGLOBIN A1C: HEMOGLOBIN A1C: 6.6 % — AB (ref 4.6–6.5)

## 2014-09-12 LAB — TSH: TSH: 2.06 u[IU]/mL (ref 0.35–4.50)

## 2014-09-12 NOTE — Patient Instructions (Signed)
check your blood sugar once a day.  vary the time of day when you check, between before the 3 meals, and at bedtime.  also check if you have symptoms of your blood sugar being too high or too low.  please keep a record of the readings and bring it to your next appointment here.  You can write it on any piece of paper.  please call us sooner if your blood sugar goes below 70, or if you have a lot of readings over 200. blood tests are being requested for you today.  We'll contact you with results.   Please come back for a follow-up appointment in 6 months.

## 2014-09-12 NOTE — Progress Notes (Signed)
Subjective:    Patient ID: [name] [name], male    DOB: 1941-05-14, 74 y.o.   MRN: [id number]  HPI pt says he is here today for DM.  Pt states DM was dx'ed in 2002; he has moderate neuropathy of the lower extremities; and associated CAD; he has never been on insulin; pt says his diet is poor, but exercise is good; he has never had pancreatitis, severe hypoglycemia or DKA.    pt states he feels well in general.   Past Medical History  Diagnosis Date  . Diabetes mellitus   . Hypertension   . Chest pain   . Flank pain   . Diabetic neuropathy   . Anxiety   . Hypercholesterolemia   . History of bronchitis   . History of chicken pox   . Heart murmur   . Kidney stones   . 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 Diverticulosis   . Osteoarthritis   . Fatty liver   . Diverticulosis   . Family history of anesthesia complication     ' they cant wake my brother very easy"  . Coronary artery disease 08/21/2013    severe triple vessel  . Anginal pain   . GERD (gastroesophageal reflux disease)     Past Surgical History  Procedure Laterality Date  . Hernia repair      X3  . Cholecystectomy  1983  . Cardiac catheterization  08/21/2013    DR Angelena Form  . Coronary artery bypass graft N/A 08/23/2013    Procedure: CORONARY ARTERY BYPASS GRAFTING (CABG);  Surgeon: Grace Isaac, MD;  Location: [place];  Service: Open Heart Surgery;  Laterality: N/A;  CABG times six utilizing the left internal mammary artery and the right greater saphenous vein harvested endoscopically  . Intraoperative transesophageal echocardiogram N/A 08/23/2013    Procedure: INTRAOPERATIVE TRANSESOPHAGEAL ECHOCARDIOGRAM;  Surgeon: Grace Isaac, MD;  Location: [place];  Service: Open Heart Surgery;  Laterality: N/A;  . Endovein harvest of greater saphenous vein Right 08/23/2013    Procedure: ENDOVEIN HARVEST OF GREATER SAPHENOUS VEIN;  Surgeon: Grace Isaac, MD;  Location: [place];  Service: Open Heart  Surgery;  Laterality: Right;  . Left heart catheterization with coronary angiogram N/A 08/21/2013    Procedure: LEFT HEART CATHETERIZATION WITH CORONARY ANGIOGRAM;  Surgeon: Burnell Blanks, MD;  Location: [facility] C[facility] LAB;  Service: Cardiovascular;  Laterality: N/A;    History   Social History  . Marital Status: Married    Spouse Name: N/A    Number of Children: N/A  . Years of Education: N/A   Occupational History  . Retired    Social History Main Topics  . Smoking status: Never Smoker   . Smokeless tobacco: Never Used  . Alcohol Use: No  . Drug Use: No  . Sexual Activity: Not on file   Other Topics Concern  . 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 acyclovir (ZOVIRAX) 800 MG tablet Take 800 mg by mouth 2 (two) times daily.    Marland Kitchen aspirin EC 81 MG tablet Take 1 tablet (81 mg total) by mouth daily.    . fenofibrate 160 MG tablet TAKE  1 TABLET BY MOUTH AT BEDTIME 90 tablet 3  . gabapentin (NEURONTIN) 600 MG tablet TAKE 1 TABLET BY MOUTH 3 TIMES A DAY 270 tablet 3  . losartan (COZAAR) 50 MG tablet TAKE 1 TABLET BY MOUTH ONCE A DAY 30 tablet 5  . metFORMIN (GLUCOPHAGE) 850 MG tablet TAKE 1 TABLET BY MOUTH 2 TIMES A DAY WITH A MEAL 60 tablet 5  . metoprolol tartrate (LOPRESSOR) 25 MG tablet Take 0.5 tablets (12.5 mg total) by mouth 2 (two) times daily. 90 tablet 3   No current facility-administered medications on file prior to visit.    Allergies  Allergen Reactions  . Aspirin     Bleeding at high dose  . Statins     Other reaction(s): Other (See Comments) Other Reaction: MYALGIA Muscle pain    Family History  Problem Relation Age of Onset  . Stroke Mother   . Heart disease Father     heart failure  . Parkinsonism  Maternal Grandfather   . Colon cancer Maternal Grandmother   . Prostate cancer Maternal Uncle   . Stroke Paternal Grandfather   . CAD Brother     CABG  . Diabetes Mother     BP 134/80 mmHg  Pulse 65  Temp(Src) 97.5 F (36.4 C) (Oral)  Ht 5\' 7"  (1.702 m)  Wt 179 lb (81.194 kg)  BMI 28.03 kg/m2  SpO2 95%   Review of Systems Denies weight change.    Objective:   Physical Exam  VITAL SIGNS:  See vs page GENERAL: no distress.   Pulses: dorsalis pedis intact bilat.   Feet: no deformity.  no edema.  There is bilateral onychomycosis.   Skin:  no ulcer on the feet.  normal color and temp. Neuro: sensation is intact to touch on the feet, but decreased from normal  Lab Results  Component Value Date   HGBA1C 6.6* 09/12/2014       Assessment & Plan:  DM: well-controlled   Patient is advised the following: Patient Instructions  check your blood sugar once a day.  vary the time of day when you check, between before the 3 meals, and at bedtime.  also check if you have symptoms of your blood sugar being too high or too low.  please keep a record of the readings and bring it to your next appointment here.  You can write it on any piece of paper.  please call us sooner if your blood sugar goes below 70, or if you have a lot of readings over 200. blood tests are being requested for you today.  We'll contact you with results.   Please come back for a follow-up appointment in 6 months.

## 2014-10-29 IMAGING — CR DG CHEST 1V PORT
1 series · 1 of 1 positions shown · non-contrast
Comparison: 08/24/2013

CLINICAL DATA: Post cardiac surgery.

EXAM:
PORTABLE CHEST - 1 VIEW

[AP]
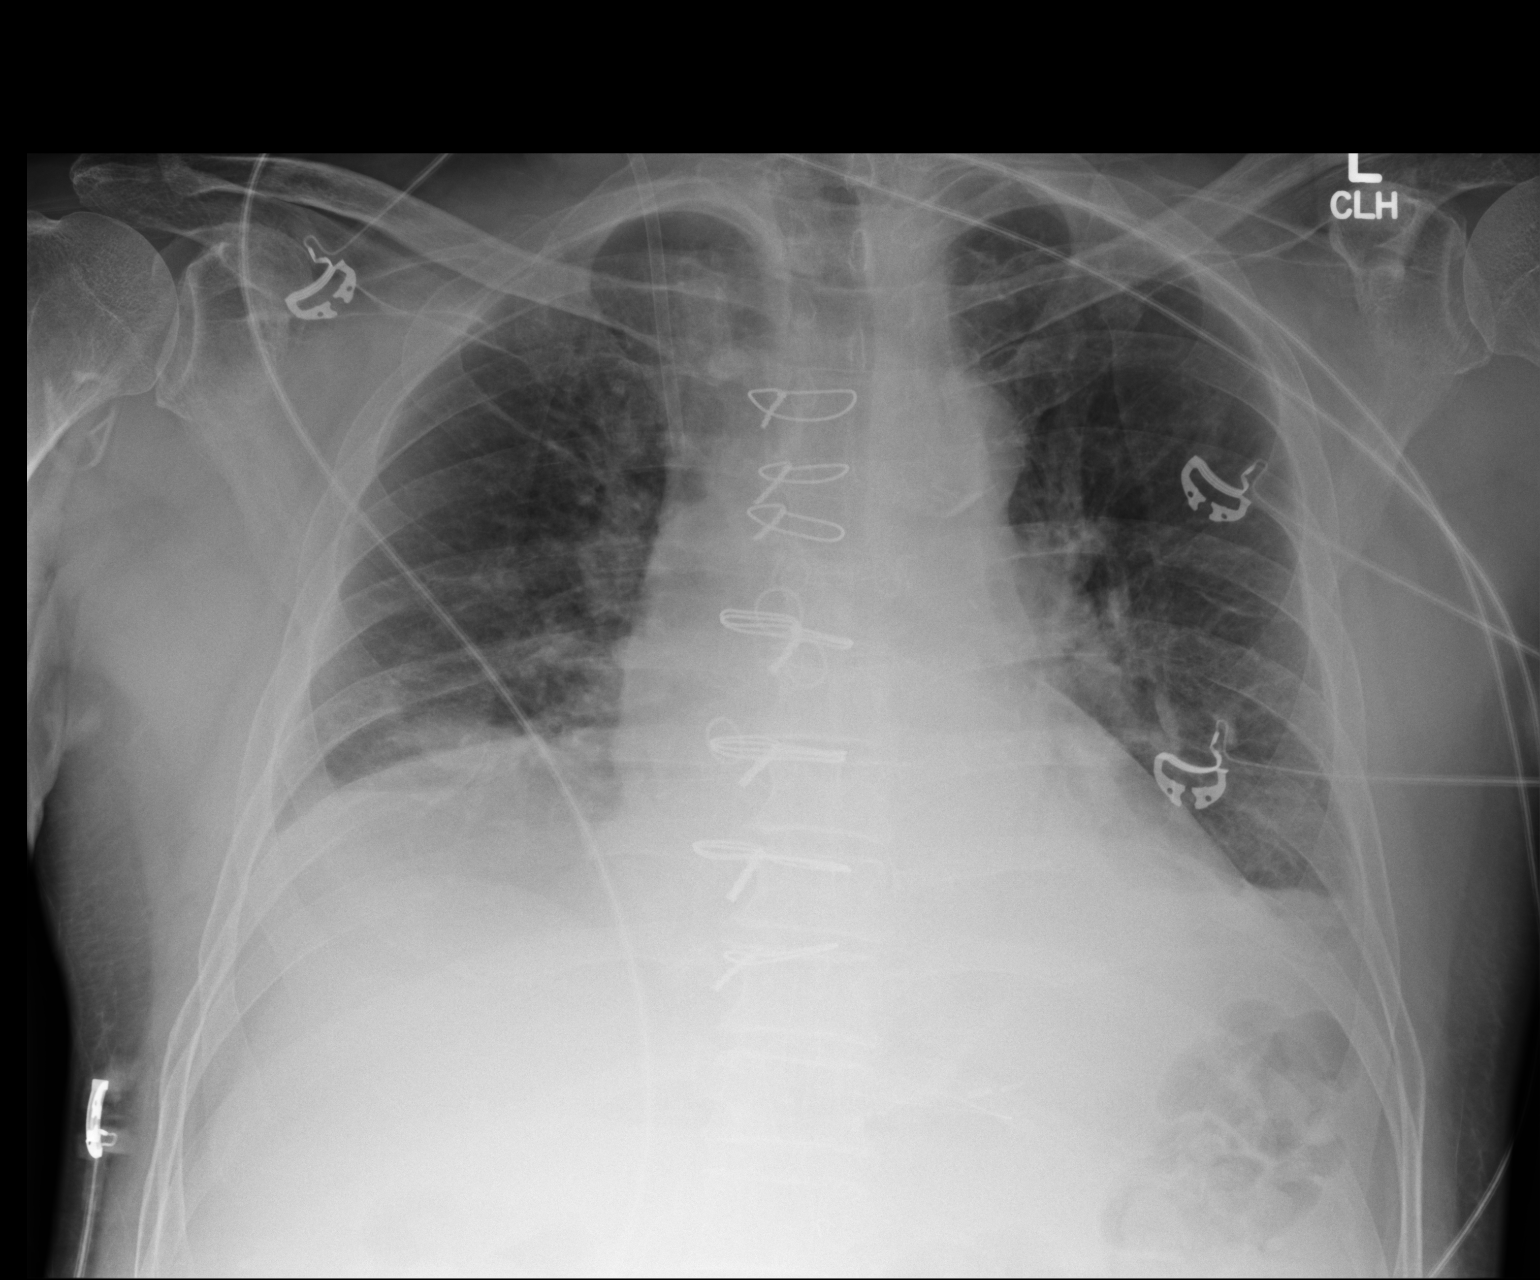

[1 of 1 positions shown; findings below may reference images not displayed]

FINDINGS: The chest drains were removed. No evidence for a large pneumothorax.
Swan-Ganz catheter removed. Right jugular central line is still
present. Left basilar atelectasis with slightly improved aeration at
the left lung base. Hazy densities at the right lung base are
suggestive for atelectasis. Median sternotomy wires are present. The
trachea is midline. Heart size is stable.
IMPRESSION: Removal of chest drains without a pneumothorax.

Bibasilar atelectasis.

## 2014-10-30 IMAGING — CR DG CHEST 2V
2 series · 2 of 2 positions shown · non-contrast
Comparison: 08/25/2013

CLINICAL DATA: Followup CABG

EXAM:
CHEST  2 VIEW

[w chest pa]
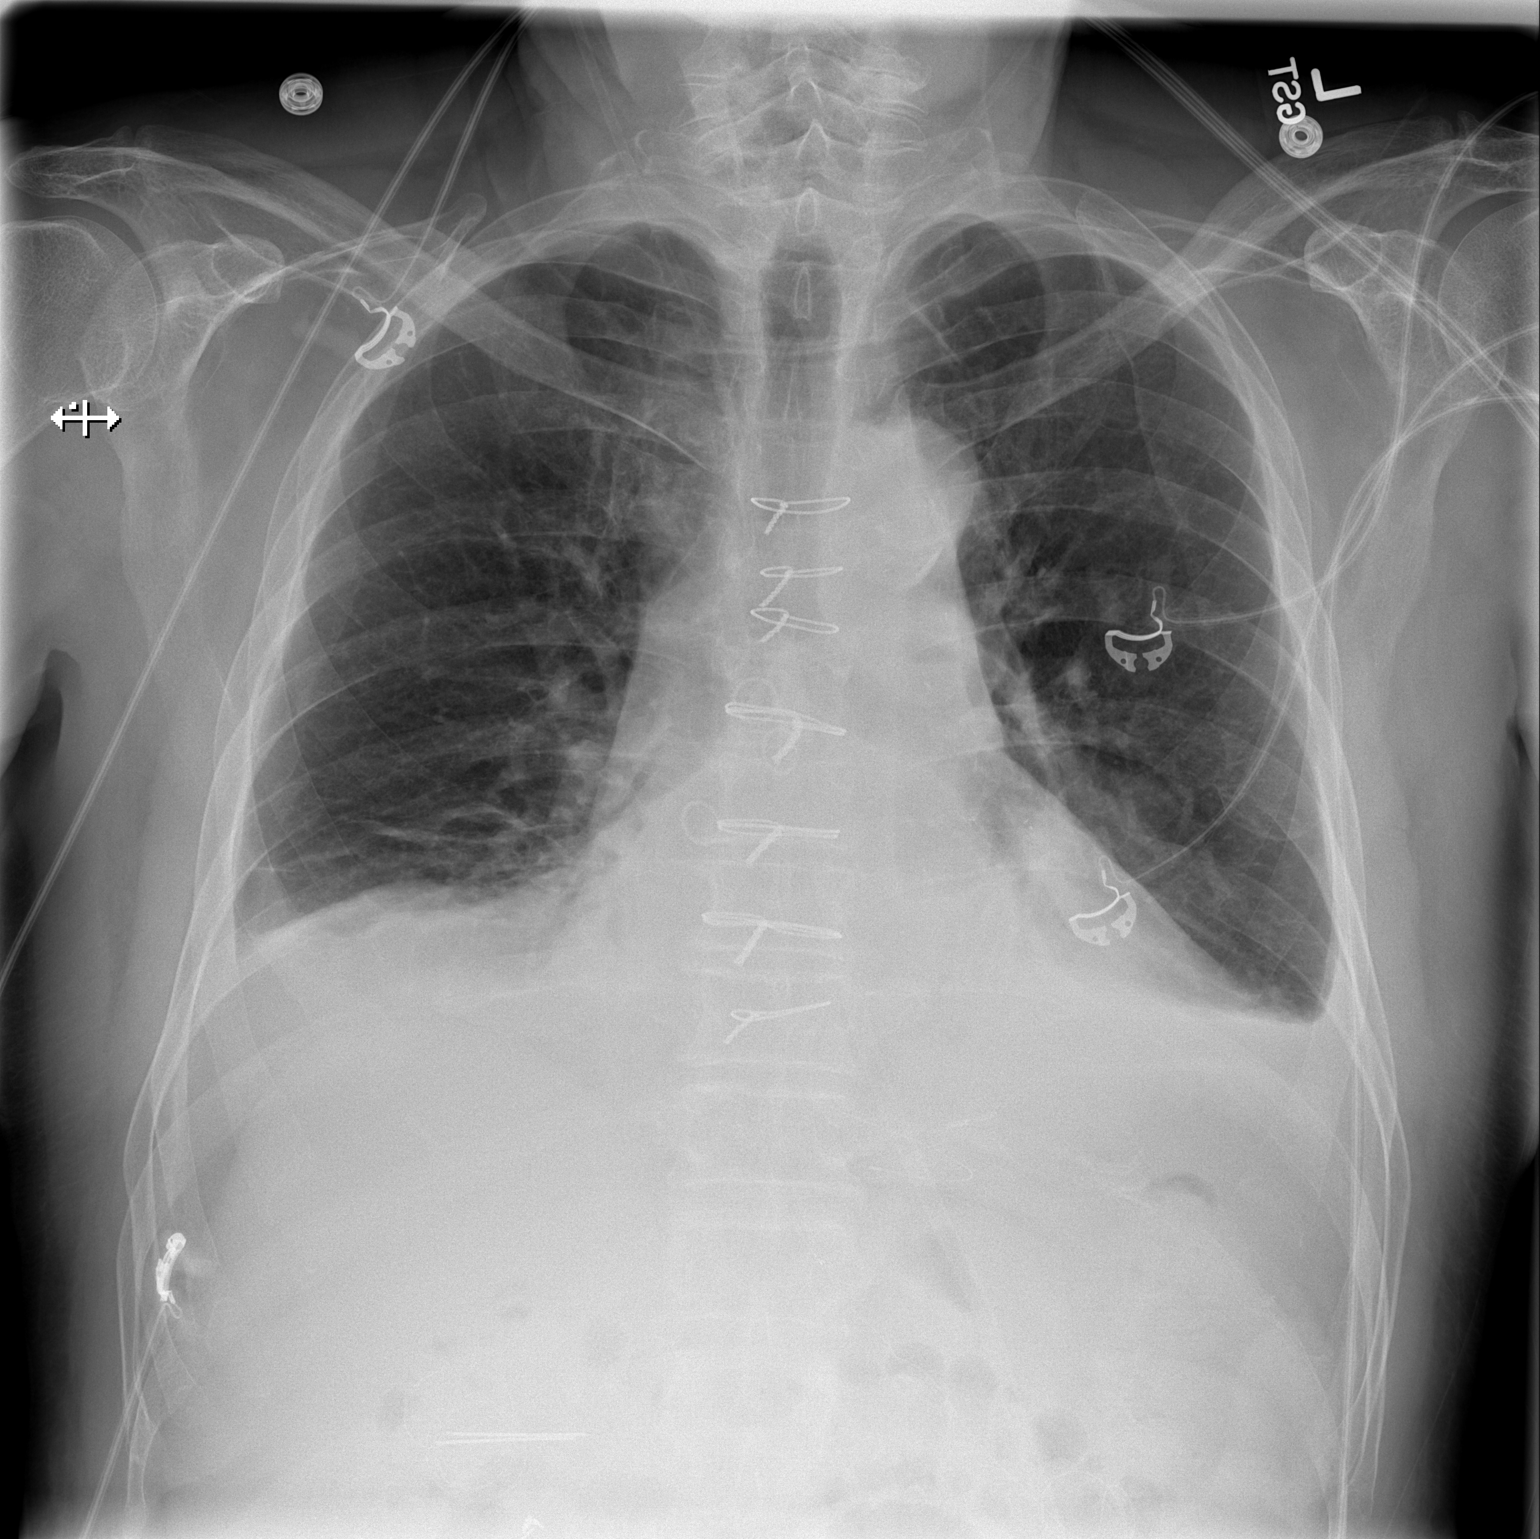

[w chest lat]
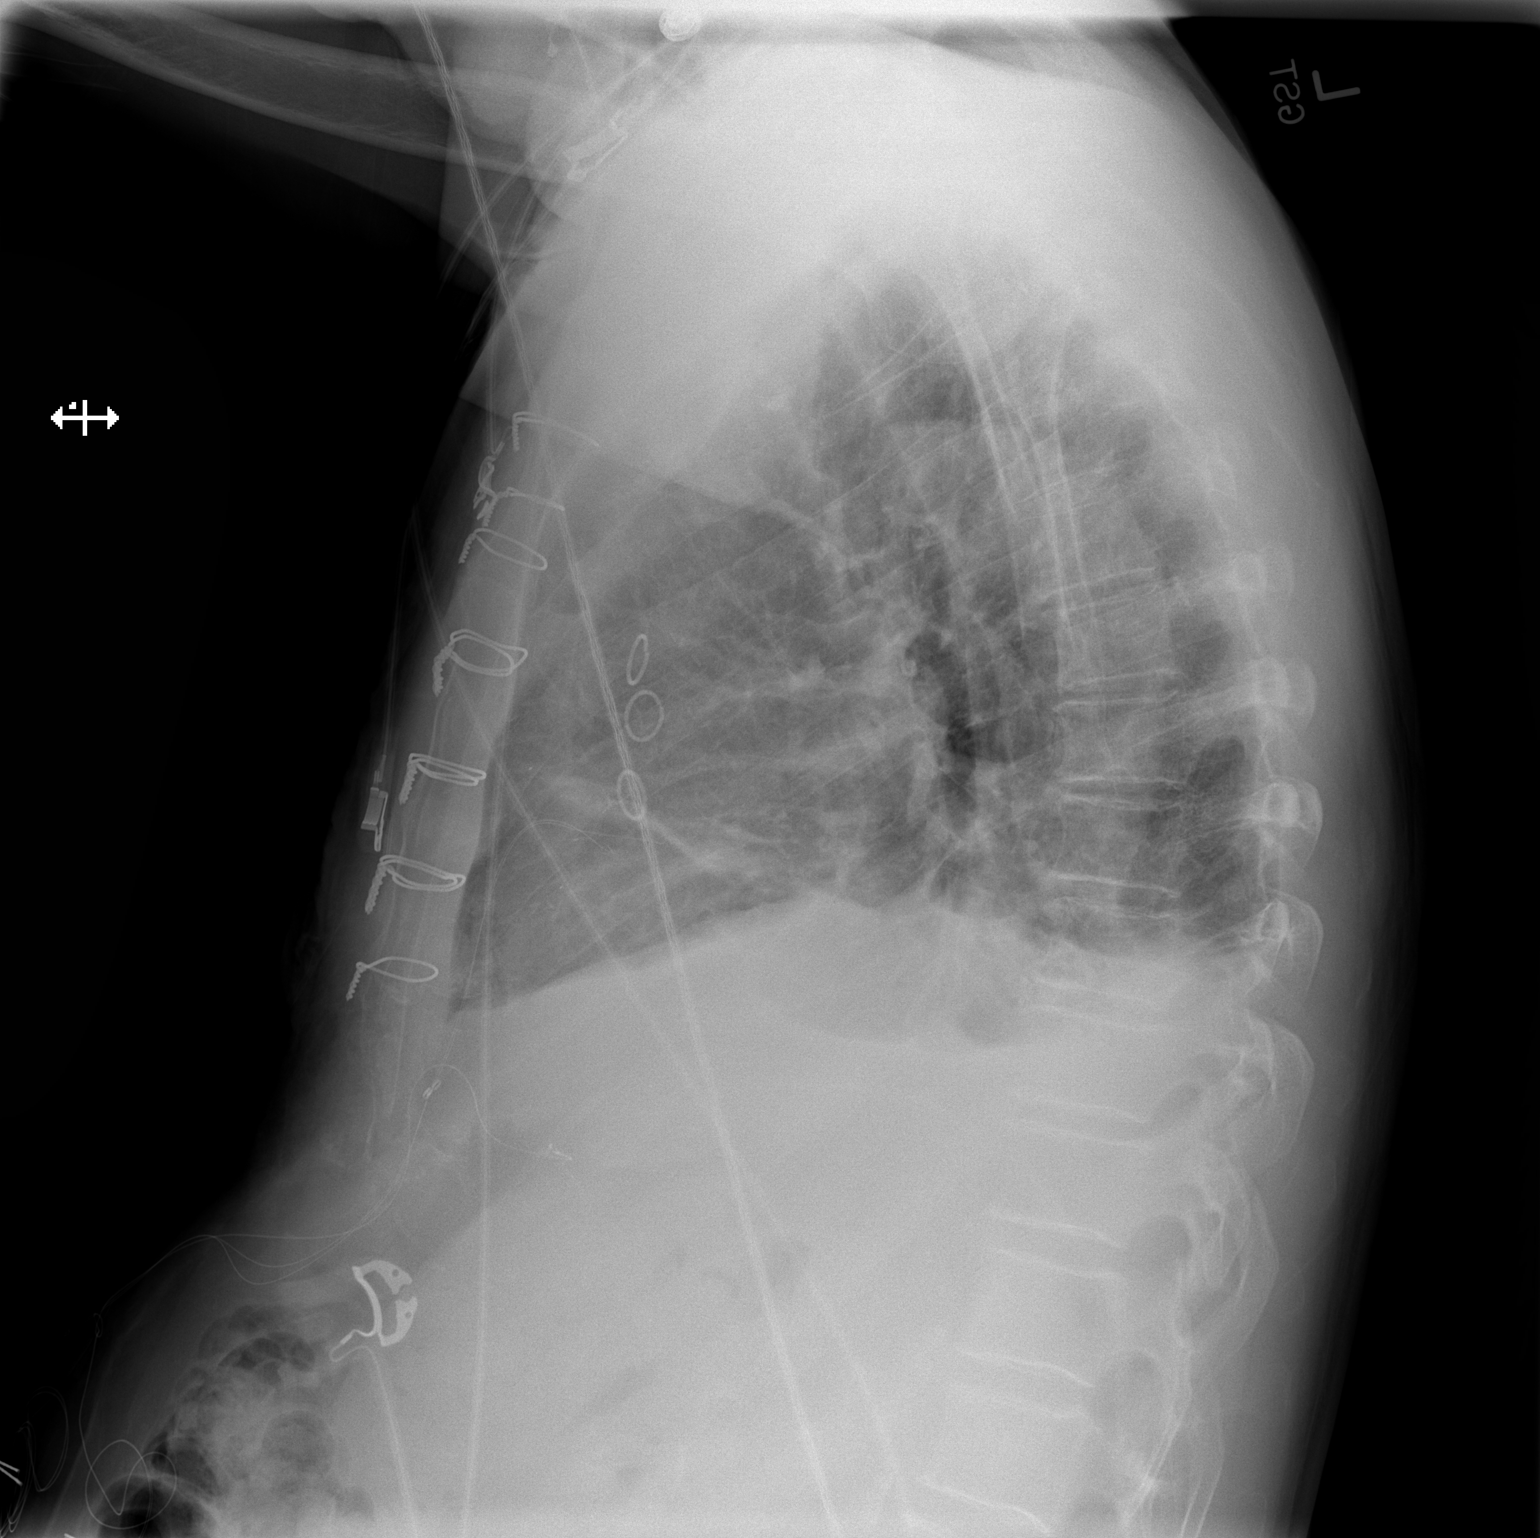

[2 of 2 positions shown; findings below may reference images not displayed]

FINDINGS: There has been interval removal of the right IJ catheter.
Postoperative changes compatible with CABG procedure are again
identified. There are bilateral pleural effusions stress set there
are small to moderate bilateral pleural effusions which appear
unchanged in volume from previous exam. Bibasilar atelectasis is
also present.
IMPRESSION: 1. Bilateral pleural effusions and bibasilar atelectasis, unchanged
from previous exam.

## 2014-11-07 LAB — HM DIABETES EYE EXAM

## 2014-11-27 ENCOUNTER — Ambulatory Visit: Payer: PPO | Admitting: Family Medicine

## 2014-11-27 IMAGING — CR DG CHEST 2V
2 series · 2 of 2 positions shown · non-contrast
Comparison: 08/27/2013

CLINICAL DATA: CABG 1 month ago.

EXAM:
CHEST  2 VIEW

[w chest pa]
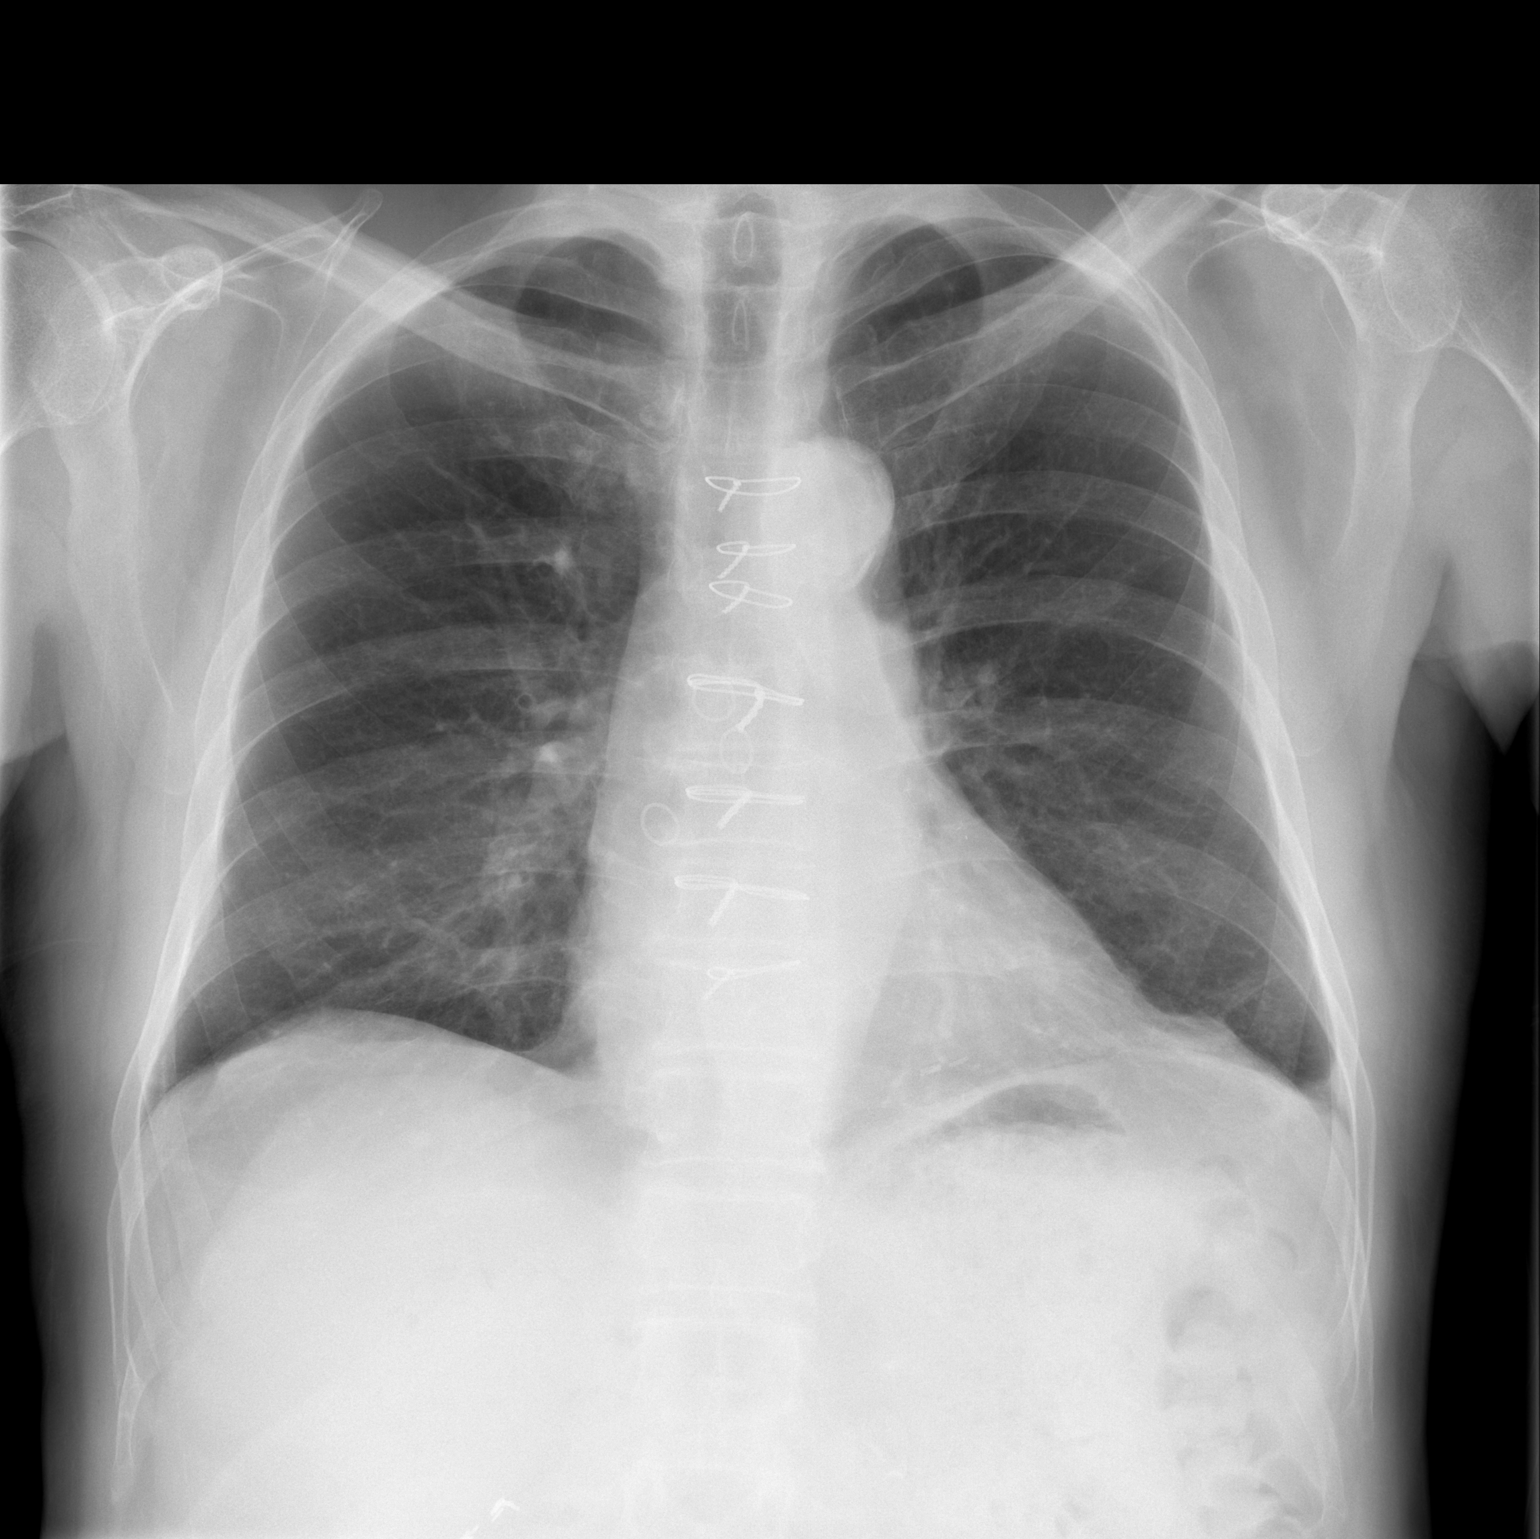

[w chest lat]
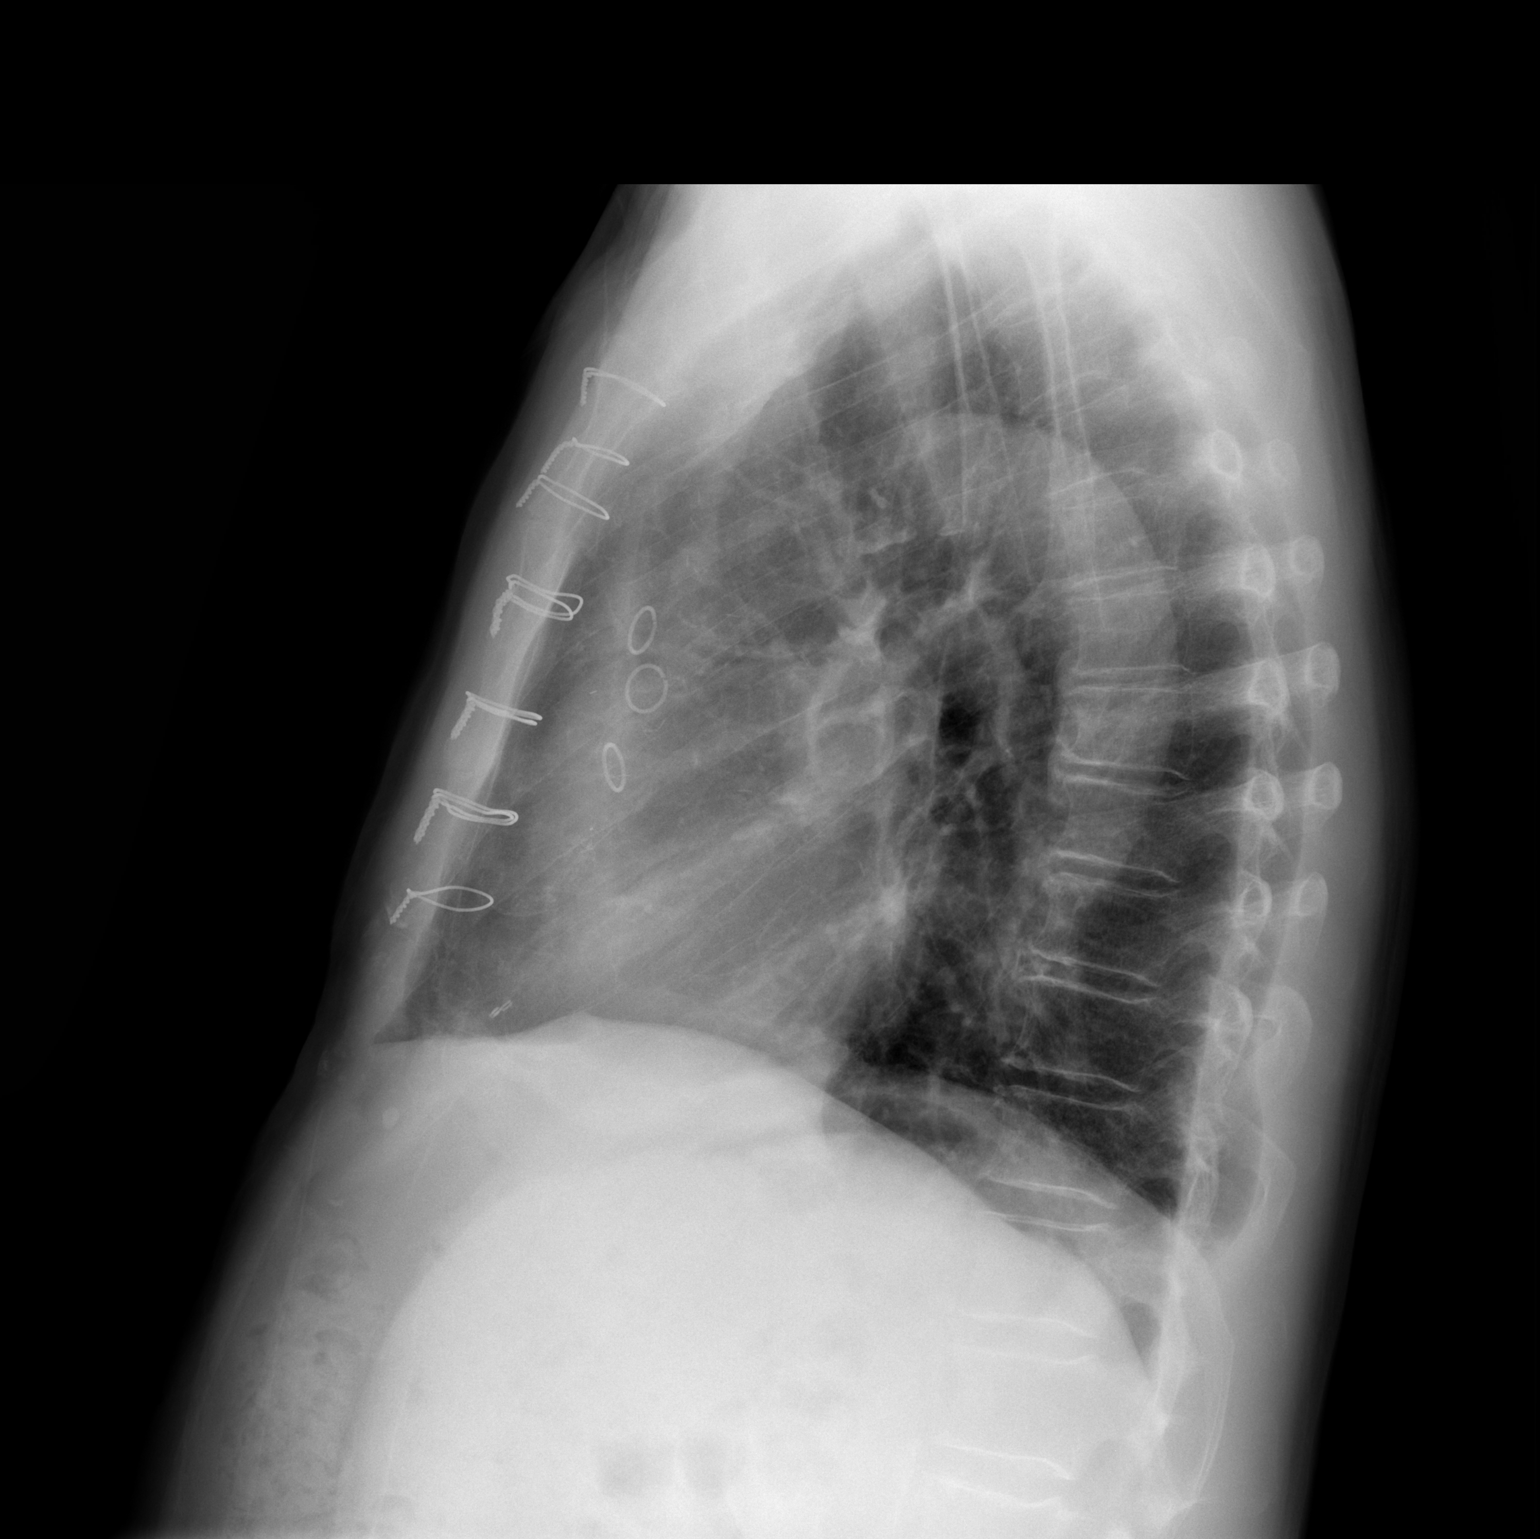

[2 of 2 positions shown; findings below may reference images not displayed]

FINDINGS: The small bilateral pleural effusions and associated bibasilar
atelectasis seen on the previous study have resolved in the
interval. The The cardiopericardial silhouette is within normal
limits for size. Patient is status post CABG. Imaged bony structures
of the thorax are intact.
IMPRESSION: Interval resolution of basilar atelectasis and small bilateral
pleural effusions. No acute cardiopulmonary findings.

## 2015-01-19 ENCOUNTER — Other Ambulatory Visit: Payer: Self-pay | Admitting: Cardiovascular Disease

## 2015-02-04 ENCOUNTER — Other Ambulatory Visit: Payer: Self-pay | Admitting: Family Medicine

## 2015-02-17 ENCOUNTER — Encounter: Payer: Self-pay | Admitting: Family Medicine

## 2015-02-17 ENCOUNTER — Ambulatory Visit (INDEPENDENT_AMBULATORY_CARE_PROVIDER_SITE_OTHER): Payer: PPO | Admitting: Family Medicine

## 2015-02-17 ENCOUNTER — Other Ambulatory Visit: Payer: Self-pay | Admitting: *Deleted

## 2015-02-17 VITALS — BP 126/60 | HR 60 | Temp 98.3°F | Wt 161.8 lb

## 2015-02-17 DIAGNOSIS — R079 Chest pain, unspecified: Secondary | ICD-10-CM | POA: Diagnosis not present

## 2015-02-17 DIAGNOSIS — F411 Generalized anxiety disorder: Secondary | ICD-10-CM

## 2015-02-17 DIAGNOSIS — E119 Type 2 diabetes mellitus without complications: Secondary | ICD-10-CM

## 2015-02-17 DIAGNOSIS — R634 Abnormal weight loss: Secondary | ICD-10-CM | POA: Diagnosis not present

## 2015-02-17 LAB — CBC WITH DIFFERENTIAL/PLATELET
Basophils Absolute: 0 10*3/uL (ref 0.0–0.1)
Basophils Relative: 0.5 % (ref 0.0–3.0)
EOS ABS: 0.1 10*3/uL (ref 0.0–0.7)
Eosinophils Relative: 1.9 % (ref 0.0–5.0)
HCT: 43.5 % (ref 39.0–52.0)
Hemoglobin: 14.5 g/dL (ref 13.0–17.0)
Lymphocytes Relative: 20.3 % (ref 12.0–46.0)
Lymphs Abs: 1.3 10*3/uL (ref 0.7–4.0)
MCHC: 33.4 g/dL (ref 30.0–36.0)
MCV: 95.1 fl (ref 78.0–100.0)
MONO ABS: 0.5 10*3/uL (ref 0.1–1.0)
Monocytes Relative: 7.3 % (ref 3.0–12.0)
Neutro Abs: 4.6 10*3/uL (ref 1.4–7.7)
Neutrophils Relative %: 70 % (ref 43.0–77.0)
PLATELETS: 172 10*3/uL (ref 150.0–400.0)
RBC: 4.57 Mil/uL (ref 4.22–5.81)
RDW: 13.9 % (ref 11.5–15.5)
WBC: 6.5 10*3/uL (ref 4.0–10.5)

## 2015-02-17 LAB — COMPREHENSIVE METABOLIC PANEL
ALBUMIN: 4.4 g/dL (ref 3.5–5.2)
ALT: 16 U/L (ref 0–53)
AST: 19 U/L (ref 0–37)
Alkaline Phosphatase: 47 U/L (ref 39–117)
BUN: 21 mg/dL (ref 6–23)
CO2: 28 meq/L (ref 19–32)
Calcium: 10.2 mg/dL (ref 8.4–10.5)
Chloride: 106 mEq/L (ref 96–112)
Creatinine, Ser: 1.04 mg/dL (ref 0.40–1.50)
GFR: 74.07 mL/min (ref >60.00)
Glucose, Bld: 104 mg/dL — ABNORMAL HIGH (ref 70–99)
Potassium: 4.4 mEq/L (ref 3.5–5.1)
SODIUM: 141 meq/L (ref 135–145)
Total Bilirubin: 0.7 mg/dL (ref 0.2–1.2)
Total Protein: 7 g/dL (ref 6.0–8.3)

## 2015-02-17 LAB — HEMOGLOBIN A1C: Hgb A1c MFr Bld: 6.1 % (ref 4.6–6.5)

## 2015-02-17 LAB — TSH: TSH: 0.88 u[IU]/mL (ref 0.35–4.50)

## 2015-02-17 MED ORDER — HYDROXYZINE HCL 10 MG PO TABS: 10.0000 mg | ORAL_TABLET | Freq: Three times a day (TID) | ORAL | Status: DC | PRN

## 2015-02-17 MED ORDER — NITROGLYCERIN 0.4 MG SL SUBL: 0.4000 mg | SUBLINGUAL_TABLET | SUBLINGUAL | Status: DC | PRN

## 2015-02-17 NOTE — Progress Notes (Signed)
Pre visit review using our clinic review tool, if applicable. No additional management support is needed unless otherwise documented below in the visit note.  Started losing weight about 3 months ago.  No FCNAVD.  Not SOB.  No CP unless he really "overdoes it".  That is new over the last 3 months.  Resolves with rest.  He has f/u with cards pending.    Springtails infestation.  They were able to trap some on an adhesive and get the organism ID'd in [place].  He has treated the house, bed, etc. He's anxious about the infestation.    He doesn't itch but has a rash on his legs and arms.  He has the feeling of a presence of something on the skin in the affected areas.  Sleep disrupted and appetite is affected.    L groin pain.  Intermittent.  H/o hernia repair prev, h/o prostatitis.  No painful urination.    Hasn't checked sugar recently.    Meds, vitals, and allergies reviewed.   ROS: See HPI.  Otherwise, noncontributory.  Nad ncat  Mmm rrr ctab abd soft not ttp, normal BS Ext without edema  Blanching irregular rash noted on the BLE > BUE

## 2015-02-17 NOTE — Patient Instructions (Signed)
Go to the lab on the way out.  We'll contact you with your lab report. Let me get some extra information in the meantime.  Use atarax if needed.  It can make you drowsy.  You can split the pills if needed.   Take care.  Glad to see you.

## 2015-02-19 ENCOUNTER — Telehealth: Payer: Self-pay

## 2015-02-19 ENCOUNTER — Other Ambulatory Visit: Payer: Self-pay | Admitting: Family Medicine

## 2015-02-19 DIAGNOSIS — R079 Chest pain, unspecified: Secondary | ICD-10-CM | POA: Insufficient documentation

## 2015-02-19 DIAGNOSIS — F411 Generalized anxiety disorder: Secondary | ICD-10-CM | POA: Insufficient documentation

## 2015-02-19 MED ORDER — BUSPIRONE HCL 10 MG PO TABS: 5.0000 mg | ORAL_TABLET | Freq: Three times a day (TID) | ORAL | Status: DC | PRN

## 2015-02-19 MED ORDER — TRIAMCINOLONE ACETONIDE 0.1 % EX CREA: 1.0000 "application " | TOPICAL_CREAM | Freq: Two times a day (BID) | CUTANEOUS | Status: DC

## 2015-02-19 NOTE — Telephone Encounter (Signed)
Butch Penny with [employer] left v/m wanting confirmation that prior auth was received that was previously faxed; did not leave name of med and if PA can be done over phone call [phone] using ref # [id number].

## 2015-02-19 NOTE — Telephone Encounter (Signed)
Fax received and placed in Dr. Buckner [place].  PA may no longer be needed if Rx has been discontinued.

## 2015-02-19 NOTE — Progress Notes (Signed)
Patient advised.

## 2015-02-19 NOTE — Assessment & Plan Note (Signed)
No sx at rest, no sx with moderate exercise.  Only with brief sx with heavy exertion.  He has NTG to use in the meantime if needed.  I'll ask cards about moving his appointment up.  He doesn't have sx that have recently/rapidly increased and is still okay for outpatient f/u. >25 minutes spent in face to face time with patient, >50% spent in counselling or coordination of care.

## 2015-02-19 NOTE — Progress Notes (Signed)
Stop atarax.  Can try buspar for anxiety.  Would start with 1/2 tab BID, can work up to 1/2-1 tab TID prn anxiety.  Sedation caution.  Would use triamcinolone on the rash to see if that helps.   Update me as needed.   Med list updated, rxs sent.   Thanks.

## 2015-02-19 NOTE — Telephone Encounter (Signed)
Left detailed message that the fax had not been received and asking if it could be re-faxed.

## 2015-02-19 NOTE — Assessment & Plan Note (Signed)
From what I can find, springtails aren't usually a threat to humans.  He is anxious about needing to get his home treated.  He can try atarax in the meantime.  His rash looks to be more from irritation, not active cellulitis.  He isn't itching so we didn't add on other meds.   Unclear about the weight loss- would check basic labs.  Again unclear if from anxiety re: infestation.

## 2015-02-20 NOTE — Telephone Encounter (Signed)
No need to do, since med has been stopped. Please fax back with notes on the paper.  Thanks.

## 2015-02-20 NOTE — Telephone Encounter (Signed)
Faxed

## 2015-02-24 ENCOUNTER — Other Ambulatory Visit: Payer: Self-pay | Admitting: Family Medicine

## 2015-03-02 ENCOUNTER — Ambulatory Visit (INDEPENDENT_AMBULATORY_CARE_PROVIDER_SITE_OTHER): Payer: PPO | Admitting: Cardiovascular Disease

## 2015-03-02 ENCOUNTER — Encounter: Payer: Self-pay | Admitting: Cardiovascular Disease

## 2015-03-02 VITALS — BP 120/76 | HR 47 | Ht 67.0 in | Wt 163.0 lb

## 2015-03-02 DIAGNOSIS — I25810 Atherosclerosis of coronary artery bypass graft(s) without angina pectoris: Secondary | ICD-10-CM | POA: Diagnosis not present

## 2015-03-02 DIAGNOSIS — I480 Paroxysmal atrial fibrillation: Secondary | ICD-10-CM | POA: Diagnosis not present

## 2015-03-02 DIAGNOSIS — I10 Essential (primary) hypertension: Secondary | ICD-10-CM

## 2015-03-02 MED ORDER — NITROGLYCERIN 0.4 MG SL SUBL: 0.4000 mg | SUBLINGUAL_TABLET | SUBLINGUAL | Status: DC | PRN

## 2015-03-02 NOTE — Patient Instructions (Signed)
Medication Instructions:   Your physician recommends that you continue on your current medications as directed. Please refer to the Current Medication list given to you today.   Labwork:  none   Testing/Procedures: None   Follow-Up: Your physician wants you to follow-up in: 6 months.  You will receive a reminder letter in the mail two months in advance. If you don't receive a letter, please call our office to schedule the follow-up appointment.

## 2015-03-02 NOTE — Progress Notes (Signed)
Chief Complaint  Patient presents with  . Chest Pain    History of Present Illness: 74 yo male with history of CAD s/p CABG, HTN, HLD, DM who is here today for cardiac follow up. I saw him January 2015 for a second opinion on his chest pain and abnormal stress test. He was seen by Dr. Acie Fredrickson 2 years ago for chest pain and underwent evaluation with a treadmill nuclear stress test as well as an echocardiogram. Both of them were unremarkable. Over the 3-4 months before his visit with me, he had been experiencing exertional substernal chest tightness and burning sensation. The pain occured when he was on the treadmill, starting in the left chest wall and radiating to his left shoulder and arm. He was seen by Dr. Fletcher Anon in our [facility] office 07/19/13 and had an exercise treadmill stress test with ST depression and chest pain. Cardiac cath was recommended but he wanted to think about this. I met him in January 2015 and arranged a cardiac cath on 08/21/13 and he was found to have severe three vessel CAD. He underwent 6V CABG 08/23/13 per Dr. Servando Snare. He has done well since then.  He has not tolerated statins in the past.   He is here today for follow up. He describes one episode chest pressure while working with his garden hose. No dyspnea or dizziness associated with the chest pressure. Lasted 1 minute and resolved with cold water from the hose sprayed over his arm. No exertional chest pain. He is very active. No palpitations, near syncope or syncope.   Primary Care Physician:  Damita Dunnings  Last Lipid Profile:Lipid Panel     Component Value Date/Time   CHOL 163 07/10/2014 0857   TRIG 119.0 07/10/2014 0857   HDL 28.40* 07/10/2014 0857   CHOLHDL 6 07/10/2014 0857   VLDL 23.8 07/10/2014 0857   LDLCALC 111* 07/10/2014 0857     Past Medical History  Diagnosis Date  . Diabetes mellitus   . Hypertension   . Chest pain   . Flank pain   . Diabetic neuropathy   . Anxiety   . Hypercholesterolemia     . History of bronchitis   . History of chicken pox   . Heart murmur   . Kidney stones   . 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 Diverticulosis   . Osteoarthritis   . Fatty liver   . Diverticulosis   . Family history of anesthesia complication     ' they cant wake my brother very easy"  . Coronary artery disease 08/21/2013    severe triple vessel  . Anginal pain   . GERD (gastroesophageal reflux disease)     Past Surgical History  Procedure Laterality Date  . Hernia repair      X3  . Cholecystectomy  1983  . Cardiac catheterization  08/21/2013    DR Angelena Form  . Coronary artery bypass graft N/A 08/23/2013    Procedure: CORONARY ARTERY BYPASS GRAFTING (CABG);  Surgeon: Grace Isaac, MD;  Location: [place];  Service: Open Heart Surgery;  Laterality: N/A;  CABG times six utilizing the left internal mammary artery and the right greater saphenous vein harvested endoscopically  . Intraoperative transesophageal echocardiogram N/A 08/23/2013    Procedure: INTRAOPERATIVE TRANSESOPHAGEAL ECHOCARDIOGRAM;  Surgeon: Grace Isaac, MD;  Location: [place];  Service: Open Heart Surgery;  Laterality: N/A;  . Endovein harvest of greater saphenous vein Right 08/23/2013  Procedure: ENDOVEIN HARVEST OF GREATER SAPHENOUS VEIN;  Surgeon: Grace Isaac, MD;  Location: [place];  Service: Open Heart Surgery;  Laterality: Right;  . Left heart catheterization with coronary angiogram N/A 08/21/2013    Procedure: LEFT HEART CATHETERIZATION WITH CORONARY ANGIOGRAM;  Surgeon: Burnell Blanks, MD;  Location: [facility] CATH LAB;  Service: Cardiovascular;  Laterality: N/A;    Current Outpatient Prescriptions  Medication Sig Dispense Refill  . acyclovir (ZOVIRAX) 800 MG tablet Take 800 mg by mouth 2 (two) times daily.    Marland Kitchen aspirin EC 81 MG tablet Take 1 tablet (81 mg total) by mouth daily.    . busPIRone (BUSPAR) 10 MG tablet Take 0.5-1 tablets (5-10 mg total) by mouth 3 (three) times daily  as needed (anxiety). 30 tablet 1  . fenofibrate 160 MG tablet TAKE 1 TABLET BY MOUTH AT BEDTIME 90 tablet 1  . gabapentin (NEURONTIN) 600 MG tablet TAKE 1 TABLET BY MOUTH 3 TIMES A DAY 270 tablet 3  . metFORMIN (GLUCOPHAGE) 850 MG tablet TAKE 1 TABLET BY MOUTH TWICE A DAY WITH MEALS 60 tablet 5  . metoprolol tartrate (LOPRESSOR) 25 MG tablet Take 0.5 tablets (12.5 mg total) by mouth 2 (two) times daily. 90 tablet 3  . nitroGLYCERIN (NITROSTAT) 0.4 MG SL tablet Place 1 tablet (0.4 mg total) under the tongue every 5 (five) minutes as needed for chest pain (max 3 doses). 25 tablet 6  . triamcinolone cream (KENALOG) 0.1 % Apply 1 application topically 2 (two) times daily. 30 g 1  . losartan (COZAAR) 50 MG tablet TAKE 1 TABLET BY MOUTH ONCE A DAY 30 tablet 5   No current facility-administered medications for this visit.    Allergies  Allergen Reactions  . Aspirin     Bleeding at high dose  . Statins     Other reaction(s): Other (See Comments) Other Reaction: MYALGIA Muscle pain    History   Social History  . Marital Status: Married    Spouse Name: N/A  . Number of Children: N/A  . Years of Education: N/A   Occupational History  . Retired    Social History Main Topics  . Smoking status: Never Smoker   . Smokeless tobacco: Never Used  . Alcohol Use: No  . Drug Use: No  . Sexual Activity: Not on file   Other Topics Concern  . 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 Stroke Mother   . Heart disease Father     heart failure  . Parkinsonism Maternal Grandfather   . Colon cancer Maternal Grandmother   . Prostate cancer Maternal Uncle   . Stroke Paternal Grandfather   . CAD Brother     CABG  . Diabetes Mother     Review of Systems:  As stated in the HPI and otherwise  negative.   BP 120/76 mmHg  Pulse 47  Ht 5' 7"  (1.702 m)  Wt 163 lb (73.936 kg)  BMI 25.52 kg/m2  Physical Examination: General: Well developed, well nourished, NAD HEENT: OP clear, mucus membranes moist SKIN: warm, dry. No rashes. Neuro: No focal deficits Musculoskeletal: Muscle strength 5/5 all ext Psychiatric: Mood and affect normal Neck: No JVD, no carotid bruits, no thyromegaly, no lymphadenopathy. Lungs:Clear bilaterally,  no wheezes, rhonci, crackles Cardiovascular: Regular rate and rhythm. Systolic murmur. No gallops or rubs. Abdomen:Soft. Bowel sounds present. Non-tender.  Extremities: No lower extremity edema. Pulses are 2 + in the bilateral DP/PT.  Cardiac cath 08/21/13: Left main: No obstructive disease.  Left Anterior Descending Artery: Large caliber vessel that courses to the apex and gives off a moderate caliber diagonal branch. The mid LAD has 50% stenosis followed by a focal 70-80% stenosis. The distal vessel has diffuse disease with serial 90% stenoses just before the vessel wraps around the apex. The diagonal branch is moderate in caliber with 99% stenosis in the mid vessel just before a bifurcation.  Circumflex Artery: Large caliber vessel with 99% proximal stenosis. The mid vessel has diffuse 40% stenosis. There is a moderate to large caliber obtuse marginal branch that arises distally with 80% proximal stenosis. The AV groove Circumflex beyond the takeoff of the OM branch has 99% stenosis.  Right Coronary Artery: Large caliber, dominant vessel with 100% proximal occlusion after a large conus branch that supplies a portion of the RV wall. The proximal, mid and distal vessel fills from left to right collaterals.  Left Ventricular Angiogram: LVEF=60%.   EKG:  EKG is ordered today. The ekg ordered today demonstrates sinus brady, rate 47 bpm. 1st degree AV block.   Recent Labs: 06/02/2014: Magnesium 2.1 02/17/2015: ALT 16; BUN 21; Creatinine, Ser 1.04; Hemoglobin 14.5;  Platelets 172.0; Potassium 4.4; Sodium 141; TSH 0.88   Lipid Panel    Component Value Date/Time   CHOL 163 07/10/2014 0857   TRIG 119.0 07/10/2014 0857   HDL 28.40* 07/10/2014 0857   CHOLHDL 6 07/10/2014 0857   VLDL 23.8 07/10/2014 0857   LDLCALC 111* 07/10/2014 0857   LDLDIRECT 79.8 07/08/2013 0906     Wt Readings from Last 3 Encounters:  03/02/15 163 lb (73.936 kg)  02/17/15 161 lb 12 oz (73.369 kg)  09/12/14 179 lb (81.194 kg)     Other studies Reviewed: Additional studies/ records that were reviewed today include: . Review of the above records demonstrates:    Assessment and Plan:   1. CAD:  He is now s/p 6V CABG. Stable. He describes chest pressure one day when working in the yard but it quickly resolved. This does not sound like angina. No exertional chest pressure and he is very active. He has not used NTG. Continue current medical therapy with ASA and Lopressor. He is intolerant of statins and unwilling to try again. Will give NTG to use prn.   2. HTN: BP is controlled.    3. Post-op atrial fib: He is in sinus today. No indication for long term anti-coagulation. Will continue ASA.   Current medicines are reviewed at length with the patient today.  The patient does not have concerns regarding medicines.  The following changes have been made:  no change  Labs/ tests ordered today include:   Orders Placed This Encounter  Procedures  . EKG 12-Lead    Disposition:   FU with me in 6 months  Signed, Lauree Chandler, MD 03/03/2015 7:31 AM    [facility] [place], [place], [place]  [address] Phone: [phone]; Fax: [phone]

## 2015-03-17 ENCOUNTER — Ambulatory Visit: Payer: PPO | Admitting: Endocrinology

## 2015-04-01 ENCOUNTER — Other Ambulatory Visit: Payer: Self-pay

## 2015-04-01 NOTE — Telephone Encounter (Signed)
Pt left note requesting refill acyclovir to [facility]; do not see on current or hx where med has been filled by Dr Damita Dunnings. Pt said buspar was marginal and request 30 day refill.  buspar last refilled # 30 x 1 on 02/19/15. Spoke with [facility] and pt got Buspar # 30 on 02/19/15 and again #30 on 03/06/15.Please advise.

## 2015-04-01 NOTE — Telephone Encounter (Signed)
Please clarify this for me about the marginal statement.   Thanks.

## 2015-04-02 MED ORDER — BUSPIRONE HCL 10 MG PO TABS: 5.0000 mg | ORAL_TABLET | Freq: Three times a day (TID) | ORAL | Status: DC | PRN

## 2015-04-02 MED ORDER — ACYCLOVIR 800 MG PO TABS: 800.0000 mg | ORAL_TABLET | Freq: Two times a day (BID) | ORAL | Status: DC

## 2015-04-02 NOTE — Telephone Encounter (Signed)
Spoke with pt and marginal meant that it helped somewhat; pt said is able to sleep for awhile but pt still wakes up and feels like the bugs are trying to get into every crevice of his body.pt said Dr Damita Dunnings mentioned if this med did not work there was another med to try. Pt is willing to try the other med or continue Buspar if Dr Damita Dunnings thinks that is best. Pt request cb.

## 2015-04-02 NOTE — Telephone Encounter (Signed)
Called and spoke to patient's wife and was advised that he is out of town until tomorrow. Patient's wife [name] [facility]) advised as instructed and verbalized understanding.

## 2015-04-02 NOTE — Telephone Encounter (Signed)
Both sent.  I would try scheduling the buspar for a period of time to see if that help.  Based on the fill dates, was likely taking it prn.   If worked up to tid scheduled w/o relief, then let me know.  Thanks.

## 2015-04-06 ENCOUNTER — Ambulatory Visit: Payer: PPO | Admitting: Cardiovascular Disease

## 2015-04-29 ENCOUNTER — Other Ambulatory Visit: Payer: Self-pay | Admitting: Family Medicine

## 2015-04-29 NOTE — Telephone Encounter (Signed)
Received refill request electronically Last refill 04/02/15 #30/1 Last office visit 02/17/15 Is it okay to refill?

## 2015-04-29 NOTE — Telephone Encounter (Signed)
Sent. Thanks.   

## 2015-05-23 ENCOUNTER — Other Ambulatory Visit: Payer: Self-pay | Admitting: Family Medicine

## 2015-05-25 NOTE — Telephone Encounter (Signed)
Last cpx 07/2014, last ov was in 02/2015. No future appt scheduled.

## 2015-05-26 NOTE — Telephone Encounter (Signed)
Sent. Thanks.   

## 2015-06-02 ENCOUNTER — Encounter: Payer: Self-pay | Admitting: Internal Medicine

## 2015-06-02 ENCOUNTER — Encounter: Payer: Self-pay | Admitting: Gastroenterology

## 2015-06-17 ENCOUNTER — Other Ambulatory Visit: Payer: Self-pay | Admitting: Family Medicine

## 2015-07-06 ENCOUNTER — Other Ambulatory Visit: Payer: Self-pay

## 2015-07-06 MED ORDER — LOSARTAN POTASSIUM 50 MG PO TABS: 50.0000 mg | ORAL_TABLET | Freq: Every day | ORAL | Status: DC

## 2015-07-07 ENCOUNTER — Other Ambulatory Visit: Payer: Self-pay

## 2015-07-07 ENCOUNTER — Other Ambulatory Visit: Payer: Self-pay | Admitting: Family Medicine

## 2015-07-07 MED ORDER — METFORMIN HCL 850 MG PO TABS: 850.0000 mg | ORAL_TABLET | Freq: Two times a day (BID) | ORAL | Status: DC

## 2015-07-08 NOTE — Telephone Encounter (Signed)
Received refill request electronically Last refill 06/18/15 #30/1 Last office visit 02/17/15 Is it okay to refill?

## 2015-07-09 NOTE — Telephone Encounter (Signed)
Sent. Thanks.   

## 2015-07-16 ENCOUNTER — Telehealth: Payer: Self-pay | Admitting: Family Medicine

## 2015-07-16 ENCOUNTER — Other Ambulatory Visit: Payer: Self-pay | Admitting: Family Medicine

## 2015-07-16 DIAGNOSIS — Z125 Encounter for screening for malignant neoplasm of prostate: Secondary | ICD-10-CM

## 2015-07-16 DIAGNOSIS — E1149 Type 2 diabetes mellitus with other diabetic neurological complication: Secondary | ICD-10-CM

## 2015-07-16 NOTE — Telephone Encounter (Signed)
If the absence of ongoing sx (bad night sweats, persistent fevers, a draining surgical site) then it doesn't appear that he'll need any f/u tests.  I have the letter in my office if he wants it back.  Thanks.

## 2015-07-16 NOTE — Telephone Encounter (Signed)
Pt dropped off letter from [facility]  He has labs tomorrow and wanted to know if he needs additional labs per letter. Letter in dr Josefine Class box

## 2015-07-16 NOTE — Telephone Encounter (Signed)
Left detailed message on voicemail.  Letter placed at the front desk for pickup.

## 2015-07-17 ENCOUNTER — Other Ambulatory Visit (INDEPENDENT_AMBULATORY_CARE_PROVIDER_SITE_OTHER): Payer: PPO

## 2015-07-17 DIAGNOSIS — Z125 Encounter for screening for malignant neoplasm of prostate: Secondary | ICD-10-CM | POA: Diagnosis not present

## 2015-07-17 DIAGNOSIS — E1149 Type 2 diabetes mellitus with other diabetic neurological complication: Secondary | ICD-10-CM | POA: Diagnosis not present

## 2015-07-17 LAB — LIPID PANEL
CHOL/HDL RATIO: 4
Cholesterol: 129 mg/dL (ref 0–200)
HDL: 36.8 mg/dL — ABNORMAL LOW (ref >39.00)
LDL CALC: 77 mg/dL (ref 0–99)
NonHDL: 92.64
Triglycerides: 78 mg/dL (ref 0.0–149.0)
VLDL: 15.6 mg/dL (ref 0.0–40.0)

## 2015-07-17 LAB — HEMOGLOBIN A1C: HEMOGLOBIN A1C: 6.3 % (ref 4.6–6.5)

## 2015-07-17 LAB — PSA, MEDICARE: PSA: 0.19 ng/mL (ref 0.10–4.00)

## 2015-07-24 ENCOUNTER — Encounter: Payer: Self-pay | Admitting: Family Medicine

## 2015-07-24 ENCOUNTER — Ambulatory Visit (INDEPENDENT_AMBULATORY_CARE_PROVIDER_SITE_OTHER): Payer: PPO | Admitting: Family Medicine

## 2015-07-24 VITALS — BP 128/60 | HR 58 | Temp 97.4°F | Ht 67.0 in | Wt 169.8 lb

## 2015-07-24 DIAGNOSIS — Z23 Encounter for immunization: Secondary | ICD-10-CM

## 2015-07-24 DIAGNOSIS — E1149 Type 2 diabetes mellitus with other diabetic neurological complication: Secondary | ICD-10-CM

## 2015-07-24 DIAGNOSIS — Z0000 Encounter for general adult medical examination without abnormal findings: Secondary | ICD-10-CM

## 2015-07-24 DIAGNOSIS — R079 Chest pain, unspecified: Secondary | ICD-10-CM

## 2015-07-24 MED ORDER — FENOFIBRATE 160 MG PO TABS: 160.0000 mg | ORAL_TABLET | Freq: Every day | ORAL | Status: DC

## 2015-07-24 MED ORDER — GABAPENTIN 600 MG PO TABS: 600.0000 mg | ORAL_TABLET | Freq: Three times a day (TID) | ORAL | Status: DC

## 2015-07-24 NOTE — Progress Notes (Signed)
Pre visit review using our clinic review tool, if applicable. No additional management support is needed unless otherwise documented below in the visit note.  I have personally reviewed the Medicare Annual Wellness questionnaire and have noted 1. The patient's medical and social history 2. Their use of alcohol, tobacco or illicit drugs 3. Their current medications and supplements 4. The patient's functional ability including ADL's, fall risks, home safety risks and hearing or visual             impairment. 5. Diet and physical activities 6. Evidence for depression or mood disorders  The patients weight, height, BMI have been recorded in the chart and visual acuity is per eye clinic.  I have made referrals, counseling and provided education to the patient based review of the above and I have provided the pt with a written personalized care plan for preventive services.  Provider list updated- see scanned forms.  Routine anticipatory guidance given to patient.  See health maintenance.  Flu 2016 Shingles d/w pt.  PNA 2015 Tetanus 2010 Colonoscopy 2014 Prostate cancer screening- PSA 2016 Advance directive- d/w pt- wife or daughter equally designated if patient were incapacitated.   Cognitive function addressed- see scanned forms- and if abnormal then additional documentation follows.   Diabetes:  Using medications without difficulties: yes Hypoglycemic episodes:no Hyperglycemic episodes:no Feet problems:no neuro sx with gabapentin but B plantar fasciitis pain noted.   Blood Sugars averaging:not checked.  eye exam within last year: yes, 11/2014   H/o CAD Using medication without problems or lightheadedness: yes Chest pain with exertion: still with L sided chest pain.  Happens intermittently, can happen at rest, can be working and not have sx. It seems to be completely irregular and unpredictable.  Brief, self resolves.  Occ stinging sensation.  He attributed it scar tissue from the prev  surgery.  Happens about a few times day.  Has been going on like that for about 6 months.  This is a completely different sensation from the sx prior to the CABG.   Edema:no Short of breath:no  He talked to [place] about treating a springtail infestation at home.  He's working on that.    PMH and SH reviewed  Meds, vitals, and allergies reviewed.   ROS: See HPI.  Otherwise negative.    GEN: nad, alert and oriented HEENT: mucous membranes moist NECK: supple w/o LA CV: rrr. PULM: ctab, no inc wob ABD: soft, +bs EXT: no edema SKIN: no acute rash  Diabetic foot exam: Normal inspection No skin breakdown No calluses  Normal DP pulses Normal sensation to light touch and monofilament Nails normal

## 2015-07-24 NOTE — Patient Instructions (Addendum)
Check with your insurance to see if they will cover the shingles shot. If the chest symptoms become consistent then let me now.   Don't change your meds for now.   Recheck A1c before a visit in about 6 months.   Take care.  Glad to see you.

## 2015-07-26 NOTE — Assessment & Plan Note (Signed)
Flu 2016  Shingles d/w pt.  PNA 2015  Tetanus 2010  Colonoscopy 2014  Prostate cancer screening- PSA 2016  Advance directive- d/w pt- wife or daughter equally designated if patient were incapacitated.  Cognitive function addressed- see scanned forms- and if abnormal then additional documentation follows.

## 2015-07-26 NOTE — Assessment & Plan Note (Signed)
A1c controlled, no ADE on med.  Continue as is.  He agrees.  Lipids improved.  Labs d/w pt.

## 2015-07-26 NOTE — Assessment & Plan Note (Signed)
This is completely atypical, not consistently exertional, irregular in onset, and totally different from his prev pain.  dw pt.  No pain at OV.  Has been going on for about 6 months per patient report.  He'll monitor the sx.  This doesn't sound likely to be cardiac and he agrees.  He'll update me and he'll keep his regular cardiac f/u appointment.  Still okay for outpatient f/u.

## 2015-08-25 DIAGNOSIS — H40011 Open angle with borderline findings, low risk, right eye: Secondary | ICD-10-CM | POA: Diagnosis not present

## 2015-08-25 DIAGNOSIS — E119 Type 2 diabetes mellitus without complications: Secondary | ICD-10-CM | POA: Diagnosis not present

## 2015-08-25 DIAGNOSIS — H1789 Other corneal scars and opacities: Secondary | ICD-10-CM | POA: Diagnosis not present

## 2015-08-25 DIAGNOSIS — B0052 Herpesviral keratitis: Secondary | ICD-10-CM | POA: Diagnosis not present

## 2015-08-25 DIAGNOSIS — H04123 Dry eye syndrome of bilateral lacrimal glands: Secondary | ICD-10-CM | POA: Diagnosis not present

## 2015-09-01 ENCOUNTER — Other Ambulatory Visit: Payer: Self-pay | Admitting: Family Medicine

## 2015-09-01 ENCOUNTER — Other Ambulatory Visit: Payer: Self-pay | Admitting: Cardiovascular Disease

## 2015-09-02 ENCOUNTER — Telehealth: Payer: Self-pay | Admitting: Family Medicine

## 2015-09-02 NOTE — Telephone Encounter (Signed)
Form is on your desk.

## 2015-09-02 NOTE — Telephone Encounter (Signed)
Pt dropped off handicap form for [facility]. Please call [phone] when ready to be picked up. Placing in rx tower  Thank you

## 2015-09-03 NOTE — Telephone Encounter (Signed)
Patient advised. Left at front desk for pickup. 

## 2015-09-03 NOTE — Telephone Encounter (Signed)
Done. Thanks.

## 2015-09-14 ENCOUNTER — Ambulatory Visit (INDEPENDENT_AMBULATORY_CARE_PROVIDER_SITE_OTHER): Payer: PPO | Admitting: Cardiovascular Disease

## 2015-09-14 ENCOUNTER — Encounter: Payer: Self-pay | Admitting: Cardiovascular Disease

## 2015-09-14 VITALS — BP 110/70 | HR 66 | Ht 67.0 in | Wt 172.4 lb

## 2015-09-14 DIAGNOSIS — I10 Essential (primary) hypertension: Secondary | ICD-10-CM | POA: Diagnosis not present

## 2015-09-14 DIAGNOSIS — R002 Palpitations: Secondary | ICD-10-CM | POA: Diagnosis not present

## 2015-09-14 DIAGNOSIS — I25810 Atherosclerosis of coronary artery bypass graft(s) without angina pectoris: Secondary | ICD-10-CM | POA: Diagnosis not present

## 2015-09-14 DIAGNOSIS — I480 Paroxysmal atrial fibrillation: Secondary | ICD-10-CM | POA: Diagnosis not present

## 2015-09-14 NOTE — Progress Notes (Signed)
   Chief Complaint  Patient presents with  . Follow-up  . Coronary Artery Disease    History of Present Illness: 74 yo male with history of CAD s/p CABG, HTN, HLD, DM who is here today for cardiac follow up. I saw him January 2015 for a second opinion on his chest pain and abnormal stress test. He was seen by Dr. Nahser 2 years ago for chest pain and underwent evaluation with a treadmill nuclear stress test as well as an echocardiogram. Both of them were unremarkable. Over the 3-4 months before his visit with me, he had been experiencing exertional substernal chest tightness and burning sensation. The pain occured when he was on the treadmill, starting in the left chest wall and radiating to his left shoulder and arm. He was seen by Dr. Arida in our [place] office 07/19/13 and had an exercise treadmill stress test with ST depression and chest pain. Cardiac cath was recommended but he wanted to think about this. I met him in January 2015 and arranged a cardiac cath on 08/21/13 and he was found to have severe three vessel CAD. He underwent 6V CABG 08/23/13 per Dr. Gerhardt. He has done well since then.  He has not tolerated statins in the past.   He is here today for follow up. No exertional chest pain. He does have pain across his chest wall with certain movements. No dyspnea. He is very active. He has noticed 6 episodes of palpitations over the last 6 months. He does not have it every day. The episodes last for 30 minutes and he feels his heart racing and skipping. No near syncope or syncope.   Primary Care Physician:  [facility]  Past Medical History  Diagnosis Date  . Diabetes mellitus   . Hypertension   . Chest pain   . Flank pain   . Diabetic neuropathy (HCC)   . Anxiety   . Hypercholesterolemia   . History of bronchitis   . History of chicken pox   . Heart murmur   . Kidney stones   . 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 Diverticulosis   . Osteoarthritis   . Fatty  liver   . Diverticulosis   . Family history of anesthesia complication     ' they cant wake my brother very easy"  . Coronary artery disease 08/21/2013    severe triple vessel  . Anginal pain (HCC)   . GERD (gastroesophageal reflux disease)     Past Surgical History  Procedure Laterality Date  . Hernia repair      X3  . Cholecystectomy  1983  . Cardiac catheterization  08/21/2013    DR MCALHANY  . Coronary artery bypass graft N/A 08/23/2013    Procedure: CORONARY ARTERY BYPASS GRAFTING (CABG);  Surgeon: Edward B Gerhardt, MD;  Location: [facility];  Service: Open Heart Surgery;  Laterality: N/A;  CABG times six utilizing the left internal mammary artery and the right greater saphenous vein harvested endoscopically  . Intraoperative transesophageal echocardiogram N/A 08/23/2013    Procedure: INTRAOPERATIVE TRANSESOPHAGEAL ECHOCARDIOGRAM;  Surgeon: Edward B Gerhardt, MD;  Location: [facility];  Service: Open Heart Surgery;  Laterality: N/A;  . Endovein harvest of greater saphenous vein Right 08/23/2013    Procedure: ENDOVEIN HARVEST OF GREATER SAPHENOUS VEIN;  Surgeon: Edward B Gerhardt, MD;  Location: [facility];  Service: Open Heart Surgery;  Laterality: Right;  . Left heart catheterization with coronary angiogram N/A 08/21/2013      Procedure: LEFT HEART CATHETERIZATION WITH CORONARY ANGIOGRAM;  Surgeon: Christopher D McAlhany, MD;  Location: [facility];  Service: Cardiovascular;  Laterality: N/A;    Current Outpatient Prescriptions  Medication Sig Dispense Refill  . acyclovir (ZOVIRAX) 800 MG tablet Take 1 tablet (800 mg total) by mouth 2 (two) times daily. 180 tablet 3  . aspirin EC 81 MG tablet Take 1 tablet (81 mg total) by mouth daily.    . busPIRone (BUSPAR) 10 MG tablet TAKE 0.5-1 TABLETS BY MOUTH 3 TIMES DAILY AS NEEDED FOR ANXIETY 30 tablet 3  . fenofibrate 160 MG tablet Take 1 tablet (160 mg total) by mouth at bedtime. 90 tablet 3  . gabapentin (NEURONTIN) 600 MG tablet Take 1 tablet (600 mg  total) by mouth 3 (three) times daily. 270 tablet 3  . losartan (COZAAR) 50 MG tablet Take 1 tablet (50 mg total) by mouth daily. 90 tablet 3  . metFORMIN (GLUCOPHAGE) 850 MG tablet Take 1 tablet (850 mg total) by mouth 2 (two) times daily with a meal. 180 tablet 0  . metoprolol tartrate (LOPRESSOR) 25 MG tablet TAKE 0.5 TABLETS (12.5 MG TOTAL) BY MOUTH 2 (TWO) TIMES DAILY. 90 tablet 1  . nitroGLYCERIN (NITROSTAT) 0.4 MG SL tablet Place 1 tablet (0.4 mg total) under the tongue every 5 (five) minutes as needed for chest pain (max 3 doses). 25 tablet 6   No current facility-administered medications for this visit.    Allergies  Allergen Reactions  . Aspirin     Bleeding at high dose  . Statins     Other reaction(s): Other (See Comments) Other Reaction: MYALGIA Muscle pain    Social History   Social History  . Marital Status: Married    Spouse Name: N/A  . Number of Children: N/A  . Years of Education: N/A   Occupational History  . Retired    Social History Main Topics  . Smoking status: Never Smoker   . Smokeless tobacco: Never Used  . Alcohol Use: No  . Drug Use: No  . Sexual Activity: Not on file   Other Topics Concern  . 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 Stroke Mother   . Heart disease Father     heart failure  . Parkinsonism Maternal Grandfather   . Colon cancer Maternal Grandmother   . Prostate cancer Maternal Uncle   . Stroke Paternal Grandfather   . CAD Brother     CABG  . Diabetes Mother     Review of Systems:  As stated in the HPI and otherwise negative.   BP 110/70 mmHg  Pulse 66  Ht 5' 7" (1.702 m)  Wt 172 lb 6.4 oz (78.2 kg)  BMI 27.00 kg/m2  Physical Examination: General: Well developed, well nourished, NAD HEENT: OP clear, mucus membranes  moist SKIN: warm, dry. No rashes. Neuro: No focal deficits Musculoskeletal: Muscle strength 5/5 all ext Psychiatric: Mood and affect normal Neck: No JVD, no carotid bruits, no thyromegaly, no lymphadenopathy. Lungs:Clear bilaterally, no wheezes, rhonci, crackles Cardiovascular: Regular rate and rhythm. Systolic murmur. No gallops or rubs. Abdomen:Soft. Bowel sounds present. Non-tender.  Extremities: No lower extremity edema. Pulses are 2 + in the bilateral DP/PT.  Cardiac cath 08/21/13: Left main: No obstructive disease.    Left Anterior Descending Artery: Large caliber vessel that courses to the apex and gives off a moderate caliber diagonal branch. The mid LAD has 50% stenosis followed by a focal 70-80% stenosis. The distal vessel has diffuse disease with serial 90% stenoses just before the vessel wraps around the apex. The diagonal branch is moderate in caliber with 99% stenosis in the mid vessel just before a bifurcation.  Circumflex Artery: Large caliber vessel with 99% proximal stenosis. The mid vessel has diffuse 40% stenosis. There is a moderate to large caliber obtuse marginal branch that arises distally with 80% proximal stenosis. The AV groove Circumflex beyond the takeoff of the OM branch has 99% stenosis.  Right Coronary Artery: Large caliber, dominant vessel with 100% proximal occlusion after a large conus branch that supplies a portion of the RV wall. The proximal, mid and distal vessel fills from left to right collaterals.  Left Ventricular Angiogram: LVEF=60%.   EKG:  EKG is not ordered today The ekg ordered today demonstrates    Recent Labs: 02/17/2015: ALT 16; BUN 21; Creatinine, Ser 1.04; Hemoglobin 14.5; Platelets 172.0; Potassium 4.4; Sodium 141; TSH 0.88   Lipid Panel    Component Value Date/Time   CHOL 129 07/17/2015 0910   TRIG 78.0 07/17/2015 0910   HDL 36.80* 07/17/2015 0910   CHOLHDL 4 07/17/2015 0910   VLDL 15.6 07/17/2015 0910   LDLCALC 77 07/17/2015 0910    LDLDIRECT 79.8 07/08/2013 0906     Wt Readings from Last 3 Encounters:  09/14/15 172 lb 6.4 oz (78.2 kg)  07/24/15 169 lb 12 oz (76.998 kg)  03/02/15 163 lb (73.936 kg)     Other studies Reviewed: Additional studies/ records that were reviewed today include: . Review of the above records demonstrates:    Assessment and Plan:   1. CAD:  He is now s/p 6V CABG in January 2015. Stable. Continue current medical therapy with ASA and Lopressor. He is intolerant of statins and unwilling to try again. His chest pain is likely chest wall pain post bypass.   2. HTN: BP is controlled. No changes.   3. Palpitations/History of Post-op atrial fib: He appears to be in sinus today. He is having palpitations. Will arrange 30 day event monitor since episodes are only once every two weeks. Will continue ASA.   Current medicines are reviewed at length with the patient today.  The patient does not have concerns regarding medicines.  The following changes have been made:  no change  Labs/ tests ordered today include:   Orders Placed This Encounter  Procedures  . Cardiac event monitor    Disposition:   FU with me in 6 months  Signed, Christopher McAlhany, MD 09/14/2015 9:19 AM    [facility] [address], [place], [place]  [address] Phone: [phone]; Fax: [phone]    

## 2015-09-14 NOTE — Patient Instructions (Addendum)
Medication Instructions:  Your physician recommends that you continue on your current medications as directed. Please refer to the Current Medication list given to you today.   Labwork: none  Testing/Procedures: Your physician has recommended that you wear an event monitor. Event monitors are medical devices that record the heart's electrical activity. Doctors most often [place] these monitors to diagnose arrhythmias. Arrhythmias are problems with the speed or rhythm of the heartbeat. The monitor is a small, portable device. You can wear one while you do your normal daily activities. This is usually used to diagnose what is causing palpitations/syncope (passing out).    Follow-Up: Your physician recommends that you schedule a follow-up appointment in: 6-8 weeks. --Scheduled for March 20,2017 at 9:15   Any Other Special Instructions Will Be Listed Below (If Applicable).     If you need a refill on your cardiac medications before your next appointment, please call your pharmacy.

## 2015-09-17 ENCOUNTER — Ambulatory Visit (INDEPENDENT_AMBULATORY_CARE_PROVIDER_SITE_OTHER): Payer: PPO

## 2015-09-17 DIAGNOSIS — R002 Palpitations: Secondary | ICD-10-CM

## 2015-10-02 ENCOUNTER — Telehealth: Payer: Self-pay

## 2015-10-02 MED ORDER — METFORMIN HCL 850 MG PO TABS: 850.0000 mg | ORAL_TABLET | Freq: Two times a day (BID) | ORAL | Status: DC

## 2015-10-02 NOTE — Telephone Encounter (Signed)
Patient advised.   Appts scheduled. 

## 2015-10-02 NOTE — Telephone Encounter (Signed)
[name] [name] [phone]  [facility]  [place] stop by and he needs a refill on his metformin HCL 850 mg tablet

## 2015-10-02 NOTE — Telephone Encounter (Signed)
Last filled by Endo 06/2015---pt had annual 07/2015 with you please advise

## 2015-10-02 NOTE — Telephone Encounter (Signed)
Sent.  Thanks.  Recheck A1c here before OV in summer 2017.

## 2015-10-26 ENCOUNTER — Encounter: Payer: Self-pay | Admitting: Cardiovascular Disease

## 2015-10-26 ENCOUNTER — Ambulatory Visit (INDEPENDENT_AMBULATORY_CARE_PROVIDER_SITE_OTHER): Payer: PPO | Admitting: Cardiovascular Disease

## 2015-10-26 VITALS — BP 144/74 | HR 68 | Ht 67.0 in | Wt 173.8 lb

## 2015-10-26 DIAGNOSIS — I25810 Atherosclerosis of coronary artery bypass graft(s) without angina pectoris: Secondary | ICD-10-CM

## 2015-10-26 DIAGNOSIS — I480 Paroxysmal atrial fibrillation: Secondary | ICD-10-CM

## 2015-10-26 DIAGNOSIS — I10 Essential (primary) hypertension: Secondary | ICD-10-CM

## 2015-10-26 NOTE — Patient Instructions (Signed)

## 2015-10-26 NOTE — Progress Notes (Signed)
Chief Complaint  Patient presents with  . Palpitations    History of Present Illness: 75 yo male with history of CAD s/p CABG, HTN, HLD, DM who is here today for cardiac follow up. I saw him January 2015 for a second opinion on his chest pain and abnormal stress test. He was seen by Dr. Acie Fredrickson 2 years ago for chest pain and underwent evaluation with a treadmill nuclear stress test as well as an echocardiogram. Both of them were unremarkable. Over the 3-4 months before his visit with me, he had been experiencing exertional substernal chest tightness and burning sensation. The pain occured when he was on the treadmill, starting in the left chest wall and radiating to his left shoulder and arm. He was seen by Dr. Fletcher Anon in our [facility] office 07/19/13 and had an exercise treadmill stress test with ST depression and chest pain. Cardiac cath was recommended but he wanted to think about this. I met him in January 2015 and arranged a cardiac cath on 08/21/13 and he was found to have severe three vessel CAD. He underwent 6V CABG 08/23/13 per Dr. Servando Snare. He has done well since then.  He has not tolerated statins in the past. At his last visit here, he complained about episodes of palpitations. 30 day event monitor February 2017 with sinus, PACs.   He is here today for follow up. No exertional chest pain. He does have pain across his chest wall with certain movements. No dyspnea. He is very active. No near syncope or syncope.   Primary Care Physician:  [name], MD   Past Medical History  Diagnosis Date  . Diabetes mellitus   . Hypertension   . Chest pain   . Flank pain   . Diabetic neuropathy ([place])   . Anxiety   . Hypercholesterolemia   . History of bronchitis   . History of chicken pox   . Heart murmur   . Kidney stones   . 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 Diverticulosis   . Osteoarthritis   . Fatty liver   . Diverticulosis   . Family history of anesthesia  complication     ' they cant wake my brother very easy"  . Coronary artery disease 08/21/2013    severe triple vessel  . Anginal pain ([place])   . GERD (gastroesophageal reflux disease)     Past Surgical History  Procedure Laterality Date  . Hernia repair      X3  . Cholecystectomy  1983  . Cardiac catheterization  08/21/2013    DR Angelena Form  . Coronary artery bypass graft N/A 08/23/2013    Procedure: CORONARY ARTERY BYPASS GRAFTING (CABG);  Surgeon: Grace Isaac, MD;  Location: [facility];  Service: Open Heart Surgery;  Laterality: N/A;  CABG times six utilizing the left internal mammary artery and the right greater saphenous vein harvested endoscopically  . Intraoperative transesophageal echocardiogram N/A 08/23/2013    Procedure: INTRAOPERATIVE TRANSESOPHAGEAL ECHOCARDIOGRAM;  Surgeon: Grace Isaac, MD;  Location: [facility];  Service: Open Heart Surgery;  Laterality: N/A;  . Endovein harvest of greater saphenous vein Right 08/23/2013    Procedure: ENDOVEIN HARVEST OF GREATER SAPHENOUS VEIN;  Surgeon: Grace Isaac, MD;  Location: [facility];  Service: Open Heart Surgery;  Laterality: Right;  . Left heart catheterization with coronary angiogram N/A 08/21/2013    Procedure: LEFT HEART CATHETERIZATION WITH CORONARY ANGIOGRAM;  Surgeon: Burnell Blanks, MD;  Location: [facility] CATH LAB;  Service: Cardiovascular;  Laterality: N/A;    Current Outpatient Prescriptions  Medication Sig Dispense Refill  . acyclovir (ZOVIRAX) 800 MG tablet Take 1 tablet (800 mg total) by mouth 2 (two) times daily. 180 tablet 3  . aspirin EC 81 MG tablet Take 1 tablet (81 mg total) by mouth daily.    . busPIRone (BUSPAR) 10 MG tablet TAKE 0.5-1 TABLETS BY MOUTH 3 TIMES DAILY AS NEEDED FOR ANXIETY 30 tablet 3  . fenofibrate 160 MG tablet Take 1 tablet (160 mg total) by mouth at bedtime. 90 tablet 3  . gabapentin (NEURONTIN) 600 MG tablet Take 1 tablet (600 mg total) by mouth 3 (three) times daily. 270 tablet 3  .  losartan (COZAAR) 50 MG tablet Take 1 tablet (50 mg total) by mouth daily. 90 tablet 3  . metFORMIN (GLUCOPHAGE) 850 MG tablet Take 1 tablet (850 mg total) by mouth 2 (two) times daily with a meal. 180 tablet 3  . metoprolol tartrate (LOPRESSOR) 25 MG tablet TAKE 0.5 TABLETS (12.5 MG TOTAL) BY MOUTH 2 (TWO) TIMES DAILY. 90 tablet 1  . nitroGLYCERIN (NITROSTAT) 0.4 MG SL tablet Place 1 tablet (0.4 mg total) under the tongue every 5 (five) minutes as needed for chest pain (max 3 doses). 25 tablet 6   No current facility-administered medications for this visit.    Allergies  Allergen Reactions  . Aspirin     Bleeding at high dose  . Statins     Other reaction(s): Other (See Comments) Other Reaction: MYALGIA Muscle pain    Social History   Social History  . Marital Status: Married    Spouse Name: N/A  . Number of Children: N/A  . Years of Education: N/A   Occupational History  . Retired    Social History Main Topics  . Smoking status: Never Smoker   . Smokeless tobacco: Never Used  . Alcohol Use: No  . Drug Use: No  . Sexual Activity: Not on file   Other Topics Concern  . 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 Stroke Mother   . Heart disease Father     heart failure  . Parkinsonism Maternal Grandfather   . Colon cancer Maternal Grandmother   . Prostate cancer Maternal Uncle   . Stroke Paternal Grandfather   . CAD Brother     CABG  . Diabetes Mother     Review of Systems:  As stated in the HPI and otherwise negative.   BP 144/74 mmHg  Pulse 68  Ht 5' 7"  (1.702 m)  Wt 173 lb 12.8 oz (78.835 kg)  BMI 27.21 kg/m2  Physical Examination: General: Well developed, well nourished, NAD HEENT: OP clear, mucus membranes moist SKIN: warm, dry. No rashes. Neuro: No focal  deficits Musculoskeletal: Muscle strength 5/5 all ext Psychiatric: Mood and affect normal Neck: No JVD, no carotid bruits, no thyromegaly, no lymphadenopathy. Lungs:Clear bilaterally, no wheezes, rhonci, crackles Cardiovascular: Regular rate and rhythm. Systolic murmur. No gallops or rubs. Abdomen:Soft. Bowel sounds present. Non-tender.  Extremities: No lower extremity edema. Pulses are 2 + in the bilateral DP/PT.  Cardiac cath 08/21/13: Left main: No obstructive disease.  Left Anterior Descending Artery: Large caliber vessel that courses to the apex and gives  off a moderate caliber diagonal branch. The mid LAD has 50% stenosis followed by a focal 70-80% stenosis. The distal vessel has diffuse disease with serial 90% stenoses just before the vessel wraps around the apex. The diagonal branch is moderate in caliber with 99% stenosis in the mid vessel just before a bifurcation.  Circumflex Artery: Large caliber vessel with 99% proximal stenosis. The mid vessel has diffuse 40% stenosis. There is a moderate to large caliber obtuse marginal branch that arises distally with 80% proximal stenosis. The AV groove Circumflex beyond the takeoff of the OM branch has 99% stenosis.  Right Coronary Artery: Large caliber, dominant vessel with 100% proximal occlusion after a large conus branch that supplies a portion of the RV wall. The proximal, mid and distal vessel fills from left to right collaterals.  Left Ventricular Angiogram: LVEF=60%.   EKG:  EKG is not ordered today The ekg ordered today demonstrates    Recent Labs: 02/17/2015: ALT 16; BUN 21; Creatinine, Ser 1.04; Hemoglobin 14.5; Platelets 172.0; Potassium 4.4; Sodium 141; TSH 0.88   Lipid Panel    Component Value Date/Time   CHOL 129 07/17/2015 0910   TRIG 78.0 07/17/2015 0910   HDL 36.80* 07/17/2015 0910   CHOLHDL 4 07/17/2015 0910   VLDL 15.6 07/17/2015 0910   LDLCALC 77 07/17/2015 0910   LDLDIRECT 79.8 07/08/2013 0906     Wt Readings  from Last 3 Encounters:  10/26/15 173 lb 12.8 oz (78.835 kg)  09/14/15 172 lb 6.4 oz (78.2 kg)  07/24/15 169 lb 12 oz (76.998 kg)     Other studies Reviewed: Additional studies/ records that were reviewed today include: . Review of the above records demonstrates:    Assessment and Plan:   1. CAD:  He is now s/p 6V CABG in January 2015. Stable with no recent chest pain c/w angina. Continue current medical therapy with ASA and Lopressor. He is intolerant of statins and unwilling to try again. TG controlled on fenofibrate.   2. HTN: BP is controlled. No changes.    3. Palpitations/History of Post-op atrial fib: He appears to be in sinus today. 30 day event monitor with sinus, PACs. Will continue ASA.   Current medicines are reviewed at length with the patient today.  The patient does not have concerns regarding medicines.  The following changes have been made:  no change  Labs/ tests ordered today include:   No orders of the defined types were placed in this encounter.    Disposition:   FU with me in 6 months  Signed, Lauree Chandler, MD 10/26/2015 9:55 AM    [place], [place], [place]  [address] Phone: [phone]; Fax: [phone]

## 2015-12-17 ENCOUNTER — Other Ambulatory Visit: Payer: Self-pay | Admitting: Cardiovascular Disease

## 2015-12-29 DIAGNOSIS — H40011 Open angle with borderline findings, low risk, right eye: Secondary | ICD-10-CM | POA: Diagnosis not present

## 2015-12-29 DIAGNOSIS — H2512 Age-related nuclear cataract, left eye: Secondary | ICD-10-CM | POA: Diagnosis not present

## 2015-12-29 DIAGNOSIS — B0052 Herpesviral keratitis: Secondary | ICD-10-CM | POA: Diagnosis not present

## 2015-12-29 LAB — HM DIABETES EYE EXAM

## 2016-01-01 ENCOUNTER — Other Ambulatory Visit: Payer: Self-pay | Admitting: Cardiovascular Disease

## 2016-01-26 ENCOUNTER — Other Ambulatory Visit (INDEPENDENT_AMBULATORY_CARE_PROVIDER_SITE_OTHER): Payer: PPO

## 2016-01-26 DIAGNOSIS — E1149 Type 2 diabetes mellitus with other diabetic neurological complication: Secondary | ICD-10-CM | POA: Diagnosis not present

## 2016-01-26 DIAGNOSIS — E1140 Type 2 diabetes mellitus with diabetic neuropathy, unspecified: Secondary | ICD-10-CM | POA: Diagnosis not present

## 2016-01-26 LAB — HEMOGLOBIN A1C: Hgb A1c MFr Bld: 6.5 % (ref 4.6–6.5)

## 2016-02-01 ENCOUNTER — Encounter: Payer: Self-pay | Admitting: Family Medicine

## 2016-02-01 ENCOUNTER — Ambulatory Visit (INDEPENDENT_AMBULATORY_CARE_PROVIDER_SITE_OTHER): Payer: PPO | Admitting: Family Medicine

## 2016-02-01 VITALS — BP 126/68 | HR 63 | Temp 97.5°F | Wt 173.2 lb

## 2016-02-01 DIAGNOSIS — L570 Actinic keratosis: Secondary | ICD-10-CM | POA: Diagnosis not present

## 2016-02-01 DIAGNOSIS — E1149 Type 2 diabetes mellitus with other diabetic neurological complication: Secondary | ICD-10-CM | POA: Diagnosis not present

## 2016-02-01 DIAGNOSIS — H40009 Preglaucoma, unspecified, unspecified eye: Secondary | ICD-10-CM | POA: Diagnosis not present

## 2016-02-01 DIAGNOSIS — R938 Abnormal findings on diagnostic imaging of other specified body structures: Secondary | ICD-10-CM | POA: Diagnosis not present

## 2016-02-01 DIAGNOSIS — H579 Unspecified disorder of eye and adnexa: Secondary | ICD-10-CM

## 2016-02-01 NOTE — Patient Instructions (Addendum)
[name] [name] or [name] [name] will call about your referral. Recheck labs in about 6 months before a physical.   If you have frequent low sugars, then let me know.  Take care.  Glad to see you.

## 2016-02-01 NOTE — Progress Notes (Signed)
Pre visit review using our clinic review tool, if applicable. No additional management support is needed unless otherwise documented below in the visit note.  Diabetes:  Using medications without difficulties: yes Hypoglycemic episodes: rare episodes, if prolonged fasting.   Hyperglycemic episodes: no Feet problems: still with plantar fasciitis and neuropathy at baseline.  Gabapentin helps.   Blood Sugars averaging: usually not checked.   eye exam within last year: needs glaucoma eye doc set up.  D/w pt.  He had an abnormal eye exam out of town.  Suspect for glaucoma. A1c d/w pt.     Doing frequent exercise.  Diet and exercise d/w pt.  Doing well.  No chest pain.    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   Meds, vitals, and allergies reviewed.   ROS: Per HPI unless specifically indicated in ROS section   GEN: nad, alert and oriented HEENT: mucous membranes moist NECK: supple w/o LA CV: rrr. 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    Diabetic foot exam: Normal inspection No skin breakdown No calluses  Normal DP pulses Normal sensation to light touch and monofilament but neuropathy hx noted- sensation intact on exam.   Nails thickened

## 2016-02-02 DIAGNOSIS — H40009 Preglaucoma, unspecified, unspecified eye: Secondary | ICD-10-CM | POA: Insufficient documentation

## 2016-02-02 DIAGNOSIS — L570 Actinic keratosis: Secondary | ICD-10-CM | POA: Insufficient documentation

## 2016-02-02 NOTE — Assessment & Plan Note (Signed)
A1c d/w pt, at goal.    Doing frequent exercise.  Diet and exercise d/w pt.  Doing well.  No chest pain.   Recheck in a few months.  He agrees.   No med changes.  >25 minutes spent in face to face time with patient, >50% spent in counselling or coordination of care.

## 2016-02-02 NOTE — Assessment & Plan Note (Signed)
Refer

## 2016-02-02 NOTE — Assessment & Plan Note (Signed)
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   F/u prn.  Routine advice given to patient.

## 2016-02-12 ENCOUNTER — Telehealth: Payer: Self-pay | Admitting: Family Medicine

## 2016-02-12 ENCOUNTER — Ambulatory Visit: Payer: PPO | Admitting: Family Medicine

## 2016-02-12 DIAGNOSIS — Z0289 Encounter for other administrative examinations: Secondary | ICD-10-CM

## 2016-02-12 NOTE — Telephone Encounter (Signed)
Patient did not come for their scheduled appointment today for growth on right foot.  Please let me know if the patient needs to be contacted immediately for follow up or if no follow up is necessary.

## 2016-02-13 NOTE — Telephone Encounter (Signed)
Please either call or send a letter.  Thanks.

## 2016-02-15 NOTE — Telephone Encounter (Signed)
Spoke with pt wife. She said he forgot all about appt. Pt not in house right now and will call back to reschedule.

## 2016-02-17 ENCOUNTER — Encounter: Payer: Self-pay | Admitting: Family Medicine

## 2016-02-17 ENCOUNTER — Ambulatory Visit (INDEPENDENT_AMBULATORY_CARE_PROVIDER_SITE_OTHER): Payer: PPO | Admitting: Family Medicine

## 2016-02-17 VITALS — BP 140/70 | HR 60 | Temp 97.9°F | Ht 67.0 in | Wt 168.8 lb

## 2016-02-17 DIAGNOSIS — M722 Plantar fascial fibromatosis: Secondary | ICD-10-CM

## 2016-02-17 DIAGNOSIS — K6289 Other specified diseases of anus and rectum: Secondary | ICD-10-CM

## 2016-02-17 DIAGNOSIS — E1149 Type 2 diabetes mellitus with other diabetic neurological complication: Secondary | ICD-10-CM

## 2016-02-17 NOTE — Progress Notes (Signed)
Pre visit review using our clinic review tool, if applicable. No additional management support is needed unless otherwise documented below in the visit note.  Stinging with BM.  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  This feels different, circumferential and different from the prev fissure pain.  No blood in stool, except then the fissure flares up.   New pain is noted only in the AM.   Foot complaint.  R foot, new plantar side of R foot has a knot on the arch.  Similar lesion on the L foot, that one was long standing.    He has h/o plantar fasciitis.  D/w pt about possible nighttime splints.  He wanted to avoid for now.   DM2.  Last A1c was 6.5. D/w pt about labs and diet and weight.  Still on metformin.  No ade on med.    PMH and SH reviewed  ROS: Per HPI unless specifically indicated in ROS section   Meds, vitals, and allergies reviewed.   GEN: nad, alert and oriented HEENT: mucous membranes moist NECK: supple w/o LA CV: rrr.  PULM: ctab, no inc wob ABD: soft, +bs EXT: no edema SKIN: no acute rash B plantar exam with soft mass noted along the plantar fascia, not inflamed.   Rectal exam w/o gross blood, mass, or lesion.

## 2016-02-17 NOTE — Telephone Encounter (Signed)
Scheduled 7/12

## 2016-02-17 NOTE — Patient Instructions (Signed)
Let me know if you need the plantar splints for the plantar fasciitis.   If the rectal pain is getting worse then we can set you up with GI.  Recheck labs before a visit in about 6 months.  Take care.  Glad to see you.

## 2016-02-18 ENCOUNTER — Encounter: Payer: Self-pay | Admitting: Family Medicine

## 2016-02-18 DIAGNOSIS — M722 Plantar fascial fibromatosis: Secondary | ICD-10-CM | POA: Insufficient documentation

## 2016-02-18 DIAGNOSIS — K6289 Other specified diseases of anus and rectum: Secondary | ICD-10-CM | POA: Insufficient documentation

## 2016-02-18 NOTE — Assessment & Plan Note (Signed)
D/w pt about stretching and we can get him nighttime splints if needed.   The masses appear to be near the fascia and may be result of chronic thickening thereof.  Wouldn't intervene o/w.  He agrees.  No contracture.   If more painful, then we can address.  Continue arch support in shoes.  He agrees.

## 2016-02-18 NOTE — Assessment & Plan Note (Signed)
Likely from excessive sphincter tone in the AM after sleeping.  No mass seen.  No fissure seen now.  He'll observe and f/u with GI if sx continue.  He agrees.

## 2016-02-18 NOTE — Assessment & Plan Note (Signed)
A1c at goal, continue med, diet, exercise, recheck in about 6 months.  He agrees.

## 2016-05-23 DIAGNOSIS — H401130 Primary open-angle glaucoma, bilateral, stage unspecified: Secondary | ICD-10-CM | POA: Diagnosis not present

## 2016-05-23 LAB — HM DIABETES EYE EXAM

## 2016-05-30 ENCOUNTER — Encounter: Payer: Self-pay | Admitting: Family Medicine

## 2016-06-14 NOTE — Progress Notes (Signed)
Chief Complaint  Patient presents with  . Palpitations    History of Present Illness: 75 yo male with history of CAD s/p CABG, HTN, HLD, DM who is here today for cardiac follow up. I saw him January 2015 for a second opinion on his chest pain and abnormal stress test. He was seen by Dr. Acie Fredrickson 2 years ago for chest pain and underwent evaluation with a treadmill nuclear stress test as well as an echocardiogram. Both of them were unremarkable. Over the 3-4 months before his visit with me, he had been experiencing exertional substernal chest tightness and burning sensation. The pain occured when he was on the treadmill, starting in the left chest wall and radiating to his left shoulder and arm. He was seen by Dr. Fletcher Anon in our [facility] office 07/19/13 and had an exercise treadmill stress test with ST depression and chest pain. Cardiac cath was recommended but he wanted to think about this. I met him in January 2015 and arranged a cardiac cath on 08/21/13 and he was found to have severe three vessel CAD. He underwent 6V CABG 08/23/13 per Dr. Servando Snare. He has done well since then.  He has not tolerated statins in the past. In the spring of 2017, he complained about palpitations. 30 day event monitor February 2017 with sinus, PACs.   He is here today for follow up. No exertional chest pain. No dyspnea. No near syncope or syncope. He is exercising 3-5 days per week.   Primary Care Physician:  [name], MD   Past Medical History:  Diagnosis Date  . Anginal pain ([place])   . Anxiety   . Chest pain   . 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 Diabetes mellitus   . Diabetic neuropathy ([place])   . Diverticulosis   . Diverticulosis   . Family history of anesthesia complication    ' they cant wake my brother very easy"  . Fatty liver   . Flank pain   . GERD (gastroesophageal reflux disease)   . Heart murmur   . History of bronchitis   . History of chicken pox   . 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 Hypercholesterolemia   . Hypertension   . Kidney stones   . Osteoarthritis     Past Surgical History:  Procedure Laterality Date  . CARDIAC CATHETERIZATION  08/21/2013   DR Angelena Form  . CHOLECYSTECTOMY  1983  . CORONARY ARTERY BYPASS GRAFT N/A 08/23/2013   Procedure: CORONARY ARTERY BYPASS GRAFTING (CABG);  Surgeon: Grace Isaac, MD;  Location: [place];  Service: Open Heart Surgery;  Laterality: N/A;  CABG times six utilizing the left internal mammary artery and the right greater saphenous vein harvested endoscopically  . ENDOVEIN HARVEST OF GREATER SAPHENOUS VEIN Right 08/23/2013   Procedure: ENDOVEIN HARVEST OF GREATER SAPHENOUS VEIN;  Surgeon: Grace Isaac, MD;  Location: [place];  Service: Open Heart Surgery;  Laterality: Right;  . HERNIA REPAIR     X3  . INTRAOPERATIVE TRANSESOPHAGEAL ECHOCARDIOGRAM N/A 08/23/2013   Procedure: INTRAOPERATIVE TRANSESOPHAGEAL ECHOCARDIOGRAM;  Surgeon: Grace Isaac, MD;  Location: [place];  Service: Open Heart Surgery;  Laterality: N/A;  . LEFT HEART CATHETERIZATION WITH CORONARY ANGIOGRAM N/A 08/21/2013   Procedure: LEFT HEART CATHETERIZATION WITH CORONARY ANGIOGRAM;  Surgeon: Burnell Blanks, MD;  Location: [facility] CATH LAB;  Service: Cardiovascular;  Laterality: N/A;    Current Outpatient Prescriptions  Medication Sig  Dispense Refill  . acyclovir (ZOVIRAX) 800 MG tablet Take 1 tablet (800 mg total) by mouth 2 (two) times daily. 180 tablet 3  . aspirin EC 81 MG tablet Take 1 tablet (81 mg total) by mouth daily.    . fenofibrate 160 MG tablet Take 1 tablet (160 mg total) by mouth at bedtime. 90 tablet 3  . gabapentin (NEURONTIN) 600 MG tablet Take 1 tablet (600 mg total) by mouth 3 (three) times daily. 270 tablet 3  . losartan (COZAAR) 50 MG tablet Take 1 tablet (50 mg total) by mouth daily. 90 tablet 3  . metFORMIN (GLUCOPHAGE) 850 MG tablet Take 1 tablet (850 mg total) by mouth 2 (two) times  daily with a meal. 180 tablet 3  . metoprolol tartrate (LOPRESSOR) 25 MG tablet TAKE 0.5 TABLETS (12.5 MG TOTAL) BY MOUTH 2 (TWO) TIMES DAILY. 90 tablet 2  . nitroGLYCERIN (NITROSTAT) 0.4 MG SL tablet Place 1 tablet (0.4 mg total) under the tongue every 5 (five) minutes as needed for chest pain (max 3 doses). 25 tablet 6   No current facility-administered medications for this visit.     Allergies  Allergen Reactions  . Aspirin     Bleeding at high dose  . Statins     Other reaction(s): Other (See Comments) Other Reaction: MYALGIA Muscle pain    Social History   Social History  . Marital status: Married    Spouse name: N/A  . Number of children: N/A  . Years of education: N/A   Occupational History  . Retired    Social History Main Topics  . Smoking status: Never Smoker  . Smokeless tobacco: Never Used  . Alcohol use No  . Drug use: No  . Sexual activity: Not on file   Other Topics Concern  . 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 Stroke Mother   . Heart disease Father     heart failure  . Parkinsonism Maternal Grandfather   . Colon cancer Maternal Grandmother   . Prostate cancer Maternal Uncle   . Stroke Paternal Grandfather   . CAD Brother     CABG  . Diabetes Mother     Review of Systems:  As stated in the HPI and otherwise negative.   BP 130/80   Pulse (!) 53   Resp 12   Ht _0  (1.727 m)   Wt 170 lb (77.1 kg)   BMI 25.85 kg/m   Physical Examination: General: Well developed, well nourished, NAD  HEENT: OP clear, mucus membranes moist  SKIN: warm, dry. No rashes. Neuro: No focal deficits  Musculoskeletal: Muscle strength 5/5 all ext  Psychiatric: Mood and affect normal  Neck: No JVD, no carotid bruits, no thyromegaly, no lymphadenopathy.  Lungs:Clear  bilaterally, no wheezes, rhonci, crackles Cardiovascular: Regular rate and rhythm. Systolic murmur. No gallops or rubs. Abdomen:Soft. Bowel sounds present. Non-tender.  Extremities: No lower extremity edema. Pulses are 2 + in the bilateral DP/PT.  Cardiac cath 08/21/13: Left main: No obstructive disease.  Left Anterior Descending Artery: Large caliber vessel that courses to the apex and gives off a moderate caliber diagonal branch. The mid LAD has 50% stenosis followed by a focal 70-80% stenosis. The distal vessel has diffuse disease with serial 90% stenoses  just before the vessel wraps around the apex. The diagonal branch is moderate in caliber with 99% stenosis in the mid vessel just before a bifurcation.  Circumflex Artery: Large caliber vessel with 99% proximal stenosis. The mid vessel has diffuse 40% stenosis. There is a moderate to large caliber obtuse marginal branch that arises distally with 80% proximal stenosis. The AV groove Circumflex beyond the takeoff of the OM branch has 99% stenosis.  Right Coronary Artery: Large caliber, dominant vessel with 100% proximal occlusion after a large conus branch that supplies a portion of the RV wall. The proximal, mid and distal vessel fills from left to right collaterals.  Left Ventricular Angiogram: LVEF=60%.   EKG:  EKG is ordered today The ekg ordered today demonstrates  NSR, rate 53 bpm. 1st degree AV block. Non-specific ST abnormality.   Recent Labs: No results found for requested labs within last 8760 hours.   Lipid Panel    Component Value Date/Time   CHOL 129 07/17/2015 0910   TRIG 78.0 07/17/2015 0910   HDL 36.80 (L) 07/17/2015 0910   CHOLHDL 4 07/17/2015 0910   VLDL 15.6 07/17/2015 0910   LDLCALC 77 07/17/2015 0910   LDLDIRECT 79.8 07/08/2013 0906     Wt Readings from Last 3 Encounters:  06/15/16 170 lb (77.1 kg)  02/17/16 168 lb 12 oz (76.5 kg)  02/01/16 173 lb 4 oz (78.6 kg)     Other studies Reviewed: Additional  studies/ records that were reviewed today include: . Review of the above records demonstrates:    Assessment and Plan:   1. CAD:  He is now s/p 6V CABG in January 2015. Stable with no recent chest pain c/w angina. Continue current medical therapy with ASA and Lopressor. He is intolerant of statins and unwilling to try again. TG controlled on fenofibrate. Will arrange echo next summer to assess LVEF, valve disease. Mild AI by echo in 2015.   2. HTN: BP is controlled. No changes.    3. Palpitations/History of Post-op atrial fib: He is in sinus today. 30 day event monitor with sinus, PACs. Will continue ASA and beta blocker.  4. 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 Repeat echo next summer.   Current medicines are reviewed at length with the patient today.  The patient does not have concerns regarding medicines.  The following changes have been made:  no change  Labs/ tests ordered today include:   Orders Placed This Encounter  Procedures  . ECHOCARDIOGRAM COMPLETE    Disposition:   FU with me in 6 months  Signed, Lauree Chandler, MD 06/15/2016 9:58 AM    [facility] [place], [place], [place]  [address] Phone: [phone]; Fax: [phone]

## 2016-06-15 ENCOUNTER — Ambulatory Visit (INDEPENDENT_AMBULATORY_CARE_PROVIDER_SITE_OTHER): Payer: PPO | Admitting: Cardiovascular Disease

## 2016-06-15 VITALS — BP 130/80 | HR 53 | Resp 12 | Ht 68.0 in | Wt 170.0 lb

## 2016-06-15 DIAGNOSIS — R002 Palpitations: Secondary | ICD-10-CM | POA: Diagnosis not present

## 2016-06-15 DIAGNOSIS — I351 Nonrheumatic aortic (valve) insufficiency: Secondary | ICD-10-CM

## 2016-06-15 DIAGNOSIS — I10 Essential (primary) hypertension: Secondary | ICD-10-CM

## 2016-06-15 DIAGNOSIS — I25810 Atherosclerosis of coronary artery bypass graft(s) without angina pectoris: Secondary | ICD-10-CM | POA: Diagnosis not present

## 2016-06-15 DIAGNOSIS — I480 Paroxysmal atrial fibrillation: Secondary | ICD-10-CM

## 2016-06-15 MED ORDER — METOPROLOL TARTRATE 25 MG PO TABS: ORAL_TABLET | ORAL | 2 refills | Status: DC

## 2016-06-15 NOTE — Patient Instructions (Signed)
Medication Instructions:  Your physician recommends that you continue on your current medications as directed. Please refer to the Current Medication list given to you today.   Labwork: none  Testing/Procedures: Your physician has requested that you have an echocardiogram. Echocardiography is a painless test that uses sound waves to create images of your heart. It provides your doctor with information about the size and shape of your heart and how well your heart's chambers and valves are working. This procedure takes approximately one hour. There are no restrictions for this procedure. To be done in July 2018    Follow-Up: Your physician recommends that you schedule a follow-up appointment in: 12 months. Please call our office in about 9 months to schedule this appointment.     Any Other Special Instructions Will Be Listed Below (If Applicable).     If you need a refill on your cardiac medications before your next appointment, please call your pharmacy.

## 2016-06-15 NOTE — Addendum Note (Signed)
Addended by: Thompson Grayer on: 06/15/2016 10:06 AM   Modules accepted: Orders

## 2016-06-22 DIAGNOSIS — L821 Other seborrheic keratosis: Secondary | ICD-10-CM | POA: Diagnosis not present

## 2016-06-22 DIAGNOSIS — L820 Inflamed seborrheic keratosis: Secondary | ICD-10-CM | POA: Diagnosis not present

## 2016-06-22 DIAGNOSIS — L570 Actinic keratosis: Secondary | ICD-10-CM | POA: Diagnosis not present

## 2016-06-22 NOTE — Addendum Note (Signed)
Addended by: Thompson Grayer on: 06/22/2016 09:09 AM   Modules accepted: Orders

## 2016-06-30 ENCOUNTER — Other Ambulatory Visit: Payer: Self-pay | Admitting: Cardiovascular Disease

## 2016-07-11 ENCOUNTER — Other Ambulatory Visit: Payer: Self-pay | Admitting: Family Medicine

## 2016-07-12 NOTE — Telephone Encounter (Signed)
Electronic refill request. Last Filled:  180 tablet 3 04/02/2015    Upcoming CPE 08/02/16.  Please advise.

## 2016-07-13 NOTE — Telephone Encounter (Signed)
Sent. Thanks.   

## 2016-07-26 ENCOUNTER — Ambulatory Visit (INDEPENDENT_AMBULATORY_CARE_PROVIDER_SITE_OTHER): Payer: PPO

## 2016-07-26 VITALS — BP 132/78 | HR 57 | Temp 97.5°F | Ht 67.5 in | Wt 167.5 lb

## 2016-07-26 DIAGNOSIS — E7849 Other hyperlipidemia: Secondary | ICD-10-CM

## 2016-07-26 DIAGNOSIS — R7309 Other abnormal glucose: Secondary | ICD-10-CM

## 2016-07-26 DIAGNOSIS — Z0000 Encounter for general adult medical examination without abnormal findings: Secondary | ICD-10-CM

## 2016-07-26 DIAGNOSIS — Z125 Encounter for screening for malignant neoplasm of prostate: Secondary | ICD-10-CM | POA: Diagnosis not present

## 2016-07-26 DIAGNOSIS — E784 Other hyperlipidemia: Secondary | ICD-10-CM

## 2016-07-26 DIAGNOSIS — Z130 Encounter for screening for diseases of the blood and blood-forming organs and certain disorders involving the immune mechanism: Secondary | ICD-10-CM | POA: Diagnosis not present

## 2016-07-26 DIAGNOSIS — Z1329 Encounter for screening for other suspected endocrine disorder: Secondary | ICD-10-CM | POA: Diagnosis not present

## 2016-07-26 DIAGNOSIS — E1149 Type 2 diabetes mellitus with other diabetic neurological complication: Secondary | ICD-10-CM

## 2016-07-26 DIAGNOSIS — R5383 Other fatigue: Secondary | ICD-10-CM | POA: Diagnosis not present

## 2016-07-26 LAB — PSA, MEDICARE: PSA: 0.22 ng/mL (ref 0.10–4.00)

## 2016-07-26 LAB — HEMOGLOBIN A1C: Hgb A1c MFr Bld: 6.6 % — ABNORMAL HIGH (ref 4.6–6.5)

## 2016-07-26 LAB — COMPREHENSIVE METABOLIC PANEL
ALT: 23 U/L (ref 0–53)
AST: 19 U/L (ref 0–37)
Albumin: 4.7 g/dL (ref 3.5–5.2)
Alkaline Phosphatase: 46 U/L (ref 39–117)
BILIRUBIN TOTAL: 0.7 mg/dL (ref 0.2–1.2)
BUN: 12 mg/dL (ref 6–23)
CALCIUM: 9.9 mg/dL (ref 8.4–10.5)
CHLORIDE: 106 meq/L (ref 96–112)
CO2: 30 meq/L (ref 19–32)
Creatinine, Ser: 0.98 mg/dL (ref 0.40–1.50)
GFR: 79.02 mL/min (ref >60.00)
Glucose, Bld: 147 mg/dL — ABNORMAL HIGH (ref 70–99)
POTASSIUM: 4.4 meq/L (ref 3.5–5.1)
Sodium: 142 mEq/L (ref 135–145)
Total Protein: 6.7 g/dL (ref 6.0–8.3)

## 2016-07-26 LAB — LIPID PANEL
Cholesterol: 134 mg/dL (ref 0–200)
HDL: 37.8 mg/dL — ABNORMAL LOW (ref >39.00)
LDL Cholesterol: 82 mg/dL (ref 0–99)
NonHDL: 96.63
Total CHOL/HDL Ratio: 4
Triglycerides: 75 mg/dL (ref 0.0–149.0)
VLDL: 15 mg/dL (ref 0.0–40.0)

## 2016-07-26 LAB — CBC WITH DIFFERENTIAL/PLATELET
BASOS PCT: 0.5 % (ref 0.0–3.0)
Basophils Absolute: 0 10*3/uL (ref 0.0–0.1)
EOS ABS: 0.2 10*3/uL (ref 0.0–0.7)
Eosinophils Relative: 2.4 % (ref 0.0–5.0)
HCT: 46.1 % (ref 39.0–52.0)
Hemoglobin: 15.4 g/dL (ref 13.0–17.0)
LYMPHS ABS: 2.2 10*3/uL (ref 0.7–4.0)
Lymphocytes Relative: 35 % (ref 12.0–46.0)
MCHC: 33.5 g/dL (ref 30.0–36.0)
MCV: 94.1 fl (ref 78.0–100.0)
MONO ABS: 0.4 10*3/uL (ref 0.1–1.0)
Monocytes Relative: 6.6 % (ref 3.0–12.0)
NEUTROS ABS: 3.4 10*3/uL (ref 1.4–7.7)
NEUTROS PCT: 55.5 % (ref 43.0–77.0)
PLATELETS: 203 10*3/uL (ref 150.0–400.0)
RBC: 4.9 Mil/uL (ref 4.22–5.81)
RDW: 13.6 % (ref 11.5–15.5)
WBC: 6.2 10*3/uL (ref 4.0–10.5)

## 2016-07-26 LAB — TSH: TSH: 1.18 u[IU]/mL (ref 0.35–4.50)

## 2016-07-26 LAB — VITAMIN B12: Vitamin B-12: 290 pg/mL (ref 211–911)

## 2016-07-26 NOTE — Progress Notes (Signed)
PCP notes:   Health maintenance:  Shingles - postponed Flu vaccine - pt reported vaccine taken in Oct 2017  Abnormal screenings:   Hearing - failed  Patient concerns:   None  Nurse concerns:  None  Next PCP appt:   08/02/2016 @ 0845  I reviewed health advisor's note, was available for consultation on the day of service listed in this note, and agree with documentation and plan. [name], MD.

## 2016-07-26 NOTE — Progress Notes (Signed)
Pre visit review using our clinic review tool, if applicable. No additional management support is needed unless otherwise documented below in the visit note. 

## 2016-07-26 NOTE — Progress Notes (Signed)
Subjective:   [name] [name] is a 75 y.o. male who presents for Medicare Annual/Subsequent preventive examination.  Review of Systems:  N/A Cardiac Risk Factors include: advanced age (>56men, >106 women);male gender;dyslipidemia;diabetes mellitus     Objective:    Vitals: BP 132/78 (BP Location: Left Arm, Patient Position: Sitting, Cuff Size: Normal)   Pulse (!) 57   Temp 97.5 F (36.4 C) (Oral)   Ht 5' 7.5" (1.715 m) Comment: no shoes  Wt 167 lb 8 oz (76 kg)   SpO2 98%   BMI 25.85 kg/m   Body mass index is 25.85 kg/m.  Tobacco History  Smoking Status  . Never Smoker  Smokeless Tobacco  . Never Used     Counseling given: No   Past Medical History:  Diagnosis Date  . Anginal pain ([place])   . Anxiety   . Chest pain   . 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 Diabetes mellitus   . Diabetic neuropathy ([place])   . Diverticulosis   . Diverticulosis   . Family history of anesthesia complication    ' they cant wake my brother very easy"  . Fatty liver   . Flank pain   . GERD (gastroesophageal reflux disease)   . Heart murmur   . History of bronchitis   . History of chicken pox   . 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 Hypercholesterolemia   . Hypertension   . Kidney stones   . Osteoarthritis    Past Surgical History:  Procedure Laterality Date  . CARDIAC CATHETERIZATION  08/21/2013   DR Angelena Form  . CHOLECYSTECTOMY  1983  . CORONARY ARTERY BYPASS GRAFT N/A 08/23/2013   Procedure: CORONARY ARTERY BYPASS GRAFTING (CABG);  Surgeon: Grace Isaac, MD;  Location: [place];  Service: Open Heart Surgery;  Laterality: N/A;  CABG times six utilizing the left internal mammary artery and the right greater saphenous vein harvested endoscopically  . ENDOVEIN HARVEST OF GREATER SAPHENOUS VEIN Right 08/23/2013   Procedure: ENDOVEIN HARVEST OF GREATER SAPHENOUS VEIN;  Surgeon: Grace Isaac, MD;  Location: [place];  Service: Open Heart Surgery;   Laterality: Right;  . HERNIA REPAIR     X3  . INTRAOPERATIVE TRANSESOPHAGEAL ECHOCARDIOGRAM N/A 08/23/2013   Procedure: INTRAOPERATIVE TRANSESOPHAGEAL ECHOCARDIOGRAM;  Surgeon: Grace Isaac, MD;  Location: [place];  Service: Open Heart Surgery;  Laterality: N/A;  . LEFT HEART CATHETERIZATION WITH CORONARY ANGIOGRAM N/A 08/21/2013   Procedure: LEFT HEART CATHETERIZATION WITH CORONARY ANGIOGRAM;  Surgeon: Burnell Blanks, MD;  Location[facility] CATH LAB;  Service: Cardiovascular;  Laterality: N/A;   Family History  Problem Relation Age of Onset  . Stroke Mother   . Diabetes Mother   . Heart disease Father     heart failure  . Colon cancer Maternal Grandmother   . Parkinsonism Maternal Grandfather   . Stroke Paternal Grandfather   . Prostate cancer Maternal Uncle   . CAD Brother     CABG   History  Sexual Activity  . Sexual activity: Not on file    Outpatient Encounter Prescriptions as of 07/26/2016  Medication Sig  . acyclovir (ZOVIRAX) 800 MG tablet TAKE 1 TABLET (800 MG TOTAL) BY MOUTH 2 (TWO) TIMES DAILY.  Marland Kitchen aspirin EC 81 MG tablet Take 1 tablet (81 mg total) by mouth daily.  . fenofibrate 160 MG tablet Take 1 tablet (160 mg total) by mouth at bedtime.  . gabapentin (NEURONTIN)  600 MG tablet Take 1 tablet (600 mg total) by mouth 3 (three) times daily.  Marland Kitchen losartan (COZAAR) 50 MG tablet TAKE 1 TABLET (50 MG TOTAL) BY MOUTH DAILY.  . metFORMIN (GLUCOPHAGE) 850 MG tablet Take 1 tablet (850 mg total) by mouth 2 (two) times daily with a meal.  . metoprolol tartrate (LOPRESSOR) 25 MG tablet TAKE 0.5 TABLETS (12.5 MG TOTAL) BY MOUTH 2 (TWO) TIMES DAILY.  . nitroGLYCERIN (NITROSTAT) 0.4 MG SL tablet Place 1 tablet (0.4 mg total) under the tongue every 5 (five) minutes as needed for chest pain (max 3 doses).   No facility-administered encounter medications on file as of 07/26/2016.     Activities of Daily Living In your present state of health, do you have any difficulty  performing the following activities: 07/26/2016  Hearing? Y  Vision? N  Difficulty concentrating or making decisions? N  Walking or climbing stairs? N  Dressing or bathing? N  Doing errands, shopping? N  Preparing Food and eating ? N  Using the Toilet? N  In the past six months, have you accidently leaked urine? N  Do you have problems with loss of bowel control? N  Managing your Medications? N  Managing your Finances? N  Housekeeping or managing your Housekeeping? N  Some recent data might be hidden    Patient Care Team: Tonia Ghent, MD as PCP - General (Family Medicine) Burnell Blanks, MD as Consulting Physician (Cardiology) Clarene Critchley, MD as Consulting Physician (Ophthalmology) Sydnee Levans, MD as Consulting Physician (Dermatology)   Assessment:     Hearing Screening   125Hz  250Hz  500Hz  1000Hz  2000Hz  3000Hz  4000Hz  6000Hz  8000Hz   Right ear:   0 0 40  0    Left ear:   40 0 40  0    Vision Screening Comments: Last vision exam in Oct 2017   Exercise Activities and Dietary recommendations Current Exercise Habits: Home exercise routine, Type of exercise: strength training/weights;stretching;treadmill;exercise ball;walking, Time (Minutes): > 60 (90 min ), Frequency (Times/Week): 5, Weekly Exercise (Minutes/Week): 0, Intensity: Moderate, Exercise limited by: None identified  Goals    . 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       Fall Risk Fall Risk  07/26/2016 07/24/2015 07/14/2014 07/11/2013  Falls in the past year? No No No No   Depression Screen PHQ 2/9 Scores 07/26/2016 07/24/2015 07/14/2014 07/11/2013  PHQ - 2 Score 0 0 0 0    Cognitive Function MMSE - Mini Mental State Exam 07/26/2016  Orientation to time 5  Orientation to Place 5  Registration 3  Attention/ Calculation 0  Recall 3  Language- name 2 objects 0  Language-  repeat 1  Language- follow 3 step command 3  Language- read & follow direction 0  Write a sentence 0  Copy design 0  Total score 20     PLEASE NOTE: A Mini-Cog screen was completed. Maximum score is 20. A value of 0 denotes this part of [place] MMSE was not completed or the patient failed this part of the Mini-Cog screening.   Mini-Cog Screening Orientation to Time - Max 5 pts Orientation to Place - Max 5 pts Registration - Max 3 pts Recall - Max 3 pts Language Repeat - Max 1 pts Language Follow 3 Step Command - Max 3 pts  Immunization History  Administered Date(s) Administered  . Influenza Split 06/11/2012, 06/08/2014  . Influenza,inj,Quad PF,36+ Mos 07/24/2015  . Influenza-Unspecified 05/08/2016  . Pneumococcal Conjugate-13 07/14/2014  . Pneumococcal Polysaccharide-23 06/11/2012  . Td 08/08/2008   Screening Tests Health Maintenance  Topic Date Due  . ZOSTAVAX  07/26/2017 (Originally 06/23/2000)  . HEMOGLOBIN A1C  07/27/2016  . FOOT EXAM  01/31/2017  . OPHTHALMOLOGY EXAM  05/23/2017  . COLONOSCOPY  07/11/2018  . TETANUS/TDAP  08/08/2018  . INFLUENZA VACCINE  Addressed  . PNA vac Low Risk Adult  Completed      Plan:     I have personally reviewed and addressed the Medicare Annual Wellness questionnaire and have noted the following in the patient's chart:  A. Medical and social history B. Use of alcohol, tobacco or illicit drugs  C. Current medications and supplements D. Functional ability and status E.  Nutritional status F.  Physical activity G. Advance directives H. List of other physicians I.  Hospitalizations, surgeries, and ER visits in previous 12 months J.  [place] to include hearing, vision, cognitive, depression L. Referrals and appointments - none  In addition, I have reviewed and discussed with patient certain preventive protocols, quality metrics, and best practice recommendations. A written personalized care plan for preventive  services as well as general preventive health recommendations were provided to patient.  See attached scanned questionnaire for additional information.   Signed,   Lindell Noe, MHA, BS, LPN Health Coach

## 2016-07-26 NOTE — Patient Instructions (Signed)
[name] [name] , Thank you for taking time to come for your Medicare Wellness Visit. I appreciate your ongoing commitment to your health goals. Please review the following plan we discussed and let me know if I can assist you in the future.   These are the goals we discussed: Goals    . 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        This is a list of the screening recommended for you and due dates:  Health Maintenance  Topic Date Due  . Shingles Vaccine  07/26/2017*  . Hemoglobin A1C  07/27/2016  . Complete foot exam   01/31/2017  . Eye exam for diabetics  05/23/2017  . Colon Cancer Screening  07/11/2018  . Tetanus Vaccine  08/08/2018  . Flu Shot  Addressed  . Pneumonia vaccines  Completed  *Topic was postponed. The date shown is not the original due date.   Preventive Care for Adults  A healthy lifestyle and preventive care can promote health and wellness. Preventive health guidelines for adults include the following key practices.  . A routine yearly physical is a good way to check with your health care provider about your health and preventive screening. It is a chance to share any concerns and updates on your health and to receive a thorough exam.  . Visit your dentist for a routine exam and preventive care every 6 months. Brush your teeth twice a day and floss once a day. Good oral hygiene prevents tooth decay and gum disease.  . The frequency of eye exams is based on your age, health, family medical history, use  of contact lenses, and other factors. Follow your health care provider's ecommendations for frequency of eye exams.  . Eat a healthy diet. Foods like vegetables, fruits, whole grains, low-fat dairy products, and lean protein foods contain the nutrients you need without too many calories. Decrease your intake of foods high in solid fats, added sugars, and  salt. Eat the right amount of calories for you. Get information about a proper diet from your health care provider, if necessary.  . Regular physical exercise is one of the most important things you can do for your health. Most adults should get at least 150 minutes of moderate-intensity exercise (any activity that increases your heart rate and causes you to sweat) each week. In addition, most adults need muscle-strengthening exercises on 2 or more days a week.  Silver Sneakers may be a benefit available to you. To determine eligibility, you may visit the website: www.silversneakers.com or contact program at 415-636-6722 Mon-Fri between 8AM-8PM.   . Maintain a healthy weight. The body mass index (BMI) is a screening tool to identify possible weight problems. It provides an estimate of body fat based on height and weight. Your health care provider can find your BMI and can help you achieve or maintain a healthy weight.   For adults 20 years and older: ? A BMI below 18.5 is considered underweight. ? A BMI of 18.5 to 24.9 is normal. ? A BMI of 25 to 29.9 is considered overweight. ? A BMI of 30 and above is considered obese.   . Maintain normal blood lipids and cholesterol levels by exercising and minimizing your intake of saturated fat. Eat a balanced diet with plenty of fruit and vegetables. Blood tests for lipids  and cholesterol should begin at age 64 and be repeated every 5 years. If your lipid or cholesterol levels are high, you are over 50, or you are at high risk for heart disease, you may need your cholesterol levels checked more frequently. Ongoing high lipid and cholesterol levels should be treated with medicines if diet and exercise are not working.  . If you smoke, find out from your health care provider how to quit. If you do not use tobacco, please do not start.  . If you choose to drink alcohol, please do not consume more than 2 drinks per day. One drink is considered to be 12 ounces  (355 mL) of beer, 5 ounces (148 mL) of wine, or 1.5 ounces (44 mL) of liquor.  . If you are 13-7 years old, ask your health care provider if you should take aspirin to prevent strokes.  . Use sunscreen. Apply sunscreen liberally and repeatedly throughout the day. You should seek shade when your shadow is shorter than you. Protect yourself by wearing long sleeves, pants, a wide-brimmed hat, and sunglasses year round, whenever you are outdoors.  . Once a month, do a whole body skin exam, using a mirror to look at the skin on your back. Tell your health care provider of new moles, moles that have irregular borders, moles that are larger than a pencil eraser, or moles that have changed in shape or color.

## 2016-07-29 ENCOUNTER — Other Ambulatory Visit: Payer: Self-pay | Admitting: Family Medicine

## 2016-08-02 ENCOUNTER — Ambulatory Visit (INDEPENDENT_AMBULATORY_CARE_PROVIDER_SITE_OTHER): Payer: PPO | Admitting: Family Medicine

## 2016-08-02 ENCOUNTER — Encounter: Payer: Self-pay | Admitting: Family Medicine

## 2016-08-02 VITALS — BP 142/70 | HR 54 | Temp 97.5°F | Ht 68.0 in | Wt 168.0 lb

## 2016-08-02 DIAGNOSIS — E7800 Pure hypercholesterolemia, unspecified: Secondary | ICD-10-CM | POA: Diagnosis not present

## 2016-08-02 DIAGNOSIS — I10 Essential (primary) hypertension: Secondary | ICD-10-CM

## 2016-08-02 DIAGNOSIS — Z125 Encounter for screening for malignant neoplasm of prostate: Secondary | ICD-10-CM | POA: Diagnosis not present

## 2016-08-02 DIAGNOSIS — E1149 Type 2 diabetes mellitus with other diabetic neurological complication: Secondary | ICD-10-CM

## 2016-08-02 DIAGNOSIS — Z7189 Other specified counseling: Secondary | ICD-10-CM

## 2016-08-02 MED ORDER — METFORMIN HCL 850 MG PO TABS: 850.0000 mg | ORAL_TABLET | Freq: Two times a day (BID) | ORAL | 3 refills | Status: DC

## 2016-08-02 MED ORDER — GABAPENTIN 600 MG PO TABS: 600.0000 mg | ORAL_TABLET | Freq: Three times a day (TID) | ORAL | 3 refills | Status: DC

## 2016-08-02 MED ORDER — LOSARTAN POTASSIUM 50 MG PO TABS: 50.0000 mg | ORAL_TABLET | Freq: Every day | ORAL | 3 refills | Status: DC

## 2016-08-02 MED ORDER — METOPROLOL TARTRATE 25 MG PO TABS: ORAL_TABLET | ORAL | 3 refills | Status: DC

## 2016-08-02 MED ORDER — VITAMIN B-12 1000 MCG PO TABS: 2000.0000 ug | ORAL_TABLET | Freq: Every day | ORAL | Status: DC

## 2016-08-02 MED ORDER — ACYCLOVIR 800 MG PO TABS: 800.0000 mg | ORAL_TABLET | Freq: Two times a day (BID) | ORAL | 3 refills | Status: DC

## 2016-08-02 MED ORDER — DICLOFENAC SODIUM 1 % TD GEL: 4.0000 g | Freq: Four times a day (QID) | TRANSDERMAL | 99 refills | Status: DC | PRN

## 2016-08-02 MED ORDER — FENOFIBRATE 160 MG PO TABS: 160.0000 mg | ORAL_TABLET | Freq: Every day | ORAL | 3 refills | Status: DC

## 2016-08-02 NOTE — Patient Instructions (Addendum)
Okay to increase the B12 to 2011mcg a day.  Take care.  Glad to see you.  Recheck A1c in about 6 months before a visit.  Update me as needed.

## 2016-08-02 NOTE — Progress Notes (Signed)
Pre visit review using our clinic review tool, if applicable. No additional management support is needed unless otherwise documented below in the visit note. 

## 2016-08-02 NOTE — Progress Notes (Signed)
Hearing screening failed.  D/w pt.  Declined hearing aids.  Advance directive- d/w pt- wife or daughter equally designated if patient were incapacitated.   PSA wnl.  D/w pt.    Hypertension:    Using medication without problems or lightheadedness: yes Chest pain with exertion:no Edema:no Short of breath:no  Diabetes:  Using medications without difficulties: yes Hypoglycemic episodes:no Hyperglycemic episodes:no Feet problems: still with plantar fasciitis and neuropathy.  He can tolerate his situation as is.   Blood Sugars averaging: not checked often at home.   eye exam within last year:yes A1c controlled.  D/w pt.    Elevated Cholesterol: Using medications without problems:yes Muscle aches: not likely from meds, d/w pt.  Likely from OA.   Diet compliance:yes Exercise:yes Labs d/w pt.    R shoulder OA pain. Improved with voltaren gel once daily.  No ADE on med.  Compliant.    ED.  Longstanding per patient.  Failed viagra prev.  Didn't want treatment at this point.    Meds, vitals, and allergies reviewed.   PMH and SH reviewed  ROS: Per HPI unless specifically indicated in ROS section   GEN: nad, alert and oriented HEENT: mucous membranes moist NECK: supple w/o LA CV: rrr. PULM: ctab, no inc wob ABD: soft, +bs EXT: no edema SKIN: no acute rash  Diabetic foot exam: Normal inspection No skin breakdown No calluses  Normal DP pulses Normal sensation to light touch and monofilament Nails thickened.

## 2016-08-04 ENCOUNTER — Encounter: Payer: Self-pay | Admitting: Family Medicine

## 2016-08-04 ENCOUNTER — Ambulatory Visit (INDEPENDENT_AMBULATORY_CARE_PROVIDER_SITE_OTHER): Payer: PPO | Admitting: Family Medicine

## 2016-08-04 DIAGNOSIS — Z01810 Encounter for preprocedural cardiovascular examination: Secondary | ICD-10-CM | POA: Insufficient documentation

## 2016-08-04 DIAGNOSIS — Z125 Encounter for screening for malignant neoplasm of prostate: Secondary | ICD-10-CM | POA: Insufficient documentation

## 2016-08-04 DIAGNOSIS — M79602 Pain in left arm: Secondary | ICD-10-CM | POA: Diagnosis not present

## 2016-08-04 DIAGNOSIS — I10 Essential (primary) hypertension: Secondary | ICD-10-CM | POA: Insufficient documentation

## 2016-08-04 NOTE — Assessment & Plan Note (Signed)
Reasonably controlled, continue work on diet and exercise, no change in meds, labs d/w pt.  He agrees.

## 2016-08-04 NOTE — Assessment & Plan Note (Signed)
Controlled, continue work on diet and exercise, no change in meds other than inc in B12- see AVS- just to see if that helps with neuropathy sx at all.  Labs d/w pt.  He agrees.  >25 minutes spent in face to face time with patient, >50% spent in counselling or coordination of care.

## 2016-08-04 NOTE — Assessment & Plan Note (Signed)
Controlled, continue work on diet and exercise, no change in meds, labs d/w pt.  He agrees.

## 2016-08-04 NOTE — Assessment & Plan Note (Signed)
PSA wnl.  D/w pt.   

## 2016-08-04 NOTE — Progress Notes (Signed)
Pre visit review using our clinic review tool, if applicable. No additional management support is needed unless otherwise documented below in the visit note. 

## 2016-08-04 NOTE — Assessment & Plan Note (Signed)
Advance directive- d/w pt- wife or daughter equally designated if patient were incapacitated.  

## 2016-08-04 NOTE — Patient Instructions (Addendum)
Likely a peripheral nerve that was irritated with the blood draw.  I would get blood drawn on the RIGHT arm next time.  If persistent symptoms, then let me know.  Take care.  Glad to see you.

## 2016-08-04 NOTE — Progress Notes (Signed)
Prev he had blood drawn at L antecubital space and had some wrist area pain at the time and thereafter.  It was better at the last OV but the pain got worse in the meantime.  It didn't hurt this AM until BP check on the L arm.  Sx resolved by the time I got into the exam room today.  No R sided sx.  No rash.    He didn't used to have troubles with blood draws.    Meds, vitals, and allergies reviewed.   ROS: Per HPI unless specifically indicated in ROS section   nad rrr ctab L arm with normal inspection, proximal and distal arm not tender. Elbow and wrist not tender. No rash. No bruising. Distally neurovascularly intact.

## 2016-08-05 DIAGNOSIS — M79603 Pain in arm, unspecified: Secondary | ICD-10-CM | POA: Insufficient documentation

## 2016-08-05 NOTE — Assessment & Plan Note (Signed)
I think he likely had peripheral nerve irritation related to blood draw and I've heard of this occasionally happening, though rarely. This is getting better in the meantime. He only has intermittent symptoms without weakness. Should fully resolve. Update me as needed. Reasonable to get blood drawn on the right arm from now on. He agrees.

## 2016-08-30 DIAGNOSIS — H40011 Open angle with borderline findings, low risk, right eye: Secondary | ICD-10-CM | POA: Diagnosis not present

## 2016-08-30 DIAGNOSIS — H1789 Other corneal scars and opacities: Secondary | ICD-10-CM | POA: Diagnosis not present

## 2016-08-30 DIAGNOSIS — H2512 Age-related nuclear cataract, left eye: Secondary | ICD-10-CM | POA: Diagnosis not present

## 2016-09-01 ENCOUNTER — Telehealth: Payer: Self-pay | Admitting: Family Medicine

## 2016-09-01 ENCOUNTER — Telehealth: Payer: Self-pay

## 2016-09-01 ENCOUNTER — Telehealth: Payer: Self-pay | Admitting: Cardiovascular Disease

## 2016-09-01 NOTE — Telephone Encounter (Signed)
[name] [name] with [employer] called and pt was advised by [facility] to go to ED for new symptoms of irregular heart beat and dizziness. Pt was started on Timolol maleate by optometrist. I called and spoke with [name] [name] and pt was on cell phone with Dr Reatha Armour office (the cardiology office) for direction of what to do. [name] Waggy will cb if needed but wanted Dr Damita Dunnings to be aware of what was going on.

## 2016-09-01 NOTE — Telephone Encounter (Signed)
See phone note 09/01/16.

## 2016-09-01 NOTE — Telephone Encounter (Signed)
Reviewed with Dr.Varanasi (office DOD) and timolol should not be causing palpitations.  OK to continue this.  OK for pt to take extra 12.5 lopressor now if heart rate above 65.  Will need EKG tomorrow.  Should go to Urgent Care/ED if problems tonight.  I spoke with pt and gave him this information. He states he took extra 12.5 mg lopressor around noon today.  He reports at this time palpitations have stopped and everything is calm.  I offered him appt with Dr. Angelena Form tomorrow but he does not feel he needs to come in since he is feeling better now.  He will call us tomorrow morning if palpitations return.

## 2016-09-01 NOTE — Telephone Encounter (Signed)
[facility] Patient Name: [name] [name] DOB: 07/16/1941 Initial Comment Caller was given eye drops for glaucoma at office and now he's having irregular heartbeat really bad. Nurse Assessment Nurse: Jimmey Ralph, RN, Lissa Date/Time (Eastern Time): 09/01/2016 3:58:06 PM Confirm and document reason for call. If symptomatic, describe symptoms. ---Caller was given eye drops for glaucoma at office and now he's having irregular heartbeat really bad. Timololmaleate 5% and I have taken one dose last night and one this morning. Does the patient have any new or worsening symptoms? ---Yes Will a triage be completed? ---Yes Related visit to physician within the last 2 weeks? ---Yes Does the PT have any chronic conditions? (i.e. diabetes, asthma, etc.) ---Yes List chronic conditions. ---heart Glaucoma cholesterol HTN I am on 11 medications. Is this a behavioral health or substance abuse call? ---NoGuidelines Guideline Title Affirmed Question Affirmed Notes Heart Rate and Heartbeat Questions Dizziness, lightheadedness, or weakness Final Disposition User Go to ED Now Hammonds, RN, Lissa Comments I had surgery three years ago and I know this is my heart. Referrals [facility] - ED Disagree/Comply: Comply

## 2016-09-01 NOTE — Telephone Encounter (Signed)
I spoke with pt. He reports he took first dose of timolol eye gtts last night. Took second dose this morning.  Around 11:00 this AM he developed palpitations. Describes as feeling heart go up into his throat. He feels flushed in his head and neck when having palpitations.  Can feel skipped beats and heart rate speeds up and slows down.  Has been occurring off and on since 11:00 today.  No chest pain, no shortness of breath, no dizziness.  Will review with provider in office.

## 2016-09-01 NOTE — Telephone Encounter (Signed)
New message   Timolol maleate was given to patient for glaucoma

## 2016-09-02 NOTE — Telephone Encounter (Signed)
AGree. If he has recurrent symptoms, will need to call for EKG and monitor. cdm

## 2016-09-27 DIAGNOSIS — H1789 Other corneal scars and opacities: Secondary | ICD-10-CM | POA: Diagnosis not present

## 2016-09-27 DIAGNOSIS — H40011 Open angle with borderline findings, low risk, right eye: Secondary | ICD-10-CM | POA: Diagnosis not present

## 2016-10-27 ENCOUNTER — Other Ambulatory Visit: Payer: Self-pay | Admitting: Family Medicine

## 2016-12-30 DIAGNOSIS — H40021 Open angle with borderline findings, high risk, right eye: Secondary | ICD-10-CM | POA: Diagnosis not present

## 2017-01-02 DIAGNOSIS — S0592XA Unspecified injury of left eye and orbit, initial encounter: Secondary | ICD-10-CM | POA: Diagnosis not present

## 2017-01-02 DIAGNOSIS — E1140 Type 2 diabetes mellitus with diabetic neuropathy, unspecified: Secondary | ICD-10-CM | POA: Diagnosis not present

## 2017-01-02 DIAGNOSIS — Z7982 Long term (current) use of aspirin: Secondary | ICD-10-CM | POA: Diagnosis not present

## 2017-01-02 DIAGNOSIS — Y9389 Activity, other specified: Secondary | ICD-10-CM | POA: Insufficient documentation

## 2017-01-02 DIAGNOSIS — E1149 Type 2 diabetes mellitus with other diabetic neurological complication: Secondary | ICD-10-CM | POA: Insufficient documentation

## 2017-01-02 DIAGNOSIS — W228XXA Striking against or struck by other objects, initial encounter: Secondary | ICD-10-CM | POA: Diagnosis not present

## 2017-01-02 DIAGNOSIS — Y9289 Other specified places as the place of occurrence of the external cause: Secondary | ICD-10-CM | POA: Insufficient documentation

## 2017-01-02 DIAGNOSIS — Y999 Unspecified external cause status: Secondary | ICD-10-CM | POA: Insufficient documentation

## 2017-01-02 DIAGNOSIS — S0502XA Injury of conjunctiva and corneal abrasion without foreign body, left eye, initial encounter: Secondary | ICD-10-CM | POA: Insufficient documentation

## 2017-01-02 DIAGNOSIS — I2510 Atherosclerotic heart disease of native coronary artery without angina pectoris: Secondary | ICD-10-CM | POA: Diagnosis not present

## 2017-01-02 DIAGNOSIS — Z7984 Long term (current) use of oral hypoglycemic drugs: Secondary | ICD-10-CM | POA: Diagnosis not present

## 2017-01-02 DIAGNOSIS — Z951 Presence of aortocoronary bypass graft: Secondary | ICD-10-CM | POA: Diagnosis not present

## 2017-01-03 ENCOUNTER — Encounter (HOSPITAL_COMMUNITY): Payer: Self-pay | Admitting: Emergency Medicine

## 2017-01-03 ENCOUNTER — Emergency Department (HOSPITAL_COMMUNITY)
Admission: EM | Admit: 2017-01-03 | Discharge: 2017-01-03 | Disposition: A | Discharge status: Home / Self Care | Payer: PPO | Attending: Emergency Medicine | Admitting: Emergency Medicine

## 2017-01-03 DIAGNOSIS — S0502XA Injury of conjunctiva and corneal abrasion without foreign body, left eye, initial encounter: Secondary | ICD-10-CM

## 2017-01-03 MED ORDER — FLUORESCEIN SODIUM 0.6 MG OP STRP
1.0000 | ORAL_STRIP | Freq: Once | OPHTHALMIC | Status: AC
  Administered 2017-01-03: 1 via OPHTHALMIC
  Filled 2017-01-03: qty 1

## 2017-01-03 MED ORDER — ERYTHROMYCIN 5 MG/GM OP OINT: 1.0000 "application " | TOPICAL_OINTMENT | Freq: Four times a day (QID) | OPHTHALMIC | 1 refills | Status: DC

## 2017-01-03 MED ORDER — ACETAMINOPHEN 500 MG PO TABS: 500.0000 mg | ORAL_TABLET | Freq: Four times a day (QID) | ORAL | 0 refills | Status: DC | PRN

## 2017-01-03 MED ORDER — PROPARACAINE HCL 0.5 % OP SOLN
1.0000 [drp] | Freq: Once | OPHTHALMIC | Status: AC
  Administered 2017-01-03: 1 [drp] via OPHTHALMIC
  Filled 2017-01-03: qty 15

## 2017-01-03 NOTE — ED Triage Notes (Signed)
Reports working outside today when brush hit him in the left eye.  C/o pain at 5/10 all the time that goes up to a 9 when he lays down.  Denies any change in vision.

## 2017-01-03 NOTE — ED Provider Notes (Signed)
[facility] DEPT Provider Note   CSN: [id number] Arrival date & time: 01/02/17  2354     History   Chief Complaint Chief Complaint  Patient presents with  . Eye Injury    HPI [name] [name] is a 76 y.o. male.  [name] is a 76 y.o. Male who presents to the ED complaining of left eye pain after an injury earlier today. Patient reports he is working outside earlier today when a piece of brush hit his eye. He reports since then he's had some mild pain that seems to be worse with lying down. He reports his pain is more to the lateral part of his eyeball. It is worse with blinking his eye. No treatments attempted prior to arrival. He does wear glasses. He does not wear contacts. He denies any changes to his vision. He denies fevers, double vision, blurry vision or other injury.   The history is provided by the patient and medical records. No language interpreter was used.    Past Medical History:  Diagnosis Date  . Anginal pain ([place])   . Anxiety   . Chest pain   . 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 Diabetes mellitus   . Diabetic neuropathy ([place])   . Diverticulosis   . Diverticulosis   . Erectile dysfunction   . Family history of anesthesia complication    ' they cant wake my brother very easy"  . Fatty liver   . Flank pain   . GERD (gastroesophageal reflux disease)   . Heart murmur   . History of bronchitis   . History of chicken pox   . 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 Hypercholesterolemia   . Hypertension   . Kidney stones   . Osteoarthritis     Patient Active Problem List   Diagnosis Date Noted  . Arm pain 08/05/2016  . Prostate cancer screening 08/04/2016  . HTN (hypertension) 08/04/2016  . Rectal pain 02/18/2016  . Plantar fasciitis 02/18/2016  . AK (actinic keratosis) 02/02/2016  . Glaucoma suspect 02/02/2016  . Anxiety state 02/19/2015  . Exertional chest pain 02/19/2015  . Advance care planning  07/15/2014  . Atrial fibrillation ([place]) 08/28/2013  . 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 Unstable angina ([place]) 08/21/2013  . 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 Abdominal pain, other specified site 04/24/2013  . Pelvic pain 04/24/2013  . Osteoporosis 06/16/2012  . Medicare annual wellness visit, subsequent 06/12/2012  . Cough 05/29/2012  . 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 Neck pain 12/12/2011  . Foreign body 12/12/2011  . Chest pain 11/29/2010  . Hypercholesteremia 11/29/2010  . Murmur 11/29/2010    Past Surgical History:  Procedure Laterality Date  . CARDIAC CATHETERIZATION  08/21/2013   DR Angelena Form  . CHOLECYSTECTOMY  1983  . CORONARY ARTERY BYPASS GRAFT N/A 08/23/2013   Procedure: CORONARY ARTERY BYPASS GRAFTING (CABG);  Surgeon: Grace Isaac, MD;  Location: [facility];  Service: Open Heart Surgery;  Laterality: N/A;  CABG times six utilizing the left internal mammary artery and the right greater saphenous vein harvested endoscopically  . ENDOVEIN HARVEST OF GREATER SAPHENOUS VEIN Right 08/23/2013   Procedure: ENDOVEIN HARVEST OF GREATER SAPHENOUS VEIN;  Surgeon: Grace Isaac, MD;  Location: [facility];  Service: Open Heart Surgery;  Laterality: Right;  . HERNIA REPAIR     X3  . INTRAOPERATIVE TRANSESOPHAGEAL ECHOCARDIOGRAM N/A 08/23/2013  Procedure: INTRAOPERATIVE TRANSESOPHAGEAL ECHOCARDIOGRAM;  Surgeon: Grace Isaac, MD;  Location: [facility];  Service: Open Heart Surgery;  Laterality: N/A;  . LEFT HEART CATHETERIZATION WITH CORONARY ANGIOGRAM N/A 08/21/2013   Procedure: LEFT HEART CATHETERIZATION WITH CORONARY ANGIOGRAM;  Surgeon: Burnell Blanks, MD;  Location: [facility] CATH LAB;  Service: Cardiovascular;  Laterality: N/A;       Home Medications    Prior to Admission medications   Medication Sig Start Date End Date Taking? Authorizing Provider  acetaminophen (TYLENOL) 500 MG tablet Take 1 tablet (500 mg total) by mouth every 6 (six) hours as needed  for mild pain or moderate pain. 01/03/17   Waynetta Pean, PA-C  acyclovir (ZOVIRAX) 800 MG tablet Take 1 tablet (800 mg total) by mouth 2 (two) times daily. 08/02/16   Tonia Ghent, MD  acyclovir (ZOVIRAX) 800 MG tablet TAKE 1 TABLET (800 MG TOTAL) BY MOUTH 2 (TWO) TIMES DAILY. 10/28/16   Tonia Ghent, MD  aspirin EC 81 MG tablet Take 1 tablet (81 mg total) by mouth daily. 08/30/13   Barrett, Erin R, PA-C  diclofenac sodium (VOLTAREN) 1 % GEL Apply 4 g topically 4 (four) times daily as needed. 08/02/16   Tonia Ghent, MD  erythromycin ophthalmic ointment Place 1 application into the left eye every 6 (six) hours. Place 1/2 inch ribbon of ointment in the affected eye 4 times a day 01/03/17   Waynetta Pean, PA-C  fenofibrate 160 MG tablet Take 1 tablet (160 mg total) by mouth at bedtime. 08/02/16   Tonia Ghent, MD  gabapentin (NEURONTIN) 600 MG tablet Take 1 tablet (600 mg total) by mouth 3 (three) times daily. 08/02/16   Tonia Ghent, MD  latanoprost (XALATAN) 0.005 % ophthalmic solution Place 1 drop into the right eye at bedtime.    [provider]  losartan (COZAAR) 50 MG tablet Take 1 tablet (50 mg total) by mouth daily. 08/02/16   Tonia Ghent, MD  metFORMIN (GLUCOPHAGE) 850 MG tablet Take 1 tablet (850 mg total) by mouth 2 (two) times daily with a meal. 08/02/16   Tonia Ghent, MD  metoprolol tartrate (LOPRESSOR) 25 MG tablet TAKE 0.5 TABLETS (12.5 MG TOTAL) BY MOUTH 2 (TWO) TIMES DAILY. 08/02/16   Tonia Ghent, MD  nitroGLYCERIN (NITROSTAT) 0.4 MG SL tablet Place 1 tablet (0.4 mg total) under the tongue every 5 (five) minutes as needed for chest pain (max 3 doses). 03/02/15   Burnell Blanks, MD  vitamin B-12 (CYANOCOBALAMIN) 1000 MCG tablet Take 2 tablets (2,000 mcg total) by mouth daily. 08/02/16   Tonia Ghent, MD    Family History Family History  Problem Relation Age of Onset  . Stroke Mother   . Diabetes Mother   . Heart disease  Father        heart failure  . Colon cancer Maternal Grandmother   . Parkinsonism Maternal Grandfather   . Stroke Paternal Grandfather   . Prostate cancer Maternal Uncle   . CAD Brother        CABG    Social History Social History  Substance Use Topics  . Smoking status: Never Smoker  . Smokeless tobacco: Never Used  . Alcohol use No     Allergies   Aspirin and Statins   Review of Systems Review of Systems  Constitutional: Negative for fever.  Eyes: Positive for pain. Negative for photophobia, discharge, redness and visual disturbance.  Skin: Negative for rash and wound.  Neurological:  Negative for syncope, light-headedness and headaches.     Physical Exam Updated Vital Signs BP (!) 180/77 (BP Location: Left Arm)   Pulse (!) 46   Temp 97.7 F (36.5 C) (Oral)   Resp 20   Ht 5\' 7"  (1.702 m)   Wt 77.1 kg (170 lb)   SpO2 99%   BMI 26.63 kg/m   Physical Exam  Constitutional: He is oriented to person, place, and time. He appears well-developed and well-nourished. No distress.  HENT:  Head: Normocephalic and atraumatic.  Right Ear: External ear normal.  Left Ear: External ear normal.  Mouth/Throat: Oropharynx is clear and moist.  Eyes: EOM are normal. Pupils are equal, round, and reactive to light. Right eye exhibits no discharge. Left eye exhibits no discharge.  EOMs are intact. Patient has a mild left conjunctival injection. No evidence of foreign bodies on exam. Bilateral eyes are anesthetized with proparacaine and left eye was stained with fluorescein. There is a small corneal abrasion noted to the 3:00 position of his eyeball. No Seidel sign. No evidence of globe rupture. No foreign bodies noted.  Intraocular Pressures were checked with Tono-Pen bilaterally. Bilateral eye pressures are 15 mmHg.  Neck: Neck supple.  Pulmonary/Chest: Effort normal. No respiratory distress.  Neurological: He is alert and oriented to person, place, and time. No cranial nerve  deficit. Coordination normal.  Skin: Skin is warm and dry. Capillary refill takes less than 2 seconds. No rash noted. He is not diaphoretic. No erythema. No pallor.  Psychiatric: He has a normal mood and affect. His behavior is normal.  Nursing note and vitals reviewed.     Visual Acuity  Right Eye Distance: 20/32 Left Eye Distance: 20/50 Bilateral Distance: 20/32  Right Eye Near:   Left Eye Near:    Bilateral Near:      ED Treatments / Results  Labs (all labs ordered are listed, but only abnormal results are displayed) Labs Reviewed - No data to display  EKG  EKG Interpretation None       Radiology No results found.  Procedures Procedures (including critical care time)  Medications Ordered in ED Medications  fluorescein ophthalmic strip 1 strip (1 strip Both Eyes Given 01/03/17 0411)  proparacaine (ALCAINE) 0.5 % ophthalmic solution 1 drop (1 drop Both Eyes Given 01/03/17 0411)     Initial Impression / Assessment and Plan / ED Course  I have reviewed the triage vital signs and the nursing notes.  Pertinent labs & imaging results that were available during my care of the patient were reviewed by me and considered in my medical decision making (see chart for details).    This  is a 76 y.o. Male who presents to the ED complaining of left eye pain after an injury earlier today. Patient reports he is working outside earlier today when a piece of brush hit his eye. He reports since then he's had some mild pain that seems to be worse with lying down. He reports his pain is more to the lateral part of his eyeball. It is worse with blinking his eye. He does have history of glaucoma to his right eye and is followed by an ophthalmologist. On exam patient is afebrile nontoxic appearing. On fluorescein staining is evidence of a small corneal abrasion to the lateral aspect of his eyeball around the 3:00 position. No Seidel sign. No evidence of globe rupture. No evidence of foreign  body. Intraocular pressures were obtained with Tono-Pen. Bilateral pressures were 15 mmHg. This is  reassuring. Patient tells me his pain resolved after I anesthetized his left eye with proparacaine. Patient corneal abrasion. We'll discharge with erythromycin ointment. Tylenol as needed for pain. I did encourage him to follow-up with his ophthalmologist. I discussed strict and specific return precautions. I advised the patient to follow-up with their primary care provider this week. I advised the patient to return to the emergency department with new or worsening symptoms or new concerns. The patient verbalized understanding and agreement with plan.    This patient was discussed with and evaluated by Dr. Dina Midkiff who agrees with assessment and plan.   Final Clinical Impressions(s) / ED Diagnoses   Final diagnoses:  Abrasion of left cornea, initial encounter    New Prescriptions New Prescriptions   ACETAMINOPHEN (TYLENOL) 500 MG TABLET    Take 1 tablet (500 mg total) by mouth every 6 (six) hours as needed for mild pain or moderate pain.   ERYTHROMYCIN OPHTHALMIC OINTMENT    Place 1 application into the left eye every 6 (six) hours. Place 1/2 inch ribbon of ointment in the affected eye 4 times a day     Waynetta Pean, PA-C 01/03/17 0440    Merryl Hacker, MD 01/03/17 [id number]

## 2017-01-03 NOTE — ED Notes (Addendum)
Pt departed in NAD, refused use of wheelchair. [place] returned to [employer].

## 2017-01-06 DIAGNOSIS — L820 Inflamed seborrheic keratosis: Secondary | ICD-10-CM | POA: Diagnosis not present

## 2017-01-06 DIAGNOSIS — L570 Actinic keratosis: Secondary | ICD-10-CM | POA: Diagnosis not present

## 2017-01-06 DIAGNOSIS — D1801 Hemangioma of skin and subcutaneous tissue: Secondary | ICD-10-CM | POA: Diagnosis not present

## 2017-01-06 DIAGNOSIS — L821 Other seborrheic keratosis: Secondary | ICD-10-CM | POA: Diagnosis not present

## 2017-01-06 DIAGNOSIS — L814 Other melanin hyperpigmentation: Secondary | ICD-10-CM | POA: Diagnosis not present

## 2017-01-13 DIAGNOSIS — S0502XA Injury of conjunctiva and corneal abrasion without foreign body, left eye, initial encounter: Secondary | ICD-10-CM | POA: Diagnosis not present

## 2017-01-18 ENCOUNTER — Encounter: Payer: Self-pay | Admitting: Family Medicine

## 2017-01-18 ENCOUNTER — Ambulatory Visit (INDEPENDENT_AMBULATORY_CARE_PROVIDER_SITE_OTHER): Payer: PPO | Admitting: Family Medicine

## 2017-01-18 DIAGNOSIS — S30860A Insect bite (nonvenomous) of lower back and pelvis, initial encounter: Secondary | ICD-10-CM | POA: Diagnosis not present

## 2017-01-18 DIAGNOSIS — W57XXXA Bitten or stung by nonvenomous insect and other nonvenomous arthropods, initial encounter: Secondary | ICD-10-CM

## 2017-01-18 DIAGNOSIS — R531 Weakness: Secondary | ICD-10-CM | POA: Diagnosis not present

## 2017-01-18 LAB — CBC WITH DIFFERENTIAL/PLATELET
BASOS ABS: 0 10*3/uL (ref 0.0–0.1)
Basophils Relative: 0.5 % (ref 0.0–3.0)
EOS ABS: 0.2 10*3/uL (ref 0.0–0.7)
Eosinophils Relative: 2.9 % (ref 0.0–5.0)
HCT: 44.9 % (ref 39.0–52.0)
Hemoglobin: 15.1 g/dL (ref 13.0–17.0)
LYMPHS ABS: 2.3 10*3/uL (ref 0.7–4.0)
Lymphocytes Relative: 37.2 % (ref 12.0–46.0)
MCHC: 33.7 g/dL (ref 30.0–36.0)
MCV: 94.1 fl (ref 78.0–100.0)
Monocytes Absolute: 0.4 10*3/uL (ref 0.1–1.0)
Monocytes Relative: 7.1 % (ref 3.0–12.0)
NEUTROS ABS: 3.3 10*3/uL (ref 1.4–7.7)
NEUTROS PCT: 52.3 % (ref 43.0–77.0)
PLATELETS: 185 10*3/uL (ref 150.0–400.0)
RBC: 4.77 Mil/uL (ref 4.22–5.81)
RDW: 13.2 % (ref 11.5–15.5)
WBC: 6.2 10*3/uL (ref 4.0–10.5)

## 2017-01-18 LAB — COMPREHENSIVE METABOLIC PANEL
ALK PHOS: 51 U/L (ref 39–117)
ALT: 17 U/L (ref 0–53)
AST: 18 U/L (ref 0–37)
Albumin: 4.7 g/dL (ref 3.5–5.2)
BILIRUBIN TOTAL: 0.6 mg/dL (ref 0.2–1.2)
BUN: 20 mg/dL (ref 6–23)
CALCIUM: 10.5 mg/dL (ref 8.4–10.5)
CO2: 31 meq/L (ref 19–32)
CREATININE: 1.12 mg/dL (ref 0.40–1.50)
Chloride: 105 mEq/L (ref 96–112)
GFR: 67.65 mL/min (ref >60.00)
GLUCOSE: 141 mg/dL — AB (ref 70–99)
Potassium: 4.7 mEq/L (ref 3.5–5.1)
Sodium: 141 mEq/L (ref 135–145)
TOTAL PROTEIN: 7.1 g/dL (ref 6.0–8.3)

## 2017-01-18 LAB — VITAMIN D 25 HYDROXY (VIT D DEFICIENCY, FRACTURES): VITD: 35.67 ng/mL (ref 30.00–100.00)

## 2017-01-18 LAB — TSH: TSH: 2.18 u[IU]/mL (ref 0.35–4.50)

## 2017-01-18 LAB — VITAMIN B12: VITAMIN B 12: 717 pg/mL (ref 211–911)

## 2017-01-18 NOTE — Assessment & Plan Note (Addendum)
New- progressive per pt, since tick bite. No rashes or abnormal findings on exam which is reassuring that this is not a tick borne illness. Reassurance provided bu I do think lab work today is warranted. Orders Placed This Encounter  Procedures  . [place] spotted fvr abs pnl(IgG+IgM)  . B. burgdorfi antibodies  . Comprehensive metabolic panel  . CBC with Differential/Platelet  . TSH  . Vitamin B12  . Vitamin D, 25-hydroxy   The patient indicates understanding of these issues and agrees with the plan.

## 2017-01-18 NOTE — Progress Notes (Signed)
Subjective:   Patient ID: [name] [name], male    DOB: [birth year], 76 y.o.   MRN: [id number]  [name] [name] is a pleasant 76 y.o. year old male patient of Dr. Damita Dunnings, new to me, who presents to clinic today with Extremity Weakness and Insect Bite  on 01/18/2017  HPI:  Wife removed a tick from his right buttocks 3 weeks ago.  No rashes, headaches, fever or photophobia he has noticed lower extremity weakness that is progressing since this tick bite.  Fell yesterday at the gym- felt he felt just because his legs were weak and "gave out" on him.  No dizziness, CP or SOB prior to the fall. He does have a h/o CAD.  Current Outpatient Prescriptions on File Prior to Visit  Medication Sig Dispense Refill  . acetaminophen (TYLENOL) 500 MG tablet Take 1 tablet (500 mg total) by mouth every 6 (six) hours as needed for mild pain or moderate pain. 30 tablet 0  . acyclovir (ZOVIRAX) 800 MG tablet Take 1 tablet (800 mg total) by mouth 2 (two) times daily. 180 tablet 3  . acyclovir (ZOVIRAX) 800 MG tablet TAKE 1 TABLET (800 MG TOTAL) BY MOUTH 2 (TWO) TIMES DAILY. 180 tablet 1  . aspirin EC 81 MG tablet Take 1 tablet (81 mg total) by mouth daily.    . diclofenac sodium (VOLTAREN) 1 % GEL Apply 4 g topically 4 (four) times daily as needed. 300 g prn  . erythromycin ophthalmic ointment Place 1 application into the left eye every 6 (six) hours. Place 1/2 inch ribbon of ointment in the affected eye 4 times a day 1 g 1  . fenofibrate 160 MG tablet Take 1 tablet (160 mg total) by mouth at bedtime. 90 tablet 3  . gabapentin (NEURONTIN) 600 MG tablet Take 1 tablet (600 mg total) by mouth 3 (three) times daily. 270 tablet 3  . latanoprost (XALATAN) 0.005 % ophthalmic solution Place 1 drop into the right eye at bedtime.    Marland Kitchen losartan (COZAAR) 50 MG tablet Take 1 tablet (50 mg total) by mouth daily. 90 tablet 3  . metFORMIN (GLUCOPHAGE) 850 MG tablet Take 1 tablet (850 mg total) by mouth 2 (two) times daily with a  meal. 180 tablet 3  . metoprolol tartrate (LOPRESSOR) 25 MG tablet TAKE 0.5 TABLETS (12.5 MG TOTAL) BY MOUTH 2 (TWO) TIMES DAILY. 90 tablet 3  . nitroGLYCERIN (NITROSTAT) 0.4 MG SL tablet Place 1 tablet (0.4 mg total) under the tongue every 5 (five) minutes as needed for chest pain (max 3 doses). 25 tablet 6  . vitamin B-12 (CYANOCOBALAMIN) 1000 MCG tablet Take 2 tablets (2,000 mcg total) by mouth daily.     No current facility-administered medications on file prior to visit.     Allergies  Allergen Reactions  . Aspirin     Bleeding at high dose  . Statins     Other reaction(s): Other (See Comments) Other Reaction: MYALGIA Muscle pain    Past Medical History:  Diagnosis Date  . Anginal pain ([place])   . Anxiety   . Chest pain   . 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 Diabetes mellitus   . Diabetic neuropathy ([place])   . Diverticulosis   . Diverticulosis   . Erectile dysfunction   . Family history of anesthesia complication    ' they cant wake my brother very easy"  . Fatty liver   . Flank pain   . GERD (  gastroesophageal reflux disease)   . Heart murmur   . History of bronchitis   . History of chicken pox   . 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 Hypercholesterolemia   . Hypertension   . Kidney stones   . Osteoarthritis     Past Surgical History:  Procedure Laterality Date  . CARDIAC CATHETERIZATION  08/21/2013   DR Angelena Form  . CHOLECYSTECTOMY  1983  . CORONARY ARTERY BYPASS GRAFT N/A 08/23/2013   Procedure: CORONARY ARTERY BYPASS GRAFTING (CABG);  Surgeon: Grace Isaac, MD;  Location: [facility];  Service: Open Heart Surgery;  Laterality: N/A;  CABG times six utilizing the left internal mammary artery and the right greater saphenous vein harvested endoscopically  . ENDOVEIN HARVEST OF GREATER SAPHENOUS VEIN Right 08/23/2013   Procedure: ENDOVEIN HARVEST OF GREATER SAPHENOUS VEIN;  Surgeon: Grace Isaac, MD;  Location: [facility];   Service: Open Heart Surgery;  Laterality: Right;  . HERNIA REPAIR     X3  . INTRAOPERATIVE TRANSESOPHAGEAL ECHOCARDIOGRAM N/A 08/23/2013   Procedure: INTRAOPERATIVE TRANSESOPHAGEAL ECHOCARDIOGRAM;  Surgeon: Grace Isaac, MD;  Location: [facility];  Service: Open Heart Surgery;  Laterality: N/A;  . LEFT HEART CATHETERIZATION WITH CORONARY ANGIOGRAM N/A 08/21/2013   Procedure: LEFT HEART CATHETERIZATION WITH CORONARY ANGIOGRAM;  Surgeon: Burnell Blanks, MD;  Location: [facility] CATH LAB;  Service: Cardiovascular;  Laterality: N/A;    Family History  Problem Relation Age of Onset  . Stroke Mother   . Diabetes Mother   . Heart disease Father        heart failure  . Colon cancer Maternal Grandmother   . Parkinsonism Maternal Grandfather   . Stroke Paternal Grandfather   . Prostate cancer Maternal Uncle   . CAD Brother        CABG    Social History   Social History  . Marital status: Married    Spouse name: N/A  . Number of children: N/A  . Years of education: N/A   Occupational History  . Retired    Social History Main Topics  . Smoking status: Never Smoker  . Smokeless tobacco: Never Used  . Alcohol use No  . Drug use: No  . Sexual activity: Not on file   Other Topics Concern  . 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   Review of Systems  HENT: Negative.   Eyes: Negative.   Respiratory: Negative.   Cardiovascular: Negative.   Gastrointestinal: Negative.   Musculoskeletal: Negative.   Neurological: Positive for weakness. Negative for dizziness, facial asymmetry, light-headedness and headaches.  Hematological: Negative.   Psychiatric/Behavioral: Negative.   All other systems reviewed and are  negative.      Objective:    BP (!) 160/80   Pulse (!) 51   Temp 97.7 F (36.5 C)   Wt 170 lb (77.1 kg)   SpO2 98%   BMI 26.63 kg/m    Physical Exam  Constitutional: He is oriented to person, place, and time. He appears well-developed and well-nourished. No distress.  HENT:  Head: Normocephalic and atraumatic.  Eyes: Conjunctivae are normal.  Cardiovascular: Normal rate.   Pulmonary/Chest: Effort normal.  Musculoskeletal: Normal  range of motion. He exhibits no edema.  Neurological: He is alert and oriented to person, place, and time. He has normal reflexes. He displays normal reflexes. No cranial nerve deficit. He exhibits normal muscle tone. Coordination normal.  Skin: Skin is warm and dry. He is not diaphoretic.  Psychiatric: He has a normal mood and affect. His behavior is normal. Judgment and thought content normal.  Nursing note and vitals reviewed.         Assessment & Plan:   Tick bite, initial encounter - Plan: [place] mtn spotted fvr abs pnl(IgG+IgM), B. burgdorfi antibodies  Weakness - Plan: Comprehensive metabolic panel, CBC with Differential/Platelet, TSH, Vitamin B12, Vitamin D, 25-hydroxy No Follow-up on file.

## 2017-01-18 NOTE — Addendum Note (Signed)
Addended by: Marchia Bond on: 01/18/2017 01:37 PM   Modules accepted: Orders

## 2017-01-19 ENCOUNTER — Emergency Department (HOSPITAL_COMMUNITY): Payer: PPO

## 2017-01-19 ENCOUNTER — Encounter (HOSPITAL_COMMUNITY): Payer: Self-pay | Admitting: *Deleted

## 2017-01-19 ENCOUNTER — Telehealth: Payer: Self-pay

## 2017-01-19 ENCOUNTER — Telehealth: Payer: Self-pay | Admitting: Family Medicine

## 2017-01-19 ENCOUNTER — Emergency Department (HOSPITAL_COMMUNITY)
Admission: EM | Admit: 2017-01-19 | Discharge: 2017-01-20 | Disposition: A | Discharge status: Home / Self Care | Payer: PPO | Attending: Emergency Medicine | Admitting: Emergency Medicine

## 2017-01-19 DIAGNOSIS — Z7982 Long term (current) use of aspirin: Secondary | ICD-10-CM | POA: Diagnosis not present

## 2017-01-19 DIAGNOSIS — Z7984 Long term (current) use of oral hypoglycemic drugs: Secondary | ICD-10-CM | POA: Diagnosis not present

## 2017-01-19 DIAGNOSIS — M6281 Muscle weakness (generalized): Secondary | ICD-10-CM | POA: Diagnosis not present

## 2017-01-19 DIAGNOSIS — Z79899 Other long term (current) drug therapy: Secondary | ICD-10-CM | POA: Insufficient documentation

## 2017-01-19 DIAGNOSIS — I2510 Atherosclerotic heart disease of native coronary artery without angina pectoris: Secondary | ICD-10-CM | POA: Insufficient documentation

## 2017-01-19 DIAGNOSIS — R531 Weakness: Secondary | ICD-10-CM | POA: Diagnosis not present

## 2017-01-19 DIAGNOSIS — I10 Essential (primary) hypertension: Secondary | ICD-10-CM | POA: Insufficient documentation

## 2017-01-19 DIAGNOSIS — M5410 Radiculopathy, site unspecified: Secondary | ICD-10-CM | POA: Insufficient documentation

## 2017-01-19 DIAGNOSIS — Z951 Presence of aortocoronary bypass graft: Secondary | ICD-10-CM | POA: Insufficient documentation

## 2017-01-19 DIAGNOSIS — M47816 Spondylosis without myelopathy or radiculopathy, lumbar region: Secondary | ICD-10-CM | POA: Diagnosis not present

## 2017-01-19 DIAGNOSIS — R29898 Other symptoms and signs involving the musculoskeletal system: Secondary | ICD-10-CM

## 2017-01-19 DIAGNOSIS — E1149 Type 2 diabetes mellitus with other diabetic neurological complication: Secondary | ICD-10-CM | POA: Insufficient documentation

## 2017-01-19 LAB — URINALYSIS, ROUTINE W REFLEX MICROSCOPIC
Bilirubin Urine: NEGATIVE
Glucose, UA: NEGATIVE mg/dL
Hgb urine dipstick: NEGATIVE
Ketones, ur: NEGATIVE mg/dL
LEUKOCYTES UA: NEGATIVE
NITRITE: NEGATIVE
PROTEIN: NEGATIVE mg/dL
Specific Gravity, Urine: 1.017 (ref 1.005–1.030)
pH: 5 (ref 5.0–8.0)

## 2017-01-19 LAB — COMPREHENSIVE METABOLIC PANEL
ALK PHOS: 57 U/L (ref 38–126)
ALT: 17 U/L (ref 17–63)
ANION GAP: 9 (ref 5–15)
AST: 22 U/L (ref 15–41)
Albumin: 4.3 g/dL (ref 3.5–5.0)
BUN: 18 mg/dL (ref 6–20)
CALCIUM: 9.8 mg/dL (ref 8.9–10.3)
CHLORIDE: 105 mmol/L (ref 101–111)
CO2: 24 mmol/L (ref 22–32)
CREATININE: 1.04 mg/dL (ref 0.61–1.24)
Glucose, Bld: 170 mg/dL — ABNORMAL HIGH (ref 65–99)
Potassium: 3.6 mmol/L (ref 3.5–5.1)
Sodium: 138 mmol/L (ref 135–145)
Total Bilirubin: 0.7 mg/dL (ref 0.3–1.2)
Total Protein: 6.8 g/dL (ref 6.5–8.1)

## 2017-01-19 LAB — CBC
HCT: 42.9 % (ref 39.0–52.0)
HEMOGLOBIN: 14.5 g/dL (ref 13.0–17.0)
MCH: 31.5 pg (ref 26.0–34.0)
MCHC: 33.8 g/dL (ref 30.0–36.0)
MCV: 93.1 fL (ref 78.0–100.0)
PLATELETS: 167 10*3/uL (ref 150–400)
RBC: 4.61 MIL/uL (ref 4.22–5.81)
RDW: 12.9 % (ref 11.5–15.5)
WBC: 6.6 10*3/uL (ref 4.0–10.5)

## 2017-01-19 LAB — ROCKY MTN SPOTTED FVR ABS PNL(IGG+IGM)
RMSF IGM: NOT DETECTED
RMSF IgG: NOT DETECTED

## 2017-01-19 LAB — LYME AB/WESTERN BLOT REFLEX

## 2017-01-19 NOTE — Telephone Encounter (Signed)
Pt was seen 01/18/17 and had lab testing; pt received a tickbite in May. Today almost everytime pt tries to walk pt's legs feel like water, weak and no strength in legs. Pt has fallen x 3 today with no injury. Pt plans to go out of town 01/20/17 in early AM for the weekend to a [place] with family.pt will be back late on 01/22/17; pt did say if Dr Damita Dunnings thinks pt should not go to beach he will cancel his trip. Dr Damita Dunnings will review lab results and office note on 01/18/17 and pt will receive cb sometime today. Pt voiced understanding. I advised pt to rest and not be on feet any more than absolutely necessary; and pts wife has a walker and pt will use walker to try not to fall if he has to be up.

## 2017-01-19 NOTE — ED Triage Notes (Signed)
The p[t has had bi-lateral leg weakness for one week.  He has fallen and he was sent by hisa doctor to r/o a neurologiical disorder alert oriented skin warm and dry.

## 2017-01-19 NOTE — Telephone Encounter (Signed)
Error

## 2017-01-19 NOTE — Telephone Encounter (Signed)
Patient advised and states he will probably go to the ER.

## 2017-01-19 NOTE — ED Provider Notes (Signed)
[facility] DEPT Provider Note   CSN: [id number] Arrival date & time: 01/19/17  1911    History   Chief Complaint Chief Complaint  Patient presents with  . Weakness    HPI [name] [name] is a 76 y.o. male.  76 year old male with a history of coronary artery disease, diabetes mellitus, dyslipidemia, and hypertension presents to the emergency department for evaluation of bilateral lower extremity weakness. Patient states that he was working in the garden 9 days ago when he went to get up and suddenly fell. He states that he noticed inability to move his legs at this time. He states that he needed to "shake them around". He was eventually able to stand and progressively able to ambulate independently. Symptoms lasted approximately 15 minutes before he recalls returning to baseline. He has had no repeat symptoms until yesterday. Patient states that he noticed bilateral lower extremity weakness again when he went to get out of bed, planting his feet on the floor. He states that his legs feel "wobbly". He reports this being unusual for him as he is very active, working out in the gym proximally 5 days per week. He has noticed that his weakness has remained constant since recurrence 2 days ago. He states that he has been able to "compensate" for this weakness with different maneuvers, especially during ambulation. He does note a burning discomfort in his low back as well as around his bilateral groin. This is new for him. He has had no urinary incontinence, but has a sensation of incomplete stooling. He has not had any bowel incontinence. No recent fevers or associated numbness or paresthesias. Patient does have history of a tick bite 3 weeks ago, but had negative Lyme and RMSF titers performed by his primary doctor. He denies any trauma or injury to his back. No history of back surgery. He was encouraged to come to the emergency department after speaking with his primary provider.      Past Medical  History:  Diagnosis Date  . Anginal pain ([place])   . Anxiety   . Chest pain   . 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 Diabetes mellitus   . Diabetic neuropathy ([place])   . Diverticulosis   . Diverticulosis   . Erectile dysfunction   . Family history of anesthesia complication    ' they cant wake my brother very easy"  . Fatty liver   . Flank pain   . GERD (gastroesophageal reflux disease)   . Heart murmur   . History of bronchitis   . History of chicken pox   . 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 Hypercholesterolemia   . Hypertension   . Kidney stones   . Osteoarthritis     Patient Active Problem List   Diagnosis Date Noted  . Tick bites 01/18/2017  . Weakness 01/18/2017  . Arm pain 08/05/2016  . Prostate cancer screening 08/04/2016  . HTN (hypertension) 08/04/2016  . Rectal pain 02/18/2016  . Plantar fasciitis 02/18/2016  . AK (actinic keratosis) 02/02/2016  . Glaucoma suspect 02/02/2016  . Anxiety state 02/19/2015  . Exertional chest pain 02/19/2015  . Advance care planning 07/15/2014  . Atrial fibrillation ([place]) 08/28/2013  . 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 Unstable angina ([place]) 08/21/2013  . 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 Abdominal pain, other specified site 04/24/2013  . Pelvic pain 04/24/2013  . Osteoporosis 06/16/2012  . Medicare annual  wellness visit, subsequent 06/12/2012  . Cough 05/29/2012  . 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 Neck pain 12/12/2011  . Foreign body 12/12/2011  . Chest pain 11/29/2010  . Hypercholesteremia 11/29/2010  . Murmur 11/29/2010    Past Surgical History:  Procedure Laterality Date  . CARDIAC CATHETERIZATION  08/21/2013   DR Angelena Form  . CHOLECYSTECTOMY  1983  . CORONARY ARTERY BYPASS GRAFT N/A 08/23/2013   Procedure: CORONARY ARTERY BYPASS GRAFTING (CABG);  Surgeon: Grace Isaac, MD;  Location: [facility];  Service: Open Heart Surgery;  Laterality: N/A;  CABG  times six utilizing the left internal mammary artery and the right greater saphenous vein harvested endoscopically  . ENDOVEIN HARVEST OF GREATER SAPHENOUS VEIN Right 08/23/2013   Procedure: ENDOVEIN HARVEST OF GREATER SAPHENOUS VEIN;  Surgeon: Grace Isaac, MD;  Location: [facility];  Service: Open Heart Surgery;  Laterality: Right;  . HERNIA REPAIR     X3  . INTRAOPERATIVE TRANSESOPHAGEAL ECHOCARDIOGRAM N/A 08/23/2013   Procedure: INTRAOPERATIVE TRANSESOPHAGEAL ECHOCARDIOGRAM;  Surgeon: Grace Isaac, MD;  Location: [facility];  Service: Open Heart Surgery;  Laterality: N/A;  . LEFT HEART CATHETERIZATION WITH CORONARY ANGIOGRAM N/A 08/21/2013   Procedure: LEFT HEART CATHETERIZATION WITH CORONARY ANGIOGRAM;  Surgeon: Burnell Blanks, MD;  Location: [facility] CATH LAB;  Service: Cardiovascular;  Laterality: N/A;       Home Medications    Prior to Admission medications   Medication Sig Start Date End Date Taking? Authorizing Provider  acetaminophen (TYLENOL) 500 MG tablet Take 1 tablet (500 mg total) by mouth every 6 (six) hours as needed for mild pain or moderate pain. 01/03/17  Yes Waynetta Pean, PA-C  acyclovir (ZOVIRAX) 800 MG tablet Take 1 tablet (800 mg total) by mouth 2 (two) times daily. 08/02/16  Yes Tonia Ghent, MD  aspirin EC 81 MG tablet Take 1 tablet (81 mg total) by mouth daily. 08/30/13  Yes Barrett, Erin R, PA-C  diclofenac sodium (VOLTAREN) 1 % GEL Apply 4 g topically 4 (four) times daily as needed. 08/02/16  Yes Tonia Ghent, MD  erythromycin ophthalmic ointment Place 1 application into the left eye every 6 (six) hours. Place 1/2 inch ribbon of ointment in the affected eye 4 times a day 01/03/17  Yes Waynetta Pean, PA-C  fenofibrate 160 MG tablet Take 1 tablet (160 mg total) by mouth at bedtime. 08/02/16  Yes Tonia Ghent, MD  gabapentin (NEURONTIN) 600 MG tablet Take 1 tablet (600 mg total) by mouth 3 (three) times daily. 08/02/16  Yes Tonia Ghent, MD    latanoprost (XALATAN) 0.005 % ophthalmic solution Place 1 drop into the right eye at bedtime.   Yes [provider]  losartan (COZAAR) 50 MG tablet Take 1 tablet (50 mg total) by mouth daily. 08/02/16  Yes Tonia Ghent, MD  metFORMIN (GLUCOPHAGE) 850 MG tablet Take 1 tablet (850 mg total) by mouth 2 (two) times daily with a meal. 08/02/16  Yes Tonia Ghent, MD  metoprolol tartrate (LOPRESSOR) 25 MG tablet TAKE 0.5 TABLETS (12.5 MG TOTAL) BY MOUTH 2 (TWO) TIMES DAILY. 08/02/16  Yes Tonia Ghent, MD  nitroGLYCERIN (NITROSTAT) 0.4 MG SL tablet Place 1 tablet (0.4 mg total) under the tongue every 5 (five) minutes as needed for chest pain (max 3 doses). 03/02/15  Yes Burnell Blanks, MD  vitamin B-12 (CYANOCOBALAMIN) 1000 MCG tablet Take 2 tablets (2,000 mcg total) by mouth daily. 08/02/16  Yes Tonia Ghent,  MD  acyclovir (ZOVIRAX) 800 MG tablet TAKE 1 TABLET (800 MG TOTAL) BY MOUTH 2 (TWO) TIMES DAILY. Patient not taking: Reported on 01/19/2017 10/28/16   Tonia Ghent, MD    Family History Family History  Problem Relation Age of Onset  . Stroke Mother   . Diabetes Mother   . Heart disease Father        heart failure  . Colon cancer Maternal Grandmother   . Parkinsonism Maternal Grandfather   . Stroke Paternal Grandfather   . Prostate cancer Maternal Uncle   . CAD Brother        CABG    Social History Social History  Substance Use Topics  . Smoking status: Never Smoker  . Smokeless tobacco: Never Used  . Alcohol use No     Allergies   Statins   Review of Systems Review of Systems Ten systems reviewed and are negative for acute change, except as noted in the HPI.    Physical Exam Updated Vital Signs BP (!) 191/86 (BP Location: Right Arm)   Pulse 62   Temp 98.4 F (36.9 C)   Resp 16   Ht 5\' 7"  (1.702 m)   Wt 79.5 kg (175 lb 4 oz)   SpO2 97%   BMI 27.45 kg/m   Physical Exam  Constitutional: He is oriented to person, place, and time.  He appears well-developed and well-nourished. No distress.  Pleasant and in NAD  HENT:  Head: Normocephalic and atraumatic.  Eyes: Conjunctivae and EOM are normal. No scleral icterus.  Neck: Normal range of motion.  Cardiovascular: Normal rate, regular rhythm and intact distal pulses.   DP pulse 2+ in BLE  Pulmonary/Chest: Effort normal. No respiratory distress. He has no wheezes.  Respirations even and unlabored  Musculoskeletal: Normal range of motion.  Neurological: He is alert and oriented to person, place, and time. Coordination normal.  Reflex Scores:      Patellar reflexes are 1+ on the right side and 3+ on the left side. Ambulatory with steady gait. Patient kicks out left leg with ambulation. Sensation intact and equal in BLE. 5/5 strength in BLE. Reflexes intact; unequal.  Skin: Skin is warm and dry. No rash noted. He is not diaphoretic. No erythema. No pallor.  Psychiatric: He has a normal mood and affect. His behavior is normal.  Nursing note and vitals reviewed.    ED Treatments / Results  Labs (all labs ordered are listed, but only abnormal results are displayed) Labs Reviewed  COMPREHENSIVE METABOLIC PANEL - Abnormal; Notable for the following:       Result Value   Glucose, Bld 170 (*)    All other components within normal limits  CBC  URINALYSIS, ROUTINE W REFLEX MICROSCOPIC    EKG  EKG Interpretation None       Radiology Mr Lumbar Spine W Wo Contrast  Result Date: 01/20/2017 CLINICAL DATA:  Bilateral lower extremity weakness for 1 week. Assess for neurological disorder. History of diabetes. EXAM: MRI LUMBAR SPINE WITHOUT AND WITH CONTRAST TECHNIQUE: Multiplanar and multiecho pulse sequences of the lumbar spine were obtained without and with intravenous contrast. CONTRAST:  25mL MULTIHANCE GADOBENATE DIMEGLUMINE 529 MG/ML IV SOLN COMPARISON:  MRI of the lumbar spine January 18, 2011 FINDINGS: SEGMENTATION: Transitional anatomy, lumbarized S 1 vertebral body with  normal S1-2 disc, please note this areas from prior numbering convention. ALIGNMENT: Maintenance of the lumbar lordosis. Minimal grade 1 L5-S1 anterolisthesis. VERTEBRAE:Vertebral bodies are intact. Intervertebral discs demonstrate normal morphology, decreased  T2 signal within all disc compatible with desiccation, with exception of S1-2 transitional disc. Multilevel mild to moderate subacute on chronic discogenic endplate changes. Subacute L2 inferior endplate enhancing Schmorl's nodes, associated focal disc edema and faint enhancement. No suspicious osseous or disc enhancement. CONUS MEDULLARIS: Conus medullaris terminates at T12-L1 and demonstrates normal morphology and signal characteristics. Cauda equina is normal. No abnormal cord, leptomeningeal or epidural enhancement. PARASPINAL AND SOFT TISSUES: Included prevertebral and paraspinal soft tissues are normal. DISC LEVELS: L1-2 and L2-3: No disc bulge, canal stenosis nor neural foraminal narrowing. L3-4: Small broad-based disc bulge with enhancing annular fissure. Mild facet arthropathy and ligamentum flavum redundancy without canal stenosis. Mild LEFT neural foraminal narrowing. L4-5: Minimal broad-based disc bulge with undersurface enhancing annular fissure. Mild facet arthropathy and ligamentum flavum redundancy without canal stenosis or neural foraminal narrowing. L5-S1: Anterolisthesis. Annular bulging. Moderate to severe facet arthropathy without canal stenosis. Minimal neural foraminal narrowing. IMPRESSION: Transitional anatomy. Minimal grade 1 L5-S1 anterolisthesis without spondylolysis. Slightly progressed degenerative change of the lumbar spine. Multilevel enhancing annular fissures. No canal stenosis. Minimal to mild L3-4 and L5-S1 neural foraminal narrowing. Electronically Signed   By: Elon Alas M.D.   On: 01/20/2017 00:30    Procedures Procedures (including critical care time)  Medications Ordered in ED Medications  gadobenate  dimeglumine (MULTIHANCE) injection 15 mL (15 mLs Intravenous Contrast Given 01/20/17 0008)    12:45 AM Case discussed with Dr. Nicole Kindred of neurology. I have informed Dr. Nicole Kindred at the patient's onset of symptoms as well as the intermittent nature of weakness with 2 days of constant symptoms. Physical exam findings explained including asymmetric reflexes and settle gait changes. Dr. Nicole Kindred made aware of the patient's MRI results. Dr. Nicole Kindred does not believe further workup with a lumbar puncture is indicated. He conveys that Raynald Blend is very unlikely, especially given that reflexes are present. Dr. Nicole Kindred suspects that symptoms may be due to a radiculopathy. He recommends outpatient Neurology follow up and completion of a nerve conduction study. Dr. Nicole Kindred does not believe further emergent work up or admission is indicated at this time.   Initial Impression / Assessment and Plan / ED Course  I have reviewed the triage vital signs and the nursing notes.  Pertinent labs & imaging results that were available during my care of the patient were reviewed by me and considered in my medical decision making (see chart for details).     76 year old male presents to the emergency department for evaluation of lower extremity weakness. Symptoms initially began 9 days ago and resolved after 15 minutes. They returned 48 hours ago and have been constant since. No appreciable weakness on exam. Reflexes are asymmetric. Patient noted to have subtle gait changes, though he is ambulatory without assistance and is fairly stable. Laboratory workup performed in triage reassuring.  Given history and symptoms, MRI performed. This shows no emergent or surgical cause of the patient's weakness. No evidence of cauda equina. No epidural abscess. No demyelination or any bony changes.  Case discussed with Dr. Nicole Kindred of the Neurohospitalist service. Dr. Nicole Kindred believes the patient is stable for further workup on an  outpatient basis given his reassuring tests and imaging today. Imaging findings reviewed with the patient as well as plan for outpatient follow-up. He verbalizes understanding and comfort with plan. Return precautions discussed and provided. Patient discharged in stable condition with no unaddressed concerns.   Final Clinical Impressions(s) / ED Diagnoses   Final diagnoses:  Lower extremity weakness  Radiculopathy with lower extremity symptoms    New Prescriptions New Prescriptions   No medications on file     Antonietta Breach, Hershal Coria 01/20/17 0107    Duffy Bruce, MD 01/21/17 1353

## 2017-01-19 NOTE — Telephone Encounter (Signed)
Please call pt.   This is clearly not normal and not the typical baseline for this patient.  His labs are fine, which is potentially troubling because it means something else (that wouldn't have been addressed by the labs) could be going on.   My concern is with bilateral weakness in the legs (that is getting worse) he may have Guillain-Barre syndrome. I can't guarantee that is what is going on, but if his legs are worse then he needs to be seen tonight, either ER or UC.   I wouldn't want until tomorrow.   I don't think it is wise to leave town at this point.

## 2017-01-20 DIAGNOSIS — M47816 Spondylosis without myelopathy or radiculopathy, lumbar region: Secondary | ICD-10-CM | POA: Diagnosis not present

## 2017-01-20 MED ORDER — GADOBENATE DIMEGLUMINE 529 MG/ML IV SOLN
15.0000 mL | Freq: Once | INTRAVENOUS | Status: AC | PRN
  Administered 2017-01-20: 15 mL via INTRAVENOUS

## 2017-01-20 NOTE — Discharge Instructions (Signed)
Follow-up with [facility] for further evaluation of your symptoms. You would likely benefit from a nerve conduction study which can be completed by a neurologist.   Your symptoms may be due to peripheral neuropathy/radiculopathy. Your MRI today did not show any compression on your spinal cord or any changes to your nerves. You may continue follow-up with your primary care doctor. Return to the emergency department, as needed, for new or concerning symptoms.

## 2017-01-22 ENCOUNTER — Other Ambulatory Visit: Payer: Self-pay | Admitting: Family Medicine

## 2017-01-22 DIAGNOSIS — M5410 Radiculopathy, site unspecified: Secondary | ICD-10-CM

## 2017-01-23 ENCOUNTER — Encounter: Payer: Self-pay | Admitting: Family Medicine

## 2017-01-23 ENCOUNTER — Ambulatory Visit (INDEPENDENT_AMBULATORY_CARE_PROVIDER_SITE_OTHER): Payer: PPO | Admitting: Family Medicine

## 2017-01-23 DIAGNOSIS — R269 Unspecified abnormalities of gait and mobility: Secondary | ICD-10-CM

## 2017-01-23 MED ORDER — METOPROLOL TARTRATE 25 MG PO TABS: 25.0000 mg | ORAL_TABLET | Freq: Two times a day (BID) | ORAL | Status: DC

## 2017-01-23 NOTE — Progress Notes (Signed)
B leg weakness started about 2.5 weeks ago, initially would last a few minutes, then gradually got worse and more prolonged. He is now some better in the meantime but clearly not back to baseline.    He had an antecedent tick bite but tick related labs were unremarkable, negative, d/w pt.   His BP has been up in the meantime, since all of the sx started.  No fevers >100.  He has noted sensation of palpitations episodically.  He admits to being antsy about his BP elevation.    His legs still feel weak, even if able to bear weight.  He can't walk as fast as previously.  "I'm aiming when I'm walking, not just walking likely normal.  My legs feel like jelly."  He prev had trouble getting out of chair w/o using his hands but that is some better now.    Some lower L sided back noted.  That area tends to get irritated and painful if he gets "uptight" about something.  It improves when he relaxes.  That has been going on for years.    He doesn't have vision loss but had change in focal length recently, using a different line in his trifocals for reading.  His distance vision is still fine.    PMH and SH reviewed  ROS: Per HPI unless specifically indicated in ROS section   Meds, vitals, and allergies reviewed.   GEN: nad, alert and oriented HEENT: mucous membranes moist NECK: supple w/o LA CV: rrr.  no murmur PULM: ctab, no inc wob ABD: soft, +bs EXT: no edema SKIN: no acute rash CN 2-12 wnl B, S/S wnl x4 but patellar DTRs asymmetric, L clearly >R patellar DRTs.  BUE DTRs wnl and equal.

## 2017-01-23 NOTE — Patient Instructions (Signed)
Rosaria Ferries called about your referral.  Increase your metoprolol to 1 tab twice a day.  Update me in a few days about your blood pressure.  Take care.  Glad to see you.

## 2017-01-24 DIAGNOSIS — R269 Unspecified abnormalities of gait and mobility: Secondary | ICD-10-CM | POA: Insufficient documentation

## 2017-01-24 NOTE — Assessment & Plan Note (Signed)
Concern was for radicular source. Unlikely to be tick related. Discussed with patient. He needs outpatient neurology follow-up. Referred. Would not change his medications at this time other than to increase his beta blocker since he has had elevated blood pressure and occasional sensation of palpations. See after visit summary. At this point he is not actively worsening and is okay for outpatient follow-up. He is thought not to have Guillain-Barr syndrome since he still has reflexes present and he has had fluctuating symptoms. >25 minutes spent in face to face time with patient, >50% spent in counselling or coordination of care.

## 2017-01-26 ENCOUNTER — Telehealth: Payer: Self-pay | Admitting: Family Medicine

## 2017-01-26 NOTE — Telephone Encounter (Signed)
[facility] called - she has an ed disposition for weakness and burning abdominal pain.  Pt is refusing to go to ed. TH will send over report   Thanks

## 2017-01-26 NOTE — Telephone Encounter (Signed)
I spoke with pt; pt is not gong to ED and wants appt with Dr Damita Dunnings; and his stomach has been burning but took med and that is feeling better; not stable when walking, holds to wall; several nights ago had night sweats. When asked if he had CP pt said"nothing to brag about". Tick bite 12/26/16 and pt having weakness. Dr Damita Dunnings request note sent to him for review.

## 2017-01-26 NOTE — Telephone Encounter (Signed)
If this feels like heartburn and clearly responds to OTC antacid, then that is reassuring.  Please find out what med he used.  The leg weakness is expected and I want him to f/u with neurology about that.  If he has chest pain otherwise that is progressive, then he needs ER eval.  If that isn't the case, and if no emergent sx o/w, then reasonable to continue with otc antacid in the meantime and get checked when possible/feasible.  Thanks.

## 2017-01-26 NOTE — Telephone Encounter (Signed)
Patient Name: [name] [name] DOB: 1941/07/16 Initial Comment Caller states c/o weakness, stomach is on fire and pain. He had a tick bite 05/21. Nurse Assessment Nurse: Sherrell Puller, RN, Amy Date/Time Eilene Ghazi Time): 01/26/2017 12:53:34 PM Confirm and document reason for call. If symptomatic, describe symptoms. ---Caller states he has been having weakness all over and stomach pain since yesterday, says it feels like his stomach is on "fire". No fever. Prior tick bite on 5/21. Does the patient have any new or worsening symptoms? ---Yes Will a triage be completed? ---Yes Related visit to physician within the last 2 weeks? ---No Does the PT have any chronic conditions? (i.e. diabetes, asthma, etc.) ---Yes List chronic conditions. ---HTN Is this a behavioral health or substance abuse call? ---No Guidelines Guideline Title Affirmed Question Affirmed Notes Abdominal Pain - Male [1] SEVERE pain (e.g., excruciating) AND [2] present > 1 hour Final Disposition User Go to ED Now Sherrell Puller, RN, Amy Referrals GO TO FACILITY REFUSED Disagree/Comply: Disagree Disagree/Comply Reason: Disagree with instructions

## 2017-01-26 NOTE — Telephone Encounter (Signed)
Patient says that he was up all night last night with his stomach burning, not his chest.  His wife gave him:  Engineer, production Tabs this morning and he took some Ginger Root and it helped tremendously.  Patient wanted to schedule OV for tomorrow but Dr. Damita Dunnings his fully booked and patient says he will continue with this medication and if not better, will call tomorrow for an appointment on Monday.

## 2017-01-27 NOTE — Telephone Encounter (Signed)
Noted. Thanks.

## 2017-02-09 ENCOUNTER — Other Ambulatory Visit: Payer: Self-pay | Admitting: Family Medicine

## 2017-02-09 MED ORDER — METOPROLOL TARTRATE 25 MG PO TABS: 25.0000 mg | ORAL_TABLET | Freq: Two times a day (BID) | ORAL | 3 refills | Status: DC

## 2017-02-09 NOTE — Telephone Encounter (Signed)
Patient came in office and filled out the Triage/Patient request form.  Patient is stating that Dr Damita Dunnings is supposed to refill and increase dosage of the metoprolol tartrate.  On the bottle that was attached to form (metoprolol 25 mg tablet), it states, "Take 0.5 tablet (12.5 mg total) by mouth 2 times daily.  Please advise. Last seen on 01/23/2017.

## 2017-02-09 NOTE — Telephone Encounter (Signed)
We talked about increasing his dose previously. Prescription sent. Thanks.

## 2017-02-16 ENCOUNTER — Ambulatory Visit (HOSPITAL_COMMUNITY): Payer: PPO | Attending: Cardiology

## 2017-02-16 ENCOUNTER — Other Ambulatory Visit: Payer: Self-pay

## 2017-02-16 DIAGNOSIS — I5030 Unspecified diastolic (congestive) heart failure: Secondary | ICD-10-CM | POA: Diagnosis not present

## 2017-02-16 DIAGNOSIS — I25810 Atherosclerosis of coronary artery bypass graft(s) without angina pectoris: Secondary | ICD-10-CM | POA: Diagnosis not present

## 2017-02-16 DIAGNOSIS — I420 Dilated cardiomyopathy: Secondary | ICD-10-CM | POA: Diagnosis not present

## 2017-02-16 DIAGNOSIS — I351 Nonrheumatic aortic (valve) insufficiency: Secondary | ICD-10-CM

## 2017-02-16 DIAGNOSIS — I061 Rheumatic aortic insufficiency: Secondary | ICD-10-CM | POA: Insufficient documentation

## 2017-02-21 ENCOUNTER — Telehealth: Payer: Self-pay | Admitting: Internal Medicine

## 2017-02-21 NOTE — Telephone Encounter (Signed)
Patient's wife reports that he has been having abdominal pain and "digestive issues".  He will come in and see Ellouise Newer, [place] on 02/21/17 1:45

## 2017-02-23 ENCOUNTER — Encounter: Payer: Self-pay | Admitting: Physician Assistant

## 2017-02-23 ENCOUNTER — Ambulatory Visit (INDEPENDENT_AMBULATORY_CARE_PROVIDER_SITE_OTHER): Payer: PPO | Admitting: Physician Assistant

## 2017-02-23 VITALS — BP 160/64 | HR 60 | Ht 66.75 in | Wt 165.0 lb

## 2017-02-23 DIAGNOSIS — R102 Pelvic and perineal pain: Secondary | ICD-10-CM

## 2017-02-23 DIAGNOSIS — K219 Gastro-esophageal reflux disease without esophagitis: Secondary | ICD-10-CM

## 2017-02-23 DIAGNOSIS — R194 Change in bowel habit: Secondary | ICD-10-CM

## 2017-02-23 MED ORDER — NA SULFATE-K SULFATE-MG SULF 17.5-3.13-1.6 GM/177ML PO SOLN: 1.0000 | ORAL | 0 refills | Status: DC

## 2017-02-23 NOTE — Patient Instructions (Signed)

## 2017-02-23 NOTE — Progress Notes (Signed)
Chief Complaint: Abdominal Pain, GERD, Change in Bowel habits  HPI:  Mr. Cordrey is a 76 year old male with a past medical history of anginal pain (recent echo 02/16/17 with normal LVEF and 55-60%), diabetes, diverticulosis, fatty liver, GERD, heart murmur and others listed below,  who was referred to me by Tonia Ghent, MD for a complaint of abdominal pain, GERD and a change in bowel habits .      Patient previously followed with Dr. Olevia Perches but has not been seen in clinic since 2014. Last colonoscopy was completed 07/11/13 with findings of a sessile polyp in the rectum and moderate diverticulosis throughout the entire colon. It was recommended patient have repeat in 5 years. Last EGD was performed 09/29/09 with findings of mild gastritis, sessile polyp in the fundus and normal GE junction.   Today, the patient presents to clinic and tells me that he has had a change in his bowel habits over the past year and now is having occasional diarrhea. This has been off and on and he is not sure he can related this to his diet. This is new for him as prior to this time he had daily solid stools. He does use a daily fiber supplement and a probiotic which he has been on forever.   Most concerning to the patient, he has developed a pain in his lower abdomen, he points to his suprapubic region and tells me that it is "slightly deeper than that towards his back. This has been increasing over the past couple of months. He tells me this is separate from the back pain that he feels and was evaluated in the ER for recently. This pain can last for 2-1/2-3 hours or 20-30 minutes or a few days. He tells me it feels as though someone "grabs my abdomen and tightens it". Says it feels like, all the blood is being cut off from that area for that period of time. This is a severe pain for him.   Patient's wife is with him and describes symptoms of reflux for which she gives him an all natural reflux relief. She was doing this every  day for about a week a couple of months ago, but patient has not required as much recently. He does not complain of reflux.   Patient denies fever, chills, blood in his stool, melena, weight loss, fatigue, anorexia, nausea, vomiting or symptoms that awaken him at night.    Past Medical History:  Diagnosis Date  . Anginal pain ([place])   . Anxiety   . Chest pain   . 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 Diabetes mellitus   . Diabetic neuropathy ([place])   . Diverticulosis   . Diverticulosis   . Erectile dysfunction   . Family history of anesthesia complication    ' they cant wake my brother very easy"  . Fatty liver   . Flank pain   . GERD (gastroesophageal reflux disease)   . Heart murmur   . History of bronchitis   . History of chicken pox   . 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 Hypercholesterolemia   . Hypertension   . Kidney stones   . Osteoarthritis     Past Surgical History:  Procedure Laterality Date  . CARDIAC CATHETERIZATION  08/21/2013   DR Angelena Form  . CHOLECYSTECTOMY  1983  . CORONARY ARTERY BYPASS GRAFT N/A 08/23/2013   Procedure: CORONARY ARTERY BYPASS GRAFTING (CABG);  Surgeon:  Grace Isaac, MD;  Location: [facility];  Service: Open Heart Surgery;  Laterality: N/A;  CABG times six utilizing the left internal mammary artery and the right greater saphenous vein harvested endoscopically  . ENDOVEIN HARVEST OF GREATER SAPHENOUS VEIN Right 08/23/2013   Procedure: ENDOVEIN HARVEST OF GREATER SAPHENOUS VEIN;  Surgeon: Grace Isaac, MD;  Location: [facility];  Service: Open Heart Surgery;  Laterality: Right;  . HERNIA REPAIR     X3  . INTRAOPERATIVE TRANSESOPHAGEAL ECHOCARDIOGRAM N/A 08/23/2013   Procedure: INTRAOPERATIVE TRANSESOPHAGEAL ECHOCARDIOGRAM;  Surgeon: Grace Isaac, MD;  Location: [facility];  Service: Open Heart Surgery;  Laterality: N/A;  . LEFT HEART CATHETERIZATION WITH CORONARY ANGIOGRAM N/A 08/21/2013   Procedure: LEFT  HEART CATHETERIZATION WITH CORONARY ANGIOGRAM;  Surgeon: Burnell Blanks, MD;  Location[facility] C[facility];  Service: Cardiovascular;  Laterality: N/A;    Current Outpatient Prescriptions  Medication Sig Dispense Refill  . acyclovir (ZOVIRAX) 800 MG tablet Take 1 tablet (800 mg total) by mouth 2 (two) times daily. 180 tablet 3  . aspirin EC 81 MG tablet Take 1 tablet (81 mg total) by mouth daily.    . diclofenac sodium (VOLTAREN) 1 % GEL Apply 4 g topically 4 (four) times daily as needed. 300 g prn  . erythromycin ophthalmic ointment Place 1 application into the left eye every 6 (six) hours. Place 1/2 inch ribbon of ointment in the affected eye 4 times a day 1 g 1  . fenofibrate 160 MG tablet Take 1 tablet (160 mg total) by mouth at bedtime. 90 tablet 3  . gabapentin (NEURONTIN) 600 MG tablet Take 1 tablet (600 mg total) by mouth 3 (three) times daily. 270 tablet 3  . latanoprost (XALATAN) 0.005 % ophthalmic solution Place 1 drop into the right eye at bedtime.    Marland Kitchen losartan (COZAAR) 50 MG tablet Take 1 tablet (50 mg total) by mouth daily. 90 tablet 3  . metFORMIN (GLUCOPHAGE) 850 MG tablet Take 1 tablet (850 mg total) by mouth 2 (two) times daily with a meal. 180 tablet 3  . metoprolol tartrate (LOPRESSOR) 25 MG tablet Take 1 tablet (25 mg total) by mouth 2 (two) times daily. 180 tablet 3  . nitroGLYCERIN (NITROSTAT) 0.4 MG SL tablet Place 1 tablet (0.4 mg total) under the tongue every 5 (five) minutes as needed for chest pain (max 3 doses). 25 tablet 6   No current facility-administered medications for this visit.     Allergies as of 02/23/2017 - Review Complete 02/23/2017  Allergen Reaction Noted  . Statins Other (See Comments) 12/08/2011    Family History  Problem Relation Age of Onset  . Stroke Mother   . Diabetes Mother   . Heart disease Father        heart failure  . Colon cancer Maternal Grandmother   . Parkinsonism Maternal Grandfather   . Stroke Paternal Grandfather   .  Prostate cancer Maternal Uncle   . CAD Brother        CABG    Social History   Social History  . Marital status: Married    Spouse name: N/A  . Number of children: N/A  . Years of education: N/A   Occupational History  . Retired    Social History Main Topics  . Smoking status: Never Smoker  . Smokeless tobacco: Never Used  . Alcohol use No  . Drug use: No  . Sexual activity: Not on file   Other Topics Concern  .  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, worked for [employer], Designer, industrial/product    Review of Systems:    Constitutional: No weight loss, fever, chills, weakness or fatigue Skin: No rash  Cardiovascular: No chest pain Respiratory: No SOB  Gastrointestinal: See HPI and otherwise negative Genitourinary: Positive for some dysuria Neurological: No headache Musculoskeletal: No new muscle or joint pain Hematologic: No bleeding  Psychiatric: No history of depression or anxiety   Physical Exam:  Vital signs: BP (!) 160/64 (BP Location: Left Arm, Patient Position: Sitting, Cuff Size: Normal)   Pulse 60   Ht 5' 6.75" (1.695 m) Comment: height measured without shoes  Wt 165 lb (74.8 kg)   BMI 26.04 kg/m    Constitutional: Caucasian male appears to be in NAD, Well developed, Well nourished, alert and cooperative Head:  Normocephalic and atraumatic. Eyes:   PEERL, EOMI. No icterus. Conjunctiva pink. Ears:  Normal auditory acuity. Neck:  Supple Throat: Oral cavity and pharynx without inflammation, swelling or lesion.  Respiratory: Respirations even and unlabored. Lungs clear to auscultation bilaterally.   No wheezes, crackles, or rhonchi.  Cardiovascular: Normal S1, S2. No MRG. Regular rate and rhythm. No peripheral edema, cyanosis or pallor.  Gastrointestinal:  Soft, nondistended, moderate ttp in suprapubic area No rebound or guarding. Normal bowel sounds. No  appreciable masses or hepatomegaly. Rectal:  Not performed.  Msk:  Symmetrical without gross deformities. Without edema, no deformity or joint abnormality.  Neurologic:  Alert and  oriented x4;  grossly normal neurologically.  Skin:   Dry and intact without significant lesions or rashes. Psychiatric: . Demonstrates good judgement and reason without abnormal affect or behaviors.  MOST RECENT LABS AND IMAGING: CBC    Component Value Date/Time   WBC 6.6 01/19/2017 1931   RBC 4.61 01/19/2017 1931   HGB 14.5 01/19/2017 1931   HCT 42.9 01/19/2017 1931   PLT 167 01/19/2017 1931   MCV 93.1 01/19/2017 1931   MCH 31.5 01/19/2017 1931   MCHC 33.8 01/19/2017 1931   RDW 12.9 01/19/2017 1931   LYMPHSABS 2.3 01/18/2017 1316   MONOABS 0.4 01/18/2017 1316   EOSABS 0.2 01/18/2017 1316   BASOSABS 0.0 01/18/2017 1316    CMP     Component Value Date/Time   NA 138 01/19/2017 1931   K 3.6 01/19/2017 1931   CL 105 01/19/2017 1931   CO2 24 01/19/2017 1931   GLUCOSE 170 (H) 01/19/2017 1931   BUN 18 01/19/2017 1931   CREATININE 1.04 01/19/2017 1931   CALCIUM 9.8 01/19/2017 1931   PROT 6.8 01/19/2017 1931   ALBUMIN 4.3 01/19/2017 1931   AST 22 01/19/2017 1931   ALT 17 01/19/2017 1931   ALKPHOS 57 01/19/2017 1931   BILITOT 0.7 01/19/2017 1931   GFRNONAA >60 01/19/2017 1931   GFRAA >60 01/19/2017 1931   EXAM: MRI LUMBAR SPINE WITHOUT AND WITH CONTRAST  TECHNIQUE: Multiplanar and multiecho pulse sequences of the lumbar spine were obtained without and with intravenous contrast.  CONTRAST:  71mL MULTIHANCE GADOBENATE DIMEGLUMINE 529 MG/ML IV SOLN  COMPARISON:  MRI of the lumbar spine January 18, 2011  FINDINGS: SEGMENTATION: Transitional anatomy, lumbarized S 1 vertebral body with normal S1-2 disc, please note this areas from prior numbering convention.  ALIGNMENT: Maintenance of the lumbar lordosis. Minimal grade 1 L5-S1 anterolisthesis.  VERTEBRAE:Vertebral bodies are intact.  Intervertebral discs demonstrate normal morphology,  decreased T2 signal within all disc compatible with desiccation, with exception of S1-2 transitional disc. Multilevel mild to moderate subacute on chronic discogenic endplate changes. Subacute L2 inferior endplate enhancing Schmorl's nodes, associated focal disc edema and faint enhancement. No suspicious osseous or disc enhancement.  CONUS MEDULLARIS: Conus medullaris terminates at T12-L1 and demonstrates normal morphology and signal characteristics. Cauda equina is normal. No abnormal cord, leptomeningeal or epidural enhancement.  PARASPINAL AND SOFT TISSUES: Included prevertebral and paraspinal soft tissues are normal.  DISC LEVELS:  L1-2 and L2-3: No disc bulge, canal stenosis nor neural foraminal narrowing.  L3-4: Small broad-based disc bulge with enhancing annular fissure. Mild facet arthropathy and ligamentum flavum redundancy without canal stenosis. Mild LEFT neural foraminal narrowing.  L4-5: Minimal broad-based disc bulge with undersurface enhancing annular fissure. Mild facet arthropathy and ligamentum flavum redundancy without canal stenosis or neural foraminal narrowing.  L5-S1: Anterolisthesis. Annular bulging. Moderate to severe facet arthropathy without canal stenosis. Minimal neural foraminal narrowing.  IMPRESSION: Transitional anatomy. Minimal grade 1 L5-S1 anterolisthesis without spondylolysis.  Slightly progressed degenerative change of the lumbar spine. Multilevel enhancing annular fissures.  No canal stenosis. Minimal to mild L3-4 and L5-S1 neural foraminal narrowing.   Electronically Signed   By: Elon Alas M.D.   On: 01/20/2017 00:30  Assessment: 1. Suprapubic pain: Worse over the past month, excruciating when it occurs, feels like someone is "grabbing mild lower abdomen and squeezing all the blood out", also some dysuria, change in bowel habits, history of rectal polyp on  last colonoscopy in 2014 with recommendations for repeat in 5 years; question relation to spinal abnormalities versus proctitis versus colon spasm versus possible vascular condition-could consider CTA if colo unrevealing? 2. GERD: Controlled on homeopathic remedies per his wife 3. Diarrhea: New for the patient, intermittent over the past year 4. Change in bowel habits: See above  Plan: 1. Had discussion with the patient today that I would like to order a CT of his pelvis with contrast initially. He became adamant and serious that he wanted a colonoscopy as a first step in his evaluation. He told me that "I don't want to do a bunch of testing before we see whats really going on". I explained to him in detail that I'm unsure if it is his back or possibly prostatitis with his recent dysuria hx versus other which is giving him his pain at this time and a CT would allow [place] to make better recommendations, but he did not agree. 2. Scheduled patient for a colonoscopy in [place] with Dr. Loletha Carrow as he is supervising physician this afternoon. Did discuss risks, benefits, limitations and alternatives and the patient agrees to proceed. 3. Continue homeopathic remedies for reflux as this seems to be working for the patient 4. Patient to return to clinic per recommendations from Dr. Loletha Carrow after time of procedure  Ellouise Newer, PA-C [facility] 02/23/2017, 1:28 PM  Cc: Tonia Ghent, MD

## 2017-02-24 NOTE — Progress Notes (Signed)
Thank you for sending this case to me. I have reviewed the entire note, and the outlined plan seems appropriate.  If the colonoscopy on 8/3 is unrevealing, I will discuss a CTA abdomen/pelvis with him.  It does not sound like NOMI, but could be chronic colonic ischemia.  Wilfrid Lund, MD

## 2017-02-27 ENCOUNTER — Encounter: Payer: Self-pay | Admitting: Gastroenterology

## 2017-02-28 ENCOUNTER — Encounter: Payer: Self-pay | Admitting: Neurology

## 2017-02-28 ENCOUNTER — Ambulatory Visit (INDEPENDENT_AMBULATORY_CARE_PROVIDER_SITE_OTHER): Payer: PPO | Admitting: Neurology

## 2017-02-28 VITALS — BP 160/79 | HR 56 | Ht 66.75 in | Wt 164.0 lb

## 2017-02-28 DIAGNOSIS — M542 Cervicalgia: Secondary | ICD-10-CM | POA: Diagnosis not present

## 2017-02-28 DIAGNOSIS — R269 Unspecified abnormalities of gait and mobility: Secondary | ICD-10-CM | POA: Diagnosis not present

## 2017-02-28 NOTE — Progress Notes (Signed)
PATIENT: [name] [name] DOB: 1941-03-02  Chief Complaint  Patient presents with  . Back Pain    He is here with his wife, [name] [name].  Reports pain across his low back radiating into his bilateral legs.  His symptoms are causing him gait difficulty which have resulted in several falls. They would like to further review his recent lumbar MRI.   Marland Kitchen PCP    [name] Ghent, MD     HISTORICAL  [name] [name] is a 76 year old ambidextrous male, accompanied by his wife, seen in refer by  his primary care physician [name] [name], [name] [name] for evaluation of low back pain, gait abnormality. Initial evaluation was on February 28 2017.  He had a history of hypertension, hyperlipidemia, diabetes, coronary artery disease status post bypass surgery in 2015. 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  Since June 2018 also developed low back pain, radiating pain to both thigh, leg.  We have personally reviewed MRI of lumbar on January 20 2017: Multilevel mild degenerative disc disease, L5-S1 anterolisthesis, annular bulging, moderate to severe facet arthropathy, minimal neuroforaminal narrowing, no canal stenosis. He is taking gabapentin 600 mg 3 times a day.  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  REVIEW OF SYSTEMS: Full 14 system review of systems performed and notable only for fatigue, urination problems, weakness  ALLERGIES: Allergies  Allergen  Reactions  . Statins Other (See Comments)    Other Reaction: MYALGIA Other reaction(s): Other (See Comments) Other Reaction: MYALGIA Muscle pain    HOME MEDICATIONS: Current Outpatient Prescriptions  Medication Sig Dispense Refill  . acyclovir (ZOVIRAX) 800 MG tablet Take 1 tablet (800 mg total) by mouth 2 (two) times daily. 180 tablet 3  . aspirin EC 81 MG tablet Take 1 tablet (81 mg total) by mouth daily.    . diclofenac sodium (VOLTAREN) 1 % GEL Apply 4 g topically 4 (four) times daily as needed. 300 g prn  . erythromycin ophthalmic ointment Place 1 application into the left eye every 6 (six) hours. Place 1/2 inch ribbon of ointment in the affected eye 4 times a day 1 g 1  . fenofibrate 160 MG tablet Take 1 tablet (160 mg total) by mouth at bedtime. 90 tablet 3  . gabapentin (NEURONTIN) 600 MG tablet Take 1 tablet (600 mg total) by mouth 3 (three) times daily. 270 tablet 3  . latanoprost (XALATAN) 0.005 % ophthalmic solution Place 1 drop into the right eye at bedtime.    Marland Kitchen losartan (COZAAR) 50 MG tablet Take 1 tablet (50 mg total) by mouth daily. 90 tablet 3  . metFORMIN (GLUCOPHAGE) 850 MG tablet Take 1 tablet (850 mg total) by mouth 2 (two) times daily with a meal. 180 tablet 3  . metoprolol tartrate (LOPRESSOR) 25 MG tablet Take 1 tablet (25 mg total) by mouth 2 (two) times daily. 180 tablet 3  . Na Sulfate-K Sulfate-Mg Sulf 17.5-3.13-1.6 GM/180ML SOLN Take 1 kit by  mouth as directed. 354 mL 0  . nitroGLYCERIN (NITROSTAT) 0.4 MG SL tablet Place 1 tablet (0.4 mg total) under the tongue every 5 (five) minutes as needed for chest pain (max 3 doses). 25 tablet 6   No current facility-administered medications for this visit.     PAST MEDICAL HISTORY: Past Medical History:  Diagnosis Date  . Anginal pain ([place])   . Anxiety   . Back pain   . Chest pain   . 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 Diabetes mellitus   . Diabetic neuropathy ([place])   .  Diverticulosis   . Diverticulosis   . Erectile dysfunction   . Family history of anesthesia complication    ' they cant wake my brother very easy"  . Fatty liver   . Flank pain   . GERD (gastroesophageal reflux disease)   . Heart murmur   . History of bronchitis   . History of chicken pox   . 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 Hypercholesterolemia   . Hypertension   . Kidney stones   . Osteoarthritis     PAST SURGICAL HISTORY: Past Surgical History:  Procedure Laterality Date  . CARDIAC CATHETERIZATION  08/21/2013   DR [name] [name]  . CHOLECYSTECTOMY  1983  . CORONARY ARTERY BYPASS GRAFT N/A 08/23/2013   Procedure: CORONARY ARTERY BYPASS GRAFTING (CABG);  Surgeon: [name] Isaac, MD;  Location: [facility];  Service: Open Heart Surgery;  Laterality: N/A;  CABG times six utilizing the left internal mammary artery and the right greater saphenous vein harvested endoscopically  . ENDOVEIN HARVEST OF GREATER SAPHENOUS VEIN Right 08/23/2013   Procedure: ENDOVEIN HARVEST OF GREATER SAPHENOUS VEIN;  Surgeon: [name] Isaac, MD;  Location: [facility];  Service: Open Heart Surgery;  Laterality: Right;  . HERNIA REPAIR     X3  . INTRAOPERATIVE TRANSESOPHAGEAL ECHOCARDIOGRAM N/A 08/23/2013   Procedure: INTRAOPERATIVE TRANSESOPHAGEAL ECHOCARDIOGRAM;  Surgeon: [name] Isaac, MD;  Location: [facility];  Service: Open Heart Surgery;  Laterality: N/A;  . LEFT HEART CATHETERIZATION WITH CORONARY ANGIOGRAM N/A 08/21/2013   Procedure: LEFT HEART CATHETERIZATION WITH CORONARY ANGIOGRAM;  Surgeon: [name] Blanks, MD;  Location: [facility] CATH LAB;  Service: Cardiovascular;  Laterality: N/A;    FAMILY HISTORY: Family History  Problem Relation Age of Onset  . Stroke Mother   . Diabetes Mother   . Heart disease Father        heart failure  . Colon cancer Maternal Grandmother   . Parkinsonism Maternal Grandfather   . Stroke Paternal Grandfather   . Prostate cancer Maternal Uncle   . CAD  Brother        CABG    SOCIAL HISTORY:  Social History   Social History  . Marital status: Married    Spouse name: N/A  . Number of children: 2  . Years of education: HS   Occupational History  . Retired    Social History Main Topics  . Smoking status: Never Smoker  . Smokeless tobacco: Never Used  . Alcohol use No  . Drug use: No  . Sexual activity: Not on file   Other Topics Concern  . 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   Right-handed.   2 cups caffeine daily.  PHYSICAL EXAM   Vitals:   02/28/17 0803  BP: (!) 160/79  Pulse: (!) 56  Weight: 164 lb (74.4 kg)  Height: 5' 6.75" (1.695 m)    Not recorded      Body mass index is 25.88 kg/m.  PHYSICAL EXAMNIATION:  Gen: NAD, conversant, well nourised, obese, well groomed                     Cardiovascular: Regular rate rhythm, no peripheral edema, warm, nontender. Eyes: Conjunctivae clear without exudates or hemorrhage Neck: Supple, no carotid bruits. Pulmonary: Clear to auscultation bilaterally   NEUROLOGICAL EXAM:  MENTAL STATUS: Speech:    Speech is normal; fluent and spontaneous with normal comprehension.  Cognition:     Orientation to time, place and person     Normal recent and remote memory     Normal Attention span and concentration     Normal Language, naming, repeating,spontaneous speech     Fund of knowledge   CRANIAL NERVES: CN II: Visual fields are full to confrontation. Fundoscopic exam is normal with sharp discs and no vascular changes. Pupils are round equal and briskly reactive to light. CN III, IV, VI: extraocular movement are normal. No ptosis. CN V: Facial sensation is intact to pinprick in all 3 divisions bilaterally. Corneal responses are intact.  CN VII: Face is symmetric with normal eye closure and smile. CN VIII: Hearing is  normal to rubbing fingers CN IX, X: Palate elevates symmetrically. Phonation is normal. CN XI: Head turning and shoulder shrug are intact CN XII: Tongue is midline with normal movements and no atrophy.  MOTOR: There is no pronator drift of out-stretched arms. Muscle bulk and tone are normal. Muscle strength is normal.  REFLEXES: Reflexes are 3 and symmetric at the biceps, triceps, knees, and ankles. Plantar responses are extensor  SENSORY: Intact to light touch, pinprick, positional sensation and vibratory sensation are intact in fingers and toes.  COORDINATION: Rapid alternating movements and fine finger movements are intact. There is no dysmetria on finger-to-nose and heel-knee-shin.    GAIT/STANCE: He needs to push him to get up from seated position, mildly stiff, unsteady gait  DIAGNOSTIC DATA (LABS, IMAGING, TESTING) - I reviewed patient records, labs, notes, testing and imaging myself where available.   ASSESSMENT AND PLAN  WALLICE GRANVILLE is a 76 y.o. male   Chronic neck pain, stiff gait, hyperreflexia bilateral Babinski signs  Need to rule out cervical spondylitic myelopathy  Proceed with MRI of cervical spine   Marcial Pacas, M.D. Ph.D.  [facility] [address], [place], [place] [address] Ph: [phone] Fax: [phone]  VA:NVBTYO, [name] Rising, MD

## 2017-03-10 ENCOUNTER — Ambulatory Visit (AMBULATORY_SURGERY_CENTER): Payer: PPO | Admitting: Gastroenterology

## 2017-03-10 ENCOUNTER — Encounter: Payer: Self-pay | Admitting: Gastroenterology

## 2017-03-10 VITALS — BP 133/80 | HR 50 | Temp 97.5°F | Resp 11 | Ht 66.0 in | Wt 164.0 lb

## 2017-03-10 DIAGNOSIS — R194 Change in bowel habit: Secondary | ICD-10-CM | POA: Diagnosis not present

## 2017-03-10 DIAGNOSIS — I10 Essential (primary) hypertension: Secondary | ICD-10-CM | POA: Diagnosis not present

## 2017-03-10 DIAGNOSIS — I4891 Unspecified atrial fibrillation: Secondary | ICD-10-CM | POA: Diagnosis not present

## 2017-03-10 DIAGNOSIS — D120 Benign neoplasm of cecum: Secondary | ICD-10-CM

## 2017-03-10 DIAGNOSIS — I2510 Atherosclerotic heart disease of native coronary artery without angina pectoris: Secondary | ICD-10-CM | POA: Diagnosis not present

## 2017-03-10 DIAGNOSIS — R1032 Left lower quadrant pain: Secondary | ICD-10-CM | POA: Diagnosis not present

## 2017-03-10 DIAGNOSIS — E119 Type 2 diabetes mellitus without complications: Secondary | ICD-10-CM | POA: Diagnosis not present

## 2017-03-10 DIAGNOSIS — R109 Unspecified abdominal pain: Secondary | ICD-10-CM | POA: Diagnosis not present

## 2017-03-10 DIAGNOSIS — K5730 Diverticulosis of large intestine without perforation or abscess without bleeding: Secondary | ICD-10-CM | POA: Diagnosis not present

## 2017-03-10 DIAGNOSIS — K635 Polyp of colon: Secondary | ICD-10-CM | POA: Diagnosis not present

## 2017-03-10 MED ORDER — SODIUM CHLORIDE 0.9 % IV SOLN: 500.0000 mL | INTRAVENOUS | Status: DC

## 2017-03-10 NOTE — Patient Instructions (Addendum)
Impression/Recommendations:  Polyp handout given to patient. Diverticulosis handout given to patient. Hemorrhoid handout given to patient.  No aspirin, ibuprofen, naproxen, or other NSAID drugs for 7 days.  Tylenol only until 03/18/2017.  Resume previous diet.  Stool softener 100 mg. Twice daily.  Docusate.  Available over the counter.  YOU HAD AN ENDOSCOPIC PROCEDURE TODAY AT [facility]:   Refer to the procedure report that was given to you for any specific questions about what was found during the examination.  If the procedure report does not answer your questions, please call your gastroenterologist to clarify.  If you requested that your care partner not be given the details of your procedure findings, then the procedure report has been included in a sealed envelope for you to review at your convenience later.  YOU SHOULD EXPECT: Some feelings of bloating in the abdomen. Passage of more gas than usual.  Walking can help get rid of the air that was put into your GI tract during the procedure and reduce the bloating. If you had a lower endoscopy (such as a colonoscopy or flexible sigmoidoscopy) you may notice spotting of blood in your stool or on the toilet paper. If you underwent a bowel prep for your procedure, you may not have a normal bowel movement for a few days.  Please Note:  You might notice some irritation and congestion in your nose or some drainage.  This is from the oxygen used during your procedure.  There is no need for concern and it should clear up in a day or so.  SYMPTOMS TO REPORT IMMEDIATELY:   Following lower endoscopy (colonoscopy or flexible sigmoidoscopy):  Excessive amounts of blood in the stool  Significant tenderness or worsening of abdominal pains  Swelling of the abdomen that is new, acute  Fever of 100F or higher For urgent or emergent issues, a gastroenterologist can be reached at any hour by calling [phone].   DIET:  We do  recommend a small meal at first, but then you may proceed to your regular diet.  Drink plenty of fluids but you should avoid alcoholic beverages for 24 hours.  ACTIVITY:  You should plan to take it easy for the rest of today and you should NOT DRIVE or use heavy machinery until tomorrow (because of the sedation medicines used during the test).    FOLLOW UP: Our staff will call the number listed on your records the next business day following your procedure to check on you and address any questions or concerns that you may have regarding the information given to you following your procedure. If we do not reach you, we will leave a message.  However, if you are feeling well and you are not experiencing any problems, there is no need to return our call.  We will assume that you have returned to your regular daily activities without incident.  If any biopsies were taken you will be contacted by phone or by letter within the next 1-3 weeks.  Please call us at [phone] if you have not heard about the biopsies in 3 weeks.    SIGNATURES/CONFIDENTIALITY: You and/or your care partner have signed paperwork which will be entered into your electronic medical record.  These signatures attest to the fact that that the information above on your After Visit Summary has been reviewed and is understood.  Full responsibility of the confidentiality of this discharge information lies with you and/or your care-partner.YOU HAD AN ENDOSCOPIC PROCEDURE TODAY  AT [facility]:   Refer to the procedure report that was given to you for any specific questions about what was found during the examination.  If the procedure report does not answer your questions, please call your gastroenterologist to clarify.  If you requested that your care partner not be given the details of your procedure findings, then the procedure report has been included in a sealed envelope for you to review at your convenience later.  YOU  SHOULD EXPECT: Some feelings of bloating in the abdomen. Passage of more gas than usual.  Walking can help get rid of the air that was put into your GI tract during the procedure and reduce the bloating. If you had a lower endoscopy (such as a colonoscopy or flexible sigmoidoscopy) you may notice spotting of blood in your stool or on the toilet paper. If you underwent a bowel prep for your procedure, you may not have a normal bowel movement for a few days.  Please Note:  You might notice some irritation and congestion in your nose or some drainage.  This is from the oxygen used during your procedure.  There is no need for concern and it should clear up in a day or so.  SYMPTOMS TO REPORT IMMEDIATELY:   Following lower endoscopy (colonoscopy or flexible sigmoidoscopy):  Excessive amounts of blood in the stool  Significant tenderness or worsening of abdominal pains  Swelling of the abdomen that is new, acute  Fever of 100F or higher For urgent or emergent issues, a gastroenterologist can be reached at any hour by calling [phone].   DIET:  We do recommend a small meal at first, but then you may proceed to your regular diet.  Drink plenty of fluids but you should avoid alcoholic beverages for 24 hours.  ACTIVITY:  You should plan to take it easy for the rest of today and you should NOT DRIVE or use heavy machinery until tomorrow (because of the sedation medicines used during the test).    FOLLOW UP: Our staff will call the number listed on your records the next business day following your procedure to check on you and address any questions or concerns that you may have regarding the information given to you following your procedure. If we do not reach you, we will leave a message.  However, if you are feeling well and you are not experiencing any problems, there is no need to return our call.  We will assume that you have returned to your regular daily activities without incident.  If any  biopsies were taken you will be contacted by phone or by letter within the next 1-3 weeks.  Please call us at [phone] if you have not heard about the biopsies in 3 weeks.    SIGNATURES/CONFIDENTIALITY: You and/or your care partner have signed paperwork which will be entered into your electronic medical record.  These signatures attest to the fact that that the information above on your After Visit Summary has been reviewed and is understood.  Full responsibility of the confidentiality of this discharge information lies with you and/or your care-partner.ftener 100 mg. Twice daily.   Docusate.   Available over the counter.

## 2017-03-10 NOTE — Progress Notes (Signed)
Called to room to assist during endoscopic procedure.  Patient ID and intended procedure confirmed with present staff. Received instructions for my participation in the procedure from the performing physician.  

## 2017-03-10 NOTE — Progress Notes (Signed)
Pt's states no medical or surgical changes since previsit or office visit. 

## 2017-03-10 NOTE — Op Note (Signed)
[facility] Patient Name: [name] [name] Procedure Date: 03/10/2017 7:56 AM MRN: [id number] Endoscopist: [place]. [name] [name] , MD Age: 76 Referring MD:  Date of Birth: October 13, 1940 Gender: Male Account #: [id number] Procedure:                Colonoscopy Indications:              Abdominal pain in the left lower quadrant Medicines:                Monitored Anesthesia Care Procedure:                Pre-Anesthesia Assessment:                           - Prior to the procedure, a History and Physical                            was performed, and patient medications and                            allergies were reviewed. The patient's tolerance of                            previous anesthesia was also reviewed. The risks                            and benefits of the procedure and the sedation                            options and risks were discussed with the patient.                            All questions were answered, and informed consent                            was obtained. Anticoagulants: The patient has taken                            aspirin. It was decided not to withhold this                            medication prior to the procedure. ASA Grade                            Assessment: III - A patient with severe systemic                            disease. After reviewing the risks and benefits,                            the patient was deemed in satisfactory condition to                            undergo the procedure.  After obtaining informed consent, the colonoscope                            was passed under direct vision. Throughout the                            procedure, the patient's blood pressure, pulse, and                            oxygen saturations were monitored continuously. The                            Colonoscope was introduced through the anus and                            advanced to the the cecum, identified by                      appendiceal orifice and ileocecal valve. The                            colonoscopy was performed without difficulty. The                            patient tolerated the procedure well. The quality                            of the bowel preparation was excellent. The                            ileocecal valve, appendiceal orifice, and rectum                            were photographed. The quality of the bowel                            preparation was evaluated using the BBPS [facility]                            Bowel Preparation Scale) with scores of: Right                            Colon = 3, Transverse Colon = 3 and Left Colon = 2.                            The total BBPS score equals 8. The bowel                            preparation used was SUPREP. Scope In: 8:06:00 AM Scope Out: 8:17:48 AM Scope Withdrawal Time: 0 hours 9 minutes 3 seconds  Total Procedure Duration: 0 hours 11 minutes 48 seconds  Findings:                 The perianal and digital rectal examinations were  normal.                           A 10 mm polyp was found in the cecum. The polyp was                            multi-lobulated and sessile. The polyp was removed                            with a piecemeal technique using a hot snare. The                            polyp was removed with a cold snare. Resection and                            retrieval were complete.                           Many large-mouthed diverticula were found in the                            entire colon. The sigmoid colon was particularly                            affected, with associated tortuosity.                           Internal hemorrhoids were found. The hemorrhoids                            were Grade I (internal hemorrhoids that do not                            prolapse).                           The exam was otherwise without abnormality on                             direct and retroflexion views. Complications:            No immediate complications. Estimated Blood Loss:     Estimated blood loss was minimal. Impression:               - One 10 mm polyp in the cecum, removed with a cold                            snare and removed piecemeal using a hot snare.                            Resected and retrieved.                           - Diverticulosis in the entire examined colon.                           -  Internal hemorrhoids.                           - The examination was otherwise normal on direct                            and retroflexion views.                           Pain described as worse during periods of                            constipation. Most likely from incomplete                            evacuation and diveticular spasm. Recommendation:           - Patient has a contact number available for                            emergencies. The signs and symptoms of potential                            delayed complications were discussed with the                            patient. Return to normal activities tomorrow.                            Written discharge instructions were provided to the                            patient.                           - Resume previous diet.                           - No aspirin, ibuprofen, naproxen, or other                            non-steroidal anti-inflammatory drugs for 7 days                            after polyp removal.                           - Await pathology results.                           - No repeat colonoscopy due to age.                           - Stool softener (Ducosate) 100 mg twice daily -                            available over the counter. [name] L.  [name] Carrow, MD 03/10/2017 8:25:27 AM This report has been signed electronically.

## 2017-03-13 ENCOUNTER — Ambulatory Visit
Admission: RE | Admit: 2017-03-13 | Discharge: 2017-03-13 | Disposition: A | Discharge status: Home / Self Care | Payer: PPO | Source: Ambulatory Visit | Attending: Neurology | Admitting: Neurology

## 2017-03-13 ENCOUNTER — Telehealth: Payer: Self-pay | Admitting: *Deleted

## 2017-03-13 DIAGNOSIS — M5022 Other cervical disc displacement, mid-cervical region, unspecified level: Secondary | ICD-10-CM | POA: Diagnosis not present

## 2017-03-13 DIAGNOSIS — M50221 Other cervical disc displacement at C4-C5 level: Secondary | ICD-10-CM | POA: Diagnosis not present

## 2017-03-13 DIAGNOSIS — R269 Unspecified abnormalities of gait and mobility: Secondary | ICD-10-CM

## 2017-03-13 DIAGNOSIS — M542 Cervicalgia: Secondary | ICD-10-CM

## 2017-03-13 NOTE — Telephone Encounter (Signed)
  Follow up Call-  Call back number 03/10/2017  Post procedure Call Back phone  # [phone]  Permission to leave phone message Yes  Some recent data might be hidden     Patient questions:  Do you have a fever, pain , or abdominal swelling? No. Pain Score  0 *  Have you tolerated food without any problems? Yes.    Have you been able to return to your normal activities? Yes.    Do you have any questions about your discharge instructions: Diet   No. Medications  No. Follow up visit  No.  Do you have questions or concerns about your Care? No.  Actions: * If pain score is 4 or above: No action needed, pain <4.

## 2017-03-14 ENCOUNTER — Encounter: Payer: Self-pay | Admitting: Gastroenterology

## 2017-03-22 ENCOUNTER — Encounter: Payer: Self-pay | Admitting: Family Medicine

## 2017-03-22 ENCOUNTER — Ambulatory Visit (INDEPENDENT_AMBULATORY_CARE_PROVIDER_SITE_OTHER): Payer: PPO | Admitting: Family Medicine

## 2017-03-22 VITALS — BP 150/70 | HR 56 | Temp 97.7°F | Wt 164.8 lb

## 2017-03-22 DIAGNOSIS — E785 Hyperlipidemia, unspecified: Secondary | ICD-10-CM

## 2017-03-22 DIAGNOSIS — G729 Myopathy, unspecified: Secondary | ICD-10-CM | POA: Diagnosis not present

## 2017-03-22 NOTE — Progress Notes (Signed)
Follow up.  Is back to baseline and the only changes he made was cutting out fenofibrate.  His gait and leg changes are totally back to normal, "99% normal".  He tried to restart the med and had return of sx.  He had statin intolerance prev.  Had been on fenofibrate for ~22 years before he had trouble on fenofibrate, per his report.  D/w pt.  I haven't seen this reaction with this med prev, d/w pt.   He has been off med for about 2 weeks, totally.    His lower abd pain resolved off the fenofibrate.    Meds, vitals, and allergies reviewed.   ROS: Per HPI unless specifically indicated in ROS section   GEN: nad, alert and oriented HEENT: mucous membranes moist NECK: supple w/o LA CV: rrr PULM: ctab, no inc wob ABD: soft, +bs EXT: no edema

## 2017-03-22 NOTE — Patient Instructions (Signed)
Fasting labs tomorrow AM.  Stop at the lab on the way out to get that scheduled.  I agree with you about the fenofibrate.  Stay off it.   Take care.  Glad to see you.  We need to see about options after I see your labs.

## 2017-03-23 ENCOUNTER — Other Ambulatory Visit (INDEPENDENT_AMBULATORY_CARE_PROVIDER_SITE_OTHER): Payer: PPO

## 2017-03-23 DIAGNOSIS — G729 Myopathy, unspecified: Secondary | ICD-10-CM | POA: Diagnosis not present

## 2017-03-23 DIAGNOSIS — E785 Hyperlipidemia, unspecified: Secondary | ICD-10-CM | POA: Diagnosis not present

## 2017-03-23 LAB — COMPREHENSIVE METABOLIC PANEL
ALBUMIN: 4.2 g/dL (ref 3.5–5.2)
ALK PHOS: 51 U/L (ref 39–117)
ALT: 16 U/L (ref 0–53)
AST: 16 U/L (ref 0–37)
BUN: 16 mg/dL (ref 6–23)
CO2: 29 mEq/L (ref 19–32)
CREATININE: 0.91 mg/dL (ref 0.40–1.50)
Calcium: 9.7 mg/dL (ref 8.4–10.5)
Chloride: 104 mEq/L (ref 96–112)
GFR: 85.92 mL/min (ref >60.00)
Glucose, Bld: 156 mg/dL — ABNORMAL HIGH (ref 70–99)
Potassium: 5 mEq/L (ref 3.5–5.1)
SODIUM: 137 meq/L (ref 135–145)
TOTAL PROTEIN: 6.5 g/dL (ref 6.0–8.3)
Total Bilirubin: 0.6 mg/dL (ref 0.2–1.2)

## 2017-03-23 LAB — CBC WITH DIFFERENTIAL/PLATELET
BASOS PCT: 0.5 % (ref 0.0–3.0)
Basophils Absolute: 0 10*3/uL (ref 0.0–0.1)
EOS ABS: 0.1 10*3/uL (ref 0.0–0.7)
Eosinophils Relative: 1.8 % (ref 0.0–5.0)
HCT: 43.9 % (ref 39.0–52.0)
HEMOGLOBIN: 14.7 g/dL (ref 13.0–17.0)
Lymphocytes Relative: 37.2 % (ref 12.0–46.0)
Lymphs Abs: 2.3 10*3/uL (ref 0.7–4.0)
MCHC: 33.4 g/dL (ref 30.0–36.0)
MCV: 95.2 fl (ref 78.0–100.0)
MONO ABS: 0.4 10*3/uL (ref 0.1–1.0)
Monocytes Relative: 6.9 % (ref 3.0–12.0)
Neutro Abs: 3.4 10*3/uL (ref 1.4–7.7)
Neutrophils Relative %: 53.6 % (ref 43.0–77.0)
Platelets: 187 10*3/uL (ref 150.0–400.0)
RBC: 4.61 Mil/uL (ref 4.22–5.81)
RDW: 13.7 % (ref 11.5–15.5)
WBC: 6.3 10*3/uL (ref 4.0–10.5)

## 2017-03-23 LAB — LIPID PANEL
CHOL/HDL RATIO: 4
Cholesterol: 122 mg/dL (ref 0–200)
HDL: 29.1 mg/dL — ABNORMAL LOW (ref >39.00)
NONHDL: 93.22
TRIGLYCERIDES: 226 mg/dL — AB (ref 0.0–149.0)
VLDL: 45.2 mg/dL — ABNORMAL HIGH (ref 0.0–40.0)

## 2017-03-23 LAB — CK: Total CK: 38 U/L (ref 7–232)

## 2017-03-23 LAB — LDL CHOLESTEROL, DIRECT: LDL DIRECT: 72 mg/dL

## 2017-03-23 NOTE — Assessment & Plan Note (Signed)
It appears that all of his symptoms were due to fenofibrate. He stopped the medication and he got better. He tried to restart and all of the symptoms returned. He stopped the medication again and all of the symptoms resolved. He clearly feels better in the meantime. It is atypical for this to happen, since he had been on the medicine for about 22 years before he had trouble. I thanked the patient for his input, I was not expecting any of his symptoms to be medicine related. I am grateful for the patient's effort. We will recheck his labs tomorrow and then we will try to make plans about his lipids at that point, without fenofibrate or statin. He agrees.

## 2017-03-27 ENCOUNTER — Other Ambulatory Visit: Payer: Self-pay | Admitting: Family Medicine

## 2017-03-27 DIAGNOSIS — E7800 Pure hypercholesterolemia, unspecified: Secondary | ICD-10-CM

## 2017-03-30 ENCOUNTER — Encounter: Payer: Self-pay | Admitting: Neurology

## 2017-03-30 ENCOUNTER — Ambulatory Visit (INDEPENDENT_AMBULATORY_CARE_PROVIDER_SITE_OTHER): Payer: PPO | Admitting: Neurology

## 2017-03-30 VITALS — BP 150/86 | HR 56 | Ht 66.0 in | Wt 165.0 lb

## 2017-03-30 DIAGNOSIS — Z951 Presence of aortocoronary bypass graft: Secondary | ICD-10-CM | POA: Diagnosis not present

## 2017-03-30 DIAGNOSIS — E1149 Type 2 diabetes mellitus with other diabetic neurological complication: Secondary | ICD-10-CM | POA: Diagnosis not present

## 2017-03-30 DIAGNOSIS — I480 Paroxysmal atrial fibrillation: Secondary | ICD-10-CM

## 2017-03-30 DIAGNOSIS — R269 Unspecified abnormalities of gait and mobility: Secondary | ICD-10-CM | POA: Diagnosis not present

## 2017-03-30 NOTE — Progress Notes (Signed)
PATIENT: [name] [name] DOB: Jan 25, 1941  Chief Complaint  Patient presents with  . Neck Pain    He is here with his wife, [name], to discuss the results of his MRI.     HISTORICAL  [name] [name] is a 76 year old ambidextrous male, accompanied by his wife, seen in refer by  his primary care physician Dr.Duncan, Elveria Rising for evaluation of low back pain, gait abnormality. Initial evaluation was on February 28 2017.  He had a history of hypertension, hyperlipidemia, diabetes, coronary artery disease status post bypass surgery in 2015. 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  Since June 2018 also developed low back pain, radiating pain to both thigh, leg.  We have personally reviewed MRI of lumbar on January 20 2017: Multilevel mild degenerative disc disease, L5-S1 anterolisthesis, annular bulging, moderate to severe facet arthropathy, minimal neuroforaminal narrowing, no canal stenosis. He is taking gabapentin 600 mg 3 times a day.  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  UPDATE March 30 2017:  We have personally reviewed MRI of cervical spine, mild multilevel degenerative changes, but there was no cord compression or nerve root compression,  Compared to Dec 26 2016 when he has set onset falling, gait abnormality, he had a moderate improvement, mild bilateral feet numbness, history of diabetes  REVIEW OF SYSTEMS: Full 14  system review of systems performed and notable only for fatigue, urination problems, weakness  ALLERGIES: Allergies  Allergen Reactions  . Fenofibrate Other (See Comments)    Muscle weakness.    . Statins Other (See Comments)    Other Reaction: MYALGIA Other reaction(s): Other (See Comments) Other Reaction: MYALGIA Muscle pain    HOME MEDICATIONS: Current Outpatient Prescriptions  Medication Sig Dispense Refill  . acyclovir (ZOVIRAX) 800 MG tablet Take 1 tablet (800 mg total) by mouth 2 (two) times daily. 180 tablet 3  . aspirin EC 81 MG tablet Take 1 tablet (81 mg total) by mouth daily.    . diclofenac sodium (VOLTAREN) 1 % GEL Apply 4 g topically 4 (four) times daily as needed. 300 g prn  . gabapentin (NEURONTIN) 600 MG tablet Take 1 tablet (600 mg total) by mouth 3 (three) times daily. 270 tablet 3  . latanoprost (XALATAN) 0.005 % ophthalmic solution Place 1 drop into the right eye at bedtime.    Marland Kitchen losartan (COZAAR) 50 MG tablet Take 1 tablet (50 mg total) by mouth daily. 90 tablet 3  . metFORMIN (GLUCOPHAGE) 850 MG tablet Take 1 tablet (850 mg total) by mouth 2 (two) times daily with a meal. 180 tablet 3  . metoprolol tartrate (LOPRESSOR) 25 MG tablet Take 1 tablet (25 mg total) by mouth 2 (two) times daily. 180 tablet 3  . nitroGLYCERIN (NITROSTAT) 0.4 MG SL tablet Place 1 tablet (0.4 mg total) under the tongue every 5 (five) minutes as needed for chest pain (max 3 doses). 25 tablet 6   Current Facility-Administered Medications  Medication Dose Route Frequency Provider Last Rate Last Dose  . 0.9 %  sodium chloride infusion  500 mL Intravenous Continuous Doran Stabler, MD        PAST MEDICAL HISTORY: Past Medical History:  Diagnosis Date  . Anginal pain ([place])   . Anxiety   . Back pain   . Chest pain   . 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 Diabetes mellitus   . Diabetic neuropathy ([place])   . Diverticulosis   . Diverticulosis   . Erectile  dysfunction   . Family history of anesthesia complication    ' they cant wake my brother very easy"  . Fatty liver   . Flank pain   . GERD (gastroesophageal reflux disease)   . Heart murmur   . History of bronchitis   . History of chicken pox   . 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 Hypercholesterolemia   . Hypertension   . Kidney stones   . Osteoarthritis     PAST SURGICAL HISTORY: Past Surgical History:  Procedure Laterality Date  . CARDIAC CATHETERIZATION  08/21/2013   DR Angelena Form  . CHOLECYSTECTOMY  1983  . CORONARY ARTERY BYPASS GRAFT N/A 08/23/2013   Procedure: CORONARY ARTERY BYPASS GRAFTING (CABG);  Surgeon: Grace Isaac, MD;  Location: [place];  Service: Open Heart Surgery;  Laterality: N/A;  CABG times six utilizing the left internal mammary artery and the right greater saphenous vein harvested endoscopically  . ENDOVEIN HARVEST OF GREATER SAPHENOUS VEIN Right 08/23/2013   Procedure: ENDOVEIN HARVEST OF GREATER SAPHENOUS VEIN;  Surgeon: Grace Isaac, MD;  Location: [place];  Service: Open Heart Surgery;  Laterality: Right;  . HERNIA REPAIR     X3  . INTRAOPERATIVE TRANSESOPHAGEAL ECHOCARDIOGRAM N/A 08/23/2013   Procedure: INTRAOPERATIVE TRANSESOPHAGEAL ECHOCARDIOGRAM;  Surgeon: Grace Isaac, MD;  Location: [place] [facility];  Service: Open Heart Surgery;  Laterality: N/A;  . LEFT HEART CATHETERIZATION WITH CORONARY ANGIOGRAM N/A 08/21/2013   Procedure: LEFT HEART CATHETERIZATION WITH CORONARY ANGIOGRAM;  Surgeon: Burnell Blanks, MD;  Location: [facility] CATH LAB;  Service: Cardiovascular;  Laterality: N/A;    FAMILY HISTORY: Family History  Problem Relation Age of Onset  . Stroke Mother   . Diabetes Mother   . Heart disease Father        heart failure  . Colon cancer Maternal Grandmother   . Parkinsonism Maternal Grandfather   . Stroke Paternal Grandfather   . Prostate cancer Maternal Uncle   . CAD Brother        CABG    SOCIAL  HISTORY:  Social History   Social History  . Marital status: Married    Spouse name: N/A  . Number of children: 2  . Years of education: HS   Occupational History  . Retired    Social History Main Topics  . Smoking status: Never Smoker  . Smokeless tobacco: Never Used  . Alcohol use No  . Drug use: No  . Sexual activity: Not on file   Other Topics Concern  . Not on file   Social History Narrative   Retired, married 1988   From 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   Right-handed.   2 cups caffeine daily.     PHYSICAL EXAM   Vitals:   03/30/17 0830  BP: (!) 150/86  Pulse: (!) 56  Weight: 165 lb (74.8 kg)  Height: 5\' 6"  (1.676 m)    Not recorded      Body mass index is 26.63 kg/m.  PHYSICAL EXAMNIATION:  Gen: NAD, conversant, well nourised, obese, well groomed                     Cardiovascular: Regular rate rhythm, no peripheral edema, warm, nontender. Eyes: Conjunctivae clear without exudates or hemorrhage Neck: Supple, no carotid bruits. Pulmonary: Clear to auscultation bilaterally   NEUROLOGICAL EXAM:  MENTAL STATUS: Speech:    Speech is normal; fluent and spontaneous with normal comprehension.  Cognition:     Orientation to time, place and person     Normal recent and remote memory     Normal Attention span and concentration     Normal Language, naming, repeating,spontaneous speech     Fund of knowledge   CRANIAL NERVES: CN II: Visual fields are full to confrontation. Fundoscopic exam is normal with sharp discs and no vascular changes. Pupils are round equal and briskly reactive to light. CN III, IV, VI: extraocular movement are normal. No ptosis. CN V: Facial sensation is intact to pinprick in all 3 divisions bilaterally. Corneal responses are intact.  CN VII: Face is symmetric with normal eye closure and smile. CN VIII: Hearing is normal to rubbing fingers CN IX, X:  Palate elevates symmetrically. Phonation is normal. CN XI: Head turning and shoulder shrug are intact CN XII: Tongue is midline with normal movements and no atrophy.  MOTOR: There is no pronator drift of out-stretched arms. Muscle bulk and tone are normal. Muscle strength is normal.  REFLEXES: Reflexes are 3 and symmetric at the biceps, triceps, knees, and ankles. Plantar responses are extensor  SENSORY: Intact to light touch, pinprick, positional sensation and vibratory sensation are intact in fingers and toes.  COORDINATION: He has mild trunk ataxia, tends to lean towards the left side, mild left heel-to-shin dysmetria,  GAIT/STANCE: He is able to arm cross, getting up from seated position without difficulty, mild difficulty turning  DIAGNOSTIC DATA (LABS, IMAGING, TESTING) - I reviewed patient records, labs, notes, testing and imaging myself where available.   ASSESSMENT AND PLAN  LENNIE VASCO is a 76 y.o. male   Sudden onset gait abnormality, falls since Dec 26 2016, continued evidence of mild trunk ataxia leaning towards the left side  Differentiation diagnosis including brainstem/cerebellar stroke,  MRI of brain, also complained of low back pain, hyperreflexia, bilateral Babinski signs on examination, worsening urination problem   MRI of thoracic spine  Continue well hydration, moderate exercise, aspirin daily  Marcial Pacas, M.D. Ph.D.  [facility] [address], [place], [place] [address] Ph: [phone] Fax: [phone]  [email or web address], Elveria Rising, MD

## 2017-04-08 ENCOUNTER — Telehealth: Payer: Self-pay | Admitting: Neurology

## 2017-04-08 ENCOUNTER — Ambulatory Visit
Admission: RE | Admit: 2017-04-08 | Discharge: 2017-04-08 | Disposition: A | Discharge status: Home / Self Care | Payer: PPO | Source: Ambulatory Visit | Attending: Neurology | Admitting: Neurology

## 2017-04-08 DIAGNOSIS — E1149 Type 2 diabetes mellitus with other diabetic neurological complication: Secondary | ICD-10-CM

## 2017-04-08 DIAGNOSIS — I480 Paroxysmal atrial fibrillation: Secondary | ICD-10-CM

## 2017-04-08 DIAGNOSIS — R269 Unspecified abnormalities of gait and mobility: Secondary | ICD-10-CM | POA: Diagnosis not present

## 2017-04-08 DIAGNOSIS — M5124 Other intervertebral disc displacement, thoracic region: Secondary | ICD-10-CM | POA: Diagnosis not present

## 2017-04-08 DIAGNOSIS — Z951 Presence of aortocoronary bypass graft: Secondary | ICD-10-CM

## 2017-04-08 DIAGNOSIS — R531 Weakness: Secondary | ICD-10-CM | POA: Diagnosis not present

## 2017-04-08 NOTE — Telephone Encounter (Signed)
MRI images this evening returned with evidence of subacute bilateral embolic infarcts. Patient has a hx of afib and he in on asa 81mg . Discussed options with patient and wife: Could go to the emergency room and be admitted to complete stroke workup which is the quickest and safest option. Alternatively I suggested starting Eliquis this weekend with outpatient workup and follow up with Dr Krista Blue. Patient and wife prefer not to go to ED or start anticoagulation, they want to discuss with Dr. Krista Blue next week if he could be squeezed in for an appointment. At this time he does not want to go to the ED or start Eliquis. I explained he is at high risk for stroke and if he has any symptoms to call 911 and proceed to ED. Asked him to at least increase ASA to 325 until he can be seen by Dr. Krista Blue. He will need complete stroke evaluation for other causes of embolic stroke, of note recent echo did show a PFO. He does also report episodes of heart palpitations and a recent heart monitor that showed his heart "speeding up" but he is unclear if it was afib or not. He has followed with cardiologist Dr. Angelena Form.

## 2017-04-11 ENCOUNTER — Ambulatory Visit (INDEPENDENT_AMBULATORY_CARE_PROVIDER_SITE_OTHER): Payer: PPO | Admitting: Neurology

## 2017-04-11 ENCOUNTER — Encounter: Payer: Self-pay | Admitting: Neurology

## 2017-04-11 VITALS — BP 186/76 | HR 52 | Ht 66.0 in | Wt 165.2 lb

## 2017-04-11 DIAGNOSIS — I480 Paroxysmal atrial fibrillation: Secondary | ICD-10-CM | POA: Diagnosis not present

## 2017-04-11 DIAGNOSIS — I63429 Cerebral infarction due to embolism of unspecified anterior cerebral artery: Secondary | ICD-10-CM | POA: Diagnosis not present

## 2017-04-11 DIAGNOSIS — I639 Cerebral infarction, unspecified: Secondary | ICD-10-CM | POA: Insufficient documentation

## 2017-04-11 DIAGNOSIS — R269 Unspecified abnormalities of gait and mobility: Secondary | ICD-10-CM | POA: Diagnosis not present

## 2017-04-11 MED ORDER — APIXABAN 5 MG PO TABS: 5.0000 mg | ORAL_TABLET | Freq: Two times a day (BID) | ORAL | 11 refills | Status: DC

## 2017-04-11 NOTE — Progress Notes (Signed)
PATIENT: [name] [name] DOB: May 16, 1941  Chief Complaint  Patient presents with  . Cerebrovascular Accident    He is here with his wife, [name].  They would like to further review his recent abnormal brain MRI.  He has started taking aspirin 325mg  daily.      HISTORICAL  [name] [name] is a 76 year old ambidextrous male, accompanied by his wife, seen in refer by  his primary care physician Dr.Duncan, Elveria Rising for evaluation of low back pain, gait abnormality. Initial evaluation was on February 28 2017.  He had a history of hypertension, hyperlipidemia, diabetes, coronary artery disease status post bypass surgery in 2015. 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  He also reported second episode few weeks later, again similar episode set onset mild confusion, loss control of his posturing, unsteady gait,  Since June 2018 also developed low back pain, radiating pain to both thigh, leg.  We have personally reviewed MRI of lumbar on January 20 2017: Multilevel mild degenerative disc disease, L5-S1 anterolisthesis, annular bulging, moderate to severe facet arthropathy, minimal neuroforaminal narrowing, no canal stenosis. He is taking gabapentin 600 mg 3 times a day.  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  UPDATE March 30 2017:  We have personally reviewed MRI of cervical spine, mild multilevel degenerative changes, but there was  no cord compression or nerve root compression,  Compared to Dec 26 2016 when he has sudden onset falling, gait abnormality, he had a moderate improvement, mild bilateral feet numbness, history of diabetes  UPDATE Sept 4 2018: We have personally reviewed MRI of the thoracic spine in August 2018 that was normal,  MRI of the brain showed small subacute white matter lacunar infarction in bilateral centrum semiovale, possible petechial hemorrhage on the right side, no mass effect,  MRI of cervical spine showed multilevel degenerative changes, there is no significant canal stenosis, no evidence of nerve root compression  MRI of lumbar showed mild degenerative changesin the canal stenosis or foraminal stenosis  ECHO Cardiogram on February 16 2017 showed ejection fraction 55-60%, wall motion was normal,  He reported a history of intermittent atrial fibrillation, still feel intermittent palpitations, even though MRI of the brain showed no acute posterior circulation stroke, he had 2 recurrent episode of transient dizziness unsteady gait, gradually recovered ALSO suggestive of posterior circulation stroke,  REVIEW OF SYSTEMS: Full 14 system review of systems performed and notable only for fatigue, urination problems, weakness  ALLERGIES: Allergies  Allergen Reactions  . Fenofibrate Other (See Comments)    Muscle weakness.    . Statins Other (See Comments)    Other Reaction: MYALGIA Other reaction(s): Other (See Comments) Other Reaction: MYALGIA Muscle pain    HOME MEDICATIONS: Current Outpatient Prescriptions  Medication Sig Dispense Refill  . acyclovir (ZOVIRAX) 800 MG tablet Take 1 tablet (800 mg total) by mouth 2 (two) times daily. 180 tablet 3  . aspirin 325 MG tablet Take 325 mg by  mouth daily.    . diclofenac sodium (VOLTAREN) 1 % GEL Apply 4 g topically 4 (four) times daily as needed. 300 g prn  . gabapentin (NEURONTIN) 600 MG tablet Take 1 tablet (600 mg total) by mouth 3 (three) times  daily. 270 tablet 3  . latanoprost (XALATAN) 0.005 % ophthalmic solution Place 1 drop into the right eye at bedtime.    Marland Kitchen losartan (COZAAR) 50 MG tablet Take 1 tablet (50 mg total) by mouth daily. 90 tablet 3  . metFORMIN (GLUCOPHAGE) 850 MG tablet Take 1 tablet (850 mg total) by mouth 2 (two) times daily with a meal. 180 tablet 3  . metoprolol tartrate (LOPRESSOR) 25 MG tablet Take 1 tablet (25 mg total) by mouth 2 (two) times daily. 180 tablet 3  . nitroGLYCERIN (NITROSTAT) 0.4 MG SL tablet Place 1 tablet (0.4 mg total) under the tongue every 5 (five) minutes as needed for chest pain (max 3 doses). 25 tablet 6   No current facility-administered medications for this visit.     PAST MEDICAL HISTORY: Past Medical History:  Diagnosis Date  . Anginal pain ([place])   . Anxiety   . Back pain   . Chest pain   . 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 Diabetes mellitus   . Diabetic neuropathy ([place])   . Diverticulosis   . Diverticulosis   . Erectile dysfunction   . Family history of anesthesia complication    ' they cant wake my brother very easy"  . Fatty liver   . Flank pain   . GERD (gastroesophageal reflux disease)   . Heart murmur   . History of bronchitis   . History of chicken pox   . 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 Hypercholesterolemia   . Hypertension   . Kidney stones   . Osteoarthritis     PAST SURGICAL HISTORY: Past Surgical History:  Procedure Laterality Date  . CARDIAC CATHETERIZATION  08/21/2013   DR Angelena Form  . CHOLECYSTECTOMY  1983  . CORONARY ARTERY BYPASS GRAFT N/A 08/23/2013   Procedure: CORONARY ARTERY BYPASS GRAFTING (CABG);  Surgeon: Grace Isaac, MD;  Location: [place];  Service: Open Heart Surgery;  Laterality: N/A;  CABG times six utilizing the left internal mammary artery and the right greater saphenous vein harvested endoscopically  . ENDOVEIN HARVEST OF GREATER SAPHENOUS VEIN Right 08/23/2013   Procedure:  ENDOVEIN HARVEST OF GREATER SAPHENOUS VEIN;  Surgeon: Grace Isaac, MD;  Location: [place];  Service: Open Heart Surgery;  Laterality: Right;  . HERNIA REPAIR     X3  . INTRAOPERATIVE TRANSESOPHAGEAL ECHOCARDIOGRAM N/A 08/23/2013   Procedure: INTRAOPERATIVE TRANSESOPHAGEAL ECHOCARDIOGRAM;  Surgeon: Grace Isaac, MD;  Location: [place];  Service: Open Heart Surgery;  Laterality: N/A;  . LEFT HEART CATHETERIZATION WITH CORONARY ANGIOGRAM N/A 08/21/2013   Procedure: LEFT HEART CATHETERIZATION WITH CORONARY ANGIOGRAM;  Surgeon: Burnell Blanks, MD;  Location: [facility] CATH LAB;  Service: Cardiovascular;  Laterality: N/A;    FAMILY HISTORY: Family History  Problem Relation Age of Onset  . Stroke Mother   . Diabetes Mother   . Heart disease Father        heart failure  . Colon cancer Maternal Grandmother   . Parkinsonism Maternal Grandfather   . Stroke Paternal Grandfather   . Prostate cancer Maternal Uncle   . CAD Brother        CABG    SOCIAL HISTORY:  Social History   Social History  . Marital status: Married    Spouse name: N/A  . Number of children: 2  . Years of education: HS   Occupational History  . Retired    Social History Main Topics  . Smoking status: Never Smoker  . Smokeless tobacco: Never Used  . Alcohol use No  . Drug use: No  . Sexual activity: Not on file   Other Topics Concern  . 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   Right-handed.   2 cups caffeine daily.     PHYSICAL EXAM   Vitals:   04/11/17 1130  BP: (!) 186/76  Pulse: (!) 52  Weight: 165 lb 4 oz (75 kg)  Height: 5\' 6"  (1.676 m)    Not recorded      Body mass index is 26.67 kg/m.  PHYSICAL EXAMNIATION:  Gen: NAD, conversant, well nourised, obese, well groomed                     Cardiovascular: Regular rate rhythm, no peripheral  edema, warm, nontender. Eyes: Conjunctivae clear without exudates or hemorrhage Neck: Supple, no carotid bruits. Pulmonary: Clear to auscultation bilaterally   NEUROLOGICAL EXAM:  MENTAL STATUS: Speech:    Speech is normal; fluent and spontaneous with normal comprehension.  Cognition:     Orientation to time, place and person     Normal recent and remote memory     Normal Attention span and concentration     Normal Language, naming, repeating,spontaneous speech     Fund of knowledge   CRANIAL NERVES: CN II: Visual fields are full to confrontation. Fundoscopic exam is normal with sharp discs and no vascular changes. Pupils are round equal and briskly reactive to light. CN III, IV, VI: extraocular movement are normal. No ptosis. CN V: Facial sensation is intact to pinprick in all 3 divisions bilaterally. Corneal responses are intact.  CN VII: Face is symmetric with normal eye closure and smile. CN VIII: Hearing is normal to rubbing fingers CN IX, X: Palate elevates symmetrically. Phonation is normal. CN XI: Head turning and shoulder shrug are intact CN XII: Tongue is midline with normal movements and no atrophy.  MOTOR: There is no pronator drift of out-stretched arms. Muscle bulk and tone are normal. Muscle strength is normal.  REFLEXES: Reflexes are 3 and symmetric at the biceps, triceps, knees, and ankles. Plantar responses are extensor  SENSORY: Intact to light touch, pinprick, positional sensation and vibratory sensation are intact in fingers and toes.  COORDINATION: He has mild trunk ataxia, tends to lean towards the left side, mild left heel-to-shin dysmetria,  GAIT/STANCE: He is able to arm cross, getting up from seated position without difficulty, mild difficulty turning  DIAGNOSTIC DATA (LABS, IMAGING, TESTING) - I reviewed patient records, labs, notes, testing and imaging myself where available.   ASSESSMENT AND PLAN  [name] [name] is a 76 y.o. 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 Ph.D.  [facility] [address], [place], [place] [address] Ph: [phone] Fax: [phone]  [email or web address], Elveria Rising, MD

## 2017-04-11 NOTE — Telephone Encounter (Signed)
Spoke to patient - he has been scheduled for 11am today.  He will arrive early for check-in.

## 2017-04-12 ENCOUNTER — Telehealth: Payer: Self-pay | Admitting: Neurology

## 2017-04-12 ENCOUNTER — Other Ambulatory Visit: Payer: Self-pay | Admitting: *Deleted

## 2017-04-12 DIAGNOSIS — I63423 Cerebral infarction due to embolism of bilateral anterior cerebral arteries: Secondary | ICD-10-CM

## 2017-04-12 NOTE — Telephone Encounter (Signed)
Please let patient know, I also ordered cardiac monitoring to evaluation his atrial fibrillation, also check who is his cardiologist.

## 2017-04-13 NOTE — Addendum Note (Signed)
Addended by: Marcial Pacas on: 04/13/2017 09:41 AM   Modules accepted: Orders

## 2017-04-13 NOTE — Telephone Encounter (Signed)
Spoke to patient - he rarely see his cardiologist, Dr. Carley Hammed.  He is agreeable to have his 30-day cardiac monitoring at any cardiology location.  Dr. Krista Blue has placed the order and patient is aware to expect a call for scheduling.

## 2017-04-23 ENCOUNTER — Other Ambulatory Visit: Payer: PPO

## 2017-04-27 ENCOUNTER — Telehealth: Payer: Self-pay | Admitting: Neurology

## 2017-04-27 NOTE — Telephone Encounter (Signed)
Patient called office returning call but is not to sure who is was from.  Can you help him?

## 2017-04-27 NOTE — Telephone Encounter (Signed)
Pt asking for a call back from [place] re: if it will be Dr Krista Blue will put the cardiac monitor on him and if so when, please call

## 2017-05-01 NOTE — Telephone Encounter (Signed)
I Called The patient he thinks he is ok with every thing he will call me back if he needs my help.

## 2017-05-06 ENCOUNTER — Ambulatory Visit
Admission: RE | Admit: 2017-05-06 | Discharge: 2017-05-06 | Disposition: A | Discharge status: Home / Self Care | Payer: PPO | Source: Ambulatory Visit | Attending: Neurology | Admitting: Neurology

## 2017-05-06 DIAGNOSIS — I63429 Cerebral infarction due to embolism of unspecified anterior cerebral artery: Secondary | ICD-10-CM | POA: Diagnosis not present

## 2017-05-06 DIAGNOSIS — R269 Unspecified abnormalities of gait and mobility: Secondary | ICD-10-CM | POA: Diagnosis not present

## 2017-05-06 DIAGNOSIS — I639 Cerebral infarction, unspecified: Secondary | ICD-10-CM | POA: Diagnosis not present

## 2017-05-06 DIAGNOSIS — I480 Paroxysmal atrial fibrillation: Secondary | ICD-10-CM

## 2017-05-06 MED ORDER — GADOBENATE DIMEGLUMINE 529 MG/ML IV SOLN
16.0000 mL | Freq: Once | INTRAVENOUS | Status: AC | PRN
  Administered 2017-05-06: 16 mL via INTRAVENOUS

## 2017-05-12 NOTE — Telephone Encounter (Signed)
ERROR

## 2017-05-15 ENCOUNTER — Encounter: Payer: Self-pay | Admitting: Physician Assistant

## 2017-05-15 ENCOUNTER — Ambulatory Visit (INDEPENDENT_AMBULATORY_CARE_PROVIDER_SITE_OTHER): Payer: PPO | Admitting: Physician Assistant

## 2017-05-15 VITALS — BP 130/72 | HR 57 | Ht 66.0 in | Wt 165.0 lb

## 2017-05-15 DIAGNOSIS — Z951 Presence of aortocoronary bypass graft: Secondary | ICD-10-CM

## 2017-05-15 DIAGNOSIS — E7800 Pure hypercholesterolemia, unspecified: Secondary | ICD-10-CM | POA: Diagnosis not present

## 2017-05-15 DIAGNOSIS — I480 Paroxysmal atrial fibrillation: Secondary | ICD-10-CM | POA: Diagnosis not present

## 2017-05-15 DIAGNOSIS — I63429 Cerebral infarction due to embolism of unspecified anterior cerebral artery: Secondary | ICD-10-CM | POA: Diagnosis not present

## 2017-05-15 DIAGNOSIS — I10 Essential (primary) hypertension: Secondary | ICD-10-CM | POA: Diagnosis not present

## 2017-05-15 DIAGNOSIS — E1149 Type 2 diabetes mellitus with other diabetic neurological complication: Secondary | ICD-10-CM | POA: Diagnosis not present

## 2017-05-15 NOTE — Progress Notes (Signed)
Cardiology Office Note    Date:  05/15/2017   ID:  [name] [name], [name] [name] March 12, 1941, MRN [id number]  PCP:  Tonia Ghent, MD  Cardiologist: Dr. Angelena Form  Chief Complaint  Patient presents with  . Atrial Fibrillation    History of Present Illness:  [name] [name] is a 76 y.o. male who is being seen today for the evaluation of atrial fibrillation at the request of Marcial Pacas, MD.  History of CAD status post CABG 6, 08/23/13 with postop A. fib converted to normal sinus rhythm, normal LVEF 60%, hypertension, HLD intolerant to statins, DM, mild AI. Palpitations 09/2015 and monitor showed normal sinus rhythm with PACs-no Afib.   Patient saw neurology for recurrent falls since May 2018. MRI showed small subacute infarction of the bilateral centrum semi-ovale consistent with embolic event. 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 Related to atrial fibrillation. He was started on Eliquis 5 mg BID. 2-D echo 02/2017 to follow-up AI showed normal LV function EF 55-60%, grade 1 DD, trivial AI, patent foramen ovale, left atrium was normal in size.  Patient comes in today accompanied by his wife. He is voicing his frustration over not being treated for A. fib in the past. I explained to him that we only had documented A. fib after his CABG and it was not documented on his monitor. He says sometimes his heart races for hours as wife gives him an extra metoprolol to settle it down. He denies any chest pain, palpitations, dyspnea, dyspnea on exertion, dizziness or presyncope. He feels bad when his heart is racing. He is in atrial fibrillation today at 57 bpm cannot feel it.  Past Medical History:  Diagnosis Date  . Anginal pain ([place])   . Anxiety   . Back pain   . Chest pain   . 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 Diabetes mellitus   . Diabetic neuropathy ([place])   . Diverticulosis   . Diverticulosis   . Erectile dysfunction   . Family history of anesthesia complication     ' they cant wake my brother very easy"  . Fatty liver   . Flank pain   . GERD (gastroesophageal reflux disease)   . Heart murmur   . History of bronchitis   . History of chicken pox   . 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 Hypercholesterolemia   . Hypertension   . Kidney stones   . Osteoarthritis     Past Surgical History:  Procedure Laterality Date  . CARDIAC CATHETERIZATION  08/21/2013   DR Angelena Form  . CHOLECYSTECTOMY  1983  . CORONARY ARTERY BYPASS GRAFT N/A 08/23/2013   Procedure: CORONARY ARTERY BYPASS GRAFTING (CABG);  Surgeon: Grace Isaac, MD;  Location: [facility];  Service: Open Heart Surgery;  Laterality: N/A;  CABG times six utilizing the left internal mammary artery and the right greater saphenous vein harvested endoscopically  . ENDOVEIN HARVEST OF GREATER SAPHENOUS VEIN Right 08/23/2013   Procedure: ENDOVEIN HARVEST OF GREATER SAPHENOUS VEIN;  Surgeon: Grace Isaac, MD;  Location: [facility];  Service: Open Heart Surgery;  Laterality: Right;  . HERNIA REPAIR     X3  . INTRAOPERATIVE TRANSESOPHAGEAL ECHOCARDIOGRAM N/A 08/23/2013   Procedure: INTRAOPERATIVE TRANSESOPHAGEAL ECHOCARDIOGRAM;  Surgeon: Grace Isaac, MD;  Location: [facility] [place];  Service: Open Heart Surgery;  Laterality: N/A;  . LEFT HEART CATHETERIZATION WITH CORONARY ANGIOGRAM N/A 08/21/2013   Procedure: LEFT HEART  CATHETERIZATION WITH CORONARY ANGIOGRAM;  Surgeon: Burnell Blanks, MD;  Location: [facility] CATH LAB;  Service: Cardiovascular;  Laterality: N/A;    Current Medications: Current Meds  Medication Sig  . acyclovir (ZOVIRAX) 800 MG tablet Take 1 tablet (800 mg total) by mouth 2 (two) times daily.  Marland Kitchen apixaban (ELIQUIS) 5 MG TABS tablet Take 1 tablet (5 mg total) by mouth 2 (two) times daily.  . diclofenac sodium (VOLTAREN) 1 % GEL Apply 4 g topically 4 (four) times daily as needed.  . gabapentin (NEURONTIN) 600 MG tablet Take 1 tablet (600 mg total) by mouth 3 (three) times daily.    Marland Kitchen latanoprost (XALATAN) 0.005 % ophthalmic solution Place 1 drop into the right eye at bedtime.  Marland Kitchen losartan (COZAAR) 50 MG tablet Take 1 tablet (50 mg total) by mouth daily.  . metFORMIN (GLUCOPHAGE) 850 MG tablet Take 1 tablet (850 mg total) by mouth 2 (two) times daily with a meal.  . metoprolol tartrate (LOPRESSOR) 25 MG tablet Take 1 tablet (25 mg total) by mouth 2 (two) times daily.  . nitroGLYCERIN (NITROSTAT) 0.4 MG SL tablet Place 1 tablet (0.4 mg total) under the tongue every 5 (five) minutes as needed for chest pain (max 3 doses).     Allergies:   Fenofibrate and Statins   Social History   Social History  . Marital status: Married    Spouse name: N/A  . Number of children: 2  . Years of education: HS   Occupational History  . Retired    Social History Main Topics  . Smoking status: Never Smoker  . Smokeless tobacco: Never Used  . Alcohol use No  . Drug use: No  . Sexual activity: Not Asked   Other Topics Concern  . 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   Right-handed.   2 cups caffeine daily.     Family History:  The patient's family history includes CAD in his brother; Colon cancer in his maternal grandmother; Diabetes in his mother; Heart disease in his father; Parkinsonism in his maternal grandfather; Prostate cancer in his maternal uncle; Stroke in his mother and paternal grandfather.   ROS:   Please see the history of present illness.    Review of Systems  Constitution: Negative.  HENT: Negative.   Cardiovascular: Positive for irregular heartbeat and palpitations.  Respiratory: Negative.   Endocrine: Negative.   Hematologic/Lymphatic: Negative.   Musculoskeletal: Negative.   Gastrointestinal: Negative.   Genitourinary: Negative.   Neurological: Positive for difficulty with concentration and loss of balance.   All  other systems reviewed and are negative.   PHYSICAL EXAM:   VS:  There were no vitals taken for this visit.  Physical Exam  GEN: Well nourished, well developed, in no acute distress  Neck: no JVD, carotid bruits, or masses Cardiac:Irregular irregular; no murmurs, rubs, or gallops  Respiratory:  clear to auscultation bilaterally, normal work of breathing GI: soft, nontender, nondistended, + BS Ext: without cyanosis, clubbing, or edema, Good distal pulses bilaterally Neuro:  Alert and Oriented x 3 Psych: euthymic mood, full affect  Wt Readings from Last 3 Encounters:  04/11/17 165 lb 4 oz (75 kg)  03/30/17 165 lb (74.8 kg)  03/22/17 164 lb 12 oz (74.7 kg)      Studies/Labs Reviewed:   EKG:  EKG is ordered today.  The ekg ordered today demonstrates Atrial fibrillation at 57 bpm  Recent Labs: 01/18/2017: TSH 2.18 03/23/2017: ALT 16; BUN 16; Creatinine, Ser 0.91; Hemoglobin 14.7; Platelets 187.0; Potassium 5.0; Sodium 137   Lipid Panel    Component Value Date/Time   CHOL 122 03/23/2017 0824   TRIG 226.0 (H) 03/23/2017 0824   HDL 29.10 (L) 03/23/2017 0824   CHOLHDL 4 03/23/2017 0824   VLDL 45.2 (H) 03/23/2017 0824   LDLCALC 82 07/26/2016 0933   LDLDIRECT 72.0 03/23/2017 0824    Additional studies/ records that were reviewed today include:   2-D echo 02/16/17 Study Conclusions   - Left ventricle: The cavity size was normal. Wall thickness was   increased in a pattern of mild LVH. Systolic function was normal.   The estimated ejection fraction was in the range of 55% to 60%.   Wall motion was normal; there were no regional wall motion   abnormalities. Doppler parameters are consistent with abnormal   left ventricular relaxation (grade 1 diastolic dysfunction). - Aortic valve: Valve mobility was mildly restricted. There was   trivial regurgitation. - Aortic root: The aortic root was mildly dilated. - Right ventricle: The cavity size was mildly dilated. - Atrial septum:  There was a patent foramen ovale.   Impressions:   - Normal LV systolic function; mild diastolic dysfunction;   sclerotic aortic valve with trace AI; mildly dilated aortic root;   mild RVE; PFO noted by color doppler.   --------------------- 9/30/18IMPRESSION:  This is a normal MR angiogram of the neck arteries.      MRA of the head 9/29/18IMPRESSION:  This MR angiogram of the intracranial arteries shows the following: 1.    There is normal variant anatomy with an aplastic A1 segment of the right anterior cerebral artery and fetal origins of both posterior cerebral arteries and the left vertebral artery terminating as the left posterior inferior cerebellar artery. Of note, in 2012, there was a small branch of the left vertebral artery that joined the right vertebral artery to form the basilar artery that is not apparent on the current study. This could be due to occlusion of this artery or to difference in technique or to artifact. 2.     There is some irregularity of the P2 segments of the posterior cerebral arteries that was not apparent on the 2012 MRI. This difference could be due to atherosclerotic stenosis or to difference in technique or to artifact.   MRI of the head 9/1/18IMPRESSION: 1. Small subacute (to early chronic on the right) white matter lacunar infarcts in the bilateral centrum semiovale. Possible petechial hemorrhage on the right, but no associated mass effect. These may be the sequelae of atrial fibrillation in this clinical setting. 2. No superimposed acute infarct identified. 3. Study discussed by telephone with Dr. Jaynee Eagles on 04/08/2017 at 18:22 .     Electronically Signed   By: Genevie Ann M.D.   On: 04/08/2017 18:26     ASSESSMENT:    1. 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 Paroxysmal atrial fibrillation ([place])   3. S/P CABG x 6   4. Essential hypertension   5.  Hypercholesteremia   6. 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 fib as the cause. Patient is in atrial fibrillation today. This is  a first-time it's been documented since post CABG. Patient does have a patent foramen ovale on 2-D echo 02/16/17. This could also be the cause. Discussed with Dr. Rayann Heman who recommends hospitalization for initiation of Tikosyn. He could also be referred to Dr. Burt Knack for PFO closure. I discussed this with the patient in detail. He thinks stress and anxiety cause his heart to race and that he can control that. I explained to him that he is in atrial fibrillation with a controlled rate today and he is asymptomatic. He'd like to think about it and in the meantime continue Eliquis. We will have him follow-up in the atrial fibrillation clinic as well as with Dr. Angelena Form for further management and discussion of anti arrhythmics and possible PFO closure  PAF patient had atrial fib post CABG but none documented until today although he's had the symptoms.. Palpitations 09/2015 a monitor showed normal sinus rhythm with PACs and no A. Fib. See above for recommendations.  Status post CABG 6 2015 without angina.  Essential hypertension controlled  Hypercholesterolemia statin intolerant  Diabetes mellitus type 2 managed by primary care    Medication Adjustments/Labs and Tests Ordered: Current medicines are reviewed at length with the patient today.  Concerns regarding medicines are outlined above.  Medication changes, Labs and Tests ordered today are listed in the Patient Instructions below. There are no Patient Instructions on file for this visit.   Sumner Boast, PA-C  05/15/2017 8:23 AM    [facility] [place], [place], [place]  [address] Phone: [phone]; Fax: [phone]

## 2017-05-15 NOTE — Patient Instructions (Signed)
Your physician recommends that you continue on your current medications as directed. Please refer to the Current Medication list given to you today. Your physician recommends that you schedule a follow-up appointment (first available) at  [facility] with Roderic Palau, Nurse Practioner  Your physician recommends that you schedule a follow-up appointment (next available) with Dr. Angelena Form.

## 2017-05-22 ENCOUNTER — Inpatient Hospital Stay (HOSPITAL_COMMUNITY): Admission: RE | Admit: 2017-05-22 | Payer: PPO | Source: Ambulatory Visit | Admitting: Nurse Practitioner

## 2017-05-25 ENCOUNTER — Ambulatory Visit (HOSPITAL_COMMUNITY)
Admission: RE | Admit: 2017-05-25 | Discharge: 2017-05-25 | Disposition: A | Discharge status: Home / Self Care | Payer: PPO | Source: Ambulatory Visit | Attending: Nurse Practitioner | Admitting: Nurse Practitioner

## 2017-05-25 ENCOUNTER — Encounter (HOSPITAL_COMMUNITY): Payer: Self-pay | Admitting: Nurse Practitioner

## 2017-05-25 VITALS — BP 163/80 | HR 52 | Ht 66.0 in | Wt 166.0 lb

## 2017-05-25 DIAGNOSIS — K219 Gastro-esophageal reflux disease without esophagitis: Secondary | ICD-10-CM | POA: Insufficient documentation

## 2017-05-25 DIAGNOSIS — Z7984 Long term (current) use of oral hypoglycemic drugs: Secondary | ICD-10-CM | POA: Insufficient documentation

## 2017-05-25 DIAGNOSIS — Z87442 Personal history of urinary calculi: Secondary | ICD-10-CM | POA: Insufficient documentation

## 2017-05-25 DIAGNOSIS — Z8673 Personal history of transient ischemic attack (TIA), and cerebral infarction without residual deficits: Secondary | ICD-10-CM | POA: Diagnosis not present

## 2017-05-25 DIAGNOSIS — I2510 Atherosclerotic heart disease of native coronary artery without angina pectoris: Secondary | ICD-10-CM | POA: Diagnosis not present

## 2017-05-25 DIAGNOSIS — Z79899 Other long term (current) drug therapy: Secondary | ICD-10-CM | POA: Diagnosis not present

## 2017-05-25 DIAGNOSIS — I10 Essential (primary) hypertension: Secondary | ICD-10-CM | POA: Diagnosis not present

## 2017-05-25 DIAGNOSIS — Q211 Atrial septal defect: Secondary | ICD-10-CM | POA: Insufficient documentation

## 2017-05-25 DIAGNOSIS — M1990 Unspecified osteoarthritis, unspecified site: Secondary | ICD-10-CM | POA: Diagnosis not present

## 2017-05-25 DIAGNOSIS — Z951 Presence of aortocoronary bypass graft: Secondary | ICD-10-CM | POA: Insufficient documentation

## 2017-05-25 DIAGNOSIS — I4891 Unspecified atrial fibrillation: Secondary | ICD-10-CM | POA: Insufficient documentation

## 2017-05-25 DIAGNOSIS — Z7901 Long term (current) use of anticoagulants: Secondary | ICD-10-CM | POA: Insufficient documentation

## 2017-05-25 DIAGNOSIS — E7800 Pure hypercholesterolemia, unspecified: Secondary | ICD-10-CM | POA: Insufficient documentation

## 2017-05-25 DIAGNOSIS — Z8249 Family history of ischemic heart disease and other diseases of the circulatory system: Secondary | ICD-10-CM | POA: Diagnosis not present

## 2017-05-25 DIAGNOSIS — F419 Anxiety disorder, unspecified: Secondary | ICD-10-CM | POA: Insufficient documentation

## 2017-05-25 DIAGNOSIS — E1140 Type 2 diabetes mellitus with diabetic neuropathy, unspecified: Secondary | ICD-10-CM | POA: Insufficient documentation

## 2017-05-25 NOTE — Progress Notes (Signed)
Primary Care Physician: Tonia Ghent, MD Referring Physician: Ermalinda Barrios, PA Cardiologist: Dr. Angelena Form Neurologist: Krista Blue,, [name] [name] is a 76 y.o. male with a h/o CAD status post CABG 6, 08/23/13 with postop A. fib converted to normal sinus rhythm, normal LVEF 60%, hypertension, HLD intolerant to statins, DM, mild AI. Palpitations 09/2015 and monitor showed normal sinus rhythm with PACs-no Afib.  Patient saw neurology for recurrent falls since May 2018. MRI showed small subacute infarction of the bilateral centrum semi-ovale consistent with embolic event. It was felt he had a brainstem/cerebellar stroke related to atrial fibrillation. He was started on Eliquis 5 mg BID. 2-D echo 02/2017 to follow-up AI showed normal LV function EF 55-60%, grade 1 DD, trivial AI, patent foramen ovale, left atrium was normal in size.  He saw Ermalinda Barrios 10/8 and was in afib in the 50's and was not aware he was in afib. He was sent to the afib clinic today to discusses options.  He states that he has had racing heart episodes in the past but has not noted in some time. His wife would usually give him an extra 1/2 metoprolol and it would settle it down. He states that he believes he has good energy, and no shortness of breath. He c/o's of H/A's  since the stroke. He has now been on anticoagulation stated by the neurologist, 9/5. He believes he has has a consistently irregular pulse for some weeks.   Recent echo shows Normal EF with a small PFO.  Today, he denies symptoms of palpitations, chest pain, shortness of breath, orthopnea, PND, lower extremity edema, dizziness, presyncope, syncope, or neurologic sequela.+ intermittent  H/A since stroke. The patient is tolerating medications without difficulties and is otherwise without complaint today.   Past Medical History:  Diagnosis Date  . Anginal pain ([place])   . Anxiety   . Back pain   . Chest pain   . Coronary artery disease 08/21/2013   severe  triple vessel  . Diabetes mellitus   . Diabetic neuropathy ([place])   . Diverticulosis   . Diverticulosis   . Erectile dysfunction   . Family history of anesthesia complication    ' they cant wake my brother very easy"  . Fatty liver   . Flank pain   . GERD (gastroesophageal reflux disease)   . Heart murmur   . History of bronchitis   . History of chicken pox   . 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 Hypercholesterolemia   . Hypertension   . Kidney stones   . Osteoarthritis    Past Surgical History:  Procedure Laterality Date  . CARDIAC CATHETERIZATION  08/21/2013   DR Angelena Form  . CHOLECYSTECTOMY  1983  . CORONARY ARTERY BYPASS GRAFT N/A 08/23/2013   Procedure: CORONARY ARTERY BYPASS GRAFTING (CABG);  Surgeon: Grace Isaac, MD;  Location: [facility];  Service: Open Heart Surgery;  Laterality: N/A;  CABG times six utilizing the left internal mammary artery and the right greater saphenous vein harvested endoscopically  . ENDOVEIN HARVEST OF GREATER SAPHENOUS VEIN Right 08/23/2013   Procedure: ENDOVEIN HARVEST OF GREATER SAPHENOUS VEIN;  Surgeon: Grace Isaac, MD;  Location: [facility];  Service: Open Heart Surgery;  Laterality: Right;  . HERNIA REPAIR     X3  . INTRAOPERATIVE TRANSESOPHAGEAL ECHOCARDIOGRAM N/A 08/23/2013   Procedure: INTRAOPERATIVE TRANSESOPHAGEAL ECHOCARDIOGRAM;  Surgeon: Grace Isaac, MD;  Location: [facility];  Service: Open Heart Surgery;  Laterality: N/A;  . LEFT HEART CATHETERIZATION WITH CORONARY ANGIOGRAM N/A 08/21/2013   Procedure: LEFT HEART CATHETERIZATION WITH CORONARY ANGIOGRAM;  Surgeon: Burnell Blanks, MD;  Location: [facility] CATH LAB;  Service: Cardiovascular;  Laterality: N/A;    Current Outpatient Prescriptions  Medication Sig Dispense Refill  . acyclovir (ZOVIRAX) 800 MG tablet Take 1 tablet (800 mg total) by mouth 2 (two) times daily. 180 tablet 3  . apixaban (ELIQUIS) 5 MG TABS tablet Take 1 tablet (5 mg total) by mouth 2 (two)  times daily. 60 tablet 11  . diclofenac sodium (VOLTAREN) 1 % GEL Apply 4 g topically 4 (four) times daily as needed. 300 g prn  . gabapentin (NEURONTIN) 600 MG tablet Take 1 tablet (600 mg total) by mouth 3 (three) times daily. 270 tablet 3  . latanoprost (XALATAN) 0.005 % ophthalmic solution Place 1 drop into the right eye at bedtime.    Marland Kitchen losartan (COZAAR) 50 MG tablet Take 1 tablet (50 mg total) by mouth daily. 90 tablet 3  . metFORMIN (GLUCOPHAGE) 850 MG tablet Take 1 tablet (850 mg total) by mouth 2 (two) times daily with a meal. 180 tablet 3  . metoprolol tartrate (LOPRESSOR) 25 MG tablet Take 1 tablet (25 mg total) by mouth 2 (two) times daily. 180 tablet 3  . nitroGLYCERIN (NITROSTAT) 0.4 MG SL tablet Place 1 tablet (0.4 mg total) under the tongue every 5 (five) minutes as needed for chest pain (max 3 doses). 25 tablet 6   No current facility-administered medications for this encounter.     Allergies  Allergen Reactions  . Fenofibrate Other (See Comments)    Muscle weakness.    . Statins Other (See Comments)    Other Reaction: MYALGIA Other reaction(s): Other (See Comments) Other Reaction: MYALGIA Muscle pain    Social History   Social History  . Marital status: Married    Spouse name: N/A  . Number of children: 2  . Years of education: HS   Occupational History  . Retired    Social History Main Topics  . Smoking status: Never Smoker  . Smokeless tobacco: Never Used  . Alcohol use No  . Drug use: No  . Sexual activity: Not on file   Other Topics Concern  . 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   Right-handed.   2 cups caffeine daily.    Family History  Problem Relation Age of Onset  . Stroke Mother   . Diabetes Mother   . Heart disease Father        heart failure  . Colon cancer Maternal Grandmother   .  Parkinsonism Maternal Grandfather   . Stroke Paternal Grandfather   . Prostate cancer Maternal Uncle   . CAD Brother        CABG    ROS- All systems are reviewed and negative except as per the HPI above  Physical Exam: Vitals:   05/25/17 0837  BP: (!) 163/80  Pulse: (!) 52  Weight: 166 lb (75.3 kg)  Height: 5\' 6"  (1.676 m)   Wt Readings from Last 3 Encounters:  05/25/17 166 lb (75.3 kg)  05/15/17 165 lb (74.8 kg)  04/11/17 165 lb 4 oz (75 kg)    Labs: Lab Results  Component Value Date   NA 137 03/23/2017   K 5.0 03/23/2017  CL 104 03/23/2017   CO2 29 03/23/2017   GLUCOSE 156 (H) 03/23/2017   BUN 16 03/23/2017   CREATININE 0.91 03/23/2017   CALCIUM 9.7 03/23/2017   PHOS 3.2 06/02/2014   MG 2.1 06/02/2014   Lab Results  Component Value Date   INR 1.38 08/30/2013   Lab Results  Component Value Date   CHOL 122 03/23/2017   HDL 29.10 (L) 03/23/2017   LDLCALC 82 07/26/2016   TRIG 226.0 (H) 03/23/2017     GEN- The patient is well appearing, alert and oriented x 3 today.   Head- normocephalic, atraumatic Eyes-  Sclera clear, conjunctiva pink Ears- hearing intact Oropharynx- clear Neck- supple, no JVP Lymph- no cervical lymphadenopathy Lungs- Clear to ausculation bilaterally, normal work of breathing Heart- irregular rate and rhythm, no murmurs, rubs or gallops, PMI not laterally displaced GI- soft, NT, ND, + BS Extremities- no clubbing, cyanosis, or edema MS- no significant deformity or atrophy Skin- no rash or lesion Psych- euthymic mood, full affect Neuro- strength and sensation are intact  EKG-afib with slow VR at 52 bpm, qrs int 92 ms, qtc 409 ms Epic records reviewed    Assessment and Plan: 1. Atrial fibrillation S/P CVA thought 2/2 to afib He is rate controlled, in fact on the slow side Continue on metoprolol Pt is on eliquis 5 mg bid since 9/5, with a chadsvasc score of at least 7 Options discussed with pt, AAD, amiodarone, Tikosyn,  sotalol When Selinda Eon discussed with Dr. Rayann Heman, he favored Tikosyn He is not thrilled about hospitalization He also asks if cardioversion would be an option and it could be considered if he is in afib persistently. He does not appear to be symptomatic with afib and does not feel compelled to do anything at this time He would like to think about it and  will keep a chart daily to see if HR is staying irregular and come back to office to further discuss He has been given Tikosyn info sheet to check on price in case he chooses that option  2. PFO Discussion with pt with Gerrianne Scale that this may need repair by Dr. Burt Knack  3. CAD S/p bypass No exertional symptoms  4. HTN Elevated, per pt white coat syndrome  He is pending appointment first available with Dr. Angelena Form to discuss above.  F/u in afib clinic in 3 weeks  Geroge Baseman. Cristianna Cyr, [facility] [address] [place], [place] [address] [phone]

## 2017-06-01 ENCOUNTER — Ambulatory Visit: Payer: PPO | Admitting: Neurology

## 2017-06-02 ENCOUNTER — Other Ambulatory Visit: Payer: Self-pay | Admitting: *Deleted

## 2017-06-02 MED ORDER — METFORMIN HCL 850 MG PO TABS: 850.0000 mg | ORAL_TABLET | Freq: Two times a day (BID) | ORAL | 1 refills | Status: DC

## 2017-06-05 ENCOUNTER — Other Ambulatory Visit: Payer: PPO

## 2017-06-05 ENCOUNTER — Ambulatory Visit: Payer: PPO | Admitting: Neurology

## 2017-06-06 ENCOUNTER — Other Ambulatory Visit: Payer: Self-pay | Admitting: *Deleted

## 2017-06-06 NOTE — Telephone Encounter (Signed)
Faxed refill request.  Last office visit:   03/22/17 Medication:  Fenofibrate was not on patient's current meds list, added for refill request.  Please advise.

## 2017-06-07 ENCOUNTER — Other Ambulatory Visit (INDEPENDENT_AMBULATORY_CARE_PROVIDER_SITE_OTHER): Payer: PPO

## 2017-06-07 DIAGNOSIS — E7800 Pure hypercholesterolemia, unspecified: Secondary | ICD-10-CM

## 2017-06-07 LAB — LIPID PANEL
CHOLESTEROL: 112 mg/dL (ref 0–200)
HDL: 29.6 mg/dL — ABNORMAL LOW (ref >39.00)
LDL Cholesterol: 62 mg/dL (ref 0–99)
NonHDL: 82.06
TRIGLYCERIDES: 99 mg/dL (ref 0.0–149.0)
Total CHOL/HDL Ratio: 4
VLDL: 19.8 mg/dL (ref 0.0–40.0)

## 2017-06-07 NOTE — Telephone Encounter (Signed)
Med prev stopped, intolerant.  Thanks.  rx denied.

## 2017-06-15 ENCOUNTER — Telehealth: Payer: Self-pay | Admitting: Cardiovascular Disease

## 2017-06-15 ENCOUNTER — Telehealth: Payer: Self-pay | Admitting: Physician Assistant

## 2017-06-15 ENCOUNTER — Encounter: Payer: Self-pay | Admitting: Cardiovascular Disease

## 2017-06-15 NOTE — Telephone Encounter (Signed)
error 

## 2017-06-15 NOTE — Telephone Encounter (Signed)
New message   Pt want to speak to RN   Pt verbalized that he is having issues but don't want to answer any of the SmartPhrase questions

## 2017-06-15 NOTE — Telephone Encounter (Signed)
Spoke with pt who is very frustrated with obtaining an appt to f/u with Dr Angelena Form.  Reports he received a letter that he is due in November for f/u however there are no appts available until 3/19.  Advised I understand and will forward this to his nurse to review the schedule.  Pt is aware Fraser Din will be calling him back once she returns.

## 2017-06-16 NOTE — Telephone Encounter (Signed)
I spoke with pt. Recall is for one year follow up. Pt has since been seen by Gerrianne Scale, PA and in afib clinic.  Pt would like to come in and see Dr. Angelena Form soon.  I scheduled him for appt on 06/23/17 at 4:00

## 2017-06-19 ENCOUNTER — Ambulatory Visit: Payer: PPO | Admitting: Family Medicine

## 2017-06-19 ENCOUNTER — Encounter: Payer: Self-pay | Admitting: Family Medicine

## 2017-06-19 VITALS — BP 160/70 | HR 71 | Temp 97.5°F | Wt 166.2 lb

## 2017-06-19 DIAGNOSIS — E119 Type 2 diabetes mellitus without complications: Secondary | ICD-10-CM | POA: Diagnosis not present

## 2017-06-19 DIAGNOSIS — E7800 Pure hypercholesterolemia, unspecified: Secondary | ICD-10-CM | POA: Diagnosis not present

## 2017-06-19 DIAGNOSIS — E1149 Type 2 diabetes mellitus with other diabetic neurological complication: Secondary | ICD-10-CM

## 2017-06-19 DIAGNOSIS — I63429 Cerebral infarction due to embolism of unspecified anterior cerebral artery: Secondary | ICD-10-CM

## 2017-06-19 DIAGNOSIS — I4891 Unspecified atrial fibrillation: Secondary | ICD-10-CM | POA: Diagnosis not present

## 2017-06-19 NOTE — Patient Instructions (Addendum)
Call the eye clinic and ask them to fill the eye drop until your appointment.  If they won't, then let me know.   [name] [name] will call about your referral. Go to the lab on the way out.  We'll contact you with your lab report. Take care.  Glad to see you.

## 2017-06-19 NOTE — Progress Notes (Signed)
Patient wanted to get transferred within cardiology over to [facility] to see Dr. Rockey Situ.  Currently anticoagulated due to A. fib.  Compliant with medications.  No adverse effect of medications.  Irregular pulse noted by patient but no heart racing noted.  DM2.  Due for A1c, d/w pt.   Due for eye exam, scheduled in early 2019.  D/w pt.  No foot sx.  Not checking sugar at home.   Lipids controlled on last check.   He had significant statin and fenofibrate reactions.  Off medications currently.  His leg symptoms did improve off medication. Med allergy list d/w pt.   He has noted some occ changes in short term recall, but family hadn't noted.  No red flag events.  PMH and SH reviewed  ROS: Per HPI unless specifically indicated in ROS section   Meds, vitals, and allergies reviewed.   GEN: nad, alert and oriented HEENT: mucous membranes moist NECK: supple w/o LA CV: IRR, not tachy PULM: ctab, no inc wob ABD: soft, +bs EXT: no edema SKIN: no acute rash  3/3 attention.   Can do math and read a watch.   3/3 recall.   WORLD---->DLROW  Diabetic foot exam: Normal inspection No skin breakdown No calluses  Normal DP pulses Normal sensation to light touch and monofilament Nails normal

## 2017-06-20 ENCOUNTER — Telehealth: Payer: Self-pay | Admitting: Cardiovascular Disease

## 2017-06-20 LAB — HEMOGLOBIN A1C: Hgb A1c MFr Bld: 6.6 % — ABNORMAL HIGH (ref 4.6–6.5)

## 2017-06-20 NOTE — Assessment & Plan Note (Signed)
No new focal deficits. He has noted some occ changes in short term recall, but family hadn't noted.  No red flag events.  Normal memory testing today.  We will observe.  He agrees.

## 2017-06-20 NOTE — Assessment & Plan Note (Signed)
Normal foot exam.  A1c pending.  See notes on labs.

## 2017-06-20 NOTE — Assessment & Plan Note (Signed)
No change in meds.  He wanted to get set up with cardiology in [place].  Referred.  No emergent issues at this point.

## 2017-06-20 NOTE — Telephone Encounter (Signed)
That is ok with me. [name] [name] 

## 2017-06-20 NOTE — Assessment & Plan Note (Signed)
Previous lipids are reasonable.  Statin intolerant.  Labs discussed with patient.  Continue as is.  Allergy list discussed with patient. >25 minutes spent in face to face time with patient, >50% spent in counselling or coordination of care.

## 2017-06-20 NOTE — Telephone Encounter (Signed)
Pt called wanting to switch over to [place] office.  Pt would really like to see Dr Rockey Situ  Pt is currently set up to see [place] here in office on 07/10/17 with Christell Faith   Please advise if it is okay for patient to switch over to Dr Rockey Situ

## 2017-06-21 NOTE — Telephone Encounter (Signed)
Routed to scheduling

## 2017-06-21 NOTE — Telephone Encounter (Signed)
Okay to switch , thanks

## 2017-06-23 ENCOUNTER — Ambulatory Visit: Payer: PPO | Admitting: Cardiovascular Disease

## 2017-07-10 ENCOUNTER — Ambulatory Visit: Payer: PPO | Admitting: Physician Assistant

## 2017-07-11 ENCOUNTER — Ambulatory Visit: Payer: PPO | Admitting: Neurology

## 2017-07-18 ENCOUNTER — Ambulatory Visit: Payer: PPO | Admitting: Physician Assistant

## 2017-07-21 ENCOUNTER — Encounter: Payer: Self-pay | Admitting: Family Medicine

## 2017-07-21 ENCOUNTER — Ambulatory Visit: Payer: PPO | Admitting: Family Medicine

## 2017-07-21 VITALS — BP 170/72 | HR 76 | Temp 98.4°F | Wt 165.8 lb

## 2017-07-21 DIAGNOSIS — H00019 Hordeolum externum unspecified eye, unspecified eyelid: Secondary | ICD-10-CM

## 2017-07-21 DIAGNOSIS — H409 Unspecified glaucoma: Secondary | ICD-10-CM

## 2017-07-21 MED ORDER — LATANOPROST 0.005 % OP SOLN: 1.0000 [drp] | Freq: Every day | OPHTHALMIC | 0 refills | Status: DC

## 2017-07-21 NOTE — Patient Instructions (Addendum)
Rosaria Ferries will call about your referral. Use warm compresses on your eyelid.   Take care.  Glad to see you.

## 2017-07-21 NOTE — Progress Notes (Signed)
The snow caved in his back deck.  This was clearly a hassle for the patient.  Unclear how much this affects his BP. He can check his BP at home.    L upper lid lesion, tender.  Better today.  No vision loss.   He was last checked for glaucoma earlier this year and wants a second opinion re: eye clinic/glaucoma.  He may run out of xalatan in the meantime.  D/w pt.    Meds, vitals, and allergies reviewed.   ROS: Per HPI unless specifically indicated in ROS section   GEN: nad, alert and oriented HEENT: mucous membranes moist, L upper eyelid with stye noted.  No lesion on the lower left lid.  No lesions on the upper or lower lids on the right side.  No conjunctival irritation.  No foreign body noted.  Extraocular movements intact bilaterally NECK: supple w/o LA

## 2017-07-23 DIAGNOSIS — H409 Unspecified glaucoma: Secondary | ICD-10-CM | POA: Insufficient documentation

## 2017-07-23 DIAGNOSIS — H00019 Hordeolum externum unspecified eye, unspecified eyelid: Secondary | ICD-10-CM | POA: Insufficient documentation

## 2017-07-23 NOTE — Assessment & Plan Note (Signed)
Already some better today.  Should resolve with warm compresses.  Update me as needed.

## 2017-07-23 NOTE — Assessment & Plan Note (Signed)
I wrote him a refill for Xalatan and, to use if he ran out of his current prescription between this point and his ophthalmology follow-up.  It is not designed to be a long-term refill.  He will follow-up with ophthalmology.

## 2017-08-16 ENCOUNTER — Ambulatory Visit (INDEPENDENT_AMBULATORY_CARE_PROVIDER_SITE_OTHER): Payer: Medicare Other

## 2017-08-16 ENCOUNTER — Other Ambulatory Visit: Payer: Self-pay | Admitting: Family Medicine

## 2017-08-16 ENCOUNTER — Other Ambulatory Visit: Payer: Self-pay

## 2017-08-16 ENCOUNTER — Telehealth: Payer: Self-pay

## 2017-08-16 VITALS — BP 164/90 | HR 63 | Temp 97.7°F | Ht 67.0 in | Wt 163.8 lb

## 2017-08-16 DIAGNOSIS — I4891 Unspecified atrial fibrillation: Secondary | ICD-10-CM

## 2017-08-16 DIAGNOSIS — Z0000 Encounter for general adult medical examination without abnormal findings: Secondary | ICD-10-CM | POA: Diagnosis not present

## 2017-08-16 LAB — CBC WITH DIFFERENTIAL/PLATELET
BASOS PCT: 0.4 % (ref 0.0–3.0)
Basophils Absolute: 0 10*3/uL (ref 0.0–0.1)
EOS PCT: 2.8 % (ref 0.0–5.0)
Eosinophils Absolute: 0.2 10*3/uL (ref 0.0–0.7)
HEMATOCRIT: 48.9 % (ref 39.0–52.0)
HEMOGLOBIN: 16.2 g/dL (ref 13.0–17.0)
LYMPHS PCT: 33.8 % (ref 12.0–46.0)
Lymphs Abs: 2.4 10*3/uL (ref 0.7–4.0)
MCHC: 33.1 g/dL (ref 30.0–36.0)
MCV: 94.2 fl (ref 78.0–100.0)
MONOS PCT: 7.9 % (ref 3.0–12.0)
Monocytes Absolute: 0.6 10*3/uL (ref 0.1–1.0)
Neutro Abs: 3.8 10*3/uL (ref 1.4–7.7)
Neutrophils Relative %: 55.1 % (ref 43.0–77.0)
Platelets: 169 10*3/uL (ref 150.0–400.0)
RBC: 5.19 Mil/uL (ref 4.22–5.81)
RDW: 14.4 % (ref 11.5–15.5)
WBC: 7 10*3/uL (ref 4.0–10.5)

## 2017-08-16 LAB — COMPREHENSIVE METABOLIC PANEL
ALBUMIN: 4.6 g/dL (ref 3.5–5.2)
ALK PHOS: 65 U/L (ref 39–117)
ALT: 15 U/L (ref 0–53)
AST: 16 U/L (ref 0–37)
BUN: 14 mg/dL (ref 6–23)
CALCIUM: 10.5 mg/dL (ref 8.4–10.5)
CHLORIDE: 102 meq/L (ref 96–112)
CO2: 31 mEq/L (ref 19–32)
Creatinine, Ser: 0.79 mg/dL (ref 0.40–1.50)
GFR: 101.05 mL/min (ref >60.00)
Glucose, Bld: 125 mg/dL — ABNORMAL HIGH (ref 70–99)
POTASSIUM: 5.2 meq/L — AB (ref 3.5–5.1)
SODIUM: 140 meq/L (ref 135–145)
TOTAL PROTEIN: 7.1 g/dL (ref 6.0–8.3)
Total Bilirubin: 1.5 mg/dL — ABNORMAL HIGH (ref 0.2–1.2)

## 2017-08-16 LAB — TSH: TSH: 2.48 u[IU]/mL (ref 0.35–4.50)

## 2017-08-16 MED ORDER — NITROGLYCERIN 0.4 MG SL SUBL: 0.4000 mg | SUBLINGUAL_TABLET | SUBLINGUAL | 6 refills | Status: DC | PRN

## 2017-08-16 MED ORDER — APIXABAN 5 MG PO TABS: 5.0000 mg | ORAL_TABLET | Freq: Two times a day (BID) | ORAL | 4 refills | Status: DC

## 2017-08-16 NOTE — Progress Notes (Signed)
Pre visit review using our clinic review tool, if applicable. No additional management support is needed unless otherwise documented below in the visit note. 

## 2017-08-16 NOTE — Telephone Encounter (Signed)
Sent. Thanks.   

## 2017-08-16 NOTE — Telephone Encounter (Signed)
Dr. Damita Dunnings,  Patient was in office today for AWV and CPE labs. Patient requested refill for Nitroglycerin 0.4 mg tablet. Patient states he does not use them but has had them since 2016 when Dr. Angelena Form prescribed them. Patient has requested to transfer care to Dr. Rockey Situ and has appt with [facility] on 08/22/17.  Pharmacy: [facility]

## 2017-08-16 NOTE — Telephone Encounter (Signed)
Dr. Krista Blue,  Patient was in office today for Medicare Wellness Visit and CPE labs. Patient requested refill of Apixaban (Eliquis) 5 mg. Last order date was 04/11/17. Dispense: 60 tablets.   Future appt scheduled: 09/05/2017 @1530 .  Pharm[facility]: [facility]

## 2017-08-16 NOTE — Progress Notes (Signed)
PCP notes:   Health maintenance:  Eye exam - per pt report, exam in April 2018  Abnormal screenings:   Hearing - failed  Hearing Screening   125Hz  250Hz  500Hz  1000Hz  2000Hz  3000Hz  4000Hz  6000Hz  8000Hz   Right ear:   0 0 40  0    Left ear:   40 0 0  0     Patient concerns:   Medication management - refill requests sent to PCP and Dr. Krista Blue  Wound to right ankle - pt does not know how he injured ankle and states wound is slow healing  Respiratory concern - pt reports feeling shortness of breath when lying in bed in a resting state. Pt states this is a new concern since last appt.  Urinary incontinence - recent onset. Pt states he has noticed that his bladder is not completely voiding evidenced by post urination leakage  Nurse concerns:  BP is elevated. 164/90. Pt states his BP is consistently elevated.  He would like to discuss BP medication with PCP.  Next PCP appt:   1/18 @ 0945

## 2017-08-16 NOTE — Progress Notes (Signed)
Subjective:   [name] [name] is a 77 y.o. male who presents for Medicare Annual/Subsequent preventive examination.  Review of Systems:  N/A Cardiac Risk Factors include: advanced age (>55men, >37 women);male gender;dyslipidemia;diabetes mellitus     Objective:    Vitals: BP (!) 164/90 (BP Location: Right Arm, Patient Position: Sitting, Cuff Size: Normal)   Pulse 63   Temp 97.7 F (36.5 C) (Oral)   Ht 5\' 7"  (1.702 m) Comment: no shoes  Wt 163 lb 12 oz (74.3 kg)   SpO2 98%   BMI 25.65 kg/m   Body mass index is 25.65 kg/m.  Advanced Directives 08/16/2017 03/10/2017 01/19/2017 01/03/2017 07/26/2016 08/21/2013  Does Patient Have a Medical Advance Directive? No No No No Yes Patient does not have advance directive  Type of Advance Directive - - - - Press photographer;Living will -  Copy of [place] in Chart? - - - - No - copy requested -  Would patient like information on creating a medical advance directive? No - Patient declined - - No - Patient declined - -    Tobacco Social History   Tobacco Use  Smoking Status Never Smoker  Smokeless Tobacco Never Used     Counseling given: No   Clinical Intake:  Pre-visit preparation completed: Yes  Pain : 0-10 Pain Score: 4  Pain Type: Chronic pain Pain Location: Leg Pain Orientation: Left, Right Pain Onset: More than a month ago Pain Frequency: Constant     Nutritional Status: BMI 25 -29 Overweight Nutritional Risks: Non-healing wound(wound to right ankle) Diabetes: Yes CBG done?: No Did pt. bring in CBG monitor from home?: No  How often do you need to have someone help you when you read instructions, pamphlets, or other written materials from your doctor or pharmacy?: 1 - Never What is the last grade level you completed in school?: 12th grade     Comments: pt lives with spouse Information entered by :: LPinson, LPN  Past Medical History:  Diagnosis Date  . Anginal pain ([place])   . Anxiety     . Back pain   . Chest pain   . 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 Diabetes mellitus   . Diabetic neuropathy ([place])   . Diverticulosis   . Diverticulosis   . Erectile dysfunction   . Family history of anesthesia complication    ' they cant wake my brother very easy"  . Fatty liver   . Flank pain   . GERD (gastroesophageal reflux disease)   . Heart murmur   . History of bronchitis   . History of chicken pox   . 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 Hypercholesterolemia   . Hypertension   . Kidney stones   . Osteoarthritis    Past Surgical History:  Procedure Laterality Date  . CARDIAC CATHETERIZATION  08/21/2013   DR Angelena Form  . CHOLECYSTECTOMY  1983  . CORONARY ARTERY BYPASS GRAFT N/A 08/23/2013   Procedure: CORONARY ARTERY BYPASS GRAFTING (CABG);  Surgeon: Grace Isaac, MD;  Location: [facility];  Service: Open Heart Surgery;  Laterality: N/A;  CABG times six utilizing the left internal mammary artery and the right greater saphenous vein harvested endoscopically  . ENDOVEIN HARVEST OF GREATER SAPHENOUS VEIN Right 08/23/2013   Procedure: ENDOVEIN HARVEST OF GREATER SAPHENOUS VEIN;  Surgeon: Grace Isaac, MD;  Location: [facility];  Service: Open Heart Surgery;  Laterality: Right;  .  HERNIA REPAIR     X3  . INTRAOPERATIVE TRANSESOPHAGEAL ECHOCARDIOGRAM N/A 08/23/2013   Procedure: INTRAOPERATIVE TRANSESOPHAGEAL ECHOCARDIOGRAM;  Surgeon: Grace Isaac, MD;  Location: [facility];  Service: Open Heart Surgery;  Laterality: N/A;  . LEFT HEART CATHETERIZATION WITH CORONARY ANGIOGRAM N/A 08/21/2013   Procedure: LEFT HEART CATHETERIZATION WITH CORONARY ANGIOGRAM;  Surgeon: Burnell Blanks, MD;  Location: [facility] CATH LAB;  Service: Cardiovascular;  Laterality: N/A;   Family History  Problem Relation Age of Onset  . Stroke Mother   . Diabetes Mother   . Heart disease Father        heart failure  . Colon cancer Maternal Grandmother   .  Parkinsonism Maternal Grandfather   . Stroke Paternal Grandfather   . Prostate cancer Maternal Uncle   . CAD Brother        CABG   Social History   Socioeconomic History  . Marital status: Married    Spouse name: None  . Number of children: 2  . Years of education: HS  . Highest education level: None  Social Needs  . Financial resource strain: None  . Food insecurity - worry: None  . Food insecurity - inability: None  . Transportation needs - medical: None  . Transportation needs - non-medical: None  Occupational History  . Occupation: Retired  Tobacco Use  . Smoking status: Never Smoker  . Smokeless tobacco: Never Used  Substance and Sexual Activity  . Alcohol use: No    Alcohol/week: 0.0 oz  . Drug use: No  . Sexual activity: None  Other Topics Concern  . 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   Right-handed.   2 cups caffeine daily.    Outpatient Encounter Medications as of 08/16/2017  Medication Sig  . acyclovir (ZOVIRAX) 800 MG tablet Take 1 tablet (800 mg total) by mouth 2 (two) times daily.  Marland Kitchen apixaban (ELIQUIS) 5 MG TABS tablet Take 1 tablet (5 mg total) by mouth 2 (two) times daily.  . diclofenac sodium (VOLTAREN) 1 % GEL Apply 4 g topically 4 (four) times daily as needed.  . gabapentin (NEURONTIN) 600 MG tablet Take 1 tablet (600 mg total) by mouth 3 (three) times daily.  Marland Kitchen latanoprost (XALATAN) 0.005 % ophthalmic solution Place 1 drop into the right eye at bedtime.  Marland Kitchen losartan (COZAAR) 50 MG tablet Take 1 tablet (50 mg total) by mouth daily.  . metFORMIN (GLUCOPHAGE) 850 MG tablet Take 1 tablet (850 mg total) by mouth 2 (two) times daily with a meal.  . metoprolol tartrate (LOPRESSOR) 25 MG tablet Take 1 tablet (25 mg total) by mouth 2 (two) times daily.  . nitroGLYCERIN (NITROSTAT) 0.4 MG SL tablet Place 1 tablet (0.4 mg total)  under the tongue every 5 (five) minutes as needed for chest pain (max 3 doses).   No facility-administered encounter medications on file as of 08/16/2017.     Activities of Daily Living In your present state of health, do you have any difficulty performing the following activities: 08/16/2017  Hearing? N  Vision? N  Difficulty concentrating or making decisions? N  Walking or climbing stairs? Y  Comment SOB when walking long distances  Dressing or bathing? N  Doing errands, shopping? N  Preparing Food and eating ? N  Using the Toilet? N  In the past six months, have you  accidently leaked urine? Y  Do you have problems with loss of bowel control? N  Managing your Medications? N  Managing your Finances? N  Housekeeping or managing your Housekeeping? N  Some recent data might be hidden    Patient Care Team: Tonia Ghent, MD as PCP - General (Family Medicine) Burnell Blanks, MD as Consulting Physician (Cardiology) Clarene Critchley, MD as Consulting Physician (Ophthalmology) Sydnee Levans, MD as Consulting Physician (Dermatology)   Assessment:   This is a routine wellness examination for [place].   Hearing Screening   125Hz  250Hz  500Hz  1000Hz  2000Hz  3000Hz  4000Hz  6000Hz  8000Hz   Right ear:   0 0 40  0    Left ear:   40 0 0  0    Vision Screening Comments: Last vision exam in April 2018 with Dr. Cyndra Numbers    Exercise Activities and Dietary recommendations Current Exercise Habits: Home exercise routine, Type of exercise: strength training/weights;stretching;Other - see comments(elliptical, stationary bike), Time (Minutes): > 60, Frequency (Times/Week): 6, Weekly Exercise (Minutes/Week): 0, Intensity: Moderate, Exercise limited by: None identified  Goals    . Increase physical activity     Starting 08/16/2017, I will continue to exercise for 90 minutes 6 days per week.        Fall Risk Fall Risk  08/16/2017 07/26/2016 07/24/2015 07/14/2014 07/11/2013  Falls in the past  year? No No No No No   Depression Screen PHQ 2/9 Scores 08/16/2017 07/26/2016 07/24/2015 07/14/2014  PHQ - 2 Score 0 0 0 0  PHQ- 9 Score 0 - - -    Cognitive Function MMSE - Mini Mental State Exam 08/16/2017 07/26/2016  Orientation to time 5 5  Orientation to Place 5 5  Registration 3 3  Attention/ Calculation 0 0  Recall 3 3  Language- name 2 objects 0 0  Language- repeat 1 1  Language- follow 3 step command 3 3  Language- read & follow direction 0 0  Write a sentence 0 0  Copy design 0 0  Total score 20 20     PLEASE NOTE: A Mini-Cog screen was completed. Maximum score is 20. A value of 0 denotes this part of [place] MMSE was not completed or the patient failed this part of the Mini-Cog screening.   Mini-Cog Screening Orientation to Time - Max 5 pts Orientation to Place - Max 5 pts Registration - Max 3 pts Recall - Max 3 pts Language Repeat - Max 1 pts Language Follow 3 Step Command - Max 3 pts     Immunization History  Administered Date(s) Administered  . Influenza Split 06/11/2012, 06/08/2014  . Influenza,inj,Quad PF,6+ Mos 07/24/2015  . Influenza-Unspecified 05/08/2016, 05/19/2017  . Pneumococcal Conjugate-13 07/14/2014  . Pneumococcal Polysaccharide-23 06/11/2012  . Td 08/08/2008     Screening Tests Health Maintenance  Topic Date Due  . OPHTHALMOLOGY EXAM  11/06/2017  . HEMOGLOBIN A1C  12/17/2017  . FOOT EXAM  06/19/2018  . TETANUS/TDAP  08/08/2018  . INFLUENZA VACCINE  Completed  . PNA vac Low Risk Adult  Completed      Plan:     I have personally reviewed, addressed, and noted the following in the patient's chart:  A. Medical and social history B. Use of alcohol, tobacco or illicit drugs  C. Current medications and supplements D. Functional ability and status E.  Nutritional status F.  Physical activity G. Advance directives H. List of other physicians I.  Hospitalizations, surgeries, and ER visits in previous 12 months J.  Vitals K. Screenings to include hearing, vision, cognitive, depression L. Referrals and appointments - none  In addition, I have reviewed and discussed with patient certain preventive protocols, quality metrics, and best practice recommendations. A written personalized care plan for preventive services as well as general preventive health recommendations were provided to patient.  See attached scanned questionnaire for additional information.   Signed,   Lindell Noe, MHA, BS, LPN Health Coach

## 2017-08-16 NOTE — Patient Instructions (Signed)
[name] [name] , Thank you for taking time to come for your Medicare Wellness Visit. I appreciate your ongoing commitment to your health goals. Please review the following plan we discussed and let me know if I can assist you in the future.   These are the goals we discussed: Goals    . Increase physical activity     Starting 08/16/2017, I will continue to exercise for 90 minutes 6 days per week.        This is a list of the screening recommended for you and due dates:  Health Maintenance  Topic Date Due  . Eye exam for diabetics  11/06/2017  . Hemoglobin A1C  12/17/2017  . Complete foot exam   06/19/2018  . Tetanus Vaccine  08/08/2018  . Flu Shot  Completed  . Pneumonia vaccines  Completed   Preventive Care for Adults  A healthy lifestyle and preventive care can promote health and wellness. Preventive health guidelines for adults include the following key practices.  . A routine yearly physical is a good way to check with your health care provider about your health and preventive screening. It is a chance to share any concerns and updates on your health and to receive a thorough exam.  . Visit your dentist for a routine exam and preventive care every 6 months. Brush your teeth twice a day and floss once a day. Good oral hygiene prevents tooth decay and gum disease.  . The frequency of eye exams is based on your age, health, family medical history, use  of contact lenses, and other factors. Follow your health care provider's recommendations for frequency of eye exams.  . Eat a healthy diet. Foods like vegetables, fruits, whole grains, low-fat dairy products, and lean protein foods contain the nutrients you need without too many calories. Decrease your intake of foods high in solid fats, added sugars, and salt. Eat the right amount of calories for you. Get information about a proper diet from your health care provider, if necessary.  . Regular physical exercise is one of the most important  things you can do for your health. Most adults should get at least 150 minutes of moderate-intensity exercise (any activity that increases your heart rate and causes you to sweat) each week. In addition, most adults need muscle-strengthening exercises on 2 or more days a week.  Silver Sneakers may be a benefit available to you. To determine eligibility, you may visit the website: www.silversneakers.com or contact program at 313-627-1661 Mon-Fri between 8AM-8PM.   . Maintain a healthy weight. The body mass index (BMI) is a screening tool to identify possible weight problems. It provides an estimate of body fat based on height and weight. Your health care provider can find your BMI and can help you achieve or maintain a healthy weight.   For adults 20 years and older: ? A BMI below 18.5 is considered underweight. ? A BMI of 18.5 to 24.9 is normal. ? A BMI of 25 to 29.9 is considered overweight. ? A BMI of 30 and above is considered obese.   . Maintain normal blood lipids and cholesterol levels by exercising and minimizing your intake of saturated fat. Eat a balanced diet with plenty of fruit and vegetables. Blood tests for lipids and cholesterol should begin at age 73 and be repeated every 5 years. If your lipid or cholesterol levels are high, you are over 50, or you are at high risk for heart disease, you may need your cholesterol levels  checked more frequently. Ongoing high lipid and cholesterol levels should be treated with medicines if diet and exercise are not working.  . If you smoke, find out from your health care provider how to quit. If you do not use tobacco, please do not start.  . If you choose to drink alcohol, please do not consume more than 2 drinks per day. One drink is considered to be 12 ounces (355 mL) of beer, 5 ounces (148 mL) of wine, or 1.5 ounces (44 mL) of liquor.  . If you are 67-8 years old, ask your health care provider if you should take aspirin to prevent  strokes.  . Use sunscreen. Apply sunscreen liberally and repeatedly throughout the day. You should seek shade when your shadow is shorter than you. Protect yourself by wearing long sleeves, pants, a wide-brimmed hat, and sunglasses year round, whenever you are outdoors.  . Once a month, do a whole body skin exam, using a mirror to look at the skin on your back. Tell your health care provider of new moles, moles that have irregular borders, moles that are larger than a pencil eraser, or moles that have changed in shape or color.

## 2017-08-16 NOTE — Telephone Encounter (Signed)
I refilled Eliquis 5 mg twice a day, 180 tablets, with 3 refills

## 2017-08-16 NOTE — Progress Notes (Signed)
I reviewed health advisor's note, was available for consultation, and agree with documentation and plan.   Signed,  Ebany Bowermaster T. Aubry Rankin, MD  

## 2017-08-22 ENCOUNTER — Ambulatory Visit (INDEPENDENT_AMBULATORY_CARE_PROVIDER_SITE_OTHER): Payer: Medicare Other | Admitting: Nurse Practitioner

## 2017-08-22 ENCOUNTER — Encounter: Payer: Self-pay | Admitting: Nurse Practitioner

## 2017-08-22 ENCOUNTER — Telehealth: Payer: Self-pay | Admitting: Nurse Practitioner

## 2017-08-22 VITALS — BP 170/76 | HR 60 | Ht 67.0 in | Wt 165.8 lb

## 2017-08-22 DIAGNOSIS — I481 Persistent atrial fibrillation: Secondary | ICD-10-CM

## 2017-08-22 DIAGNOSIS — I4819 Other persistent atrial fibrillation: Secondary | ICD-10-CM

## 2017-08-22 DIAGNOSIS — E785 Hyperlipidemia, unspecified: Secondary | ICD-10-CM | POA: Diagnosis not present

## 2017-08-22 DIAGNOSIS — I25119 Atherosclerotic heart disease of native coronary artery with unspecified angina pectoris: Secondary | ICD-10-CM | POA: Diagnosis not present

## 2017-08-22 DIAGNOSIS — Q211 Atrial septal defect: Secondary | ICD-10-CM

## 2017-08-22 DIAGNOSIS — Q2112 Patent foramen ovale: Secondary | ICD-10-CM

## 2017-08-22 DIAGNOSIS — I10 Essential (primary) hypertension: Secondary | ICD-10-CM | POA: Diagnosis not present

## 2017-08-22 MED ORDER — LOSARTAN POTASSIUM 100 MG PO TABS: 100.0000 mg | ORAL_TABLET | Freq: Every day | ORAL | 6 refills | Status: DC

## 2017-08-22 NOTE — Telephone Encounter (Signed)
Patient calling to confirm he received message and will be there 08/24/17 Will call back if any questions arise

## 2017-08-22 NOTE — Progress Notes (Signed)
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  Past Medical History    Past Medical History:  Diagnosis Date  . Anxiety   . Back pain   . Coronary artery disease 08/21/2013   a. 08/2013 s/p CABG x 6.  . Diabetes mellitus   . Diabetic neuropathy ([name] [name])   . Diastolic dysfunction    a. 02/2017 Echo: EF 55-60%, no rwma, Gr1 DD, mildly dil Ao Root/RV. + PFO.  . Diverticulosis   . Diverticulosis   . Embolic stroke ([name] [name])    a. 04/2017 MRI in setting of freq falls: small subacute infarction of bilat centrum semi-ovale consistent w/ embolic event-->Eliquis.  . Erectile dysfunction   . Family history of anesthesia complication    ' they cant wake my brother very easy"  . Fatty liver   . Flank pain   . GERD (gastroesophageal reflux disease)   . History of bronchitis   . History of chicken pox   . 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 Hypercholesterolemia   . Hypertension   . Kidney stones   . Osteoarthritis   . Persistent atrial fibrillation ([name] [name])    a. Had post-op AF 08/2013; b. 09/2015 Holter: PAC's no AF; c. 05/2017 Noted to be in AFib-->Eliquis (CHA2DS2VASc = 7).  Marland Kitchen PFO (patent foramen ovale)    a. 02/2017 Echo: + PFO.   Past Surgical History:  Procedure Laterality Date  . CARDIAC CATHETERIZATION  08/21/2013   DR [name] [name]  . CHOLECYSTECTOMY  1983  . CORONARY ARTERY BYPASS GRAFT N/A 08/23/2013   Procedure: CORONARY ARTERY BYPASS GRAFTING (CABG);  Surgeon: [name] Isaac, MD;  Location: [facility];  Service: Open Heart Surgery;  Laterality: N/A;  CABG times six utilizing the  left internal mammary artery and the right greater saphenous vein harvested endoscopically  . ENDOVEIN HARVEST OF GREATER SAPHENOUS VEIN Right 08/23/2013   Procedure: ENDOVEIN HARVEST OF GREATER SAPHENOUS VEIN;  Surgeon: [name] Isaac, MD;  Location: [facility];  Service: Open Heart Surgery;  Laterality: Right;  . HERNIA REPAIR     X3  . INTRAOPERATIVE TRANSESOPHAGEAL ECHOCARDIOGRAM N/A 08/23/2013   Procedure: INTRAOPERATIVE TRANSESOPHAGEAL ECHOCARDIOGRAM;  Surgeon: [name] Isaac, MD;  Location: [facility];  Service: Open Heart Surgery;  Laterality: N/A;  . LEFT HEART CATHETERIZATION WITH CORONARY ANGIOGRAM N/A 08/21/2013   Procedure: LEFT HEART CATHETERIZATION WITH CORONARY ANGIOGRAM;  Surgeon: [name] Blanks, MD;  Location: [facility] CATH LAB;  Service: Cardiovascular;  Laterality: N/A;    Allergies  Allergies  Allergen Reactions  . Fenofibrate Other (See Comments)    Muscle weakness.    . Statins Other (See Comments)    Other Reaction: MYALGIA Other reaction(s): Other (See Comments) Other Reaction: MYALGIA Muscle pain    History of Present Illness    77 year old ? with the above complex PMH including CAD s/p CABG x 6 in 08/2013, HTN, HL, DMII, and persistent atrial fibrillation.  He did have postoperative atrial fibrillation following his bypass surgery in 2015 but this apparently resolved with a short course of amiodarone and he was also on Coumadin briefly.  He  had palpitations in early 2017 and Holter monitor at that time showed sinus rhythm with PACs.  In the spring 2018, he was evaluated by neurology in the setting of recurrent falls.  MRI in September 2018 showed small subacute infarction of the bilateral centrum semi-ovale consistent with a prior embolic event.  It was felt that this was most likely explained by paroxysmal atrial fibrillation and he was placed on Eliquis 5 mg twice daily.  He followed up in cardiology clinic in early October at which time, he was actually noted to be  in atrial fibrillation, which was rate controlled.  Echocardiogram from July 2018 was reviewed and was notable for normal LV function with grade 1 diastolic dysfunction and a patent foramen ovale.  Patient was asymptomatic and after discussion with [name] [name], it was felt that he might benefit from initiation of Tikosyn therapy.  [name] [name] preferred to think about it and instead remained on metoprolol and Eliquis.  He was referred to A. fib clinic and was seen there on May 25, 2017.  Again, he was provided information with regards to antiarrhythmic therapy, and specifically tikosyn he also question the role of cardioversion.  Overall however, he wished to defer a decision.  Since his visit in October with A. fib clinic, he has decided he would prefer to stay closer to home and follow-up here in [place].  He has done relatively well since his last visit.  He does exercise 6 days a week at the [facility].  Every once in a while he will have an episode of dyspnea which he says is sort of out of the blue and not necessarily associated with exertion.  He has to take 3 or 4 good deep breaths and then this resolves.  He does not typically experience chest pain but this morning while using the elliptical, he had some sharp/stinging chest discomfort moving towards his left shoulder.  This improved after about 5 or 10 minutes with rest.  It did recur with another exercise, and again improved with rest.  This was completely different than prior angina which was more like substernal chest heaviness and profound dyspnea.  He did not have any associated symptoms today.  From the standpoint of A. fib, he is asymptomatic and denies any fatigue or palpitations.  He apparently used to have occasional tachypalpitations but these have been stable on metoprolol therapy.  His heart rate typically runs in the 50s to low 60s at home.  He believes he is in A. fib all the time because he will check his pulse frequently and note the  irregularity.  We had a long discussion about possible management of atrial fibrillation and he is interested in cardioversion.  Home Medications    Prior to Admission medications   Medication Sig Start Date End Date Taking? Authorizing Provider  acyclovir (ZOVIRAX) 800 MG tablet Take 1 tablet (800 mg total) by mouth 2 (two) times daily. 08/02/16  Yes [name] Ghent, MD  apixaban (ELIQUIS) 5 MG TABS tablet Take 1 tablet (5 mg total) by mouth 2 (two) times daily. 08/16/17  Yes Marcial Pacas, MD  diclofenac sodium (VOLTAREN) 1 % GEL Apply 4 g topically 4 (four) times daily as needed. 08/02/16  Yes [name] Ghent, MD  gabapentin (NEURONTIN) 600 MG tablet Take 1 tablet (600 mg total) by mouth 3 (three) times daily. 08/02/16  Yes [name] Ghent, MD  latanoprost (XALATAN) 0.005 % ophthalmic solution Place 1 drop into the right eye at bedtime.  07/21/17  Yes [name] Ghent, MD  losartan (COZAAR) 50 MG tablet Take 1 tablet (50 mg total) by mouth daily. 08/02/16  Yes [name] Ghent, MD  metFORMIN (GLUCOPHAGE) 850 MG tablet Take 1 tablet (850 mg total) by mouth 2 (two) times daily with a meal. 06/02/17  Yes [name] Ghent, MD  metoprolol tartrate (LOPRESSOR) 25 MG tablet Take 1 tablet (25 mg total) by mouth 2 (two) times daily. 02/09/17  Yes [name] Ghent, MD  nitroGLYCERIN (NITROSTAT) 0.4 MG SL tablet Place 1 tablet (0.4 mg total) under the tongue every 5 (five) minutes as needed for chest pain (max 3 doses). 08/16/17  Yes [name] Ghent, MD    Review of Systems    Occasional, brief episodes of dyspnea which she describes as having to take 3-4 good deep breaths prior to resolution.  He does not experience the symptoms with exercise and is active, exercising 6 days a week.  He did have some sharp and stinging chest pain this morning with exercise.  He denies palpitations, PND, orthopnea, dizziness, syncope, edema, or early satiety.  All other systems reviewed and are otherwise negative except as  noted above.  Physical Exam    VS:  BP (!) 170/76 (BP Location: Left Arm, Patient Position: Sitting, Cuff Size: Normal)   Pulse 60   Ht 5\' 7"  (1.702 m)   Wt 165 lb 12 oz (75.2 kg)   BMI 25.96 kg/m  , BMI Body mass index is 25.96 kg/m. GEN: Well nourished, well developed, in no acute distress.  HEENT: normal.  Neck: Supple, no JVD, carotid bruits, or masses. Cardiac: IR, IR, no murmurs, rubs, or gallops. No clubbing, cyanosis, edema.  Radials/DP/PT 2+ and equal bilaterally.  Respiratory:  Respirations regular and unlabored, clear to auscultation bilaterally. GI: Soft, nontender, nondistended, BS + x 4. MS: no deformity or atrophy. Skin: warm and dry, no rash. Neuro:  Strength and sensation are intact. Psych: Normal affect.  Accessory Clinical Findings    ECG - atrial fibrillation, 60, septal T wave inversion.  Assessment & Plan    1.  Persistent AFib: Patient has a history of postoperative atrial fibrillation which was noted following his bypass surgery in January 2015.  He did have palpitations and underwent monitoring in February 2017, though that only showed PACs.  In the fall 2018, he was evaluated by neurology for frequent falls and was noted on MRI to have a subacute embolic stroke.  It was felt that this was secondary to atrial fibrillation and he was placed on Eliquis and referred back to cardiology.  He was in A. fib when seen on October 8, and again on October 18.  He remains in atrial fibrillation today and checks his pulse on a regular basis and has noted irregularity daily.  He is well rate controlled at a rate of 60 on low-dose metoprolol therapy.  CHA2DS2VASc equals 7 and he is on Eliquis.  He reports  good compliance.  He is interested in pursuing cardioversion.  We did have a long discussion regarding management of atrial fibrillation to potentially include antiarrhythmics.  He says he is not a medicine person and would prefer to avoid additional medications if at all  possible, especially one that might require hospitalization for initiation like Tikosyn.  He would like to try cardioversion first and I will schedule this with Dr. Rockey Situ potentially later this week.  He is advised to hold his beta-blocker the morning of cardioversion and be sure to  take his Eliquis.  We discussed that if cardioversion is unsuccessful, and he continued to have interest in rhythm management, we would likely again pursue initiation of tikosyn.  2.  CAD: s/p CABG in January 2015.  He exercises 6 days a week and generally does well though he did have sharp and stinging pain while on the elliptical this morning.  This was different than prior angina.  We discussed options for evaluation and collectively agreed that we can plan to manage his A. fib first and see if symptoms recur.  I will plan to see him back in a few weeks following his cardioversion to readdress symptoms and likely arrange for exercise stress testing.  He remains on beta-blocker and ARB therapy.  He is not on a statin secondary to prior intolerance.  3.  Hyperlipidemia: LDL was 62 in October 2018.  He is not on a statin.  4.  Essential hypertension: Blood pressure elevated today.  Will increase losartan to 100 mg daily.  Plan follow-up basic metabolic panel in 1-2 weeks.    5.  Patent foramen ovale: Patient is now on lifelong oral anticoagulation in the setting of persistent atrial fibrillation, which is the most likely culprit for his subacute stroke noted on MRI.  In that setting, and with age greater than 30, no current role for PFO closure.  6.  Disposition: We will arrange for cardioversion.  Plan to follow-up in about 2 weeks.  Murray Hodgkins, NP 08/22/2017, 10:05 AM

## 2017-08-22 NOTE — Patient Instructions (Addendum)
Medication Instructions: - Your physician has recommended you make the following change in your medication:  1) INCREASE Losartan 100 mg- take 1 tablet by mouth once daily  Labwork: - none ordered  Procedures/Testing: - Your physician has recommended that you have a Cardioversion (DCCV). Electrical Cardioversion uses a jolt of electricity to your heart either through paddles or wired patches attached to your chest. This is a controlled, usually prescheduled, procedure. Defibrillation is done under light anesthesia in the hospital, and you usually go home the day of the procedure. This is done to get your heart back into a normal rhythm. You are not awake for the procedure. Please see the instruction sheet given to you today.  You are scheduled for a Cardioversion on ________________ with Dr.___________ Please arrive at the [facility] at _________ a.m. on the day of your procedure.  DIET INSTRUCTIONS:  Nothing to eat or drink after midnight except your medications with a              sip of water.         1) Labs: ___not needed_______________  2) Medications:  You may take all of your regular medications the morning of your procedure with enough water to get them down safely EXCEPT- hold metoprolol & metformin the morning of the procedure  3) Must have a responsible person to drive you home.  4) Bring a current list of your medications and current insurance cards.    If you have any questions after you get home, please call the office at [phone]    Follow-Up: - Your physician recommends that you schedule a follow-up appointment in: 1-2 weeks after Cardiovesion with Dr. Arley Phenix, NP   Any Additional Special Instructions Will Be Listed Below (If Applicable).     If you need a refill on your cardiac medications before your next appointment, please call your pharmacy.

## 2017-08-22 NOTE — Telephone Encounter (Signed)
I left a message for the patient on his home # that his DCCV is scheduled for Thursday 08/24/17 at 9:00 am- he will need to arrive at 8:00 am- I asked that he/ his wife call back to confirm.

## 2017-08-24 ENCOUNTER — Ambulatory Visit
Admission: RE | Admit: 2017-08-24 | Discharge: 2017-08-24 | Disposition: A | Discharge status: Home / Self Care | Payer: Medicare Other | Source: Ambulatory Visit | Attending: Cardiovascular Disease | Admitting: Cardiovascular Disease

## 2017-08-24 ENCOUNTER — Ambulatory Visit: Payer: Medicare Other | Admitting: Anesthesiology

## 2017-08-24 ENCOUNTER — Encounter
Admission: RE | Disposition: A | Discharge status: Home / Self Care | Payer: Self-pay | Source: Ambulatory Visit | Attending: Cardiovascular Disease

## 2017-08-24 ENCOUNTER — Other Ambulatory Visit: Payer: Self-pay | Admitting: *Deleted

## 2017-08-24 DIAGNOSIS — R0602 Shortness of breath: Secondary | ICD-10-CM | POA: Diagnosis not present

## 2017-08-24 DIAGNOSIS — Z7984 Long term (current) use of oral hypoglycemic drugs: Secondary | ICD-10-CM | POA: Insufficient documentation

## 2017-08-24 DIAGNOSIS — Q211 Atrial septal defect: Secondary | ICD-10-CM | POA: Insufficient documentation

## 2017-08-24 DIAGNOSIS — E785 Hyperlipidemia, unspecified: Secondary | ICD-10-CM | POA: Diagnosis not present

## 2017-08-24 DIAGNOSIS — Z8673 Personal history of transient ischemic attack (TIA), and cerebral infarction without residual deficits: Secondary | ICD-10-CM | POA: Insufficient documentation

## 2017-08-24 DIAGNOSIS — E1140 Type 2 diabetes mellitus with diabetic neuropathy, unspecified: Secondary | ICD-10-CM | POA: Insufficient documentation

## 2017-08-24 DIAGNOSIS — Z7901 Long term (current) use of anticoagulants: Secondary | ICD-10-CM | POA: Insufficient documentation

## 2017-08-24 DIAGNOSIS — Z951 Presence of aortocoronary bypass graft: Secondary | ICD-10-CM | POA: Diagnosis not present

## 2017-08-24 DIAGNOSIS — Z791 Long term (current) use of non-steroidal anti-inflammatories (NSAID): Secondary | ICD-10-CM | POA: Insufficient documentation

## 2017-08-24 DIAGNOSIS — I481 Persistent atrial fibrillation: Secondary | ICD-10-CM | POA: Insufficient documentation

## 2017-08-24 DIAGNOSIS — R001 Bradycardia, unspecified: Secondary | ICD-10-CM | POA: Diagnosis not present

## 2017-08-24 DIAGNOSIS — Z79899 Other long term (current) drug therapy: Secondary | ICD-10-CM | POA: Insufficient documentation

## 2017-08-24 DIAGNOSIS — I10 Essential (primary) hypertension: Secondary | ICD-10-CM | POA: Diagnosis not present

## 2017-08-24 DIAGNOSIS — I4891 Unspecified atrial fibrillation: Secondary | ICD-10-CM | POA: Diagnosis not present

## 2017-08-24 DIAGNOSIS — I4819 Other persistent atrial fibrillation: Secondary | ICD-10-CM

## 2017-08-24 DIAGNOSIS — I2510 Atherosclerotic heart disease of native coronary artery without angina pectoris: Secondary | ICD-10-CM | POA: Diagnosis not present

## 2017-08-24 DIAGNOSIS — E7800 Pure hypercholesterolemia, unspecified: Secondary | ICD-10-CM | POA: Diagnosis not present

## 2017-08-24 DIAGNOSIS — E1151 Type 2 diabetes mellitus with diabetic peripheral angiopathy without gangrene: Secondary | ICD-10-CM | POA: Diagnosis not present

## 2017-08-24 HISTORY — PX: CARDIOVERSION: EP1203

## 2017-08-24 LAB — GLUCOSE, CAPILLARY: GLUCOSE-CAPILLARY: 150 mg/dL — AB (ref 65–99)

## 2017-08-24 SURGERY — CARDIOVERSION (CATH LAB)
Anesthesia: General

## 2017-08-24 MED ORDER — SODIUM CHLORIDE 0.9 % IV SOLN
250.0000 mL | INTRAVENOUS | Status: DC
  Administered 2017-08-24: 1000 mL via INTRAVENOUS

## 2017-08-24 MED ORDER — SODIUM CHLORIDE 0.9% FLUSH: 3.0000 mL | Freq: Two times a day (BID) | INTRAVENOUS | Status: DC

## 2017-08-24 MED ORDER — PROPOFOL 10 MG/ML IV BOLUS
INTRAVENOUS | Status: DC | PRN
  Administered 2017-08-24: 70 mg via INTRAVENOUS

## 2017-08-24 MED ORDER — PROPOFOL 10 MG/ML IV BOLUS
INTRAVENOUS | Status: AC
  Filled 2017-08-24: qty 20

## 2017-08-24 MED ORDER — SODIUM CHLORIDE 0.9% FLUSH: 3.0000 mL | INTRAVENOUS | Status: DC | PRN

## 2017-08-24 NOTE — Anesthesia Postprocedure Evaluation (Signed)
Anesthesia Post Note  Patient: [name] [name]  Procedure(s) Performed: CARDIOVERSION (N/A )  Patient location during evaluation: PACU Anesthesia Type: General Level of consciousness: awake and alert Pain management: pain level controlled Vital Signs Assessment: post-procedure vital signs reviewed and stable Respiratory status: spontaneous breathing, nonlabored ventilation, respiratory function stable and patient connected to nasal cannula oxygen Cardiovascular status: blood pressure returned to baseline and stable Postop Assessment: no apparent nausea or vomiting Anesthetic complications: no     Last Vitals:  Vitals:   08/24/17 1000 08/24/17 1015  BP: (!) 139/106 (!) 163/75  Pulse:    Resp: 15 (!) 22  Temp:    SpO2:      Last Pain:  Vitals:   08/24/17 1015  TempSrc:   PainSc: 0-No pain                 Martha Clan

## 2017-08-24 NOTE — Discharge Instructions (Signed)
Electrical Cardioversion °Electrical cardioversion is the delivery of a jolt of electricity to restore a normal rhythm to the heart. A rhythm that is too fast or is not regular keeps the heart from pumping well. In this procedure, sticky patches or metal paddles are placed on the chest to deliver electricity to the heart from a device. °This procedure may be done in an emergency if: °· There is low or no blood pressure as a result of the heart rhythm. °· Normal rhythm must be restored as fast as possible to protect the brain and heart from further damage. °· It may save a life. ° °This procedure may also be done for irregular or fast heart rhythms that are not immediately life-threatening. °Tell a health care provider about: °· Any allergies you have. °· All medicines you are taking, including vitamins, herbs, eye drops, creams, and over-the-counter medicines. °· Any problems you or family members have had with anesthetic medicines. °· Any blood disorders you have. °· Any surgeries you have had. °· Any medical conditions you have. °· Whether you are pregnant or may be pregnant. °What are the risks? °Generally, this is a safe procedure. However, problems may occur, including: °· Allergic reactions to medicines. °· A blood clot that breaks free and travels to other parts of your body. °· The possible return of an abnormal heart rhythm within hours or days after the procedure. °· Your heart stopping (cardiac arrest ). This is rare. ° °What happens before the procedure? °Medicines °· Your health care provider may have you start taking: °? Blood-thinning medicines (anticoagulants) so your blood does not clot as easily. °? Medicines may be given to help stabilize your heart rate and rhythm. °· Ask your health care provider about changing or stopping your regular medicines. This is especially important if you are taking diabetes medicines or blood thinners. °General instructions °· Plan to have someone take you home from  the hospital or clinic. °· If you will be going home right after the procedure, plan to have someone with you for 24 hours. °· Follow instructions from your health care provider about eating or drinking restrictions. °What happens during the procedure? °· To lower your risk of infection: °? Your health care team will wash or sanitize their hands. °? Your skin will be washed with soap. °· An IV tube will be inserted into one of your veins. °· You will be given a medicine to help you relax (sedative). °· Sticky patches (electrodes) or metal paddles may be placed on your chest. °· An electrical shock will be delivered. °The procedure may vary among health care providers and hospitals. °What happens after the procedure? °· Your blood pressure, heart rate, breathing rate, and blood oxygen level will be monitored until the medicines you were given have worn off. °· Do not drive for 24 hours if you were given a sedative. °· Your heart rhythm will be watched to make sure it does not change. °This information is not intended to replace advice given to you by your health care provider. Make sure you discuss any questions you have with your health care provider. °Document Released: 07/15/2002 Document Revised: 03/23/2016 Document Reviewed: 01/29/2016 °Elsevier Interactive Patient Education © 2017 [employer] ° °

## 2017-08-24 NOTE — CV Procedure (Signed)
Cardioversion procedure note For atrial fibrillation, persistent.  Procedure Details:  Consent: Risks of procedure as well as the alternatives and risks of each were explained to the (patient/caregiver). Consent for procedure obtained.  Time Out: Verified patient identification, verified procedure, site/side was marked, verified correct patient position, special equipment/implants available, medications/allergies/relevent history reviewed, required imaging and test results available. Performed  Patient placed on cardiac monitor, pulse oximetry, supplemental oxygen as necessary.  Sedation given: propofol IV, Dr. Rosey Bath Pacer pads placed anterior and posterior chest.   Cardioverted 1 time(s).  Cardioverted at 120 J. Synchronized biphasic Converted to NSR   Evaluation: Findings: Post procedure EKG shows: NSR (sinus bradycardia rate 40 to 16O) Complications: None Patient did tolerate procedure well.  Time Spent Directly with the Patient:  75 minutes   Esmond Plants, M.D., Ph.D.

## 2017-08-24 NOTE — Transfer of Care (Signed)
Immediate Anesthesia Transfer of Care Note  Patient: [name] [name]  Procedure(s) Performed: CARDIOVERSION (N/A )  Patient Location: PACU and Nursing Unit  Anesthesia Type:General  Level of Consciousness: drowsy and patient cooperative  Airway & Oxygen Therapy: Patient Spontanous Breathing and Patient connected to nasal cannula oxygen  Post-op Assessment: Report given to RN and Post -op Vital signs reviewed and stable  Post vital signs: Reviewed and stable  Last Vitals:  Vitals:   08/24/17 0925 08/24/17 0926  BP:  (!) 144/88  Pulse: (!) 59 (!) 49  Resp: 17 20  Temp:    SpO2: (!) 89% 96%    Last Pain:  Vitals:   08/24/17 0810  TempSrc: Oral  PainSc: 0-No pain         Complications: No apparent anesthesia complications

## 2017-08-24 NOTE — Telephone Encounter (Addendum)
Faxed refill request. Latanoprost Eye Drops  Requests 90 day supply. Last office visit:   03/22/17   CPE scheduled 08/25/17 Last Filled:     2.5 mL 0 07/21/2017  Please advise.

## 2017-08-24 NOTE — Anesthesia Preprocedure Evaluation (Signed)
Anesthesia Evaluation  Patient identified by MRN, date of birth, ID band Patient awake    Reviewed: Allergy & Precautions, H&P , NPO status , Patient's Chart, lab work & pertinent test results, reviewed documented beta blocker date and time   History of Anesthesia Complications Negative for: history of anesthetic complications  Airway Mallampati: I  TM Distance: >3 FB Neck ROM: full    Dental  (+) Upper Dentures, Lower Dentures, Dental Advidsory Given   Pulmonary neg pulmonary ROS,           Cardiovascular Exercise Tolerance: Good hypertension, (-) angina+ CAD, + CABG (s/p 6v in 2015) and + Peripheral Vascular Disease  (-) Past MI and (-) Cardiac Stents + dysrhythmias Atrial Fibrillation + Valvular Problems/Murmurs (PFO)      Neuro/Psych neg Seizures  Neuromuscular disease CVA, Residual Symptoms negative psych ROS   GI/Hepatic GERD  ,NAFLD   Endo/Other  diabetes  Renal/GU Renal disease (kidney stones)  negative genitourinary   Musculoskeletal   Abdominal   Peds  Hematology negative hematology ROS (+)   Anesthesia Other Findings Past Medical History: No date: Anxiety No date: Back pain 08/21/2013: Coronary artery disease     Comment:  a. 08/2013 s/p CABG x 6. No date: Diabetes mellitus No date: Diabetic neuropathy (HCC) No date: Diastolic dysfunction     Comment:  a. 02/2017 Echo: EF 55-60%, no rwma, Gr1 DD, mildly dil               Ao Root/RV. + PFO. No date: Diverticulosis No date: Diverticulosis No date: Embolic stroke [facility])     Comment:  a. 04/2017 MRI in setting of freq falls: small subacute               infarction of bilat centrum semi-ovale consistent w/               embolic event-->Eliquis. No date: Erectile dysfunction No date: Family history of anesthesia complication     Comment:  ' they cant wake my brother very easy" No date: Fatty liver No date: Flank pain No date: GERD (gastroesophageal  reflux disease) No date: History of bronchitis No date: History of chicken pox No date: HSV infection     Comment:  ocular symptoms and oral lesions No date: Hypercholesterolemia No date: Hypertension No date: Kidney stones No date: Osteoarthritis No date: Persistent atrial fibrillation ([place])     Comment:  a. Had post-op AF 08/2013; b. 09/2015 Holter: PAC's no AF;              c. 05/2017 Noted to be in AFib-->Eliquis (CHA2DS2VASc =               7). No date: PFO (patent foramen ovale)     Comment:  a. 02/2017 Echo: + PFO.   Reproductive/Obstetrics negative OB ROS                             Anesthesia Physical Anesthesia Plan  ASA: III  Anesthesia Plan: General   Post-op Pain Management:    Induction: Intravenous  PONV Risk Score and Plan: 2 and TIVA  Airway Management Planned: Nasal Cannula  Additional Equipment:   Intra-op Plan:   Post-operative Plan:   Informed Consent: I have reviewed the patients History and Physical, chart, labs and discussed the procedure including the risks, benefits and alternatives for the proposed anesthesia with the patient or authorized representative who has indicated his/her understanding  and acceptance.   Dental Advisory Given  Plan Discussed with: Anesthesiologist, CRNA and Surgeon  Anesthesia Plan Comments:         Anesthesia Quick Evaluation

## 2017-08-24 NOTE — Anesthesia Post-op Follow-up Note (Signed)
Anesthesia QCDR form completed.        

## 2017-08-25 ENCOUNTER — Encounter: Payer: Self-pay | Admitting: Family Medicine

## 2017-08-25 ENCOUNTER — Ambulatory Visit (INDEPENDENT_AMBULATORY_CARE_PROVIDER_SITE_OTHER): Payer: Medicare Other | Admitting: Family Medicine

## 2017-08-25 VITALS — BP 150/80 | HR 63 | Temp 97.7°F | Ht 67.0 in | Wt 163.8 lb

## 2017-08-25 DIAGNOSIS — H9190 Unspecified hearing loss, unspecified ear: Secondary | ICD-10-CM

## 2017-08-25 DIAGNOSIS — E1149 Type 2 diabetes mellitus with other diabetic neurological complication: Secondary | ICD-10-CM | POA: Diagnosis not present

## 2017-08-25 DIAGNOSIS — I10 Essential (primary) hypertension: Secondary | ICD-10-CM

## 2017-08-25 DIAGNOSIS — I481 Persistent atrial fibrillation: Secondary | ICD-10-CM | POA: Diagnosis not present

## 2017-08-25 DIAGNOSIS — H409 Unspecified glaucoma: Secondary | ICD-10-CM | POA: Diagnosis not present

## 2017-08-25 DIAGNOSIS — Z7189 Other specified counseling: Secondary | ICD-10-CM

## 2017-08-25 DIAGNOSIS — I4819 Other persistent atrial fibrillation: Secondary | ICD-10-CM

## 2017-08-25 DIAGNOSIS — Z125 Encounter for screening for malignant neoplasm of prostate: Secondary | ICD-10-CM

## 2017-08-25 MED ORDER — LATANOPROST 0.005 % OP SOLN: 1.0000 [drp] | Freq: Every day | OPHTHALMIC | 0 refills | Status: DC

## 2017-08-25 NOTE — Telephone Encounter (Signed)
This needs to go to the eye clinic if he has seen them for follow up.  If he hasn't seen them yet, then he at least needs to be scheduled with them and I can fill in the interval.  Let me know.  Thanks.

## 2017-08-25 NOTE — Progress Notes (Signed)
He has f/u with the eye clinic pending.  I filled his eye drops in the meantime, to allow him to follow up.  D/w pt.    Hearing - failed.  D/w pt.  He wants hearing aids.  Referred.   Prostate cancer screening and PSA options (with potential risks and benefits of testing vs not testing) were discussed along with recent recs/guidelines.  He wanted testing PSA at this point, to be done with the next set of labs.   Advance directive- d/w pt- wife or daughter equally designated if patient were incapacitated.   Wound to right ankle - pt does not know how he injured ankle and states wound is slowly healing.  Now only with 66mm scab on the R lateral mal but appears to be healing.    Pt reports feeling shortness of breath when lying in bed in a resting state. Pt stated this was resolved since he was back in rhythm per cards after cardioversion.  Still anticoagulated.    Urinary leakage.  Incomplete voiding but stream is still okay.  No leaking prior to urination but a few drops after voiding.  He is putting with sx as is.  He didn't want further intervention.    Diabetes:  Using medications without difficulties:yes Hypoglycemic episodes: no Hyperglycemic episodes: no Feet problems: still with tingling in the feet, less pain than prev.   Blood Sugars averaging: not checked often eye exam within last year: pending.    Hypertension:    Using medication without problems or lightheadedness: yes Chest pain with exertion:no Edema:no Short of breath:no Statin intolerant.    PMH and SH reviewed  ROS: Per HPI unless specifically indicated in ROS section   Meds, vitals, and allergies reviewed.   GEN: nad, alert and oriented HEENT: mucous membranes moist NECK: supple w/o LA CV: rrr.  no murmur PULM: ctab, no inc wob ABD: soft, +bs EXT: no edema SKIN: no acute rash Prostate gland firm and smooth, no enlargement, nodularity, tenderness, mass, asymmetry or induration.  Diabetic foot  exam: Normal inspection except for healing lesion on R ankle (43mm scab on the R lateral mal but appears to be healing) No skin breakdown No calluses  Normal DP pulses Normal sensation to light touch and monofilament Nails normal

## 2017-08-25 NOTE — Patient Instructions (Addendum)
[name] [name] will call about your referral. Recheck labs before a visit in about 2-3 months (A1c and PSA). Don't change your meds for now.  I'll await the notes from the eye clinic.  Check your BP a few times in the next 2-3 weeks.  If consistently >140/>90, then let me know.  Take care.  Glad to see you.

## 2017-08-25 NOTE — Telephone Encounter (Signed)
See OV note.  D/w pt at OV.

## 2017-08-27 DIAGNOSIS — H9190 Unspecified hearing loss, unspecified ear: Secondary | ICD-10-CM | POA: Insufficient documentation

## 2017-08-27 NOTE — Assessment & Plan Note (Signed)
Recheck blood pressure improved but still elevated.  He can monitor blood pressure out of clinic and update me if persistently elevated.  No change in meds today.  He agrees.

## 2017-08-27 NOTE — Assessment & Plan Note (Signed)
Previous A1c controlled.  No change in meds.  Continue work on diet and exercise.  Recheck in a few months.  He agrees.  Previous labs discussed with patient. >25 minutes spent in face to face time with patient, >50% spent in counselling or coordination of care, discussing care, diet, exercise, previous labs, etc.

## 2017-08-27 NOTE — Assessment & Plan Note (Signed)
DRE unremarkable. Prostate cancer screening and PSA options (with potential risks and benefits of testing vs not testing) were discussed along with recent recs/guidelines.  He wanted testing PSA at this point, to be done with the next set of labs.  Ordered.

## 2017-08-27 NOTE — Assessment & Plan Note (Signed)
Refer

## 2017-08-27 NOTE — Assessment & Plan Note (Signed)
Advance directive- d/w pt- wife or daughter equally designated if patient were incapacitated.

## 2017-08-27 NOTE — Assessment & Plan Note (Signed)
Status post cardioversion.  Sounds to be in regular rhythm.  He feels better in general.  Continue as is.  I appreciate the help of all involved.

## 2017-08-27 NOTE — Assessment & Plan Note (Signed)
See above

## 2017-08-28 ENCOUNTER — Other Ambulatory Visit: Payer: Self-pay | Admitting: *Deleted

## 2017-08-28 NOTE — Telephone Encounter (Signed)
Faxed refill request. Latanoprost 0.005% Eye Drops    Patient requests 90 DAY SUPPLY Last office visit:   08/25/17 Last Filled:    2.5 mL 0 08/25/2017  Please advise.

## 2017-08-28 NOTE — H&P (Signed)
H&P Addendum, pre-cardioversion for persistent atrial fibrillation  Patient was seen and evaluated prior to -cardioversion procedure Symptoms, prior testing details again confirmed with the patient Patient examined, no significant change from prior exam Lab work reviewed in detail personally by myself Patient understands risk and benefit of the procedure, willing to proceed  Signed, Esmond Plants, MD, Ph.D [facility]

## 2017-08-29 MED ORDER — LATANOPROST 0.005 % OP SOLN: 1.0000 [drp] | Freq: Every day | OPHTHALMIC | 0 refills | Status: DC

## 2017-08-29 NOTE — Telephone Encounter (Signed)
Sent. Thanks.   

## 2017-08-30 ENCOUNTER — Ambulatory Visit: Payer: PPO | Admitting: Neurology

## 2017-09-01 DIAGNOSIS — L570 Actinic keratosis: Secondary | ICD-10-CM | POA: Diagnosis not present

## 2017-09-04 ENCOUNTER — Ambulatory Visit: Payer: Medicare Other | Admitting: Nurse Practitioner

## 2017-09-04 ENCOUNTER — Encounter: Payer: Self-pay | Admitting: Nurse Practitioner

## 2017-09-04 VITALS — BP 160/96 | HR 48 | Ht 67.0 in | Wt 164.5 lb

## 2017-09-04 DIAGNOSIS — I2510 Atherosclerotic heart disease of native coronary artery without angina pectoris: Secondary | ICD-10-CM

## 2017-09-04 DIAGNOSIS — I481 Persistent atrial fibrillation: Secondary | ICD-10-CM

## 2017-09-04 DIAGNOSIS — I4819 Other persistent atrial fibrillation: Secondary | ICD-10-CM

## 2017-09-04 DIAGNOSIS — I10 Essential (primary) hypertension: Secondary | ICD-10-CM | POA: Diagnosis not present

## 2017-09-04 DIAGNOSIS — E785 Hyperlipidemia, unspecified: Secondary | ICD-10-CM

## 2017-09-04 MED ORDER — METOPROLOL TARTRATE 25 MG PO TABS: 12.5000 mg | ORAL_TABLET | Freq: Two times a day (BID) | ORAL | 3 refills | Status: DC

## 2017-09-04 MED ORDER — AMLODIPINE BESYLATE 5 MG PO TABS: 5.0000 mg | ORAL_TABLET | Freq: Every day | ORAL | 3 refills | Status: DC

## 2017-09-04 NOTE — Patient Instructions (Addendum)
Medication Instructions:  Your physician has recommended you make the following change in your medication:  1- DECREASE Metoprolol to 12.5 mg (1/2 tablet) by mouth two times a day. 2- START Amlodipine 5 mg (1 tablet) by mouth once a day.    Labwork: none  Testing/Procedures: none  Follow-Up: Your physician recommends that you schedule a follow-up appointment in: [facility] on 09/07/17 AT 2 PM (Please arrive 15 minutes early). - Please call [phone] for directions and parking code.   Your physician recommends that you schedule a follow-up appointment in: [place].   If you need a refill on your cardiac medications before your next appointment, please call your pharmacy.

## 2017-09-04 NOTE — Progress Notes (Signed)
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  Past Medical History    Past Medical History:  Diagnosis Date  . Anxiety   . Back pain   . Coronary artery disease 08/21/2013   a. 08/2013 s/p CABG x 6.  . Diabetes mellitus   . Diabetic neuropathy ([place])   . Diastolic dysfunction    a. 02/2017 Echo: EF 55-60%, no rwma, Gr1 DD, mildly dil Ao Root/RV. + PFO.  . Diverticulosis   . Diverticulosis   . Embolic stroke ([place])    a. 04/2017 MRI in setting of freq falls: small subacute infarction of bilat centrum semi-ovale consistent w/ embolic event-->Eliquis.  . Erectile dysfunction   . Family history of anesthesia complication    ' they cant wake my brother very easy"  . Fatty liver   . Flank pain   . GERD (gastroesophageal reflux disease)   . History of bronchitis   . History of chicken pox   . 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 Hypercholesterolemia   . Hypertension   . Kidney stones   . Osteoarthritis   . Persistent atrial fibrillation ([place])    a. Had post-op AF 08/2013; b. 09/2015 Holter: PAC's no AF; c. 05/2017 Noted to be in AFib-->Eliquis (CHA2DS2VASc = 7); d. 08/2017 s/p DCCV.  Marland Kitchen PFO (patent foramen ovale)    a. 02/2017 Echo: + PFO.   Past Surgical History:  Procedure Laterality Date  . CARDIAC CATHETERIZATION  08/21/2013   DR Angelena Form  . CARDIOVERSION N/A 08/24/2017   Procedure: CARDIOVERSION;  Surgeon: Minna Merritts, MD;  Location: [facility];  Service: Cardiovascular;  Laterality: N/A;  . CHOLECYSTECTOMY  1983  . CORONARY ARTERY BYPASS GRAFT N/A 08/23/2013   Procedure: CORONARY  ARTERY BYPASS GRAFTING (CABG);  Surgeon: Grace Isaac, MD;  Location: [place];  Service: Open Heart Surgery;  Laterality: N/A;  CABG times six utilizing the left internal mammary artery and the right greater saphenous vein harvested endoscopically  . ENDOVEIN HARVEST OF GREATER SAPHENOUS VEIN Right 08/23/2013   Procedure: ENDOVEIN HARVEST OF GREATER SAPHENOUS VEIN;  Surgeon: Grace Isaac, MD;  Location: [place];  Service: Open Heart Surgery;  Laterality: Right;  . HERNIA REPAIR     X3  . INTRAOPERATIVE TRANSESOPHAGEAL ECHOCARDIOGRAM N/A 08/23/2013   Procedure: INTRAOPERATIVE TRANSESOPHAGEAL ECHOCARDIOGRAM;  Surgeon: Grace Isaac, MD;  Location: [place];  Service: Open Heart Surgery;  Laterality: N/A;  . LEFT HEART CATHETERIZATION WITH CORONARY ANGIOGRAM N/A 08/21/2013   Procedure: LEFT HEART CATHETERIZATION WITH CORONARY ANGIOGRAM;  Surgeon: Burnell Blanks, MD;  Location: [facility] CATH [facility];  Service: Cardiovascular;  Laterality: N/A;    Allergies  Allergies  Allergen Reactions  . Fenofibrate Other (See Comments)    Muscle weakness.    . Statins Other (See Comments)    MYALGIA, muscle pain    History of Present Illness    77 year old male with the above complex past medical history including CAD status post CABG x6 in January 2015, hypertension, hyperlipidemia, type 2 diabetes mellitus, and persistent atrial fibrillation.  He did have postoperative atrial fibrillation following  his bypass surgery in 2015 but this resolved after a short course of amiodarone and he only required Coumadin for a brief period.  He had palpitations in early 2017 with Holter monitoring at that time showing sinus rhythm with PACs.  In the spring 2018, he was evaluated by neurology in the setting of falls.  MRI in September 2018 showed small subacute infarction of the bilateral centrum semi-ovale consistent with a prior embolic event.  It was felt that this was most likely explained by paroxysmal atrial  fibrillation and he was placed on Eliquis 5 mg twice daily.  He was subsequently found to be in atrial fibrillation in cardiology clinic in October and was referred to A. fib clinic.  It was recommended that he consider hospitalization for Tikosyn therapy however he wished to defer.  I saw him in clinic on January 15 and he remained in rate controlled atrial fibrillation.  He had been exercising regularly and for the most part, was asymptomatic from an A. fib standpoint.  He was interested however in an attempt at rhythm management.  He underwent cardioversion on January 17 which was successful, and he almost immediately noted having more energy however, after about 4 days, he began to note an irregular pulse when checking his pulse.  With that, he also noted some fatigue and leg heaviness.  He had not realized that he was experiencing fatigue in A. fib prior to cardioversion but following cardioversion, he definitely noted a difference.  As result, he is now interested in pursuing antiarrhythmic therapy.  He has not had any chest pain, dyspnea, PND, orthopnea, dizziness, syncope, edema, or early satiety.  He does check his blood pressure at home and it typically runs in the 160s-180s.  Home Medications    Prior to Admission medications   Medication Sig Start Date End Date Taking? Authorizing Provider  acyclovir (ZOVIRAX) 800 MG tablet Take 1 tablet (800 mg total) by mouth 2 (two) times daily. 08/02/16  Yes Tonia Ghent, MD  apixaban (ELIQUIS) 5 MG TABS tablet Take 1 tablet (5 mg total) by mouth 2 (two) times daily. 08/16/17  Yes Marcial Pacas, MD  diclofenac sodium (VOLTAREN) 1 % GEL Apply 4 g topically 4 (four) times daily as needed. Patient taking differently: Apply 4 g topically 4 (four) times daily as needed (for pain).  08/02/16  Yes Tonia Ghent, MD  gabapentin (NEURONTIN) 600 MG tablet Take 1 tablet (600 mg total) by mouth 3 (three) times daily. 08/02/16  Yes Tonia Ghent, MD  latanoprost  (XALATAN) 0.005 % ophthalmic solution Place 1 drop into the right eye at bedtime. 08/29/17  Yes Tonia Ghent, MD  losartan (COZAAR) 100 MG tablet Take 1 tablet (100 mg total) by mouth daily. 08/22/17 11/20/17 Yes Theora Gianotti, NP  metFORMIN (GLUCOPHAGE) 850 MG tablet Take 1 tablet (850 mg total) by mouth 2 (two) times daily with a meal. 06/02/17  Yes Tonia Ghent, MD  metoprolol tartrate (LOPRESSOR) 25 MG tablet Take 1 tablet (25 mg total) by mouth 2 (two) times daily. 02/09/17  Yes Tonia Ghent, MD  nitroGLYCERIN (NITROSTAT) 0.4 MG SL tablet Place 1 tablet (0.4 mg total) under the tongue every 5 (five) minutes as needed for chest pain (max 3 doses). 08/16/17  Yes Tonia Ghent, MD    Review of Systems    Back in atrial fibrillation.  This is associated with some fatigue.  All other systems reviewed and are otherwise negative except as  noted above.  Physical Exam    VS:  BP (!) 160/96 (BP Location: Left Arm, Patient Position: Sitting, Cuff Size: Normal)   Pulse (!) 48   Ht 5\' 7"  (1.702 m)   Wt 164 lb 8 oz (74.6 kg)   BMI 25.76 kg/m  , BMI Body mass index is 25.76 kg/m. GEN: Well nourished, well developed, in no acute distress.  HEENT: normal.  Neck: Supple, no JVD, carotid bruits, or masses. Cardiac: Irregularly irregular, no murmurs, rubs, or gallops. No clubbing, cyanosis, edema.  Radials/DP/PT 2+ and equal bilaterally.  Respiratory:  Respirations regular and unlabored, clear to auscultation bilaterally. GI: Soft, nontender, nondistended, BS + x 4. MS: no deformity or atrophy. Skin: warm and dry, no rash. Neuro:  Strength and sensation are intact. Psych: Normal affect.  Accessory Clinical Findings    ECG -atrial fibrillation, 48, inferior infarct, no acute ST or T changes.  Assessment & Plan    1.  Persistent atrial fibrillation: Patient recently diagnosed with atrial fibrillation in October 2018.  He has been on low-dose metoprolol and Eliquis in the  setting of a CHA2DS2VASc of 7.  He had previously been reluctant to consider antiarrhythmic therapy and as result, he underwent cardioversion on January 17.  Though he did not realize he was symptomatic prior to cardioversion, after cardioversion, he noted much more energy.  He had recurrence of atrial fibrillation about 4 days later when pulling a rug up some stairs and since, has noted some fatigue.  He is interested in pursuing antiarrhythmic therapy and redo cardioversion if necessary.  He has been compliant with his Eliquis.  Heart rate is 48 today.  I am reducing his metoprolol to 12.5 mg twice daily.  We will arrange for follow-up in A. fib clinic later this week for further discussion regarding admission for dofetilide therapy.  2.  Essential hypertension: Blood pressure is elevated today.  He says it always is this high.  I am adding amlodipine 5 mg daily.  He otherwise remains on losartan and low-dose beta-blocker.  3.  Coronary artery disease: Status post CABG in January 2015.  Not having any chest pain.  He remains on beta-blocker and ARB therapy.  Not on a statin secondary to prior intolerance.  4.  Hyperlipidemia: LDL 62 in October 2018.  He is not on a statin due to prior intolerance.  5.  Patent foramen ovale: As previously noted, he is now on lifelong oral anticoagulation in the setting of persistent atrial fibrillation, which is the most likely culprit for his subacute stroke noted on MRI.  In that setting, and with age greater than 73, no current role for PFO closure.  6.  Disposition: Patient will follow-up in A. fib clinic later this week to discuss plans for admission and Tikosyn loading.   Murray Hodgkins, NP 09/04/2017, 2:38 PM

## 2017-09-05 ENCOUNTER — Ambulatory Visit: Payer: Medicare Other | Admitting: Neurology

## 2017-09-05 ENCOUNTER — Encounter: Payer: Self-pay | Admitting: Neurology

## 2017-09-05 ENCOUNTER — Telehealth: Payer: Self-pay | Admitting: Cardiovascular Disease

## 2017-09-05 ENCOUNTER — Ambulatory Visit: Payer: PPO | Admitting: Neurology

## 2017-09-05 VITALS — BP 128/73 | HR 62 | Ht 67.0 in | Wt 162.8 lb

## 2017-09-05 DIAGNOSIS — R269 Unspecified abnormalities of gait and mobility: Secondary | ICD-10-CM

## 2017-09-05 DIAGNOSIS — I63429 Cerebral infarction due to embolism of unspecified anterior cerebral artery: Secondary | ICD-10-CM

## 2017-09-05 NOTE — Telephone Encounter (Signed)
Tikosyn was discussed at yesterday's appt. He has been referred to the [facility]. Pt wanted to check w/insurance company regarding medication coverage and needed clarification of the name brand and generic name. Pt appreciative of the return call

## 2017-09-05 NOTE — Telephone Encounter (Signed)
Please call pt to discuss Tikosyn dosage. Pt has some questions.

## 2017-09-05 NOTE — Progress Notes (Signed)
PATIENT: [name] [name] DOB: [birth year]  Chief Complaint  Patient presents with  . Cerebrovascular Accident    He is here with his wife, [name] [name]. He has continued taking Eliquis.  No new concerns today.     HISTORICAL  [name] [name] is a 77 year old ambidextrous male, accompanied by his wife, seen in refer by  his primary care physician Dr.Duncan, Elveria Rising for evaluation of low back pain, gait abnormality. Initial evaluation was on February 28 2017.  He had a history of hypertension, hyperlipidemia, diabetes, coronary artery disease status post bypass surgery in 2015. 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  He also reported second episode few weeks later, again similar episode set onset mild confusion, loss control of his posturing, unsteady gait,  Since June 2018 also developed low back pain, radiating pain to both thigh, leg.  We have personally reviewed MRI of lumbar on January 20 2017: Multilevel mild degenerative disc disease, L5-S1 anterolisthesis, annular bulging, moderate to severe facet arthropathy, minimal neuroforaminal narrowing, no canal stenosis. He is taking gabapentin 600 mg 3 times a day.  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  UPDATE March 30 2017:  We have personally reviewed MRI of cervical spine, mild multilevel degenerative changes, but there was no cord compression or nerve root compression,  Compared to Dec 26 2016 when he has sudden onset falling, gait abnormality, he had a moderate improvement, mild bilateral feet numbness, history of diabetes  UPDATE Sept 4 2018: We have personally reviewed MRI of the thoracic spine in August 2018 that was normal,  MRI of the brain showed small subacute white matter lacunar infarction in bilateral centrum semiovale, possible petechial hemorrhage on the right side, no mass effect,  MRI of cervical spine showed multilevel degenerative changes, there is no significant canal stenosis, no evidence of nerve root compression  MRI of lumbar showed mild degenerative changesin the canal stenosis or foraminal stenosis  ECHO Cardiogram on February 16 2017 showed ejection fraction 55-60%, wall motion was normal,  He reported a history of intermittent atrial fibrillation, still feel intermittent palpitations, even though MRI of the brain showed no acute posterior circulation stroke, he had 2 recurrent episode of transient dizziness unsteady gait, gradually recovered ALSO suggestive of posterior circulation stroke,  UPDATE Sep 05 2017: He is overall doing very well, continue to workout 6 days a week, but he still notices mild difficulty with his walking, no longer falling, he is tolerating Eliquis 5 mg twice a day.  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  REVIEW OF SYSTEMS: Full 14 system review of systems performed and notable only for fatigue, urination problems, weakness  ALLERGIES: Allergies  Allergen Reactions  . Fenofibrate Other (See Comments)    Muscle weakness.    . Statins Other (See Comments)    MYALGIA, muscle pain    HOME MEDICATIONS: Current Outpatient Medications  Medication Sig Dispense Refill  . acyclovir (ZOVIRAX) 800 MG tablet Take 1 tablet (800 mg total) by mouth 2 (two) times daily. 180 tablet 3  . amLODipine (NORVASC) 5 MG tablet Take 1 tablet (5 mg total) by  mouth daily. 180 tablet 3  . apixaban (ELIQUIS) 5 MG TABS tablet Take 1 tablet (5 mg total) by mouth 2 (two) times daily. 180 tablet 4  . diclofenac sodium (VOLTAREN) 1 % GEL Apply 4 g topically 4 (four) times daily as needed. (Patient taking differently: Apply 4 g topically 4 (four) times daily as needed (for pain). ) 300 g prn  . gabapentin (NEURONTIN) 600 MG tablet Take 1 tablet (600 mg total) by mouth 3 (three) times daily. 270 tablet 3  . latanoprost (XALATAN) 0.005 % ophthalmic solution Place 1 drop into the right eye at bedtime. 7.5 mL 0  . losartan (COZAAR) 100 MG tablet Take 1 tablet (100 mg total) by mouth daily. 30 tablet 6  . metFORMIN (GLUCOPHAGE) 850 MG tablet Take 1 tablet (850 mg total) by mouth 2 (two) times daily with a meal. 180 tablet 1  . metoprolol tartrate (LOPRESSOR) 25 MG tablet Take 0.5 tablets (12.5 mg total) by mouth 2 (two) times daily. 90 tablet 3  . nitroGLYCERIN (NITROSTAT) 0.4 MG SL tablet Place 1 tablet (0.4 mg total) under the tongue every 5 (five) minutes as needed for chest pain (max 3 doses). 25 tablet 6   No current facility-administered medications for this visit.     PAST MEDICAL HISTORY: Past Medical History:  Diagnosis Date  . Anxiety   . Back pain   . Coronary artery disease 08/21/2013   a. 08/2013 s/p CABG x 6.  . Diabetes mellitus   . Diabetic neuropathy ([place])   . Diastolic dysfunction    a. 02/2017 Echo: EF 55-60%, no rwma, Gr1 DD, mildly dil Ao Root/RV. + PFO.  . Diverticulosis   . Diverticulosis   . Embolic stroke ([place])    a. 04/2017 MRI in setting of freq falls: small subacute infarction of bilat centrum semi-ovale consistent w/ embolic event-->Eliquis.  . Erectile dysfunction   . Family history of anesthesia complication    ' they cant wake my brother very easy"  . Fatty liver   . Flank pain   . GERD (gastroesophageal reflux disease)   . History of bronchitis   . History of chicken pox   . HSV infection    ocular symptoms and oral  lesions  . Hypercholesterolemia   . Hypertension   . Kidney stones   . Osteoarthritis   . Persistent atrial fibrillation ([place])    a. Had post-op AF 08/2013; b. 09/2015 Holter: PAC's no AF; c. 05/2017 Noted to be in AFib-->Eliquis (CHA2DS2VASc = 7); d. 08/2017 s/p DCCV.  Marland Kitchen PFO (patent foramen ovale)    a. 02/2017 Echo: + PFO.    PAST SURGICAL HISTORY: Past Surgical History:  Procedure Laterality Date  . CARDIAC CATHETERIZATION  08/21/2013   DR Angelena Form  . CARDIOVERSION N/A 08/24/2017   Procedure: CARDIOVERSION;  Surgeon: Minna Merritts, MD;  Location: [facility];  Service: Cardiovascular;  Laterality: N/A;  . CHOLECYSTECTOMY  1983  . CORONARY ARTERY BYPASS GRAFT N/A 08/23/2013   Procedure: CORONARY ARTERY BYPASS GRAFTING (CABG);  Surgeon: Grace Isaac, MD;  Location: [place];  Service: Open Heart Surgery;  Laterality: N/A;  CABG times six utilizing the left internal mammary artery and the right greater saphenous vein harvested endoscopically  . ENDOVEIN HARVEST  OF GREATER SAPHENOUS VEIN Right 08/23/2013   Procedure: ENDOVEIN HARVEST OF GREATER SAPHENOUS VEIN;  Surgeon: Grace Isaac, MD;  Location: [place];  Service: Open Heart Surgery;  Laterality: Right;  . HERNIA REPAIR     X3  . INTRAOPERATIVE TRANSESOPHAGEAL ECHOCARDIOGRAM N/A 08/23/2013   Procedure: INTRAOPERATIVE TRANSESOPHAGEAL ECHOCARDIOGRAM;  Surgeon: Grace Isaac, MD;  Location: [place];  Service: Open Heart Surgery;  Laterality: N/A;  . LEFT HEART CATHETERIZATION WITH CORONARY ANGIOGRAM N/A 08/21/2013   Procedure: LEFT HEART CATHETERIZATION WITH CORONARY ANGIOGRAM;  Surgeon: Burnell Blanks, MD;  Location[facility] C[facility] LAB;  Service: Cardiovascular;  Laterality: N/A;    FAMILY HISTORY: Family History  Problem Relation Age of Onset  . Stroke Mother   . Diabetes Mother   . Heart disease Father        heart failure  . Colon cancer Maternal Grandmother   . Parkinsonism Maternal Grandfather   . Stroke Paternal  Grandfather   . Prostate cancer Maternal Uncle   . CAD Brother        CABG    SOCIAL HISTORY:  Social History   Socioeconomic History  . Marital status: Married    Spouse name: Not on file  . Number of children: 2  . Years of education: HS  . Highest education level: Not on file  Social Needs  . Financial resource strain: Not on file  . Food insecurity - worry: Not on file  . Food insecurity - inability: Not on file  . Transportation needs - medical: Not on file  . Transportation needs - non-medical: Not on file  Occupational History  . Occupation: Retired  Tobacco Use  . Smoking status: Never Smoker  . Smokeless tobacco: Never Used  Substance and Sexual Activity  . Alcohol use: No    Alcohol/week: 0.0 oz  . Drug use: No  . Sexual activity: Not on file  Other Topics Concern  . 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   Right-handed.   2 cups caffeine daily.     PHYSICAL EXAM   Vitals:   09/05/17 1532  BP: 128/73  Pulse: 62  Weight: 162 lb 12 oz (73.8 kg)  Height: 5\' 7"  (1.702 m)    Not recorded      Body mass index is 25.49 kg/m.  PHYSICAL EXAMNIATION:  Gen: NAD, conversant, well nourised, obese, well groomed                     Cardiovascular: Regular rate rhythm, no peripheral edema, warm, nontender. Eyes: Conjunctivae clear without exudates or hemorrhage Neck: Supple, no carotid bruits. Pulmonary: Clear to auscultation bilaterally   NEUROLOGICAL EXAM:  MENTAL STATUS: Speech:    Speech is normal; fluent and spontaneous with normal comprehension.  Cognition:     Orientation to time, place and person     Normal recent and remote memory     Normal Attention span and concentration     Normal Language, naming, repeating,spontaneous speech     Fund of knowledge   CRANIAL NERVES: CN II: Visual fields are full  to confrontation. Fundoscopic exam is normal with sharp discs and no vascular changes. Pupils are round equal and briskly reactive to light. CN III, IV, VI: extraocular movement are normal. No ptosis. CN V: Facial sensation is intact to pinprick  in all 3 divisions bilaterally. Corneal responses are intact.  CN VII: Face is symmetric with normal eye closure and smile. CN VIII: Hearing is normal to rubbing fingers CN IX, X: Palate elevates symmetrically. Phonation is normal. CN XI: Head turning and shoulder shrug are intact CN XII: Tongue is midline with normal movements and no atrophy.  MOTOR: There is no pronator drift of out-stretched arms. Muscle bulk and tone are normal. Muscle strength is normal.  REFLEXES: Reflexes are 3 and symmetric at the biceps, triceps, knees, and ankles. Plantar responses are extensor  SENSORY: Intact to light touch, pinprick, positional sensation and vibratory sensation are intact in fingers and toes.  COORDINATION: He has mild trunk ataxia, tends to lean towards the left side, mild left heel-to-shin dysmetria,  GAIT/STANCE: He is able to arm cross, getting up from seated position without difficulty,mild difficulty with tandem walking  DIAGNOSTIC DATA (LABS, IMAGING, TESTING) - I reviewed patient records, labs, notes, testing and imaging myself where available.   ASSESSMENT AND PLAN  LOVELACE CERVENY is a 77 y.o. male   Sudden onset gait abnormality, falls since Dec 26 2016, continued evidence of mild trunk ataxia leaning towards the left side  MRI of brain showed small subacute infarction at bilateral centrum semiovale, consistent with embolic event, even though MRI of the brain did not show a posterior circulation stroke, but his clinical history, evolving course is most consistent with brainstem/cerebellar stroke,  This can be related to his paroxysmal atrial fibrillation,  Is doing well with Eliquis 5 mg twice a day,   Complete evaluation with MRA of the  brain and neck    [name] [name], M.D. Ph.D.  [facility] [address] [place], [place] [address] Ph: [phone] Fax: [phone]  IR:CVELFY, Elveria Rising, MD

## 2017-09-07 ENCOUNTER — Ambulatory Visit (HOSPITAL_COMMUNITY)
Admission: RE | Admit: 2017-09-07 | Discharge: 2017-09-07 | Disposition: A | Discharge status: Home / Self Care | Payer: Medicare Other | Source: Ambulatory Visit | Attending: Nurse Practitioner | Admitting: Nurse Practitioner

## 2017-09-07 ENCOUNTER — Encounter (HOSPITAL_COMMUNITY): Payer: Self-pay | Admitting: Nurse Practitioner

## 2017-09-07 VITALS — BP 144/76 | HR 69 | Ht 67.0 in | Wt 163.0 lb

## 2017-09-07 DIAGNOSIS — I2510 Atherosclerotic heart disease of native coronary artery without angina pectoris: Secondary | ICD-10-CM | POA: Diagnosis not present

## 2017-09-07 DIAGNOSIS — I10 Essential (primary) hypertension: Secondary | ICD-10-CM | POA: Diagnosis not present

## 2017-09-07 DIAGNOSIS — Z8249 Family history of ischemic heart disease and other diseases of the circulatory system: Secondary | ICD-10-CM | POA: Insufficient documentation

## 2017-09-07 DIAGNOSIS — I481 Persistent atrial fibrillation: Secondary | ICD-10-CM

## 2017-09-07 DIAGNOSIS — I4891 Unspecified atrial fibrillation: Secondary | ICD-10-CM | POA: Diagnosis present

## 2017-09-07 DIAGNOSIS — I4819 Other persistent atrial fibrillation: Secondary | ICD-10-CM

## 2017-09-07 DIAGNOSIS — Z9049 Acquired absence of other specified parts of digestive tract: Secondary | ICD-10-CM | POA: Diagnosis not present

## 2017-09-07 DIAGNOSIS — Z823 Family history of stroke: Secondary | ICD-10-CM | POA: Diagnosis not present

## 2017-09-07 DIAGNOSIS — Z7901 Long term (current) use of anticoagulants: Secondary | ICD-10-CM | POA: Diagnosis not present

## 2017-09-07 DIAGNOSIS — Z87442 Personal history of urinary calculi: Secondary | ICD-10-CM | POA: Diagnosis not present

## 2017-09-07 DIAGNOSIS — Z951 Presence of aortocoronary bypass graft: Secondary | ICD-10-CM | POA: Insufficient documentation

## 2017-09-07 DIAGNOSIS — Z79899 Other long term (current) drug therapy: Secondary | ICD-10-CM | POA: Insufficient documentation

## 2017-09-07 DIAGNOSIS — E1140 Type 2 diabetes mellitus with diabetic neuropathy, unspecified: Secondary | ICD-10-CM | POA: Insufficient documentation

## 2017-09-07 DIAGNOSIS — Z888 Allergy status to other drugs, medicaments and biological substances status: Secondary | ICD-10-CM | POA: Diagnosis not present

## 2017-09-07 DIAGNOSIS — E7800 Pure hypercholesterolemia, unspecified: Secondary | ICD-10-CM | POA: Insufficient documentation

## 2017-09-07 DIAGNOSIS — Z833 Family history of diabetes mellitus: Secondary | ICD-10-CM | POA: Diagnosis not present

## 2017-09-07 DIAGNOSIS — Z7984 Long term (current) use of oral hypoglycemic drugs: Secondary | ICD-10-CM | POA: Diagnosis not present

## 2017-09-07 DIAGNOSIS — Z8673 Personal history of transient ischemic attack (TIA), and cerebral infarction without residual deficits: Secondary | ICD-10-CM | POA: Diagnosis not present

## 2017-09-07 DIAGNOSIS — M1990 Unspecified osteoarthritis, unspecified site: Secondary | ICD-10-CM | POA: Insufficient documentation

## 2017-09-07 DIAGNOSIS — K219 Gastro-esophageal reflux disease without esophagitis: Secondary | ICD-10-CM | POA: Insufficient documentation

## 2017-09-07 NOTE — Progress Notes (Signed)
Primary Care Physician: Tonia Ghent, MD Referring Physician: Ignacia Bayley, PA Cardiologist: Dr. [name] is a 77 y.o. male with a h/o CAD status post CABG x6 in January 2015,( required amiodarone/warfarin for a short period of time), diabetes, hypertension, hyperlipidemia, PFO, embolic CVA. In the spring 2018, he was evaluated by neurology in the setting of falls.  MRI in September 2018 showed small subacute infarction of the bilateral centrum semi-ovale consistent with a prior embolic event.  It was felt that this was most likely explained by paroxysmal atrial fibrillation and he was placed on Eliquis 5 mg twice a day. He has persistent atrial fibrillation status post cardioversion earlier this month, who presents for evaluation in the afib clinic for means to restore SR.  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 He has noted some fatigue and irregular heart beat with his activities in afib. His afib has slow v response and his metoprolol was reduced when seen by [name]. He has checked with insurance and Tikosyn will be $100  a month. He is not excited having to come into hospital for 4 days. Sotalol would be affordable, but would be contraindicated with pt slow at baseline. No alcohol, tobacco, minimumal caffeine.  Today, he denies symptoms of chest pain, shortness of breath, orthopnea, PND, lower extremity edema, dizziness, presyncope, syncope, or neurologic sequela.+ for fatigue /palpitations. The patient is tolerating medications without difficulties and is otherwise without complaint today.   Past Medical History:  Diagnosis Date  . Anxiety   . Back pain   . Coronary artery disease 08/21/2013   a. 08/2013 s/p CABG x 6.  . Diabetes mellitus   . Diabetic neuropathy ([place])   . Diastolic dysfunction    a. 02/2017 Echo: EF 55-60%, no rwma, Gr1 DD, mildly dil Ao Root/RV. + PFO.  . Diverticulosis   . Diverticulosis   . Embolic stroke ([place])    a.  04/2017 MRI in setting of freq falls: small subacute infarction of bilat centrum semi-ovale consistent w/ embolic event-->Eliquis.  . Erectile dysfunction   . Family history of anesthesia complication    ' they cant wake my brother very easy"  . Fatty liver   . Flank pain   . GERD (gastroesophageal reflux disease)   . History of bronchitis   . History of chicken pox   . 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 Hypercholesterolemia   . Hypertension   . Kidney stones   . Osteoarthritis   . Persistent atrial fibrillation ([place])    a. Had post-op AF 08/2013; b. 09/2015 Holter: PAC's no AF; c. 05/2017 Noted to be in AFib-->Eliquis (CHA2DS2VASc = 7); d. 08/2017 s/p DCCV.  Marland Kitchen PFO (patent foramen ovale)    a. 02/2017 Echo: + PFO.   Past Surgical History:  Procedure Laterality Date  . CARDIAC CATHETERIZATION  08/21/2013   DR Angelena Form  . CARDIOVERSION N/A 08/24/2017   Procedure: CARDIOVERSION;  Surgeon: Minna Merritts, MD;  Location: [facility]S;  Service: Cardiovascular;  Laterality: N/A;  . CHOLECYSTECTOMY  1983  . CORONARY ARTERY BYPASS GRAFT N/A 08/23/2013   Procedure: CORONARY ARTERY BYPASS GRAFTING (CABG);  Surgeon: Grace Isaac, MD;  Location: [place];  Service: Open Heart Surgery;  Laterality: N/A;  CABG times six utilizing the left internal mammary artery and the right greater saphenous vein harvested endoscopically  . ENDOVEIN HARVEST OF GREATER SAPHENOUS VEIN Right  08/23/2013   Procedure: ENDOVEIN HARVEST OF GREATER SAPHENOUS VEIN;  Surgeon: Grace Isaac, MD;  Location: [place];  Service: Open Heart Surgery;  Laterality: Right;  . HERNIA REPAIR     X3  . INTRAOPERATIVE TRANSESOPHAGEAL ECHOCARDIOGRAM N/A 08/23/2013   Procedure: INTRAOPERATIVE TRANSESOPHAGEAL ECHOCARDIOGRAM;  Surgeon: Grace Isaac, MD;  Location: [place];  Service: Open Heart Surgery;  Laterality: N/A;  . LEFT HEART CATHETERIZATION WITH CORONARY ANGIOGRAM N/A 08/21/2013   Procedure: LEFT HEART  CATHETERIZATION WITH CORONARY ANGIOGRAM;  Surgeon: Burnell Blanks, MD;  Location[facility] LAB;  Service: Cardiovascular;  Laterality: N/A;    Current Outpatient Medications  Medication Sig Dispense Refill  . acyclovir (ZOVIRAX) 800 MG tablet Take 1 tablet (800 mg total) by mouth 2 (two) times daily. 180 tablet 3  . amLODipine (NORVASC) 5 MG tablet Take 1 tablet (5 mg total) by mouth daily. 180 tablet 3  . apixaban (ELIQUIS) 5 MG TABS tablet Take 1 tablet (5 mg total) by mouth 2 (two) times daily. 180 tablet 4  . diclofenac sodium (VOLTAREN) 1 % GEL Apply 4 g topically 4 (four) times daily as needed. (Patient taking differently: Apply 4 g topically 4 (four) times daily as needed (for pain). ) 300 g prn  . gabapentin (NEURONTIN) 600 MG tablet Take 1 tablet (600 mg total) by mouth 3 (three) times daily. 270 tablet 3  . latanoprost (XALATAN) 0.005 % ophthalmic solution Place 1 drop into the right eye at bedtime. 7.5 mL 0  . losartan (COZAAR) 100 MG tablet Take 1 tablet (100 mg total) by mouth daily. 30 tablet 6  . metFORMIN (GLUCOPHAGE) 850 MG tablet Take 1 tablet (850 mg total) by mouth 2 (two) times daily with a meal. 180 tablet 1  . metoprolol tartrate (LOPRESSOR) 25 MG tablet Take 0.5 tablets (12.5 mg total) by mouth 2 (two) times daily. 90 tablet 3  . nitroGLYCERIN (NITROSTAT) 0.4 MG SL tablet Place 1 tablet (0.4 mg total) under the tongue every 5 (five) minutes as needed for chest pain (max 3 doses). 25 tablet 6   No current facility-administered medications for this encounter.     Allergies  Allergen Reactions  . Fenofibrate Other (See Comments)    Muscle weakness.    . Statins Other (See Comments)    MYALGIA, muscle pain    Social History   Socioeconomic History  . Marital status: Married    Spouse name: Not on file  . Number of children: 2  . Years of education: HS  . Highest education level: Not on file  Social Needs  . Financial resource strain: Not on file  . Food  insecurity - worry: Not on file  . Food insecurity - inability: Not on file  . Transportation needs - medical: Not on file  . Transportation needs - non-medical: Not on file  Occupational History  . Occupation: Retired  Tobacco Use  . Smoking status: Never Smoker  . Smokeless tobacco: Never Used  Substance and Sexual Activity  . Alcohol use: No    Alcohol/week: 0.0 oz  . Drug use: No  . Sexual activity: Not on file  Other Topics Concern  . 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   Right-handed.   2 cups caffeine daily.    Family History  Problem Relation Age of Onset  .  Stroke Mother   . Diabetes Mother   . Heart disease Father        heart failure  . Colon cancer Maternal Grandmother   . Parkinsonism Maternal Grandfather   . Stroke Paternal Grandfather   . Prostate cancer Maternal Uncle   . CAD Brother        CABG    ROS- All systems are reviewed and negative except as per the HPI above  Physical Exam: Vitals:   09/07/17 1404  BP: (!) 144/76  Pulse: 69  Weight: 163 lb (73.9 kg)  Height: 5\' 7"  (1.702 m)   Wt Readings from Last 3 Encounters:  09/07/17 163 lb (73.9 kg)  09/05/17 162 lb 12 oz (73.8 kg)  09/04/17 164 lb 8 oz (74.6 kg)    Labs: Lab Results  Component Value Date   NA 140 08/16/2017   K 5.2 (H) 08/16/2017   CL 102 08/16/2017   CO2 31 08/16/2017   GLUCOSE 125 (H) 08/16/2017   BUN 14 08/16/2017   CREATININE 0.79 08/16/2017   CALCIUM 10.5 08/16/2017   PHOS 3.2 06/02/2014   MG 2.1 06/02/2014   Lab Results  Component Value Date   INR 1.38 08/30/2013   Lab Results  Component Value Date   CHOL 112 06/07/2017   HDL 29.60 (L) 06/07/2017   LDLCALC 62 06/07/2017   TRIG 99.0 06/07/2017     GEN- The patient is well appearing, alert and oriented x 3 today.   Head- normocephalic, atraumatic Eyes-  Sclera  clear, conjunctiva pink Ears- hearing intact Oropharynx- clear Neck- supple, no JVP Lymph- no cervical lymphadenopathy Lungs- Clear to ausculation bilaterally, normal work of breathing Heart- irregular rate and rhythm, no murmurs, rubs or gallops, PMI not laterally displaced GI- soft, NT, ND, + BS Extremities- no clubbing, cyanosis, or edema MS- no significant deformity or atrophy Skin- no rash or lesion Psych- euthymic mood, full affect Neuro- strength and sensation are intact  EKG-afib with v rate of 69 bpm, qrs int 90 ms, qtc 458 ms Epic records reviewed Echo-Study Conclusions  - Left ventricle: The cavity size was normal. Wall thickness was   increased in a pattern of mild LVH. Systolic function was normal.   The estimated ejection fraction was in the range of 55% to 60%.   Wall motion was normal; there were no regional wall motion   abnormalities. Doppler parameters are consistent with abnormal   left ventricular relaxation (grade 1 diastolic dysfunction). - Aortic valve: Valve mobility was mildly restricted. There was   trivial regurgitation. - Aortic root: The aortic root was mildly dilated. - Right ventricle: The cavity size was mildly dilated. - Atrial septum: There was a patent foramen ovale.  Impressions:  - Normal LV systolic function; mild diastolic dysfunction;   sclerotic aortic valve with trace AI; mildly dilated aortic root;   mild RVE; PFO noted by color doppler.   Assessment and Plan: 1. Persistent afib  Successful cardioversion 08/2017 but ERAF Pt explained options, continue  with rate controlled afib, Tikosyn( may be cost prohibitive) or amiodarone, possibly ablation, if Dr. Rayann Heman would consider ablation with amiodarone fitting guidelines of previously being on an antiarrythmic. Left atrium is 45 mm.  He will think about the drug options and I will discuss with Dr. Rayann Heman if he would be an candidate for ablation as I feel he is not preferring the drug  route, and get back in touch with pt.Geroge Baseman Nicolle Heward, [place]  [facility] [address] [place], [place] [address] [phone]

## 2017-09-12 ENCOUNTER — Telehealth: Payer: Self-pay | Admitting: Pharmacist

## 2017-09-12 NOTE — Telephone Encounter (Signed)
Medication list reviewed in anticipation of upcoming Tikosyn initiation. Patient is not taking any contraindicated or QTc prolonging medications.   Patient is anticoagulated on Eliquis 5mg BID on the appropriate dose. Please ensure that patient has not missed any anticoagulation doses in the 3 weeks prior to Tikosyn initiation.   Patient will need to be counseled to avoid use of Benadryl while on Tikosyn and in the 2-3 days prior to Tikosyn initiation. 

## 2017-09-14 ENCOUNTER — Other Ambulatory Visit: Payer: Self-pay | Admitting: *Deleted

## 2017-09-14 MED ORDER — APIXABAN 5 MG PO TABS: 5.0000 mg | ORAL_TABLET | Freq: Two times a day (BID) | ORAL | 4 refills | Status: DC

## 2017-09-15 ENCOUNTER — Encounter: Payer: Self-pay | Admitting: Family Medicine

## 2017-09-15 ENCOUNTER — Other Ambulatory Visit: Payer: Self-pay | Admitting: *Deleted

## 2017-09-15 ENCOUNTER — Ambulatory Visit: Payer: Medicare Other | Admitting: Family Medicine

## 2017-09-15 DIAGNOSIS — I481 Persistent atrial fibrillation: Secondary | ICD-10-CM

## 2017-09-15 DIAGNOSIS — I4819 Other persistent atrial fibrillation: Secondary | ICD-10-CM

## 2017-09-15 DIAGNOSIS — H6123 Impacted cerumen, bilateral: Secondary | ICD-10-CM | POA: Diagnosis not present

## 2017-09-15 NOTE — Telephone Encounter (Signed)
Faxed refill request.  Latanoprost 0.005% Last office visit:   08/25/2017 Last Filled:     7.5 mL 0 08/29/2017  Please advise.

## 2017-09-15 NOTE — Telephone Encounter (Signed)
He has enough of med, d/w pt at [place].  Denied the rx.  He will continue rx.

## 2017-09-15 NOTE — Progress Notes (Signed)
He got hearing aids and wanted his L ear checked.  He had a lot of wax removed recently.  He had some pain in the L ear last night.  Clearly better today.  No FCNAVD.  He doesn't feel unwell o/w.    He is back in A fib in the meantime with cards f/u pending.  I'll defer to cards.    He has f/u with Dr Andreas Ohm pending in [place] at the eye clinic.  He didn't need refill on xalatan yet.  I'll defer to eye clinic for further refill.    Meds, vitals, and allergies reviewed.   ROS: Per HPI unless specifically indicated in ROS section   nad ncat IRR, not tachy B ear canal wax removed with curette without complication but he does have some residual canal irritation that was not caused by treatment with curette at the office visit.  D/w pt.   Recheck tympanic membranes within normal limits bilaterally

## 2017-09-15 NOTE — Patient Instructions (Signed)
You have some mild ear canal irritation, likely from the wax irritating the skin.   The wax is out.  In a few days, start irrigating your ear canals with warm water in the shower.  That should soften or remove wax.   Take care.  Glad to see you.  Update me as needed.

## 2017-09-17 DIAGNOSIS — H6123 Impacted cerumen, bilateral: Secondary | ICD-10-CM | POA: Insufficient documentation

## 2017-09-17 NOTE — Assessment & Plan Note (Signed)
B ear canal wax removed with curette without complication but he does have some residual canal irritation that was not caused by treatment with curette at the office visit.  D/w pt.   routine instructions given to patient.

## 2017-09-17 NOTE — Assessment & Plan Note (Signed)
Per cardiology.  I appreciate the help of all involved.

## 2017-09-18 ENCOUNTER — Inpatient Hospital Stay (HOSPITAL_COMMUNITY)
Admission: RE | Admit: 2017-09-18 | Discharge: 2017-09-21 | DRG: 309 | Disposition: A | Discharge status: Home / Self Care | Payer: Medicare Other | Source: Ambulatory Visit | Attending: Internal Medicine | Admitting: Internal Medicine

## 2017-09-18 ENCOUNTER — Ambulatory Visit (HOSPITAL_COMMUNITY)
Admission: RE | Admit: 2017-09-18 | Discharge: 2017-09-18 | Disposition: A | Discharge status: Home / Self Care | Payer: Medicare Other | Source: Ambulatory Visit | Attending: Nurse Practitioner | Admitting: Nurse Practitioner

## 2017-09-18 ENCOUNTER — Other Ambulatory Visit: Payer: Self-pay

## 2017-09-18 ENCOUNTER — Encounter (HOSPITAL_COMMUNITY): Payer: Self-pay | Admitting: Nurse Practitioner

## 2017-09-18 VITALS — BP 168/70 | HR 78 | Ht 67.0 in | Wt 165.0 lb

## 2017-09-18 DIAGNOSIS — I4819 Other persistent atrial fibrillation: Secondary | ICD-10-CM

## 2017-09-18 DIAGNOSIS — Z951 Presence of aortocoronary bypass graft: Secondary | ICD-10-CM | POA: Diagnosis not present

## 2017-09-18 DIAGNOSIS — I10 Essential (primary) hypertension: Secondary | ICD-10-CM | POA: Diagnosis not present

## 2017-09-18 DIAGNOSIS — Z9181 History of falling: Secondary | ICD-10-CM | POA: Diagnosis not present

## 2017-09-18 DIAGNOSIS — Z8673 Personal history of transient ischemic attack (TIA), and cerebral infarction without residual deficits: Secondary | ICD-10-CM

## 2017-09-18 DIAGNOSIS — R001 Bradycardia, unspecified: Secondary | ICD-10-CM | POA: Diagnosis not present

## 2017-09-18 DIAGNOSIS — M1990 Unspecified osteoarthritis, unspecified site: Secondary | ICD-10-CM | POA: Diagnosis not present

## 2017-09-18 DIAGNOSIS — I2510 Atherosclerotic heart disease of native coronary artery without angina pectoris: Secondary | ICD-10-CM | POA: Diagnosis not present

## 2017-09-18 DIAGNOSIS — Z7901 Long term (current) use of anticoagulants: Secondary | ICD-10-CM

## 2017-09-18 DIAGNOSIS — Z7984 Long term (current) use of oral hypoglycemic drugs: Secondary | ICD-10-CM | POA: Diagnosis not present

## 2017-09-18 DIAGNOSIS — Z888 Allergy status to other drugs, medicaments and biological substances status: Secondary | ICD-10-CM | POA: Diagnosis not present

## 2017-09-18 DIAGNOSIS — I481 Persistent atrial fibrillation: Secondary | ICD-10-CM | POA: Diagnosis not present

## 2017-09-18 DIAGNOSIS — K760 Fatty (change of) liver, not elsewhere classified: Secondary | ICD-10-CM | POA: Diagnosis not present

## 2017-09-18 DIAGNOSIS — Z79899 Other long term (current) drug therapy: Secondary | ICD-10-CM | POA: Diagnosis not present

## 2017-09-18 DIAGNOSIS — E1140 Type 2 diabetes mellitus with diabetic neuropathy, unspecified: Secondary | ICD-10-CM | POA: Diagnosis not present

## 2017-09-18 DIAGNOSIS — Q211 Atrial septal defect: Secondary | ICD-10-CM

## 2017-09-18 LAB — MAGNESIUM: MAGNESIUM: 2.3 mg/dL (ref 1.7–2.4)

## 2017-09-18 LAB — BASIC METABOLIC PANEL
Anion gap: 10 (ref 5–15)
BUN: 12 mg/dL (ref 6–20)
CALCIUM: 9.7 mg/dL (ref 8.9–10.3)
CO2: 27 mmol/L (ref 22–32)
Chloride: 103 mmol/L (ref 101–111)
Creatinine, Ser: 0.78 mg/dL (ref 0.61–1.24)
GFR calc non Af Amer: >60 mL/min (ref >60)
Glucose, Bld: 121 mg/dL — ABNORMAL HIGH (ref 65–99)
Potassium: 4.8 mmol/L (ref 3.5–5.1)
SODIUM: 140 mmol/L (ref 135–145)

## 2017-09-18 MED ORDER — METOPROLOL TARTRATE 12.5 MG HALF TABLET
12.5000 mg | ORAL_TABLET | Freq: Two times a day (BID) | ORAL | Status: DC
  Administered 2017-09-18 – 2017-09-19 (×2): 12.5 mg via ORAL
  Filled 2017-09-18 (×3): qty 1

## 2017-09-18 MED ORDER — LATANOPROST 0.005 % OP SOLN
1.0000 [drp] | Freq: Every day | OPHTHALMIC | Status: DC
  Administered 2017-09-18 – 2017-09-20 (×3): 1 [drp] via OPHTHALMIC
  Filled 2017-09-18: qty 2.5

## 2017-09-18 MED ORDER — DOFETILIDE 500 MCG PO CAPS
500.0000 ug | ORAL_CAPSULE | Freq: Two times a day (BID) | ORAL | Status: DC
  Administered 2017-09-18 – 2017-09-21 (×6): 500 ug via ORAL
  Filled 2017-09-18 (×6): qty 1

## 2017-09-18 MED ORDER — METFORMIN HCL 850 MG PO TABS
850.0000 mg | ORAL_TABLET | Freq: Two times a day (BID) | ORAL | Status: DC
  Administered 2017-09-19 – 2017-09-21 (×5): 850 mg via ORAL
  Filled 2017-09-18 (×6): qty 1

## 2017-09-18 MED ORDER — APIXABAN 5 MG PO TABS
5.0000 mg | ORAL_TABLET | Freq: Two times a day (BID) | ORAL | Status: DC
  Administered 2017-09-18 – 2017-09-21 (×6): 5 mg via ORAL
  Filled 2017-09-18 (×6): qty 1

## 2017-09-18 MED ORDER — GABAPENTIN 600 MG PO TABS
600.0000 mg | ORAL_TABLET | Freq: Three times a day (TID) | ORAL | Status: DC
  Administered 2017-09-18 – 2017-09-21 (×4): 600 mg via ORAL
  Filled 2017-09-18 (×7): qty 1

## 2017-09-18 MED ORDER — NITROGLYCERIN 0.4 MG SL SUBL: 0.4000 mg | SUBLINGUAL_TABLET | SUBLINGUAL | Status: DC | PRN

## 2017-09-18 MED ORDER — SODIUM CHLORIDE 0.9% FLUSH: 3.0000 mL | INTRAVENOUS | Status: DC | PRN

## 2017-09-18 MED ORDER — ACYCLOVIR 800 MG PO TABS
800.0000 mg | ORAL_TABLET | Freq: Two times a day (BID) | ORAL | Status: DC
  Administered 2017-09-18 – 2017-09-21 (×6): 800 mg via ORAL
  Filled 2017-09-18 (×7): qty 1

## 2017-09-18 MED ORDER — LOSARTAN POTASSIUM 50 MG PO TABS
100.0000 mg | ORAL_TABLET | Freq: Every day | ORAL | Status: DC
  Administered 2017-09-18 – 2017-09-20 (×3): 100 mg via ORAL
  Filled 2017-09-18 (×3): qty 2

## 2017-09-18 MED ORDER — SODIUM CHLORIDE 0.9 % IV SOLN: 250.0000 mL | INTRAVENOUS | Status: DC | PRN

## 2017-09-18 MED ORDER — AMLODIPINE BESYLATE 5 MG PO TABS
5.0000 mg | ORAL_TABLET | Freq: Every day | ORAL | Status: DC
Start: 2017-09-19 — End: 2017-09-21
  Administered 2017-09-19 – 2017-09-21 (×3): 5 mg via ORAL
  Filled 2017-09-18 (×3): qty 1

## 2017-09-18 MED ORDER — SODIUM CHLORIDE 0.9% FLUSH
3.0000 mL | Freq: Two times a day (BID) | INTRAVENOUS | Status: DC
  Administered 2017-09-18 – 2017-09-21 (×4): 3 mL via INTRAVENOUS

## 2017-09-18 NOTE — H&P (Signed)
[name] [name] is a 77 y.o. male with a h/o CAD status post CABG x6 in January 2015,( required amiodarone/warfarin for a short period of time), diabetes, hypertension, hyperlipidemia, PFO, embolic CVA.In the spring 2018, he was evaluated by neurology in the setting of falls. MRI in September 2018 showed small subacute infarction of the bilateral centrum semi-ovale consistent with a prior embolic event. It was felt that this was most likely explained by paroxysmal atrial fibrillation and he was placed on Eliquis 5 mg twice a day.He has persistent atrial fibrillation status post cardioversion earlier this month.  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 He has noted some fatigue and irregular heart beat with his activities in afib. His afib has slow v response and his metoprolol was reduced when seen by [name]. He has checked with insurance and can afford dofetilide. On last visit, 09/07/17, when antiarrythmic options were discussed,he was  not excited having to come into hospital for 4 days. Sotalol would be affordable, but would be contraindicated with pt slow at baseline. No alcohol, tobacco, minimal caffeine. He prefers not to go back on amiodarone for possible long term side effects.  Today, he denies symptoms of chest pain, shortness of breath, orthopnea, PND, lower extremity edema, dizziness, presyncope, syncope, or neurologic sequela.+ for fatigue /palpitations. The patient is tolerating medications without difficulties and is otherwise without complaint today.       Past Medical History:  Diagnosis Date  . Anxiety   . Back pain   . Coronary artery disease 08/21/2013   a. 08/2013 s/p CABG x 6.  . Diabetes mellitus   . Diabetic neuropathy ([place])   . Diastolic dysfunction    a. 02/2017 Echo: EF 55-60%, no rwma, Gr1 DD, mildly dil Ao Root/RV. + PFO.  . Diverticulosis   . Diverticulosis   . Embolic stroke ([place])    a. 04/2017 MRI in setting of freq falls: small  subacute infarction of bilat centrum semi-ovale consistent w/ embolic event-->Eliquis.  . Erectile dysfunction   . Family history of anesthesia complication    ' they cant wake my brother very easy"  . Fatty liver   . Flank pain   . GERD (gastroesophageal reflux disease)   . History of bronchitis   . History of chicken pox   . 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 Hypercholesterolemia   . Hypertension   . Kidney stones   . Osteoarthritis   . Persistent atrial fibrillation ([place])    a. Had post-op AF 08/2013; b. 09/2015 Holter: PAC's no AF; c. 05/2017 Noted to be in AFib-->Eliquis (CHA2DS2VASc = 7); d. 08/2017 s/p DCCV.  Marland Kitchen PFO (patent foramen ovale)    a. 02/2017 Echo: + PFO.        Past Surgical History:  Procedure Laterality Date  . CARDIAC CATHETERIZATION  08/21/2013   DR Angelena Form  . CARDIOVERSION N/A 08/24/2017   Procedure: CARDIOVERSION;  Surgeon: Minna Merritts, MD;  Location: [facility];  Service: Cardiovascular;  Laterality: N/A;  . CHOLECYSTECTOMY  1983  . CORONARY ARTERY BYPASS GRAFT N/A 08/23/2013   Procedure: CORONARY ARTERY BYPASS GRAFTING (CABG);  Surgeon: Grace Isaac, MD;  Location: [place];  Service: Open Heart Surgery;  Laterality: N/A;  CABG times six utilizing the left internal mammary artery and the right greater saphenous vein harvested endoscopically  . ENDOVEIN HARVEST OF GREATER SAPHENOUS VEIN Right 08/23/2013   Procedure: ENDOVEIN HARVEST OF  GREATER SAPHENOUS VEIN;  Surgeon: Grace Isaac, MD;  Location: [place];  Service: Open Heart Surgery;  Laterality: Right;  . HERNIA REPAIR     X3  . INTRAOPERATIVE TRANSESOPHAGEAL ECHOCARDIOGRAM N/A 08/23/2013   Procedure: INTRAOPERATIVE TRANSESOPHAGEAL ECHOCARDIOGRAM;  Surgeon: Grace Isaac, MD;  Location: [facility];  Service: Open Heart Surgery;  Laterality: N/A;  . LEFT HEART CATHETERIZATION WITH CORONARY ANGIOGRAM N/A 08/21/2013   Procedure: LEFT HEART CATHETERIZATION  WITH CORONARY ANGIOGRAM;  Surgeon: Burnell Blanks, MD;  Location: [facility];  Service: Cardiovascular;  Laterality: N/A;          Current Outpatient Medications  Medication Sig Dispense Refill  . acyclovir (ZOVIRAX) 800 MG tablet Take 1 tablet (800 mg total) by mouth 2 (two) times daily. 180 tablet 3  . amLODipine (NORVASC) 5 MG tablet Take 1 tablet (5 mg total) by mouth daily. 180 tablet 3  . apixaban (ELIQUIS) 5 MG TABS tablet Take 1 tablet (5 mg total) by mouth 2 (two) times daily. 180 tablet 4  . COD LIVER OIL PO Take 1 capsule by mouth 2 (two) times daily.    . diclofenac sodium (VOLTAREN) 1 % GEL Apply 4 g topically 4 (four) times daily as needed. (Patient taking differently: Apply 4 g topically 4 (four) times daily as needed (for pain). ) 300 g prn  . gabapentin (NEURONTIN) 600 MG tablet Take 1 tablet (600 mg total) by mouth 3 (three) times daily. 270 tablet 3  . latanoprost (XALATAN) 0.005 % ophthalmic solution Place 1 drop into the right eye at bedtime. 7.5 mL 0  . losartan (COZAAR) 100 MG tablet Take 1 tablet (100 mg total) by mouth daily. 30 tablet 6  . metFORMIN (GLUCOPHAGE) 850 MG tablet Take 1 tablet (850 mg total) by mouth 2 (two) times daily with a meal. 180 tablet 1  . metoprolol tartrate (LOPRESSOR) 25 MG tablet Take 0.5 tablets (12.5 mg total) by mouth 2 (two) times daily. 90 tablet 3  . nitroGLYCERIN (NITROSTAT) 0.4 MG SL tablet Place 1 tablet (0.4 mg total) under the tongue every 5 (five) minutes as needed for chest pain (max 3 doses). 25 tablet 6   No current facility-administered medications for this encounter.          Allergies  Allergen Reactions  . Fenofibrate Other (See Comments)    Muscle weakness.    . Statins Other (See Comments)    MYALGIA, muscle pain    Social History        Socioeconomic History  . Marital status: Married    Spouse name: Not on file  . Number of children: 2  . Years of education: HS  . Highest  education level: Not on file  Social Needs  . Financial resource strain: Not on file  . Food insecurity - worry: Not on file  . Food insecurity - inability: Not on file  . Transportation needs - medical: Not on file  . Transportation needs - non-medical: Not on file  Occupational History  . Occupation: Retired  Tobacco Use  . Smoking status: Never Smoker  . Smokeless tobacco: Never Used  Substance and Sexual Activity  . Alcohol use: No    Alcohol/week: 0.0 oz  . Drug use: No  . Sexual activity: Not on file  Other Topics Concern  . 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   Right-handed.   2 cups caffeine daily.         Family History  Problem Relation Age of Onset  . Stroke Mother   . Diabetes Mother   . Heart disease Father        heart failure  . Colon cancer Maternal Grandmother   . Parkinsonism Maternal Grandfather   . Stroke Paternal Grandfather   . Prostate cancer Maternal Uncle   . CAD Brother        CABG    ROS- All systems are reviewed and negative except as per the HPI above  Physical Exam:    Vitals:   09/18/17 1031  BP: (!) 168/70  Pulse: 78  Weight: 165 lb (74.8 kg)  Height: 5\' 7"  (1.702 m)      Wt Readings from Last 3 Encounters:  09/18/17 165 lb (74.8 kg)  09/15/17 163 lb 12 oz (74.3 kg)  09/07/17 163 lb (73.9 kg)    Labs: RecentLabs       Lab Results  Component Value Date   NA 140 08/16/2017   K 5.2 (H) 08/16/2017   CL 102 08/16/2017   CO2 31 08/16/2017   GLUCOSE 125 (H) 08/16/2017   BUN 14 08/16/2017   CREATININE 0.79 08/16/2017   CALCIUM 10.5 08/16/2017   PHOS 3.2 06/02/2014   MG 2.1 06/02/2014     RecentLabs       Lab Results  Component Value Date   INR 1.38 08/30/2013     RecentLabs       Lab Results  Component Value Date   CHOL 112  06/07/2017   HDL 29.60 (L) 06/07/2017   LDLCALC 62 06/07/2017   TRIG 99.0 06/07/2017       GEN- The patient is well appearing, alert and oriented x 3 today.   Head- normocephalic, atraumatic Eyes-  Sclera clear, conjunctiva pink Ears- hearing intact Oropharynx- clear Neck- supple, no JVP Lymph- no cervical lymphadenopathy Lungs- Clear to ausculation bilaterally, normal work of breathing Heart- irregular rate and rhythm, no murmurs, rubs or gallops, PMI not laterally displaced GI- soft, NT, ND, + BS Extremities- no clubbing, cyanosis, or edema MS- no significant deformity or atrophy Skin- no rash or lesion Psych- euthymic mood, full affect Neuro- strength and sensation are intact  EKG-afib with v rate of 78 bpm, qrs int 90 ms, qtc 449 ms Epic records reviewed Echo-Study Conclusions  - Left ventricle: The cavity size was normal. Wall thickness was increased in a pattern of mild LVH. Systolic function was normal. The estimated ejection fraction was in the range of 55% to 60%. Wall motion was normal; there were no regional wall motion abnormalities. Doppler parameters are consistent with abnormal left ventricular relaxation (grade 1 diastolic dysfunction). - Aortic valve: Valve mobility was mildly restricted. There was trivial regurgitation. - Aortic root: The aortic root was mildly dilated. - Right ventricle: The cavity size was mildly dilated. - Atrial septum: There was a patent foramen ovale.  Impressions:  - Normal LV systolic function; mild diastolic dysfunction; sclerotic aortic valve with trace AI; mildly dilated aortic root; mild RVE; PFO noted by color doppler.   Assessment and Plan: 1. Persistent afib  Successful cardioversion 08/2017 but ERAF Pt explained options, continue  with rate controlled afib, Tikosyn, or amiodarone, possibly ablation. Discussed with Dr. Rayann Heman, ablation,  would prefer for pt to try AAD's first He is in  clinic for Tikosyn admit, can afford generic dofetilide Drugs  screened by pharmD no qtc prolonging drugs Bmet/mag today  No missed doses of eliquis, no bendryl use Precautions with tikosyn discussed  Labs show today K+ at 4.8, mag at 2.3, crcl cal at 83.96 To be admitted later when beds are available     Butch Penny C. [facility] [address] [place], [place] [address] [phone]   EP Attending  Patient seen and examined. Agree with the findings as noted above. The patient is admitted for initiation of dofetilide. I have reviewed the indications and benefits and plan for 3 days in the hospital. If he has not reverted back to NSR will plan to DCCV on Wednesday or Thursday.  Mikle Bosworth.D.

## 2017-09-18 NOTE — Progress Notes (Signed)
Pharmacy Review for Dofetilide (Tikosyn) Initiation  Admit Complaint: 77 y.o. male admitted 09/18/2017 with atrial fibrillation to be initiated on dofetilide.   Assessment:  Patient Exclusion Criteria: If any screening criteria checked as "Yes", then  patient  should NOT receive dofetilide until criteria item is corrected. If "Yes" please indicate correction plan.  YES  NO Patient  Exclusion Criteria Correction Plan  []  [x]  Baseline QTc interval is greater than or equal to 440 msec. IF above YES box checked dofetilide contraindicated unless patient has ICD; then may proceed if QTc 500-550 msec or with known ventricular conduction abnormalities may proceed with QTc 550-600 msec. QTc = 449 ms   []  [x]  Magnesium level is less than 1.8 mEq/l : Last magnesium:  Lab Results  Component Value Date   MG 2.3 09/18/2017         []  [x]  Potassium level is less than 4 mEq/l : Last potassium:  Lab Results  Component Value Date   K 4.8 09/18/2017         []  [x]  Patient is known or suspected to have a digoxin level greater than 2 ng/ml: No results found for: DIGOXIN    []  [x]  Creatinine clearance less than 20 ml/min (calculated using Cockcroft-Gault, actual body weight and serum creatinine): Estimated Creatinine Clearance: 72.3 mL/min (by C-G formula based on SCr of 0.78 mg/dL).    []  [x]  Patient has received drugs known to prolong the QT intervals within the last 48 hours (phenothiazines, tricyclics or tetracyclic antidepressants, erythromycin, H-1 antihistamines, cisapride, fluoroquinolones, azithromycin). Drugs not listed above may have an, as yet, undetected potential to prolong the QT interval, updated information on QT prolonging agents is available at this website:QT prolonging agents   []  [x]  Patient received a dose of hydrochlorothiazide (O[place]ic) alone or in any combination including triamterene (Dyazide, Maxzide) in the last 48 hours.   []  [x]  Patient received a medication known to  increase dofetilide plasma concentrations prior to initial dofetilide dose:  . Trimethoprim (Primsol, Proloprim) in the last 36 hours . Verapamil (Calan, Verelan) in the last 36 hours or a sustained release dose in the last 72 hours . Megestrol (Megace) in the last 5 days  . Cimetidine (Tagamet) in the last 6 hours . Ketoconazole (Nizoral) in the last 24 hours . Itraconazole (Sporanox) in the last 48 hours  . Prochlorperazine (Compazine) in the last 36 hours    []  [x]  Patient is known to have a history of torsades de pointes; congenital or acquired long QT syndromes.   []  [x]  Patient has received a Class 1 antiarrhythmic with less than 2 half-lives since last dose. (Disopyramide, Quinidine, Procainamide, Lidocaine, Mexiletine, Flecainide, Propafenone)   []  [x]  Patient has received amiodarone therapy in the past 3 months or amiodarone level is greater than 0.3 ng/ml.    Patient has been appropriately anticoagulated with Eliquis.  Ordering provider was confirmed at [email or web address] if they are not listed on the [facility] Prescribers list.  Goal of Therapy: Follow renal function, electrolytes, potential drug interactions, and dose adjustment. Provide education and 1 week supply at discharge.  Plan:  [x]   Physician selected initial dose within range recommended for patients level of renal function - will monitor for response.  []   Physician selected initial dose outside of range recommended for patients level of renal function - will discuss if the dose should be altered at this time.   [facility] One Calculated CrCl  Dose q12h  [x]  > 60 ml/min 500 mcg  []   40-60 ml/min 250 mcg  []  20-40 ml/min 125 mcg   2. Follow up QTc after the first 5 doses, renal function, electrolytes (K & Mg) daily x 3     days, dose adjustment, success of initiation and facilitate 1 week discharge supply as     clinically indicated.  3. Initiate Tikosyn education video (Call [phone] and ask for Tikosyn Video #  [id number]).  4. Place Enrollment Form on the chart for discharge supply of dofetilide.   Axelle Szwed D. Mina Marble, PharmD, BCPS Pager:  [phone] 09/18/2017, 6:52 PM

## 2017-09-18 NOTE — Progress Notes (Signed)
Primary Care Physician: Tonia Ghent, MD Referring Physician: Ignacia Bayley, PA Cardiologist: Dr. [name] is a 77 y.o. male with a h/o CAD status post CABG x6 in January 2015,( required amiodarone/warfarin for a short period of time), diabetes, hypertension, hyperlipidemia, PFO, embolic CVA. In the spring 2018, he was evaluated by neurology in the setting of falls.  MRI in September 2018 showed small subacute infarction of the bilateral centrum semi-ovale consistent with a prior embolic event.  It was felt that this was most likely explained by paroxysmal atrial fibrillation and he was placed on Eliquis 5 mg twice a day.He has persistent atrial fibrillation status post cardioversion earlier this month.  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 He has noted some fatigue and irregular heart beat with his activities in afib. His afib has slow v response and his metoprolol was reduced when seen by [name] [name]. He has checked with insurance and can afford dofetilide. On last visit, 09/07/17, when antiarrythmic options were discussed,he was  not excited having to come into hospital for 4 days. Sotalol would be affordable, but would be contraindicated with pt slow at baseline. No alcohol, tobacco, minimal caffeine. He prefers not to go back on amiodarone for possible long term side effects.  Today, he denies symptoms of chest pain, shortness of breath, orthopnea, PND, lower extremity edema, dizziness, presyncope, syncope, or neurologic sequela.+ for fatigue /palpitations. The patient is tolerating medications without difficulties and is otherwise without complaint today.   Past Medical History:  Diagnosis Date  . Anxiety   . Back pain   . Coronary artery disease 08/21/2013   a. 08/2013 s/p CABG x 6.  . Diabetes mellitus   . Diabetic neuropathy ([place])   . Diastolic dysfunction    a. 02/2017 Echo: EF 55-60%, no rwma, Gr1 DD, mildly dil Ao Root/RV. + PFO.  .  Diverticulosis   . Diverticulosis   . Embolic stroke ([place])    a. 04/2017 MRI in setting of freq falls: small subacute infarction of bilat centrum semi-ovale consistent w/ embolic event-->Eliquis.  . Erectile dysfunction   . Family history of anesthesia complication    ' they cant wake my brother very easy"  . Fatty liver   . Flank pain   . GERD (gastroesophageal reflux disease)   . History of bronchitis   . History of chicken pox   . 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 Hypercholesterolemia   . Hypertension   . Kidney stones   . Osteoarthritis   . Persistent atrial fibrillation ([place])    a. Had post-op AF 08/2013; b. 09/2015 Holter: PAC's no AF; c. 05/2017 Noted to be in AFib-->Eliquis (CHA2DS2VASc = 7); d. 08/2017 s/p DCCV.  Marland Kitchen PFO (patent foramen ovale)    a. 02/2017 Echo: + PFO.   Past Surgical History:  Procedure Laterality Date  . CARDIAC CATHETERIZATION  08/21/2013   DR Angelena Form  . CARDIOVERSION N/A 08/24/2017   Procedure: CARDIOVERSION;  Surgeon: Minna Merritts, MD;  Location: [facility] ORS;  Service: Cardiovascular;  Laterality: N/A;  . CHOLECYSTECTOMY  1983  . CORONARY ARTERY BYPASS GRAFT N/A 08/23/2013   Procedure: CORONARY ARTERY BYPASS GRAFTING (CABG);  Surgeon: Grace Isaac, MD;  Location: [facility];  Service: Open Heart Surgery;  Laterality: N/A;  CABG times six utilizing the left internal mammary artery and the right greater saphenous vein harvested endoscopically  . ENDOVEIN HARVEST  OF GREATER SAPHENOUS VEIN Right 08/23/2013   Procedure: ENDOVEIN HARVEST OF GREATER SAPHENOUS VEIN;  Surgeon: Grace Isaac, MD;  Location: [facility];  Service: Open Heart Surgery;  Laterality: Right;  . HERNIA REPAIR     X3  . INTRAOPERATIVE TRANSESOPHAGEAL ECHOCARDIOGRAM N/A 08/23/2013   Procedure: INTRAOPERATIVE TRANSESOPHAGEAL ECHOCARDIOGRAM;  Surgeon: Grace Isaac, MD;  Location: [facility];  Service: Open Heart Surgery;  Laterality: N/A;  . LEFT HEART CATHETERIZATION WITH  CORONARY ANGIOGRAM N/A 08/21/2013   Procedure: LEFT HEART CATHETERIZATION WITH CORONARY ANGIOGRAM;  Surgeon: Burnell Blanks, MD;  Location: [facility] CATH LAB;  Service: Cardiovascular;  Laterality: N/A;    Current Outpatient Medications  Medication Sig Dispense Refill  . acyclovir (ZOVIRAX) 800 MG tablet Take 1 tablet (800 mg total) by mouth 2 (two) times daily. 180 tablet 3  . amLODipine (NORVASC) 5 MG tablet Take 1 tablet (5 mg total) by mouth daily. 180 tablet 3  . apixaban (ELIQUIS) 5 MG TABS tablet Take 1 tablet (5 mg total) by mouth 2 (two) times daily. 180 tablet 4  . COD LIVER OIL PO Take 1 capsule by mouth 2 (two) times daily.    . diclofenac sodium (VOLTAREN) 1 % GEL Apply 4 g topically 4 (four) times daily as needed. (Patient taking differently: Apply 4 g topically 4 (four) times daily as needed (for pain). ) 300 g prn  . gabapentin (NEURONTIN) 600 MG tablet Take 1 tablet (600 mg total) by mouth 3 (three) times daily. 270 tablet 3  . latanoprost (XALATAN) 0.005 % ophthalmic solution Place 1 drop into the right eye at bedtime. 7.5 mL 0  . losartan (COZAAR) 100 MG tablet Take 1 tablet (100 mg total) by mouth daily. 30 tablet 6  . metFORMIN (GLUCOPHAGE) 850 MG tablet Take 1 tablet (850 mg total) by mouth 2 (two) times daily with a meal. 180 tablet 1  . metoprolol tartrate (LOPRESSOR) 25 MG tablet Take 0.5 tablets (12.5 mg total) by mouth 2 (two) times daily. 90 tablet 3  . nitroGLYCERIN (NITROSTAT) 0.4 MG SL tablet Place 1 tablet (0.4 mg total) under the tongue every 5 (five) minutes as needed for chest pain (max 3 doses). 25 tablet 6   No current facility-administered medications for this encounter.     Allergies  Allergen Reactions  . Fenofibrate Other (See Comments)    Muscle weakness.    . Statins Other (See Comments)    MYALGIA, muscle pain    Social History   Socioeconomic History  . Marital status: Married    Spouse name: Not on file  . Number of children: 2  .  Years of education: HS  . Highest education level: Not on file  Social Needs  . Financial resource strain: Not on file  . Food insecurity - worry: Not on file  . Food insecurity - inability: Not on file  . Transportation needs - medical: Not on file  . Transportation needs - non-medical: Not on file  Occupational History  . Occupation: Retired  Tobacco Use  . Smoking status: Never Smoker  . Smokeless tobacco: Never Used  Substance and Sexual Activity  . Alcohol use: No    Alcohol/week: 0.0 oz  . Drug use: No  . Sexual activity: Not on file  Other Topics Concern  . 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   Right-handed.   2 cups caffeine daily.    Family History  Problem Relation Age of Onset  . Stroke Mother   . Diabetes Mother   . Heart disease Father        heart failure  . Colon cancer Maternal Grandmother   . Parkinsonism Maternal Grandfather   . Stroke Paternal Grandfather   . Prostate cancer Maternal Uncle   . CAD Brother        CABG    ROS- All systems are reviewed and negative except as per the HPI above  Physical Exam: Vitals:   09/18/17 1031  BP: (!) 168/70  Pulse: 78  Weight: 165 lb (74.8 kg)  Height: 5\' 7"  (1.702 m)   Wt Readings from Last 3 Encounters:  09/18/17 165 lb (74.8 kg)  09/15/17 163 lb 12 oz (74.3 kg)  09/07/17 163 lb (73.9 kg)    Labs: Lab Results  Component Value Date   NA 140 08/16/2017   K 5.2 (H) 08/16/2017   CL 102 08/16/2017   CO2 31 08/16/2017   GLUCOSE 125 (H) 08/16/2017   BUN 14 08/16/2017   CREATININE 0.79 08/16/2017   CALCIUM 10.5 08/16/2017   PHOS 3.2 06/02/2014   MG 2.1 06/02/2014   Lab Results  Component Value Date   INR 1.38 08/30/2013   Lab Results  Component Value Date   CHOL 112 06/07/2017   HDL 29.60 (L) 06/07/2017   LDLCALC 62 06/07/2017   TRIG 99.0 06/07/2017      GEN- The patient is well appearing, alert and oriented x 3 today.   Head- normocephalic, atraumatic Eyes-  Sclera clear, conjunctiva pink Ears- hearing intact Oropharynx- clear Neck- supple, no JVP Lymph- no cervical lymphadenopathy Lungs- Clear to ausculation bilaterally, normal work of breathing Heart- irregular rate and rhythm, no murmurs, rubs or gallops, PMI not laterally displaced GI- soft, NT, ND, + BS Extremities- no clubbing, cyanosis, or edema MS- no significant deformity or atrophy Skin- no rash or lesion Psych- euthymic mood, full affect Neuro- strength and sensation are intact  EKG-afib with v rate of 78 bpm, qrs int 90 ms, qtc 449 ms Epic records reviewed Echo-Study Conclusions  - Left ventricle: The cavity size was normal. Wall thickness was   increased in a pattern of mild LVH. Systolic function was normal.   The estimated ejection fraction was in the range of 55% to 60%.   Wall motion was normal; there were no regional wall motion   abnormalities. Doppler parameters are consistent with abnormal   left ventricular relaxation (grade 1 diastolic dysfunction). - Aortic valve: Valve mobility was mildly restricted. There was   trivial regurgitation. - Aortic root: The aortic root was mildly dilated. - Right ventricle: The cavity size was mildly dilated. - Atrial septum: There was a patent foramen ovale.  Impressions:  - Normal LV systolic function; mild diastolic dysfunction;   sclerotic aortic valve with trace AI; mildly dilated aortic root;   mild RVE; PFO noted by color doppler.   Assessment and Plan: 1. Persistent afib  Successful cardioversion 08/2017 but ERAF Pt explained options, continue  with rate controlled afib, Tikosyn, or amiodarone, possibly ablation. Discussed with Dr. Rayann Heman, ablation,  would prefer for pt to try AAD's first He is in clinic for Tikosyn admit, can afford generic dofetilide Drugs screened by pharmD no qtc prolonging  drugs Bmet/mag today  No missed doses of eliquis, no bendryl use Precautions with tikosyn discussed  Labs show today  K+ at 4.8, mag at 2.3, crcl cal at 83.96 To be admitted later when beds are available     [name] [name], [facility] [address] [place], [place] [address] [phone]

## 2017-09-18 NOTE — Progress Notes (Signed)
JERRIS FLEER is a 77 y.o. male patient admitted from Home awake, alert - oriented  X 4 - no acute distress noted.  VSS - Blood pressure (!) 160/85, pulse 65, temperature 98.1 F (36.7 C), temperature source Oral, SpO2 97 %.    IV placed  in Left anterior Forearm, occlusive dsg intact without redness.  Orientation to room, and floor completed with information packet given to patient/family.  Patient declined safety video at this time.  Admission INP armband ID verified with patient/family, and in place.   SR up x 2, fall assessment complete, with patient and family able to verbalize understanding of risk associated with falls, and verbalized understanding to call nsg before up out of bed.  Call light within reach, patient able to voice, and demonstrate understanding.  Skin, clean-dry- intact without evidence of bruising, or skin tears.   No evidence of skin break down noted on exam.     Will cont to eval and treat per MD orders.  Milas Hock, RN 09/18/2017 4:14 PM

## 2017-09-19 ENCOUNTER — Other Ambulatory Visit: Payer: Self-pay

## 2017-09-19 LAB — BASIC METABOLIC PANEL
Anion gap: 11 (ref 5–15)
BUN: 14 mg/dL (ref 6–20)
CHLORIDE: 105 mmol/L (ref 101–111)
CO2: 23 mmol/L (ref 22–32)
CREATININE: 0.77 mg/dL (ref 0.61–1.24)
Calcium: 9.3 mg/dL (ref 8.9–10.3)
GFR calc Af Amer: >60 mL/min (ref >60)
GFR calc non Af Amer: >60 mL/min (ref >60)
GLUCOSE: 142 mg/dL — AB (ref 65–99)
Potassium: 4.1 mmol/L (ref 3.5–5.1)
Sodium: 139 mmol/L (ref 135–145)

## 2017-09-19 LAB — MAGNESIUM: Magnesium: 2.2 mg/dL (ref 1.7–2.4)

## 2017-09-19 MED ORDER — METOPROLOL TARTRATE 12.5 MG HALF TABLET
12.5000 mg | ORAL_TABLET | Freq: Two times a day (BID) | ORAL | Status: DC
  Administered 2017-09-20 – 2017-09-21 (×3): 12.5 mg via ORAL
  Filled 2017-09-19 (×3): qty 1

## 2017-09-19 NOTE — Care Management Note (Addendum)
Case Management Note  Patient Details  Name: [name] [name] MRN: [id number] Date of Birth: 1940/12/08  Subjective/Objective: Pt presented for Tikosyn Load-Persistent Atrial Fib. Benefits Check completed for Tikosyn.                    Action/Plan: Pt will need a Rx for the 14 day supply no refills and the original Rx to be 90 day with refills. CM will assist with 14 day supply via [place]. Pt would like for his Rx to be e-scribed to [facility] for 90 day supply.  S/W REYNA A. [employer] RX # [phone] OPT- 1    1. TIKOSYN 500 MCG BID  COVER- NONE FORMULARY AND NOT ON PATIENT DRUG LIST  PRIOR APPROVAL- YES # [phone] OPT- 5 FOR EXCEPTION   2. DOFETILIDE 500 MCG BID  COVER- YES  CO-PAY- 45 % OF TOTAL COST  TIER- 4 DRUG  PRIOR APPROVAL- NO  DEDUCTIBLE: MET   M/O AND RETAIL FOR 90 DAY SUPPLY 50 % OF TOTAL COST  PREFERRED PHARMACY : [facility]    Expected Discharge Date:                  Expected Discharge Plan:  Home/Self Care  In-House Referral:  NA  Discharge planning Services  CM Consult, Medication Assistance  Post Acute Care Choice:  NA Choice offered to:  NA  DME Arranged:  N/A DME Agency:  NA  HH Arranged:  NA HH Agency:  NA  Status of Service:  Completed, signed off  If discussed at [place] of Stay Meetings, dates discussed:    Additional Comments:  Bethena Roys, RN 09/19/2017, 12:37 PM

## 2017-09-19 NOTE — Progress Notes (Signed)
   Electrophysiology Rounding Note  Patient Name: [name] [name] Date of Encounter: 09/19/2017  Primary Cardiologist: Rockey Situ Electrophysiologist: [name] (new this admission)   Subjective   The patient is doing well today.  At this time, the patient denies chest pain, shortness of breath, or any new concerns. He feels better in SR  Inpatient Medications    Scheduled Meds: . acyclovir  800 mg Oral BID  . amLODipine  5 mg Oral Daily  . apixaban  5 mg Oral BID  . dofetilide  500 mcg Oral BID  . gabapentin  600 mg Oral TID  . latanoprost  1 drop Right Eye QHS  . losartan  100 mg Oral QHS  . metFORMIN  850 mg Oral BID WC  . metoprolol tartrate  12.5 mg Oral BID  . sodium chloride flush  3 mL Intravenous Q12H   Continuous Infusions: . sodium chloride     PRN Meds: sodium chloride, nitroGLYCERIN, sodium chloride flush   Vital Signs    Vitals:   09/18/17 1559 09/18/17 2001 09/18/17 2300 09/19/17 0650  BP: (!) 160/85 (!) 163/87 132/68 (!) 148/76  Pulse: 65 (!) 52  (!) 59  Resp:  17 16 17   Temp: 98.1 F (36.7 C) (!) 97.4 F (36.3 C)  98 F (36.7 C)  TempSrc: Oral Oral  Oral  SpO2: 97% 97%  97%  Weight:    161 lb 3.2 oz (73.1 kg)   No intake or output data in the 24 hours ending 09/19/17 0758 Filed Weights   09/19/17 0650  Weight: 161 lb 3.2 oz (73.1 kg)    Physical Exam    GEN- The patient is well appearing, alert and oriented x 3 today.   Head- normocephalic, atraumatic Eyes-  Sclera clear, conjunctiva pink Ears- hearing intact Oropharynx- clear Neck- supple Lungs- Clear to ausculation bilaterally, normal work of breathing Heart- Regular rate and rhythm, no murmurs, rubs or gallops GI- soft, NT, ND, + BS Extremities- no clubbing, cyanosis, or edema Skin- no rash or lesion Psych- euthymic mood, full affect Neuro- strength and sensation are intact  Labs    CBC No results for input(s): WBC, NEUTROABS, HGB, HCT, MCV, PLT in the last 72 hours. Basic Metabolic  Panel Recent Labs    09/18/17 1026 09/19/17 0356  NA 140 139  K 4.8 4.1  CL 103 105  CO2 27 23  GLUCOSE 121* 142*  BUN 12 14  CREATININE 0.78 0.77  CALCIUM 9.7 9.3  MG 2.3 2.2     Telemetry    Sinus rhythm (personally reviewed)  Radiology    No results found.   Patient Profile     WILLLIAM [name] is a 77 y.o. male admitted for tikosyn load.   Assessment & Plan    1.  Persistent atrial fibrillation Admitted for Tikosyn load Converted after 1st dose Labs, EKG stable Continue [place] long term  Signed, Chanetta Marshall, NP  09/19/2017, 7:58 AM   EP Attending  Patient seen and examined. Agree with the findings as noted above. The patient has reverted back to NSR today. His ECG reveals sinus brady with satisfactory QT intervals. Will continue dofetilide and monitor his QT interval.  Mikle Bosworth.D.

## 2017-09-20 ENCOUNTER — Encounter (HOSPITAL_COMMUNITY): Payer: Self-pay | Admitting: *Deleted

## 2017-09-20 ENCOUNTER — Encounter (HOSPITAL_COMMUNITY)
Admission: RE | Disposition: A | Discharge status: Home / Self Care | Payer: Self-pay | Source: Ambulatory Visit | Attending: Internal Medicine

## 2017-09-20 LAB — BASIC METABOLIC PANEL
Anion gap: 12 (ref 5–15)
BUN: 12 mg/dL (ref 6–20)
CALCIUM: 9.4 mg/dL (ref 8.9–10.3)
CO2: 23 mmol/L (ref 22–32)
CREATININE: 0.86 mg/dL (ref 0.61–1.24)
Chloride: 105 mmol/L (ref 101–111)
GFR calc non Af Amer: >60 mL/min (ref >60)
Glucose, Bld: 168 mg/dL — ABNORMAL HIGH (ref 65–99)
Potassium: 4.1 mmol/L (ref 3.5–5.1)
SODIUM: 140 mmol/L (ref 135–145)

## 2017-09-20 LAB — GLUCOSE, CAPILLARY: Glucose-Capillary: 140 mg/dL — ABNORMAL HIGH (ref 65–99)

## 2017-09-20 LAB — MAGNESIUM: Magnesium: 2.1 mg/dL (ref 1.7–2.4)

## 2017-09-20 SURGERY — CARDIOVERSION
Anesthesia: General

## 2017-09-20 NOTE — Discharge Instructions (Addendum)
You have an appointment set up with the Atrial Fibrillation Clinic.  Multiple studies have shown that being followed by a dedicated atrial fibrillation clinic in addition to the standard care you receive from your other physicians improves health. We believe that enrollment in the atrial fibrillation clinic will allow us to better care for you.  ° °The phone number to the Atrial Fibrillation Clinic is [phone]. The clinic is staffed Monday through Friday from 8:30am to 5pm. ° °Parking Directions: The clinic is located in the Heart and Vascular Building connected to [facility]. °1)From [address] turn on to [address] and go to the 3rd entrance  (Heart and Vascular entrance) on the right. °2)Look to the right for Heart &Vascular Parking Garage. °3)A code for the entrance is required please call the clinic to receive this.   °4)Take the elevators to the 1st floor. Registration is in the room with the glass walls at the end of the hallway. ° °If you have any trouble parking or locating the clinic, please don’t hesitate to call [phone]. °Information on my medicine - ELIQUIS® (apixaban) ° °Why was Eliquis® prescribed for you? °Eliquis® was prescribed for you to reduce the risk of a blood clot forming that can cause a stroke if you have a medical condition called atrial fibrillation (a type of irregular heartbeat). ° °What do You need to know about Eliquis® ? °Take your Eliquis® TWICE DAILY - one tablet in the morning and one tablet in the evening with or without food. If you have difficulty swallowing the tablet whole please discuss with your pharmacist how to take the medication safely. ° °Take Eliquis® exactly as prescribed by your doctor and DO NOT stop taking Eliquis® without talking to the doctor who prescribed the medication.  Stopping may increase your risk of developing a stroke.  Refill your prescription before you run out. ° °After discharge, you should have regular check-up  appointments with your healthcare provider that is prescribing your Eliquis®.  In the future your dose may need to be changed if your kidney function or weight changes by a significant amount or as you get older. ° °What do you do if you miss a dose? °If you miss a dose, take it as soon as you remember on the same day and resume taking twice daily.  Do not take more than one dose of ELIQUIS at the same time to make up a missed dose. ° °Important Safety Information °A possible side effect of Eliquis® is bleeding. You should call your healthcare provider right away if you experience any of the following: °? Bleeding from an injury or your nose that does not stop. °? Unusual colored urine (red or dark brown) or unusual colored stools (red or black). °? Unusual bruising for unknown reasons. °? A serious fall or if you hit your head (even if there is no bleeding). ° °Some medicines may interact with Eliquis® and might increase your risk of bleeding or clotting while on Eliquis®. To help avoid this, consult your healthcare provider or pharmacist prior to using any new prescription or non-prescription medications, including herbals, vitamins, non-steroidal anti-inflammatory drugs (NSAIDs) and supplements. ° °This website has more information on Eliquis® (apixaban): http://www.eliquis.com/eliquis/home ° °

## 2017-09-20 NOTE — Discharge Summary (Signed)
ELECTROPHYSIOLOGY PROCEDURE DISCHARGE SUMMARY    Patient ID: [name] [name],  MRN: [id number], DOB/AGE: 02-08-1941 77 y.o.  Admit date: 09/18/2017 Discharge date: 09/21/17  Primary Care Physician: Tonia Ghent, MD  Primary Cardiologist: Dr. Rockey Situ Electrophysiologist: r. Charlott Calvario  Primary Discharge Diagnosis:  1.  Persistentatrial fibrillation status post Tikosyn loading this admission      CHA2Ds2vasc is 6, on Eliquis  Secondary Discharge Diagnosis:  1. HTN 2. DM 3. CVA (old)   Allergies  Allergen Reactions  . Fenofibrate Other (See Comments)    Muscle weakness.    . Statins Other (See Comments)    MYALGIA, muscle pain     Procedures This Admission:  1.  Tikosyn loading    Brief HPI: [name] [name] is a 77 y.o. male with a past medical history as noted above.  He is followed out patient by EP and AFib clinic, discussion regarding treatment options of atrial fibrillation were held, and decided to pursue Tikosyn prior to consideration of ablation.  Risks, benefits, and alternatives to Tikosyn were reviewed with the patient who wished to proceed.    Hospital Course:  The patient was admitted and Tikosyn was initiated.  Renal function and electrolytes were followed during the hospitalization.  His QTc remained stable.  He converted with drug and did not required DCCV.   The patient was monitored until discharge on telemetry which demonstrated SR 60's, occ PACs.  On the day of discharge, he was examined by Dr [name] who considered the patient stable for discharge to home.  Follow-up has been arranged with the AFib clinic in 1 week and with Dr [name]  in 4 weeks.   patient will be provided 14 day supply given pharmacy is mail order.  Physical Exam: Vitals:   09/20/17 1553 09/20/17 1956 09/21/17 0454 09/21/17 0909  BP: (!) 152/81 (!) 143/74 (!) 153/88 (!) 159/77  Pulse: (!) 49 70 62 62  Resp:  15 17   Temp: 98.4 F (36.9 C) 98.4 F (36.9 C) 98.2 F (36.8 C)   TempSrc:  Oral Oral Oral   SpO2: 96% 97% 96%   Weight:   159 lb 12.8 oz (72.5 kg)   Height:        GEN- The patient is well appearing, alert and oriented x 3 today.   HEENT: normocephalic, atraumatic; sclera clear, conjunctiva pink; hearing intact; oropharynx clear; neck supple, no JVP Lymph- no cervical lymphadenopathy Lungs- CTA b/l, normal work of breathing.  No wheezes, rales, rhonchi Heart- RRR, no murmurs, rubs or gallops, PMI not laterally displaced GI- soft, non-tender, non-distended Extremities- no clubbing, cyanosis, or edema MS- no significant deformity or atrophy Skin- warm and dry, no rash or lesion Psych- euthymic mood, full affect Neuro- strength and sensation are intact   Labs:   Lab Results  Component Value Date   WBC 7.0 08/16/2017   HGB 16.2 08/16/2017   HCT 48.9 08/16/2017   MCV 94.2 08/16/2017   PLT 169.0 08/16/2017    Recent Labs  Lab 09/21/17 0541  NA 140  K 4.1  CL 103  CO2 27  BUN 12  CREATININE 0.80  CALCIUM 9.9  GLUCOSE 184*     Discharge Medications:  Allergies as of 09/21/2017      Reactions   Fenofibrate Other (See Comments)   Muscle weakness.     Statins Other (See Comments)   MYALGIA, muscle pain      Medication List    TAKE these medications  acyclovir 800 MG tablet Commonly known as:  ZOVIRAX Take 1 tablet (800 mg total) by mouth 2 (two) times daily.   amLODipine 5 MG tablet Commonly known as:  NORVASC Take 1 tablet (5 mg total) by mouth daily.   apixaban 5 MG Tabs tablet Commonly known as:  ELIQUIS Take 1 tablet (5 mg total) by mouth 2 (two) times daily.   COD LIVER OIL PO Take 1 capsule by mouth 2 (two) times daily.   diclofenac sodium 1 % Gel Commonly known as:  VOLTAREN Apply 4 g topically 4 (four) times daily as needed. What changed:  reasons to take this   dofetilide 500 MCG capsule Commonly known as:  TIKOSYN Take 1 capsule (500 mcg total) by mouth 2 (two) times daily.   gabapentin 600 MG tablet Commonly  known as:  NEURONTIN Take 1 tablet (600 mg total) by mouth 3 (three) times daily.   latanoprost 0.005 % ophthalmic solution Commonly known as:  XALATAN Place 1 drop into the right eye at bedtime.   losartan 100 MG tablet Commonly known as:  COZAAR Take 1 tablet (100 mg total) by mouth daily.   metFORMIN 850 MG tablet Commonly known as:  GLUCOPHAGE Take 1 tablet (850 mg total) by mouth 2 (two) times daily with a meal.   metoprolol tartrate 25 MG tablet Commonly known as:  LOPRESSOR Take 0.5 tablets (12.5 mg total) by mouth 2 (two) times daily.   nitroGLYCERIN 0.4 MG SL tablet Commonly known as:  NITROSTAT Place 1 tablet (0.4 mg total) under the tongue every 5 (five) minutes as needed for chest pain (max 3 doses).       Disposition:  Home Discharge Instructions    Diet - low sodium heart healthy   Complete by:  As directed    Increase activity slowly   Complete by:  As directed      Follow-up Information    [facility] [place] Follow up on 09/28/2017.   Specialty:  Cardiology Why:  10:00AM Contact information: [address] [id number] [facility] [address] [place]       Evans Lance, MD Follow up on 10/24/2017.   Specialty:  Cardiology Why:  3:15PM Contact information: [address] [address] [phone]           Duration of Discharge Encounter: Greater than 30 minutes including physician time.  Venetia Night, PA-C 09/21/2017 12:14 PM  EP Attending  Patient seen and examined. Agree with above. He continues to maintain NSR and his QT interval is ok. He is stable for DC home. Usual followup.  Mikle Bosworth.D.

## 2017-09-20 NOTE — Progress Notes (Signed)
Dr. Milbert Coulter stated to hold patients Metoprolol dose for tonight since HR is less than 60.

## 2017-09-20 NOTE — Progress Notes (Signed)
Dr.Vedre-Cone returned page in regards to patient insisting on getting metoprolol.  Dr.Vedre-C[facility] informed me that patient is not to receive Metoprolol when HR is less than 60.

## 2017-09-20 NOTE — Progress Notes (Signed)
   Electrophysiology Rounding Note  Patient Name: [name] [name] Date of Encounter: 09/20/2017  Primary Cardiologist: Rockey Situ Electrophysiologist: [name]   Subjective   The patient is doing well today.  At this time, the patient denies chest pain, shortness of breath, or any new concerns.  Inpatient Medications    Scheduled Meds: . acyclovir  800 mg Oral BID  . amLODipine  5 mg Oral Daily  . apixaban  5 mg Oral BID  . dofetilide  500 mcg Oral BID  . gabapentin  600 mg Oral TID  . latanoprost  1 drop Right Eye QHS  . losartan  100 mg Oral QHS  . metFORMIN  850 mg Oral BID WC  . metoprolol tartrate  12.5 mg Oral BID  . sodium chloride flush  3 mL Intravenous Q12H   Continuous Infusions: . sodium chloride     PRN Meds: sodium chloride, nitroGLYCERIN, sodium chloride flush   Vital Signs    Vitals:   09/19/17 1340 09/19/17 1740 09/19/17 2022 09/20/17 0448  BP: (!) 141/69 (!) 164/77 (!) 148/73 (!) 149/79  Pulse: 60  (!) 59 (!) 59  Resp:   (!) 21 17  Temp: 98.4 F (36.9 C)  98.4 F (36.9 C) 98 F (36.7 C)  TempSrc: Oral  Oral Oral  SpO2: 96% 97% 97% 95%  Weight:    161 lb 9.6 oz (73.3 kg)    Intake/Output Summary (Last 24 hours) at 09/20/2017 0625 Last data filed at 09/19/2017 1806 Gross per 24 hour  Intake 720 ml  Output -  Net 720 ml   Filed Weights   09/19/17 0650 09/20/17 0448  Weight: 161 lb 3.2 oz (73.1 kg) 161 lb 9.6 oz (73.3 kg)    Physical Exam    GEN- The patient is well appearing, alert and oriented x 3 today.   Head- normocephalic, atraumatic Eyes-  Sclera clear, conjunctiva pink Ears- hearing intact Oropharynx- clear Neck- supple Lungs- Clear to ausculation bilaterally, normal work of breathing Heart- Regular rate and rhythm  GI- soft, NT, ND, + BS Extremities- no clubbing, cyanosis, or edema Skin- no rash or lesion Psych- euthymic mood, full affect Neuro- strength and sensation are intact  Labs    CBC No results for input(s): WBC,  NEUTROABS, HGB, HCT, MCV, PLT in the last 72 hours. Basic Metabolic Panel Recent Labs    09/19/17 0356 09/20/17 0423  NA 139 140  K 4.1 4.1  CL 105 105  CO2 23 23  GLUCOSE 142* 168*  BUN 14 12  CREATININE 0.77 0.86  CALCIUM 9.3 9.4  MG 2.2 2.1     Telemetry    SR (personally reviewed)  Radiology    No results found.   Patient Profile     [name] [name] is a 77 y.o. male admitted for Tikosyn load   Assessment & Plan    1.  Persistent atrial fibrillation Converted to SR after 1st dose of Tikosyn Labs, QTc stable Continue OAC long term  2.  Sinus bradycardia Asymptomatic Pt on Metoprolol at home Will give today and follow heart rates - no significant bradycardia seen   Plan DC to home tomorrow if QTc remains stable  Signed, Chanetta Marshall, NP  09/20/2017, 6:25 AM   EP Attending  Patient seen and examined. Agree with above. The patient is doing well and maintaining NSR on dofetilide. He will continue his current dose. QTC is acceptable. No arrhythmias. DC in a.m. If QTC is ok.  Mikle Bosworth.D.

## 2017-09-21 LAB — BASIC METABOLIC PANEL
Anion gap: 10 (ref 5–15)
BUN: 12 mg/dL (ref 6–20)
CHLORIDE: 103 mmol/L (ref 101–111)
CO2: 27 mmol/L (ref 22–32)
CREATININE: 0.8 mg/dL (ref 0.61–1.24)
Calcium: 9.9 mg/dL (ref 8.9–10.3)
GFR calc Af Amer: >60 mL/min (ref >60)
GFR calc non Af Amer: >60 mL/min (ref >60)
GLUCOSE: 184 mg/dL — AB (ref 65–99)
Potassium: 4.1 mmol/L (ref 3.5–5.1)
SODIUM: 140 mmol/L (ref 135–145)

## 2017-09-21 LAB — MAGNESIUM: Magnesium: 2.1 mg/dL (ref 1.7–2.4)

## 2017-09-21 MED ORDER — DOFETILIDE 500 MCG PO CAPS: 500.0000 ug | ORAL_CAPSULE | Freq: Two times a day (BID) | ORAL | 3 refills | Status: DC

## 2017-09-21 NOTE — Progress Notes (Signed)
Patient received discharge information and acknowledged understanding of it. Patient received tikosyn from pharmacy. Patient IV was removed.

## 2017-09-28 ENCOUNTER — Encounter (HOSPITAL_COMMUNITY): Payer: Self-pay | Admitting: Nurse Practitioner

## 2017-09-28 ENCOUNTER — Ambulatory Visit (HOSPITAL_COMMUNITY)
Admit: 2017-09-28 | Discharge: 2017-09-28 | Disposition: A | Discharge status: Home / Self Care | Payer: Medicare Other | Source: Ambulatory Visit | Attending: Nurse Practitioner | Admitting: Nurse Practitioner

## 2017-09-28 VITALS — BP 152/80 | HR 58 | Ht 67.0 in | Wt 167.6 lb

## 2017-09-28 DIAGNOSIS — Z888 Allergy status to other drugs, medicaments and biological substances status: Secondary | ICD-10-CM | POA: Diagnosis not present

## 2017-09-28 DIAGNOSIS — Z8673 Personal history of transient ischemic attack (TIA), and cerebral infarction without residual deficits: Secondary | ICD-10-CM | POA: Insufficient documentation

## 2017-09-28 DIAGNOSIS — I4819 Other persistent atrial fibrillation: Secondary | ICD-10-CM

## 2017-09-28 DIAGNOSIS — K219 Gastro-esophageal reflux disease without esophagitis: Secondary | ICD-10-CM | POA: Diagnosis not present

## 2017-09-28 DIAGNOSIS — Z823 Family history of stroke: Secondary | ICD-10-CM | POA: Insufficient documentation

## 2017-09-28 DIAGNOSIS — Q211 Atrial septal defect: Secondary | ICD-10-CM | POA: Insufficient documentation

## 2017-09-28 DIAGNOSIS — Z7901 Long term (current) use of anticoagulants: Secondary | ICD-10-CM | POA: Diagnosis not present

## 2017-09-28 DIAGNOSIS — E1140 Type 2 diabetes mellitus with diabetic neuropathy, unspecified: Secondary | ICD-10-CM | POA: Insufficient documentation

## 2017-09-28 DIAGNOSIS — E7800 Pure hypercholesterolemia, unspecified: Secondary | ICD-10-CM | POA: Insufficient documentation

## 2017-09-28 DIAGNOSIS — Z7984 Long term (current) use of oral hypoglycemic drugs: Secondary | ICD-10-CM | POA: Insufficient documentation

## 2017-09-28 DIAGNOSIS — E785 Hyperlipidemia, unspecified: Secondary | ICD-10-CM | POA: Insufficient documentation

## 2017-09-28 DIAGNOSIS — I2510 Atherosclerotic heart disease of native coronary artery without angina pectoris: Secondary | ICD-10-CM | POA: Diagnosis not present

## 2017-09-28 DIAGNOSIS — Z8249 Family history of ischemic heart disease and other diseases of the circulatory system: Secondary | ICD-10-CM | POA: Insufficient documentation

## 2017-09-28 DIAGNOSIS — Z79899 Other long term (current) drug therapy: Secondary | ICD-10-CM | POA: Insufficient documentation

## 2017-09-28 DIAGNOSIS — I10 Essential (primary) hypertension: Secondary | ICD-10-CM | POA: Insufficient documentation

## 2017-09-28 DIAGNOSIS — Z833 Family history of diabetes mellitus: Secondary | ICD-10-CM | POA: Insufficient documentation

## 2017-09-28 DIAGNOSIS — I481 Persistent atrial fibrillation: Secondary | ICD-10-CM

## 2017-09-28 DIAGNOSIS — M1990 Unspecified osteoarthritis, unspecified site: Secondary | ICD-10-CM | POA: Diagnosis not present

## 2017-09-28 DIAGNOSIS — Z951 Presence of aortocoronary bypass graft: Secondary | ICD-10-CM | POA: Diagnosis not present

## 2017-09-28 DIAGNOSIS — I4891 Unspecified atrial fibrillation: Secondary | ICD-10-CM | POA: Diagnosis present

## 2017-09-28 DIAGNOSIS — Z87442 Personal history of urinary calculi: Secondary | ICD-10-CM | POA: Diagnosis not present

## 2017-09-28 LAB — BASIC METABOLIC PANEL
Anion gap: 10 (ref 5–15)
BUN: 10 mg/dL (ref 6–20)
CHLORIDE: 102 mmol/L (ref 101–111)
CO2: 28 mmol/L (ref 22–32)
Calcium: 9.9 mg/dL (ref 8.9–10.3)
Creatinine, Ser: 0.72 mg/dL (ref 0.61–1.24)
GFR calc Af Amer: >60 mL/min (ref >60)
GFR calc non Af Amer: >60 mL/min (ref >60)
GLUCOSE: 157 mg/dL — AB (ref 65–99)
Potassium: 4.1 mmol/L (ref 3.5–5.1)
Sodium: 140 mmol/L (ref 135–145)

## 2017-09-28 LAB — MAGNESIUM: Magnesium: 2.1 mg/dL (ref 1.7–2.4)

## 2017-09-28 NOTE — Progress Notes (Signed)
Primary Care Physician: [name] Ghent, MD Referring Physician: Ignacia Bayley, PA Cardiologist: Dr. [name] is a 77 y.o. male with a h/o CAD status post CABG x6 in January 2015,( required amiodarone/warfarin for a short period of time), diabetes, hypertension, hyperlipidemia, PFO, embolic CVA. In the spring 2018, he was evaluated by neurology in the setting of falls.  MRI in September 2018 showed small subacute infarction of the bilateral centrum semi-ovale consistent with a prior embolic event.  It was felt that this was most likely explained by paroxysmal atrial fibrillation and he was placed on Eliquis 5 mg twice a day.He has persistent atrial fibrillation status post cardioversion earlier this month.  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 He has noted some fatigue and irregular heart beat with his activities in afib. His afib has slow v response and his metoprolol was reduced when seen by [name] [name]. He has checked with insurance and can afford dofetilide. On last visit, 09/07/17, when antiarrythmic options were discussed,he was  not excited having to come into hospital for 4 days. Sotalol would be affordable, but would be contraindicated with pt slow at baseline. No alcohol, tobacco, minimal caffeine. He prefers not to go back on amiodarone for possible long term side effects.  Today, he denies symptoms of chest pain, shortness of breath, orthopnea, PND, lower extremity edema, dizziness, presyncope, syncope, or neurologic sequela.+ for fatigue /palpitations. The patient is tolerating medications without difficulties and is otherwise without complaint today.   Past Medical History:  Diagnosis Date  . Anxiety   . Back pain   . Coronary artery disease 08/21/2013   a. 08/2013 s/p CABG x 6.  . Diabetes mellitus   . Diabetic neuropathy ([name] [name])   . Diastolic dysfunction    a. 02/2017 Echo: EF 55-60%, no rwma, Gr1 DD, mildly dil Ao Root/RV. + PFO.  .  Diverticulosis   . Diverticulosis   . Embolic stroke ([name] [name])    a. 04/2017 MRI in setting of freq falls: small subacute infarction of bilat centrum semi-ovale consistent w/ embolic event-->Eliquis.  . Erectile dysfunction   . Family history of anesthesia complication    ' they cant wake my brother very easy"  . Fatty liver   . Flank pain   . GERD (gastroesophageal reflux disease)   . History of bronchitis   . History of chicken pox   . 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 Hypercholesterolemia   . Hypertension   . Kidney stones   . Osteoarthritis   . Persistent atrial fibrillation ([name] [name])    a. Had post-op AF 08/2013; b. 09/2015 Holter: PAC's no AF; c. 05/2017 Noted to be in AFib-->Eliquis (CHA2DS2VASc = 7); d. 08/2017 s/p DCCV.  Marland Kitchen PFO (patent foramen ovale)    a. 02/2017 Echo: + PFO.   Past Surgical History:  Procedure Laterality Date  . CARDIAC CATHETERIZATION  08/21/2013   DR [name] [name]  . CARDIOVERSION N/A 08/24/2017   Procedure: CARDIOVERSION;  Surgeon: [name] Merritts, MD;  Location: [facility]S;  Service: Cardiovascular;  Laterality: N/A;  . CHOLECYSTECTOMY  1983  . CORONARY ARTERY BYPASS GRAFT N/A 08/23/2013   Procedure: CORONARY ARTERY BYPASS GRAFTING (CABG);  Surgeon: [name] Isaac, MD;  Location: [place];  Service: Open Heart Surgery;  Laterality: N/A;  CABG times six utilizing the left internal mammary artery and the right greater saphenous vein harvested endoscopically  . ENDOVEIN HARVEST  OF GREATER SAPHENOUS VEIN Right 08/23/2013   Procedure: ENDOVEIN HARVEST OF GREATER SAPHENOUS VEIN;  Surgeon: [name] Isaac, MD;  Location: [place];  Service: Open Heart Surgery;  Laterality: Right;  . HERNIA REPAIR     X3  . INTRAOPERATIVE TRANSESOPHAGEAL ECHOCARDIOGRAM N/A 08/23/2013   Procedure: INTRAOPERATIVE TRANSESOPHAGEAL ECHOCARDIOGRAM;  Surgeon: [name] Isaac, MD;  Location: [place];  Service: Open Heart Surgery;  Laterality: N/A;  . LEFT HEART CATHETERIZATION WITH  CORONARY ANGIOGRAM N/A 08/21/2013   Procedure: LEFT HEART CATHETERIZATION WITH CORONARY ANGIOGRAM;  Surgeon: Burnell Blanks, MD;  Location: [facility] C[facility] LAB;  Service: Cardiovascular;  Laterality: N/A;    Current Outpatient Medications  Medication Sig Dispense Refill  . acyclovir (ZOVIRAX) 800 MG tablet Take 1 tablet (800 mg total) by mouth 2 (two) times daily. 180 tablet 3  . amLODipine (NORVASC) 5 MG tablet Take 1 tablet (5 mg total) by mouth daily. 180 tablet 3  . apixaban (ELIQUIS) 5 MG TABS tablet Take 1 tablet (5 mg total) by mouth 2 (two) times daily. 180 tablet 4  . COD LIVER OIL PO Take 1 capsule by mouth 2 (two) times daily.    . diclofenac sodium (VOLTAREN) 1 % GEL Apply 4 g topically 4 (four) times daily as needed. (Patient taking differently: Apply 4 g topically 4 (four) times daily as needed (for pain). ) 300 g prn  . dofetilide (TIKOSYN) 500 MCG capsule Take 1 capsule (500 mcg total) by mouth 2 (two) times daily. 180 capsule 3  . gabapentin (NEURONTIN) 600 MG tablet Take 1 tablet (600 mg total) by mouth 3 (three) times daily. 270 tablet 3  . latanoprost (XALATAN) 0.005 % ophthalmic solution Place 1 drop into the right eye at bedtime. 7.5 mL 0  . losartan (COZAAR) 100 MG tablet Take 1 tablet (100 mg total) by mouth daily. 30 tablet 6  . metFORMIN (GLUCOPHAGE) 850 MG tablet Take 1 tablet (850 mg total) by mouth 2 (two) times daily with a meal. 180 tablet 1  . metoprolol tartrate (LOPRESSOR) 25 MG tablet Take 0.5 tablets (12.5 mg total) by mouth 2 (two) times daily. 90 tablet 3  . nitroGLYCERIN (NITROSTAT) 0.4 MG SL tablet Place 1 tablet (0.4 mg total) under the tongue every 5 (five) minutes as needed for chest pain (max 3 doses). 25 tablet 6   No current facility-administered medications for this encounter.     Allergies  Allergen Reactions  . Fenofibrate Other (See Comments)    Muscle weakness.    . Statins Other (See Comments)    MYALGIA, muscle pain    Social History     Socioeconomic History  . Marital status: Married    Spouse name: Not on file  . Number of children: 2  . Years of education: HS  . Highest education level: Not on file  Social Needs  . Financial resource strain: Not on file  . Food insecurity - worry: Not on file  . Food insecurity - inability: Not on file  . Transportation needs - medical: Not on file  . Transportation needs - non-medical: Not on file  Occupational History  . Occupation: Retired  Tobacco Use  . Smoking status: Never Smoker  . Smokeless tobacco: Never Used  Substance and Sexual Activity  . Alcohol use: No    Alcohol/week: 0.0 oz  . Drug use: No  . Sexual activity: Not on file  Other Topics Concern  . 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   Right-handed.   2 cups caffeine daily.    Family History  Problem Relation Age of Onset  . Stroke Mother   . Diabetes Mother   . Heart disease Father        heart failure  . Colon cancer Maternal Grandmother   . Parkinsonism Maternal Grandfather   . Stroke Paternal Grandfather   . Prostate cancer Maternal Uncle   . CAD Brother        CABG    ROS- All systems are reviewed and negative except as per the HPI above  Physical Exam: Vitals:   09/28/17 0954  BP: (!) 152/80  Pulse: (!) 58  Weight: 167 lb 9.6 oz (76 kg)  Height: 5\' 7"  (1.702 m)   Wt Readings from Last 3 Encounters:  09/28/17 167 lb 9.6 oz (76 kg)  09/21/17 159 lb 12.8 oz (72.5 kg)  09/18/17 165 lb (74.8 kg)    Labs: Lab Results  Component Value Date   NA 140 09/21/2017   K 4.1 09/21/2017   CL 103 09/21/2017   CO2 27 09/21/2017   GLUCOSE 184 (H) 09/21/2017   BUN 12 09/21/2017   CREATININE 0.80 09/21/2017   CALCIUM 9.9 09/21/2017   PHOS 3.2 06/02/2014   MG 2.1 09/21/2017   Lab Results  Component Value Date   INR 1.38 08/30/2013   Lab  Results  Component Value Date   CHOL 112 06/07/2017   HDL 29.60 (L) 06/07/2017   LDLCALC 62 06/07/2017   TRIG 99.0 06/07/2017     GEN- The patient is well appearing, alert and oriented x 3 today.   Head- normocephalic, atraumatic Eyes-  Sclera clear, conjunctiva pink Ears- hearing intact Oropharynx- clear Neck- supple, no JVP Lymph- no cervical lymphadenopathy Lungs- Clear to ausculation bilaterally, normal work of breathing Heart- irregular rate and rhythm, no murmurs, rubs or gallops, PMI not laterally displaced GI- soft, NT, ND, + BS Extremities- no clubbing, cyanosis, or edema MS- no significant deformity or atrophy Skin- no rash or lesion Psych- euthymic mood, full affect Neuro- strength and sensation are intact  EKG-afib with v rate of 78 bpm, qrs int 90 ms, qtc 449 ms Epic records reviewed Echo-Study Conclusions  - Left ventricle: The cavity size was normal. Wall thickness was   increased in a pattern of mild LVH. Systolic function was normal.   The estimated ejection fraction was in the range of 55% to 60%.   Wall motion was normal; there were no regional wall motion   abnormalities. Doppler parameters are consistent with abnormal   left ventricular relaxation (grade 1 diastolic dysfunction). - Aortic valve: Valve mobility was mildly restricted. There was   trivial regurgitation. - Aortic root: The aortic root was mildly dilated. - Right ventricle: The cavity size was mildly dilated. - Atrial septum: There was a patent foramen ovale.  Impressions:  - Normal LV systolic function; mild diastolic dysfunction;   sclerotic aortic valve with trace AI; mildly dilated aortic root;   mild RVE; PFO noted by color doppler.   Assessment and Plan: 1. Persistent afib  Successful cardioversion 08/2017 but ERAF Pt explained options, continue  with rate controlled afib, Tikosyn, or amiodarone, possibly ablation. Discussed with Dr. Rayann Heman, ablation,  would prefer for pt to  try AAD's first He is in clinic for Tikosyn admit, can afford generic dofetilide  Drugs screened by pharmD no qtc prolonging drugs Bmet/mag today  No missed doses of eliquis, no bendryl use Precautions with tikosyn discussed  Labs show today K+ at 4.8, mag at 2.3, crcl cal at 83.96 To be admitted later when beds are available     [name] [name], [facility] [address] [place], [place] [address] [phone]    Primary Care Physician: [name] Ghent, MD Referring Physician: Ignacia Bayley, PA Cardiologist: Dr. [name] is a 77 y.o. male with a h/o CAD status post CABG x6 in January 2015,( required amiodarone/warfarin for a short period of time), diabetes, hypertension, hyperlipidemia, PFO, embolic CVA. In the spring 2018, he was evaluated by neurology in the setting of falls.  MRI in September 2018 showed small subacute infarction of the bilateral centrum semi-ovale consistent with a prior embolic event.  It was felt that this was most likely explained by paroxysmal atrial fibrillation and he was placed on Eliquis 5 mg twice a day.He has persistent atrial fibrillation status post cardioversion earlier this month.  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 He has noted some fatigue and irregular heart beat with his activities in afib. His afib has slow v response and his metoprolol was reduced when seen by [name] [name]. He has checked with insurance and can afford dofetilide. On last visit, 09/07/17, when antiarrythmic options were discussed,he was  not excited having to come into hospital for 4 days. Sotalol would be affordable, but would be contraindicated with pt slow at baseline. No alcohol, tobacco, minimal caffeine. He prefers not to go back on amiodarone for possible long term side effects.  F/u in afib clinic 2/21, for f/u one week out of hospitalization for Tikosyn. He reports that his quality of life is so much  improved. His back and leg pain, insomnia,has resolved since being in SR. He is very pleased with the outcome of Tikosyn.He is in SR today with a stable qtc.  Today, he denies symptoms of chest pain, shortness of breath, orthopnea, PND, lower extremity edema, dizziness, presyncope, syncope, or neurologic sequela.  The patient is tolerating medications without difficulties and is otherwise without complaint today.   Past Medical History:  Diagnosis Date  . Anxiety   . Back pain   . Coronary artery disease 08/21/2013   a. 08/2013 s/p CABG x 6.  . Diabetes mellitus   . Diabetic neuropathy ([place])   . Diastolic dysfunction    a. 02/2017 Echo: EF 55-60%, no rwma, Gr1 DD, mildly dil Ao Root/RV. + PFO.  . Diverticulosis   . Diverticulosis   . Embolic stroke ([place])    a. 04/2017 MRI in setting of freq falls: small subacute infarction of bilat centrum semi-ovale consistent w/ embolic event-->Eliquis.  . Erectile dysfunction   . Family history of anesthesia complication    ' they cant wake my brother very easy"  . Fatty liver   . Flank pain   . GERD (gastroesophageal reflux disease)   . History of bronchitis   . History of chicken pox   . 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 Hypercholesterolemia   . Hypertension   . Kidney stones   . Osteoarthritis   . Persistent atrial fibrillation ([place])    a. Had post-op AF 08/2013; b. 09/2015 Holter: PAC's no AF; c. 05/2017 Noted to be in AFib-->Eliquis (CHA2DS2VASc = 7); d. 08/2017 s/p  DCCV.  . PFO (patent foramen ovale)    a. 02/2017 Echo: + PFO.   Past Surgical History:  Procedure Laterality Date  . CARDIAC CATHETERIZATION  08/21/2013   DR [name] [name]  . CARDIOVERSION N/A 08/24/2017   Procedure: CARDIOVERSION;  Surgeon: [name] Merritts, MD;  Location: [facility] ORS;  Service: Cardiovascular;  Laterality: N/A;  . CHOLECYSTECTOMY  1983  . CORONARY ARTERY BYPASS GRAFT N/A 08/23/2013   Procedure: CORONARY ARTERY BYPASS GRAFTING (CABG);  Surgeon:  [name] Isaac, MD;  Location: [place];  Service: Open Heart Surgery;  Laterality: N/A;  CABG times six utilizing the left internal mammary artery and the right greater saphenous vein harvested endoscopically  . ENDOVEIN HARVEST OF GREATER SAPHENOUS VEIN Right 08/23/2013   Procedure: ENDOVEIN HARVEST OF GREATER SAPHENOUS VEIN;  Surgeon: [name] Isaac, MD;  Location: [place];  Service: Open Heart Surgery;  Laterality: Right;  . HERNIA REPAIR     X3  . INTRAOPERATIVE TRANSESOPHAGEAL ECHOCARDIOGRAM N/A 08/23/2013   Procedure: INTRAOPERATIVE TRANSESOPHAGEAL ECHOCARDIOGRAM;  Surgeon: [name] Isaac, MD;  Location: [place];  Service: Open Heart Surgery;  Laterality: N/A;  . LEFT HEART CATHETERIZATION WITH CORONARY ANGIOGRAM N/A 08/21/2013   Procedure: LEFT HEART CATHETERIZATION WITH CORONARY ANGIOGRAM;  Surgeon: Burnell Blanks, MD;  Location[facility] C[facility] LAB;  Service: Cardiovascular;  Laterality: N/A;    Current Outpatient Medications  Medication Sig Dispense Refill  . acyclovir (ZOVIRAX) 800 MG tablet Take 1 tablet (800 mg total) by mouth 2 (two) times daily. 180 tablet 3  . amLODipine (NORVASC) 5 MG tablet Take 1 tablet (5 mg total) by mouth daily. 180 tablet 3  . apixaban (ELIQUIS) 5 MG TABS tablet Take 1 tablet (5 mg total) by mouth 2 (two) times daily. 180 tablet 4  . COD LIVER OIL PO Take 1 capsule by mouth 2 (two) times daily.    . diclofenac sodium (VOLTAREN) 1 % GEL Apply 4 g topically 4 (four) times daily as needed. (Patient taking differently: Apply 4 g topically 4 (four) times daily as needed (for pain). ) 300 g prn  . dofetilide (TIKOSYN) 500 MCG capsule Take 1 capsule (500 mcg total) by mouth 2 (two) times daily. 180 capsule 3  . gabapentin (NEURONTIN) 600 MG tablet Take 1 tablet (600 mg total) by mouth 3 (three) times daily. 270 tablet 3  . latanoprost (XALATAN) 0.005 % ophthalmic solution Place 1 drop into the right eye at bedtime. 7.5 mL 0  . losartan (COZAAR) 100 MG tablet Take  1 tablet (100 mg total) by mouth daily. 30 tablet 6  . metFORMIN (GLUCOPHAGE) 850 MG tablet Take 1 tablet (850 mg total) by mouth 2 (two) times daily with a meal. 180 tablet 1  . metoprolol tartrate (LOPRESSOR) 25 MG tablet Take 0.5 tablets (12.5 mg total) by mouth 2 (two) times daily. 90 tablet 3  . nitroGLYCERIN (NITROSTAT) 0.4 MG SL tablet Place 1 tablet (0.4 mg total) under the tongue every 5 (five) minutes as needed for chest pain (max 3 doses). 25 tablet 6   No current facility-administered medications for this encounter.     Allergies  Allergen Reactions  . Fenofibrate Other (See Comments)    Muscle weakness.    . Statins Other (See Comments)    MYALGIA, muscle pain    Social History   Socioeconomic History  . Marital status: Married    Spouse name: Not on file  . Number of children: 2  . Years of education: HS  .  Highest education level: Not on file  Social Needs  . Financial resource strain: Not on file  . Food insecurity - worry: Not on file  . Food insecurity - inability: Not on file  . Transportation needs - medical: Not on file  . Transportation needs - non-medical: Not on file  Occupational History  . Occupation: Retired  Tobacco Use  . Smoking status: Never Smoker  . Smokeless tobacco: Never Used  Substance and Sexual Activity  . Alcohol use: No    Alcohol/week: 0.0 oz  . Drug use: No  . Sexual activity: Not on file  Other Topics Concern  . 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   Right-handed.   2 cups caffeine daily.    Family History  Problem Relation Age of Onset  . Stroke Mother   . Diabetes Mother   . Heart disease Father        heart failure  . Colon cancer Maternal Grandmother   . Parkinsonism Maternal Grandfather   . Stroke Paternal Grandfather   . Prostate cancer Maternal Uncle   . CAD Brother         CABG    ROS- All systems are reviewed and negative except as per the HPI above  Physical Exam: There were no vitals filed for this visit. Wt Readings from Last 3 Encounters:  09/21/17 159 lb 12.8 oz (72.5 kg)  09/18/17 165 lb (74.8 kg)  09/15/17 163 lb 12 oz (74.3 kg)    Labs: Lab Results  Component Value Date   NA 140 09/21/2017   K 4.1 09/21/2017   CL 103 09/21/2017   CO2 27 09/21/2017   GLUCOSE 184 (H) 09/21/2017   BUN 12 09/21/2017   CREATININE 0.80 09/21/2017   CALCIUM 9.9 09/21/2017   PHOS 3.2 06/02/2014   MG 2.1 09/21/2017   Lab Results  Component Value Date   INR 1.38 08/30/2013   Lab Results  Component Value Date   CHOL 112 06/07/2017   HDL 29.60 (L) 06/07/2017   LDLCALC 62 06/07/2017   TRIG 99.0 06/07/2017     GEN- The patient is well appearing, alert and oriented x 3 today.   Head- normocephalic, atraumatic Eyes-  Sclera clear, conjunctiva pink Ears- hearing intact Oropharynx- clear Neck- supple, no JVP Lymph- no cervical lymphadenopathy Lungs- Clear to ausculation bilaterally, normal work of breathing Heart- irregular rate and rhythm, no murmurs, rubs or gallops, PMI not laterally displaced GI- soft, NT, ND, + BS Extremities- no clubbing, cyanosis, or edema MS- no significant deformity or atrophy Skin- no rash or lesion Psych- euthymic mood, full affect Neuro- strength and sensation are intact  EKG- Sinus brady at 58 bpm, Pr int 262 ms, qrs int 94 ms, qtc 449 ms Epic records reviewed Echo-Study Conclusions  - Left ventricle: The cavity size was normal. Wall thickness was   increased in a pattern of mild LVH. Systolic function was normal.   The estimated ejection fraction was in the range of 55% to 60%.   Wall motion was normal; there were no regional wall motion   abnormalities. Doppler parameters are consistent with abnormal   left ventricular relaxation (grade 1 diastolic dysfunction). - Aortic valve: Valve mobility was mildly  restricted. There was   trivial regurgitation. - Aortic root: The aortic root was mildly dilated. - Right ventricle:  The cavity size was mildly dilated. - Atrial septum: There was a patent foramen ovale.  Impressions:  - Normal LV systolic function; mild diastolic dysfunction;   sclerotic aortic valve with trace AI; mildly dilated aortic root;   mild RVE; PFO noted by color doppler.   Assessment and Plan: 1. Persistent afib  Successful cardioversion 08/2017 but ERAF Pt explained options, continue  with rate controlled afib, Tikosyn, or amiodarone, possibly ablation. Discussed with Dr. Rayann Heman  possibility of ablation, he would preferred for pt to try AAD's first He was admitted for [place] converted with drug and qtc remained stable He feels much improved with tikosyn Bmet/mag today  Precautions with tikosyn discussed  F/u with Dr. [name] as scheduled in one month and Dr. Rockey Situ in March as well    [name] [name], [facility] [address] [place], [place] [address] [phone]

## 2017-10-03 DIAGNOSIS — Z961 Presence of intraocular lens: Secondary | ICD-10-CM | POA: Diagnosis not present

## 2017-10-03 DIAGNOSIS — E119 Type 2 diabetes mellitus without complications: Secondary | ICD-10-CM | POA: Diagnosis not present

## 2017-10-03 DIAGNOSIS — Z8669 Personal history of other diseases of the nervous system and sense organs: Secondary | ICD-10-CM | POA: Diagnosis not present

## 2017-10-03 DIAGNOSIS — H02833 Dermatochalasis of right eye, unspecified eyelid: Secondary | ICD-10-CM | POA: Diagnosis not present

## 2017-10-03 DIAGNOSIS — H02401 Unspecified ptosis of right eyelid: Secondary | ICD-10-CM | POA: Diagnosis not present

## 2017-10-03 DIAGNOSIS — H25812 Combined forms of age-related cataract, left eye: Secondary | ICD-10-CM | POA: Diagnosis not present

## 2017-10-03 DIAGNOSIS — H02836 Dermatochalasis of left eye, unspecified eyelid: Secondary | ICD-10-CM | POA: Diagnosis not present

## 2017-10-03 DIAGNOSIS — H1789 Other corneal scars and opacities: Secondary | ICD-10-CM | POA: Diagnosis not present

## 2017-10-03 DIAGNOSIS — H40021 Open angle with borderline findings, high risk, right eye: Secondary | ICD-10-CM | POA: Diagnosis not present

## 2017-10-24 ENCOUNTER — Ambulatory Visit: Payer: Medicare Other | Admitting: Internal Medicine

## 2017-10-24 ENCOUNTER — Encounter: Payer: Self-pay | Admitting: Internal Medicine

## 2017-10-24 VITALS — BP 120/60 | HR 50 | Ht 67.0 in | Wt 165.0 lb

## 2017-10-24 DIAGNOSIS — Z951 Presence of aortocoronary bypass graft: Secondary | ICD-10-CM

## 2017-10-24 DIAGNOSIS — I4819 Other persistent atrial fibrillation: Secondary | ICD-10-CM

## 2017-10-24 DIAGNOSIS — I481 Persistent atrial fibrillation: Secondary | ICD-10-CM | POA: Diagnosis not present

## 2017-10-24 NOTE — Patient Instructions (Addendum)

## 2017-10-24 NOTE — Progress Notes (Signed)
HPI [name] returns today for ongoing evaluation and management of atrial fib. He was admitted several weeks ago and was placed on dofetilide and reverted back to NSR. He has done well in the interim. No chest pain or sob and his energy level is improved. No prolongation of the QT interval.  Allergies  Allergen Reactions  . Fenofibrate Other (See Comments)    Muscle weakness.    . Statins Other (See Comments)    MYALGIA, muscle pain     Current Outpatient Medications  Medication Sig Dispense Refill  . acyclovir (ZOVIRAX) 800 MG tablet Take 1 tablet (800 mg total) by mouth 2 (two) times daily. 180 tablet 3  . amLODipine (NORVASC) 5 MG tablet Take 1 tablet (5 mg total) by mouth daily. 180 tablet 3  . apixaban (ELIQUIS) 5 MG TABS tablet Take 1 tablet (5 mg total) by mouth 2 (two) times daily. 180 tablet 4  . COD LIVER OIL PO Take 1 capsule by mouth 2 (two) times daily.    . diclofenac sodium (VOLTAREN) 1 % GEL Apply 4 g topically 4 (four) times daily as needed. (Patient taking differently: Apply 4 g topically 4 (four) times daily as needed (for pain). ) 300 g prn  . dofetilide (TIKOSYN) 500 MCG capsule Take 1 capsule (500 mcg total) by mouth 2 (two) times daily. 180 capsule 3  . gabapentin (NEURONTIN) 600 MG tablet Take 1 tablet (600 mg total) by mouth 3 (three) times daily. 270 tablet 3  . latanoprost (XALATAN) 0.005 % ophthalmic solution Place 1 drop into the right eye at bedtime. 7.5 mL 0  . losartan (COZAAR) 100 MG tablet Take 1 tablet (100 mg total) by mouth daily. 30 tablet 6  . metFORMIN (GLUCOPHAGE) 850 MG tablet Take 1 tablet (850 mg total) by mouth 2 (two) times daily with a meal. 180 tablet 1  . metoprolol tartrate (LOPRESSOR) 25 MG tablet Take 0.5 tablets (12.5 mg total) by mouth 2 (two) times daily. 90 tablet 3  . nitroGLYCERIN (NITROSTAT) 0.4 MG SL tablet Place 1 tablet (0.4 mg total) under the tongue every 5 (five) minutes as needed for chest pain (max 3 doses). 25  tablet 6   No current facility-administered medications for this visit.      Past Medical History:  Diagnosis Date  . Anxiety   . Back pain   . Coronary artery disease 08/21/2013   a. 08/2013 s/p CABG x 6.  . Diabetes mellitus   . Diabetic neuropathy ([place])   . Diastolic dysfunction    a. 02/2017 Echo: EF 55-60%, no rwma, Gr1 DD, mildly dil Ao Root/RV. + PFO.  . Diverticulosis   . Diverticulosis   . Embolic stroke ([place])    a. 04/2017 MRI in setting of freq falls: small subacute infarction of bilat centrum semi-ovale consistent w/ embolic event-->Eliquis.  . Erectile dysfunction   . Family history of anesthesia complication    ' they cant wake my brother very easy"  . Fatty liver   . Flank pain   . GERD (gastroesophageal reflux disease)   . History of bronchitis   . History of chicken pox   . 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 Hypercholesterolemia   . Hypertension   . Kidney stones   . Osteoarthritis   . Persistent atrial fibrillation ([place])    a. Had post-op AF 08/2013; b. 09/2015 Holter: PAC's no AF; c. 05/2017 Noted to be in  AFib-->Eliquis (CHA2DS2VASc = 7); d. 08/2017 s/p DCCV.  Marland Kitchen PFO (patent foramen ovale)    a. 02/2017 Echo: + PFO.    ROS:   All systems reviewed and negative except as noted in the HPI.   Past Surgical History:  Procedure Laterality Date  . CARDIAC CATHETERIZATION  08/21/2013   DR Angelena Form  . CARDIOVERSION N/A 08/24/2017   Procedure: CARDIOVERSION;  Surgeon: Minna Merritts, MD;  Location: [facility];  Service: Cardiovascular;  Laterality: N/A;  . CHOLECYSTECTOMY  1983  . CORONARY ARTERY BYPASS GRAFT N/A 08/23/2013   Procedure: CORONARY ARTERY BYPASS GRAFTING (CABG);  Surgeon: Grace Isaac, MD;  Location: [place];  Service: Open Heart Surgery;  Laterality: N/A;  CABG times six utilizing the left internal mammary artery and the right greater saphenous vein harvested endoscopically  . ENDOVEIN HARVEST OF GREATER SAPHENOUS VEIN Right  08/23/2013   Procedure: ENDOVEIN HARVEST OF GREATER SAPHENOUS VEIN;  Surgeon: Grace Isaac, MD;  Location: [place];  Service: Open Heart Surgery;  Laterality: Right;  . HERNIA REPAIR     X3  . INTRAOPERATIVE TRANSESOPHAGEAL ECHOCARDIOGRAM N/A 08/23/2013   Procedure: INTRAOPERATIVE TRANSESOPHAGEAL ECHOCARDIOGRAM;  Surgeon: Grace Isaac, MD;  Location: [place];  Service: Open Heart Surgery;  Laterality: N/A;  . LEFT HEART CATHETERIZATION WITH CORONARY ANGIOGRAM N/A 08/21/2013   Procedure: LEFT HEART CATHETERIZATION WITH CORONARY ANGIOGRAM;  Surgeon: Burnell Blanks, MD;  Location: [facility] CATH LAB;  Service: Cardiovascular;  Laterality: N/A;     Family History  Problem Relation Age of Onset  . Stroke Mother   . Diabetes Mother   . Heart disease Father        heart failure  . Colon cancer Maternal Grandmother   . Parkinsonism Maternal Grandfather   . Stroke Paternal Grandfather   . Prostate cancer Maternal Uncle   . CAD Brother        CABG     Social History   Socioeconomic History  . Marital status: Married    Spouse name: Not on file  . Number of children: 2  . Years of education: HS  . Highest education level: Not on file  Social Needs  . Financial resource strain: Not on file  . Food insecurity - worry: Not on file  . Food insecurity - inability: Not on file  . Transportation needs - medical: Not on file  . Transportation needs - non-medical: Not on file  Occupational History  . Occupation: Retired  Tobacco Use  . Smoking status: Never Smoker  . Smokeless tobacco: Never Used  Substance and Sexual Activity  . Alcohol use: No    Alcohol/week: 0.0 oz  . Drug use: No  . Sexual activity: Not on file  Other Topics Concern  . 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   Right-handed.   2 cups caffeine daily.     BP 120/60    Pulse (!) 50   Ht 5\' 7"  (1.702 m)   Wt 165 lb (74.8 kg)   BMI 25.84 kg/m   Physical Exam:  Well appearing NAD HEENT: Unremarkable Neck:  No JVD, no thyromegally Lymphatics:  No adenopathy Back:  No CVA tenderness Lungs:  Clear wit no wheezes HEART:  Regular rate rhythm, no murmurs, no rubs, no clicks Abd:  soft, positive bowel sounds, no organomegally, no rebound,  no guarding Ext:  2 plus pulses, no edema, no cyanosis, no clubbing Skin:  No rashes no nodules Neuro:  CN II through XII intact, motor grossly intact  EKG - NSR   Assess/Plan: 1. Persistent atrial fib - he is maintaining NSR on dofetilide. He will continue his current meds. 2. CAD - he is s/p CABG remotely. He will continue his current meds. No angina. 3. HTN - his blood pressure is well controlled. No change in meds.  Mikle Bosworth.D.

## 2017-10-25 ENCOUNTER — Telehealth: Payer: Self-pay | Admitting: Internal Medicine

## 2017-10-25 NOTE — Telephone Encounter (Signed)
Pt c/o medication issue:  1. Name of Medication: Tikosyn  2. How are you currently taking this medication (dosage and times per day)?  500mg  //2x daily   3. Are you having a reaction (difficulty breathing--STAT)? no  4. What is your medication issue? Patient states that he was advised not to use the Benadryl medication with this Tikosyn medication. 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

## 2017-10-26 NOTE — Telephone Encounter (Signed)
Returned call to Pt.  Per Marzetta Board with Afib clinic Pt may take claritin and OTC hydrocortisone cream for reactions to contact with bees.  Pt indicates understanding.

## 2017-10-27 NOTE — Progress Notes (Signed)
Cardiology Office Note  Date:  10/31/2017   ID:  [name] [name], [name] [name] [birth year], MRN [id number]  PCP:  Tonia Ghent, MD   Chief Complaint  Patient presents with  . other    2 month follow up. Meds reviewed by the pt. verbally. "doing well."     HPI:  [name] [name] is a 77 year old gentleman with past medical history of Hypertension Diabetes Coronary artery disease, bypass surgery 6  2015 Ejection fraction 55% by echo July 2018 mildly dilated aortic root, PFO Previous strokes,  atrial fib, on eliquis, CHA2DS2VASc = 7);  Cardioversion 08/24/2017 admitted February 2019 s placed on dofetilide and reverted back to NSR.   No prolongation of the QT interval.  Who presents for follow-up of his coronary artery disease and atrial fibrillation  Asymptomatic bradycardia Rate 50 bpm Recently in the hospital as detailed above Reports that he feels well, feels better, denies any tachycardia or palpitations EKG last week showing normal sinus rhythm at home   reports blood pressure well controlled at home  Long discussion concerning his history of coronary disease and bypass 2015 Long history of Statins myalgia: Previously managed by Dr. Redmond Pulling  Lab work reviewed with him hba1C 6.6  [employer] 6 days a week Active at baseline, wife reports he has some memory issues   PMH:   has a past medical history of Anxiety, Back pain, Coronary artery disease (08/21/2013), Diabetes mellitus, Diabetic neuropathy ([place]), Diastolic dysfunction, Diverticulosis, Diverticulosis, Embolic stroke ([place]), Erectile dysfunction, Family history of anesthesia complication, Fatty liver, Flank pain, GERD (gastroesophageal reflux disease), History of bronchitis, History of chicken pox, HSV infection, Hypercholesterolemia, Hypertension, Kidney stones, Osteoarthritis, Persistent atrial fibrillation ([place]), and PFO (patent foramen ovale).  PSH:    Past Surgical History:  Procedure Laterality Date  . CARDIAC CATHETERIZATION   08/21/2013   DR Angelena Form  . CARDIOVERSION N/A 08/24/2017   Procedure: CARDIOVERSION;  Surgeon: Minna Merritts, MD;  Location: [facility];  Service: Cardiovascular;  Laterality: N/A;  . CHOLECYSTECTOMY  1983  . CORONARY ARTERY BYPASS GRAFT N/A 08/23/2013   Procedure: CORONARY ARTERY BYPASS GRAFTING (CABG);  Surgeon: Grace Isaac, MD;  Location: [place];  Service: Open Heart Surgery;  Laterality: N/A;  CABG times six utilizing the left internal mammary artery and the right greater saphenous vein harvested endoscopically  . ENDOVEIN HARVEST OF GREATER SAPHENOUS VEIN Right 08/23/2013   Procedure: ENDOVEIN HARVEST OF GREATER SAPHENOUS VEIN;  Surgeon: Grace Isaac, MD;  Location: [place];  Service: Open Heart Surgery;  Laterality: Right;  . HERNIA REPAIR     X3  . INTRAOPERATIVE TRANSESOPHAGEAL ECHOCARDIOGRAM N/A 08/23/2013   Procedure: INTRAOPERATIVE TRANSESOPHAGEAL ECHOCARDIOGRAM;  Surgeon: Grace Isaac, MD;  Location: [place];  Service: Open Heart Surgery;  Laterality: N/A;  . LEFT HEART CATHETERIZATION WITH CORONARY ANGIOGRAM N/A 08/21/2013   Procedure: LEFT HEART CATHETERIZATION WITH CORONARY ANGIOGRAM;  Surgeon: Burnell Blanks, MD;  [facility];  Service: Cardiovascular;  Laterality: N/A;    Current Outpatient Medications  Medication Sig Dispense Refill  . acyclovir (ZOVIRAX) 800 MG tablet Take 1 tablet (800 mg total) by mouth 2 (two) times daily. 180 tablet 3  . amLODipine (NORVASC) 5 MG tablet Take 1 tablet (5 mg total) by mouth daily. 180 tablet 3  . apixaban (ELIQUIS) 5 MG TABS tablet Take 1 tablet (5 mg total) by mouth 2 (two) times daily. 180 tablet 4  . COD LIVER OIL PO Take 1 capsule by mouth 2 (two) times  daily.    . diclofenac sodium (VOLTAREN) 1 % GEL Apply 4 g topically 4 (four) times daily as needed. (Patient taking differently: Apply 4 g topically 4 (four) times daily as needed (for pain). ) 300 g prn  . dofetilide (TIKOSYN) 500 MCG capsule Take 1 capsule  (500 mcg total) by mouth 2 (two) times daily. 180 capsule 3  . gabapentin (NEURONTIN) 600 MG tablet Take 1 tablet (600 mg total) by mouth 3 (three) times daily. 270 tablet 3  . latanoprost (XALATAN) 0.005 % ophthalmic solution Place 1 drop into the right eye at bedtime. 7.5 mL 0  . losartan (COZAAR) 100 MG tablet Take 1 tablet (100 mg total) by mouth daily. 30 tablet 6  . metFORMIN (GLUCOPHAGE) 850 MG tablet Take 1 tablet (850 mg total) by mouth 2 (two) times daily with a meal. 180 tablet 1  . metoprolol tartrate (LOPRESSOR) 25 MG tablet Take 0.5 tablets (12.5 mg total) by mouth 2 (two) times daily. 90 tablet 3  . nitroGLYCERIN (NITROSTAT) 0.4 MG SL tablet Place 1 tablet (0.4 mg total) under the tongue every 5 (five) minutes as needed for chest pain (max 3 doses). 25 tablet 6  . ezetimibe (ZETIA) 10 MG tablet Take 1 tablet (10 mg total) by mouth daily. 90 tablet 3   No current facility-administered medications for this visit.      Allergies:   Fenofibrate and Statins   Social History:  The patient  reports that he has never smoked. He has never used smokeless tobacco. He reports that he does not drink alcohol or use drugs.   Family History:   family history includes CAD in his brother; Colon cancer in his maternal grandmother; Diabetes in his mother; Heart disease in his father; Parkinsonism in his maternal grandfather; Prostate cancer in his maternal uncle; Stroke in his mother and paternal grandfather.    Review of Systems: Review of Systems  Constitutional: Negative.   Respiratory: Negative.   Cardiovascular: Negative.   Gastrointestinal: Negative.   Musculoskeletal: Negative.        Leg pain, foot pain  Neurological: Negative.   Psychiatric/Behavioral: Negative.   All other systems reviewed and are negative.    PHYSICAL EXAM: VS:  BP (!) 144/60 (BP Location: Left Arm, Patient Position: Sitting, Cuff Size: Normal)   Pulse (!) 50   Ht 5\' 7"  (1.702 m)   Wt 167 lb (75.8 kg)    BMI 26.16 kg/m  , BMI Body mass index is 26.16 kg/m. GEN: Well nourished, well developed, in no acute distress  HEENT: normal  Neck: no JVD,  or masses, 1+ bruit on the right Cardiac: RRR; no murmurs, rubs, or gallops,no edema  Unable to appreciate lower extremity pulses Respiratory:  clear to auscultation bilaterally, normal work of breathing GI: soft, nontender, nondistended, + BS MS: no deformity or atrophy  Skin: warm and dry, no rash Neuro:  Strength and sensation are intact Psych: euthymic mood, full affect    Recent Labs: 08/16/2017: ALT 15; Hemoglobin 16.2; Platelets 169.0; TSH 2.48 09/28/2017: BUN 10; Creatinine, Ser 0.72; Magnesium 2.1; Potassium 4.1; Sodium 140    Lipid Panel Lab Results  Component Value Date   CHOL 112 06/07/2017   HDL 29.60 (L) 06/07/2017   LDLCALC 62 06/07/2017   TRIG 99.0 06/07/2017      Wt Readings from Last 3 Encounters:  10/31/17 167 lb (75.8 kg)  10/24/17 165 lb (74.8 kg)  09/28/17 167 lb 9.6 oz (76 kg)  ASSESSMENT AND PLAN:  Persistent atrial fibrillation (HCC) Managed by Dr. [name], Maintaining normal sinus rhythm  no medication changes made On Eliquis 5 twice daily  Atherosclerosis of native coronary artery of native heart with stable angina pectoris [facility]) Long discussion concerning history of coronary disease Unable to tolerate a statin, interestingly LDL relatively low He would not qualify for repatha or praluent, PCSK 9 inhibitor  S/P CABG x 6 Denies any anginal symptoms No further testing at this time  Hypercholesteremia Recommended he add Zetia 10 mg daily  Cerebrovascular accident (CVA) due to embolism of anterior cerebral artery, unspecified blood vessel laterality ([place]) Denies any further TIA or stroke symptoms Carotid ultrasound ordered  PFO (patent foramen ovale) He is on Eliquis 5 twice daily  Essential hypertension Blood pressure is well controlled on today's visit. No changes made to the  medications.  Stenosis of carotid artery, unspecified laterality - Plan: VAS US CAROTID Bruit on the right High risk of carotid stenosis given his coronary disease  Claudication ([place]) - Plan: VAS [place] LOWER EXTREMITY ARTERIAL DUPLEX We have ordered lower extremity arterial Doppler Having foot pain, leg pain co whom nsistent with claudication  Disposition:   F/U  12 months  No orders of the defined types were placed in this encounter.   Total encounter time more than 45 minutes  Greater than 50% was spent in counseling and coordination of care with the patient   Signed, Esmond Plants, M.D., Ph.D. 10/31/2017  [place], [place]

## 2017-10-31 ENCOUNTER — Ambulatory Visit: Payer: Medicare Other | Admitting: Cardiovascular Disease

## 2017-10-31 ENCOUNTER — Encounter: Payer: Self-pay | Admitting: Cardiovascular Disease

## 2017-10-31 VITALS — BP 144/60 | HR 50 | Ht 67.0 in | Wt 167.0 lb

## 2017-10-31 DIAGNOSIS — Z951 Presence of aortocoronary bypass graft: Secondary | ICD-10-CM

## 2017-10-31 DIAGNOSIS — I25118 Atherosclerotic heart disease of native coronary artery with other forms of angina pectoris: Secondary | ICD-10-CM

## 2017-10-31 DIAGNOSIS — I739 Peripheral vascular disease, unspecified: Secondary | ICD-10-CM

## 2017-10-31 DIAGNOSIS — I10 Essential (primary) hypertension: Secondary | ICD-10-CM | POA: Diagnosis not present

## 2017-10-31 DIAGNOSIS — I63429 Cerebral infarction due to embolism of unspecified anterior cerebral artery: Secondary | ICD-10-CM

## 2017-10-31 DIAGNOSIS — Q211 Atrial septal defect: Secondary | ICD-10-CM | POA: Diagnosis not present

## 2017-10-31 DIAGNOSIS — I4819 Other persistent atrial fibrillation: Secondary | ICD-10-CM

## 2017-10-31 DIAGNOSIS — E785 Hyperlipidemia, unspecified: Secondary | ICD-10-CM

## 2017-10-31 DIAGNOSIS — I6529 Occlusion and stenosis of unspecified carotid artery: Secondary | ICD-10-CM

## 2017-10-31 DIAGNOSIS — E7800 Pure hypercholesterolemia, unspecified: Secondary | ICD-10-CM | POA: Diagnosis not present

## 2017-10-31 DIAGNOSIS — I481 Persistent atrial fibrillation: Secondary | ICD-10-CM

## 2017-10-31 DIAGNOSIS — Q2112 Patent foramen ovale: Secondary | ICD-10-CM

## 2017-10-31 MED ORDER — EZETIMIBE 10 MG PO TABS: 10.0000 mg | ORAL_TABLET | Freq: Every day | ORAL | 3 refills | Status: DC

## 2017-10-31 NOTE — Patient Instructions (Addendum)
Medication Instructions:  Your physician has recommended you make the following change in your medication:  1. START Zetia 10 mg once daily for cholesterol  Labwork:  No new labs needed  Testing/Procedures: Your physician has requested that you have a carotid duplex. This test is an ultrasound of the carotid arteries in your neck. It looks at blood flow through these arteries that supply the brain with blood. Allow one hour for this exam. There are no restrictions or special instructions.  Your physician has requested that you have a lower or upper extremity arterial duplex. This test is an ultrasound of the arteries in the legs or arms. It looks at arterial blood flow in the legs and arms. Allow one hour for Lower and Upper Arterial scans. There are no restrictions or special instructions  We will order a carotid u/s for bruit on the right , carotid stenosis We will order a lower extremity arterial for claudication, no pulses   Follow-Up: It was a pleasure seeing you in the office today. Please call us if you have new issues that need to be addressed before your next appt.  [phone]  Your physician wants you to follow-up in: 12 months.  You will receive a reminder letter in the mail two months in advance. If you don't receive a letter, please call our office to schedule the follow-up appointment.  If you need a refill on your cardiac medications before your next appointment, please call your pharmacy.  For educational health videos Log in to : [email or web address] Or : [email or web address], password : [email or web address]

## 2017-11-08 ENCOUNTER — Other Ambulatory Visit: Payer: Medicare Other

## 2017-11-10 ENCOUNTER — Ambulatory Visit: Payer: Medicare Other | Admitting: Family Medicine

## 2017-11-15 ENCOUNTER — Other Ambulatory Visit: Payer: Self-pay | Admitting: Cardiovascular Disease

## 2017-11-15 DIAGNOSIS — I739 Peripheral vascular disease, unspecified: Secondary | ICD-10-CM

## 2017-11-22 ENCOUNTER — Ambulatory Visit (INDEPENDENT_AMBULATORY_CARE_PROVIDER_SITE_OTHER): Payer: Medicare Other

## 2017-11-22 DIAGNOSIS — I739 Peripheral vascular disease, unspecified: Secondary | ICD-10-CM

## 2017-11-22 DIAGNOSIS — I6529 Occlusion and stenosis of unspecified carotid artery: Secondary | ICD-10-CM | POA: Diagnosis not present

## 2017-11-24 ENCOUNTER — Other Ambulatory Visit: Payer: Self-pay

## 2017-11-24 MED ORDER — EZETIMIBE 10 MG PO TABS: 10.0000 mg | ORAL_TABLET | Freq: Every day | ORAL | 0 refills | Status: DC

## 2017-12-08 ENCOUNTER — Other Ambulatory Visit: Payer: Self-pay | Admitting: *Deleted

## 2017-12-08 NOTE — Telephone Encounter (Signed)
Faxed refill request. Acyclovir Last office visit:   09/15/17 Last Filled:    180 tablet 3 08/02/2016  Please advise.

## 2017-12-10 MED ORDER — ACYCLOVIR 800 MG PO TABS: 800.0000 mg | ORAL_TABLET | Freq: Two times a day (BID) | ORAL | 3 refills | Status: DC

## 2017-12-10 NOTE — Telephone Encounter (Signed)
Sent. Thanks.   

## 2018-01-12 DIAGNOSIS — L820 Inflamed seborrheic keratosis: Secondary | ICD-10-CM | POA: Diagnosis not present

## 2018-01-12 DIAGNOSIS — L570 Actinic keratosis: Secondary | ICD-10-CM | POA: Diagnosis not present

## 2018-02-22 ENCOUNTER — Other Ambulatory Visit: Payer: Self-pay | Admitting: Family Medicine

## 2018-02-23 DIAGNOSIS — E1140 Type 2 diabetes mellitus with diabetic neuropathy, unspecified: Secondary | ICD-10-CM | POA: Diagnosis not present

## 2018-02-23 DIAGNOSIS — M79672 Pain in left foot: Secondary | ICD-10-CM | POA: Diagnosis not present

## 2018-02-23 DIAGNOSIS — M79671 Pain in right foot: Secondary | ICD-10-CM | POA: Diagnosis not present

## 2018-03-28 ENCOUNTER — Telehealth: Payer: Self-pay | Admitting: Family Medicine

## 2018-03-28 MED ORDER — LOSARTAN POTASSIUM 100 MG PO TABS: 100.0000 mg | ORAL_TABLET | Freq: Every day | ORAL | 6 refills | Status: DC

## 2018-03-28 NOTE — Telephone Encounter (Signed)
Refill sent. Thanks

## 2018-03-28 NOTE — Telephone Encounter (Signed)
Pt walked in office and need a refill for Losartan 100mg . Take 1 tablet by mouth daily. This medication wasn't prescribed by Dr Damita Dunnings.    Medication was prescribed by  [facility].

## 2018-04-04 ENCOUNTER — Other Ambulatory Visit: Payer: Self-pay

## 2018-04-04 MED ORDER — METOPROLOL TARTRATE 25 MG PO TABS: 12.5000 mg | ORAL_TABLET | Freq: Two times a day (BID) | ORAL | 3 refills | Status: DC

## 2018-05-07 ENCOUNTER — Ambulatory Visit: Payer: Medicare Other | Admitting: Internal Medicine

## 2018-05-07 ENCOUNTER — Encounter: Payer: Self-pay | Admitting: Internal Medicine

## 2018-05-07 VITALS — BP 148/68 | HR 58 | Ht 67.0 in | Wt 171.0 lb

## 2018-05-07 DIAGNOSIS — I4819 Other persistent atrial fibrillation: Secondary | ICD-10-CM

## 2018-05-07 DIAGNOSIS — I481 Persistent atrial fibrillation: Secondary | ICD-10-CM

## 2018-05-07 MED ORDER — APIXABAN 5 MG PO TABS: 5.0000 mg | ORAL_TABLET | Freq: Two times a day (BID) | ORAL | 3 refills | Status: DC

## 2018-05-07 MED ORDER — AMLODIPINE BESYLATE 5 MG PO TABS: 5.0000 mg | ORAL_TABLET | Freq: Every day | ORAL | 3 refills | Status: DC

## 2018-05-07 MED ORDER — LOSARTAN POTASSIUM 100 MG PO TABS: 100.0000 mg | ORAL_TABLET | Freq: Every day | ORAL | 3 refills | Status: DC

## 2018-05-07 MED ORDER — DOFETILIDE 500 MCG PO CAPS: 500.0000 ug | ORAL_CAPSULE | Freq: Two times a day (BID) | ORAL | 3 refills | Status: DC

## 2018-05-07 NOTE — Progress Notes (Signed)
HPI [name] [name] returns today for ongoing evaluation and management of atrial fib. He was admitted several months ago and was placed on dofetilide and reverted back to NSR. He has done well in the interim. No chest pain or sob and his energy level is improved. No prolongation of the QT interval. He remains active. Allergies  Allergen Reactions  . Fenofibrate Other (See Comments)    Muscle weakness.    . Other   . Statins Other (See Comments)    MYALGIA, muscle pain     Current Outpatient Medications  Medication Sig Dispense Refill  . acyclovir (ZOVIRAX) 800 MG tablet Take 1 tablet (800 mg total) by mouth 2 (two) times daily. 180 tablet 3  . amLODipine (NORVASC) 5 MG tablet Take 1 tablet (5 mg total) by mouth daily. 180 tablet 3  . apixaban (ELIQUIS) 5 MG TABS tablet Take 1 tablet (5 mg total) by mouth 2 (two) times daily. 180 tablet 4  . COD LIVER OIL PO Take 1 capsule by mouth 2 (two) times daily.    . diclofenac sodium (VOLTAREN) 1 % GEL Apply 4 g topically 4 (four) times daily as needed. (Patient taking differently: Apply 4 g topically 4 (four) times daily as needed (for pain). ) 300 g prn  . dofetilide (TIKOSYN) 500 MCG capsule Take 1 capsule (500 mcg total) by mouth 2 (two) times daily. 180 capsule 3  . gabapentin (NEURONTIN) 600 MG tablet Take 1 tablet (600 mg total) by mouth 3 (three) times daily. 270 tablet 3  . latanoprost (XALATAN) 0.005 % ophthalmic solution Place 1 drop into the right eye at bedtime. 7.5 mL 0  . losartan (COZAAR) 100 MG tablet Take 1 tablet (100 mg total) by mouth daily. 30 tablet 6  . metFORMIN (GLUCOPHAGE) 850 MG tablet TAKE 1 TABLET BY MOUTH TWICE DAILY WITH MEALS 180 tablet 0  . metoprolol tartrate (LOPRESSOR) 25 MG tablet Take 0.5 tablets (12.5 mg total) by mouth 2 (two) times daily. 90 tablet 3  . nitroGLYCERIN (NITROSTAT) 0.4 MG SL tablet Place 1 tablet (0.4 mg total) under the tongue every 5 (five) minutes as needed for chest pain (max 3 doses). 25  tablet 6  . ezetimibe (ZETIA) 10 MG tablet Take 1 tablet (10 mg total) by mouth daily. 90 tablet 0   No current facility-administered medications for this visit.      Past Medical History:  Diagnosis Date  . Anxiety   . Back pain   . Coronary artery disease 08/21/2013   a. 08/2013 s/p CABG x 6.  . Diabetes mellitus   . Diabetic neuropathy ([place])   . Diastolic dysfunction    a. 02/2017 Echo: EF 55-60%, no rwma, Gr1 DD, mildly dil Ao Root/RV. + PFO.  . Diverticulosis   . Diverticulosis   . Embolic stroke ([place])    a. 04/2017 MRI in setting of freq falls: small subacute infarction of bilat centrum semi-ovale consistent w/ embolic event-->Eliquis.  . Erectile dysfunction   . Family history of anesthesia complication    ' they cant wake my brother very easy"  . Fatty liver   . Flank pain   . GERD (gastroesophageal reflux disease)   . History of bronchitis   . History of chicken pox   . 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 Hypercholesterolemia   . Hypertension   . Kidney stones   . Osteoarthritis   . Persistent atrial fibrillation ([place])  a. Had post-op AF 08/2013; b. 09/2015 Holter: PAC's no AF; c. 05/2017 Noted to be in AFib-->Eliquis (CHA2DS2VASc = 7); d. 08/2017 s/p DCCV.  Marland Kitchen PFO (patent foramen ovale)    a. 02/2017 Echo: + PFO.    ROS:   All systems reviewed and negative except as noted in the HPI.   Past Surgical History:  Procedure Laterality Date  . CARDIAC CATHETERIZATION  08/21/2013   DR Angelena Form  . CARDIOVERSION N/A 08/24/2017   Procedure: CARDIOVERSION;  Surgeon: Minna Merritts, MD;  Location: [facility];  Service: Cardiovascular;  Laterality: N/A;  . CHOLECYSTECTOMY  1983  . CORONARY ARTERY BYPASS GRAFT N/A 08/23/2013   Procedure: CORONARY ARTERY BYPASS GRAFTING (CABG);  Surgeon: Grace Isaac, MD;  Location: [place];  Service: Open Heart Surgery;  Laterality: N/A;  CABG times six utilizing the left internal mammary artery and the right greater  saphenous vein harvested endoscopically  . ENDOVEIN HARVEST OF GREATER SAPHENOUS VEIN Right 08/23/2013   Procedure: ENDOVEIN HARVEST OF GREATER SAPHENOUS VEIN;  Surgeon: Grace Isaac, MD;  Location: [place];  Service: Open Heart Surgery;  Laterality: Right;  . HERNIA REPAIR     X3  . INTRAOPERATIVE TRANSESOPHAGEAL ECHOCARDIOGRAM N/A 08/23/2013   Procedure: INTRAOPERATIVE TRANSESOPHAGEAL ECHOCARDIOGRAM;  Surgeon: Grace Isaac, MD;  Location: [place];  Service: Open Heart Surgery;  Laterality: N/A;  . LEFT HEART CATHETERIZATION WITH CORONARY ANGIOGRAM N/A 08/21/2013   Procedure: LEFT HEART CATHETERIZATION WITH CORONARY ANGIOGRAM;  Surgeon: Burnell Blanks, MD;  Location: [facility];  Service: Cardiovascular;  Laterality: N/A;     Family History  Problem Relation Age of Onset  . Stroke Mother   . Diabetes Mother   . Heart disease Father        heart failure  . Colon cancer Maternal Grandmother   . Parkinsonism Maternal Grandfather   . Stroke Paternal Grandfather   . Prostate cancer Maternal Uncle   . CAD Brother        CABG     Social History   Socioeconomic History  . Marital status: Married    Spouse name: Not on file  . Number of children: 2  . Years of education: HS  . Highest education level: Not on file  Occupational History  . Occupation: Retired  Scientific laboratory technician  . Financial resource strain: Not on file  . Food insecurity:    Worry: Not on file    Inability: Not on file  . Transportation needs:    Medical: Not on file    Non-medical: Not on file  Tobacco Use  . Smoking status: Never Smoker  . Smokeless tobacco: Never Used  Substance and Sexual Activity  . Alcohol use: No    Alcohol/week: 0.0 standard drinks  . Drug use: No  . Sexual activity: Not on file  Lifestyle  . Physical activity:    Days per week: Not on file    Minutes per session: Not on file  . Stress: Not on file  Relationships  . Social connections:    Talks on phone: Not on file     Gets together: Not on file    Attends religious service: Not on file    Active member of club or organization: Not on file    Attends meetings of clubs or organizations: Not on file    Relationship status: Not on file  . Intimate partner violence:    Fear of current or ex partner: Not on file  Emotionally abused: Not on file    Physically abused: Not on file    Forced sexual activity: Not on file  Other Topics Concern  . 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   Right-handed.   2 cups caffeine daily.     BP (!) 148/68   Pulse (!) 58   Ht 5\' 7"  (1.702 m)   Wt 171 lb (77.6 kg)   BMI 26.78 kg/m   Physical Exam:  Well appearing NAD HEENT: Unremarkable Neck:  No JVD, no thyromegally Lymphatics:  No adenopathy Back:  No CVA tenderness Lungs:  Clear with no wheezes HEART:  Regular rate rhythm, no murmurs, no rubs, no clicks Abd:  soft, positive bowel sounds, no organomegally, no rebound, no guarding Ext:  2 plus pulses, no edema, no cyanosis, no clubbing Skin:  No rashes no nodules Neuro:  CN II through XII intact, motor grossly intact  EKG NSR  Assess/Plan: 1. PAF - he will continue dofetilide. His QT looks good and he is maintaining NSR. 2. HTN - his bp is a little high today but has mostly been controlled. We discussed the importance of a low sodium diet. 3. CAD - he is s/p CABG and has no anginal symptoms.  [name] [name].D.

## 2018-05-07 NOTE — Patient Instructions (Signed)
Your physician wants you to follow-up in: 1 year with Dr. Taylor. You will receive a reminder letter in the mail two months in advance. If you don't receive a letter, please call our office to schedule the follow-up appointment.  Your physician recommends that you continue on your current medications as directed. Please refer to the Current Medication list given to you today.  

## 2018-05-28 ENCOUNTER — Other Ambulatory Visit: Payer: Self-pay | Admitting: Family Medicine

## 2018-05-31 ENCOUNTER — Ambulatory Visit (INDEPENDENT_AMBULATORY_CARE_PROVIDER_SITE_OTHER): Payer: Medicare Other

## 2018-05-31 DIAGNOSIS — Z23 Encounter for immunization: Secondary | ICD-10-CM

## 2018-06-09 ENCOUNTER — Other Ambulatory Visit: Payer: Self-pay | Admitting: Family Medicine

## 2018-06-11 ENCOUNTER — Other Ambulatory Visit: Payer: Self-pay | Admitting: *Deleted

## 2018-06-15 DIAGNOSIS — L570 Actinic keratosis: Secondary | ICD-10-CM | POA: Diagnosis not present

## 2018-07-16 DIAGNOSIS — L570 Actinic keratosis: Secondary | ICD-10-CM | POA: Diagnosis not present

## 2018-08-10 ENCOUNTER — Ambulatory Visit: Payer: Medicare Other | Admitting: Family Medicine

## 2018-08-13 DIAGNOSIS — J209 Acute bronchitis, unspecified: Secondary | ICD-10-CM | POA: Diagnosis not present

## 2018-08-13 DIAGNOSIS — B9689 Other specified bacterial agents as the cause of diseases classified elsewhere: Secondary | ICD-10-CM | POA: Diagnosis not present

## 2018-08-13 DIAGNOSIS — K219 Gastro-esophageal reflux disease without esophagitis: Secondary | ICD-10-CM | POA: Diagnosis not present

## 2018-08-13 DIAGNOSIS — J019 Acute sinusitis, unspecified: Secondary | ICD-10-CM | POA: Diagnosis not present

## 2018-08-27 ENCOUNTER — Telehealth: Payer: Self-pay | Admitting: Family Medicine

## 2018-08-27 ENCOUNTER — Other Ambulatory Visit: Payer: Self-pay | Admitting: *Deleted

## 2018-08-27 NOTE — Telephone Encounter (Signed)
Refill encounter done

## 2018-08-27 NOTE — Telephone Encounter (Addendum)
Patient came in with these 2 bottles for refills.  These were last filled by [facility]. Patient says he is switching pharmacies to [place] [place]. I spoke with the patient and explained that he probably needed to get [facility] to fill them and he says that you have told him you will take over all of his medicines and he didn't remember seeing Cardiology. Last office visit:   07/15/18 Acute  Upcoming CPE and labs scheduled later this month. Last Filled:   Eliquis  By Dr. [name] 180 tablet 3 05/07/2018  Last Filled:   Tikosyn by Dr. [name] 180 capsule 3 05/07/2018  Please advise.

## 2018-08-27 NOTE — Telephone Encounter (Signed)
I am okay filling his Eliquis prescription if cardiology is okay with that.  The dofetilide prescription still needs to come through cardiology.  I routed this over to Dr. [name] for his input in the meantime.  Thanks.

## 2018-08-27 NOTE — Telephone Encounter (Signed)
Pt needs refills on Eliquis and Dofetilide. He's requesting 90 days instead of 30. He wants them to be called into [facility].

## 2018-08-28 DIAGNOSIS — L821 Other seborrheic keratosis: Secondary | ICD-10-CM | POA: Diagnosis not present

## 2018-08-28 DIAGNOSIS — L570 Actinic keratosis: Secondary | ICD-10-CM | POA: Diagnosis not present

## 2018-08-30 ENCOUNTER — Ambulatory Visit: Payer: Medicare Other

## 2018-09-04 ENCOUNTER — Encounter: Payer: Self-pay | Admitting: Family Medicine

## 2018-09-04 ENCOUNTER — Ambulatory Visit (INDEPENDENT_AMBULATORY_CARE_PROVIDER_SITE_OTHER): Payer: PPO | Admitting: Family Medicine

## 2018-09-04 DIAGNOSIS — R05 Cough: Secondary | ICD-10-CM

## 2018-09-04 DIAGNOSIS — R059 Cough, unspecified: Secondary | ICD-10-CM

## 2018-09-04 MED ORDER — HYDROCODONE-HOMATROPINE 5-1.5 MG/5ML PO SYRP: 5.0000 mL | ORAL_SOLUTION | Freq: Three times a day (TID) | ORAL | 0 refills | Status: DC | PRN

## 2018-09-04 NOTE — Patient Instructions (Signed)
Likely a post infectious cough.   Use the cough medicine and then update me as needed.   You should gradually get better.  Take care.  Glad to see you.

## 2018-09-04 NOTE — Progress Notes (Signed)
Cough for 6 weeks.  Chest sore from coughing.  Previously seen in urgent care.  Sitting back and putting pressure on his back chest wall causes the cough.  Prev neg CXR and done with abx and steroids currently.  No wheeze except for rarely.  Coughing comes in fits.  Talking causes the cough.    No FCNAVD.  He doesn't feel sick except for the cough.  Albuterol and tessalon didn't help.    Meds, vitals, and allergies reviewed.   ROS: Per HPI unless specifically indicated in ROS section   GEN: nad, alert and oriented HEENT: mucous membranes moist NECK: supple w/o LA CV: rrr PULM: ctab, no inc wob, cough noted when he sits back in a chair.  When he leans back but does not have his back touching the posterior portion of the chair, he does not have a cough trigger. ABD: soft, +bs EXT: no edema SKIN: no acute rash

## 2018-09-05 NOTE — Assessment & Plan Note (Signed)
Likely a post infectious cough.   Use hydrocodone cough syrup with sedation caution and then update me as needed.   He should gradually get better.  He agrees with plan.  We talked about his other blood pressure and heart medications.  Some of his medications like dofetilide do need to come to the cardiac clinic.  He agrees.

## 2018-09-06 ENCOUNTER — Encounter: Payer: Medicare Other | Admitting: Family Medicine

## 2018-09-12 ENCOUNTER — Telehealth: Payer: Self-pay | Admitting: Internal Medicine

## 2018-09-12 MED ORDER — DOFETILIDE 500 MCG PO CAPS: 500.0000 ug | ORAL_CAPSULE | Freq: Two times a day (BID) | ORAL | 2 refills | Status: DC

## 2018-09-12 NOTE — Telephone Encounter (Signed)
Pt's medication was sent to pt's pharmacy as requested. Confirmation received.  °

## 2018-09-12 NOTE — Telephone Encounter (Signed)
New message    *STAT* If patient is at the pharmacy, call can be transferred to refill team.   1. Which medications need to be refilled? (please list name of each medication and dose if known) dofetilide (TIKOSYN) 500 MCG capsule  2. Which pharmacy/location (including street and city if local pharmacy) is medication to be sent to?[facility] [place]  3. Do they need a 30 day or 90 day supply? [place]

## 2018-09-13 ENCOUNTER — Ambulatory Visit: Payer: Self-pay

## 2018-09-13 ENCOUNTER — Other Ambulatory Visit (INDEPENDENT_AMBULATORY_CARE_PROVIDER_SITE_OTHER): Payer: PPO

## 2018-09-13 ENCOUNTER — Other Ambulatory Visit: Payer: Self-pay | Admitting: Family Medicine

## 2018-09-13 DIAGNOSIS — E1149 Type 2 diabetes mellitus with other diabetic neurological complication: Secondary | ICD-10-CM | POA: Diagnosis not present

## 2018-09-13 DIAGNOSIS — Z125 Encounter for screening for malignant neoplasm of prostate: Secondary | ICD-10-CM

## 2018-09-13 LAB — CBC WITH DIFFERENTIAL/PLATELET
Basophils Absolute: 0 10*3/uL (ref 0.0–0.1)
Basophils Relative: 0.6 % (ref 0.0–3.0)
Eosinophils Absolute: 0.1 10*3/uL (ref 0.0–0.7)
Eosinophils Relative: 2.4 % (ref 0.0–5.0)
HEMATOCRIT: 43.3 % (ref 39.0–52.0)
Hemoglobin: 15 g/dL (ref 13.0–17.0)
Lymphocytes Relative: 36.1 % (ref 12.0–46.0)
Lymphs Abs: 2.2 10*3/uL (ref 0.7–4.0)
MCHC: 34.6 g/dL (ref 30.0–36.0)
MCV: 94 fl (ref 78.0–100.0)
MONOS PCT: 7.7 % (ref 3.0–12.0)
Monocytes Absolute: 0.5 10*3/uL (ref 0.1–1.0)
NEUTROS ABS: 3.3 10*3/uL (ref 1.4–7.7)
Neutrophils Relative %: 53.2 % (ref 43.0–77.0)
Platelets: 166 10*3/uL (ref 150.0–400.0)
RBC: 4.61 Mil/uL (ref 4.22–5.81)
RDW: 14 % (ref 11.5–15.5)
WBC: 6.2 10*3/uL (ref 4.0–10.5)

## 2018-09-13 LAB — COMPREHENSIVE METABOLIC PANEL
ALT: 22 U/L (ref 0–53)
AST: 20 U/L (ref 0–37)
Albumin: 4.4 g/dL (ref 3.5–5.2)
Alkaline Phosphatase: 82 U/L (ref 39–117)
BILIRUBIN TOTAL: 1.2 mg/dL (ref 0.2–1.2)
BUN: 12 mg/dL (ref 6–23)
CALCIUM: 9.5 mg/dL (ref 8.4–10.5)
CO2: 31 meq/L (ref 19–32)
Chloride: 100 mEq/L (ref 96–112)
Creatinine, Ser: 0.76 mg/dL (ref 0.40–1.50)
GFR: 99.14 mL/min (ref >60.00)
Glucose, Bld: 167 mg/dL — ABNORMAL HIGH (ref 70–99)
Potassium: 4.2 mEq/L (ref 3.5–5.1)
Sodium: 137 mEq/L (ref 135–145)
Total Protein: 6.6 g/dL (ref 6.0–8.3)

## 2018-09-13 LAB — PSA, MEDICARE: PSA: 0.48 ng/ml (ref 0.10–4.00)

## 2018-09-13 LAB — LIPID PANEL
Cholesterol: 119 mg/dL (ref 0–200)
HDL: 25.2 mg/dL — ABNORMAL LOW (ref >39.00)
NonHDL: 94.02
Total CHOL/HDL Ratio: 5
Triglycerides: 284 mg/dL — ABNORMAL HIGH (ref 0.0–149.0)
VLDL: 56.8 mg/dL — AB (ref 0.0–40.0)

## 2018-09-13 LAB — TSH: TSH: 1.65 u[IU]/mL (ref 0.35–4.50)

## 2018-09-13 LAB — LDL CHOLESTEROL, DIRECT: Direct LDL: 43 mg/dL

## 2018-09-13 LAB — HEMOGLOBIN A1C: Hgb A1c MFr Bld: 8.5 % — ABNORMAL HIGH (ref 4.6–6.5)

## 2018-09-20 ENCOUNTER — Ambulatory Visit (INDEPENDENT_AMBULATORY_CARE_PROVIDER_SITE_OTHER): Payer: PPO | Admitting: Family Medicine

## 2018-09-20 ENCOUNTER — Encounter: Payer: Self-pay | Admitting: Family Medicine

## 2018-09-20 VITALS — BP 150/74 | HR 55 | Temp 98.0°F | Ht 67.0 in | Wt 163.5 lb

## 2018-09-20 DIAGNOSIS — R079 Chest pain, unspecified: Secondary | ICD-10-CM

## 2018-09-20 DIAGNOSIS — R059 Cough, unspecified: Secondary | ICD-10-CM

## 2018-09-20 DIAGNOSIS — I10 Essential (primary) hypertension: Secondary | ICD-10-CM

## 2018-09-20 DIAGNOSIS — E7800 Pure hypercholesterolemia, unspecified: Secondary | ICD-10-CM | POA: Diagnosis not present

## 2018-09-20 DIAGNOSIS — R05 Cough: Secondary | ICD-10-CM

## 2018-09-20 DIAGNOSIS — E1149 Type 2 diabetes mellitus with other diabetic neurological complication: Secondary | ICD-10-CM

## 2018-09-20 DIAGNOSIS — Z7189 Other specified counseling: Secondary | ICD-10-CM

## 2018-09-20 DIAGNOSIS — Z0000 Encounter for general adult medical examination without abnormal findings: Secondary | ICD-10-CM

## 2018-09-20 MED ORDER — GABAPENTIN 600 MG PO TABS: 600.0000 mg | ORAL_TABLET | Freq: Three times a day (TID) | ORAL | Status: DC | PRN

## 2018-09-20 MED ORDER — METFORMIN HCL 1000 MG PO TABS: 1000.0000 mg | ORAL_TABLET | Freq: Two times a day (BID) | ORAL | 3 refills | Status: DC

## 2018-09-20 NOTE — Patient Instructions (Addendum)
Check with your insurance to see if they will cover the shingrix and tetanus shot. Change metformin to 1000mg  and let me know how you feel in about 2 weeks.  Recheck in about 3 months with labs at the visit. Update me as needed.   Keep the cardiology appointment.   Take care.  Glad to see you.  Tikosyn needs to come through cardiology.  I can work on the other meds when you need refills.

## 2018-09-20 NOTE — Progress Notes (Signed)
I have personally reviewed the Medicare Annual Wellness questionnaire and have noted 1. The patient's medical and social history 2. Their use of alcohol, tobacco or illicit drugs 3. Their current medications and supplements 4. The patient's functional ability including ADL's, fall risks, home safety risks and hearing or visual             impairment. 5. Diet and physical activities 6. Evidence for depression or mood disorders  The patients weight, height, BMI have been recorded in the chart and visual acuity is per eye clinic.  I have made referrals, counseling and provided education to the patient based review of the above and I have provided the pt with a written personalized care plan for preventive services.  Provider list updated- see scanned forms.  Routine anticipatory guidance given to patient.  See health maintenance. The possibility exists that previously documented standard health maintenance information may have been brought forward from a previous encounter into this note.  If needed, that same information has been updated to reflect the current situation based on today's encounter.    Flu 2019 Shingles out of stock PNA UTD Tetanus 2010, d/w pt.  Colonoscopy 2018 PSA 2020 Advance directive- wife or daughter designated if patient were incapacitated.   Cognitive function addressed- see scanned forms- and if abnormal then additional documentation follows.   Hypertension:    Using medication without problems or lightheadedness: yes Chest pain with exertion: only with sig exertion, not currently.  Stable over the last year.   Edema:no Short of breath:no Labs d/w pt.   Elevated Cholesterol: Using medications without problems:yes Muscle aches: no Diet compliance:yes Exercise:yes Statin intolerant.    Cough.  Is better in the meantime, no fevers. No sputum usually but some occ clear sputum, more at night.    Diabetes:  Using medications without  difficulties:yes Hypoglycemic episodes:no sx Hyperglycemic episodes: no sx Feet problems:some stinging but better than prev.   Blood Sugars averaging: not checked.   eye exam within last year: pending.  A1c up, d/w pt.   PMH and SH reviewed  Meds, vitals, and allergies reviewed.   ROS: Per HPI.  Unless specifically indicated otherwise in HPI, the patient denies:  General: fever. Eyes: acute vision changes ENT: sore throat Cardiovascular: chest pain Respiratory: SOB GI: vomiting GU: dysuria Musculoskeletal: acute back pain Derm: acute rash Neuro: acute motor dysfunction Psych: worsening mood Endocrine: polydipsia Heme: bleeding Allergy: hayfever  GEN: nad, alert and oriented HEENT: mucous membranes moist NECK: supple w/o LA CV: rrr. PULM: ctab, no inc wob ABD: soft, +bs EXT: no edema SKIN: no acute rash  Diabetic foot exam: Normal inspection No skin breakdown No calluses  Normal DP pulses Normal sensation to light touch and monofilament Nails normal

## 2018-09-27 NOTE — Assessment & Plan Note (Signed)
I want him to work on diet and he can recheck his BP at home.   Labs d/w pt.  >25 minutes spent in face to face time with patient, >50% spent in counselling or coordination of care.

## 2018-09-27 NOTE — Assessment & Plan Note (Signed)
Advance directive- wife or daughter designated if patient were incapacitated.

## 2018-09-27 NOTE — Assessment & Plan Note (Signed)
A1c up, d/w pt.  Change metformin to 1000mg  and he'll let me know he feels in about 2 weeks.  Recheck in about 3 months with labs at the visit. Update me as needed.

## 2018-09-27 NOTE — Assessment & Plan Note (Signed)
Improved, ctab.  D/w pt.

## 2018-09-27 NOTE — Assessment & Plan Note (Signed)
Flu 2019 Shingles out of stock PNA UTD Tetanus 2010, d/w pt.  Colonoscopy 2018 PSA 2020 Advance directive- wife or daughter designated if patient were incapacitated.   Cognitive function addressed- see scanned forms- and if abnormal then additional documentation follows.

## 2018-09-27 NOTE — Assessment & Plan Note (Addendum)
Chest pain with exertion: only with sig exertion, not currently.  Stable over the last year.   No change in meds at this point.  He has cardiology f/u pending and I will update them about his situation.    Routine cautions d/w pt.  Limit extremes of exertion.

## 2018-09-27 NOTE — Assessment & Plan Note (Signed)
Statin intolerant, continue work on diet.

## 2018-10-02 NOTE — Telephone Encounter (Signed)
Please advise if you have gotten input back from cardiology and okay to close encounter.

## 2018-10-03 ENCOUNTER — Other Ambulatory Visit: Payer: Self-pay

## 2018-10-03 MED ORDER — AMLODIPINE BESYLATE 5 MG PO TABS: 5.0000 mg | ORAL_TABLET | Freq: Every day | ORAL | 1 refills | Status: DC

## 2018-10-03 MED ORDER — APIXABAN 5 MG PO TABS: 5.0000 mg | ORAL_TABLET | Freq: Two times a day (BID) | ORAL | 3 refills | Status: DC

## 2018-10-03 NOTE — Telephone Encounter (Signed)
I do not see report back from cardiology about the dofetilide but it is listed as coming through cardiology.  Patient should have enough of that medication for now and I had previously talked to the patient about this.  I will defer for now. I sent his Eliquis so that he would not run out.   He has cardiology follow-up pending.   Thanks.

## 2018-10-09 DIAGNOSIS — H40021 Open angle with borderline findings, high risk, right eye: Secondary | ICD-10-CM | POA: Diagnosis not present

## 2018-10-09 DIAGNOSIS — B0052 Herpesviral keratitis: Secondary | ICD-10-CM | POA: Diagnosis not present

## 2018-10-11 ENCOUNTER — Ambulatory Visit (INDEPENDENT_AMBULATORY_CARE_PROVIDER_SITE_OTHER): Payer: PPO | Admitting: Family Medicine

## 2018-10-11 ENCOUNTER — Encounter: Payer: Self-pay | Admitting: Family Medicine

## 2018-10-11 VITALS — BP 140/68 | HR 56 | Temp 97.7°F | Ht 67.0 in | Wt 166.4 lb

## 2018-10-11 DIAGNOSIS — Q383 Other congenital malformations of tongue: Secondary | ICD-10-CM | POA: Diagnosis not present

## 2018-10-11 DIAGNOSIS — R0683 Snoring: Secondary | ICD-10-CM | POA: Diagnosis not present

## 2018-10-11 NOTE — Progress Notes (Signed)
Still on eliquis. He had an irritated lesion on his tongue about 6 months ago.  It bled a little, then resolved.  Then it happened again about 5 months ago.  It resolved again.  Now with another episode today.  It has been happening about 1-2 times per month.  It is always at the same spot on the L side of the tongue but the irritated area will heal over and not be seen easily between episodes. He never saw an ulcer.  He has dentures.  He didn't recall biting his tongue.     No other bleeding.  No black stools.  No FCNAVD.  No other spots noted in his mouth.     He didn't smoke or dip.    He has been taking claritin for allergies.    He snores.  He can wake himself from snoring, occ gasping for air.  He doesn't wake tired.  No AM headaches.  Discussed with patient about pulmonary referral for sleep apnea evaluation.  Meds, vitals, and allergies reviewed.   ROS: Per HPI unless specifically indicated in ROS section   GEN: nad, alert and oriented HEENT: mucous membranes moist Normal tongue exam.  No lesions in the mouth.  No ulceration. No blood on exam now.   NECK: supple w/o LA CV: rrr. PULM: ctab, no inc wob ABD: soft, +bs

## 2018-10-11 NOTE — Patient Instructions (Signed)
We'll call about getting you an appointment with pulmonary to check you for sleep apnea.    Call the dental clinic and tell them about the situation, just to see if they have any ideas.   I don't see any spots now but if you have another episode then take a picture of it and let me know.    Take care.  Glad to see you.

## 2018-10-14 DIAGNOSIS — R0683 Snoring: Secondary | ICD-10-CM | POA: Insufficient documentation

## 2018-10-14 DIAGNOSIS — Q383 Other congenital malformations of tongue: Secondary | ICD-10-CM | POA: Insufficient documentation

## 2018-10-14 NOTE — Assessment & Plan Note (Addendum)
Reported by patient as a recurrent issue but we both agree that no lesion is visible now.  He has no other bleeding.  I do not see any abnormality in the mouth that would direct biopsy or other treatment at this point.   I would not stop anticoagulation at this point.  He agrees.  We talked about options.  I would like him to talk to the dental clinic when possible, to get their input.  If he notes any other changes then I want him to take a picture of the lesion immediately and update me.  He agrees.  Unclear to me if he could have a very small abrasion related to his denture that had already healed over before I could check in today.

## 2018-10-14 NOTE — Assessment & Plan Note (Signed)
Discussed with patient about pulmonary referral for sleep apnea evaluation.

## 2018-10-23 ENCOUNTER — Telehealth: Payer: Self-pay | Admitting: Family Medicine

## 2018-10-23 NOTE — Telephone Encounter (Signed)
Pt calling and stated his tongue is bleeding. Pt was told by Dr Damita Dunnings to take a picture of it. Pt need to be advise what to do.

## 2018-10-23 NOTE — Telephone Encounter (Signed)
Either sign up for mychart and send it or print the picture and send it here.  Thanks.

## 2018-10-23 NOTE — Telephone Encounter (Signed)
Patient is not signed up for MyChart so he has no way to relay the picture.  Please advise.

## 2018-10-23 NOTE — Telephone Encounter (Signed)
Patient advised.

## 2018-10-26 ENCOUNTER — Encounter: Payer: Self-pay | Admitting: Family Medicine

## 2018-10-26 ENCOUNTER — Other Ambulatory Visit: Payer: Self-pay

## 2018-10-26 ENCOUNTER — Telehealth: Payer: Self-pay | Admitting: Cardiovascular Disease

## 2018-10-26 ENCOUNTER — Ambulatory Visit (INDEPENDENT_AMBULATORY_CARE_PROVIDER_SITE_OTHER)
Admission: RE | Admit: 2018-10-26 | Discharge: 2018-10-26 | Disposition: A | Discharge status: Home / Self Care | Payer: PPO | Source: Ambulatory Visit | Attending: Family Medicine | Admitting: Family Medicine

## 2018-10-26 ENCOUNTER — Ambulatory Visit (INDEPENDENT_AMBULATORY_CARE_PROVIDER_SITE_OTHER): Payer: PPO | Admitting: Family Medicine

## 2018-10-26 VITALS — BP 142/64 | HR 70 | Temp 98.4°F | Ht 67.0 in | Wt 164.5 lb

## 2018-10-26 DIAGNOSIS — Q383 Other congenital malformations of tongue: Secondary | ICD-10-CM | POA: Diagnosis not present

## 2018-10-26 DIAGNOSIS — Z23 Encounter for immunization: Secondary | ICD-10-CM

## 2018-10-26 DIAGNOSIS — R05 Cough: Secondary | ICD-10-CM

## 2018-10-26 DIAGNOSIS — R059 Cough, unspecified: Secondary | ICD-10-CM

## 2018-10-26 DIAGNOSIS — S60511A Abrasion of right hand, initial encounter: Secondary | ICD-10-CM | POA: Diagnosis not present

## 2018-10-26 NOTE — Patient Instructions (Signed)
Keep the area clean and covered.  Rinse with soapy water and pat dry to clean.  Update me as needed.  Go to the lab on the way out.  We'll contact you with your xray report. Take care.  Glad to see you.

## 2018-10-26 NOTE — Progress Notes (Signed)
We talked about his tongue situation.  It turned out it was local irritation related to B12 pills.  He noted the same type of spot in a different place after using the B12 pill in another location in the mouth.  He isn't having troubles otherwise.    Hand injury.  He was working on a pump on a tractor.  Last night.  Scraped the skin off.  Flap removed by patient.  Cleaned last night.   Still with cough, ongoing from illness in 08/2018.  Some clear sputum.  No fevers.  No vomiting.  No wheeze.  He clearly feels better after getting  Some sputum up. Clear rhinorrhea in the AM.  Some nighttime cough. He clearly feels better than with prev illness.  He doesn't feel unwell.    Meds, vitals, and allergies reviewed.   ROS: Per HPI unless specifically indicated in ROS section    nad ncat OP wnl except for minimal erythema on the L side of the tongue where he had recently used a B12 lozenge. ctab rrr R hand with 1x2cm superficial abraded area on the dorsum at proximal 2nd MC.  Appears clean, minimal oozing blood.  No flap to reapproximate.  Normal hand ROM.

## 2018-10-26 NOTE — Telephone Encounter (Signed)
Left voicemail message for patient to call back for review of prescreening questions and appointment information.

## 2018-10-26 NOTE — Telephone Encounter (Signed)
   Cardiac Questionnaire:    Since your last visit or hospitalization:    1. Have you been having new or worsening chest pain? No   2. Have you been having new or worsening shortness of breath? No 3. Have you been having new or worsening leg swelling, wt gain, or increase in abdominal girth (pants fitting more tightly)? No   4. Have you had any passing out spells? No   Patient called and states that he is agreeable to cancel appointment until later date. Denies any problems at this time and will route to pool for rescheduling.

## 2018-10-26 NOTE — Telephone Encounter (Signed)
Duplicate entry

## 2018-10-28 DIAGNOSIS — S60519A Abrasion of unspecified hand, initial encounter: Secondary | ICD-10-CM | POA: Insufficient documentation

## 2018-10-28 MED ORDER — LORATADINE 10 MG PO TABS: 10.0000 mg | ORAL_TABLET | Freq: Every day | ORAL | Status: DC

## 2018-10-28 NOTE — Assessment & Plan Note (Signed)
Related to B12 use.  No follow-up needed.

## 2018-10-28 NOTE — Assessment & Plan Note (Signed)
See notes on chest x-ray.  Lungs are clear.  Well-appearing.  Okay for outpatient follow-up.

## 2018-10-28 NOTE — Assessment & Plan Note (Signed)
Tetanus shot today, appears clean.  No need for oral antibiotics at this point.  Covered with Neosporin and a nonstick bandage.  Routine instructions given.  See after visit summary.  He agrees.  It should scab over and heal.

## 2018-10-30 ENCOUNTER — Ambulatory Visit: Payer: Medicare Other | Admitting: Cardiovascular Disease

## 2018-11-08 NOTE — Telephone Encounter (Signed)
LMOVM to CB

## 2018-11-16 ENCOUNTER — Other Ambulatory Visit: Payer: Self-pay | Admitting: *Deleted

## 2018-11-16 MED ORDER — EZETIMIBE 10 MG PO TABS: 10.0000 mg | ORAL_TABLET | Freq: Every day | ORAL | 0 refills | Status: DC

## 2018-11-29 NOTE — Telephone Encounter (Signed)
Called patient.  Patient wanted to push appointment back until the office reopens.  Recall is placed for August.

## 2019-01-09 ENCOUNTER — Telehealth: Payer: Self-pay

## 2019-01-09 NOTE — Telephone Encounter (Signed)
Spoke with patient.  Made him aware that Dr. Rockey Situ was still limiting patients in the office due to [place].  Offered telehealth visit. Patient declined.  Would like to wait for an in office appt.

## 2019-01-17 ENCOUNTER — Ambulatory Visit: Payer: PPO | Admitting: Cardiovascular Disease

## 2019-01-22 ENCOUNTER — Institutional Professional Consult (permissible substitution): Payer: PPO | Admitting: Internal Medicine

## 2019-01-26 ENCOUNTER — Other Ambulatory Visit: Payer: Self-pay | Admitting: Cardiovascular Disease

## 2019-01-28 NOTE — Telephone Encounter (Signed)
Left message for patient to call back to schedule F/U appointment with Dr. Rockey Situ.

## 2019-02-28 ENCOUNTER — Other Ambulatory Visit: Payer: Self-pay

## 2019-02-28 ENCOUNTER — Encounter: Payer: Self-pay | Admitting: Family Medicine

## 2019-02-28 ENCOUNTER — Ambulatory Visit (INDEPENDENT_AMBULATORY_CARE_PROVIDER_SITE_OTHER): Payer: PPO | Admitting: Family Medicine

## 2019-02-28 VITALS — BP 152/64 | HR 51 | Temp 98.8°F | Ht 67.0 in | Wt 167.0 lb

## 2019-02-28 DIAGNOSIS — W57XXXA Bitten or stung by nonvenomous insect and other nonvenomous arthropods, initial encounter: Secondary | ICD-10-CM

## 2019-02-28 DIAGNOSIS — E1149 Type 2 diabetes mellitus with other diabetic neurological complication: Secondary | ICD-10-CM

## 2019-02-28 DIAGNOSIS — R42 Dizziness and giddiness: Secondary | ICD-10-CM

## 2019-02-28 DIAGNOSIS — S00261A Insect bite (nonvenomous) of right eyelid and periocular area, initial encounter: Secondary | ICD-10-CM

## 2019-02-28 LAB — BASIC METABOLIC PANEL WITH GFR
BUN: 17 mg/dL (ref 6–23)
CO2: 28 meq/L (ref 19–32)
Calcium: 9.8 mg/dL (ref 8.4–10.5)
Chloride: 103 meq/L (ref 96–112)
Creatinine, Ser: 0.88 mg/dL (ref 0.40–1.50)
GFR: 83.61 mL/min
Glucose, Bld: 148 mg/dL — ABNORMAL HIGH (ref 70–99)
Potassium: 4.5 meq/L (ref 3.5–5.1)
Sodium: 140 meq/L (ref 135–145)

## 2019-02-28 LAB — HEMOGLOBIN A1C: Hgb A1c MFr Bld: 7 % — ABNORMAL HIGH (ref 4.6–6.5)

## 2019-02-28 NOTE — Progress Notes (Signed)
Pandemic considerations d/w pt.    Recent tick bite.  Right upper eyelid.  Removed the same AM as attachment after working outside.  The tick was brought in for identification.  It does not have a white diet so it does not at all look like a [place] tick.  It looks more like a dog tick.  He is still having vertigo episodically.  Room spinning, positional.  Better today and yesterday but prev recurrent.  Can have sx when laying down.  No syncope.    DM2.  Not checking sugar.  Due for labs.  See notes on labs. Compliant with metformin.  Meds, vitals, and allergies reviewed.   ROS: Per HPI unless specifically indicated in ROS section   nad ncat except for R upper eyelid with residual tick bite site.  No seen retained FB.  No spreading erythema.  No discharge. TM wnl B Neck supple, no LA rrr ctab Ext well perfused.  Strength and sensation grossly intact in the extremities x4. Not lightheaded on standing.

## 2019-02-28 NOTE — Patient Instructions (Addendum)
Regardless of the species, you should be low risk since it was removed the same morning as attachment.   The site looks fine.  If any fever or rash then let me know.    Go to the lab on the way out.  We'll contact you with your lab report. Try the bedside exercise for vertigo.   Take care.  Glad to see you.

## 2019-03-04 DIAGNOSIS — R42 Dizziness and giddiness: Secondary | ICD-10-CM | POA: Insufficient documentation

## 2019-03-04 NOTE — Assessment & Plan Note (Signed)
Not presyncope.  He can try home bedside exercises and then update me as needed.

## 2019-03-04 NOTE — Assessment & Plan Note (Signed)
Regardless of the species, he should be low risk since it was removed the same morning as attachment.   The site looks fine.  If any fever or rash then he will let me know.   All discussed with patient. >25 minutes spent in face to face time with patient, >50% spent in counselling or coordination of care.

## 2019-03-04 NOTE — Assessment & Plan Note (Signed)
No change in meds at this point.  See notes on labs. 

## 2019-03-16 NOTE — Progress Notes (Signed)
Cardiology Office Note  Date:  03/18/2019   ID:  [name] [name], [name] [name] 01/19/1941, MRN [id number]  PCP:  Tonia Ghent, MD   Chief Complaint  Patient presents with  . Other    12 month follow up. Patient c/o occ left sided chest pain and dizziness. Meds reviewed verbally with patient.     HPI:  [name] [name] is a 78 year old gentleman with past medical history of Hypertension Diabetes Coronary artery disease, bypass surgery 6  2015 Ejection fraction 55% by echo July 2018 mildly dilated aortic root, PFO Previous strokes,  atrial fib, on eliquis, CHA2DS2VASc = 7);  Cardioversion 08/24/2017 admitted February 2019  placed on dofetilide and reverted back to NSR.   No prolongation of the QT interval.  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 Redmond Pulling   PMH:   has a past medical history of Anxiety, Back pain, Coronary artery disease (08/21/2013), Diabetes mellitus, Diabetic neuropathy ([place]), Diastolic dysfunction, Diverticulosis, Diverticulosis, Embolic stroke ([place]), Erectile dysfunction, Family history of anesthesia complication, Fatty liver, Flank pain, GERD (gastroesophageal reflux disease), History of bronchitis, History of chicken pox, HSV infection, Hypercholesterolemia, Hypertension, Kidney  stones, Osteoarthritis, Persistent atrial fibrillation, and PFO (patent foramen ovale).  PSH:    Past Surgical History:  Procedure Laterality Date  . CARDIAC CATHETERIZATION  08/21/2013   DR Angelena Form  . CARDIOVERSION N/A 08/24/2017   Procedure: CARDIOVERSION;  Surgeon: Minna Merritts, MD;  Location: [facility] ORS;  Service: Cardiovascular;  Laterality: N/A;  . CHOLECYSTECTOMY  1983  . CORONARY ARTERY BYPASS GRAFT N/A 08/23/2013   Procedure: CORONARY ARTERY BYPASS GRAFTING (CABG);  Surgeon: Grace Isaac, MD;  Location: [place];  Service: Open Heart Surgery;  Laterality: N/A;  CABG times six utilizing the left internal mammary artery and the right greater saphenous vein harvested endoscopically  . ENDOVEIN HARVEST OF GREATER SAPHENOUS VEIN Right 08/23/2013   Procedure: ENDOVEIN HARVEST OF GREATER SAPHENOUS VEIN;  Surgeon: Grace Isaac, MD;  Location: [place];  Service: Open Heart Surgery;  Laterality: Right;  . HERNIA REPAIR     X3  . INTRAOPERATIVE TRANSESOPHAGEAL ECHOCARDIOGRAM N/A 08/23/2013   Procedure: INTRAOPERATIVE TRANSESOPHAGEAL ECHOCARDIOGRAM;  Surgeon: Grace Isaac, MD;  Location: [place];  Service: Open Heart Surgery;  Laterality: N/A;  . LEFT HEART CATHETERIZATION WITH CORONARY ANGIOGRAM N/A 08/21/2013   Procedure: LEFT HEART CATHETERIZATION WITH CORONARY ANGIOGRAM;  Surgeon: Burnell Blanks, MD;  Location: [facility] CATH LAB;  Service: Cardiovascular;  Laterality: N/A;    Current Outpatient Medications  Medication Sig Dispense Refill  . acyclovir (ZOVIRAX) 800 MG tablet Take 1 tablet (800 mg total) by mouth 2 (two) times daily. 180 tablet 3  . amLODipine (NORVASC) 5 MG tablet Take 1 tablet (5 mg total) by mouth daily. 90 tablet 1  . apixaban (ELIQUIS) 5 MG TABS  tablet Take 1 tablet (5 mg total) by mouth 2 (two) times daily. 180 tablet 3  . COD LIVER OIL PO Take 1 capsule by mouth 2 (two) times daily.    Marland Kitchen dofetilide (TIKOSYN) 500 MCG capsule Take 1 capsule (500 mcg total) by  mouth 2 (two) times daily. 180 capsule 2  . ezetimibe (ZETIA) 10 MG tablet TAKE 1 TABLET BY MOUTH EVERY DAY 90 tablet 0  . gabapentin (NEURONTIN) 600 MG tablet Take 1 tablet (600 mg total) by mouth 3 (three) times daily as needed.    . loratadine (CLARITIN) 10 MG tablet Take 1 tablet (10 mg total) by mouth daily.    Marland Kitchen losartan (COZAAR) 100 MG tablet Take 1 tablet (100 mg total) by mouth daily. 90 tablet 3  . metFORMIN (GLUCOPHAGE) 1000 MG tablet Take 1 tablet (1,000 mg total) by mouth 2 (two) times daily with a meal. 180 tablet 3  . metoprolol tartrate (LOPRESSOR) 25 MG tablet Take 0.5 tablets (12.5 mg total) by mouth 2 (two) times daily. 90 tablet 3  . nitroGLYCERIN (NITROSTAT) 0.4 MG SL tablet Place 1 tablet (0.4 mg total) under the tongue every 5 (five) minutes as needed for chest pain (max 3 doses). 25 tablet 6  . timolol (BETIMOL) 0.5 % ophthalmic solution Place 1 drop into both eyes 2 (two) times daily.     No current facility-administered medications for this visit.      Allergies:   Fenofibrate and Statins   Social History:  The patient  reports that he has never smoked. He has never used smokeless tobacco. He reports that he does not drink alcohol or use drugs.   Family History:   family history includes CAD in his brother; Colon cancer in his maternal grandmother; Diabetes in his mother; Heart disease in his father; Parkinsonism in his maternal grandfather; Prostate cancer in his maternal uncle; Stroke in his mother and paternal grandfather.    Review of Systems: Review of Systems  Constitutional: Negative.   Respiratory: Negative.   Cardiovascular: Negative.   Gastrointestinal: Negative.   Musculoskeletal: Negative.        Leg pain, foot pain  Neurological: Negative.   Psychiatric/Behavioral: Negative.   All other systems reviewed and are negative.   PHYSICAL EXAM: VS:  Ht 5\' 7"  (1.702 m)   Wt 164 lb 12 oz (74.7 kg)   BMI 25.80 kg/m  , BMI Body mass index is 25.8  kg/m. GEN: Well nourished, well developed, in no acute distress  HEENT: normal  Neck: no JVD,  or masses, 1+ bruit on the right Cardiac: Bradycardic regular ; no murmurs, rubs, or gallops,no edema  Unable to appreciate lower extremity pulses Respiratory:  clear to auscultation bilaterally, normal work of breathing GI: soft, nontender, nondistended, + BS MS: no deformity or atrophy  Skin: warm and dry, no rash Neuro:  Strength and sensation are intact Psych: euthymic mood, full affect   Recent Labs: 09/13/2018: ALT 22; Hemoglobin 15.0; Platelets 166.0; TSH 1.65 02/28/2019: BUN 17; Creatinine, Ser 0.88; Potassium 4.5; Sodium 140   Lipid Panel Lab Results  Component Value Date   CHOL 119 09/13/2018   HDL 25.20 (L) 09/13/2018   LDLCALC 62 06/07/2017   TRIG 284.0 (H) 09/13/2018     Wt Readings from Last 3 Encounters:  03/18/19 164 lb 12 oz (74.7 kg)  02/28/19 167 lb (75.8 kg)  10/26/18 164 lb 8 oz (74.6 kg)    ASSESSMENT AND PLAN:  Persistent atrial fibrillation ([place]) Managed  by Dr. [name], Sinus brady, will try to change eye drops On Eliquis 5 twice daily If he continues to run low we will discuss with Dr. 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 EKG 12-Lead    Total encounter time more than 25 minutes  Greater than 50% was spent in counseling and coordination of care with the patient   Signed, Esmond Plants, M.D., Ph.D. 03/18/2019  [place], [place]

## 2019-03-18 ENCOUNTER — Ambulatory Visit (INDEPENDENT_AMBULATORY_CARE_PROVIDER_SITE_OTHER): Payer: PPO | Admitting: Cardiovascular Disease

## 2019-03-18 ENCOUNTER — Other Ambulatory Visit: Payer: Self-pay

## 2019-03-18 VITALS — Ht 67.0 in | Wt 164.8 lb

## 2019-03-18 DIAGNOSIS — I6529 Occlusion and stenosis of unspecified carotid artery: Secondary | ICD-10-CM

## 2019-03-18 DIAGNOSIS — I25118 Atherosclerotic heart disease of native coronary artery with other forms of angina pectoris: Secondary | ICD-10-CM | POA: Diagnosis not present

## 2019-03-18 DIAGNOSIS — I4819 Other persistent atrial fibrillation: Secondary | ICD-10-CM | POA: Diagnosis not present

## 2019-03-18 DIAGNOSIS — I63429 Cerebral infarction due to embolism of unspecified anterior cerebral artery: Secondary | ICD-10-CM | POA: Diagnosis not present

## 2019-03-18 DIAGNOSIS — E785 Hyperlipidemia, unspecified: Secondary | ICD-10-CM | POA: Diagnosis not present

## 2019-03-18 DIAGNOSIS — I739 Peripheral vascular disease, unspecified: Secondary | ICD-10-CM

## 2019-03-18 DIAGNOSIS — I10 Essential (primary) hypertension: Secondary | ICD-10-CM | POA: Diagnosis not present

## 2019-03-18 DIAGNOSIS — Z951 Presence of aortocoronary bypass graft: Secondary | ICD-10-CM

## 2019-03-18 NOTE — Patient Instructions (Addendum)
Please monitor blood pressure at home Talk with eye doc about the timolol eye drops and slow heart beat  Medication Instructions:  No changes  If you need a refill on your cardiac medications before your next appointment, please call your pharmacy.    Lab work: No new labs needed   If you have labs (blood work) drawn today and your tests are completely normal, you will receive your results only by: Marland Kitchen MyChart Message (if you have MyChart) OR . A paper copy in the mail If you have any lab test that is abnormal or we need to change your treatment, we will call you to review the results.   Testing/Procedures: No new testing needed   Follow-Up: At [facility], you and your health needs are our priority.  As part of our continuing mission to provide you with exceptional heart care, we have created designated Provider Care Teams.  These Care Teams include your primary Cardiologist (physician) and Advanced Practice Providers (APPs -  Physician Assistants and Nurse Practitioners) who all work together to provide you with the care you need, when you need it.  . You will need a follow up appointment in 12 months .   Please call our office 2 months in advance to schedule this appointment.    . Providers on your designated Care Team:   . Murray Hodgkins, NP . Christell Faith, PA-C . Marrianne Mood, PA-C  Any Other Special Instructions Will Be Listed Below (If Applicable).  For educational health videos Log in to : [email or web address] Or : [email or web address], password : [email or web address]   How to Take Your Blood Pressure You can take your blood pressure at home with a machine. You may need to check your blood pressure at home:  To check if you have high blood pressure (hypertension).  To check your blood pressure over time.  To make sure your blood pressure medicine is working. Supplies needed: You will need a blood pressure machine, or monitor. You can buy one at a drugstore or online. When  choosing one:  Choose one with an arm cuff.  Choose one that wraps around your upper arm. Only one finger should fit between your arm and the cuff.  Do not choose one that measures your blood pressure from your wrist or finger. Your doctor can suggest a monitor. How to prepare Avoid these things for 30 minutes before checking your blood pressure:  Drinking caffeine.  Drinking alcohol.  Eating.  Smoking.  Exercising. Five minutes before checking your blood pressure:  Pee.  Sit in a dining chair. Avoid sitting in a soft couch or armchair.  Be quiet. Do not talk. How to take your blood pressure Follow the instructions that came with your machine. If you have a digital blood pressure monitor, these may be the instructions: 1. Sit up straight. 2. Place your feet on the floor. Do not cross your ankles or legs. 3. Rest your left arm at the level of your heart. You may rest it on a table, desk, or chair. 4. Pull up your shirt sleeve. 5. Wrap the blood pressure cuff around the upper part of your left arm. The cuff should be 1 inch (2.5 cm) above your elbow. It is best to wrap the cuff around bare skin. 6. Fit the cuff snugly around your arm. You should be able to place only one finger between the cuff and your arm. 7. Put the cord inside the groove of your elbow. 8. Press the power button.  9. Sit quietly while the cuff fills with air and loses air. 10. Write down the numbers on the screen. 11. Wait 2-3 minutes and then repeat steps 1-10. What do the numbers mean? Two numbers make up your blood pressure. The first number is called systolic pressure. The second is called diastolic pressure. An example of a blood pressure reading is "120 over 80" (or 120/80). If you are an adult and do not have a medical condition, use this guide to find out if your blood pressure is normal: Normal  First number: below 120.  Second number: below 80. Elevated  First number: 120-129.  Second  number: below 80. Hypertension stage 1  First number: 130-139.  Second number: 80-89. Hypertension stage 2  First number: 140 or above.  Second number: 68 or above. Your blood pressure is above normal even if only the top or bottom number is above normal. Follow these instructions at home:  Check your blood pressure as often as your doctor tells you to.  Take your monitor to your next doctor's appointment. Your doctor will: ? Make sure you are using it correctly. ? Make sure it is working right.  Make sure you understand what your blood pressure numbers should be.  Tell your doctor if your medicines are causing side effects. Contact a doctor if:  Your blood pressure keeps being high. Get help right away if:  Your first blood pressure number is higher than 180.  Your second blood pressure number is higher than 120. This information is not intended to replace advice given to you by your health care provider. Make sure you discuss any questions you have with your health care provider. Document Released: 07/07/2008 Document Revised: 07/07/2017 Document Reviewed: 01/01/2016 Elsevier Patient Education  2020 [employer].  Blood Pressure Record Sheet To take your blood pressure, you will need a blood pressure machine. You can buy a blood pressure machine (blood pressure monitor) at your clinic, drug store, or online. When choosing one, consider:  An automatic monitor that has an arm cuff.  A cuff that wraps snugly around your upper arm. You should be able to fit only one finger between your arm and the cuff.  A device that stores blood pressure reading results.  Do not choose a monitor that measures your blood pressure from your wrist or finger. Follow your health care provider's instructions for how to take your blood pressure. To use this form:  Get one reading in the morning (a.m.) before you take any medicines.  Get one reading in the evening (p.m.) before  supper.  Take at least 2 readings with each blood pressure check. This makes sure the results are correct. Wait 1-2 minutes between measurements.  Write down the results in the spaces on this form.  Repeat this once a week, or as told by your health care provider.  Make a follow-up appointment with your health care provider to discuss the results. Blood pressure log Date: _______________________  a.m. _____________________(1st reading) _____________________(2nd reading)  p.m. _____________________(1st reading) _____________________(2nd reading) Date: _______________________  a.m. _____________________(1st reading) _____________________(2nd reading)  p.m. _____________________(1st reading) _____________________(2nd reading) Date: _______________________  a.m. _____________________(1st reading) _____________________(2nd reading)  p.m. _____________________(1st reading) _____________________(2nd reading) Date: _______________________  a.m. _____________________(1st reading) _____________________(2nd reading)  p.m. _____________________(1st reading) _____________________(2nd reading) Date: _______________________  a.m. _____________________(1st reading) _____________________(2nd reading)  p.m. _____________________(1st reading) _____________________(2nd reading) This information is not intended to replace advice given to you by your health care provider. Make sure you discuss any questions  you have with your health care provider. Document Released: 04/23/2003 Document Revised: 09/22/2017 Document Reviewed: 07/25/2017 Elsevier Patient Education  2020 [employer].

## 2019-03-21 DIAGNOSIS — H401134 Primary open-angle glaucoma, bilateral, indeterminate stage: Secondary | ICD-10-CM | POA: Diagnosis not present

## 2019-03-21 DIAGNOSIS — H2512 Age-related nuclear cataract, left eye: Secondary | ICD-10-CM | POA: Diagnosis not present

## 2019-03-21 DIAGNOSIS — H25012 Cortical age-related cataract, left eye: Secondary | ICD-10-CM | POA: Diagnosis not present

## 2019-03-21 LAB — HM DIABETES EYE EXAM

## 2019-03-25 ENCOUNTER — Other Ambulatory Visit: Payer: Self-pay

## 2019-03-25 MED ORDER — METOPROLOL TARTRATE 25 MG PO TABS: 12.5000 mg | ORAL_TABLET | Freq: Two times a day (BID) | ORAL | 3 refills | Status: DC

## 2019-03-26 ENCOUNTER — Ambulatory Visit (INDEPENDENT_AMBULATORY_CARE_PROVIDER_SITE_OTHER): Payer: PPO | Admitting: Internal Medicine

## 2019-03-26 ENCOUNTER — Other Ambulatory Visit: Payer: Self-pay

## 2019-03-26 VITALS — BP 150/86 | HR 65 | Temp 98.9°F | Wt 164.0 lb

## 2019-03-26 DIAGNOSIS — M7989 Other specified soft tissue disorders: Secondary | ICD-10-CM

## 2019-03-26 DIAGNOSIS — T63421A Toxic effect of venom of ants, accidental (unintentional), initial encounter: Secondary | ICD-10-CM | POA: Diagnosis not present

## 2019-03-26 MED ORDER — CLOBETASOL PROPIONATE 0.05 % EX CREA: 1.0000 "application " | TOPICAL_CREAM | Freq: Two times a day (BID) | CUTANEOUS | 0 refills | Status: DC

## 2019-03-26 MED ORDER — METHYLPREDNISOLONE ACETATE 80 MG/ML IJ SUSP
80.0000 mg | Freq: Once | INTRAMUSCULAR | Status: AC
  Administered 2019-03-26: 12:00:00 80 mg via INTRAMUSCULAR

## 2019-03-26 NOTE — Progress Notes (Signed)
Subjective:    Patient ID: [name] [name], male    DOB: 11-19-1940, 78 y.o.   MRN: [id number]  HPI  Pt presents to the clinic today with c/o left hand swelling and redness due to fire ant bites. He reports this occurred yesterday while working out in the yard. The bites itch and burn. He has pain in the left hand which he describes as more of a stiffness due to the swelling. He has tried Hydrocortisone cream without any relief.  Review of Systems      Past Medical History:  Diagnosis Date  . Anxiety   . Back pain   . Coronary artery disease 08/21/2013   a. 08/2013 s/p CABG x 6.  . Diabetes mellitus   . Diabetic neuropathy ([place])   . Diastolic dysfunction    a. 02/2017 Echo: EF 55-60%, no rwma, Gr1 DD, mildly dil Ao Root/RV. + PFO.  . Diverticulosis   . Diverticulosis   . Embolic stroke ([place])    a. 04/2017 MRI in setting of freq falls: small subacute infarction of bilat centrum semi-ovale consistent w/ embolic event-->Eliquis.  . Erectile dysfunction   . Family history of anesthesia complication    ' they cant wake my brother very easy"  . Fatty liver   . Flank pain   . GERD (gastroesophageal reflux disease)   . History of bronchitis   . History of chicken pox   . 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 Hypercholesterolemia   . Hypertension   . Kidney stones   . Osteoarthritis   . Persistent atrial fibrillation    a. Had post-op AF 08/2013; b. 09/2015 Holter: PAC's no AF; c. 05/2017 Noted to be in AFib-->Eliquis (CHA2DS2VASc = 7); d. 08/2017 s/p DCCV.  Marland Kitchen PFO (patent foramen ovale)    a. 02/2017 Echo: + PFO.    Current Outpatient Medications  Medication Sig Dispense Refill  . acyclovir (ZOVIRAX) 800 MG tablet Take 1 tablet (800 mg total) by mouth 2 (two) times daily. 180 tablet 3  . amLODipine (NORVASC) 5 MG tablet Take 1 tablet (5 mg total) by mouth daily. 90 tablet 1  . apixaban (ELIQUIS) 5 MG TABS tablet Take 1 tablet (5 mg total) by mouth 2 (two) times daily.  180 tablet 3  . COD LIVER OIL PO Take 1 capsule by mouth 2 (two) times daily.    Marland Kitchen dofetilide (TIKOSYN) 500 MCG capsule Take 1 capsule (500 mcg total) by mouth 2 (two) times daily. 180 capsule 2  . ezetimibe (ZETIA) 10 MG tablet TAKE 1 TABLET BY MOUTH EVERY DAY 90 tablet 0  . gabapentin (NEURONTIN) 600 MG tablet Take 1 tablet (600 mg total) by mouth 3 (three) times daily as needed.    . loratadine (CLARITIN) 10 MG tablet Take 1 tablet (10 mg total) by mouth daily.    Marland Kitchen losartan (COZAAR) 100 MG tablet Take 1 tablet (100 mg total) by mouth daily. 90 tablet 3  . metFORMIN (GLUCOPHAGE) 1000 MG tablet Take 1 tablet (1,000 mg total) by mouth 2 (two) times daily with a meal. 180 tablet 3  . metoprolol tartrate (LOPRESSOR) 25 MG tablet Take 0.5 tablets (12.5 mg total) by mouth 2 (two) times daily. 90 tablet 3  . nitroGLYCERIN (NITROSTAT) 0.4 MG SL tablet Place 1 tablet (0.4 mg total) under the tongue every 5 (five) minutes as needed for chest pain (max 3 doses). 25 tablet 6  . timolol (BETIMOL) 0.5 % ophthalmic  solution Place 1 drop into both eyes 2 (two) times daily.    . clobetasol cream (TEMOVATE) 2.58 % Apply 1 application topically 2 (two) times daily. 30 g 0   No current facility-administered medications for this visit.     Allergies  Allergen Reactions  . Fenofibrate Other (See Comments)    Muscle weakness/pain  . Statins Other (See Comments)    MYALGIA, muscle pain    Family History  Problem Relation Age of Onset  . Stroke Mother   . Diabetes Mother   . Heart disease Father        heart failure  . Colon cancer Maternal Grandmother   . Parkinsonism Maternal Grandfather   . Stroke Paternal Grandfather   . Prostate cancer Maternal Uncle   . CAD Brother        CABG    Social History   Socioeconomic History  . Marital status: Married    Spouse name: Not on file  . Number of children: 2  . Years of education: HS  . Highest education level: Not on file  Occupational History  .  Occupation: Retired  Scientific laboratory technician  . Financial resource strain: Not on file  . Food insecurity    Worry: Not on file    Inability: Not on file  . Transportation needs    Medical: Not on file    Non-medical: Not on file  Tobacco Use  . Smoking status: Never Smoker  . Smokeless tobacco: Never Used  Substance and Sexual Activity  . Alcohol use: No    Alcohol/week: 0.0 standard drinks  . Drug use: No  . Sexual activity: Not on file  Lifestyle  . Physical activity    Days per week: Not on file    Minutes per session: Not on file  . Stress: Not on file  Relationships  . Social Herbalist on phone: Not on file    Gets together: Not on file    Attends religious service: Not on file    Active member of club or organization: Not on file    Attends meetings of clubs or organizations: Not on file    Relationship status: Not on file  . Intimate partner violence    Fear of current or ex partner: Not on file    Emotionally abused: Not on file    Physically abused: Not on file    Forced sexual activity: Not on file  Other Topics Concern  . 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   Right-handed.   2 cups caffeine daily     Constitutional: Denies fever, malaise, fatigue, headache or abrupt weight changes.  Musculoskeletal: Pt reports left hand swelling and pain. Denies decrease in range of motion, difficulty with gait, muscle pain.  Skin: Pt reports fire ant bites to left hand. Denies ulcercations.    No other specific complaints in a complete review of systems (except as listed in HPI above).  Objective:   Physical Exam   BP (!) 150/86   Pulse 65   Temp 98.9 F (37.2 C) (Temporal)   Wt 164 lb (74.4 kg)   SpO2 98%   BMI 25.69 kg/m  Wt Readings from Last 3 Encounters:  03/26/19 164 lb (74.4 kg)  03/18/19 164 lb 12 oz (74.7  kg)  02/28/19 167 lb (  75.8 kg)    General: Appears his stated age, well developed, well nourished in NAD. Skin: Scattered pustules on erythematous base noted of left hand. Cardiovascular: Radial pulses 2+ bilaterally. Musculoskeletal: 1+ swelling of left hand. Hand grips equal. Neurological: Alert and oriented. Sensation intact to BUE.   BMET    Component Value Date/Time   NA 140 02/28/2019 1025   K 4.5 02/28/2019 1025   CL 103 02/28/2019 1025   CO2 28 02/28/2019 1025   GLUCOSE 148 (H) 02/28/2019 1025   BUN 17 02/28/2019 1025   CREATININE 0.88 02/28/2019 1025   CALCIUM 9.8 02/28/2019 1025   GFRNONAA >60 09/28/2017 1000   GFRAA >60 09/28/2017 1000    Lipid Panel     Component Value Date/Time   CHOL 119 09/13/2018 0811   TRIG 284.0 (H) 09/13/2018 0811   HDL 25.20 (L) 09/13/2018 0811   CHOLHDL 5 09/13/2018 0811   VLDL 56.8 (H) 09/13/2018 0811   LDLCALC 62 06/07/2017 0738    CBC    Component Value Date/Time   WBC 6.2 09/13/2018 0811   RBC 4.61 09/13/2018 0811   HGB 15.0 09/13/2018 0811   HCT 43.3 09/13/2018 0811   PLT 166.0 09/13/2018 0811   MCV 94.0 09/13/2018 0811   MCH 31.5 01/19/2017 1931   MCHC 34.6 09/13/2018 0811   RDW 14.0 09/13/2018 0811   LYMPHSABS 2.2 09/13/2018 0811   MONOABS 0.5 09/13/2018 0811   EOSABS 0.1 09/13/2018 0811   BASOSABS 0.0 09/13/2018 0811    Hgb A1C Lab Results  Component Value Date   HGBA1C 7.0 (H) 02/28/2019           Assessment & Plan:   Left Hand Swelling, Fire Ant Bites:  80 mg Depo Medrol IM RX for Clobetasol cream 0.05 % BID prn- encouraged him to wash his hands after use  Return precautions discussed Webb Silversmith, NP

## 2019-03-26 NOTE — Patient Instructions (Signed)

## 2019-03-28 ENCOUNTER — Encounter: Payer: Self-pay | Admitting: Internal Medicine

## 2019-03-28 DIAGNOSIS — B078 Other viral warts: Secondary | ICD-10-CM | POA: Diagnosis not present

## 2019-03-28 DIAGNOSIS — L820 Inflamed seborrheic keratosis: Secondary | ICD-10-CM | POA: Diagnosis not present

## 2019-03-28 DIAGNOSIS — L821 Other seborrheic keratosis: Secondary | ICD-10-CM | POA: Diagnosis not present

## 2019-04-08 ENCOUNTER — Other Ambulatory Visit: Payer: Self-pay

## 2019-04-08 ENCOUNTER — Encounter: Payer: Self-pay | Admitting: Family Medicine

## 2019-04-08 ENCOUNTER — Ambulatory Visit (INDEPENDENT_AMBULATORY_CARE_PROVIDER_SITE_OTHER): Payer: PPO | Admitting: Family Medicine

## 2019-04-08 VITALS — BP 140/60 | HR 56 | Temp 98.5°F | Ht 67.0 in | Wt 162.3 lb

## 2019-04-08 DIAGNOSIS — R1030 Lower abdominal pain, unspecified: Secondary | ICD-10-CM | POA: Diagnosis not present

## 2019-04-08 DIAGNOSIS — R1084 Generalized abdominal pain: Secondary | ICD-10-CM | POA: Diagnosis not present

## 2019-04-08 LAB — POC URINALSYSI DIPSTICK (AUTOMATED)
Bilirubin, UA: NEGATIVE
Glucose, UA: NEGATIVE
Ketones, UA: NEGATIVE
Leukocytes, UA: NEGATIVE
Nitrite, UA: NEGATIVE
Protein, UA: POSITIVE — AB
Spec Grav, UA: >=1.03 — AB (ref 1.010–1.025)
Urobilinogen, UA: 0.2 E.U./dL
pH, UA: 5 (ref 5.0–8.0)

## 2019-04-08 LAB — URINALYSIS, MICROSCOPIC ONLY

## 2019-04-08 MED ORDER — CEPHALEXIN 500 MG PO CAPS: 500.0000 mg | ORAL_CAPSULE | Freq: Two times a day (BID) | ORAL | 0 refills | Status: DC

## 2019-04-08 NOTE — Patient Instructions (Signed)
If your abdominal pain gets worse or if you have any pain on urination, then start the antibiotics.   We'll contact you with your lab report. Take care.  Glad to see you.

## 2019-04-08 NOTE — Progress Notes (Signed)
Abd pain going on "for a long time".  Worse over the most recent weekend, sleep was disrupted.  Dec in appetite.  Radiated to his lower back.  Lower anterior B abd pain.  No burning with urination.  He had the feeling of needing to have a BM.  No dysuria.  No fevers.  Loose BMs.  No blood in stool.  No black stools.  Still on eliquis.  Less pain now, just "a little bit uncomfortable."  H/o hernia repair in the past.  H/o cholecystectomy.    Meds, vitals, and allergies reviewed.   ROS: Per HPI unless specifically indicated in ROS section   nad ncat Neck supple, no LA rrr ctab abd soft, B lower abd minimally ttp w/o rebound.  Normal BS No cva pain.  Ext w/o edema.

## 2019-04-10 LAB — URINE CULTURE
MICRO NUMBER:: 829524
Result:: NO GROWTH
SPECIMEN QUALITY:: ADEQUATE

## 2019-04-11 ENCOUNTER — Other Ambulatory Visit: Payer: Self-pay | Admitting: Internal Medicine

## 2019-04-11 ENCOUNTER — Other Ambulatory Visit: Payer: Self-pay | Admitting: Cardiovascular Disease

## 2019-04-11 NOTE — Assessment & Plan Note (Signed)
Abnormal urinalysis with follow-up microscopy and urine culture pending.  Not clear that he has a UTI, discussed with patient about options.  Hold Keflex for now.  If he has progression of dysuria over the next few days then start taking Keflex.  Update me as needed.  See notes on follow-up labs.  At this point still okay for outpatient follow-up.  He agrees with plan.

## 2019-04-12 ENCOUNTER — Other Ambulatory Visit: Payer: Self-pay | Admitting: Family Medicine

## 2019-04-12 ENCOUNTER — Other Ambulatory Visit: Payer: Self-pay

## 2019-04-12 ENCOUNTER — Telehealth: Payer: Self-pay | Admitting: Family Medicine

## 2019-04-12 ENCOUNTER — Ambulatory Visit
Admission: RE | Admit: 2019-04-12 | Discharge: 2019-04-12 | Disposition: A | Discharge status: Home / Self Care | Payer: PPO | Source: Ambulatory Visit | Attending: Family Medicine | Admitting: Family Medicine

## 2019-04-12 DIAGNOSIS — R1030 Lower abdominal pain, unspecified: Secondary | ICD-10-CM | POA: Insufficient documentation

## 2019-04-12 DIAGNOSIS — K5730 Diverticulosis of large intestine without perforation or abscess without bleeding: Secondary | ICD-10-CM | POA: Diagnosis not present

## 2019-04-12 MED ORDER — AMOXICILLIN-POT CLAVULANATE 875-125 MG PO TABS: 1.0000 | ORAL_TABLET | Freq: Two times a day (BID) | ORAL | 0 refills | Status: DC

## 2019-04-12 NOTE — Telephone Encounter (Signed)
Best number [phone] [name] (Spouse) called stating pt would like to have CT scan that dr Damita Dunnings recommend.  She stated pt had bad night last night.   Lot of pain in stomach.

## 2019-04-12 NOTE — Telephone Encounter (Signed)
I spoke with [name]. [name] [name]; Dr Damita Dunnings wants pt to go to [place] entrance for CT scan now. Pt notified and voiced understanding and will go now to get CT scan done and [name] [name] was very appreciative.

## 2019-04-12 NOTE — Telephone Encounter (Signed)
Please triage this patient and update me about his situation.  Thanks .

## 2019-04-12 NOTE — Telephone Encounter (Signed)
I spoke with [name] [name](DPR signed)last night [name] [name] had lower abd pain and [name] [name] is not sure where the abd pain was located. [name] [name] found [name] [name] and [name] [name] said the dull abd pain last night was all the way across the lower abd and was a continuous pain; Today the lower abd pain is not so bad unless the [name] [name] pushes on lower abd and then pain level is 5-6. No N&V but [name] [name] has had diarrhea and in last 24 hrs had 6 diarrhea stools that have gone from watery BM to small amt of form to BM but still loose BMs. No blood in urine or in BM. No fever. [name] [name] said after thinking about getting a CT scan he decided he needs to find out what is causing this pain. ED precautions given and [name] [name] voiced understanding. [name] [name] will wait to hear back from [facility].

## 2019-04-14 NOTE — Telephone Encounter (Signed)
See result note from CT.  Thanks.

## 2019-04-15 ENCOUNTER — Telehealth: Payer: Self-pay | Admitting: Family Medicine

## 2019-04-15 NOTE — Telephone Encounter (Signed)
Please get update on patient regarding abdominal pain.  Thanks.

## 2019-04-16 NOTE — Telephone Encounter (Signed)
Patient says the pain is 99% better, still a little stiff when he tries to get up but once he's up, he feels great.

## 2019-04-16 NOTE — Telephone Encounter (Signed)
Great, would finish the abx.  Update me if sx persist after done with abx.  Thanks.

## 2019-05-10 ENCOUNTER — Encounter: Payer: Self-pay | Admitting: Family Medicine

## 2019-06-07 ENCOUNTER — Other Ambulatory Visit: Payer: Self-pay | Admitting: Internal Medicine

## 2019-06-13 ENCOUNTER — Other Ambulatory Visit: Payer: Self-pay | Admitting: Internal Medicine

## 2019-06-14 ENCOUNTER — Telehealth: Payer: Self-pay | Admitting: Internal Medicine

## 2019-06-14 MED ORDER — DOFETILIDE 500 MCG PO CAPS: 500.0000 ug | ORAL_CAPSULE | Freq: Two times a day (BID) | ORAL | 1 refills | Status: DC

## 2019-06-14 NOTE — Telephone Encounter (Signed)
Left detailed message for pt.  Pt saw Dr. Rockey Situ in August and had EKG.  Will have Dr. [name] review.  Advised Pt needs to follow up with [facility] in February, explained importance of monitoring on dofetilide.    Dofetilide refilled- Will send to scheduling to set up appt in [place].

## 2019-06-14 NOTE — Telephone Encounter (Signed)
New Message  Patient is calling because he is aware that he is past due to see Dr. [name]. His concern is that he is not comfortable coming to the doctor and he does not want his medication refills to stop. Therefore he wants to speak with the nurse to assure that he will continue to get his medication.

## 2019-06-18 NOTE — Telephone Encounter (Signed)
EKG reviewed by [facility].  Per GT please refill dofetilide.

## 2019-07-17 ENCOUNTER — Other Ambulatory Visit: Payer: Self-pay | Admitting: Internal Medicine

## 2019-07-22 ENCOUNTER — Other Ambulatory Visit: Payer: Self-pay | Admitting: Internal Medicine

## 2019-07-22 NOTE — Telephone Encounter (Signed)
Prescription refill request for Eliquis received.  Last office visit: 8/10/2020Rockey Situ Scr:0.88, 02/28/2019 Age: 78 y.o. Weight: 73.6 kg  Prescription refill sent.

## 2019-08-08 ENCOUNTER — Other Ambulatory Visit: Payer: Self-pay | Admitting: Internal Medicine

## 2019-08-16 ENCOUNTER — Telehealth: Payer: Self-pay

## 2019-08-16 NOTE — Telephone Encounter (Signed)
Patient called stating that he is scheduled to get COVID vaccine on 08/19/2019 and wanted to make sure this was ok to do with him taking Eliquis. I spoke with Dr Damita Dunnings and advised patient it is ok to proceed from the information he is aware of.

## 2019-08-16 NOTE — Telephone Encounter (Signed)
Thanks

## 2019-08-18 ENCOUNTER — Other Ambulatory Visit: Payer: Self-pay | Admitting: Family Medicine

## 2019-08-19 NOTE — Telephone Encounter (Signed)
Patient needs AWV visit scheduled when possible in February

## 2019-08-20 NOTE — Telephone Encounter (Signed)
Patient scheduled. Patient request late March appt.

## 2019-09-19 ENCOUNTER — Ambulatory Visit: Payer: PPO | Admitting: Internal Medicine

## 2019-10-09 DIAGNOSIS — B078 Other viral warts: Secondary | ICD-10-CM | POA: Diagnosis not present

## 2019-10-09 DIAGNOSIS — D485 Neoplasm of uncertain behavior of skin: Secondary | ICD-10-CM | POA: Diagnosis not present

## 2019-10-09 DIAGNOSIS — B079 Viral wart, unspecified: Secondary | ICD-10-CM | POA: Diagnosis not present

## 2019-10-09 DIAGNOSIS — L820 Inflamed seborrheic keratosis: Secondary | ICD-10-CM | POA: Diagnosis not present

## 2019-10-09 DIAGNOSIS — L570 Actinic keratosis: Secondary | ICD-10-CM | POA: Diagnosis not present

## 2019-10-15 DIAGNOSIS — H25012 Cortical age-related cataract, left eye: Secondary | ICD-10-CM | POA: Diagnosis not present

## 2019-10-15 DIAGNOSIS — H40021 Open angle with borderline findings, high risk, right eye: Secondary | ICD-10-CM | POA: Diagnosis not present

## 2019-10-15 DIAGNOSIS — H179 Unspecified corneal scar and opacity: Secondary | ICD-10-CM | POA: Diagnosis not present

## 2019-10-18 ENCOUNTER — Other Ambulatory Visit: Payer: Self-pay | Admitting: Internal Medicine

## 2019-10-21 NOTE — Telephone Encounter (Signed)
Prescription refill request for Eliquis received.  Last office visit: [place], 03/18/2019 Scr: 0.88, 02/28/2019 Age: 79 y.o. Weight:73.6 kg   Prescription refill sent.

## 2019-10-28 ENCOUNTER — Other Ambulatory Visit: Payer: Self-pay | Admitting: Family Medicine

## 2019-10-28 DIAGNOSIS — Z125 Encounter for screening for malignant neoplasm of prostate: Secondary | ICD-10-CM

## 2019-10-28 DIAGNOSIS — E1149 Type 2 diabetes mellitus with other diabetic neurological complication: Secondary | ICD-10-CM

## 2019-10-29 ENCOUNTER — Other Ambulatory Visit (INDEPENDENT_AMBULATORY_CARE_PROVIDER_SITE_OTHER): Payer: PPO

## 2019-10-29 ENCOUNTER — Other Ambulatory Visit: Payer: Self-pay

## 2019-10-29 DIAGNOSIS — E1149 Type 2 diabetes mellitus with other diabetic neurological complication: Secondary | ICD-10-CM | POA: Diagnosis not present

## 2019-10-29 DIAGNOSIS — Z125 Encounter for screening for malignant neoplasm of prostate: Secondary | ICD-10-CM | POA: Diagnosis not present

## 2019-10-29 LAB — LIPID PANEL
Cholesterol: 103 mg/dL (ref 0–200)
HDL: 28.7 mg/dL — ABNORMAL LOW (ref >39.00)
NonHDL: 73.94
Total CHOL/HDL Ratio: 4
Triglycerides: 218 mg/dL — ABNORMAL HIGH (ref 0.0–149.0)
VLDL: 43.6 mg/dL — ABNORMAL HIGH (ref 0.0–40.0)

## 2019-10-29 LAB — COMPREHENSIVE METABOLIC PANEL
ALT: 17 U/L (ref 0–53)
AST: 22 U/L (ref 0–37)
Albumin: 4.7 g/dL (ref 3.5–5.2)
Alkaline Phosphatase: 72 U/L (ref 39–117)
BUN: 13 mg/dL (ref 6–23)
CO2: 30 mEq/L (ref 19–32)
Calcium: 10 mg/dL (ref 8.4–10.5)
Chloride: 102 mEq/L (ref 96–112)
Creatinine, Ser: 0.85 mg/dL (ref 0.40–1.50)
GFR: 86.87 mL/min (ref >60.00)
Glucose, Bld: 179 mg/dL — ABNORMAL HIGH (ref 70–99)
Potassium: 4.2 mEq/L (ref 3.5–5.1)
Sodium: 139 mEq/L (ref 135–145)
Total Bilirubin: 1.6 mg/dL — ABNORMAL HIGH (ref 0.2–1.2)
Total Protein: 6.8 g/dL (ref 6.0–8.3)

## 2019-10-29 LAB — CBC WITH DIFFERENTIAL/PLATELET
Basophils Absolute: 0 10*3/uL (ref 0.0–0.1)
Basophils Relative: 0.4 % (ref 0.0–3.0)
Eosinophils Absolute: 0.2 10*3/uL (ref 0.0–0.7)
Eosinophils Relative: 2.9 % (ref 0.0–5.0)
HCT: 45.4 % (ref 39.0–52.0)
Hemoglobin: 15.5 g/dL (ref 13.0–17.0)
Lymphocytes Relative: 44 % (ref 12.0–46.0)
Lymphs Abs: 3.3 10*3/uL (ref 0.7–4.0)
MCHC: 34.1 g/dL (ref 30.0–36.0)
MCV: 95.9 fl (ref 78.0–100.0)
Monocytes Absolute: 0.5 10*3/uL (ref 0.1–1.0)
Monocytes Relative: 6.2 % (ref 3.0–12.0)
Neutro Abs: 3.4 10*3/uL (ref 1.4–7.7)
Neutrophils Relative %: 46.5 % (ref 43.0–77.0)
Platelets: 165 10*3/uL (ref 150.0–400.0)
RBC: 4.74 Mil/uL (ref 4.22–5.81)
RDW: 13.2 % (ref 11.5–15.5)
WBC: 7.4 10*3/uL (ref 4.0–10.5)

## 2019-10-29 LAB — TSH: TSH: 1.8 u[IU]/mL (ref 0.35–4.50)

## 2019-10-29 LAB — HEMOGLOBIN A1C: Hgb A1c MFr Bld: 6.7 % — ABNORMAL HIGH (ref 4.6–6.5)

## 2019-10-29 LAB — PSA, MEDICARE: PSA: 0.2 ng/ml (ref 0.10–4.00)

## 2019-10-29 LAB — LDL CHOLESTEROL, DIRECT: Direct LDL: 51 mg/dL

## 2019-11-05 ENCOUNTER — Encounter: Payer: Self-pay | Admitting: Family Medicine

## 2019-11-05 ENCOUNTER — Other Ambulatory Visit: Payer: Self-pay

## 2019-11-05 ENCOUNTER — Ambulatory Visit (INDEPENDENT_AMBULATORY_CARE_PROVIDER_SITE_OTHER): Payer: PPO | Admitting: Family Medicine

## 2019-11-05 VITALS — BP 144/76 | HR 71 | Temp 96.7°F | Ht 67.0 in | Wt 163.5 lb

## 2019-11-05 DIAGNOSIS — E1149 Type 2 diabetes mellitus with other diabetic neurological complication: Secondary | ICD-10-CM

## 2019-11-05 DIAGNOSIS — Z0000 Encounter for general adult medical examination without abnormal findings: Secondary | ICD-10-CM

## 2019-11-05 DIAGNOSIS — E7800 Pure hypercholesterolemia, unspecified: Secondary | ICD-10-CM

## 2019-11-05 DIAGNOSIS — Z7189 Other specified counseling: Secondary | ICD-10-CM

## 2019-11-05 DIAGNOSIS — I10 Essential (primary) hypertension: Secondary | ICD-10-CM | POA: Diagnosis not present

## 2019-11-05 MED ORDER — METFORMIN HCL 1000 MG PO TABS: 1000.0000 mg | ORAL_TABLET | Freq: Two times a day (BID) | ORAL | 3 refills | Status: DC

## 2019-11-05 MED ORDER — ACYCLOVIR 800 MG PO TABS: 800.0000 mg | ORAL_TABLET | Freq: Two times a day (BID) | ORAL | 3 refills | Status: DC

## 2019-11-05 NOTE — Progress Notes (Signed)
This visit occurred during the SARS-CoV-2 public health emergency.  Safety protocols were in place, including screening questions prior to the visit, additional usage of staff PPE, and extensive cleaning of exam room while observing appropriate contact time as indicated for disinfecting solutions.  I have personally reviewed the Medicare Annual Wellness questionnaire and have noted 1. The patient's medical and social history 2. Their use of alcohol, tobacco or illicit drugs 3. Their current medications and supplements 4. The patient's functional ability including ADL's, fall risks, home safety risks and hearing or visual             impairment. 5. Diet and physical activities 6. Evidence for depression or mood disorders  The patients weight, height, BMI have been recorded in the chart and visual acuity is per eye clinic.  I have made referrals, counseling and provided education to the patient based review of the above and I have provided the pt with a written personalized care plan for preventive services.  Provider list updated- see scanned forms.  Routine anticipatory guidance given to patient.  See health maintenance. The possibility exists that previously documented standard health maintenance information may have been brought forward from a previous encounter into this note.  If needed, that same information has been updated to reflect the current situation based on today's encounter.    Flu d/w pt.   Shingles d/w pt.   PNA UTD Tetanus 2020 covid vaccine 2021 Colonoscopy 2018 PSA 2021 Advance directive- wife and daughter equally designated if patient were incapacitated.   Cognitive function addressed- see scanned forms- and if abnormal then additional documentation follows.   Diabetes:  Using medications without difficulties: still on metformin.   Hypoglycemic episodes: no sx Hyperglycemic episodes: no sx Feet problems: still with neuropathy at baseline, not worse than prev. Some  better with prn gabapentin.  No ADE on med.  Blood Sugars averaging: not checked.  eye exam within last year: yes  Hypertension/AF.    Using medication without problems or lightheadedness: rarely lightheaded on sudden standing.   Chest pain with exertion:no Edema:no Short of breath:no Average home BPs: variable on home checks.  His cuff readings are higher and unclear if his cuff is reading inappropriately high.   No bleeding on eliquis, BB, ARB, CCB.     Elevated Cholesterol: Using medications without problems: yes, still on zetia.  Muscle aches: no Diet compliance: yes Exercise: yes  PMH and SH reviewed  Meds, vitals, and allergies reviewed.   ROS: Per HPI.  Unless specifically indicated otherwise in HPI, the patient denies:  General: fever. Eyes: acute vision changes ENT: sore throat Cardiovascular: chest pain Respiratory: SOB GI: vomiting GU: dysuria Musculoskeletal: acute back pain Derm: acute rash Neuro: acute motor dysfunction Psych: worsening mood Endocrine: polydipsia Heme: bleeding Allergy: hayfever  GEN: nad, alert and oriented HEENT: ncat NECK: supple w/o LA CV: rrr. PULM: ctab, no inc wob ABD: soft, +bs EXT: no edema SKIN: no acute rash  Diabetic foot exam: Normal inspection No skin breakdown No calluses  Normal DP pulses Normal sensation to light touch and monofilament B 1st nails thickened.    Health Maintenance  Topic Date Due  . INFLUENZA VACCINE  03/08/2020  . OPHTHALMOLOGY EXAM  03/20/2020  . HEMOGLOBIN A1C  04/30/2020  . FOOT EXAM  11/04/2020  . TETANUS/TDAP  10/25/2028  . PNA vac Low Risk Adult  Completed

## 2019-11-05 NOTE — Patient Instructions (Addendum)
Check with your insurance to see if they will cover the shingrix shot. Take your cuff to your next appointment and calibrate it.   If you have any low sugars, then cut the metformin in half (1 pill a day) and let me know.  Recheck A1c at a visit in about 6 months.  Update me as needed.  Take care.  Glad to see you.

## 2019-11-08 ENCOUNTER — Ambulatory Visit: Payer: PPO | Admitting: Internal Medicine

## 2019-11-08 ENCOUNTER — Encounter: Payer: Self-pay | Admitting: Internal Medicine

## 2019-11-08 ENCOUNTER — Other Ambulatory Visit: Payer: Self-pay

## 2019-11-08 DIAGNOSIS — I10 Essential (primary) hypertension: Secondary | ICD-10-CM | POA: Diagnosis not present

## 2019-11-08 DIAGNOSIS — I4819 Other persistent atrial fibrillation: Secondary | ICD-10-CM

## 2019-11-08 DIAGNOSIS — Z951 Presence of aortocoronary bypass graft: Secondary | ICD-10-CM | POA: Diagnosis not present

## 2019-11-08 MED ORDER — CARVEDILOL 6.25 MG PO TABS: 6.2500 mg | ORAL_TABLET | Freq: Two times a day (BID) | ORAL | 3 refills | Status: DC

## 2019-11-08 MED ORDER — CARVEDILOL 6.25 MG PO TABS: ORAL_TABLET | ORAL | 3 refills | Status: DC

## 2019-11-08 NOTE — Progress Notes (Signed)
HPI Mr. Winson returns today for followup. He is a pleasant 79 yo man with a h/o PAF who has been controlled with dofetilide. He also has HTN. He has not used salt but admits to eating salty foods. He remains active raising cattle. He has occasional break through palpitations.  Allergies  Allergen Reactions  . Fenofibrate Other (See Comments)    Muscle weakness/pain  . Statins Other (See Comments)    MYALGIA, muscle pain     Current Outpatient Medications  Medication Sig Dispense Refill  . acyclovir (ZOVIRAX) 800 MG tablet Take 1 tablet (800 mg total) by mouth 2 (two) times daily. 180 tablet 3  . amLODipine (NORVASC) 5 MG tablet TAKE 1 TABLET BY MOUTH ONCE DAILY 90 tablet 8  . COD LIVER OIL PO Take 1 capsule by mouth 2 (two) times daily.    Marland Kitchen dofetilide (TIKOSYN) 500 MCG capsule Take 1 capsule (500 mcg total) by mouth 2 (two) times daily. Please make annual appt with Dr. [name] for further refills. [phone]. 1st attempt. 180 capsule 1  . ELIQUIS 5 MG TABS tablet TAKE 1 TABLET BY MOUTH TWICE DAILY 180 tablet 1  . ezetimibe (ZETIA) 10 MG tablet TAKE 1 TABLET BY MOUTH EVERY DAY 90 tablet 3  . gabapentin (NEURONTIN) 600 MG tablet Take 1 tablet (600 mg total) by mouth 3 (three) times daily as needed.    . loratadine (CLARITIN) 10 MG tablet Take 1 tablet (10 mg total) by mouth daily.    Marland Kitchen losartan (COZAAR) 100 MG tablet TAKE 1 TABLET (100 MG TOTAL) BY MOUTH DAILY. PLEASE KEEP UPCOMING APPT FOR FUTURE REFILLS. THANK YOU 90 tablet 2  . metFORMIN (GLUCOPHAGE) 1000 MG tablet Take 1 tablet (1,000 mg total) by mouth 2 (two) times daily with a meal. 180 tablet 3  . nitroGLYCERIN (NITROSTAT) 0.4 MG SL tablet Place 1 tablet (0.4 mg total) under the tongue every 5 (five) minutes as needed for chest pain (max 3 doses). 25 tablet 6  . timolol (BETIMOL) 0.5 % ophthalmic solution Place 1 drop into both eyes 2 (two) times daily.    . valACYclovir (VALTREX) 1000 MG tablet Take 1,000 mg by mouth in the  morning and at bedtime.     No current facility-administered medications for this visit.     Past Medical History:  Diagnosis Date  . Anxiety   . Back pain   . Coronary artery disease 08/21/2013   a. 08/2013 s/p CABG x 6.  . Diabetes mellitus   . Diabetic neuropathy ([place])   . Diastolic dysfunction    a. 02/2017 Echo: EF 55-60%, no rwma, Gr1 DD, mildly dil Ao Root/RV. + PFO.  . Diverticulosis   . Diverticulosis   . Embolic stroke ([place])    a. 04/2017 MRI in setting of freq falls: small subacute infarction of bilat centrum semi-ovale consistent w/ embolic event-->Eliquis.  . Erectile dysfunction   . Family history of anesthesia complication    ' they cant wake my brother very easy"  . Fatty liver   . Flank pain   . GERD (gastroesophageal reflux disease)   . History of bronchitis   . History of chicken pox   . 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 Hypercholesterolemia   . Hypertension   . Kidney stones   . Osteoarthritis   . Persistent atrial fibrillation ([place])    a. Had post-op AF 08/2013; b. 09/2015 Holter: PAC's no AF; c. 05/2017  Noted to be in AFib-->Eliquis (CHA2DS2VASc = 7); d. 08/2017 s/p DCCV.  Marland Kitchen PFO (patent foramen ovale)    a. 02/2017 Echo: + PFO.    ROS:   All systems reviewed and negative except as noted in the HPI.   Past Surgical History:  Procedure Laterality Date  . CARDIAC CATHETERIZATION  08/21/2013   DR Angelena Form  . CARDIOVERSION N/A 08/24/2017   Procedure: CARDIOVERSION;  Surgeon: Minna Merritts, MD;  Location: [facility];  Service: Cardiovascular;  Laterality: N/A;  . CHOLECYSTECTOMY  1983  . CORONARY ARTERY BYPASS GRAFT N/A 08/23/2013   Procedure: CORONARY ARTERY BYPASS GRAFTING (CABG);  Surgeon: Grace Isaac, MD;  Location: [place];  Service: Open Heart Surgery;  Laterality: N/A;  CABG times six utilizing the left internal mammary artery and the right greater saphenous vein harvested endoscopically  . ENDOVEIN HARVEST OF GREATER  SAPHENOUS VEIN Right 08/23/2013   Procedure: ENDOVEIN HARVEST OF GREATER SAPHENOUS VEIN;  Surgeon: Grace Isaac, MD;  Location: [place];  Service: Open Heart Surgery;  Laterality: Right;  . HERNIA REPAIR     X3  . INTRAOPERATIVE TRANSESOPHAGEAL ECHOCARDIOGRAM N/A 08/23/2013   Procedure: INTRAOPERATIVE TRANSESOPHAGEAL ECHOCARDIOGRAM;  Surgeon: Grace Isaac, MD;  Location: [place];  Service: Open Heart Surgery;  Laterality: N/A;  . LEFT HEART CATHETERIZATION WITH CORONARY ANGIOGRAM N/A 08/21/2013   Procedure: LEFT HEART CATHETERIZATION WITH CORONARY ANGIOGRAM;  Surgeon: Burnell Blanks, MD;  Location: [facility] C[facility];  Service: Cardiovascular;  Laterality: N/A;     Family History  Problem Relation Age of Onset  . Stroke Mother   . Diabetes Mother   . Heart disease Father        heart failure  . Colon cancer Maternal Grandmother   . Parkinsonism Maternal Grandfather   . Stroke Paternal Grandfather   . Prostate cancer Maternal Uncle   . CAD Brother        CABG     Social History   Socioeconomic History  . Marital status: Married    Spouse name: Not on file  . Number of children: 2  . Years of education: HS  . Highest education level: Not on file  Occupational History  . Occupation: Retired  Tobacco Use  . Smoking status: Never Smoker  . Smokeless tobacco: Never Used  Substance and Sexual Activity  . Alcohol use: No    Alcohol/week: 0.0 standard drinks  . Drug use: No  . Sexual activity: Not on file  Other Topics Concern  . 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   Right-handed.   2 cups caffeine daily   Social Determinants of Health   Financial Resource Strain:   . Difficulty of Paying Living Expenses:   Food Insecurity:   . Worried About Charity fundraiser in the Last Year:   . Arboriculturist in the Last Year:     Transportation Needs:   . Film/video editor (Medical):   Marland Kitchen Lack of Transportation (Non-Medical):   Physical Activity:   . Days of Exercise per Week:   . Minutes of Exercise per Session:   Stress:   . Feeling of Stress :   Social Connections:   . Frequency of Communication with Friends and Family:   . Frequency of Social Gatherings with Friends and Family:   .  Attends Religious Services:   . Active Member of Clubs or Organizations:   . Attends Archivist Meetings:   Marland Kitchen Marital Status:   Intimate Partner Violence:   . Fear of Current or Ex-Partner:   . Emotionally Abused:   Marland Kitchen Physically Abused:   . Sexually Abused:      BP - 162/70, P - 58, R - 16  Physical Exam:  Well appearing NAD HEENT: Unremarkable Neck:  No JVD, no thyromegally Lymphatics:  No adenopathy Back:  No CVA tenderness Lungs:  Clear with no wheezes HEART:  Regular rate rhythm, no murmurs, no rubs, no clicks Abd:  soft, positive bowel sounds, no organomegally, no rebound, no guarding Ext:  2 plus pulses, no edema, no cyanosis, no clubbing Skin:  No rashes no nodules Neuro:  CN II through XII intact, motor grossly intact  EKG - Sinus bradycardia   Assess/Plan: 1. PAF - He is maintaining NSR. He will continue dofetilide 2. HTN - I have reviewed his bp's and they are all high. I have recommended he switch from metoprolol to coreg 3. CAD - he is s/p CABG. He remains active and has no chest pain. 4. Dyslipidemia - he will continue his Zetia.  Mikle Bosworth.D.

## 2019-11-08 NOTE — Patient Instructions (Addendum)
Medication Instructions:  Your physician has recommended you make the following change in your medication:   1.  Stop taking metoprolol tartrate  2.  Start taking carvedilol 6.25 mg-  For the first 2 weeks take a 1/2 tab by mouth twice a day.  After 2 weeks you will increase to 1 tab by mouth twice a day   Labwork: None ordered.  Testing/Procedures: None ordered.  Follow-Up: Your physician wants you to follow-up in: one year with Dr. [name].   You will receive a reminder letter in the mail two months in advance. If you don't receive a letter, please call our office to schedule the follow-up appointment.   Any Other Special Instructions Will Be Listed Below (If Applicable).  If you need a refill on your cardiac medications before your next appointment, please call your pharmacy.

## 2019-11-08 NOTE — Assessment & Plan Note (Signed)
No change in meds. I don't want to induce hypotension. Continue as is.  He agrees.

## 2019-11-08 NOTE — Assessment & Plan Note (Signed)
Flu d/w pt.   Shingles d/w pt.   PNA UTD Tetanus 2020 covid vaccine 2021 Colonoscopy 2018 PSA 2021 Advance directive- wife and daughter equally designated if patient were incapacitated.   Cognitive function addressed- see scanned forms- and if abnormal then additional documentation follows.

## 2019-11-08 NOTE — Assessment & Plan Note (Signed)
LDL at goal, continue as is.  No change in meds.  See avs.

## 2019-11-08 NOTE — Assessment & Plan Note (Signed)
Advance directive-wife and daughter equally designated if patient were incapacitated. 

## 2019-11-08 NOTE — Assessment & Plan Note (Signed)
A1c at goal, continue as is.  No change in meds.  See avs.

## 2019-11-26 ENCOUNTER — Encounter: Payer: Self-pay | Admitting: Family Medicine

## 2019-11-26 ENCOUNTER — Other Ambulatory Visit: Payer: Self-pay

## 2019-11-26 ENCOUNTER — Ambulatory Visit (INDEPENDENT_AMBULATORY_CARE_PROVIDER_SITE_OTHER): Payer: PPO | Admitting: Family Medicine

## 2019-11-26 DIAGNOSIS — Z91038 Other insect allergy status: Secondary | ICD-10-CM | POA: Diagnosis not present

## 2019-11-26 MED ORDER — CARVEDILOL 6.25 MG PO TABS: ORAL_TABLET | ORAL | Status: DC

## 2019-11-26 MED ORDER — CLOBETASOL PROPIONATE 0.05 % EX CREA: 1.0000 "application " | TOPICAL_CREAM | Freq: Two times a day (BID) | CUTANEOUS | 1 refills | Status: DC | PRN

## 2019-11-26 NOTE — Patient Instructions (Signed)
Use the cream as needed and update me as needed.  Take care.  Glad to see you.

## 2019-11-26 NOTE — Progress Notes (Signed)
This visit occurred during the SARS-CoV-2 public health emergency.  Safety protocols were in place, including screening questions prior to the visit, additional usage of staff PPE, and extensive cleaning of exam room while observing appropriate contact time as indicated for disinfecting solutions.  Bitten by black ants on the hands, once on each hand.  Hand swelling locally.  This was 11/23/19.  Clobetasol cream helped.  No airway sx.  He still has some local sensation of irritation but is clearly better otherwise.  Meds, vitals, and allergies reviewed.   ROS: Per HPI unless specifically indicated in ROS section   nad ncat No acute changes noted on the hand, no ulceration, no spreading erythema.  Skin still well perfused. Speech normal.  No wheeze.  No stridor.

## 2019-11-27 DIAGNOSIS — Z91038 Other insect allergy status: Secondary | ICD-10-CM | POA: Insufficient documentation

## 2019-11-27 NOTE — Assessment & Plan Note (Signed)
Discussed options.  Discussed avoidance.  He has no significant symptoms at this point but that is likely due to clobetasol cream use.  Prescription sent for him to have on hand.  Routine steroid cautions given.  He does not have systemic symptoms.  He will update me as needed.

## 2019-12-15 ENCOUNTER — Other Ambulatory Visit: Payer: Self-pay | Admitting: Internal Medicine

## 2019-12-27 ENCOUNTER — Ambulatory Visit (INDEPENDENT_AMBULATORY_CARE_PROVIDER_SITE_OTHER): Payer: PPO | Admitting: Family Medicine

## 2019-12-27 ENCOUNTER — Other Ambulatory Visit: Payer: Self-pay

## 2019-12-27 ENCOUNTER — Encounter: Payer: Self-pay | Admitting: Family Medicine

## 2019-12-27 VITALS — BP 142/66 | HR 51 | Temp 97.4°F | Ht 67.0 in | Wt 162.2 lb

## 2019-12-27 DIAGNOSIS — L989 Disorder of the skin and subcutaneous tissue, unspecified: Secondary | ICD-10-CM

## 2019-12-27 DIAGNOSIS — S61419A Laceration without foreign body of unspecified hand, initial encounter: Secondary | ICD-10-CM | POA: Diagnosis not present

## 2019-12-27 NOTE — Patient Instructions (Addendum)
We'll call about seeing the skin clinic.   Check to see if you are still on carvedilol and let me know.   Keep the area clean and covered.  Update me as needed.  Take care.  Glad to see you.

## 2019-12-27 NOTE — Progress Notes (Signed)
This visit occurred during the SARS-CoV-2 public health emergency.  Safety protocols were in place, including screening questions prior to the visit, additional usage of staff PPE, and extensive cleaning of exam room while observing appropriate contact time as indicated for disinfecting solutions.  1cm lesion on the R cheek, with local thickening on the skin, present for a few months.  He can't really feel it from the inside of his mouth with his tongue.    L hand cut, either 2 or 3 days ago.  He was out working in the yard and tripped on a Secretary/administrator.  Tripped and had sprayer in the L hand, was wearing gloves.  [place] but got up and finished work.  Didn't notice cut on hand until he got back in the house.  He cleaned and covered the area.  Noted intermittent bleeding in the meantime.  Woke up in pain last night.  No fevers.  No pain now.  Today has been a "good day."  He can make a fist.  He didn't have a known penetrating injury, as the globe is not torn.  Tetanus 2020.    It was unclear if he was still on carvedilol and I want him to check his medicines at home and let me know.  He said he would.  Meds, vitals, and allergies reviewed.   ROS: Per HPI unless specifically indicated in ROS section   nad ncat Left hand with normal finger range of motion.  He can make a fist and he has normal flexion and extension on all 5 digits.  Neurovascularly intact distally.  Normal capillary refill. He has a 1.5 cm linear laceration on the palm of the hand without a gaping wound at this point.  No purulent discharge.  No spreading erythema.  No fluctuant mass.  Tissue appears well opposed.

## 2019-12-29 DIAGNOSIS — R21 Rash and other nonspecific skin eruption: Secondary | ICD-10-CM | POA: Insufficient documentation

## 2019-12-29 DIAGNOSIS — L989 Disorder of the skin and subcutaneous tissue, unspecified: Secondary | ICD-10-CM | POA: Insufficient documentation

## 2019-12-29 DIAGNOSIS — S61419A Laceration without foreign body of unspecified hand, initial encounter: Secondary | ICD-10-CM | POA: Insufficient documentation

## 2019-12-29 NOTE — Assessment & Plan Note (Signed)
1 cm lesion on the right cheek with local skin thickening and I would like dermatology input.  Refer.  He agrees.

## 2019-12-29 NOTE — Assessment & Plan Note (Signed)
Without any foreign body seen.  Given the timeline and the tissue apposition it likely makes sense not to try to reopen the wound or to try to suture closed.  Rationale discussed with patient.  He understood.  He agreed.  Tetanus is up-to-date.  Would not need prophylactic antibiotic at this point.  I did cover nonstick bandage with Neosporin and put that on the cut and then wrapped with a 4 x 4 over top of that/[place].  Tolerated well.  No complication.  He should be able to change the bandage daily from here on out and do fine.  He will update me as needed with routine cautions given to patient.  He agrees.

## 2020-01-02 ENCOUNTER — Telehealth: Payer: Self-pay

## 2020-01-02 NOTE — Telephone Encounter (Signed)
[place] Night - Client Nonclinical Telephone Record AccessNurse Client [facility] Night - Client Client Site [facility] Physician Renford Dills - MD Contact Type Call Who Is Calling Patient / Member / Family / Caregiver Caller Name Abdifatah Tarwater Caller Phone Number [phone] Patient Name [name] [name] Patient DOB April 22, 1941 Call Type Message Only Information Provided Reason for Call Request for General Office Information Initial Comment Caller states needed to leave message for physician on what medications he was taking. Carvedilol 6.25 mg Additional Comment Provided office hours. Disp. Time Disposition Final User 01/01/2020 5:08:09 PM General Information Provided Yes Idolina Primer Call Closed By: Idolina Primer Transaction Date/Time: 01/01/2020 5:04:16 PM (ET)

## 2020-01-06 NOTE — Telephone Encounter (Signed)
Noted. Thanks. Verify that he is taking 6.25mg  BID.  If so, have him monitor his pulse.  His last pulse here was 51.  If persistently below 55 or if lightheaded, then let me know.  We may need to change the dose.

## 2020-01-07 NOTE — Telephone Encounter (Signed)
Patient says he is taking Carvedilol 6.25 mg BID.  Patient says he is not getting light-headed at all and his pulse runs anywhere from 52 to 66.  Patient says he has always had a low pulse.

## 2020-01-08 ENCOUNTER — Telehealth: Payer: Self-pay

## 2020-01-08 DIAGNOSIS — E119 Type 2 diabetes mellitus without complications: Secondary | ICD-10-CM | POA: Diagnosis not present

## 2020-01-08 DIAGNOSIS — H179 Unspecified corneal scar and opacity: Secondary | ICD-10-CM | POA: Diagnosis not present

## 2020-01-08 DIAGNOSIS — H02401 Unspecified ptosis of right eyelid: Secondary | ICD-10-CM | POA: Diagnosis not present

## 2020-01-08 DIAGNOSIS — H02833 Dermatochalasis of right eye, unspecified eyelid: Secondary | ICD-10-CM | POA: Diagnosis not present

## 2020-01-08 DIAGNOSIS — H02836 Dermatochalasis of left eye, unspecified eyelid: Secondary | ICD-10-CM | POA: Diagnosis not present

## 2020-01-08 DIAGNOSIS — H401112 Primary open-angle glaucoma, right eye, moderate stage: Secondary | ICD-10-CM | POA: Diagnosis not present

## 2020-01-08 DIAGNOSIS — Z961 Presence of intraocular lens: Secondary | ICD-10-CM | POA: Diagnosis not present

## 2020-01-08 DIAGNOSIS — Z8669 Personal history of other diseases of the nervous system and sense organs: Secondary | ICD-10-CM | POA: Diagnosis not present

## 2020-01-08 NOTE — Telephone Encounter (Signed)
Noted. Thanks.

## 2020-01-08 NOTE — Telephone Encounter (Signed)
I need cardiology input.  I routed this to Dr. [name].  I thank all involved.

## 2020-01-08 NOTE — Telephone Encounter (Signed)
Patient brought in medication. Says the doctor wants to know if he's still taking CARVEDILOL 6.25MG . Patient is taking the medication and it was prescribed by G. [name] on 11/08/19. Patient says the dizzy spells has returned.

## 2020-01-10 NOTE — Telephone Encounter (Signed)
If he is dizzy, then he should reduce his coreg by half.

## 2020-02-06 ENCOUNTER — Other Ambulatory Visit: Payer: Self-pay | Admitting: Cardiovascular Disease

## 2020-02-12 ENCOUNTER — Other Ambulatory Visit: Payer: Self-pay

## 2020-02-12 ENCOUNTER — Ambulatory Visit (INDEPENDENT_AMBULATORY_CARE_PROVIDER_SITE_OTHER): Payer: PPO | Admitting: Family Medicine

## 2020-02-12 ENCOUNTER — Encounter: Payer: Self-pay | Admitting: Family Medicine

## 2020-02-12 VITALS — BP 150/70 | HR 68 | Temp 98.9°F | Ht 67.0 in | Wt 161.0 lb

## 2020-02-12 DIAGNOSIS — M549 Dorsalgia, unspecified: Secondary | ICD-10-CM

## 2020-02-12 DIAGNOSIS — I4891 Unspecified atrial fibrillation: Secondary | ICD-10-CM | POA: Diagnosis not present

## 2020-02-12 DIAGNOSIS — I4819 Other persistent atrial fibrillation: Secondary | ICD-10-CM

## 2020-02-12 MED ORDER — PREDNISONE 20 MG PO TABS
ORAL_TABLET | ORAL | 0 refills | Status: DC
Start: 2020-02-12 — End: 2020-04-16

## 2020-02-12 NOTE — Patient Instructions (Signed)
If this is not getting better in about a week, then I would start the prednisone that I gave to you.  Ice, heat +/- some tylenol at first - usually will get better with this.

## 2020-02-12 NOTE — Progress Notes (Signed)
Joseangel Nettleton T. Xaiden Fleig, MD, [place] at [facility] [place], [address]  Phone: [phone]  FAX: [phone]  [name] - 79 y.o. male  MRN [id number]  Date of Birth: 10/21/1940  Date: 02/12/2020  PCP: Tonia Ghent, MD  Referral: Tonia Ghent, MD  Chief Complaint  Patient presents with  . Back Pain    Hot water in shower this morning made it feel better    This visit occurred during the SARS-CoV-2 public health emergency.  Safety protocols were in place, including screening questions prior to the visit, additional usage of staff PPE, and extensive cleaning of exam room while observing appropriate contact time as indicated for disinfecting solutions.   Subjective:   [name] [name] is a 79 y.o. very pleasant male patient who presents with the following: Back Pain  ongoing for approximately: 1 week The patient has had minimal back pain before. The back pain is localized into the lumbar spine area. They also describe no radiculopathy.  Saturday was walking across his yard and then all of the sudden had a pain in the lowest part of his back.  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   Trouble bending over.  Has ben aggravating him.  Yesterday was hurting him a lot.  Tried to pick up a brick and it was hurting a lot.   Tried something over the counter - topical   No numbness or tingling. No bowel or bladder incontinence. No focal weakness. Prior interventions: None Physical therapy: No Chiropractic manipulations: No Acupuncture: No Osteopathic manipulation: No Heat or cold: Minimal effect  Lab Results  Component Value Date   HGBA1C 6.7 (H) 10/29/2019     Review of Systems is noted in the HPI, as appropriate  Objective:   Blood pressure (!) 150/70, pulse 68, temperature 98.9 F (37.2 C), temperature source Temporal, height 5\' 7"  (1.702 m), weight 161 lb (73 kg),  SpO2 97 %.  GEN: No acute distress; alert,appropriate. PULM: Breathing comfortably in no respiratory distress PSYCH: Normally interactive.   Range of motion at  the waist: Flexion, rotation and lateral bending: He is limited in forward flexion to approximately 80 degrees, but all other range of motion is full  No echymosis or edema Rises to examination table with no difficulty Gait: minimally antalgic  Inspection/Deformity: No abnormality Paraspinus T: Minimal, but more tender from L5-S1 bilaterally  B Ankle Dorsiflexion (L5,4): 5/5 B Great Toe Dorsiflexion (L5,4): 5/5 Heel Walk (L5): WNL Toe Walk (S1): WNL Rise/Squat (L4): WNL, mild pain  SENSORY B Medial Foot (L4): WNL B Dorsum (L5): WNL B Lateral (S1): WNL Light Touch: WNL Pinprick: WNL  REFLEXES Knee (L4): 2+ Ankle (S1): 2+  B SLR, seated: neg B SLR, supine: neg B FABER: neg B Reverse FABER: neg B Greater Troch: NT B Log Roll: neg B Stork: NT B Sciatic Notch: NT  Radiology: No results found.  Assessment and Plan:     ICD-10-CM   1. Acute back pain, unspecified back location, unspecified back pain laterality  M54.9   2. Atrial fibrillation, unspecified type ([place])  I48.91   3. Persistent atrial fibrillation (HCC)  I48.19     Level of Medical Decision-Making in this case is MODERATE.  Anatomy reviewed. Conservative algorithms for acute back pain generally begin with the following: NSAIDS, Muscle Relaxants, Mild pain medication, but the patient does take Eliquis as well as  Tikosyn.  I am going to give him 8 days of steroids to take.  Start with medications, core rehab, and progress from there following low back pain algorithm. No red flags are present.  Lab Results  Component Value Date   HGBA1C 6.7 (H) 10/29/2019     Follow-up: No follow-ups on file.  Meds ordered this encounter  Medications  . predniSONE (DELTASONE) 20 MG tablet    Sig: 2 tabs for 4 days, then 1 tab for 4 days    Dispense:  12  tablet    Refill:  0   There are no discontinued medications. No orders of the defined types were placed in this encounter.   Signed,  Maud Deed. Iolani Twilley, MD   Outpatient Encounter Medications as of 02/12/2020  Medication Sig  . acyclovir (ZOVIRAX) 800 MG tablet Take 1 tablet (800 mg total) by mouth 2 (two) times daily.  Marland Kitchen amLODipine (NORVASC) 5 MG tablet TAKE 1 TABLET BY MOUTH ONCE DAILY  . carvedilol (COREG) 6.25 MG tablet 1 tab by mouth twice a day.  . COD LIVER OIL PO Take 1 capsule by mouth 2 (two) times daily.  Marland Kitchen dofetilide (TIKOSYN) 500 MCG capsule Take 1 capsule (500 mcg total) by mouth 2 (two) times daily.  Marland Kitchen ELIQUIS 5 MG TABS tablet TAKE 1 TABLET BY MOUTH TWICE DAILY  . ezetimibe (ZETIA) 10 MG tablet TAKE 1 TABLET BY MOUTH EVERY DAY  . gabapentin (NEURONTIN) 600 MG tablet Take 1 tablet (600 mg total) by mouth 3 (three) times daily as needed.  . latanoprost (XALATAN) 0.005 % ophthalmic solution Place 1 drop into the right eye every morning.  . loratadine (CLARITIN) 10 MG tablet Take 1 tablet (10 mg total) by mouth daily.  Marland Kitchen losartan (COZAAR) 100 MG tablet TAKE 1 TABLET (100 MG TOTAL) BY MOUTH DAILY. PLEASE KEEP UPCOMING APPT FOR FUTURE REFILLS. THANK YOU  . metFORMIN (GLUCOPHAGE) 1000 MG tablet Take 1 tablet (1,000 mg total) by mouth 2 (two) times daily with a meal.  . nitroGLYCERIN (NITROSTAT) 0.4 MG SL tablet Place 1 tablet (0.4 mg total) under the tongue every 5 (five) minutes as needed for chest pain (max 3 doses).  . timolol (BETIMOL) 0.5 % ophthalmic solution Place 1 drop into both eyes 2 (two) times daily.  . timolol (TIMOPTIC) 0.5 % ophthalmic solution 1 drop 2 (two) times daily.  . Travoprost, BAK Free, (TRAVATAN) 0.004 % SOLN ophthalmic solution 1 drop at bedtime.  . valACYclovir (VALTREX) 1000 MG tablet Take 1,000 mg by mouth in the morning and at bedtime.  . predniSONE (DELTASONE) 20 MG tablet 2 tabs for 4 days, then 1 tab for 4 days   No facility-administered  encounter medications on file as of 02/12/2020.

## 2020-02-13 ENCOUNTER — Ambulatory Visit: Payer: PPO | Admitting: Family Medicine

## 2020-03-02 ENCOUNTER — Ambulatory Visit: Payer: PPO | Admitting: Cardiovascular Disease

## 2020-03-23 DIAGNOSIS — E119 Type 2 diabetes mellitus without complications: Secondary | ICD-10-CM | POA: Diagnosis not present

## 2020-03-23 DIAGNOSIS — H401134 Primary open-angle glaucoma, bilateral, indeterminate stage: Secondary | ICD-10-CM | POA: Diagnosis not present

## 2020-03-23 DIAGNOSIS — H2512 Age-related nuclear cataract, left eye: Secondary | ICD-10-CM | POA: Diagnosis not present

## 2020-03-23 DIAGNOSIS — H25012 Cortical age-related cataract, left eye: Secondary | ICD-10-CM | POA: Diagnosis not present

## 2020-03-23 LAB — HM DIABETES EYE EXAM

## 2020-04-07 DIAGNOSIS — H25012 Cortical age-related cataract, left eye: Secondary | ICD-10-CM | POA: Diagnosis not present

## 2020-04-07 DIAGNOSIS — H2512 Age-related nuclear cataract, left eye: Secondary | ICD-10-CM | POA: Diagnosis not present

## 2020-04-07 DIAGNOSIS — H25812 Combined forms of age-related cataract, left eye: Secondary | ICD-10-CM | POA: Diagnosis not present

## 2020-04-16 ENCOUNTER — Ambulatory Visit (INDEPENDENT_AMBULATORY_CARE_PROVIDER_SITE_OTHER): Payer: PPO | Admitting: Family Medicine

## 2020-04-16 ENCOUNTER — Other Ambulatory Visit: Payer: Self-pay

## 2020-04-16 ENCOUNTER — Encounter: Payer: Self-pay | Admitting: Family Medicine

## 2020-04-16 VITALS — BP 150/68 | HR 57 | Temp 97.0°F | Ht 67.0 in | Wt 159.3 lb

## 2020-04-16 DIAGNOSIS — E1149 Type 2 diabetes mellitus with other diabetic neurological complication: Secondary | ICD-10-CM | POA: Diagnosis not present

## 2020-04-16 DIAGNOSIS — R197 Diarrhea, unspecified: Secondary | ICD-10-CM | POA: Diagnosis not present

## 2020-04-16 LAB — COMPREHENSIVE METABOLIC PANEL
ALT: 11 U/L (ref 0–53)
AST: 15 U/L (ref 0–37)
Albumin: 4.5 g/dL (ref 3.5–5.2)
Alkaline Phosphatase: 72 U/L (ref 39–117)
BUN: 15 mg/dL (ref 6–23)
CO2: 27 mEq/L (ref 19–32)
Calcium: 9.6 mg/dL (ref 8.4–10.5)
Chloride: 103 mEq/L (ref 96–112)
Creatinine, Ser: 0.81 mg/dL (ref 0.40–1.50)
GFR: 91.73 mL/min (ref >60.00)
Glucose, Bld: 128 mg/dL — ABNORMAL HIGH (ref 70–99)
Potassium: 4.2 mEq/L (ref 3.5–5.1)
Sodium: 139 mEq/L (ref 135–145)
Total Bilirubin: 1.1 mg/dL (ref 0.2–1.2)
Total Protein: 6.9 g/dL (ref 6.0–8.3)

## 2020-04-16 LAB — CBC WITH DIFFERENTIAL/PLATELET
Basophils Absolute: 0 10*3/uL (ref 0.0–0.1)
Basophils Relative: 0.5 % (ref 0.0–3.0)
Eosinophils Absolute: 0.2 10*3/uL (ref 0.0–0.7)
Eosinophils Relative: 2.7 % (ref 0.0–5.0)
HCT: 45.4 % (ref 39.0–52.0)
Hemoglobin: 15.3 g/dL (ref 13.0–17.0)
Lymphocytes Relative: 37.9 % (ref 12.0–46.0)
Lymphs Abs: 2.6 10*3/uL (ref 0.7–4.0)
MCHC: 33.8 g/dL (ref 30.0–36.0)
MCV: 96.6 fl (ref 78.0–100.0)
Monocytes Absolute: 0.5 10*3/uL (ref 0.1–1.0)
Monocytes Relative: 7.8 % (ref 3.0–12.0)
Neutro Abs: 3.5 10*3/uL (ref 1.4–7.7)
Neutrophils Relative %: 51.1 % (ref 43.0–77.0)
Platelets: 163 10*3/uL (ref 150.0–400.0)
RBC: 4.7 Mil/uL (ref 4.22–5.81)
RDW: 13.8 % (ref 11.5–15.5)
WBC: 6.8 10*3/uL (ref 4.0–10.5)

## 2020-04-16 LAB — HEMOGLOBIN A1C: Hgb A1c MFr Bld: 6.4 % (ref 4.6–6.5)

## 2020-04-16 MED ORDER — CLOTRIMAZOLE-BETAMETHASONE 1-0.05 % EX CREA: 1.0000 "application " | TOPICAL_CREAM | Freq: Every day | CUTANEOUS | 2 refills | Status: DC | PRN

## 2020-04-16 NOTE — Patient Instructions (Addendum)
Go to the lab on the way out.   If you have mychart we'll likely use that to update you.    Pick up the stool kit on the way out.   Stop the collagen supplement. If not better then cut metformin back to 1/2 pill twice a day for a few days.   Either way, update me.  Take care.  Glad to see you.

## 2020-04-16 NOTE — Progress Notes (Signed)
This visit occurred during the SARS-CoV-2 public health emergency.  Safety protocols were in place, including screening questions prior to the visit, additional usage of staff PPE, and extensive cleaning of exam room while observing appropriate contact time as indicated for disinfecting solutions.  He noted changes.  His stools is narrower.  Then he'll have watery diarrhea.  Going on for about 3 months.  No blood in stool.  Weight is slightly lower.  No black stools.  No fevers.  Some occ abd discomfort but not ttp.  No vomiting.  Appetite is still good.    He has been on metformin for extended period of time.  No one else is sick with similar symptoms at home.  No mucous or greasy stools.  Colonoscopy 2018.  He had been taking a collagen supplement, unclear if that contributes to recent sx.    He has used lotrisone with relief of hemorrhoid/rectal irritation.  rx sent.    Meds, vitals, and allergies reviewed.   ROS: Per HPI unless specifically indicated in ROS section   GEN: nad, alert and oriented HEENT: ncat NECK: supple w/o LA CV: rrr. PULM: ctab, no inc wob ABD: soft, +bs, not tender. EXT: no edema SKIN: no acute rash

## 2020-04-17 ENCOUNTER — Other Ambulatory Visit: Payer: PPO

## 2020-04-17 DIAGNOSIS — R197 Diarrhea, unspecified: Secondary | ICD-10-CM

## 2020-04-19 DIAGNOSIS — R197 Diarrhea, unspecified: Secondary | ICD-10-CM | POA: Insufficient documentation

## 2020-04-19 NOTE — Assessment & Plan Note (Signed)
We talked about options at this point.  Nontoxic.  Okay for outpatient follow-up. He will pick up the stool kit on the way out.   See notes on labs. Stop the collagen supplement. If not better then cut metformin back to 1/2 pill twice a day for a few days.  Either way he will update me about his situation.  We can go from there.  He agrees.  It may be that either the collagen or the Metformin are contributing to his current symptoms.

## 2020-04-20 LAB — GASTROINTESTINAL PATHOGEN PANEL PCR
C. difficile Tox A/B, PCR: NOT DETECTED
Campylobacter, PCR: NOT DETECTED
Cryptosporidium, PCR: NOT DETECTED
E coli (ETEC) LT/ST PCR: NOT DETECTED
E coli (STEC) stx1/stx2, PCR: NOT DETECTED
E coli 0157, PCR: NOT DETECTED
Giardia lamblia, PCR: NOT DETECTED
Norovirus, PCR: NOT DETECTED
Rotavirus A, PCR: NOT DETECTED
Salmonella, PCR: NOT DETECTED
Shigella, PCR: NOT DETECTED

## 2020-04-22 LAB — HM DIABETES EYE EXAM

## 2020-04-27 ENCOUNTER — Encounter: Payer: Self-pay | Admitting: Family Medicine

## 2020-04-27 DIAGNOSIS — S0502XD Injury of conjunctiva and corneal abrasion without foreign body, left eye, subsequent encounter: Secondary | ICD-10-CM | POA: Diagnosis not present

## 2020-04-27 DIAGNOSIS — H10212 Acute toxic conjunctivitis, left eye: Secondary | ICD-10-CM | POA: Diagnosis not present

## 2020-04-29 ENCOUNTER — Encounter: Payer: Self-pay | Admitting: Family Medicine

## 2020-04-29 DIAGNOSIS — H10212 Acute toxic conjunctivitis, left eye: Secondary | ICD-10-CM | POA: Diagnosis not present

## 2020-04-29 DIAGNOSIS — S0502XD Injury of conjunctiva and corneal abrasion without foreign body, left eye, subsequent encounter: Secondary | ICD-10-CM | POA: Diagnosis not present

## 2020-04-29 LAB — HM DIABETES EYE EXAM

## 2020-05-07 ENCOUNTER — Ambulatory Visit: Payer: PPO | Admitting: Family Medicine

## 2020-05-14 ENCOUNTER — Encounter: Payer: Self-pay | Admitting: Family Medicine

## 2020-05-18 LAB — HM DIABETES EYE EXAM

## 2020-05-21 ENCOUNTER — Ambulatory Visit (INDEPENDENT_AMBULATORY_CARE_PROVIDER_SITE_OTHER): Payer: PPO | Admitting: Family Medicine

## 2020-05-21 ENCOUNTER — Encounter: Payer: Self-pay | Admitting: Family Medicine

## 2020-05-21 ENCOUNTER — Other Ambulatory Visit: Payer: Self-pay

## 2020-05-21 VITALS — BP 130/62 | HR 48 | Temp 97.9°F | Ht 67.0 in | Wt 163.0 lb

## 2020-05-21 DIAGNOSIS — Z23 Encounter for immunization: Secondary | ICD-10-CM | POA: Diagnosis not present

## 2020-05-21 DIAGNOSIS — E1149 Type 2 diabetes mellitus with other diabetic neurological complication: Secondary | ICD-10-CM | POA: Diagnosis not present

## 2020-05-21 LAB — POCT GLYCOSYLATED HEMOGLOBIN (HGB A1C): Hemoglobin A1C: 6.6 % — AB (ref 4.0–5.6)

## 2020-05-21 MED ORDER — METFORMIN HCL 850 MG PO TABS: 850.0000 mg | ORAL_TABLET | Freq: Two times a day (BID) | ORAL | Status: DC

## 2020-05-21 MED ORDER — METFORMIN HCL 850 MG PO TABS: 850.0000 mg | ORAL_TABLET | Freq: Two times a day (BID) | ORAL | 3 refills | Status: DC

## 2020-05-21 NOTE — Progress Notes (Signed)
This visit occurred during the SARS-CoV-2 public health emergency.  Safety protocols were in place, including screening questions prior to the visit, additional usage of staff PPE, and extensive cleaning of exam room while observing appropriate contact time as indicated for disinfecting solutions.  Diabetes:  Using medications without difficulties: yes, tolerating Metformin 850mg  BID w/dec in loose stools.   Hypoglycemic episodes: no sx  Hyperglycemic episodes: no sx Feet problems: some relief with gabapentin, used prn with relief.   Blood Sugars averaging: not checked.   eye exam within last year: yes, no retinopathy.   He has h/o bradycardia.  He   Meds, vitals, and allergies reviewed.   ROS: Per HPI unless specifically indicated in ROS section   GEN: nad, alert and oriented HEENT: ncat NECK: supple w/o LA CV: rrr. PULM: ctab, no inc wob ABD: soft, +bs EXT: no edema SKIN: no acute rash

## 2020-05-21 NOTE — Patient Instructions (Addendum)
Don't change your meds for now.  Update me as needed.  Recheck in about 6 months at a physical, labs ahead of time if possible.   Take care.  Glad to see you.

## 2020-05-22 ENCOUNTER — Encounter: Payer: Self-pay | Admitting: Family Medicine

## 2020-05-25 NOTE — Assessment & Plan Note (Signed)
Continue Metformin 850 mg twice a day.  Update me as needed.  Recheck in about 6 months at a physical, labs ahead of time if possible.  Continue work on diet and exercise.  He agrees.

## 2020-06-01 DIAGNOSIS — S0502XD Injury of conjunctiva and corneal abrasion without foreign body, left eye, subsequent encounter: Secondary | ICD-10-CM | POA: Diagnosis not present

## 2020-06-01 LAB — HM DIABETES EYE EXAM

## 2020-06-01 NOTE — Progress Notes (Signed)
Cardiology Office Note  Date:  06/02/2020   ID:  Quintez, [name] [name] [birth year], MRN [id number]  PCP:  Tonia Ghent, MD   Chief Complaint  Patient presents with  . office visit    12 month F/U; Meds verbally reviewed with patient.    HPI:  [name] is a 79 year old gentleman with past medical history of Hypertension Diabetes Coronary artery disease, bypass surgery 6  2015 Ejection fraction 55% by echo July 2018 mildly dilated aortic root, PFO Previous strokes,  atrial fib, on eliquis, CHA2DS2VASc = 7);  Cardioversion 08/24/2017 admitted February 2019  placed on dofetilide and reverted back to NSR.   No prolongation of the QT interval.  Who presents for follow-up of his coronary artery disease and atrial fibrillation  Last seen in clinic August 2020 by myself Seen by Dr. [name] last in April 2021 Reported having occasional breakthrough palpitations Metoprolol was held and he was started on carvedilol 6.25 twice daily Dofetilide continued  Manages a farm, active at baseline Neuropathy in the feet, some tingling Previously going to the [facility], does exercise at home  Asymptomatic bradycardia  Lab work reviewed  Hemoglobin A1c 6.7 down to 6.4 up to 6.6 Total cholesterol 103 LDL 51  Atrial fibrillation Early 2019, With straining, lifting, heavy lifting, had atrial fib  EKG personally reviewed by myself on todays visit Sinus brady rate 42 bpm  history of coronary disease and bypass 2015 Long history of Statins myalgia:   PMH:   has a past medical history of Anxiety, Back pain, Coronary artery disease (08/21/2013), Diabetes mellitus, Diabetic neuropathy ([place]), Diastolic dysfunction, Diverticulosis, Diverticulosis, Embolic stroke ([place]), Erectile dysfunction, Family history of anesthesia complication, Fatty liver, Flank pain, GERD (gastroesophageal reflux disease), History of bronchitis, History of chicken pox, HSV infection, Hypercholesterolemia, Hypertension, Kidney stones,  Osteoarthritis, Persistent atrial fibrillation ([place]), and PFO (patent foramen ovale).  PSH:    Past Surgical History:  Procedure Laterality Date  . CARDIAC CATHETERIZATION  08/21/2013   DR Angelena Form  . CARDIOVERSION N/A 08/24/2017   Procedure: CARDIOVERSION;  Surgeon: Minna Merritts, MD;  Location: [facility] ORS;  Service: Cardiovascular;  Laterality: N/A;  . CHOLECYSTECTOMY  1983  . CORONARY ARTERY BYPASS GRAFT N/A 08/23/2013   Procedure: CORONARY ARTERY BYPASS GRAFTING (CABG);  Surgeon: Grace Isaac, MD;  Location: [facility];  Service: Open Heart Surgery;  Laterality: N/A;  CABG times six utilizing the left internal mammary artery and the right greater saphenous vein harvested endoscopically  . ENDOVEIN HARVEST OF GREATER SAPHENOUS VEIN Right 08/23/2013   Procedure: ENDOVEIN HARVEST OF GREATER SAPHENOUS VEIN;  Surgeon: Grace Isaac, MD;  Location: [facility];  Service: Open Heart Surgery;  Laterality: Right;  . HERNIA REPAIR     X3  . INTRAOPERATIVE TRANSESOPHAGEAL ECHOCARDIOGRAM N/A 08/23/2013   Procedure: INTRAOPERATIVE TRANSESOPHAGEAL ECHOCARDIOGRAM;  Surgeon: Grace Isaac, MD;  Location: [facility];  Service: Open Heart Surgery;  Laterality: N/A;  . LEFT HEART CATHETERIZATION WITH CORONARY ANGIOGRAM N/A 08/21/2013   Procedure: LEFT HEART CATHETERIZATION WITH CORONARY ANGIOGRAM;  Surgeon: Burnell Blanks, MD;  Location[facility] CATH LAB;  Service: Cardiovascular;  Laterality: N/A;    Current Outpatient Medications  Medication Sig Dispense Refill  . acyclovir (ZOVIRAX) 800 MG tablet Take 1 tablet (800 mg total) by mouth 2 (two) times daily. 180 tablet 3  . amLODipine (NORVASC) 5 MG tablet TAKE 1 TABLET BY MOUTH ONCE DAILY 90 tablet 8  . carvedilol (COREG) 6.25 MG tablet 1 tab by mouth twice  a day.    . clotrimazole-betamethasone (LOTRISONE) cream Apply 1 application topically daily as needed. 30 g 2  . COD LIVER OIL PO Take 1 capsule by mouth 2 (two) times daily.    Marland Kitchen dofetilide (TIKOSYN)  500 MCG capsule Take 1 capsule (500 mcg total) by mouth 2 (two) times daily. 180 capsule 3  . ELIQUIS 5 MG TABS tablet TAKE 1 TABLET BY MOUTH TWICE DAILY 180 tablet 1  . ezetimibe (ZETIA) 10 MG tablet TAKE 1 TABLET BY MOUTH EVERY DAY 90 tablet 0  . gabapentin (NEURONTIN) 600 MG tablet Take 1 tablet (600 mg total) by mouth 3 (three) times daily as needed.    . loratadine (CLARITIN) 10 MG tablet Take 1 tablet (10 mg total) by mouth daily.    Marland Kitchen losartan (COZAAR) 100 MG tablet TAKE 1 TABLET (100 MG TOTAL) BY MOUTH DAILY. PLEASE KEEP UPCOMING APPT FOR FUTURE REFILLS. THANK YOU 90 tablet 2  . metFORMIN (GLUCOPHAGE) 850 MG tablet Take 1 tablet (850 mg total) by mouth 2 (two) times daily with a meal. 180 tablet 3  . nitroGLYCERIN (NITROSTAT) 0.4 MG SL tablet Place 1 tablet (0.4 mg total) under the tongue every 5 (five) minutes as needed for chest pain (max 3 doses). 25 tablet 6  . timolol (BETIMOL) 0.5 % ophthalmic solution Place 1 drop into both eyes 2 (two) times daily.    . valACYclovir (VALTREX) 1000 MG tablet Take 1,000 mg by mouth in the morning and at bedtime.      No current facility-administered medications for this visit.     Allergies:   Fenofibrate, Fire ant, and Statins   Social History:  The patient  reports that he has never smoked. He has never used smokeless tobacco. He reports that he does not drink alcohol and does not use drugs.   Family History:   family history includes CAD in his brother; Colon cancer in his maternal grandmother; Diabetes in his mother; Heart disease in his father; Parkinsonism in his maternal grandfather; Prostate cancer in his maternal uncle; Stroke in his mother and paternal grandfather.    Review of Systems: Review of Systems  Constitutional: Negative.   Respiratory: Negative.   Cardiovascular: Negative.   Gastrointestinal: Negative.   Musculoskeletal: Negative.        Leg pain, foot pain  Neurological: Negative.   Psychiatric/Behavioral: Negative.    All other systems reviewed and are negative.   PHYSICAL EXAM: VS:  BP (!) 180/90 (BP Location: Left Arm, Patient Position: Sitting, Cuff Size: Normal)   Pulse (!) 46   Ht 5\' 7"  (1.702 m)   Wt 164 lb (74.4 kg)   SpO2 96%   BMI 25.69 kg/m  , BMI Body mass index is 25.69 kg/m. Constitutional:  oriented to person, place, and time. No distress.  HENT:  Head: Grossly normal Eyes:  no discharge. No scleral icterus.  Neck: No JVD, no carotid bruits  Cardiovascular: Bradycardic, normal rhythm, no murmurs appreciated Pulmonary/Chest: Clear to auscultation bilaterally, no wheezes or rails Abdominal: Soft.  no distension.  no tenderness.  Musculoskeletal: Normal range of motion Neurological:  normal muscle tone. Coordination normal. No atrophy Skin: Skin warm and dry Psychiatric: normal affect, pleasant   Recent Labs: 10/29/2019: TSH 1.80 04/16/2020: ALT 11; BUN 15; Creatinine, Ser 0.81; Hemoglobin 15.3; Platelets 163.0; Potassium 4.2; Sodium 139   Lipid Panel Lab Results  Component Value Date   CHOL 103 10/29/2019   HDL 28.70 (L) 10/29/2019   LDLCALC 62 06/07/2017  TRIG 218.0 (H) 10/29/2019     Wt Readings from Last 3 Encounters:  06/02/20 164 lb (74.4 kg)  05/21/20 163 lb (73.9 kg)  04/16/20 159 lb 5 oz (72.3 kg)    ASSESSMENT AND PLAN:  Persistent atrial fibrillation (HCC) Managed by Dr. [name], Sinus brady, no sx On Eliquis 5 twice daily On coreg per Dr. [name].  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 EKG 12-Lead    Total encounter time more than 25 minutes  Greater than 50% was spent in counseling and coordination of care with the patient   Signed, Esmond Plants, M.D., Ph.D. 06/02/2020  [place], [place]

## 2020-06-02 ENCOUNTER — Encounter: Payer: Self-pay | Admitting: Cardiovascular Disease

## 2020-06-02 ENCOUNTER — Ambulatory Visit (INDEPENDENT_AMBULATORY_CARE_PROVIDER_SITE_OTHER): Payer: PPO | Admitting: Cardiovascular Disease

## 2020-06-02 ENCOUNTER — Other Ambulatory Visit: Payer: Self-pay

## 2020-06-02 VITALS — BP 180/90 | HR 46 | Ht 67.0 in | Wt 164.0 lb

## 2020-06-02 DIAGNOSIS — I10 Essential (primary) hypertension: Secondary | ICD-10-CM

## 2020-06-02 DIAGNOSIS — T466X5A Adverse effect of antihyperlipidemic and antiarteriosclerotic drugs, initial encounter: Secondary | ICD-10-CM | POA: Diagnosis not present

## 2020-06-02 DIAGNOSIS — I739 Peripheral vascular disease, unspecified: Secondary | ICD-10-CM | POA: Diagnosis not present

## 2020-06-02 DIAGNOSIS — I25118 Atherosclerotic heart disease of native coronary artery with other forms of angina pectoris: Secondary | ICD-10-CM

## 2020-06-02 DIAGNOSIS — I4819 Other persistent atrial fibrillation: Secondary | ICD-10-CM

## 2020-06-02 DIAGNOSIS — I6529 Occlusion and stenosis of unspecified carotid artery: Secondary | ICD-10-CM | POA: Diagnosis not present

## 2020-06-02 DIAGNOSIS — E785 Hyperlipidemia, unspecified: Secondary | ICD-10-CM

## 2020-06-02 DIAGNOSIS — Z951 Presence of aortocoronary bypass graft: Secondary | ICD-10-CM

## 2020-06-02 DIAGNOSIS — I63429 Cerebral infarction due to embolism of unspecified anterior cerebral artery: Secondary | ICD-10-CM

## 2020-06-02 DIAGNOSIS — M791 Myalgia, unspecified site: Secondary | ICD-10-CM | POA: Diagnosis not present

## 2020-06-02 MED ORDER — AMLODIPINE BESYLATE 10 MG PO TABS: 10.0000 mg | ORAL_TABLET | Freq: Every day | ORAL | 3 refills | Status: DC

## 2020-06-02 NOTE — Patient Instructions (Addendum)
Medication Instructions:  Please increase the amlodipine up to 10 mg daily in the AM Take the losartan in the PM Take coreg twice a day    Make sure to check Blood Pressures 2 hours after your medications.   Please keep a log of your readings so we can see how they trend.  Avoid these things for 30 minutes before checking your blood pressure:  Drinking caffeine.  Drinking alcohol.  Eating.  Smoking.  Exercising.  Five minutes before checking your blood pressure:  Pee.  Sit in a dining chair. Avoid sitting in a soft couch or armchair.  Be quiet. Do not talk.   If you need a refill on your cardiac medications before your next appointment, please call your pharmacy.    Lab work: No new labs needed   If you have labs (blood work) drawn today and your tests are completely normal, you will receive your results only by: Marland Kitchen MyChart Message (if you have MyChart) OR . A paper copy in the mail If you have any lab test that is abnormal or we need to change your treatment, we will call you to review the results.   Testing/Procedures: No new testing needed   Follow-Up: At [facility], you and your health needs are our priority.  As part of our continuing mission to provide you with exceptional heart care, we have created designated Provider Care Teams.  These Care Teams include your primary Cardiologist (physician) and Advanced Practice Providers (APPs -  Physician Assistants and Nurse Practitioners) who all work together to provide you with the care you need, when you need it.  . You will need a follow up appointment in 3 month  . Providers on your designated Care Team:   . Murray Hodgkins, NP . Christell Faith, PA-C . Marrianne Mood, PA-C  Any Other Special Instructions Will Be Listed Below (If Applicable).  COVID-19 Vaccine Information can be found at: ShippingScam.co.uk For questions related to vaccine  distribution or appointments, please email vaccine@[place] .com or call [phone].     How to Take Your Blood Pressure You can take your blood pressure at home with a machine. You may need to check your blood pressure at home:  To check if you have high blood pressure (hypertension).  To check your blood pressure over time.  To make sure your blood pressure medicine is working. Supplies needed: You will need a blood pressure machine, or monitor. You can buy one at a drugstore or online. When choosing one:  Choose one with an arm cuff.  Choose one that wraps around your upper arm. Only one finger should fit between your arm and the cuff.  Do not choose one that measures your blood pressure from your wrist or finger. Your doctor can suggest a monitor. How to prepare Avoid these things for 30 minutes before checking your blood pressure:  Drinking caffeine.  Drinking alcohol.  Eating.  Smoking.  Exercising. Five minutes before checking your blood pressure:  Pee.  Sit in a dining chair. Avoid sitting in a soft couch or armchair.  Be quiet. Do not talk. How to take your blood pressure Follow the instructions that came with your machine. If you have a digital blood pressure monitor, these may be the instructions: 1. Sit up straight. 2. Place your feet on the floor. Do not cross your ankles or legs. 3. Rest your left arm at the level of your heart. You may rest it on a table, desk, or chair. 4.  Pull up your shirt sleeve. 5. Wrap the blood pressure cuff around the upper part of your left arm. The cuff should be 1 inch (2.5 cm) above your elbow. It is best to wrap the cuff around bare skin. 6. Fit the cuff snugly around your arm. You should be able to place only one finger between the cuff and your arm. 7. Put the cord inside the groove of your elbow. 8. Press the power button. 9. Sit quietly while the cuff fills with air and loses air. 10. Write down the numbers on the  screen. 11. Wait 2-3 minutes and then repeat steps 1-10. What do the numbers mean? Two numbers make up your blood pressure. The first number is called systolic pressure. The second is called diastolic pressure. An example of a blood pressure reading is "120 over 80" (or 120/80). If you are an adult and do not have a medical condition, use this guide to find out if your blood pressure is normal: Normal  First number: below 120.  Second number: below 80. Elevated  First number: 120-129.  Second number: below 80. Hypertension stage 1  First number: 130-139.  Second number: 80-89. Hypertension stage 2  First number: 140 or above.  Second number: 35 or above. Your blood pressure is above normal even if only the top or bottom number is above normal. Follow these instructions at home:  Check your blood pressure as often as your doctor tells you to.  Take your monitor to your next doctor's appointment. Your doctor will: ? Make sure you are using it correctly. ? Make sure it is working right.  Make sure you understand what your blood pressure numbers should be.  Tell your doctor if your medicines are causing side effects. Contact a doctor if:  Your blood pressure keeps being high. Get help right away if:  Your first blood pressure number is higher than 180.  Your second blood pressure number is higher than 120. This information is not intended to replace advice given to you by your health care provider. Make sure you discuss any questions you have with your health care provider. Document Revised: 07/07/2017 Document Reviewed: 01/01/2016 Elsevier Patient Education  2020 [employer].

## 2020-06-04 ENCOUNTER — Encounter: Payer: Self-pay | Admitting: Family Medicine

## 2020-06-14 ENCOUNTER — Other Ambulatory Visit: Payer: Self-pay | Admitting: Internal Medicine

## 2020-06-15 NOTE — Telephone Encounter (Signed)
Pt last saw Dr Rockey Situ 06/02/20, last labs 04/16/20 Creat 0.81, age 79, weight 74.4kg, based on specified criteria pt is on appropriate dosage of Eliquis 5mg  BID.  Will refill rx.

## 2020-06-16 DIAGNOSIS — H16141 Punctate keratitis, right eye: Secondary | ICD-10-CM | POA: Diagnosis not present

## 2020-06-16 DIAGNOSIS — S0502XD Injury of conjunctiva and corneal abrasion without foreign body, left eye, subsequent encounter: Secondary | ICD-10-CM | POA: Diagnosis not present

## 2020-06-17 ENCOUNTER — Other Ambulatory Visit: Payer: Self-pay | Admitting: Cardiovascular Disease

## 2020-06-17 ENCOUNTER — Other Ambulatory Visit: Payer: Self-pay | Admitting: Internal Medicine

## 2020-06-29 ENCOUNTER — Telehealth: Payer: Self-pay | Admitting: *Deleted

## 2020-06-29 DIAGNOSIS — Z79899 Other long term (current) drug therapy: Secondary | ICD-10-CM

## 2020-06-29 DIAGNOSIS — I10 Essential (primary) hypertension: Secondary | ICD-10-CM

## 2020-06-29 NOTE — Telephone Encounter (Signed)
Called pt and spoke with him regarding his BP and HR trend over the past week, pt was able to give am and pm measurements since 11/14. 11/14 am 173/83 HR 53           pm 173/86 HR 62  11/15 am 178/88 HR 55            pm 161/83 HR 65  11/16 am 172/81 HR 62           pm 152/78 HR 67  11/17 am 178/84 HR 51           pm 173/86 HR 56  11/18 missed readings  11/19 am 166/84 HR 57           pm 165/90 HR 67  11/20 missed readings  11/21 am 159/87 HR 69           pm 151/75 HR 58  11/22 am 156/77 HR 60  Pt reported no s/s of CP, palpitations, increase swelling, sob, or any other concerns, reported has been following medications regiments as stated by Dr. Rockey Situ, and keeping up with his BP journal. [name] that his BP is "going in the right direction" and has been waiting to get BP readings around the same time everyday at least 2 hours after medication ingestion instead of bouncing around as before. Will follow-up as requested and will call office for any concerns or questions.

## 2020-06-29 NOTE — Telephone Encounter (Signed)
-----   Message from Minna Merritts, MD sent at 06/28/2020  1:43 PM EST ----- On his last clinic visit blood pressure was very elevated 457 systolic He was going to check some blood pressures for [place] and call in I do not have anything documented, could we call him and find out where his blood pressure is running so we can document Thx TGollan ----- Message ----- From: Minna Merritts, MD Sent: 06/02/2020   6:19 PM EST To: Minna Merritts, MD  F/u on blood pressure

## 2020-07-05 NOTE — Telephone Encounter (Signed)
Blood pressure is running high pretty consistently Would make sure he is on amlodipine 5 twice daily and losartan 100 daily If he is on the above, we will need to add additional agent, goal blood pressure 130 or less Several options, will try 1 a day medication first Consider starting HCTZ 25 mg daily Would need BMP in 2 to 3 weeks time If any side effects we will pick an alternate medication

## 2020-07-06 MED ORDER — HYDROCHLOROTHIAZIDE 25 MG PO TABS: 25.0000 mg | ORAL_TABLET | Freq: Every day | ORAL | 4 refills | Status: DC

## 2020-07-06 NOTE — Telephone Encounter (Signed)
Spoke to patient. Reports he is taking the amlodipine 10 mg daily in the morning and losartan 100 mg in the PM.  He is agreeable to start HCTZ 25 mg daily and get repeat lab work in 2-3 weeks at the [facility]. Rx sent to pharmacy.

## 2020-07-07 ENCOUNTER — Other Ambulatory Visit: Payer: Self-pay

## 2020-07-07 ENCOUNTER — Encounter: Payer: Self-pay | Admitting: Family Medicine

## 2020-07-07 ENCOUNTER — Ambulatory Visit (INDEPENDENT_AMBULATORY_CARE_PROVIDER_SITE_OTHER): Payer: PPO | Admitting: Family Medicine

## 2020-07-07 VITALS — BP 124/82 | HR 75 | Temp 97.6°F | Ht 67.0 in | Wt 164.0 lb

## 2020-07-07 DIAGNOSIS — M5430 Sciatica, unspecified side: Secondary | ICD-10-CM | POA: Diagnosis not present

## 2020-07-07 MED ORDER — GABAPENTIN 600 MG PO TABS
300.0000 mg | ORAL_TABLET | Freq: Three times a day (TID) | ORAL | 1 refills | Status: DC | PRN
Start: 2020-07-07 — End: 2023-02-17

## 2020-07-07 NOTE — Progress Notes (Signed)
This visit occurred during the SARS-CoV-2 public health emergency.  Safety protocols were in place, including screening questions prior to the visit, additional usage of staff PPE, and extensive cleaning of exam room while observing appropriate contact time as indicated for disinfecting solutions.  Leg pain.  2 weeks, worse in the meantime.  Fell yesterday from pain.  No injury at the time.  No R leg sx.  Lower back pain down the L leg, almost to the L foot.  Quick onset of pain but not all the time.  Pain in some positions, more than other others.  No FCNAVD.  No similar sx prev.  No trauma to cause the sx. No dysuria.  He has previously been on gabapentin and tolerated that without troubles but he has been off the medication recently.  Last A1c 6.6.   Meds, vitals, and allergies reviewed.   ROS: Per HPI unless specifically indicated in ROS section   nad ncat Neck supple, no LA rrr ctab abd soft, not ttp Back nontender to midline S/S wnl BLE  SLR neg at OV but had been positive prev per patient report.   Able to bear weight.

## 2020-07-07 NOTE — Patient Instructions (Signed)
Gradually restart gabapentin.  Start with 1/2 tab (300mg ) a day and increase as needed.  Update me if not better.  Use the home exercises.  Take care.  Glad to see you.

## 2020-07-08 DIAGNOSIS — M5430 Sciatica, unspecified side: Secondary | ICD-10-CM | POA: Insufficient documentation

## 2020-07-08 NOTE — Assessment & Plan Note (Signed)
Discussed options.  No weakness now and okay for outpatient follow-up.  Would not need imaging right now as it is unlikely to change the plan.  Discussed with patient.  He agreed.  Restart gabapentin 300 mg a day and then gradually titrate up as needed/tolerated.  Routine cautions given to patient.  Home exercise program instructions given to patient and explained/demonstrated.  He should be able to use those at home and get some relief.  He will update me as needed.

## 2020-07-09 ENCOUNTER — Encounter: Payer: Self-pay | Admitting: Cardiovascular Disease

## 2020-07-09 ENCOUNTER — Other Ambulatory Visit: Payer: Self-pay

## 2020-07-09 MED ORDER — HYDROCHLOROTHIAZIDE 25 MG PO TABS: 25.0000 mg | ORAL_TABLET | Freq: Every day | ORAL | 0 refills | Status: DC

## 2020-07-09 NOTE — Telephone Encounter (Signed)
*  STAT* If patient is at the pharmacy, call can be transferred to refill team.   1. Which medications need to be refilled? (please list name of each medication and dose if known) Hydrochlorothiazide  2. Which pharmacy/location (including street and city if local pharmacy) is medication to be sent to? [facility]  3. Do they need a 30 day or 90 day supply? [place]

## 2020-07-09 NOTE — Telephone Encounter (Signed)
This encounter was created in error - please disregard.

## 2020-07-16 ENCOUNTER — Other Ambulatory Visit: Payer: Self-pay

## 2020-07-16 ENCOUNTER — Encounter: Payer: Self-pay | Admitting: Family Medicine

## 2020-07-16 ENCOUNTER — Ambulatory Visit (INDEPENDENT_AMBULATORY_CARE_PROVIDER_SITE_OTHER): Payer: Self-pay | Admitting: Family Medicine

## 2020-07-16 DIAGNOSIS — M5430 Sciatica, unspecified side: Secondary | ICD-10-CM

## 2020-07-16 NOTE — Progress Notes (Signed)
This visit occurred during the SARS-CoV-2 public health emergency.  Safety protocols were in place, including screening questions prior to the visit, additional usage of staff PPE, and extensive cleaning of exam room while observing appropriate contact time as indicated for disinfecting solutions.  Leg pain continues.  Taking gabapentin 300mg  BID.  That is helping some with the pain but it isn't resolved. Still with L lower back and leg pain.  No R sided pain.  He has been exercising/stretching at home.  Radiates down the L leg.  No urinary sx.  No FCNAVD.  He is careful about getting in a chair and then stretching.  Stretching clearly helps with his pain.  Meds, vitals, and allergies reviewed.   ROS: Per HPI unless specifically indicated in ROS section   nad ncat rrr ctab SLR neg Able to bear weight.  Strength and sensation grossly intact in lower extremities.

## 2020-07-16 NOTE — Patient Instructions (Signed)
Try taking an extra 1/2 tab of gabapentin in the middle of the day.  See if that helps and is tolerable.   Keep stretching.  Let me know if you continue to have troubles.  Okay to defer the xray today.  Take care.  Glad to see you. No charge for visit.

## 2020-07-19 NOTE — Assessment & Plan Note (Signed)
He is improving with stretching.  No new/emergent symptoms. Discussed that he could try taking an extra 1/2 tab of gabapentin in the middle of the day.  He will see if that helps and is tolerable.   Advised to keep stretching.  He can update me as needed. Okay to defer xray today.  No charge for visit given the brevity of the encounter and the lack of intervention otherwise today.

## 2020-07-23 ENCOUNTER — Ambulatory Visit (INDEPENDENT_AMBULATORY_CARE_PROVIDER_SITE_OTHER)
Admission: RE | Admit: 2020-07-23 | Discharge: 2020-07-23 | Disposition: A | Discharge status: Home / Self Care | Payer: PPO | Source: Ambulatory Visit | Attending: Family Medicine | Admitting: Family Medicine

## 2020-07-23 ENCOUNTER — Ambulatory Visit (INDEPENDENT_AMBULATORY_CARE_PROVIDER_SITE_OTHER): Payer: PPO | Admitting: Family Medicine

## 2020-07-23 ENCOUNTER — Other Ambulatory Visit: Payer: Self-pay

## 2020-07-23 ENCOUNTER — Ambulatory Visit: Payer: PPO | Admitting: Family Medicine

## 2020-07-23 VITALS — BP 150/60 | HR 60 | Temp 95.7°F | Ht 67.0 in | Wt 163.8 lb

## 2020-07-23 DIAGNOSIS — M5430 Sciatica, unspecified side: Secondary | ICD-10-CM

## 2020-07-23 DIAGNOSIS — M419 Scoliosis, unspecified: Secondary | ICD-10-CM | POA: Diagnosis not present

## 2020-07-23 DIAGNOSIS — M47816 Spondylosis without myelopathy or radiculopathy, lumbar region: Secondary | ICD-10-CM | POA: Diagnosis not present

## 2020-07-23 DIAGNOSIS — I700 Atherosclerosis of aorta: Secondary | ICD-10-CM | POA: Diagnosis not present

## 2020-07-23 NOTE — Progress Notes (Signed)
This visit occurred during the SARS-CoV-2 public health emergency.  Safety protocols were in place, including screening questions prior to the visit, additional usage of staff PPE, and extensive cleaning of exam room while observing appropriate contact time as indicated for disinfecting solutions.  L sided sciatica.  Worse a few days ago, some better today.  Still stretching and taking gabapentin at baseline.  Gabapentin helps some.  Not drowsy on med.  Cautions d/w pt.  No R sided sx.  No FCNAVD.    Meds, vitals, and allergies reviewed.   ROS: Per HPI unless specifically indicated in ROS section   nad ncat rrr ctab SLR neg Able to bear weight.  Strength and sensation grossly intact in lower extremities.

## 2020-07-23 NOTE — Patient Instructions (Signed)
Go to the lab on the way out.   If you have mychart we'll likely use that to update you.    Gradually increase gabapentin as tolerated.  We'll call about PT.   Take care.  Glad to see you.

## 2020-07-24 ENCOUNTER — Encounter: Payer: Self-pay | Admitting: Family Medicine

## 2020-07-26 NOTE — Assessment & Plan Note (Signed)
He is some better than a few days ago but still not doing well.  Symptoms not resolved.  Check plain films today.  He can increase gabapentin gradually up to 600 mg 3 times a day if needed.  Routine cautions given to patient.  His wife went to PT at [facility] and he wanted to follow-up with our PT department.  Referred.  Routine cautions given to patient.  He agrees to plan.

## 2020-07-28 ENCOUNTER — Other Ambulatory Visit: Payer: Self-pay | Admitting: Family Medicine

## 2020-08-04 ENCOUNTER — Telehealth: Payer: Self-pay | Admitting: Family Medicine

## 2020-08-04 NOTE — Progress Notes (Signed)
  Chronic Care Management   Outreach Note  08/04/2020 Name: KASHTEN GOWIN MRN: [id number] DOB: [birth year]  Referred by: Joaquim Nam, MD Reason for referral : Chronic Care Management   An unsuccessful telephone outreach was attempted today. The patient was referred to the pharmacist for assistance with care management and care coordination.   Follow Up Plan:   Aggie Hacker  Upstream Scheduler

## 2020-08-05 ENCOUNTER — Telehealth: Payer: Self-pay | Admitting: Family Medicine

## 2020-08-05 NOTE — Chronic Care Management (AMB) (Signed)
  Chronic Care Management   Note  08/05/2020 Name: [name] [name] MRN: [id number] DOB: 1941-05-05  [name] [name] is a 79 y.o. year old male who is a primary care patient of Joaquim Nam, MD. I reached out to Joselyn Glassman by phone today in response to a referral sent by [name] [name]'s PCP, Joaquim Nam, MD.   [name] [name] was given information about Chronic Care Management services today including:  1. CCM service includes personalized support from designated clinical staff supervised by his physician, including individualized plan of care and coordination with other care providers 2. 24/7 contact phone numbers for assistance for urgent and routine care needs. 3. Service will only be billed when office clinical staff spend 20 minutes or more in a month to coordinate care. 4. Only one practitioner may furnish and bill the service in a calendar month. 5. The patient may stop CCM services at any time (effective at the end of the month) by phone call to the office staff.   Patient agreed to services and verbal consent obtained.   Follow up plan:   Aggie Hacker  Upstream Scheduler

## 2020-08-12 DIAGNOSIS — M25552 Pain in left hip: Secondary | ICD-10-CM | POA: Diagnosis not present

## 2020-08-12 DIAGNOSIS — R350 Frequency of micturition: Secondary | ICD-10-CM | POA: Diagnosis not present

## 2020-08-12 DIAGNOSIS — M545 Low back pain, unspecified: Secondary | ICD-10-CM | POA: Diagnosis not present

## 2020-08-12 DIAGNOSIS — M6281 Muscle weakness (generalized): Secondary | ICD-10-CM | POA: Diagnosis not present

## 2020-09-01 NOTE — Progress Notes (Addendum)
Cardiology Office Note  Date:  09/02/2020   ID:  Singleton, Hickox [birth year], MRN [id number]  PCP:  Tonia Ghent, MD   Chief Complaint  Patient presents with  . OTher    3 month follow up. Patient c/o Hydrochlorothiazide is being his BP down. Patient has fell a few times.  Meds reviewed verbally with patient.     HPI:  [name] is a 80 year old gentleman with past medical history of Hypertension Diabetes Coronary artery disease, bypass surgery 6  2015 Ejection fraction 55% by echo July 2018 mildly dilated aortic root, PFO Previous strokes,  atrial fib, on eliquis, CHA2DS2VASc = 7);  Cardioversion 08/24/2017 admitted February 2019  placed on dofetilide and reverted back to NSR.  Who presents for follow-up of his coronary artery disease and atrial fibrillation  Last seen in clinic 10/21 by myself Seen by Dr. [name] last in April 2021  We did receive a phone call concerning high blood pressure, he was started on HCTZ November 2021 He did not do his follow-up lab work 2 weeks later  Reports he does not like HCTZ medication, "makes him feel dizzy" Has had some dizziness and gait instability which he attributes to the medication Sometimes dizziness when he is laying down, less so when he is upright but had some issues walking into the walls while ambulating in his house.  Attributes this to the HCTZ  Reports 1 concerning episode 3 weeks ago he was in the parking lot at [employer] when with minimal warning, went down, woke up looking at the sky laying in the parking lot Does not feel he was out for very long, walk to his car and drove home  Has only been off HCTZ past several days, has not noticed a significant change in his blood pressure Home blood pressure measurements typically running 621 up to 308 systolic,  No significant low numbers noted He denies any tachypalpitations  Lots of stress at home, managing several properties Neuropathy in the feet Does exercise at  the [facility] he is asymptomatic from his bradycardia  Prior records reviewed Reported having occasional breakthrough palpitations Metoprolol was held and he was started on carvedilol 6.25 twice daily Dofetilide continued  EKG personally reviewed by myself on todays visit Shows sinus bradycardia rate approximately 50 bpm, PACs noted QTC 413 ms, QRS 90 ms  Lab work reviewed  Hemoglobin A1c 6.7 down to 6.4 up to 6.6 Total cholesterol 103 LDL 51  Atrial fibrillation Early 2019, With straining, lifting, heavy lifting, had atrial fib  EKG personally reviewed by myself on todays visit Sinus brady rate 55 bpm, PACs noted  history of coronary disease and bypass 2015 Long history of Statins myalgia:   PMH:   has a past medical history of Anxiety, Back pain, Coronary artery disease (08/21/2013), Diabetes mellitus, Diabetic neuropathy ([place]), Diastolic dysfunction, Diverticulosis, Diverticulosis, Embolic stroke ([place]), Erectile dysfunction, Family history of anesthesia complication, Fatty liver, Flank pain, GERD (gastroesophageal reflux disease), History of bronchitis, History of chicken pox, HSV infection, Hypercholesterolemia, Hypertension, Kidney stones, Osteoarthritis, Persistent atrial fibrillation ([place]), and PFO (patent foramen ovale).  PSH:    Past Surgical History:  Procedure Laterality Date  . CARDIAC CATHETERIZATION  08/21/2013   DR Angelena Form  . CARDIOVERSION N/A 08/24/2017   Procedure: CARDIOVERSION;  Surgeon: Minna Merritts, MD;  Location: [facility] ORS;  Service: Cardiovascular;  Laterality: N/A;  . CHOLECYSTECTOMY  1983  . CORONARY ARTERY BYPASS GRAFT N/A 08/23/2013   Procedure: CORONARY ARTERY BYPASS  GRAFTING (CABG);  Surgeon: Grace Isaac, MD;  Location: [place];  Service: Open Heart Surgery;  Laterality: N/A;  CABG times six utilizing the left internal mammary artery and the right greater saphenous vein harvested endoscopically  . ENDOVEIN HARVEST OF GREATER SAPHENOUS VEIN  Right 08/23/2013   Procedure: ENDOVEIN HARVEST OF GREATER SAPHENOUS VEIN;  Surgeon: Grace Isaac, MD;  Location: [place];  Service: Open Heart Surgery;  Laterality: Right;  . HERNIA REPAIR     X3  . INTRAOPERATIVE TRANSESOPHAGEAL ECHOCARDIOGRAM N/A 08/23/2013   Procedure: INTRAOPERATIVE TRANSESOPHAGEAL ECHOCARDIOGRAM;  Surgeon: Grace Isaac, MD;  Location: [place];  Service: Open Heart Surgery;  Laterality: N/A;  . LEFT HEART CATHETERIZATION WITH CORONARY ANGIOGRAM N/A 08/21/2013   Procedure: LEFT HEART CATHETERIZATION WITH CORONARY ANGIOGRAM;  Surgeon: Burnell Blanks, MD;  Location: [facility] CATH LAB;  Service: Cardiovascular;  Laterality: N/A;    Current Outpatient Medications  Medication Sig Dispense Refill  . acyclovir (ZOVIRAX) 800 MG tablet Take 1 tablet (800 mg total) by mouth 2 (two) times daily. 180 tablet 3  . amLODipine (NORVASC) 10 MG tablet Take 1 tablet (10 mg total) by mouth daily. 180 tablet 3  . carvedilol (COREG) 6.25 MG tablet 1 tab by mouth twice a day.    . clotrimazole-betamethasone (LOTRISONE) cream Apply 1 application topically daily as needed. 30 g 2  . COD LIVER OIL PO Take 1 capsule by mouth 2 (two) times daily.    Marland Kitchen dofetilide (TIKOSYN) 500 MCG capsule Take 1 capsule (500 mcg total) by mouth 2 (two) times daily. 180 capsule 3  . ELIQUIS 5 MG TABS tablet TAKE 1 TABLET BY MOUTH TWICE DAILY 180 tablet 1  . ezetimibe (ZETIA) 10 MG tablet TAKE 1 TABLET BY MOUTH EVERY DAY 90 tablet 3  . gabapentin (NEURONTIN) 600 MG tablet Take 0.5-1 tablets (300-600 mg total) by mouth 3 (three) times daily as needed. 270 tablet 1  . loratadine (CLARITIN) 10 MG tablet Take 1 tablet (10 mg total) by mouth daily.    Marland Kitchen losartan (COZAAR) 100 MG tablet Take 1 tablet (100 mg total) by mouth daily. 90 tablet 1  . metFORMIN (GLUCOPHAGE) 850 MG tablet Take 1 tablet (850 mg total) by mouth 2 (two) times daily with a meal. 180 tablet 3  . nitroGLYCERIN (NITROSTAT) 0.4 MG SL tablet Place 1  tablet (0.4 mg total) under the tongue every 5 (five) minutes as needed for chest pain (max 3 doses). 25 tablet 6  . timolol (BETIMOL) 0.5 % ophthalmic solution Place 1 drop into both eyes 2 (two) times daily.    . valACYclovir (VALTREX) 1000 MG tablet Take 1,000 mg by mouth in the morning and at bedtime.      No current facility-administered medications for this visit.    Allergies:   Fenofibrate, Fire ant, and Statins   Social History:  The patient  reports that he has never smoked. He has never used smokeless tobacco. He reports that he does not drink alcohol and does not use drugs.   Family History:   family history includes CAD in his brother; Colon cancer in his maternal grandmother; Diabetes in his mother; Heart disease in his father; Parkinsonism in his maternal grandfather; Prostate cancer in his maternal uncle; Stroke in his mother and paternal grandfather.    Review of Systems: Review of Systems  Constitutional: Negative.   Respiratory: Negative.   Cardiovascular: Negative.   Gastrointestinal: Negative.   Musculoskeletal: Negative.  Leg pain, foot pain  Neurological: Positive for dizziness and loss of consciousness.  Psychiatric/Behavioral: Negative.   All other systems reviewed and are negative.   PHYSICAL EXAM: VS:  BP (!) 154/66 (BP Location: Left Arm, Patient Position: Sitting, Cuff Size: Normal)   Pulse (!) 55   Ht 5\' 7"  (1.702 m)   Wt 166 lb (75.3 kg)   SpO2 97%   BMI 26.00 kg/m  , BMI Body mass index is 26 kg/m. Constitutional:  oriented to person, place, and time. No distress.  HENT:  Head: Grossly normal Eyes:  no discharge. No scleral icterus.  Neck: No JVD, no carotid bruits  Cardiovascular: Bradycardic, normal rhythm, no murmurs appreciated Pulmonary/Chest: Clear to auscultation bilaterally, no wheezes or rails Abdominal: Soft.  no distension.  no tenderness.  Musculoskeletal: Normal range of motion Neurological:  normal muscle tone.  Coordination normal. No atrophy Skin: Skin warm and dry Psychiatric: normal affect, pleasant   Recent Labs: 10/29/2019: TSH 1.80 04/16/2020: ALT 11; BUN 15; Creatinine, Ser 0.81; Hemoglobin 15.3; Platelets 163.0; Potassium 4.2; Sodium 139   Lipid Panel Lab Results  Component Value Date   CHOL 103 10/29/2019   HDL 28.70 (L) 10/29/2019   LDLCALC 62 06/07/2017   TRIG 218.0 (H) 10/29/2019     Wt Readings from Last 3 Encounters:  09/02/20 166 lb (75.3 kg)  07/23/20 163 lb 12.8 oz (74.3 kg)  07/16/20 164 lb 6.4 oz (74.6 kg)    ASSESSMENT AND PLAN:  Persistent atrial fibrillation (HCC) Managed by Dr. [name], History of sinus bradycardia, prior visit was running in the 40s, running in the 50 range today Some near syncope, likely episode of frank syncope 3 weeks ago On Eliquis 5 twice daily -Recommend we decrease carvedilol down to 3.125 twice daily, will hold HCTZ We have recommended he wear a ZIO live monitor for 2 weeks Concern for bradycardia/sick sinus syndrome/pauses as a cause of his symptoms If confirmed on ZIO may need to hold carvedilol altogether, Will discuss with EP.  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 EKG 12-Lead    Total encounter time more than 35 minutes  Greater than 50% was spent in counseling and coordination of care with the patient   Signed, Esmond Plants, M.D., Ph.D. 09/02/2020  [place], [place]

## 2020-09-02 ENCOUNTER — Ambulatory Visit (INDEPENDENT_AMBULATORY_CARE_PROVIDER_SITE_OTHER): Payer: Medicare Other

## 2020-09-02 ENCOUNTER — Telehealth: Payer: Self-pay

## 2020-09-02 ENCOUNTER — Telehealth: Payer: Self-pay | Admitting: Cardiovascular Disease

## 2020-09-02 ENCOUNTER — Other Ambulatory Visit: Payer: Self-pay

## 2020-09-02 ENCOUNTER — Encounter: Payer: Self-pay | Admitting: Cardiovascular Disease

## 2020-09-02 ENCOUNTER — Ambulatory Visit (INDEPENDENT_AMBULATORY_CARE_PROVIDER_SITE_OTHER): Payer: Medicare Other | Admitting: Cardiovascular Disease

## 2020-09-02 ENCOUNTER — Ambulatory Visit: Payer: Medicare Other

## 2020-09-02 VITALS — BP 154/66 | HR 55 | Ht 67.0 in | Wt 166.0 lb

## 2020-09-02 DIAGNOSIS — R55 Syncope and collapse: Secondary | ICD-10-CM

## 2020-09-02 DIAGNOSIS — I4819 Other persistent atrial fibrillation: Secondary | ICD-10-CM | POA: Diagnosis not present

## 2020-09-02 DIAGNOSIS — I25118 Atherosclerotic heart disease of native coronary artery with other forms of angina pectoris: Secondary | ICD-10-CM | POA: Diagnosis not present

## 2020-09-02 DIAGNOSIS — E785 Hyperlipidemia, unspecified: Secondary | ICD-10-CM

## 2020-09-02 DIAGNOSIS — I6529 Occlusion and stenosis of unspecified carotid artery: Secondary | ICD-10-CM

## 2020-09-02 DIAGNOSIS — I10 Essential (primary) hypertension: Secondary | ICD-10-CM

## 2020-09-02 DIAGNOSIS — Z951 Presence of aortocoronary bypass graft: Secondary | ICD-10-CM

## 2020-09-02 DIAGNOSIS — M791 Myalgia, unspecified site: Secondary | ICD-10-CM

## 2020-09-02 DIAGNOSIS — I739 Peripheral vascular disease, unspecified: Secondary | ICD-10-CM

## 2020-09-02 DIAGNOSIS — I63429 Cerebral infarction due to embolism of unspecified anterior cerebral artery: Secondary | ICD-10-CM

## 2020-09-02 DIAGNOSIS — T466X5D Adverse effect of antihyperlipidemic and antiarteriosclerotic drugs, subsequent encounter: Secondary | ICD-10-CM

## 2020-09-02 DIAGNOSIS — T466X5A Adverse effect of antihyperlipidemic and antiarteriosclerotic drugs, initial encounter: Secondary | ICD-10-CM

## 2020-09-02 MED ORDER — CARVEDILOL 3.125 MG PO TABS: 3.1250 mg | ORAL_TABLET | Freq: Two times a day (BID) | ORAL | 3 refills | Status: DC

## 2020-09-02 MED ORDER — APIXABAN 5 MG PO TABS: 5.0000 mg | ORAL_TABLET | Freq: Two times a day (BID) | ORAL | 3 refills | Status: DC

## 2020-09-02 MED ORDER — LOSARTAN POTASSIUM 100 MG PO TABS: 100.0000 mg | ORAL_TABLET | Freq: Every day | ORAL | 3 refills | Status: DC

## 2020-09-02 MED ORDER — AMLODIPINE BESYLATE 10 MG PO TABS: 10.0000 mg | ORAL_TABLET | Freq: Every day | ORAL | 3 refills | Status: DC

## 2020-09-02 MED ORDER — HYDRALAZINE HCL 25 MG PO TABS: 25.0000 mg | ORAL_TABLET | Freq: Three times a day (TID) | ORAL | 3 refills | Status: DC | PRN

## 2020-09-02 MED ORDER — DOFETILIDE 500 MCG PO CAPS: 500.0000 ug | ORAL_CAPSULE | Freq: Two times a day (BID) | ORAL | 3 refills | Status: DC

## 2020-09-02 MED ORDER — EZETIMIBE 10 MG PO TABS: 10.0000 mg | ORAL_TABLET | Freq: Every day | ORAL | 3 refills | Status: DC

## 2020-09-02 NOTE — Telephone Encounter (Signed)
Requested Prescriptions   Signed Prescriptions Disp Refills  . carvedilol (COREG) 3.125 MG tablet 180 tablet 3    Sig: Take 1 tablet (3.125 mg total) by mouth 2 (two) times daily.    Authorizing Provider: Minna Merritts    Ordering User: Janan Ridge losartan (COZAAR) 100 MG tablet 90 tablet 3    Sig: Take 1 tablet (100 mg total) by mouth daily.    Authorizing Provider: Minna Merritts    Ordering User: Janan Ridge

## 2020-09-02 NOTE — Patient Instructions (Addendum)
Medication Instructions:  Stop the HCTZ  Decrease the coreg down to 3.125 mg two times a day  A new prescription was sent to pharmacy for updated dosing as well.   Hydralazine 25 mg PRN  (may take 3 times a day as needed)  For high blood pressure >150,    Lab work: No new labs needed  . Testing/Procedures: Zio monitor for syncope Heart monitor (Zio patch live monitor) for 2 weeks (14 days)   Your physician has recommended that you wear a Zio monitor. This monitor is a medical device that records the heart's electrical activity. Doctors most often use these monitors to diagnose arrhythmias. Arrhythmias are problems with the speed or rhythm of the heartbeat. The monitor is a small device applied to your chest. You can wear one while you do your normal daily activities. While wearing this monitor if you have any symptoms to push the button and record what you felt. Once you have worn this monitor for the period of time provider prescribed (Usually 14 days), you will return the monitor device in the postage paid box. Once it is returned they will download the data collected and provide [place] with a report which the provider will then review and we will call you with those results. Important tips:  Marland Kitchen Push the button when you are feeling any cardiac symptoms and record them in the logbook    1. Avoid showering during the first 24 hours of wearing the monitor. 2. Avoid excessive sweating to help maximize wear time. 3. Do not submerge the device, no hot tubs, and no swimming pools. 4. Keep any lotions or oils away from the patch. 5. After 24 hours you may shower with the patch on. Take brief showers with your back facing the shower head.  6. Do not remove patch once it has been placed because that will interrupt data and decrease adhesive wear time. 7. Push the button when you have any symptoms and write down what you were feeling. 8. Once you have completed wearing your monitor, remove and  place into box which has postage paid and place in your outgoing mailbox.  9. If for some reason you have misplaced your box then call our office and we can provide another box and/or mail it off for you.        Follow-Up: At [facility], you and your health needs are our priority.  As part of our continuing mission to provide you with exceptional heart care, we have created designated Provider Care Teams.  These Care Teams include your primary Cardiologist (physician) and Advanced Practice Providers (APPs -  Physician Assistants and Nurse Practitioners) who all work together to provide you with the care you need, when you need it.  . You will need a follow up appointment in 6 weeks-2 months  . Providers on your designated Care Team:   . Murray Hodgkins, NP . Christell Faith, PA-C . Marrianne Mood, PA-C  Any Other Special Instructions Will Be Listed Below (If Applicable).  COVID-19 Vaccine Information can be found at: ShippingScam.co.uk For questions related to vaccine distribution or appointments, please email vaccine@[email or web address] .com or call [phone].

## 2020-09-02 NOTE — Telephone Encounter (Signed)
Rx request sent to pharmacy.  

## 2020-09-02 NOTE — Telephone Encounter (Signed)
Spoke with Mr. Pracht regarding his medications to be filled,  he will call back with the medications that he would like to be sent through [facility].

## 2020-09-02 NOTE — Telephone Encounter (Signed)
*  STAT* If patient is at the pharmacy, call can be transferred to refill team.   1. Which medications need to be refilled? (please list name of each medication and dose if known) Tikosyn,  Dofetilide  2. Which pharmacy/location (including street and city if local pharmacy) is medication to be sent to? [facility]   3. Do they need a 30 day or 90 day supply? 90 patient would like for all [facility]

## 2020-09-02 NOTE — Telephone Encounter (Signed)
Requested Prescriptions   Signed Prescriptions Disp Refills   dofetilide (TIKOSYN) 500 MCG capsule 180 capsule 3    Sig: Take 1 capsule (500 mcg total) by mouth 2 (two) times daily.    Authorizing Provider: Minna Merritts    Ordering User: Janan Ridge   ezetimibe (ZETIA) 10 MG tablet 90 tablet 3    Sig: Take 1 tablet (10 mg total) by mouth daily.    Authorizing Provider: Minna Merritts    Ordering User: Janan Ridge   amLODipine (NORVASC) 10 MG tablet 180 tablet 3    Sig: Take 1 tablet (10 mg total) by mouth daily.    Authorizing Provider: Minna Merritts    Ordering User: Janan Ridge

## 2020-09-02 NOTE — Telephone Encounter (Signed)
Patient is calling to follow up regarding his medication refills. He provided the names of the medications he is needing to have sent to [facility].   *STAT* If patient is at the pharmacy, call can be transferred to refill team.   1. Which medications need to be refilled? (please list name of each medication and dose if known)  Losartan (COZAAR) 100 MG tablet Eliquis 5 MG tablet Carvedilol (COREG) 3.125 MG tablet  2. Which pharmacy/location (including street and city if local pharmacy) is medication to be sent to? [facility] [place], [place]  3. Do they need a 30 day or 90 day supply? 90 day supply

## 2020-09-02 NOTE — Addendum Note (Signed)
Addended by: Ernie Hew D on: 09/02/2020 02:22 PM   Modules accepted: Orders

## 2020-09-02 NOTE — Telephone Encounter (Signed)
Requested Prescriptions   Signed Prescriptions Disp Refills   dofetilide (TIKOSYN) 500 MCG capsule 180 capsule 3    Sig: Take 1 capsule (500 mcg total) by mouth 2 (two) times daily.    Authorizing Provider: GOLLAN, TIMOTHY J    Ordering User: Zainab Crumrine N   ezetimibe (ZETIA) 10 MG tablet 90 tablet 3    Sig: Take 1 tablet (10 mg total) by mouth daily.    Authorizing Provider: GOLLAN, TIMOTHY J    Ordering User: Lexys Milliner N   amLODipine (NORVASC) 10 MG tablet 180 tablet 3    Sig: Take 1 tablet (10 mg total) by mouth daily.    Authorizing Provider: GOLLAN, TIMOTHY J    Ordering User: Acie Custis N   

## 2020-09-02 NOTE — Telephone Encounter (Signed)
This is a [place] pt 

## 2020-09-02 NOTE — Telephone Encounter (Signed)
Patient in office States that all prescriptions will now being going through [facility]  Please update

## 2020-09-02 NOTE — Telephone Encounter (Signed)
Patient calling in stating he is going to pick up medications at [facility] since they are already there. Everything else from now on should go to [facility]

## 2020-09-04 DIAGNOSIS — R55 Syncope and collapse: Secondary | ICD-10-CM | POA: Diagnosis not present

## 2020-09-04 DIAGNOSIS — I4819 Other persistent atrial fibrillation: Secondary | ICD-10-CM | POA: Diagnosis not present

## 2020-09-07 ENCOUNTER — Telehealth: Payer: Self-pay | Admitting: Family Medicine

## 2020-09-07 ENCOUNTER — Telehealth: Payer: Self-pay | Admitting: Internal Medicine

## 2020-09-07 MED ORDER — DOFETILIDE 500 MCG PO CAPS: 500.0000 ug | ORAL_CAPSULE | Freq: Two times a day (BID) | ORAL | 1 refills | Status: DC

## 2020-09-07 NOTE — Telephone Encounter (Addendum)
All of the patients cardiac medications (including dofetilide)  were refilled on 09/02/20 [facility].  Dofetilide refilled and sent to the [facility] in [place] as the patient requested.  lmom with the above info. Patient is to contact the office if any questions.

## 2020-09-07 NOTE — Telephone Encounter (Signed)
New Message:    Pt said he wanted you to know all of his medicine go to [facility] except Dofetilide. Dofetilide goes to [facility] in [place]

## 2020-09-07 NOTE — Telephone Encounter (Signed)
Please change the pharmacy of his choice to  [facility]. EM

## 2020-09-07 NOTE — Telephone Encounter (Signed)
Pharmacy has been updated.

## 2020-09-11 ENCOUNTER — Telehealth: Payer: Self-pay | Admitting: Student

## 2020-09-11 NOTE — Telephone Encounter (Signed)
   Receive page from [facility] after hours about an abnormal EKG. Called and spoke with staff. Patient had first occurrence of atrial fibrillation, rate 73bpm, around 7:00pm that last for about 75 seconds. This was an automatic trigger. No symptoms reported. Patient has known atrial fibrillation and is on Eliquis. No changes to medications or plan necessary at this time.  FELIZ LINCOLN, PA-C 09/11/2020 9:01 PM

## 2020-09-13 NOTE — Progress Notes (Signed)
Can we hold his coreg per attached note from Dr. [name] Thx TG

## 2020-09-14 ENCOUNTER — Telehealth: Payer: Self-pay

## 2020-09-14 NOTE — Telephone Encounter (Signed)
Left detail message on pt's VM (DPR approved). Dr. Rockey Situ advised after speaking with Dr. [name] that pt d/c his carvedilol 3.125mg  at this time. For any questions or concerns, pt may call back for further details.

## 2020-09-16 ENCOUNTER — Telehealth: Payer: Self-pay | Admitting: Cardiovascular Disease

## 2020-09-16 DIAGNOSIS — I4819 Other persistent atrial fibrillation: Secondary | ICD-10-CM | POA: Diagnosis not present

## 2020-09-16 DIAGNOSIS — R55 Syncope and collapse: Secondary | ICD-10-CM

## 2020-09-16 NOTE — Telephone Encounter (Signed)
Patient calling  States that hydralazine medication needs to be resubmitted and approved for home delivery  Fax number is [phone]

## 2020-09-16 NOTE — Telephone Encounter (Signed)
[place], Education administrator, who states they did not receive the Rx for hydralazine sent on 09/02/2020 despite receipt confirmation. Confirmed Rx verbally. Technician will have Rx sent out to patient.

## 2020-09-17 ENCOUNTER — Telehealth: Payer: Self-pay

## 2020-09-17 NOTE — Chronic Care Management (AMB) (Addendum)
Chronic Care Management Pharmacy Assistant   Name: [name] [name]  MRN: [id number] DOB: May 15, 1941  Reason for Encounter: Initial questions for CCM visit scheduled 09/23/20  PCP : [name] Ghent, MD  Allergies:   Allergies  Allergen Reactions   Fenofibrate Other (See Comments)    Muscle weakness/pain   Fire Ant     Allergic reaction   Statins Other (See Comments)    MYALGIA, muscle pain    Medications: Outpatient Encounter Medications as of 09/17/2020  Medication Sig   acyclovir (ZOVIRAX) 800 MG tablet Take 1 tablet (800 mg total) by mouth 2 (two) times daily.   amLODipine (NORVASC) 10 MG tablet Take 1 tablet (10 mg total) by mouth daily.   apixaban (ELIQUIS) 5 MG TABS tablet Take 1 tablet (5 mg total) by mouth 2 (two) times daily.   clotrimazole-betamethasone (LOTRISONE) cream Apply 1 application topically daily as needed.   COD LIVER OIL PO Take 1 capsule by mouth 2 (two) times daily.   dofetilide (TIKOSYN) 500 MCG capsule Take 1 capsule (500 mcg total) by mouth 2 (two) times daily.   ezetimibe (ZETIA) 10 MG tablet Take 1 tablet (10 mg total) by mouth daily.   gabapentin (NEURONTIN) 600 MG tablet Take 0.5-1 tablets (300-600 mg total) by mouth 3 (three) times daily as needed.   hydrALAZINE (APRESOLINE) 25 MG tablet Take 1 tablet (25 mg total) by mouth 3 (three) times daily as needed. Take for BP >150   loratadine (CLARITIN) 10 MG tablet Take 1 tablet (10 mg total) by mouth daily.   losartan (COZAAR) 100 MG tablet Take 1 tablet (100 mg total) by mouth daily.   metFORMIN (GLUCOPHAGE) 850 MG tablet Take 1 tablet (850 mg total) by mouth 2 (two) times daily with a meal.   nitroGLYCERIN (NITROSTAT) 0.4 MG SL tablet Place 1 tablet (0.4 mg total) under the tongue every 5 (five) minutes as needed for chest pain (max 3 doses).   timolol (BETIMOL) 0.5 % ophthalmic solution Place 1 drop into both eyes 2 (two) times daily.   valACYclovir (VALTREX) 1000 MG tablet Take 1,000 mg by mouth in  the morning and at bedtime.    No facility-administered encounter medications on file as of 09/17/2020.    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   Vertigo 03/04/2019   Hand abrasion 10/28/2018   Snoring 10/14/2018   Tongue abnormality 10/14/2018   Persistent atrial fibrillation ([place]) 09/18/2017   Hearing loss 08/27/2017   Glaucoma 07/23/2017   Stroke ([place]) 04/11/2017   Myopathy 03/23/2017   Weakness 01/18/2017   Arm pain 08/05/2016   Prostate cancer screening 08/04/2016   HTN (hypertension) 08/04/2016   Rectal pain 02/18/2016   Plantar fasciitis 02/18/2016   AK (actinic keratosis) 02/02/2016   Anxiety state 02/19/2015   Exertional chest pain 02/19/2015   Advance care planning 07/15/2014   Atrial fibrillation ([place]) 08/28/2013   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 Yes  09/02/20 Dr. Ida [name]- Cardiology  Any changes in your medications or health? Yes  09/02/20 Dr. Rockey [name] started patient on hydralazine 25 mg TID as needed.   Any side effects from any medications? Yes- just started and thinks he is having some side effects. Not sure of the name of medication.   Do you have an symptoms or problems not managed by your medications? No   Any concerns about your health right now? No  Has your provider asked that you check blood pressure, blood sugar, or follow special diet at home? Yes- checks blood  pressure  Do you get any type of exercise on a regular basis? 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  Can you think of a goal you would like to reach for your health? No  Do you have any problems getting your medications? No Patient's preferred pharmacy is:  [place], [place] [address] [place] [address] Phone: [phone] Fax: [place], [place] [address] [place] [address] Phone: [phone] Fax: [phone]   Is there anything that you would like to discuss during the appointment? Yes- possible side effects to new blood pressure medication.   [name] [name] was reminded to have all medications, supplements and any blood glucose and blood pressure readings available for review with [name] [name], Pharm. D, at his telephone visit on 09/23/20 at 8:30 AM .   Follow-Up:  Pharmacist Review  [name] [name], CPP notified  [name] [name], [place] Assistant [phone]  I have reviewed the care management and care coordination activities outlined in this encounter and I am certifying that I agree with the content of this note. Discuss at initial visit.  [name] [name], PharmD Clinical Pharmacist [facility] [phone]

## 2020-09-23 ENCOUNTER — Telehealth: Payer: Self-pay

## 2020-09-23 ENCOUNTER — Ambulatory Visit: Payer: Medicare Other

## 2020-09-23 ENCOUNTER — Other Ambulatory Visit: Payer: Self-pay

## 2020-09-23 DIAGNOSIS — E7800 Pure hypercholesterolemia, unspecified: Secondary | ICD-10-CM

## 2020-09-23 DIAGNOSIS — E1149 Type 2 diabetes mellitus with other diabetic neurological complication: Secondary | ICD-10-CM

## 2020-09-23 DIAGNOSIS — I4891 Unspecified atrial fibrillation: Secondary | ICD-10-CM

## 2020-09-23 DIAGNOSIS — I10 Essential (primary) hypertension: Secondary | ICD-10-CM

## 2020-09-23 MED ORDER — ACYCLOVIR 800 MG PO TABS: 800.0000 mg | ORAL_TABLET | Freq: Two times a day (BID) | ORAL | Status: DC

## 2020-09-23 MED ORDER — VALACYCLOVIR HCL 1 G PO TABS: 1000.0000 mg | ORAL_TABLET | Freq: Two times a day (BID) | ORAL | 1 refills | Status: DC | PRN

## 2020-09-23 NOTE — Progress Notes (Signed)
Chronic Care Management Pharmacy Note  09/23/2020 Name:  [name] [name] MRN:  [id number] DOB:  1940/12/10  Subjective: [name] [name] is an 80 y.o. year old male who is a primary patient of Damita Dunnings, Elveria Rising, MD.  The CCM team was consulted for assistance with disease management and care coordination needs.    Engaged with patient by telephone for initial visit in response to provider referral for pharmacy case management and/or care coordination services.   Consent to Services:  The patient was given the following information about Chronic Care Management services today, agreed to services, and gave verbal consent: 1. CCM service includes personalized support from designated clinical staff supervised by the primary care provider, including individualized plan of care and coordination with other care providers 2. 24/7 contact phone numbers for assistance for urgent and routine care needs. 3. Service will only be billed when office clinical staff spend 20 minutes or more in a month to coordinate care. 4. Only one practitioner may furnish and bill the service in a calendar month. 5.The patient may stop CCM services at any time (effective at the end of the month) by phone call to the office staff. 6. The patient will be responsible for cost sharing (co-pay) of up to 20% of the service fee (after annual deductible is met). Patient agreed to services and consent obtained.  Patient Care Team: Tonia Ghent, MD as PCP - General (Family Medicine) Minna Merritts, MD as PCP - Cardiology (Cardiology) Burnell Blanks, MD as Consulting Physician (Cardiology) Clarene Critchley, MD as Consulting Physician (Ophthalmology) Sydnee Levans, MD as Consulting Physician (Dermatology) Debbora Dus, [facility] as Pharmacist (Pharmacist)  Recent office visits: 07/23/20 - PCP - L sided sciatica.  Worse a few days ago, some better today.  Still stretching and taking gabapentin at baseline. Gradually increase  gabapentin as tolerated. We'll call about PT. 04/16/20 - PCP - diarrhea, Stop the collagen supplement. If not better then cut metformin back to 1/2 pill twice a day for a few days.   Recent consult visits: 09/02/20 - Cardiology - Syncope/bradycardia, decrease carvedilol to 3.125 mg BID, hold HCTZ. Zio monitor for 2 weeks. No driving for now. Cholesterol, LDL low on Zetia. Statin intolerant. AFIB on Eliquis 5 mg BID. Neuropathy bilateral stable.  Hospital visits: None in previous 6 months  Objective:  Lab Results  Component Value Date   CREATININE 0.81 04/16/2020   BUN 15 04/16/2020   GFR 91.73 04/16/2020   GFRNONAA >60 09/28/2017   GFRAA >60 09/28/2017   NA 139 04/16/2020   K 4.2 04/16/2020   CALCIUM 9.6 04/16/2020   CO2 27 04/16/2020    Lab Results  Component Value Date/Time   HGBA1C 6.6 (A) 05/21/2020 08:39 AM   HGBA1C 6.4 04/16/2020 12:25 PM   HGBA1C 6.7 (H) 10/29/2019 07:44 AM   GFR 91.73 04/16/2020 12:25 PM   GFR 86.87 10/29/2019 07:44 AM   MICROALBUR 1.3 06/02/2014 10:30 AM    Last diabetic Eye exam:  Lab Results  Component Value Date/Time   HMDIABEYEEXA No Retinopathy 06/01/2020 12:00 AM    Last diabetic Foot exam: 11/05/19   Lab Results  Component Value Date   CHOL 103 10/29/2019   HDL 28.70 (L) 10/29/2019   LDLCALC 62 06/07/2017   LDLDIRECT 51.0 10/29/2019   TRIG 218.0 (H) 10/29/2019   CHOLHDL 4 10/29/2019    Hepatic Function Latest Ref Rng & Units 04/16/2020 10/29/2019 09/13/2018  Total Protein 6.0 - 8.3 g/dL 6.9 6.8  6.6  Albumin 3.5 - 5.2 g/dL 4.5 4.7 4.4  AST 0 - 37 U/L _0 ALT 0 - 53 U/L _1 Alk Phosphatase 39 - 117 U/L 72 72 82  Total Bilirubin 0.2 - 1.2 mg/dL 1.1 1.6(H) 1.2  Bilirubin, Direct 0.0 - 0.3 mg/dL - - -    Lab Results  Component Value Date/Time   TSH 1.80 10/29/2019 07:44 AM   TSH 1.65 09/13/2018 08:11 AM    CBC Latest Ref Rng & Units 04/16/2020 10/29/2019 09/13/2018  WBC 4.0 - 10.5 K/uL 6.8 7.4 6.2  Hemoglobin 13.0 - 17.0  g/dL 15.3 15.5 15.0  Hematocrit 39.0 - 52.0 % 45.4 45.4 43.3  Platelets 150.0 - 400.0 K/uL 163.0 165.0 166.0    Lab Results  Component Value Date/Time   VD25OH 35.67 01/18/2017 01:16 PM   Clinical ASCVD: Yes  The ASCVD Risk score Mikey Bussing DC Jr., et al., 2013) failed to calculate for the following reasons:   The 2013 ASCVD risk score is only valid for ages 2 to 47   The patient has a prior MI or stroke diagnosis    Depression screen [facility] 2/9 07/23/2020 07/16/2020 08/16/2017  Decreased Interest 0 0 0  Down, Depressed, Hopeless 0 0 0  PHQ - 2 Score 0 0 0  Altered sleeping - - 0  Tired, decreased energy - - 0  Change in appetite - - 0  Feeling bad or failure about yourself  - - 0  Trouble concentrating - - 0  Moving slowly or fidgety/restless - - 0  Suicidal thoughts - - 0  PHQ-9 Score - - 0  Difficult doing work/chores - - Not difficult at all  Some recent data might be hidden    Social History   Tobacco Use  Smoking Status Never Smoker  Smokeless Tobacco Never Used   BP Readings from Last 3 Encounters:  09/02/20 (!) 154/66  07/23/20 (!) 150/60  07/16/20 (!) 146/64   Pulse Readings from Last 3 Encounters:  09/02/20 (!) 55  07/23/20 60  07/16/20 (!) 57   Wt Readings from Last 3 Encounters:  09/02/20 166 lb (75.3 kg)  07/23/20 163 lb 12.8 oz (74.3 kg)  07/16/20 164 lb 6.4 oz (74.6 kg)    Assessment/Interventions: Review of patient past medical history, allergies, medications, health status, including review of consultants reports, laboratory and other test data, was performed as part of comprehensive evaluation and provision of chronic care management services.   SDOH:  (Social Determinants of Health) assessments and interventions performed: Yes SDOH Interventions   Flowsheet Row Most Recent Value  SDOH Interventions   Financial Strain Interventions Intervention Not Indicated  [Medications affordable]      CCM Care Plan  Allergies  Allergen Reactions  .  Fenofibrate Other (See Comments)    Muscle weakness/pain  . [employer]     Allergic reaction  . Statins Other (See Comments)    MYALGIA, muscle pain    Medications Reviewed Today    Reviewed by Debbora Dus, [facility] (Pharmacist) on 09/23/20 at [phone]  Med List Status: <None>  Medication Order Taking? Sig Documenting Provider Last Dose Status Informant  acyclovir (ZOVIRAX) 800 MG tablet [id number] Yes Take 1 tablet (800 mg total) by mouth 2 (two) times daily. Tonia Ghent, MD Taking Active   amLODipine (NORVASC) 10 MG tablet [id number] Yes Take 1 tablet (10 mg total) by mouth daily. Minna Merritts, MD Taking Active   apixaban (ELIQUIS) 5 MG  TABS tablet [id number] Yes Take 1 tablet (5 mg total) by mouth 2 (two) times daily. Minna Merritts, MD Taking Active   clotrimazole-betamethasone Donalynn Furlong) cream [id number] Yes Apply 1 application topically daily as needed. Tonia Ghent, MD Taking Active   COD LIVER OIL PO [id number] Yes Take 1 capsule by mouth 2 (two) times daily. 10 mcg [provider] Taking Active Self  dofetilide (TIKOSYN) 500 MCG capsule [id number] Yes Take 1 capsule (500 mcg total) by mouth 2 (two) times daily. Minna Merritts, MD Taking Active   ezetimibe (ZETIA) 10 MG tablet [id number] Yes Take 1 tablet (10 mg total) by mouth daily. Minna Merritts, MD Taking Active   gabapentin (NEURONTIN) 600 MG tablet [id number] Yes Take 0.5-1 tablets (300-600 mg total) by mouth 3 (three) times daily as needed. Tonia Ghent, MD Taking Active   hydrALAZINE (APRESOLINE) 25 MG tablet [id number] Yes Take 1 tablet (25 mg total) by mouth 3 (three) times daily as needed. Take for BP >150 Minna Merritts, MD Taking Active   loratadine (CLARITIN) 10 MG tablet [id number] Yes Take 1 tablet (10 mg total) by mouth daily. Tonia Ghent, MD Taking Active   losartan (COZAAR) 100 MG tablet [id number] Yes Take 1 tablet (100 mg total) by mouth daily. Minna Merritts, MD Taking Active    metFORMIN (GLUCOPHAGE) 850 MG tablet [id number] Yes Take 1 tablet (850 mg total) by mouth 2 (two) times daily with a meal. Tonia Ghent, MD Taking Active   nitroGLYCERIN (NITROSTAT) 0.4 MG SL tablet [id number] Yes Place 1 tablet (0.4 mg total) under the tongue every 5 (five) minutes as needed for chest pain (max 3 doses). Tonia Ghent, MD Taking Active Spouse/Significant Other  Omega-3 Fatty Acids (SALMON OIL PO) [id number] Yes Take 1,000 mg by mouth. 1 twice daily [provider] Taking Active Self  timolol (BETIMOL) 0.5 % ophthalmic solution [id number] Yes Place 1 drop into both eyes 2 (two) times daily. [provider] Taking Active   valACYclovir (VALTREX) 1000 MG tablet [id number] Yes Take 1,000 mg by mouth in the morning and at bedtime.  [provider] Taking Active           Patient Active Problem List   Diagnosis Date Noted  . Sciatica 07/08/2020  . Diarrhea 04/19/2020  . Hand laceration 12/29/2019  . Skin lesion 12/29/2019  . Allergy to ant bite 11/27/2019  . Vertigo 03/04/2019  . Hand abrasion 10/28/2018  . Snoring 10/14/2018  . Tongue abnormality 10/14/2018  . Persistent atrial fibrillation ([place]) 09/18/2017  . Hearing loss 08/27/2017  . Glaucoma 07/23/2017  . Stroke ([place]) 04/11/2017  . Myopathy 03/23/2017  . Weakness 01/18/2017  . Arm pain 08/05/2016  . Prostate cancer screening 08/04/2016  . HTN (hypertension) 08/04/2016  . Rectal pain 02/18/2016  . Plantar fasciitis 02/18/2016  . AK (actinic keratosis) 02/02/2016  . Anxiety state 02/19/2015  . Exertional chest pain 02/19/2015  . Advance care planning 07/15/2014  . Atrial fibrillation ([place]) 08/28/2013  . 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 Unstable angina ([place]) 08/21/2013  . 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 Lower abdominal pain 04/24/2013  . Osteoporosis 06/16/2012  . Medicare annual wellness visit, subsequent 06/12/2012  . 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  Neck pain 12/12/2011  . Chest pain 11/29/2010  . Hypercholesteremia 11/29/2010  . Murmur 11/29/2010    Immunization History  Administered Date(s) Administered  . Fluad Quad(high Dose 65+) 05/21/2020  .  Influenza Split 06/11/2012, 06/08/2014  . Influenza,inj,Quad PF,6+ Mos 07/24/2015, 05/31/2018  . Influenza-Unspecified 05/08/2016, 05/19/2017  . Moderna Sars-Covid-2 Vaccination 08/19/2019, 09/17/2019  . Pneumococcal Conjugate-13 07/14/2014  . Pneumococcal Polysaccharide-23 06/11/2012  . Td 08/08/2008  . Tdap 10/26/2018    Conditions to be addressed/monitored:  Hypertension, Hyperlipidemia, Diabetes, Atrial Fibrillation, Coronary Artery Disease and Osteoporosis  Care Plan : [place]  Updates made by Debbora Dus, [place] since 09/23/2020 12:00 AM    Problem: Hypertension, Hyperlipidemia, Diabetes, Atrial Fibrillation, Coronary Artery Disease and Osteoporosis   Priority: High  Onset Date: 09/23/2020  Note:   Current Barriers:  . No current barriers identified.  Pharmacist Clinical Goal(s):  Marland Kitchen Over the next 30 days, patient will achieve control of blood pressure as evidenced by home readings within 140/90 . maintain control of diabetes as evidenced by A1c < 7%  through collaboration with PharmD and provider.   Interventions: . 1:1 collaboration with Tonia Ghent, MD regarding development and update of comprehensive plan of care as evidenced by provider attestation and co-signature . Inter-disciplinary care team collaboration (see longitudinal plan of care) . Comprehensive medication review performed; medication list updated in electronic medical record  Hypertension (BP goal <140/90) Query -controlled - managed by cardiology -Current treatment: . Amlodipine 10 mg - 1 tablet daily . Hydralazine 25 mg - 1 tablet TID as needed for BP > 150 . Losartan 100 mg - 1 tablet daily  -Medications previously tried: carvedilol, HCTZ - dizziness with both  -Current home  readings: none reported, but often > 150 with need to take hydralazine   Adherence: Monitoring BP a couple times a day. Concerned because his blood pressure monitor at home is running 20 points higher than doctors office. He has taken it to the office and compared it with the nurses and still 20 points off. Reports he has discussed this with his doctors. If his home BP is > 150-160, he will take 1 tablet hydralazine up to TID. He doesn't see this as too burdensome and is happy with current regimen. Patient concerns: Reports he feels a little bit dizzy, imbalanced since starting amlodipine. He is monitoring these symptoms and will let his cardiologist know if it doesn't improve.  -Educated on BP goal of < 140/90 and benefits of medications for prevention of heart attack, stroke and kidney damage -Counseled to continue monitor BP at home twice daily, document, and provide log at future appointments -Recommended to continue current medication; Periodic CMA review of home BP readings.   Hyperlipidemia: (LDL goal < 70) -controlled -Current treatment: . Zetia 10 mg - 1 tablet daily  . Cod Liver 10 mcg - 1 capsule twice daily  . Salmon oil 1000 mg - 1 capsule twice daily -Medications previously tried: statin, fenofibrate  -Educated on Cholesterol goals; LDL within goal of < 70 -Recommended to continue current medication  Diabetes (A1c goal <7%) -controlled -Current medications: Marland Kitchen Metformin 850 mg - 1 tablet twice daily with a meal  -Medications previously tried: loose stools on metformin 1000 mg BID -Current home glucose readings - none, does not have a monitor, declines interest  -Denies hypoglycemic/hyperglycemic symptoms in > 1 year  -Current meal patterns: avoids sweets, watches what he eats, loves salt and vinegar chips, has them about twice a week. Reports this is his only bad food. Eats a lot of fruits and vegetables and limits meats.  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 Rate control: none  . Rhythm: Tikosyn 500 mcg - 1 capsule twice daily  . Anticoagulation: Eliquis 5 mg - 1 tablet twice daily  -Medications previously tried: carvedilol  -Home BP and HR readings: none reported, checking twice daily -Counseled on increased risk of stroke due to Afib and benefits of anticoagulation for stroke prevention; importance of adherence to anticoagulant exactly as prescribed; Denies NSAID use including aspirin, Aleve, or Advil.  -Recommended to continue current medication  Osteoporosis (Goal: Improve bone health and prevent fractures) -Last DEXA Scan: None per chart Diagnosis 2013, per Dr. Estanislado Pandy -Current treatment  . No pharmacotherapy -Medications previously tried: none   -Recommend [phone] units of vitamin D daily. Recommend 1200 mg of calcium daily from dietary and supplemental sources. Recommend weight-bearing and muscle strengthening exercises for building and maintaining bone density.  Neuropathy (Goal: Relieve symptoms) -controlled - reports symptoms controlled on PRN therapy -Current treatment  . Gabapentin 600 mg - 1/2 to 1 tablet TID PRN  . Reports taking about 1-2 pills daily asneeded, does not exceed 3 tablets. -Medications previously tried: none reported -Recommended to continue current medication  Other: Timolol 0.5% - 1 drop twice daily for glaucoma Valacyclovir 1000 mg - 1 tablet BID (would like this changed to PRN flares) Acyclovir 800 mg - 1 tablet BID (takes this one daily, switched to valacyclovir with flares) Nitroglycerin - 1 tablet PRN chest pain  (has not needed it in past year, current med is not expired) Denies over the counter supplements   Patient Goals/Self-Care Activities . Over the next 30 days, patient will:  - continue medications as prescribed, call if any concerns     Medication Assistance: None required.  Patient affirms current coverage meets needs.  Patient's preferred pharmacy is:  [facility], [place] [address] [place] [address] Phone: [phone] Fax: [facility], [place] [address] Phone: [phone] Fax: [phone]  All medications from [facility] except dofetilide from [facility] due to cost.  Uses pill box? Yes  Pt endorses compliance to medications as prescribed.  We discussed: Current pharmacy is preferred with insurance plan and patient is satisfied with pharmacy services Patient decided to: Continue current medication management strategy  Care Plan and Follow Up Patient Decision:  Patient agrees to Care Plan and Follow-up.  Plan: Telephone follow up appointment with care management team member scheduled for:  August 27, 2021 at 8:30 AM Phone Visit  Debbora Dus, PharmD Clinical Pharmacist [facility] at [facility] [phone]

## 2020-09-23 NOTE — Telephone Encounter (Signed)
He is good on all other medications. However, he states he takes the Acyclovir 800 mg - 1 tablet twice daily for prevention and switches to valacyclovir when he has a flare up and this works well. He does not take them together.   Debbora Dus, PharmD Clinical Pharmacist [facility] at [facility] [phone]

## 2020-09-23 NOTE — Telephone Encounter (Signed)
Noted.  Thanks.  I updated his med list.

## 2020-09-23 NOTE — Telephone Encounter (Signed)
Patient is wondering if his valacyclovir prescription can be changed to same dosing but PRN since he only takes it as needed. He uses [facility] for all meds now except Dofetilide.  Thanks,  Debbora Dus, PharmD Clinical Pharmacist [facility] at [facility] [phone]

## 2020-09-23 NOTE — Telephone Encounter (Signed)
I sent the prescription for Valtrex 1 pill twice a day as needed.  I sent 60 pills with one refill.  If he is only using it as needed then hopefully that will carry him for a long time.  If he is needing it more frequently, will need to change the dosing anyway.  I took acyclovir off his med list.  Most of his other medications are coming through cardiology.  I did not send those through.  If needed please let me know and I will route this to cardiology (or if you prefer please route it directly.)  Thanks.

## 2020-09-23 NOTE — Patient Instructions (Addendum)
September 23, 2020  Dear [name] [name],  It was a pleasure meeting you during our initial appointment on September 23, 2020. Below is a summary of the goals we discussed and components of chronic care management. Please contact me anytime with questions or concerns.   . Visit Information  Goals Addressed            This Visit's Progress   . Track and Manage My Blood Pressure-Hypertension       Timeframe:  Long-Range Goal Priority:  High Start Date:    09/23/20                         Expected End Date:  10/21/21                      Follow Up Date: 3 months - office contact for blood pressure log    - Continue to monitor blood pressure twice daily, taking 1 tablet hydralazine if blood pressure is above 150 up to three times daily.    Why is this important?    You won't feel high blood pressure, but it can still hurt your blood vessels.   High blood pressure can cause heart or kidney problems. It can also cause a stroke.   Making lifestyle changes like losing a little weight or eating less salt will help.   Checking your blood pressure at home and at different times of the day can help to control blood pressure.   Call the office if you cannot afford the medicine or if there are questions about it.          Patient Care Plan: CCM Pharmacy Care Plan    Problem Identified: Hypertension, Hyperlipidemia, Diabetes, Atrial Fibrillation, Coronary Artery Disease and Osteoporosis   Priority: High  Onset Date: 09/23/2020  Note:   Current Barriers:  . No current barriers identified.  Pharmacist Clinical Goal(s):  Marland Kitchen Over the next 30 days, patient will achieve control of blood pressure as evidenced by home readings within 140/90 . maintain control of diabetes as evidenced by A1c < 7%  through collaboration with PharmD and provider.   Interventions: . 1:1 collaboration with Tonia Ghent, MD regarding development and update of comprehensive plan of care as evidenced by provider  attestation and co-signature . Inter-disciplinary care team collaboration (see longitudinal plan of care) . Comprehensive medication review performed; medication list updated in electronic medical record  Hypertension (BP goal <140/90) Query -controlled - managed by cardiology -Current treatment: . Amlodipine 10 mg - 1 tablet daily . Hydralazine 25 mg - 1 tablet TID as needed for BP > 150 . Losartan 100 mg - 1 tablet daily  -Medications previously tried: carvedilol, HCTZ - dizziness with both  -Current home readings: none reported, but often > 150 with need to take hydralazine   Adherence: Monitoring BP a couple times a day. Concerned because his blood pressure monitor at home is running 20 points higher than doctors office. He has taken it to the office and compared it with the nurses and still 20 points off. Reports he has discussed this with his doctors. If his home BP is > 150-160, he will take 1 tablet hydralazine up to TID. He doesn't see this as too burdensome and is happy with current regimen. Patient concerns: Reports he feels a little bit dizzy, imbalanced since starting amlodipine. He is monitoring these symptoms and will let his cardiologist know if it doesn't  improve.  -Educated on BP goal of < 140/90 and benefits of medications for prevention of heart attack, stroke and kidney damage -Counseled to continue monitor BP at home twice daily, document, and provide log at future appointments -Recommended to continue current medication; Periodic CMA review of home BP readings.   Hyperlipidemia: (LDL goal < 70) -controlled -Current treatment: . Zetia 10 mg - 1 tablet daily  . Cod Liver 10 mcg - 1 capsule twice daily  . Salmon oil 1000 mg - 1 capsule twice daily -Medications previously tried: statin, fenofibrate  -Educated on Cholesterol goals; LDL within goal of < 70 -Recommended to continue current medication  Diabetes (A1c goal <7%) -controlled -Current medications: Marland Kitchen Metformin  850 mg - 1 tablet twice daily with a meal  -Medications previously tried: loose stools on metformin 1000 mg BID -Current home glucose readings - none, does not have a monitor, declines interest  -Denies hypoglycemic/hyperglycemic symptoms in > 1 year  -Current meal patterns: avoids sweets, watches what he eats, loves salt and vinegar chips, has them about twice a week. Reports this is his only bad food. Eats a lot of fruits and vegetables and limits meats.  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 Rate control: none  . Rhythm: Tikosyn 500 mcg - 1 capsule twice daily  . Anticoagulation: Eliquis 5 mg - 1 tablet twice daily  -Medications previously tried: carvedilol  -Home BP and HR readings: none reported, checking twice daily -Counseled on increased risk of stroke due to Afib and benefits of anticoagulation for stroke prevention; importance of adherence to anticoagulant exactly as prescribed; Denies NSAID use including aspirin, Aleve, or Advil.  -Recommended to continue current medication  Osteoporosis (Goal: Improve bone health and prevent fractures) -Last DEXA Scan: None per chart Diagnosis 2013, per Dr. Estanislado Pandy -Current treatment  . No pharmacotherapy -Medications previously tried: none   -Recommend [phone] units of vitamin D daily. Recommend 1200 mg of calcium daily from dietary and supplemental sources. Recommend weight-bearing and muscle strengthening exercises for building and maintaining bone density.  Neuropathy (Goal: Relieve symptoms) -controlled - reports symptoms controlled  on PRN therapy -Current treatment  . Gabapentin 600 mg - 1/2 to 1 tablet TID PRN  . Reports taking about 1-2 pills daily asneeded, does not exceed 3 tablets. -Medications previously tried: none reported -Recommended to continue current medication  Other: Timolol 0.5% - 1 drop twice daily for glaucoma Valacyclovir 1000 mg - 1 tablet BID (would like this changed to PRN flares) Acyclovir 800 mg - 1 tablet BID (takes this one daily, switched to valacyclovir with flares) Nitroglycerin - 1 tablet PRN chest pain (has not needed it in past year, current med is not expired) Denies over the counter supplements   Patient Goals/Self-Care Activities . Over the next 30 days, patient will:  - continue medications as prescribed, call if any concerns    [name] [name] was given information about Chronic Care Management services today including:  1. CCM service includes personalized support from designated clinical staff supervised by his physician, including individualized plan of care and coordination with other care providers 2. 24/7 contact phone numbers for assistance for  urgent and routine care needs. 3. Standard insurance, coinsurance, copays and deductibles apply for chronic care management only during months in which we provide at least 20 minutes of these services. Most insurances cover these services at 100%, however patients may be responsible for any copay, coinsurance and/or deductible if applicable. This service may help you avoid the need for more expensive face-to-face services. 4. Only one practitioner may furnish and bill the service in a calendar month. 5. The patient may stop CCM services at any time (effective at the end of the month) by phone call to the office staff.  Patient agreed to services and verbal consent obtained on 08/04/20.  Patient verbalizes understanding of instructions provided today and agrees to view in [place].  Telephone follow up appointment with pharmacy team member  scheduled for:  August 27, 2021 at 8:30 AM Phone Visit  Debbora Dus, PharmD Clinical Pharmacist [facility] [phone]    [place] stands for Dietary Approaches to Stop Hypertension. The DASH eating plan is a healthy eating plan that has been shown to:  Reduce high blood pressure (hypertension).  Reduce your risk for type 2 diabetes, heart disease, and stroke.  Help with weight loss. What are tips for following this plan? Reading food labels  Check food labels for the amount of salt (sodium) per serving. Choose foods with less than 5 percent of the Daily Value of sodium. Generally, foods with less than 300 milligrams (mg) of sodium per serving fit into this eating plan.  To find whole grains, look for the word "whole" as the first word in the ingredient list. Shopping  Buy products labeled as "low-sodium" or "no salt added."  Buy fresh foods. Avoid canned foods and pre-made or frozen meals. Cooking  Avoid adding salt when cooking. Use salt-free seasonings or herbs instead of table salt or sea salt. Check with your health care provider or pharmacist before using salt substitutes.  Do not fry foods. Cook foods using healthy methods such as baking, boiling, grilling, roasting, and broiling instead.  Cook with heart-healthy oils, such as olive, canola, avocado, soybean, or sunflower oil. Meal planning  Eat a balanced diet that includes: ? 4 or more servings of fruits and 4 or more servings of vegetables each day. Try to fill one-half of your plate with fruits and vegetables. ? 6-8 servings of whole grains each day. ? Less than 6 oz (170 g) of lean meat, poultry, or fish each day. A 3-oz (85-g) serving of meat is about the same size as a deck of cards. One egg equals 1 oz (28 g). ? 2-3 servings of low-fat dairy each day. One serving is 1 cup (237 mL). ? 1 serving of nuts, seeds, or beans 5 times each week. ? 2-3 servings of  heart-healthy fats. Healthy fats called omega-3 fatty acids are found in foods such as walnuts, flaxseeds, fortified milks, and eggs. These fats are also found in cold-water fish, such as sardines, salmon, and mackerel.  Limit how much you eat of: ? Canned or prepackaged foods. ? Food that is high in trans fat, such as some fried foods. ? Food that is high in saturated fat, such as fatty meat. ? Desserts and other sweets, sugary drinks, and other foods with added sugar. ? Full-fat dairy products.  Do not salt foods before eating.  Do not eat more than 4 egg yolks a week.  Try to eat at least 2 vegetarian meals a week.  Eat more home-cooked food and less restaurant, buffet, and fast food.   Lifestyle  When eating at a restaurant, ask that your food be prepared with less salt or no salt, if possible.  If you drink alcohol: ? Limit how much you use to:  0-1 drink a day for women who are not pregnant.  0-2 drinks a day for men. ? Be aware of how much alcohol is in your drink. In the [place] one drink equals one 12 oz bottle of beer (355 mL), one 5 oz glass of wine (148 mL), or one 1 oz glass of hard liquor (44 mL). General information  Avoid eating more than 2,300 mg of salt a day. If you have hypertension, you may need to reduce your sodium intake to 1,500 mg a day.  Work with your health care provider to maintain a healthy body weight or to lose weight. Ask what an ideal weight is for you.  Get at least 30 minutes of exercise that causes your heart to beat faster (aerobic exercise) most days of the week. Activities may include walking, swimming, or biking.  Work with your health care provider or dietitian to adjust your eating plan to your individual calorie needs. What foods should I eat? Fruits All fresh, dried, or frozen fruit. Canned fruit in natural juice (without added sugar). Vegetables Fresh or frozen vegetables (raw, steamed, roasted, or grilled). Low-sodium or  reduced-sodium tomato and vegetable juice. Low-sodium or reduced-sodium tomato sauce and tomato paste. Low-sodium or reduced-sodium canned vegetables. Grains Whole-grain or whole-wheat bread. Whole-grain or whole-wheat pasta. Brown rice. [place]. Bulgur. Whole-grain and low-sodium cereals. Pita bread. Low-fat, low-sodium crackers. Whole-wheat flour tortillas. Meats and other proteins Skinless chicken or [place]. Ground chicken or [place]. Pork with fat trimmed off. Fish and seafood. Egg whites. Dried beans, peas, or lentils. Unsalted nuts, nut butters, and seeds. Unsalted canned beans. Lean cuts of beef with fat trimmed off. Low-sodium, lean precooked or cured meat, such as sausages or meat loaves. Dairy Low-fat (1%) or fat-free (skim) milk. Reduced-fat, low-fat, or fat-free cheeses. Nonfat, low-sodium ricotta or cottage cheese. Low-fat or nonfat yogurt. Low-fat, low-sodium cheese. Fats and oils Soft margarine without trans fats. Vegetable oil. Reduced-fat, low-fat, or light mayonnaise and salad dressings (reduced-sodium). Canola, safflower, olive, avocado, soybean, and sunflower oils. Avocado. Seasonings and condiments Herbs. Spices. Seasoning mixes without salt. Other foods Unsalted popcorn and pretzels. Fat-free sweets. The items listed above may not be a complete list of foods and beverages you can eat. Contact a dietitian for more information. What foods should I avoid? Fruits Canned fruit in a light or heavy syrup. Fried fruit. Fruit in cream or butter sauce. Vegetables Creamed or fried vegetables. Vegetables in a cheese sauce. Regular canned vegetables (not low-sodium or reduced-sodium). Regular canned tomato sauce and paste (not low-sodium or reduced-sodium). Regular tomato and vegetable juice (not low-sodium or reduced-sodium). Angie Fava. Olives. Grains Baked goods made with fat, such as croissants, muffins, or some breads. Dry pasta or rice meal packs. Meats and other  proteins Fatty cuts of meat. Ribs. Fried meat. Berniece Salines. Bologna, salami, and other precooked or cured meats, such as sausages or meat loaves. Fat from the back of a pig (fatback). Bratwurst. Salted nuts and seeds. Canned beans with added salt. Canned or smoked fish. Whole eggs or egg yolks. Chicken or [place] with skin. Dairy Whole or 2% milk, cream, and half-and-half. Whole or full-fat cream cheese. Whole-fat or sweetened yogurt. Full-fat cheese. Nondairy creamers. Whipped toppings. Processed cheese and  cheese spreads. Fats and oils Butter. Stick margarine. Lard. Shortening. Ghee. Bacon fat. Tropical oils, such as coconut, palm kernel, or palm oil. Seasonings and condiments Onion salt, garlic salt, seasoned salt, table salt, and sea salt. Worcestershire sauce. Tartar sauce. Barbecue sauce. Teriyaki sauce. Soy sauce, including reduced-sodium. Steak sauce. Canned and packaged gravies. Fish sauce. Oyster sauce. Cocktail sauce. Store-bought horseradish. Ketchup. Mustard. Meat flavorings and tenderizers. Bouillon cubes. Hot sauces. Pre-made or packaged marinades. Pre-made or packaged taco seasonings. Relishes. Regular salad dressings. Other foods Salted popcorn and pretzels. The items listed above may not be a complete list of foods and beverages you should avoid. Contact a dietitian for more information. Where to find more information  National Heart, Lung, and Blood Institute: https://wilson-eaton.com/  American Heart Association: www.heart.org  Academy of Nutrition and Dietetics: www.eatright.[place]: www.kidney.org Summary  The DASH eating plan is a healthy eating plan that has been shown to reduce high blood pressure (hypertension). It may also reduce your risk for type 2 diabetes, heart disease, and stroke.  When on the DASH eating plan, aim to eat more fresh fruits and vegetables, whole grains, lean proteins, low-fat dairy, and heart-healthy fats.  With the DASH eating plan,  you should limit salt (sodium) intake to 2,300 mg a day. If you have hypertension, you may need to reduce your sodium intake to 1,500 mg a day.  Work with your health care provider or dietitian to adjust your eating plan to your individual calorie needs. This information is not intended to replace advice given to you by your health care provider. Make sure you discuss any questions you have with your health care provider. Document Revised: 06/28/2019 Document Reviewed: 06/28/2019 Elsevier Patient Education  2021 [employer].

## 2020-10-05 ENCOUNTER — Telehealth: Payer: Self-pay

## 2020-10-05 NOTE — Telephone Encounter (Signed)
Attempted to reach pt, LDM on phone, okay by DPR, reviewed pt's Zio results "Several different kinds of rhythm noted  Would suggest we arrange appt with EP/[facility]bert for junctional rhythm, pauses  Recent syncope  Uncertain if pacer is indicated" Dr. Rockey Situ recommendations of f/u appt with EP provider, secure message sent to scheduling to try and reach out to pt for f/u appt with EP

## 2020-10-06 ENCOUNTER — Telehealth: Payer: Self-pay

## 2020-10-06 ENCOUNTER — Other Ambulatory Visit: Payer: Self-pay

## 2020-10-06 DIAGNOSIS — I498 Other specified cardiac arrhythmias: Secondary | ICD-10-CM

## 2020-10-06 NOTE — Telephone Encounter (Signed)
Was able to reach pt via phone to review his results of his Zio results and his upcoming appt with Dr. Quentin Ore to discuss his abnormal heart rhythms noted on his heart monitor. Pt grateful of phone call and explanation of his results, does have an appt with Dr. Rockey Situ on 3/11 where results can be discussed further in details, pt is currently taking all cardiac meds, no changes at this time. Otherwise all questions or concerns were address and no additional concerns at this time. Agreeable to plan, will call back for anything further.

## 2020-10-15 NOTE — Progress Notes (Signed)
Cardiology Office Note  Date:  10/16/2020   ID:  [name] [name], DOB [birth year], MRN [id number]  PCP:  Tonia Ghent, MD   Chief Complaint  Patient presents with  . 6-8 week follow up     Patient c/o decrease blood pressure with rapid iregular heart beats, pounding in chest and breaks out into sweats in the evening. Medications reviewed by the patient verbally.     HPI:  [name] [name] is a 80 year old gentleman with past medical history of Hypertension Diabetes Coronary artery disease, bypass surgery 6  2015 Ejection fraction 55% by echo July 2018 mildly dilated aortic root, PFO Previous strokes,  atrial fib, on eliquis, CHA2DS2VASc = 7);  Cardioversion 08/24/2017 admitted February 2019  placed on dofetilide and reverted back to NSR.  Who presents for follow-up of his coronary artery disease and atrial fibrillation  HCTZ started 06/2020, stopped , "made him feel dizzy" Was having spells, Sometimes dizziness when he is laying down walking into the walls while ambulating in his house.  Reported 1 concerning episode several weeks prior to his last clinic visit,  he was in the parking lot at [employer] when with minimal warning, went down, woke up looking at the sky laying in the parking lot Does not feel he was out for very long, walk to his car and drove home  09/03/19: decreased coreg to 3.125 for near syncope, syncope, dizziness, bradycardia zio ordered (as below)  Dr. [name] suggested holding coreg: Stopped coreg 09/06/20  Started hydralazine for BP >150, started BID  2 weeks ago, Stopped hydralazine Woke up at night with "boom boom boom", heart pounding Continues to have episodic tachycardia   zio monitor 09/02/20 - 09/16/2020 reviewed with him as below For last week of zio was not on carvedilol Normal sinus rhythm wih paroxysmal atrial fibrillation Patient had a min HR of 35 bpm, max HR of 211 bpm, and avg HR of 53 bpm.   1 run of Ventricular Tachycardia occurred  lasting 4 beats with a max rate of 211 bpm (avg 160  bpm).   20 Supraventricular Tachycardia runs occurred, the run with the fastest interval lasting 5 beats with a max rate of 125 bpm, the longest lasting 14.1 secs with an avg rate of 93 bpm.   Atrial Fibrillation occurred (4% burden), ranging from 37-117 bpm (avg of 64 bpm), the longest lasting 13 hours 52 mins with an avg rate of 64 bpm.   16 Pauses occurred, the longest lasting 3.6 secs (17 bpm).   Junctional Rhythm was present. Isolated SVEs were occasional (3.8%, [id number]), SVE Couplets were occasional (1.5%,  7917), and SVE Triplets were rare (<1.0%, 571).  Isolated VEs were rare (<1.0%), VE Couplets were rare (<1.0%), and no VE Triplets were present.   He is concerned about mildly elevated blood pressure, Home BP [phone]/93 to 80 diastolic  EKG personally reviewed by myself on todays visit Normal sinus rhythm rate 64 bpm PACs  Prior records reviewed Reported having occasional breakthrough palpitations Metoprolol was held and he was started on carvedilol 6.25 twice daily Dofetilide continued  EKG personally reviewed by myself on todays visit Shows sinus bradycardia rate approximately 50 bpm, PACs noted QTC 413 ms, QRS 90 ms  Lab work reviewed  Hemoglobin A1c 6.7 down to 6.4 up to 6.6 Total cholesterol 103 LDL 51  Atrial fibrillation Early 2019, With straining, lifting, heavy lifting, had atrial fib  history of coronary disease and bypass 2015 Long history of Statins myalgia:  PMH:   has a past medical history of Anxiety, Back pain, Coronary artery disease (08/21/2013), Diabetes mellitus, Diabetic neuropathy ([place]), Diastolic dysfunction, Diverticulosis, Diverticulosis, Embolic stroke ([place]), Erectile dysfunction, Family history of anesthesia complication, Fatty liver, Flank pain, GERD (gastroesophageal reflux disease), History of bronchitis, History of chicken pox, HSV infection, Hypercholesterolemia, Hypertension, Kidney  stones, Osteoarthritis, Persistent atrial fibrillation ([place]), and PFO (patent foramen ovale).  PSH:    Past Surgical History:  Procedure Laterality Date  . CARDIAC CATHETERIZATION  08/21/2013   DR Angelena Form  . CARDIOVERSION N/A 08/24/2017   Procedure: CARDIOVERSION;  Surgeon: Minna Merritts, MD;  Location: [facility];  Service: Cardiovascular;  Laterality: N/A;  . CHOLECYSTECTOMY  1983  . CORONARY ARTERY BYPASS GRAFT N/A 08/23/2013   Procedure: CORONARY ARTERY BYPASS GRAFTING (CABG);  Surgeon: Grace Isaac, MD;  Location: [place];  Service: Open Heart Surgery;  Laterality: N/A;  CABG times six utilizing the left internal mammary artery and the right greater saphenous vein harvested endoscopically  . ENDOVEIN HARVEST OF GREATER SAPHENOUS VEIN Right 08/23/2013   Procedure: ENDOVEIN HARVEST OF GREATER SAPHENOUS VEIN;  Surgeon: Grace Isaac, MD;  Location: [place];  Service: Open Heart Surgery;  Laterality: Right;  . HERNIA REPAIR     X3  . INTRAOPERATIVE TRANSESOPHAGEAL ECHOCARDIOGRAM N/A 08/23/2013   Procedure: INTRAOPERATIVE TRANSESOPHAGEAL ECHOCARDIOGRAM;  Surgeon: Grace Isaac, MD;  Location: [place];  Service: Open Heart Surgery;  Laterality: N/A;  . LEFT HEART CATHETERIZATION WITH CORONARY ANGIOGRAM N/A 08/21/2013   Procedure: LEFT HEART CATHETERIZATION WITH CORONARY ANGIOGRAM;  Surgeon: Burnell Blanks, MD;  Location: [facility] CATH LAB;  Service: Cardiovascular;  Laterality: N/A;    Current Outpatient Medications  Medication Sig Dispense Refill  . acyclovir (ZOVIRAX) 800 MG tablet Take 1 tablet (800 mg total) by mouth 2 (two) times daily.    Marland Kitchen amLODipine (NORVASC) 10 MG tablet Take 1 tablet (10 mg total) by mouth daily. 180 tablet 3  . apixaban (ELIQUIS) 5 MG TABS tablet Take 1 tablet (5 mg total) by mouth 2 (two) times daily. 180 tablet 3  . clotrimazole-betamethasone (LOTRISONE) cream Apply 1 application topically daily as needed. 30 g 2  . COD LIVER OIL PO Take 1 capsule by  mouth 2 (two) times daily. 10 mcg    . dofetilide (TIKOSYN) 500 MCG capsule Take 1 capsule (500 mcg total) by mouth 2 (two) times daily. 180 capsule 1  . ezetimibe (ZETIA) 10 MG tablet Take 1 tablet (10 mg total) by mouth daily. 90 tablet 3  . gabapentin (NEURONTIN) 600 MG tablet Take 0.5-1 tablets (300-600 mg total) by mouth 3 (three) times daily as needed. 270 tablet 1  . loratadine (CLARITIN) 10 MG tablet Take 1 tablet (10 mg total) by mouth daily.    Marland Kitchen losartan (COZAAR) 100 MG tablet Take 1 tablet (100 mg total) by mouth daily. 90 tablet 3  . metFORMIN (GLUCOPHAGE) 850 MG tablet Take 1 tablet (850 mg total) by mouth 2 (two) times daily with a meal. 180 tablet 3  . nitroGLYCERIN (NITROSTAT) 0.4 MG SL tablet Place 1 tablet (0.4 mg total) under the tongue every 5 (five) minutes as needed for chest pain (max 3 doses). 25 tablet 6  . Omega-3 Fatty Acids (SALMON OIL PO) Take 1,000 mg by mouth. 1 twice daily    . timolol (BETIMOL) 0.5 % ophthalmic solution Place 1 drop into both eyes 2 (two) times daily.    . valACYclovir (VALTREX) 1000 MG tablet Take 1 tablet (  1,000 mg total) by mouth 2 (two) times daily as needed. 60 tablet 1   No current facility-administered medications for this visit.    Allergies:   Fenofibrate, Fire ant, and Statins   Social History:  The patient  reports that he has never smoked. He has never used smokeless tobacco. He reports that he does not drink alcohol and does not use drugs.   Family History:   family history includes CAD in his brother; Colon cancer in his maternal grandmother; Diabetes in his mother; Heart disease in his father; Parkinsonism in his maternal grandfather; Prostate cancer in his maternal uncle; Stroke in his mother and paternal grandfather.    Review of Systems: Review of Systems  Constitutional: Negative.   HENT: Negative.   Respiratory: Negative.   Cardiovascular: Positive for palpitations.  Gastrointestinal: Negative.   Musculoskeletal:  Negative.        Leg pain, foot pain  Neurological: Positive for dizziness.  Psychiatric/Behavioral: Negative.   All other systems reviewed and are negative.   PHYSICAL EXAM: VS:  BP (!) 162/68 (BP Location: Left Arm, Patient Position: Sitting, Cuff Size: Normal)   Pulse 64   Ht 5\' 7"  (1.702 m)   Wt 161 lb 5 oz (73.2 kg)   SpO2 97%   BMI 25.27 kg/m  , BMI Body mass index is 25.27 kg/m. Constitutional:  oriented to person, place, and time. No distress.  HENT:  Head: Grossly normal Eyes:  no discharge. No scleral icterus.  Neck: No JVD, no carotid bruits  Cardiovascular: Bradycardic, normal rhythm, no murmurs appreciated Pulmonary/Chest: Clear to auscultation bilaterally, no wheezes or rails Abdominal: Soft.  no distension.  no tenderness.  Musculoskeletal: Normal range of motion Neurological:  normal muscle tone. Coordination normal. No atrophy Skin: Skin warm and dry Psychiatric: normal affect, pleasant   Recent Labs: 10/29/2019: TSH 1.80 04/16/2020: ALT 11; BUN 15; Creatinine, Ser 0.81; Hemoglobin 15.3; Platelets 163.0; Potassium 4.2; Sodium 139   Lipid Panel Lab Results  Component Value Date   CHOL 103 10/29/2019   HDL 28.70 (L) 10/29/2019   LDLCALC 62 06/07/2017   TRIG 218.0 (H) 10/29/2019     Wt Readings from Last 3 Encounters:  10/16/20 161 lb 5 oz (73.2 kg)  09/02/20 166 lb (75.3 kg)  07/23/20 163 lb 12.8 oz (74.3 kg)    ASSESSMENT AND PLAN:  Persistent atrial fibrillation (HCC) Managed by Dr. 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 [name] Numerous pauses on event monitor Compliant with his Eliquis 5 twice daily on Tikosyn   Sinus bradycardia Recent concerning episodes near  syncope, syncope documented on event monitor Beta-blockers held Feels that he needs to take Lopressor for high blood pressure, Want him only to take this for episodes of breakthrough atrial fibrillation with elevated heart rate but not on a regular basis  Atherosclerosis of native coronary artery of native heart with stable angina pectoris (HCC) On zetia, LDL low Statin intolerance, no changes made  S/P CABG x 6 Currently with no symptoms of angina. No further workup at this time. Continue current medication regimen.  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   Total encounter time more than 35 minutes  Greater than 50% was spent in counseling and coordination of care with the patient   Signed, Esmond Plants, M.D., Ph.D. 10/16/2020  [place], [place]

## 2020-10-16 ENCOUNTER — Telehealth: Payer: Self-pay | Admitting: Cardiovascular Disease

## 2020-10-16 ENCOUNTER — Encounter: Payer: Self-pay | Admitting: Cardiovascular Disease

## 2020-10-16 ENCOUNTER — Ambulatory Visit (INDEPENDENT_AMBULATORY_CARE_PROVIDER_SITE_OTHER): Payer: Medicare Other | Admitting: Cardiovascular Disease

## 2020-10-16 ENCOUNTER — Other Ambulatory Visit: Payer: Self-pay

## 2020-10-16 VITALS — BP 162/68 | HR 64 | Ht 67.0 in | Wt 161.3 lb

## 2020-10-16 DIAGNOSIS — I6529 Occlusion and stenosis of unspecified carotid artery: Secondary | ICD-10-CM

## 2020-10-16 DIAGNOSIS — R55 Syncope and collapse: Secondary | ICD-10-CM

## 2020-10-16 DIAGNOSIS — I4819 Other persistent atrial fibrillation: Secondary | ICD-10-CM | POA: Diagnosis not present

## 2020-10-16 DIAGNOSIS — Z951 Presence of aortocoronary bypass graft: Secondary | ICD-10-CM | POA: Diagnosis not present

## 2020-10-16 DIAGNOSIS — I25118 Atherosclerotic heart disease of native coronary artery with other forms of angina pectoris: Secondary | ICD-10-CM | POA: Diagnosis not present

## 2020-10-16 DIAGNOSIS — I63429 Cerebral infarction due to embolism of unspecified anterior cerebral artery: Secondary | ICD-10-CM | POA: Diagnosis not present

## 2020-10-16 DIAGNOSIS — E785 Hyperlipidemia, unspecified: Secondary | ICD-10-CM

## 2020-10-16 NOTE — Patient Instructions (Addendum)
Cancel appt with Dr. Quentin Ore Prefers appt with Dr. [name] Needs asap with Dr. [name]   Medication Instructions:  For now, if blood pressure runs high, take extra 1/2 amlodipine  If you need a refill on your cardiac medications before your next appointment, please call your pharmacy.    Lab work: No new labs needed   If you have labs (blood work) drawn today and your tests are completely normal, you will receive your results only by: Marland Kitchen MyChart Message (if you have MyChart) OR . A paper copy in the mail If you have any lab test that is abnormal or we need to change your treatment, we will call you to review the results.   Testing/Procedures: No new testing needed   Follow-Up: At [facility], you and your health needs are our priority.  As part of our continuing mission to provide you with exceptional heart care, we have created designated Provider Care Teams.  These Care Teams include your primary Cardiologist (physician) and Advanced Practice Providers (APPs -  Physician Assistants and Nurse Practitioners) who all work together to provide you with the care you need, when you need it.  . You will need a follow up appointment in 6 months  . Providers on your designated Care Team:   . Murray Hodgkins, NP . Christell Faith, PA-C . Marrianne Mood, PA-C  Any Other Special Instructions Will Be Listed Below (If Applicable).  COVID-19 Vaccine Information can be found at: ShippingScam.co.uk For questions related to vaccine distribution or appointments, please email [email or web address] or call [phone].

## 2020-10-16 NOTE — Telephone Encounter (Signed)
Patient returning call re: visit with [name] per RN [facility].     Please call to discuss visit with [name] .

## 2020-10-19 ENCOUNTER — Encounter: Payer: Self-pay | Admitting: Internal Medicine

## 2020-10-19 ENCOUNTER — Other Ambulatory Visit: Payer: Self-pay

## 2020-10-19 ENCOUNTER — Ambulatory Visit: Payer: Medicare Other | Admitting: Internal Medicine

## 2020-10-19 VITALS — BP 144/68 | HR 65 | Ht 67.0 in | Wt 158.8 lb

## 2020-10-19 DIAGNOSIS — I10 Essential (primary) hypertension: Secondary | ICD-10-CM | POA: Diagnosis not present

## 2020-10-19 DIAGNOSIS — I4819 Other persistent atrial fibrillation: Secondary | ICD-10-CM | POA: Diagnosis not present

## 2020-10-19 DIAGNOSIS — R001 Bradycardia, unspecified: Secondary | ICD-10-CM | POA: Diagnosis not present

## 2020-10-19 NOTE — Telephone Encounter (Signed)
Can you see where patient's appointment was confirmed or not?

## 2020-10-19 NOTE — Telephone Encounter (Signed)
It looks like this was made by Mason Jim, unsure if she confirmed with him or not

## 2020-10-19 NOTE — Patient Instructions (Signed)

## 2020-10-19 NOTE — Progress Notes (Signed)
HPI [name] [name] returns today for followup. He is a pleasant 80 yo man with a h/o PAF who has been controlled with dofetilide. He also has HTN. He has not used salt but admits to eating salty foods. He remains active raising cattle. He has occasional break through palpitations. He gets lightheaded at times but denies syncope or near syncope. He has worn a cardiac monitor which demonstrates sinus bradycardia during the daytime in the 30's and daytime pauses of 3.5 seconds. He has NSVT. No sustained atrial fib though some brief NSSVT.  Allergies  Allergen Reactions  . Fenofibrate Other (See Comments)    Muscle weakness/pain  . Fire [employer]     Allergic reaction  . Statins Other (See Comments)    MYALGIA, muscle pain     Current Outpatient Medications  Medication Sig Dispense Refill  . acyclovir (ZOVIRAX) 800 MG tablet Take 1 tablet (800 mg total) by mouth 2 (two) times daily.    Marland Kitchen amLODipine (NORVASC) 10 MG tablet Take 1 tablet (10 mg total) by mouth daily. 180 tablet 3  . apixaban (ELIQUIS) 5 MG TABS tablet Take 1 tablet (5 mg total) by mouth 2 (two) times daily. 180 tablet 3  . clotrimazole-betamethasone (LOTRISONE) cream Apply 1 application topically daily as needed. 30 g 2  . COD LIVER OIL PO Take 1 capsule by mouth 2 (two) times daily. 10 mcg    . dofetilide (TIKOSYN) 500 MCG capsule Take 1 capsule (500 mcg total) by mouth 2 (two) times daily. 180 capsule 1  . ezetimibe (ZETIA) 10 MG tablet Take 1 tablet (10 mg total) by mouth daily. 90 tablet 3  . gabapentin (NEURONTIN) 600 MG tablet Take 0.5-1 tablets (300-600 mg total) by mouth 3 (three) times daily as needed. 270 tablet 1  . loratadine (CLARITIN) 10 MG tablet Take 1 tablet (10 mg total) by mouth daily.    Marland Kitchen losartan (COZAAR) 100 MG tablet Take 1 tablet (100 mg total) by mouth daily. 90 tablet 3  . metFORMIN (GLUCOPHAGE) 850 MG tablet Take 1 tablet (850 mg total) by mouth 2 (two) times daily with a meal. 180 tablet 3  .  nitroGLYCERIN (NITROSTAT) 0.4 MG SL tablet Place 1 tablet (0.4 mg total) under the tongue every 5 (five) minutes as needed for chest pain (max 3 doses). 25 tablet 6  . Omega-3 Fatty Acids (SALMON OIL PO) Take 1,000 mg by mouth. 1 twice daily    . timolol (BETIMOL) 0.5 % ophthalmic solution Place 1 drop into both eyes 2 (two) times daily.    . valACYclovir (VALTREX) 1000 MG tablet Take 1 tablet (1,000 mg total) by mouth 2 (two) times daily as needed. 60 tablet 1   No current facility-administered medications for this visit.     Past Medical History:  Diagnosis Date  . Anxiety   . Back pain   . Coronary artery disease 08/21/2013   a. 08/2013 s/p CABG x 6.  . Diabetes mellitus   . Diabetic neuropathy ([place])   . Diastolic dysfunction    a. 02/2017 Echo: EF 55-60%, no rwma, Gr1 DD, mildly dil Ao Root/RV. + PFO.  . Diverticulosis   . Diverticulosis   . Embolic stroke ([place])    a. 04/2017 MRI in setting of freq falls: small subacute infarction of bilat centrum semi-ovale consistent w/ embolic event-->Eliquis.  . Erectile dysfunction   . Family history of anesthesia complication    ' they cant wake my brother very easy"  .  Fatty liver   . Flank pain   . GERD (gastroesophageal reflux disease)   . History of bronchitis   . History of chicken pox   . 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 Hypercholesterolemia   . Hypertension   . Kidney stones   . Osteoarthritis   . Persistent atrial fibrillation ([place])    a. Had post-op AF 08/2013; b. 09/2015 Holter: PAC's no AF; c. 05/2017 Noted to be in AFib-->Eliquis (CHA2DS2VASc = 7); d. 08/2017 s/p DCCV.  Marland Kitchen PFO (patent foramen ovale)    a. 02/2017 Echo: + PFO.    ROS:   All systems reviewed and negative except as noted in the HPI.   Past Surgical History:  Procedure Laterality Date  . CARDIAC CATHETERIZATION  08/21/2013   DR Angelena Form  . CARDIOVERSION N/A 08/24/2017   Procedure: CARDIOVERSION;  Surgeon: Minna Merritts, MD;   Location: [facility] ORS;  Service: Cardiovascular;  Laterality: N/A;  . CHOLECYSTECTOMY  1983  . CORONARY ARTERY BYPASS GRAFT N/A 08/23/2013   Procedure: CORONARY ARTERY BYPASS GRAFTING (CABG);  Surgeon: Grace Isaac, MD;  Location: [place];  Service: Open Heart Surgery;  Laterality: N/A;  CABG times six utilizing the left internal mammary artery and the right greater saphenous vein harvested endoscopically  . ENDOVEIN HARVEST OF GREATER SAPHENOUS VEIN Right 08/23/2013   Procedure: ENDOVEIN HARVEST OF GREATER SAPHENOUS VEIN;  Surgeon: Grace Isaac, MD;  Location: [place];  Service: Open Heart Surgery;  Laterality: Right;  . HERNIA REPAIR     X3  . INTRAOPERATIVE TRANSESOPHAGEAL ECHOCARDIOGRAM N/A 08/23/2013   Procedure: INTRAOPERATIVE TRANSESOPHAGEAL ECHOCARDIOGRAM;  Surgeon: Grace Isaac, MD;  Location: [place];  Service: Open Heart Surgery;  Laterality: N/A;  . LEFT HEART CATHETERIZATION WITH CORONARY ANGIOGRAM N/A 08/21/2013   Procedure: LEFT HEART CATHETERIZATION WITH CORONARY ANGIOGRAM;  Surgeon: Burnell Blanks, MD;  Location: [facility] C[facility] LAB;  Service: Cardiovascular;  Laterality: N/A;     Family History  Problem Relation Age of Onset  . Stroke Mother   . Diabetes Mother   . Heart disease Father        heart failure  . Colon cancer Maternal Grandmother   . Parkinsonism Maternal Grandfather   . Stroke Paternal Grandfather   . Prostate cancer Maternal Uncle   . CAD Brother        CABG     Social History   Socioeconomic History  . Marital status: Married    Spouse name: Not on file  . Number of children: 2  . Years of education: HS  . Highest education level: Not on file  Occupational History  . Occupation: Retired  Tobacco Use  . Smoking status: Never Smoker  . Smokeless tobacco: Never Used  Vaping Use  . Vaping Use: Never used  Substance and Sexual Activity  . Alcohol use: No    Alcohol/week: 0.0 standard drinks  . Drug use: No  . Sexual activity: Not on file   Other Topics Concern  . 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   Right-handed.   2 cups caffeine daily   Social Determinants of Health   Financial Resource Strain: Low Risk   . Difficulty of Paying Living Expenses: Not very hard  Food Insecurity: Not on file  Transportation Needs: Not on file  Physical Activity: Not on file  Stress: Not on file  Social Connections: Not on file  Intimate Partner Violence: Not on file     BP (!) 144/68   Pulse 65   Ht 5\' 7"  (1.702 m)   Wt 158 lb 12.8 oz (72 kg)   SpO2 96%   BMI 24.87 kg/m   Physical Exam:  Well appearing 80 yo man, NAD HEENT: Unremarkable Neck:  6 cm JVD, no thyromegally Lymphatics:  No adenopathy Back:  No CVA tenderness Lungs:  Clear with no wheezes HEART:  Regular rate rhythm, no murmurs, no rubs, no clicks Abd:  soft, positive bowel sounds, no organomegally, no rebound, no guarding Ext:  2 plus pulses, no edema, no cyanosis, no clubbing Skin:  No rashes no nodules Neuro:  CN II through XII intact, motor grossly intact  DEVICE  Normal device function.  See PaceArt for details.   Assess/Plan: 1. PAF - He is maintaining NSR. He will continue dofetilide 2. HTN - I have reviewed his bp's and they are still a little high. He will continue norvasc. 3. CAD - he is s/p CABG. He remains active and has no chest pain. 4. Dyslipidemia - he will continue his Zetia. 5. Sinus node dysfunction - I discussed the issues of progressive sinus node dysfunction. He is likely to worsen but the time course is unclear. I have offered him PPM insertion but he would like to hold off and because he has not had syncope, I have recommended watchful waiting.   Carleene Overlie Taylor,MD

## 2020-10-24 ENCOUNTER — Other Ambulatory Visit: Payer: Self-pay | Admitting: Cardiovascular Disease

## 2020-10-25 ENCOUNTER — Other Ambulatory Visit: Payer: Self-pay | Admitting: Cardiovascular Disease

## 2020-10-27 DIAGNOSIS — H40021 Open angle with borderline findings, high risk, right eye: Secondary | ICD-10-CM | POA: Diagnosis not present

## 2020-10-27 DIAGNOSIS — H179 Unspecified corneal scar and opacity: Secondary | ICD-10-CM | POA: Diagnosis not present

## 2020-10-27 DIAGNOSIS — H02833 Dermatochalasis of right eye, unspecified eyelid: Secondary | ICD-10-CM | POA: Diagnosis not present

## 2020-10-27 DIAGNOSIS — E119 Type 2 diabetes mellitus without complications: Secondary | ICD-10-CM | POA: Diagnosis not present

## 2020-11-04 ENCOUNTER — Institutional Professional Consult (permissible substitution): Payer: Medicare Other | Admitting: Cardiology

## 2020-11-11 ENCOUNTER — Other Ambulatory Visit: Payer: Self-pay

## 2020-11-11 DIAGNOSIS — L570 Actinic keratosis: Secondary | ICD-10-CM | POA: Diagnosis not present

## 2020-11-11 DIAGNOSIS — L820 Inflamed seborrheic keratosis: Secondary | ICD-10-CM | POA: Diagnosis not present

## 2020-11-11 DIAGNOSIS — M720 Palmar fascial fibromatosis [Dupuytren]: Secondary | ICD-10-CM | POA: Diagnosis not present

## 2020-11-11 DIAGNOSIS — L821 Other seborrheic keratosis: Secondary | ICD-10-CM | POA: Diagnosis not present

## 2020-11-11 MED ORDER — METFORMIN HCL 850 MG PO TABS: 850.0000 mg | ORAL_TABLET | Freq: Two times a day (BID) | ORAL | 3 refills | Status: DC

## 2020-11-25 DIAGNOSIS — H524 Presbyopia: Secondary | ICD-10-CM | POA: Diagnosis not present

## 2020-12-14 ENCOUNTER — Telehealth: Payer: Self-pay

## 2020-12-14 NOTE — Chronic Care Management (AMB) (Addendum)
Chronic Care Management Pharmacy Assistant   Name: [name] [name]  MRN: [id number] DOB: 1941-03-15  Reason for Encounter: Disease State- Hypertension   Recent office visits:  None since last CCM contact  Recent consult visits:  10/27/20- Dr. Rande [name]- Ophthalmology- No data available 10/19/20- Dr. Cristopher [name]- Cardiology-continue dofetilide, continue norvasc, continue Zetia, offered  PPM insertion but he would like to hold off and because he has not had syncope. Follow up in 1 year. 10/16/20- Dr. Ida [name]- Cardiology- Patient reported not taking hydralazine 25 mg. Advised if blood pressure runs high, take extra 1/2 amlodipine. Follow up in 6 months.  Hospital visits:  None in previous 6 months  Medications: Outpatient Encounter Medications as of 12/14/2020  Medication Sig   acyclovir (ZOVIRAX) 800 MG tablet Take 1 tablet (800 mg total) by mouth 2 (two) times daily.   amLODipine (NORVASC) 10 MG tablet Take 1 tablet (10 mg total) by mouth daily.   apixaban (ELIQUIS) 5 MG TABS tablet Take 1 tablet (5 mg total) by mouth 2 (two) times daily.   carvedilol (COREG) 6.25 MG tablet Take 6.25 mg by mouth 2 (two) times daily.   clotrimazole-betamethasone (LOTRISONE) cream Apply 1 application topically daily as needed.   COD LIVER OIL PO Take 1 capsule by mouth 2 (two) times daily. 10 mcg   dofetilide (TIKOSYN) 500 MCG capsule Take 1 capsule (500 mcg total) by mouth 2 (two) times daily.   ezetimibe (ZETIA) 10 MG tablet Take 1 tablet (10 mg total) by mouth daily.   gabapentin (NEURONTIN) 600 MG tablet Take 0.5-1 tablets (300-600 mg total) by mouth 3 (three) times daily as needed.   loratadine (CLARITIN) 10 MG tablet Take 1 tablet (10 mg total) by mouth daily.   losartan (COZAAR) 100 MG tablet Take 1 tablet (100 mg total) by mouth daily.   metFORMIN (GLUCOPHAGE) 850 MG tablet Take 1 tablet (850 mg total) by mouth 2 (two) times daily with a meal.   nitroGLYCERIN (NITROSTAT) 0.4 MG SL tablet  Place 1 tablet (0.4 mg total) under the tongue every 5 (five) minutes as needed for chest pain (max 3 doses).   Omega-3 Fatty Acids (SALMON OIL PO) Take 1,000 mg by mouth. 1 twice daily   timolol (BETIMOL) 0.5 % ophthalmic solution Place 1 drop into both eyes 2 (two) times daily.   valACYclovir (VALTREX) 1000 MG tablet Take 1 tablet (1,000 mg total) by mouth 2 (two) times daily as needed.   No facility-administered encounter medications on file as of 12/14/2020.    Recent Office Vitals: BP Readings from Last 3 Encounters:  10/19/20 (!) 144/68  10/16/20 (!) 162/68  09/02/20 (!) 154/66   Pulse Readings from Last 3 Encounters:  10/19/20 65  10/16/20 64  09/02/20 (!) 55    Wt Readings from Last 3 Encounters:  10/19/20 158 lb 12.8 oz (72 kg)  10/16/20 161 lb 5 oz (73.2 kg)  09/02/20 166 lb (75.3 kg)     Kidney Function Lab Results  Component Value Date/Time   CREATININE 0.81 04/16/2020 12:25 PM   CREATININE 0.85 10/29/2019 07:44 AM   GFR 91.73 04/16/2020 12:25 PM   GFRNONAA >60 09/28/2017 10:00 AM   GFRAA >60 09/28/2017 10:00 AM    BMP Latest Ref Rng & Units 04/16/2020 10/29/2019 02/28/2019  Glucose 70 - 99 mg/dL 128(H) 179(H) 148(H)  BUN 6 - 23 mg/dL 15 13 17   Creatinine 0.40 - 1.50 mg/dL 0.81 0.85 0.88  Sodium 135 - 145 mEq/L  139 139 140  Potassium 3.5 - 5.1 mEq/L 4.2 4.2 4.5  Chloride 96 - 112 mEq/L 103 102 103  CO2 19 - 32 mEq/L 27 30 28   Calcium 8.4 - 10.5 mg/dL 9.6 10.0 9.8    Attempted contact with [name] 3 times on 12/14/20, 12/16/20 and 12/18/20. Unsuccessful outreach. Will attempt contact next month.  Current antihypertensive regimen:  Amlodipine 10 mg - 1 tablet daily Hydralazine 25 mg - 1 tablet TID as needed for BP > 150 Losartan 100 mg - 1 tablet daily    What recent interventions/DTPs have been made by any provider to improve Blood Pressure control since last CPP Visit:  monitor BP at home twice daily  Any recent hospitalizations or ED visits since last  visit with CPP? No  Adherence Review: Is the patient currently on ACE/ARB medication? Yes Does the patient have >5 day gap between last estimated fill dates? No   Star Rating Drugs:  Medication:  Last Fill: Day Supply Lostartan 100 mg 10/19/20 90 Metformin 850 mg 11/02/2020 90  Follow-Up:  Pharmacist Review  Debbora Dus, CPP notified  Margaretmary Dys, [place] Assistant [phone]  I have reviewed the care management and care coordination activities outlined in this encounter and I am certifying that I agree with the content of this note. No further action required.  Debbora Dus, PharmD Clinical Pharmacist [facility] at [facility] [phone]

## 2021-01-12 ENCOUNTER — Telehealth: Payer: Self-pay

## 2021-01-12 NOTE — Chronic Care Management (AMB) (Addendum)
Chronic Care Management Pharmacy Assistant   Name: [name] [name]  MRN: [id number] DOB: April 23, 1941  Reason for Encounter: Disease State- Hypertension   Recent office visits:  None since last CCM contact  Recent consult visits:  10/16/20 - Cardiology - Essential hypertension - Off beta-blocker secondary to bradycardia, near syncope and syncope with pauses, Off HCTZ and hydralazine secondary to intolerance -He will monitor blood pressures for now. Suggested he take extra half dose amlodipine 10 mg as needed for systolic pressure over [phone]   Hospital visits:  None in previous 6 months  Medications: Outpatient Encounter Medications as of 01/12/2021  Medication Sig   acyclovir (ZOVIRAX) 800 MG tablet Take 1 tablet (800 mg total) by mouth 2 (two) times daily.   amLODipine (NORVASC) 10 MG tablet Take 1 tablet (10 mg total) by mouth daily.   apixaban (ELIQUIS) 5 MG TABS tablet Take 1 tablet (5 mg total) by mouth 2 (two) times daily.   carvedilol (COREG) 6.25 MG tablet Take 6.25 mg by mouth 2 (two) times daily.   clotrimazole-betamethasone (LOTRISONE) cream Apply 1 application topically daily as needed.   COD LIVER OIL PO Take 1 capsule by mouth 2 (two) times daily. 10 mcg   dofetilide (TIKOSYN) 500 MCG capsule Take 1 capsule (500 mcg total) by mouth 2 (two) times daily.   ezetimibe (ZETIA) 10 MG tablet Take 1 tablet (10 mg total) by mouth daily.   gabapentin (NEURONTIN) 600 MG tablet Take 0.5-1 tablets (300-600 mg total) by mouth 3 (three) times daily as needed.   loratadine (CLARITIN) 10 MG tablet Take 1 tablet (10 mg total) by mouth daily.   losartan (COZAAR) 100 MG tablet Take 1 tablet (100 mg total) by mouth daily.   metFORMIN (GLUCOPHAGE) 850 MG tablet Take 1 tablet (850 mg total) by mouth 2 (two) times daily with a meal.   nitroGLYCERIN (NITROSTAT) 0.4 MG SL tablet Place 1 tablet (0.4 mg total) under the tongue every 5 (five) minutes as needed for chest pain (max 3 doses).   Omega-3  Fatty Acids (SALMON OIL PO) Take 1,000 mg by mouth. 1 twice daily   timolol (BETIMOL) 0.5 % ophthalmic solution Place 1 drop into both eyes 2 (two) times daily.   valACYclovir (VALTREX) 1000 MG tablet Take 1 tablet (1,000 mg total) by mouth 2 (two) times daily as needed.   No facility-administered encounter medications on file as of 01/12/2021.   Reviewed chart prior to disease state call. Spoke with patient regarding BP  Recent Office Vitals: BP Readings from Last 3 Encounters:  10/19/20 (!) 144/68  10/16/20 (!) 162/68  09/02/20 (!) 154/66   Pulse Readings from Last 3 Encounters:  10/19/20 65  10/16/20 64  09/02/20 (!) 55    Wt Readings from Last 3 Encounters:  10/19/20 158 lb 12.8 oz (72 kg)  10/16/20 161 lb 5 oz (73.2 kg)  09/02/20 166 lb (75.3 kg)     Kidney Function Lab Results  Component Value Date/Time   CREATININE 0.81 04/16/2020 12:25 PM   CREATININE 0.85 10/29/2019 07:44 AM   GFR 91.73 04/16/2020 12:25 PM   GFRNONAA >60 09/28/2017 10:00 AM   GFRAA >60 09/28/2017 10:00 AM    BMP Latest Ref Rng & Units 04/16/2020 10/29/2019 02/28/2019  Glucose 70 - 99 mg/dL 128(H) 179(H) 148(H)  BUN 6 - 23 mg/dL 15 13 17   Creatinine 0.40 - 1.50 mg/dL 0.81 0.85 0.88  Sodium 135 - 145 mEq/L 139 139 140  Potassium 3.5 -  5.1 mEq/L 4.2 4.2 4.5  Chloride 96 - 112 mEq/L 103 102 103  CO2 19 - 32 mEq/L 27 30 28   Calcium 8.4 - 10.5 mg/dL 9.6 10.0 9.8    Current antihypertensive regimen:  Amlodipine 10 mg - 1 tablet daily Losartan 100 mg - 1 tablet daily   How often are you checking your Blood Pressure? 1-2x per week   Current home BP readings:  States readings have been around 170-180/80  with arm cuff. No longer prescribed hydralazine.  What recent interventions/DTPs have been made by any provider to improve Blood Pressure control since last CPP Visit: No recent interventions  Any recent hospitalizations or ED visits since last visit with CPP? No   What diet changes have been made  to improve Blood Pressure Control?  Avoids fast foods does note sometimes he will have a chicken sandwich.   What exercise is being done to improve your Blood Pressure Control?  Patient states he exercises 3 times a week for 45 mins.   Adherence Review: Is the patient currently on ACE/ARB medication? Yes Does the patient have >5 day gap between last estimated fill dates? No   Star Rating Drugs:  Medication:                Last Fill:         Day Supply Lostartan 100 mg        10/19/20            90 Metformin 850 mg       11/17/20            90  ([facility], had another 90 DS fill from [facility] 11/02/20)  Patient stated [place] was at the beach and not keeping up with blood pressure while there.    Follow-Up:  Pharmacist Review  Debbora Dus, CPP notified  Margaretmary Dys, [place] Assistant [phone]   I have reviewed the care management and care coordination activities outlined in this encounter and I am certifying that I agree with the content of this note. Per chart review, patient was taken off carvedilol but this has been added back to his med list. Would like to confirm with patient whether or not he is taking it. Also, per CCM visit, pt home monitor runs 20 points higher than office when compared side by side. In this case, I would not recommend any changes based on home readings. Would suggest patient bring his BP monitor to next PCP visit to recheck accuracy and if not accurate, discontinue use.   Debbora Dus, PharmD Clinical Pharmacist [facility] at [facility] [phone]

## 2021-01-15 NOTE — Chronic Care Management (AMB) (Addendum)
  Contacted patient to inquire if he was still taking carvedilol and discuss his BP monitor accuracy. Several messages left 01/14/21 and 01/15/21. Unable to reach patient.    Debbora Dus, CPP notified  Margaretmary Dys, [place] Assistant [phone]  --------------------  Scheduled f/u call 02/05/21 to discuss with patient.  Debbora Dus, PharmD Clinical Pharmacist [facility] at [facility] [phone]

## 2021-02-05 ENCOUNTER — Ambulatory Visit (INDEPENDENT_AMBULATORY_CARE_PROVIDER_SITE_OTHER): Payer: Medicare Other

## 2021-02-05 ENCOUNTER — Other Ambulatory Visit: Payer: Self-pay

## 2021-02-05 DIAGNOSIS — E7800 Pure hypercholesterolemia, unspecified: Secondary | ICD-10-CM | POA: Diagnosis not present

## 2021-02-05 DIAGNOSIS — I10 Essential (primary) hypertension: Secondary | ICD-10-CM | POA: Diagnosis not present

## 2021-02-05 DIAGNOSIS — E1149 Type 2 diabetes mellitus with other diabetic neurological complication: Secondary | ICD-10-CM

## 2021-02-05 NOTE — Progress Notes (Signed)
Chronic Care Management Pharmacy Note  02/05/2021 Name:  [name] [name] MRN:  [id number] DOB:  [birth year]   Subjective: [name] [name] is an 80 y.o. year old male who is a primary patient of Damita Dunnings, Elveria Rising, MD.  The CCM team was consulted for assistance with disease management and care coordination needs.    Engaged with patient by telephone for follow up visit in response to provider referral for pharmacy case management and/or care coordination services. Contacted patient to discuss elevated home BP - see call 01/12/21 by CCM team.  Consent to Services:  The patient was given information about Chronic Care Management services, agreed to services, and gave verbal consent prior to initiation of services.  Please see initial visit note for detailed documentation.   Patient Care Team: Tonia Ghent, MD as PCP - General (Family Medicine) Rockey Situ Kathlene November, MD as PCP - Cardiology (Cardiology) Burnell Blanks, MD as Consulting Physician (Cardiology) Clarene Critchley, MD as Consulting Physician (Ophthalmology) Sydnee Levans, MD as Consulting Physician (Dermatology) Debbora Dus, [facility] as Pharmacist (Pharmacist)  Recent office visits: None since last CCM contact   Recent consult visits:  10/16/20 - Cardiology - Essential hypertension - Off beta-blocker secondary to bradycardia, near syncope and syncope with pauses, Off HCTZ and hydralazine secondary to intolerance. He will monitor blood pressures for now. Suggested he take extra half dose amlodipine 10 mg as needed for systolic pressure over [phone]   Hospital visits:  None in previous 6 months   Objective:  Lab Results  Component Value Date   CREATININE 0.81 04/16/2020   BUN 15 04/16/2020   GFR 91.73 04/16/2020   GFRNONAA >60 09/28/2017   GFRAA >60 09/28/2017   NA 139 04/16/2020   K 4.2 04/16/2020   CALCIUM 9.6 04/16/2020   CO2 27 04/16/2020   GLUCOSE 128 (H) 04/16/2020    Lab Results  Component Value  Date/Time   HGBA1C 6.6 (A) 05/21/2020 08:39 AM   HGBA1C 6.4 04/16/2020 12:25 PM   HGBA1C 6.7 (H) 10/29/2019 07:44 AM   GFR 91.73 04/16/2020 12:25 PM   GFR 86.87 10/29/2019 07:44 AM   MICROALBUR 1.3 06/02/2014 10:30 AM    Last diabetic Eye exam:  Lab Results  Component Value Date/Time   HMDIABEYEEXA No Retinopathy 06/01/2020 12:00 AM    Last diabetic Foot exam: No results found for: HMDIABFOOTEX   Lab Results  Component Value Date   CHOL 103 10/29/2019   HDL 28.70 (L) 10/29/2019   LDLCALC 62 06/07/2017   LDLDIRECT 51.0 10/29/2019   TRIG 218.0 (H) 10/29/2019   CHOLHDL 4 10/29/2019    Hepatic Function Latest Ref Rng & Units 04/16/2020 10/29/2019 09/13/2018  Total Protein 6.0 - 8.3 g/dL 6.9 6.8 6.6  Albumin 3.5 - 5.2 g/dL 4.5 4.7 4.4  AST 0 - 37 U/L _0 ALT 0 - 53 U/L _1 Alk Phosphatase 39 - 117 U/L 72 72 82  Total Bilirubin 0.2 - 1.2 mg/dL 1.1 1.6(H) 1.2  Bilirubin, Direct 0.0 - 0.3 mg/dL - - -    Lab Results  Component Value Date/Time   TSH 1.80 10/29/2019 07:44 AM   TSH 1.65 09/13/2018 08:11 AM    CBC Latest Ref Rng & Units 04/16/2020 10/29/2019 09/13/2018  WBC 4.0 - 10.5 K/uL 6.8 7.4 6.2  Hemoglobin 13.0 - 17.0 g/dL 15.3 15.5 15.0  Hematocrit 39.0 - 52.0 % 45.4 45.4 43.3  Platelets 150.0 - 400.0 K/uL 163.0 165.0 166.0  Lab Results  Component Value Date/Time   VD25OH 35.67 01/18/2017 01:16 PM    Clinical ASCVD: Yes  The ASCVD Risk score Mikey Bussing DC Jr., et al., 2013) failed to calculate for the following reasons:   The 2013 ASCVD risk score is only valid for ages 48 to 34   The patient has a prior MI or stroke diagnosis    Depression screen [facility] 2/9 07/23/2020 07/16/2020 08/16/2017  Decreased Interest 0 0 0  Down, Depressed, Hopeless 0 0 0  PHQ - 2 Score 0 0 0  Altered sleeping - - 0  Tired, decreased energy - - 0  Change in appetite - - 0  Feeling bad or failure about yourself  - - 0  Trouble concentrating - - 0  Moving slowly or fidgety/restless - - 0   Suicidal thoughts - - 0  PHQ-9 Score - - 0  Difficult doing work/chores - - Not difficult at all  Some recent data might be hidden     Social History   Tobacco Use  Smoking Status Never  Smokeless Tobacco Never   BP Readings from Last 3 Encounters:  10/19/20 (!) 144/68  10/16/20 (!) 162/68  09/02/20 (!) 154/66   Pulse Readings from Last 3 Encounters:  10/19/20 65  10/16/20 64  09/02/20 (!) 55   Wt Readings from Last 3 Encounters:  10/19/20 158 lb 12.8 oz (72 kg)  10/16/20 161 lb 5 oz (73.2 kg)  09/02/20 166 lb (75.3 kg)   BMI Readings from Last 3 Encounters:  10/19/20 24.87 kg/m  10/16/20 25.27 kg/m  09/02/20 26.00 kg/m    Assessment/Interventions: Review of patient past medical history, allergies, medications, health status, including review of consultants reports, laboratory and other test data, was performed as part of comprehensive evaluation and provision of chronic care management services.   SDOH:  (Social Determinants of Health) assessments and interventions performed: Yes  SDOH Screenings   Alcohol Screen: Not on file  Depression (PHQ2-9): Low Risk    PHQ-2 Score: 0  Financial Resource Strain: Low Risk    Difficulty of Paying Living Expenses: Not very hard  Food Insecurity: Not on file  Housing: Not on file  Physical Activity: Not on file  Social Connections: Not on file  Stress: Not on file  Tobacco Use: Low Risk    Smoking Tobacco Use: Never   Smokeless Tobacco Use: Never  Transportation Needs: Not on file    [place]  Allergies  Allergen Reactions   Fenofibrate Other (See Comments)    Muscle weakness/pain   Fire Ant     Allergic reaction   Statins Other (See Comments)    MYALGIA, muscle pain    Medications Reviewed Today     Reviewed by Debbora Dus, [facility] (Pharmacist) on 02/05/21 at [place] List Status: <None>   Medication Order Taking? Sig Documenting Provider Last Dose Status Informant  acyclovir (ZOVIRAX) 800 MG  tablet [id number]  Take 1 tablet (800 mg total) by mouth 2 (two) times daily. Tonia Ghent, MD  Active   amLODipine (NORVASC) 10 MG tablet [id number] Yes Take 1 tablet (10 mg total) by mouth daily. Minna Merritts, MD Taking Active   apixaban (ELIQUIS) 5 MG TABS tablet [id number]  Take 1 tablet (5 mg total) by mouth 2 (two) times daily. Minna Merritts, MD  Active   clotrimazole-betamethasone Donalynn Furlong) cream [id number]  Apply 1 application topically daily as needed. Tonia Ghent, MD  Active   COD LIVER  OIL PO [id number]  Take 1 capsule by mouth 2 (two) times daily. 10 mcg [provider]  Active Self  dofetilide (TIKOSYN) 500 MCG capsule [id number]  Take 1 capsule (500 mcg total) by mouth 2 (two) times daily. Minna Merritts, MD  Active   ezetimibe (ZETIA) 10 MG tablet [id number]  Take 1 tablet (10 mg total) by mouth daily. Minna Merritts, MD  Active   gabapentin (NEURONTIN) 600 MG tablet [id number]  Take 0.5-1 tablets (300-600 mg total) by mouth 3 (three) times daily as needed. Tonia Ghent, MD  Active   loratadine (CLARITIN) 10 MG tablet [id number]  Take 1 tablet (10 mg total) by mouth daily. Tonia Ghent, MD  Active   losartan (COZAAR) 100 MG tablet [id number] Yes Take 1 tablet (100 mg total) by mouth daily. Minna Merritts, MD Taking Active   metFORMIN (GLUCOPHAGE) 850 MG tablet [id number]  Take 1 tablet (850 mg total) by mouth 2 (two) times daily with a meal. Tonia Ghent, MD  Active   nitroGLYCERIN (NITROSTAT) 0.4 MG SL tablet [id number]  Place 1 tablet (0.4 mg total) under the tongue every 5 (five) minutes as needed for chest pain (max 3 doses). Tonia Ghent, MD  Active Spouse/Significant Other  Omega-3 Fatty Acids (SALMON OIL PO) [id number]  Take 1,000 mg by mouth. 1 twice daily [provider]  Active Self  timolol (BETIMOL) 0.5 % ophthalmic solution [id number]  Place 1 drop into both eyes 2 (two) times daily. [provider]  Active    valACYclovir (VALTREX) 1000 MG tablet [id number]  Take 1 tablet (1,000 mg total) by mouth 2 (two) times daily as needed. Tonia Ghent, MD  Active             Patient Active Problem List   Diagnosis Date Noted   Bradycardia 10/19/2020   Sciatica 07/08/2020   Diarrhea 04/19/2020   Hand laceration 12/29/2019   Skin lesion 12/29/2019   Allergy to ant bite 11/27/2019   Vertigo 03/04/2019   Hand abrasion 10/28/2018   Snoring 10/14/2018   Tongue abnormality 10/14/2018   Persistent atrial fibrillation ([place]) 09/18/2017   Hearing loss 08/27/2017   Glaucoma 07/23/2017   Stroke ([place]) 04/11/2017   Myopathy 03/23/2017   Weakness 01/18/2017   Arm pain 08/05/2016   Prostate cancer screening 08/04/2016   HTN (hypertension) 08/04/2016   Rectal pain 02/18/2016   Plantar fasciitis 02/18/2016   AK (actinic keratosis) 02/02/2016   Anxiety state 02/19/2015   Exertional chest pain 02/19/2015   Advance care planning 07/15/2014   Atrial fibrillation ([place]) 08/28/2013   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   Unstable angina (HCC) 08/21/2013   Angina, 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/19/2013   Lower abdominal pain 04/24/2013   Osteoporosis 06/16/2012   Medicare annual wellness visit, subsequent 06/12/2012   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   Neck pain 12/12/2011   Chest pain 11/29/2010   Hypercholesteremia 11/29/2010   Murmur 11/29/2010    Immunization History  Administered Date(s) Administered   Fluad Quad(high Dose 65+) 05/21/2020   Influenza Split 06/11/2012, 06/08/2014   Influenza,inj,Quad PF,6+ Mos 07/24/2015, 05/31/2018   Influenza-Unspecified 05/08/2016, 05/19/2017   Moderna Sars-Covid-2 Vaccination 08/19/2019, 09/17/2019   Pneumococcal Conjugate-13 07/14/2014   Pneumococcal Polysaccharide-23 06/11/2012   Td 08/08/2008   Tdap 10/26/2018    Conditions to be addressed/monitored:  Hypertension, Hyperlipidemia, and Diabetes  Care Plan : [place]  Updates made by  Debbora Dus, [facility] since  02/05/2021 12:00 AM     Problem: Hypertension, Hyperlipidemia, Diabetes, Atrial Fibrillation, Coronary Artery Disease and Osteoporosis   Priority: High  Onset Date: 09/23/2020  Note:   Current Barriers:  Blood pressure not ideally controlled  Pharmacist Clinical Goal(s):  Over the next 90 days, patient will achieve control of blood pressure as evidenced by home readings within 150/90 Maintain control of diabetes as evidenced by A1c < 7%  through collaboration with PharmD and provider.   Interventions: 1:1 collaboration with Tonia Ghent, MD regarding development and update of comprehensive plan of care as evidenced by provider attestation and co-signature Inter-disciplinary care team collaboration (see longitudinal plan of care) Comprehensive medication review performed; medication list updated in electronic medical record  Hypertension (BP goal <150/90) Not ideally controlled  -Current treatment: Amlodipine 10 mg - 1 tablet daily (may take an addition 5 mg for SBP > 150-160) Losartan 100 mg - 1 tablet daily -Medications previously tried: carvedilol - near syncope, HCTZ - dizziness, hydralazine - tachycardia  -He confirms he is not taking carvedilol. It is still on med list so I will discontinue it today. This was stopped by cardio 09/06/2020. -Current home readings: Tries to keep his home SBP around 150-155, if higher will take half tab amlodipine per cardio recommendation. He has not taken any additional doses in 2-3 months. His home BP seems to run a lot higher than office readings despite purchasing a new monitor. He reports following closely with Dr. Rockey Situ and they are happy with his current BP control.  -Exercise - stays very active, he feels great. Lots of energy. -Diet - Works hard to follow low salt/low carb diet -Recommend continue current medications for now. Consider spironolactone. I would recommend he bring his BP monitor to next doctors visit  for calibration. Limit salt and stay active.   Hyperlipidemia: (LDL goal < 70) -Controlled Updated 02/05/21, refills timely  -Current treatment: Zetia 10 mg - 1 tablet daily  Cod Liver 10 mcg - 1 capsule twice daily  Salmon oil 1000 mg - 1 capsule twice daily -Medications previously tried: statin, fenofibrate  -Educated on Cholesterol goals; LDL within goal of < 70 -Recommended to continue current medication  Diabetes (A1c goal <7%) No updates 02/05/21 -Controlled -Current medications: Metformin 850 mg - 1 tablet twice daily with a meal  -Medications previously tried: loose stools on metformin 1000 mg BID -Current home glucose readings - none reported -Recommended to continue current medication  Atrial Fibrillation (Goal: prevent stroke and major bleeding) -No updates 02/05/21 -CHADSVASC: 7 (per cardiology) -Current treatment: Rate control: none  Rhythm: Tikosyn 500 mcg - 1 capsule twice daily  Anticoagulation: Eliquis 5 mg - 1 tablet twice daily  -Medications previously tried: carvedilol  -Recommended to continue current medication  Osteoporosis (Goal: Improve bone health and prevent fractures) No updates 02/05/21 -Last DEXA Scan: None per chart Diagnosis 2013, per Dr. Estanislado Pandy -Current treatment  No pharmacotherapy -Medications previously tried: none   -Recommend [phone] units of vitamin D daily. Recommend 1200 mg of calcium daily from dietary and supplemental sources. Recommend weight-bearing and muscle strengthening exercises for building and maintaining bone density.  Neuropathy (Goal: Relieve symptoms) No updates 02/05/21 -controlled - reports symptoms controlled on PRN therapy -Current treatment  Gabapentin 600 mg - 1/2 to 1 tablet TID PRN  Reports taking about 1-2 pills daily as needed, does not exceed 3 tablets. -Medications previously tried: none reported -Recommended to continue current medication  Other: Timolol 0.5% - 1 drop twice daily for  glaucoma Valacyclovir 1000 mg - 1 tablet BID PRN flares Acyclovir 800 mg - 1 tablet BID (takes this one daily, switches to valacyclovir with flares) Denies over the counter supplements   Patient Goals/Self-Care Activities Over the next 90 days, patient will:  - continue daily BP monitoring - call if persistently above 150/90      Medication Assistance: None required.  Patient affirms current coverage meets needs.   Patient's preferred pharmacy is:  [facility], [place] [address] [place] [address] Phone: [phone] Fax: [phone]  [facility] [place] [id number] - [place], [place] - [place] [address] Phone: [phone] Fax: [phone]  [id number] [place]: [phone]: [phone]  Care Plan and Follow Up Patient Decision:  Patient agrees to Care Plan and Follow-up.   MDebbora Dus PharmD Clinical Pharmacist [phone]

## 2021-02-05 NOTE — Patient Instructions (Signed)
Dear [name] [name],  Below is a summary of the goals we discussed during our follow up appointment on February 05, 2021. Please contact me anytime with questions or concerns.   Visit Information  Patient Care Plan: CCM Pharmacy Care Plan     Problem Identified: Hypertension, Hyperlipidemia, Diabetes, Atrial Fibrillation, Coronary Artery Disease and Osteoporosis   Priority: High  Onset Date: 09/23/2020  Note:   Current Barriers:  Blood pressure not ideally controlled  Pharmacist Clinical Goal(s):  Over the next 90 days, patient will achieve control of blood pressure as evidenced by home readings within 150/90 Maintain control of diabetes as evidenced by A1c < 7%  through collaboration with PharmD and provider.   Interventions: 1:1 collaboration with Tonia Ghent, MD regarding development and update of comprehensive plan of care as evidenced by provider attestation and co-signature Inter-disciplinary care team collaboration (see longitudinal plan of care) Comprehensive medication review performed; medication list updated in electronic medical record  Hypertension (BP goal <150/90) Not ideally controlled  -Current treatment: Amlodipine 10 mg - 1 tablet daily (may take an addition 5 mg for SBP > 150-160) Losartan 100 mg - 1 tablet daily -Medications previously tried: carvedilol - near syncope, HCTZ - dizziness, hydralazine - tachycardia  -He confirms he is not taking carvedilol. It is still on med list so I will discontinue it today. This was stopped by cardio 09/06/2020. -Current home readings: Tries to keep his home SBP around 150-155, if higher will take half tab amlodipine per cardio recommendation. He has not taken any additional doses in 2-3 months. His home BP seems to run a lot higher than office readings despite purchasing a new monitor. He reports following closely with Dr. Rockey Situ and they are happy with his current BP control.  -Exercise - stays very active, he feels great. Lots  of energy. -Diet - Works hard to follow low salt/low carb diet -Recommend continue current medications for now. Consider spironolactone. I would recommend he bring his BP monitor to next doctors visit for calibration. Limit salt and stay active.   Hyperlipidemia: (LDL goal < 70) -Controlled Updated 02/05/21, refills timely  -Current treatment: Zetia 10 mg - 1 tablet daily  Cod Liver 10 mcg - 1 capsule twice daily  Salmon oil 1000 mg - 1 capsule twice daily -Medications previously tried: statin, fenofibrate  -Educated on Cholesterol goals; LDL within goal of < 70 -Recommended to continue current medication  Diabetes (A1c goal <7%) No updates 02/05/21 -Controlled -Current medications: Metformin 850 mg - 1 tablet twice daily with a meal  -Medications previously tried: loose stools on metformin 1000 mg BID -Current home glucose readings - none reported -Recommended to continue current medication  Atrial Fibrillation (Goal: prevent stroke and major bleeding) -No updates 02/05/21 -CHADSVASC: 7 (per cardiology) -Current treatment: Rate control: none  Rhythm: Tikosyn 500 mcg - 1 capsule twice daily  Anticoagulation: Eliquis 5 mg - 1 tablet twice daily  -Medications previously tried: carvedilol  -Recommended to continue current medication  Osteoporosis (Goal: Improve bone health and prevent fractures) No updates 02/05/21 -Last DEXA Scan: None per chart Diagnosis 2013, per Dr. Estanislado Pandy -Current treatment  No pharmacotherapy -Medications previously tried: none   -Recommend [phone] units of vitamin D daily. Recommend 1200 mg of calcium daily from dietary and supplemental sources. Recommend weight-bearing and muscle strengthening exercises for building and maintaining bone density.  Neuropathy (Goal: Relieve symptoms) No updates 02/05/21 -controlled - reports symptoms controlled on PRN therapy -Current treatment  Gabapentin 600 mg -  1/2 to 1 tablet TID PRN  Reports taking about 1-2 pills  daily as needed, does not exceed 3 tablets. -Medications previously tried: none reported -Recommended to continue current medication  Other: Timolol 0.5% - 1 drop twice daily for glaucoma Valacyclovir 1000 mg - 1 tablet BID PRN flares Acyclovir 800 mg - 1 tablet BID (takes this one daily, switches to valacyclovir with flares) Denies over the counter supplements   Patient Goals/Self-Care Activities Over the next 90 days, patient will:  - continue daily BP monitoring - call if persistently above 150/90      Patient verbalizes understanding of instructions provided today and agrees to view in [place].   Debbora Dus, PharmD Clinical Pharmacist [facility] [phone]

## 2021-02-13 ENCOUNTER — Other Ambulatory Visit: Payer: Self-pay | Admitting: Cardiovascular Disease

## 2021-02-18 ENCOUNTER — Encounter: Payer: Self-pay | Admitting: Internal Medicine

## 2021-02-18 ENCOUNTER — Other Ambulatory Visit: Payer: Self-pay

## 2021-02-18 ENCOUNTER — Ambulatory Visit: Payer: Medicare Other | Admitting: Internal Medicine

## 2021-02-18 DIAGNOSIS — M25421 Effusion, right elbow: Secondary | ICD-10-CM

## 2021-02-18 NOTE — Progress Notes (Signed)
Subjective:    Patient ID: [name] [name], male    DOB: 1941-04-13, 80 y.o.   MRN: [id number]  HPI Here due to elbow swelling This visit occurred during the SARS-CoV-2 public health emergency.  Safety protocols were in place, including screening questions prior to the visit, additional usage of staff PPE, and extensive cleaning of exam room while observing appropriate contact time as indicated for disinfecting solutions.   Has noticed some swelling in right elbow It doesn't bother him but wife noted it when he came back from a trip Doesn't remember any injury--but is remodeling some homes in [place] Swelling over olecranon bursa---but has some evolving bruising   No fever  Current Outpatient Medications on File Prior to Visit  Medication Sig Dispense Refill   acyclovir (ZOVIRAX) 800 MG tablet Take 1 tablet (800 mg total) by mouth 2 (two) times daily.     apixaban (ELIQUIS) 5 MG TABS tablet Take 1 tablet (5 mg total) by mouth 2 (two) times daily. 180 tablet 3   clotrimazole-betamethasone (LOTRISONE) cream Apply 1 application topically daily as needed. 30 g 2   COD LIVER OIL PO Take 1 capsule by mouth 2 (two) times daily. 10 mcg     dofetilide (TIKOSYN) 500 MCG capsule TAKE ONE CAPSULE BY MOUTH TWICE DAILY 180 capsule 0   ezetimibe (ZETIA) 10 MG tablet Take 1 tablet (10 mg total) by mouth daily. 90 tablet 3   gabapentin (NEURONTIN) 600 MG tablet Take 0.5-1 tablets (300-600 mg total) by mouth 3 (three) times daily as needed. 270 tablet 1   losartan (COZAAR) 100 MG tablet Take 1 tablet (100 mg total) by mouth daily. 90 tablet 3   metFORMIN (GLUCOPHAGE) 850 MG tablet Take 1 tablet (850 mg total) by mouth 2 (two) times daily with a meal. 180 tablet 3   nitroGLYCERIN (NITROSTAT) 0.4 MG SL tablet Place 1 tablet (0.4 mg total) under the tongue every 5 (five) minutes as needed for chest pain (max 3 doses). 25 tablet 6   Omega-3 Fatty Acids (SALMON OIL PO) Take 1,000 mg by mouth. 1 twice daily      timolol (BETIMOL) 0.5 % ophthalmic solution Place 1 drop into both eyes 2 (two) times daily.     valACYclovir (VALTREX) 1000 MG tablet Take 1 tablet (1,000 mg total) by mouth 2 (two) times daily as needed. 60 tablet 1   amLODipine (NORVASC) 10 MG tablet Take 1 tablet (10 mg total) by mouth daily. 180 tablet 3   loratadine (CLARITIN) 10 MG tablet Take 1 tablet (10 mg total) by mouth daily. (Patient not taking: Reported on 02/18/2021)     No current facility-administered medications on file prior to visit.    Allergies  Allergen Reactions   Fenofibrate Other (See Comments)    Muscle weakness/pain   Fire Ant     Allergic reaction   Statins Other (See Comments)    MYALGIA, muscle pain    Past Medical History:  Diagnosis Date   Anxiety    Back pain    Coronary artery disease 08/21/2013   a. 08/2013 s/p CABG x 6.   Diabetes mellitus    Diabetic neuropathy (HCC)    Diastolic dysfunction    a. 02/2017 Echo: EF 55-60%, no rwma, Gr1 DD, mildly dil Ao Root/RV. + PFO.   Diverticulosis    Diverticulosis    Embolic stroke ([place])    a. 04/2017 MRI in setting of freq falls: small subacute infarction of bilat centrum semi-ovale consistent  w/ embolic event-->Eliquis.   Erectile dysfunction    Family history of anesthesia complication    ' they cant wake my brother very easy"   Fatty liver    Flank pain    GERD (gastroesophageal reflux disease)    History of bronchitis    History of chicken pox    HSV infection    ocular symptoms and oral lesions   Hypercholesterolemia    Hypertension    Kidney stones    Osteoarthritis    Persistent atrial fibrillation ([place])    a. Had post-op AF 08/2013; b. 09/2015 Holter: PAC's no AF; c. 05/2017 Noted to be in AFib-->Eliquis (CHA2DS2VASc = 7); d. 08/2017 s/p DCCV.   PFO (patent foramen ovale)    a. 02/2017 Echo: + PFO.    Past Surgical History:  Procedure Laterality Date   CARDIAC CATHETERIZATION  08/21/2013   DR Angelena Form   CARDIOVERSION N/A 08/24/2017    Procedure: CARDIOVERSION;  Surgeon: Minna Merritts, MD;  Location: [facility];  Service: Cardiovascular;  Laterality: N/A;   [place] N/A 08/23/2013   Procedure: CORONARY ARTERY BYPASS GRAFTING (CABG);  Surgeon: Grace Isaac, MD;  Location: [place];  Service: Open Heart Surgery;  Laterality: N/A;  CABG times six utilizing the left internal mammary artery and the right greater saphenous vein harvested endoscopically   ENDOVEIN HARVEST OF GREATER SAPHENOUS VEIN Right 08/23/2013   Procedure: ENDOVEIN HARVEST OF GREATER SAPHENOUS VEIN;  Surgeon: Grace Isaac, MD;  Location: [place];  Service: Open Heart Surgery;  Laterality: Right;   HERNIA REPAIR     X3   INTRAOPERATIVE TRANSESOPHAGEAL ECHOCARDIOGRAM N/A 08/23/2013   Procedure: INTRAOPERATIVE TRANSESOPHAGEAL ECHOCARDIOGRAM;  Surgeon: Grace Isaac, MD;  Location: [employer] [facility];  Service: Open Heart Surgery;  Laterality: N/A;   LEFT HEART CATHETERIZATION WITH CORONARY ANGIOGRAM N/A 08/21/2013   Procedure: LEFT HEART CATHETERIZATION WITH CORONARY ANGIOGRAM;  Surgeon: Burnell Blanks, MD;  Location: [facility];  Service: Cardiovascular;  Laterality: N/A;    Family History  Problem Relation Age of Onset   Stroke Mother    Diabetes Mother    Heart disease Father        heart failure   Colon cancer Maternal Grandmother    Parkinsonism Maternal Grandfather    Stroke Paternal Grandfather    Prostate cancer Maternal Uncle    CAD Brother        CABG    Social History   Socioeconomic History   Marital status: Married    Spouse name: Not on file   Number of children: 2   Years of education: HS   Highest education level: Not on file  Occupational History   Occupation: Retired  Tobacco Use   Smoking status: Never   Smokeless tobacco: Never  Vaping Use   Vaping Use: Never used  Substance and Sexual Activity   Alcohol use: No    Alcohol/week: 0.0 standard drinks   Drug use: No   Sexual  activity: Not on file  Other Topics Concern   Not on file  Social History Narrative   Retired, remarried 1988   From [place], [place]   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   Right-handed.   2 cups caffeine daily   Social Determinants of Health   Financial Resource Strain: Low Risk    Difficulty of Paying Living Expenses: Not very hard  Food Insecurity: Not  on file  Transportation Needs: Not on file  Physical Activity: Not on file  Stress: Not on file  Social Connections: Not on file  Intimate Partner Violence: Not on file   Review of Systems Feels good Never had the gout    Objective:   Physical Exam Constitutional:      Appearance: Normal appearance.  Musculoskeletal:     Comments: Moderate swelling of right olecranon bursa. Very slightly warm but no real redness and not tender  Skin:    Comments: Small area of purplish ecchymosis along medial right elbow  Neurological:     Mental Status: He is alert.           Assessment & Plan:

## 2021-02-18 NOTE — Assessment & Plan Note (Signed)
Clearly seems traumatic since there is some adjacent bruising No infection and certainly not gouty Discussed that it should resolve on its own--but may take many weeks Recommended padding if he is going to do more remodeling work

## 2021-03-22 ENCOUNTER — Telehealth: Payer: Self-pay

## 2021-03-22 NOTE — Telephone Encounter (Signed)
[place] [facility] - Client TELEPHONE ADVICE RECORD [facility] Patient Name: [name] [name] Gender: Male DOB: 1941-05-27 Age: 80 Y 68 M 30 D Return Phone Number: [phone] (Primary), [phone] (Secondary) Address: [place] [address] Client [place] [facility] - Client Client Site [place] - Day Physician Renford Dills - MD Contact Type Call Who Is Calling Patient / Member / Family / Caregiver Call Type Triage / Clinical Relationship To Patient Self Return Phone Number [phone] (Primary) Chief Complaint Skin Lesion - Moles/ Lumps/ Growths Reason for Call Symptomatic / Request for [place] states the patient may have an infection in the bellybutton. It looks like there is a bump filled with pus. Translation No Nurse Assessment Nurse: Ricard Dillon, RN, Shelda Jakes Date/Time (Eastern Time): 03/22/2021 4:14:21 PM Confirm and document reason for call. If symptomatic, describe symptoms. ---Caller states the patient may have an infection in the bellybutton. It looks like there is a bump filled with pus. Started one week ago. Does the patient have any new or worsening symptoms? ---Yes Will a triage be completed? ---Yes Related visit to physician within the last 2 weeks? ---No Does the PT have any chronic conditions? (i.e. diabetes, asthma, this includes High risk factors for pregnancy, etc.) ---Yes List chronic conditions. ---Diabetes Is this a behavioral health or substance abuse call? ---No Guidelines Guideline Title Affirmed Question Affirmed Notes Nurse Date/Time (Eastern Time) Skin Lesion - Moles or Growths [1] Looks infected (spreading redness, pus) AND [2] no fever Ricard Dillon, RN, Shelda Jakes 03/22/2021 4:17:37 PM Disp. Time Eilene Ghazi Time) Disposition Final User 03/22/2021 4:20:01 PM See PCP within 24 Hours Yes Ricard Dillon, RN, Shelda Jakes PLEASE NOTE: All  timestamps contained within this report are represented as [place] Standard Time. CONFIDENTIALTY NOTICE: This fax transmission is intended only for the addressee. It contains information that is legally privileged, confidential or otherwise protected from use or disclosure. If you are not the intended recipient, you are strictly prohibited from reviewing, disclosing, copying using or disseminating any of this information or taking any action in reliance on or regarding this information. If you have received this fax in error, please notify us immediately by telephone so that we can arrange for its return to [place]. Phone: [phone], Toll-Free: [phone], Fax: [phone] Page: 2 of 2 Call Id: [id number] Caller Disagree/Comply Comply Caller Understands Yes PreDisposition Go to Urgent Care/Walk-In Clinic Care Advice Given Per Guideline SEE PCP WITHIN 24 HOURS: * IF OFFICE WILL BE OPEN: You need to be examined within the next 24 hours. Call your doctor (or NP/PA) when the office opens and make an appointment. CLEANING AN INFECTED WOUND: * Wash the infected area with soap and water 3 times a day. * Bleeding: Put direct pressure on the wound for 10 minutes to stop any bleeding. Use a clean cloth or gauze pad. ANTIBIOTIC OINTMENT - INFECTED AREA: * Put a small amount of antibiotic ointment on the infected area 3 times per day. * You can get this over-the-counter (OTC) at a drugstore. * Use Bacitracin ointment (OTC in [place] or Polysporin ointment (OTC in [place]) or one that you already have. * Cover the area with a clean gauze or an adhesive bandage (such as a Band-Aid). CALL BACK IF: * Fever occurs * You become worse CARE ADVICE given per Skin Lesion - Moles or Growths (Adult) guideline. Referrals REFERRED TO PCP OFFICE

## 2021-03-22 NOTE — Telephone Encounter (Signed)
I spoke with pt; pt said bump in naval is not sore but itching; pts daughter said the area is very red and size of 1/4 of pencil eraser. No drainage. No fever. Pt does not want to go to UC or ED. Pt scheduled appt with Dr Einar Pheasant on 03/23/21 at [place]. UC & ED precautions given and pt and pts daughter voiced understanding. No covid symptoms or known exposure per pts daughter. Sending note to Dr Einar Pheasant and Dr Damita Dunnings as PCP.

## 2021-03-23 ENCOUNTER — Other Ambulatory Visit: Payer: Self-pay

## 2021-03-23 ENCOUNTER — Encounter: Payer: Self-pay | Admitting: Family Medicine

## 2021-03-23 ENCOUNTER — Ambulatory Visit (INDEPENDENT_AMBULATORY_CARE_PROVIDER_SITE_OTHER): Payer: Medicare Other | Admitting: Family Medicine

## 2021-03-23 VITALS — BP 140/60 | HR 63 | Temp 98.4°F | Wt 160.0 lb

## 2021-03-23 DIAGNOSIS — L989 Disorder of the skin and subcutaneous tissue, unspecified: Secondary | ICD-10-CM

## 2021-03-23 MED ORDER — MUPIROCIN CALCIUM 2 % EX CREA: 1.0000 "application " | TOPICAL_CREAM | Freq: Two times a day (BID) | CUTANEOUS | 0 refills | Status: DC

## 2021-03-23 NOTE — Telephone Encounter (Signed)
Noted. Thanks.

## 2021-03-23 NOTE — Patient Instructions (Addendum)
#  Naval sore - use topical antibiotic ointment - call if redness outside the belly button or swelling or new lesions

## 2021-03-23 NOTE — Progress Notes (Signed)
   Subjective:     [name] [name] is a 80 y.o. male presenting for naval infection     HPI  #Naval infection - yesterday evening woke with itching - could not get comfortable - wife put - topical novacaine treatment w/ improvement - no fever or chills - improvement today - not sure if redness has worsened/improved - no hx of redness outside the naval  - no abdominal pain    Review of Systems  Gastrointestinal:  Negative for diarrhea, nausea and vomiting.    Social History   Tobacco Use  Smoking Status Never  Smokeless Tobacco Never        Objective:    BP Readings from Last 3 Encounters:  03/23/21 140/60  02/18/21 122/66  10/19/20 (!) 144/68   Wt Readings from Last 3 Encounters:  03/23/21 160 lb (72.6 kg)  02/18/21 157 lb (71.2 kg)  10/19/20 158 lb 12.8 oz (72 kg)    BP 140/60   Pulse 63   Temp 98.4 F (36.9 C) (Temporal)   Wt 160 lb (72.6 kg)   SpO2 97%   BMI 25.06 kg/m    Physical Exam Constitutional:      Appearance: Normal appearance. [name] [name] is not ill-appearing or diaphoretic.  HENT:     Right Ear: External ear normal.     Left Ear: External ear normal.  Eyes:     General: No scleral icterus.    Extraocular Movements: Extraocular movements intact.     Conjunctiva/sclera: Conjunctivae normal.  Cardiovascular:     Rate and Rhythm: Normal rate.  Pulmonary:     Effort: Pulmonary effort is normal.  Abdominal:     General: Abdomen is flat. There is no distension.     Palpations: Abdomen is soft.     Tenderness: There is no abdominal tenderness.  Musculoskeletal:     Cervical back: Neck supple.  Skin:    General: Skin is warm and dry.     Comments: Inside the umbilicus is a small erythematous ulcer with mild erythema contained to the interior umbilicus. No underlying swelling or fluctuance.   Neurological:     Mental Status: [name] [name] is alert. Mental status is at baseline.  Psychiatric:        Mood and Affect: Mood normal.          Assessment  & Plan:   Problem List Items Addressed This Visit       Musculoskeletal and Integument   Skin lesion - Primary   Relevant Medications   mupirocin cream (BACTROBAN) 2 %   Small superficial ulcer. Advised mupirocin as prevention. Discussed warning signs of infection or worsening symptoms   Return if symptoms worsen or fail to improve.  Lesleigh Noe, MD  This visit occurred during the SARS-CoV-2 public health emergency.  Safety protocols were in place, including screening questions prior to the visit, additional usage of staff PPE, and extensive cleaning of exam room while observing appropriate contact time as indicated for disinfecting solutions.

## 2021-03-23 NOTE — Telephone Encounter (Signed)
Noted will evaluate today 

## 2021-04-16 ENCOUNTER — Telehealth: Payer: Self-pay | Admitting: Family Medicine

## 2021-04-19 ENCOUNTER — Telehealth: Payer: Self-pay | Admitting: Family Medicine

## 2021-04-19 NOTE — Progress Notes (Signed)
Cardiology Office Note  Date:  04/20/2021   ID:  Magic, Kuhar 1941-03-03, MRN [id number]  PCP:  Tonia Ghent, MD   Chief Complaint  Patient presents with   6 month follow up     "Doing well." Medications reviewed by the patient verbally.     HPI:  Mr. Cancel is a 80 year old gentleman with past medical history of Hypertension Diabetes Coronary artery disease, bypass surgery 6  2015 Ejection fraction 55% by echo July 2018 mildly dilated aortic root, PFO Previous strokes,  atrial fib, on eliquis, CHA2DS2VASc = 7);  Cardioversion 08/24/2017 admitted February 2019  placed on dofetilide and reverted back to NSR.  Who presents for follow-up of his coronary artery disease and atrial fibrillation  BP "reads high" In the office Better at home, "130s", "stable"  No nerar syncope, syncope No tachycardia at night, "stable"  Seen by dr. [name] 10/2020, No med changes, pacer discussed  Labs: A1C 6.6 in 05/2020     HCTZ started 06/2020, stopped , "made him feel dizzy" Was having spells, Sometimes dizziness when he is laying down walking into the walls while ambulating in his house.  Reported 1 concerning episode several weeks prior to his last clinic visit,  he was in the parking lot at [employer] when with minimal warning, went down, woke up looking at the sky laying in the parking lot Does not feel he was out for very long, walk to his car and drove home  09/03/19: decreased coreg to 3.125 for near syncope, syncope, dizziness, bradycardia zio ordered (as below)  Dr. [name] suggested holding coreg: Stopped coreg 09/06/20  Started hydralazine for BP >150, started BID  2 weeks ago, Stopped hydralazine Woke up at night with "boom boom boom", heart pounding Continues to have episodic tachycardia   zio monitor 09/02/20 - 09/16/2020 reviewed with him as below For last week of zio was not on carvedilol Normal sinus rhythm wih paroxysmal atrial fibrillation Patient had a  min HR of 35 bpm, max HR of 211 bpm, and avg HR of 53 bpm.    1 run of Ventricular Tachycardia occurred lasting 4 beats with a max rate of 211 bpm (avg 160  bpm).    20 Supraventricular Tachycardia runs occurred, the run with the fastest interval lasting 5 beats with a max rate of 125 bpm, the longest lasting 14.1 secs with an avg rate of 93 bpm.    Atrial Fibrillation occurred (4% burden), ranging from 37-117 bpm (avg of 64 bpm), the longest lasting 13 hours 52 mins with an avg rate of 64 bpm.    16 Pauses occurred, the longest lasting 3.6 secs (17 bpm).    Junctional Rhythm was present. Isolated SVEs were occasional (3.8%, [id number]), SVE Couplets were occasional (1.5%,  7917), and SVE Triplets were rare (<1.0%, 571).  Isolated VEs were rare (<1.0%), VE Couplets were rare (<1.0%), and no VE Triplets were present.    He is concerned about mildly elevated blood pressure, Home BP 123456 to 80 diastolic  EKG personally reviewed by myself on todays visit Normal sinus rhythm rate 64 bpm PACs  Prior records reviewed Reported having occasional breakthrough palpitations Metoprolol was held and he was started on carvedilol 6.25 twice daily Dofetilide continued  EKG personally reviewed by myself on todays visit Shows sinus bradycardia rate approximately 50 bpm, PACs noted QTC 413 ms, QRS 90 ms  Lab work reviewed  Hemoglobin A1c 6.7 down to 6.4 up to 6.6 Total cholesterol 103  LDL 51  Atrial fibrillation Early 2019, With straining, lifting, heavy lifting, had atrial fib  history of coronary disease and bypass 2015 Long history of Statins myalgia:   PMH:   has a past medical history of Anxiety, Back pain, Coronary artery disease (08/21/2013), Diabetes mellitus, Diabetic neuropathy ([place]), Diastolic dysfunction, Diverticulosis, Diverticulosis, Embolic stroke ([place]), Erectile dysfunction, Family history of anesthesia complication, Fatty liver, Flank pain, GERD (gastroesophageal reflux disease),  History of bronchitis, History of chicken pox, HSV infection, Hypercholesterolemia, Hypertension, Kidney stones, Osteoarthritis, Persistent atrial fibrillation ([place]), and PFO (patent foramen ovale).  PSH:    Past Surgical History:  Procedure Laterality Date   CARDIAC CATHETERIZATION  08/21/2013   DR Angelena Form   CARDIOVERSION N/A 08/24/2017   Procedure: CARDIOVERSION;  Surgeon: Minna Merritts, MD;  Location: [facility];  Service: Cardiovascular;  Laterality: N/A;   [place] N/A 08/23/2013   Procedure: CORONARY ARTERY BYPASS GRAFTING (CABG);  Surgeon: Grace Isaac, MD;  Location: [place];  Service: Open Heart Surgery;  Laterality: N/A;  CABG times six utilizing the left internal mammary artery and the right greater saphenous vein harvested endoscopically   ENDOVEIN HARVEST OF GREATER SAPHENOUS VEIN Right 08/23/2013   Procedure: ENDOVEIN HARVEST OF GREATER SAPHENOUS VEIN;  Surgeon: Grace Isaac, MD;  Location: [place];  Service: Open Heart Surgery;  Laterality: Right;   HERNIA REPAIR     X3   INTRAOPERATIVE TRANSESOPHAGEAL ECHOCARDIOGRAM N/A 08/23/2013   Procedure: INTRAOPERATIVE TRANSESOPHAGEAL ECHOCARDIOGRAM;  Surgeon: Grace Isaac, MD;  Location: [place];  Service: Open Heart Surgery;  Laterality: N/A;   LEFT HEART CATHETERIZATION WITH CORONARY ANGIOGRAM N/A 08/21/2013   Procedure: LEFT HEART CATHETERIZATION WITH CORONARY ANGIOGRAM;  Surgeon: Burnell Blanks, MD;  Location: [facility];  Service: Cardiovascular;  Laterality: N/A;    Current Outpatient Medications  Medication Sig Dispense Refill   acyclovir (ZOVIRAX) 800 MG tablet Take 1 tablet (800 mg total) by mouth 2 (two) times daily.     amLODipine (NORVASC) 10 MG tablet Take 1 tablet (10 mg total) by mouth daily. 180 tablet 3   apixaban (ELIQUIS) 5 MG TABS tablet Take 1 tablet (5 mg total) by mouth 2 (two) times daily. 180 tablet 3   clotrimazole-betamethasone (LOTRISONE) cream  Apply 1 application topically daily as needed. 30 g 2   COD LIVER OIL PO Take 1 capsule by mouth 2 (two) times daily. 10 mcg     dofetilide (TIKOSYN) 500 MCG capsule TAKE ONE CAPSULE BY MOUTH TWICE DAILY 180 capsule 0   ezetimibe (ZETIA) 10 MG tablet Take 1 tablet (10 mg total) by mouth daily. 90 tablet 3   gabapentin (NEURONTIN) 600 MG tablet Take 0.5-1 tablets (300-600 mg total) by mouth 3 (three) times daily as needed. 270 tablet 1   loratadine (CLARITIN) 10 MG tablet Take 1 tablet (10 mg total) by mouth daily.     losartan (COZAAR) 100 MG tablet Take 1 tablet (100 mg total) by mouth daily. 90 tablet 3   metFORMIN (GLUCOPHAGE) 850 MG tablet Take 1 tablet (850 mg total) by mouth 2 (two) times daily with a meal. 180 tablet 3   mupirocin cream (BACTROBAN) 2 % Apply 1 application topically 2 (two) times daily. 15 g 0   nitroGLYCERIN (NITROSTAT) 0.4 MG SL tablet Place 1 tablet (0.4 mg total) under the tongue every 5 (five) minutes as needed for chest pain (max 3 doses). 25 tablet 6   Omega-3 Fatty Acids (  SALMON OIL PO) Take 1,000 mg by mouth. 1 twice daily     timolol (BETIMOL) 0.5 % ophthalmic solution Place 1 drop into both eyes 2 (two) times daily.     valACYclovir (VALTREX) 1000 MG tablet Take 1 tablet (1,000 mg total) by mouth 2 (two) times daily as needed. 60 tablet 1   No current facility-administered medications for this visit.    Allergies:   Fenofibrate, Fire ant, and Statins   Social History:  The patient  reports that he has never smoked. He has never used smokeless tobacco. He reports that he does not drink alcohol and does not use drugs.   Family History:   family history includes CAD in his brother; Colon cancer in his maternal grandmother; Diabetes in his mother; Heart disease in his father; Parkinsonism in his maternal grandfather; Prostate cancer in his maternal uncle; Stroke in his mother and paternal grandfather.    Review of Systems: Review of Systems  Constitutional:  Negative.   HENT: Negative.    Respiratory: Negative.    Cardiovascular: Negative.   Gastrointestinal: Negative.   Musculoskeletal: Negative.        Leg pain, foot pain  Neurological: Negative.   Psychiatric/Behavioral: Negative.    All other systems reviewed and are negative.  PHYSICAL EXAM: VS:  BP (!) 150/60 (BP Location: Left Arm, Patient Position: Sitting, Cuff Size: Normal)   Pulse (!) 54   Ht '5\' 7"'$  (1.702 m)   Wt 159 lb 8 oz (72.3 kg)   SpO2 95%   BMI 24.98 kg/m  , BMI Body mass index is 24.98 kg/m. Constitutional:  oriented to person, place, and time. No distress.  HENT:  Head: Grossly normal Eyes:  no discharge. No scleral icterus.  Neck: No JVD, no carotid bruits  Cardiovascular: Regular rate and rhythm, no murmurs appreciated Pulmonary/Chest: Clear to auscultation bilaterally, no wheezes or rails Abdominal: Soft.  no distension.  no tenderness.  Musculoskeletal: Normal range of motion Neurological:  normal muscle tone. Coordination normal. No atrophy Skin: Skin warm and dry Psychiatric: normal affect, pleasant   Recent Labs: No results found for requested labs within last 8760 hours.   Lipid Panel Lab Results  Component Value Date   CHOL 103 10/29/2019   HDL 28.70 (L) 10/29/2019   LDLCALC 62 06/07/2017   TRIG 218.0 (H) 10/29/2019     Wt Readings from Last 3 Encounters:  04/20/21 159 lb 8 oz (72.3 kg)  03/23/21 160 lb (72.6 kg)  02/18/21 157 lb (71.2 kg)    ASSESSMENT AND PLAN:  Persistent atrial fibrillation (HCC) Managed by Dr. 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 No further workup at this time. Continue  current medication regimen.  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 04/20/2021  [place], [place]

## 2021-04-19 NOTE — Telephone Encounter (Signed)
LVM for tp to rtn my call to schedule AWV with [facility].

## 2021-04-20 ENCOUNTER — Ambulatory Visit: Payer: Medicare Other | Admitting: Cardiovascular Disease

## 2021-04-20 ENCOUNTER — Encounter: Payer: Self-pay | Admitting: Cardiovascular Disease

## 2021-04-20 ENCOUNTER — Other Ambulatory Visit: Payer: Self-pay

## 2021-04-20 VITALS — BP 150/60 | HR 54 | Ht 67.0 in | Wt 159.5 lb

## 2021-04-20 DIAGNOSIS — Z951 Presence of aortocoronary bypass graft: Secondary | ICD-10-CM

## 2021-04-20 DIAGNOSIS — I63429 Cerebral infarction due to embolism of unspecified anterior cerebral artery: Secondary | ICD-10-CM

## 2021-04-20 DIAGNOSIS — I4819 Other persistent atrial fibrillation: Secondary | ICD-10-CM | POA: Diagnosis not present

## 2021-04-20 DIAGNOSIS — R55 Syncope and collapse: Secondary | ICD-10-CM

## 2021-04-20 DIAGNOSIS — I10 Essential (primary) hypertension: Secondary | ICD-10-CM

## 2021-04-20 DIAGNOSIS — I25118 Atherosclerotic heart disease of native coronary artery with other forms of angina pectoris: Secondary | ICD-10-CM

## 2021-04-20 DIAGNOSIS — I739 Peripheral vascular disease, unspecified: Secondary | ICD-10-CM

## 2021-04-20 DIAGNOSIS — E785 Hyperlipidemia, unspecified: Secondary | ICD-10-CM

## 2021-04-20 DIAGNOSIS — I6529 Occlusion and stenosis of unspecified carotid artery: Secondary | ICD-10-CM

## 2021-04-20 MED ORDER — AMLODIPINE BESYLATE 10 MG PO TABS: 10.0000 mg | ORAL_TABLET | Freq: Every day | ORAL | 3 refills | Status: DC

## 2021-04-20 MED ORDER — APIXABAN 5 MG PO TABS: 5.0000 mg | ORAL_TABLET | Freq: Two times a day (BID) | ORAL | 3 refills | Status: DC

## 2021-04-20 MED ORDER — DOFETILIDE 500 MCG PO CAPS: 500.0000 ug | ORAL_CAPSULE | Freq: Two times a day (BID) | ORAL | 3 refills | Status: DC

## 2021-04-20 MED ORDER — EZETIMIBE 10 MG PO TABS: 10.0000 mg | ORAL_TABLET | Freq: Every day | ORAL | 3 refills | Status: DC

## 2021-04-20 MED ORDER — NITROGLYCERIN 0.4 MG SL SUBL
0.4000 mg | SUBLINGUAL_TABLET | SUBLINGUAL | 6 refills | Status: DC | PRN
Start: 2021-04-20 — End: 2022-05-12

## 2021-04-20 MED ORDER — LOSARTAN POTASSIUM 100 MG PO TABS: 100.0000 mg | ORAL_TABLET | Freq: Every day | ORAL | 3 refills | Status: DC

## 2021-04-20 NOTE — Patient Instructions (Addendum)
Medication Instructions:  No changes  If you need a refill on your cardiac medications before your next appointment, please call your pharmacy.   Lab work: No new labs needed  Testing/Procedures: No new testing needed  Follow-Up: At [facility], you and your health needs are our priority.  As part of our continuing mission to provide you with exceptional heart care, we have created designated Provider Care Teams.  These Care Teams include your primary Cardiologist (physician) and Advanced Practice Providers (APPs -  Physician Assistants and Nurse Practitioners) who all work together to provide you with the care you need, when you need it.  You will need a follow up appointment in 12 months  Providers on your designated Care Team:   Christopher Berge, NP Ryan Dunn, PA-C Jacquelyn Visser, PA-C Cadence Furth, PA-C  COVID-19 Vaccine Information can be found at: [email or web address] For questions related to vaccine distribution or appointments, please email [email or web address] or call [phone].    

## 2021-04-21 ENCOUNTER — Telehealth: Payer: Self-pay | Admitting: Family Medicine

## 2021-04-21 MED ORDER — ACYCLOVIR 800 MG PO TABS: 800.0000 mg | ORAL_TABLET | Freq: Two times a day (BID) | ORAL | 3 refills | Status: DC

## 2021-04-21 NOTE — Telephone Encounter (Signed)
See other TE note. 

## 2021-04-21 NOTE — Telephone Encounter (Signed)
Sent. Thanks.   

## 2021-04-21 NOTE — Addendum Note (Signed)
Addended by: Tonia Ghent on: 04/21/2021 10:04 PM   Modules accepted: Orders

## 2021-04-21 NOTE — Telephone Encounter (Signed)
Pt needs a refill on

## 2021-04-21 NOTE — Telephone Encounter (Signed)
Refill request for acyclovir 800 mg tablets; okay to refill?

## 2021-04-22 ENCOUNTER — Telehealth: Payer: Self-pay | Admitting: Internal Medicine

## 2021-04-22 DIAGNOSIS — Z79899 Other long term (current) drug therapy: Secondary | ICD-10-CM

## 2021-04-22 DIAGNOSIS — Z5181 Encounter for therapeutic drug level monitoring: Secondary | ICD-10-CM

## 2021-04-22 NOTE — Telephone Encounter (Signed)
Pt c/o medication issue:  1. Name of Medication: tikosyn   2. How are you currently taking this medication (dosage and times per day)? Pharmacy wants to confirm   3. Are you having a reaction (difficulty breathing--STAT)? na  4. What is your medication issue? Pharmacy calling to confirm if patient is taking this med and need recent lab results   Please call [phone]  Reference [phone]

## 2021-04-22 NOTE — Telephone Encounter (Signed)
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  Mandie,  Dr. Rockey Situ refilled the patient's tikosyn on 04/20/21 at his office visit.  I didn't think he was normally refilling this since the patient also had an EP doctor (Dr. [name]) that he sees.  Tikosyn patient's should have BMP/ Magnesium ~ every 6 months, and if not that at least yearly.   Last BMP on file is 02/28/19 Last Magnesium on file is 09/28/17

## 2021-04-23 NOTE — Telephone Encounter (Signed)
Was able to reach out to [name] [name] regarding labs for his Tikosyn, needs BMP and Mag. [name] [name] reports "I haven't had labs int eh pst regarding this medication", educated pt on Tikosyn and renal/magnesium associated with drug.   Tikosyn patient's should have BMP/ Magnesium ~ every 6 months, and if not that at least yearly.  [name] [name] agrees to labs, currently out of town for work at [employer], will be able to come Tuesday around 08:30 am 9/20. For Labs  Will route to Dr. [name] as she is Pt's EP, that way she can follow for Tikosyn refills.  Pt wants refills to [facility], not [facility].

## 2021-04-23 NOTE — Telephone Encounter (Signed)
Char, pharmacist from [place], would like lab results once received. Please call [phone] with results. She states they need to monitor his labs due to refill Tikosyn due to the "problems that can be associated with this drug".

## 2021-04-27 ENCOUNTER — Other Ambulatory Visit: Payer: Self-pay

## 2021-04-27 ENCOUNTER — Other Ambulatory Visit (INDEPENDENT_AMBULATORY_CARE_PROVIDER_SITE_OTHER): Payer: Medicare Other

## 2021-04-27 ENCOUNTER — Telehealth: Payer: Self-pay | Admitting: Cardiovascular Disease

## 2021-04-27 DIAGNOSIS — Z5181 Encounter for therapeutic drug level monitoring: Secondary | ICD-10-CM

## 2021-04-27 DIAGNOSIS — I10 Essential (primary) hypertension: Secondary | ICD-10-CM

## 2021-04-27 DIAGNOSIS — Z79899 Other long term (current) drug therapy: Secondary | ICD-10-CM

## 2021-04-27 NOTE — Telephone Encounter (Signed)
Patient dropped off a prescription list found in newspaper wanted to share the price list, put in box

## 2021-04-28 LAB — BASIC METABOLIC PANEL
BUN/Creatinine Ratio: 18 (ref 10–24)
BUN: 15 mg/dL (ref 8–27)
CO2: 22 mmol/L (ref 20–29)
Calcium: 9.4 mg/dL (ref 8.6–10.2)
Chloride: 102 mmol/L (ref 96–106)
Creatinine, Ser: 0.82 mg/dL (ref 0.76–1.27)
Glucose: 197 mg/dL — ABNORMAL HIGH (ref 65–99)
Potassium: 4.8 mmol/L (ref 3.5–5.2)
Sodium: 141 mmol/L (ref 134–144)
eGFR: 89 mL/min/{1.73_m2} (ref >59)

## 2021-04-28 LAB — MAGNESIUM: Magnesium: 2.2 mg/dL (ref 1.6–2.3)

## 2021-05-11 ENCOUNTER — Ambulatory Visit: Payer: Medicare Other

## 2021-05-17 ENCOUNTER — Other Ambulatory Visit: Payer: Self-pay | Admitting: Cardiovascular Disease

## 2021-05-18 ENCOUNTER — Other Ambulatory Visit: Payer: Self-pay

## 2021-05-18 ENCOUNTER — Ambulatory Visit: Payer: Medicare Other | Attending: Internal Medicine

## 2021-05-18 DIAGNOSIS — Z23 Encounter for immunization: Secondary | ICD-10-CM

## 2021-05-18 MED ORDER — MODERNA COVID-19 BIVAL BOOSTER 50 MCG/0.5ML IM SUSP
INTRAMUSCULAR | 0 refills | Status: DC
  Filled 2021-05-18: qty 0.5, 1d supply, fill #0

## 2021-05-18 NOTE — Progress Notes (Signed)
   Covid-19 Vaccination Clinic  Name:  [name] [name]    MRN: [id number] DOB: 1941-03-06  05/18/2021  [name] was observed post Covid-19 immunization for 15 minutes without incident. He was provided with Vaccine Information Sheet and instruction to access the V-Safe system.   [name] was instructed to call 911 with any severe reactions post vaccine: Difficulty breathing  Swelling of face and throat  A fast heartbeat  A bad rash all over body  Dizziness and weakness   Lu Duffel, PharmD, MBA Clinical Acute Care Pharmacist

## 2021-05-19 ENCOUNTER — Telehealth: Payer: Self-pay

## 2021-05-19 NOTE — Chronic Care Management (AMB) (Addendum)
Chronic Care Management Pharmacy Assistant   Name: [name] [name]  MRN: [id number] DOB: April 29, 1941  Reason for Encounter:  Hypertension Disease State  Recent office visits:  03/23/21-Family Medicine-Patient presented for skin lesion.Advised mupirocin 2% topical ointment. 02/18/21-Patient presented for right elbow swelling.Recommend padding as he is doing remodeling. No medication changes.  Recent consult visits:  05/18/21-[facility] vaccination clinic- Patient presented for Covid-19 immunizaiton 04/20/21-Cardiology- Patient presented for follow up atrial fibrillatrion.EKG, reviewed previous labs, no medication changes follow up 12 months.  Hospital visits:  None in previous 6 months  Medications: Outpatient Encounter Medications as of 05/19/2021  Medication Sig   acyclovir (ZOVIRAX) 800 MG tablet Take 1 tablet (800 mg total) by mouth 2 (two) times daily.   amLODipine (NORVASC) 10 MG tablet Take 1 tablet (10 mg total) by mouth daily.   apixaban (ELIQUIS) 5 MG TABS tablet Take 1 tablet (5 mg total) by mouth 2 (two) times daily.   clotrimazole-betamethasone (LOTRISONE) cream Apply 1 application topically daily as needed.   COD LIVER OIL PO Take 1 capsule by mouth 2 (two) times daily. 10 mcg   COVID-19 mRNA bivalent vaccine, Moderna, (MODERNA COVID-19 BIVAL BOOSTER) 50 MCG/0.5ML injection Inject into the muscle.   dofetilide (TIKOSYN) 500 MCG capsule Take 1 capsule (500 mcg total) by mouth 2 (two) times daily.   ezetimibe (ZETIA) 10 MG tablet Take 1 tablet (10 mg total) by mouth daily.   gabapentin (NEURONTIN) 600 MG tablet Take 0.5-1 tablets (300-600 mg total) by mouth 3 (three) times daily as needed.   loratadine (CLARITIN) 10 MG tablet Take 1 tablet (10 mg total) by mouth daily.   losartan (COZAAR) 100 MG tablet Take 1 tablet (100 mg total) by mouth daily.   metFORMIN (GLUCOPHAGE) 850 MG tablet Take 1 tablet (850 mg total) by mouth 2 (two) times daily with a meal.   mupirocin cream  (BACTROBAN) 2 % Apply 1 application topically 2 (two) times daily.   nitroGLYCERIN (NITROSTAT) 0.4 MG SL tablet Place 1 tablet (0.4 mg total) under the tongue every 5 (five) minutes as needed for chest pain (max 3 doses).   Omega-3 Fatty Acids (SALMON OIL PO) Take 1,000 mg by mouth. 1 twice daily   timolol (BETIMOL) 0.5 % ophthalmic solution Place 1 drop into both eyes 2 (two) times daily.   valACYclovir (VALTREX) 1000 MG tablet Take 1 tablet (1,000 mg total) by mouth 2 (two) times daily as needed.   No facility-administered encounter medications on file as of 05/19/2021.    Recent Office Vitals: BP Readings from Last 3 Encounters:  04/20/21 (!) 150/60  03/23/21 140/60  02/18/21 122/66   Pulse Readings from Last 3 Encounters:  04/20/21 (!) 54  03/23/21 63  02/18/21 69    Wt Readings from Last 3 Encounters:  04/20/21 159 lb 8 oz (72.3 kg)  03/23/21 160 lb (72.6 kg)  02/18/21 157 lb (71.2 kg)     Kidney Function Lab Results  Component Value Date/Time   CREATININE 0.82 04/27/2021 09:02 AM   CREATININE 0.81 04/16/2020 12:25 PM   GFR 91.73 04/16/2020 12:25 PM   GFRNONAA >60 09/28/2017 10:00 AM   GFRAA >60 09/28/2017 10:00 AM    BMP Latest Ref Rng & Units 04/27/2021 04/16/2020 10/29/2019  Glucose 65 - 99 mg/dL 197(H) 128(H) 179(H)  BUN 8 - 27 mg/dL 15 15 13   Creatinine 0.76 - 1.27 mg/dL 0.82 0.81 0.85  BUN/Creat Ratio 10 - 24 18 - -  Sodium 134 - 144 mmol/L  141 139 139  Potassium 3.5 - 5.2 mmol/L 4.8 4.2 4.2  Chloride 96 - 106 mmol/L 102 103 102  CO2 20 - 29 mmol/L 22 27 30   Calcium 8.6 - 10.2 mg/dL 9.4 9.6 10.0     Contacted patient on 05/24/21 to discuss hypertension disease state  Current antihypertensive regimen:  Amlodipine 10mg  take 1 tablet daily  Losartan 100mg   take 1 tablet daily    Patient verbally confirms he is taking the above medications as directed. Yes  How often are you checking your Blood Pressure? infrequently  Current home BP readings: The patient  did not have any readings available as he was painting and did not have log with him he states his BP has not been elevated.  Wrist or arm cuff: arm cuff Caffeine intake: 1 cup regular coffee morning Salt intake: never adds salt to food. OTC medications including pseudoephedrine or NSAIDs?  no  Any readings above 180/120? No   What recent interventions/DTPs have been made by any provider to improve Blood Pressure control since last CPP Visit:  Patient is able to take additional 5 mg (Amlodipine 10mg ) for SBP>150-160. Patient reports he has not had to add any additional medication  Any recent hospitalizations or ED visits since last visit with CPP? No  What diet changes have been made to improve Blood Pressure Control?  The patient reports he will not eat canned foods any longer.   What exercise is being done to improve your Blood Pressure Control? Patient reports he stays active and is painting today    Adherence Review: Is the patient currently on ACE/ARB medication? Yes Does the patient have >5 day gap between last estimated fill dates? No   Star Rating Drugs:  Medication:  Last Fill: Day Supply Losartan 100mg  04/10/21  90 Matformin 850mg  04/15/21  90  Care Gaps: Annual wellness visit in last year? No  2013 Most Recent BP reading: 150/60  54-P  If Diabetic: Most recent A1C reading: 6.6  05/21/20 Last eye exam / retinopathy screening: 05/2020 Last diabetic foot exam:  10/2019  No appointments scheduled within the next 30 days.  Debbora Dus, CPP notified  Avel Sensor, [place] Assistant [phone]  I have reviewed the care management and care coordination activities outlined in this encounter and I am certifying that I agree with the content of this note. No further action required.  Debbora Dus, PharmD Clinical Pharmacist [facility] [phone]

## 2021-05-20 ENCOUNTER — Other Ambulatory Visit: Payer: Self-pay | Admitting: Cardiovascular Disease

## 2021-05-25 ENCOUNTER — Ambulatory Visit: Payer: Medicare Other

## 2021-07-10 ENCOUNTER — Telehealth: Payer: Self-pay | Admitting: Family Medicine

## 2021-07-10 MED ORDER — MOLNUPIRAVIR EUA 200MG CAPSULE: 4.0000 | ORAL_CAPSULE | Freq: Two times a day (BID) | ORAL | 0 refills | Status: AC

## 2021-07-10 NOTE — Telephone Encounter (Signed)
After hours service called indicating that pt had tested + for COVID last night after developing sxs mid-week.  Sxs are mild- no fever, CP, SOB- but given his medical hx of DM, CAD, Afib and his advanced age- he needs to start anti-virals.  Prescription for [facility] sent to pharmacy.  Pt advised to seek immediate care if he develops CP, SOB, dizziness, or other concerning sxs.

## 2021-07-12 NOTE — Telephone Encounter (Signed)
PLEASE NOTE: All timestamps contained within this report are represented as [place] Standard Time. CONFIDENTIALTY NOTICE: This fax transmission is intended only for the addressee. It contains information that is legally privileged, confidential or otherwise protected from use or disclosure. If you are not the intended recipient, you are strictly prohibited from reviewing, disclosing, copying using or disseminating any of this information or taking any action in reliance on or regarding this information. If you have received this fax in error, please notify us immediately by telephone so that we can arrange for its return to [place]. Phone: [phone], Toll-Free: [phone], Fax: [phone] Page: 1 of 3 Call Id: [id number] [place] RECORD AccessNurse Patient Name: [name] [name] Gender: Male DOB: [birth year] Age: 80 Y 17 D Return Phone Number: [phone] (Primary), [phone] (Secondary), [phone] (Alternate) Address: [place]  [address] Client [facility] Night - Client Client Site [place] Provider Renford Dills - MD Contact Type Call Who Is Calling Patient / Member / Family / Caregiver Call Type Triage / Clinical Relationship To Patient Self Return Phone Number [phone] (Primary) Chief Complaint Nasal Congestion Reason for Call Symptomatic / Request for [place] states home test for COVID19; brother tested pos yesterday; has nasal congestion and runny nose, coughing and body aches; Translation No Nurse Assessment Nurse: Mancel Bale, RN, Traci Date/Time (Eastern Time): 07/10/2021 8:23:36 AM Confirm and document reason for call. If symptomatic, describe symptoms. ---Caller states home test for COVID19; brother tested pos yesterday; has nasal congestion and runny nose, coughing and body aches; Symptoms began  mid week. States he tested positive last night. Denies temp. Denies SOB, chest pain. Does the patient have any new or worsening symptoms? ---Yes Will a triage be completed? ---Yes Related visit to physician within the last 2 weeks? ---No Does the PT have any chronic conditions? (i.e. diabetes, asthma, this includes High risk factors for pregnancy, etc.) ---Yes List chronic conditions. ---DM2, bypass surgery Is this a behavioral health or substance abuse call? ---No Guidelines Guideline Title Affirmed Question Affirmed Notes Nurse Date/Time (Eastern Time) COVID-19 - Diagnosed or Suspected [1] HIGH RISK for severe COVID complications (e.g., weak immune system, age > 55 years, obesity with BMI 30 or higher, pregnant, chronic Mancel Bale, RN, Traci 07/10/2021 8:25:02 AM PLEASE NOTE: All timestamps contained within this report are represented as [place] Standard Time. CONFIDENTIALTY NOTICE: This fax transmission is intended only for the addressee. It contains information that is legally privileged, confidential or otherwise protected from use or disclosure. If you are not the intended recipient, you are strictly prohibited from reviewing, disclosing, copying using or disseminating any of this information or taking any action in reliance on or regarding this information. If you have received this fax in error, please notify us immediately by telephone so that we can arrange for its return to [place]. Phone: [phone], Toll-Free: [phone], Fax: [phone] Page: 2 of 3 Call Id: [id number] Guidelines Guideline Title Affirmed Question Affirmed Notes Nurse Date/Time Eilene Ghazi Time) lung disease or other chronic medical condition) AND [2] COVID symptoms (e.g., cough, fever) (Exceptions: Already seen by PCP and no new or worsening symptoms.) Disp. Time Eilene Ghazi Time) Disposition Final User 07/10/2021 8:37:54 AM Paged On Call back to Call [place], [place], [place] 07/10/2021 9:09:45 AM  Called On-Call Provider Mancel Bale, RN, [place] 07/10/2021 8:29:50 AM Call PCP within 24 Hours Yes Mancel Bale, RN, Tressia Miners Caller Disagree/Comply Comply Caller Understands Yes PreDisposition  Home Care Care Advice Given Per Guideline * IF OFFICE WILL BE CLOSED: I'll page the on-call provider now. EXCEPTION: from 9 pm to 9 am. Since this isn't urgent, we'll hold the page until morning. CALL PCP WITHIN 24 HOURS: * You need to discuss this with your doctor (or NP/PA) within the next 24 hours. * Feeling dehydrated: Drink extra liquids. If the air in your home is dry, use a humidifier. * Fever: For fever over 101 F (38.3 C), take acetaminophen every 4 to 6 hours (Adults 650 mg) OR ibuprofen every 6 to 8 hours (Adults 400 mg). Before taking any medicine, read all the instructions on the package. Do not take aspirin unless your doctor has prescribed it for you. * COUGH DROPS: Over-the-counter cough drops can help a lot, especially for mild coughs. They soothe an irritated throat and remove the tickle sensation in the back of the throat. Cough drops are easy to carry with you. * HOME REMEDY - HARD CANDY: Hard candy works just as well as over-the-counter cough drops. People who have diabetes should use sugar-free candy. * HOME REMEDY - HONEY: This old home remedy has been shown to help decrease coughing at night. The adult dosage is 2 teaspoons (10 ml) at bedtime. * COUGH SYRUP WITH DEXTROMETHORPHAN: An over-the-counter cough syrup can help your cough. The most common cough suppressant in over-the-counter cough medicines is dextromethorphan. COUGH MEDICINES: * Coughing is helpful. It brings up the mucus from the lungs and helps prevent pneumonia. * For fevers above 101 F (38.3 C) take either acetaminophen or ibuprofen. * The goal of fever therapy is to bring the fever down to a comfortable level. Remember that fever medicine usually lowers fever 2 degrees F (1 - 1 1/2 degrees C). CALL BACK IF: * You become worse  CARE ADVICE given per COVID-19 - DIAGNOSED OR SUSPECTED (Adult) guideline. Comments User: Corinna Lines, RN Date/Time Eilene Ghazi Time): 07/10/2021 8:27:32 AM O2 96, PR 56 User: Corinna Lines, RN Date/Time Eilene Ghazi Time): 07/10/2021 9:23:05 AM PLEASE NOTE: All timestamps contained within this report are represented as [place] Standard Time. CONFIDENTIALTY NOTICE: This fax transmission is intended only for the addressee. It contains information that is legally privileged, confidential or otherwise protected from use or disclosure. If you are not the intended recipient, you are strictly prohibited from reviewing, disclosing, copying using or disseminating any of this information or taking any action in reliance on or regarding this information. If you have received this fax in error, please notify us immediately by telephone so that we can arrange for its return to [place]. Phone: [phone], Toll-Free: [phone], Fax: [phone] Page: 3 of 3 Call Id: [id number] Comments Informed caller on call states caller needs to start anti viral, molnupiravir. States she will call it in. States in the mean time he needs to drink fluids, rest and if symptoms become more severe he will need to go be checked out. RN advised caller to try home remedy for cough d/t cough not being continuous and not keeping him from sleeping. RN advised caller if he needs to take OTC for cough to speak with pharmacist and do not take any medication with sudafed in it. Advised caller if cough becomes worse to call back. Caller verbalized understanding. Paging DoctorName Phone DateTime Result/ Outcome Message Type Notes Dimple Nanas- MD [phone] 07/10/2021 8:37:54 AM Paged On Call Back to Call Center Doctor Paged Caller states home test for COVID19, has nasal congestion and runny nose, coughing and body aches; Symptoms  began mid week. States he tested positive last night. Denies temp. Denies SOB, chest pain.  History of DM2, bypass surgery. RN call back [phone] Dimple Nanas- MD [phone] 07/10/2021 9:09:45 AM Called On Call Provider - Reached Doctor Paged Dimple Nanas- MD 07/10/2021 9:13:36 AM Spoke with On Call - General Message Result States caller needs to start anti viral, molnupiravir. States she will call it in. States in the mean time he needs to drink fluids, rest and if symptoms become more severe he will need to go be checked out.

## 2021-07-12 NOTE — Telephone Encounter (Signed)
Spoke with patient and he was able to pick up the rx. He is feeling about the same; patient states a lot of fatigue and just feels blah. He does not have any other sx other then a very slight cough.

## 2021-07-12 NOTE — Telephone Encounter (Signed)
Please check on patient and make sure he was able to get the rx.  Please let me know how he is doing.  Thanks.

## 2021-07-12 NOTE — Telephone Encounter (Signed)
PLEASE NOTE: All timestamps contained within this report are represented as [place] Standard Time. CONFIDENTIALTY NOTICE: This fax transmission is intended only for the addressee. It contains information that is legally privileged, confidential or otherwise protected from use or disclosure. If you are not the intended recipient, you are strictly prohibited from reviewing, disclosing, copying using or disseminating any of this information or taking any action in reliance on or regarding this information. If you have received this fax in error, please notify us immediately by telephone so that we can arrange for its return to [place]. Phone: [phone], Toll-Free: [phone], Fax: [phone] Page: 1 of 3 Call Id: [id number] [place] RECORD AccessNurse Patient Name: [name] [name] Gender: Male DOB: [birth year] Age: 80 Y 17 D Return Phone Number: [phone] (Primary), [phone] (Secondary), [phone] (Alternate) Address: City[place]  [address] Client [facility] Night - Client Client Site [place] - Night Provider Renford Dills - MD Contact Type Call Who Is Calling Patient / Member / Family / Caregiver Call Type Triage / Clinical Relationship To Patient Self Return Phone Number [phone] (Primary) Chief Complaint Paging or Request for Consult Reason for Call Symptomatic / Request for [place] states he called in earlier and was advised a prescription would be sent for him over to [facility]. The phone number to pharmacy is [phone]. The prescription has not been received by the pharmacy yet. The pharmacy closes at [facility] today. Could someone please follow up with pt once prescription is sent? Translation No Nurse Assessment Nurse: Jerene Bears, RN, Will Date/Time Eilene Ghazi Time): 07/10/2021 11:17:13 AM Confirm and  document reason for call. If symptomatic, describe symptoms. ---Caller reports that her called earlier and was advised that prescription for molnupiravir would be called into pharmacy. Caller is covid positive with positive at home test last night. No script at pharmacy as of 10:45. Does the patient have any new or worsening symptoms? ---Yes Will a triage be completed? ---Yes Related visit to physician within the last 2 weeks? ---No Does the PT have any chronic conditions? (i.e. diabetes, asthma, this includes High risk factors for pregnancy, etc.) ---Yes List chronic conditions. ---dm, cardiac bypass x6, Is this a behavioral health or substance abuse call? ---No Nurse: Jerene Bears, RN, Will Date/Time Eilene Ghazi Time): 07/10/2021 11:23:38 AM Please select the assessment type ---RX called in but not at [facility] Additional Documentation ---Kristopher Oppenheim [phone] [address], [place], [place] [address] Document the name of the medication. ---molnupiravir Has the office closed within the last 30 minutes? ---No PLEASE NOTE: All timestamps contained within this report are represented as [place] Standard Time. CONFIDENTIALTY NOTICE: This fax transmission is intended only for the addressee. It contains information that is legally privileged, confidential or otherwise protected from use or disclosure. If you are not the intended recipient, you are strictly prohibited from reviewing, disclosing, copying using or disseminating any of this information or taking any action in reliance on or regarding this information. If you have received this fax in error, please notify us immediately by telephone so that we can arrange for its return to [place]. Phone: [phone], Toll-Free: [phone], Fax: [phone] Page: 2 of 3 Call Id: [id number] Nurse Assessment Does the client directives allow for assistance with medications after hours? ---Yes Is there an on-call physician for the client? ---Yes Additional  Documentation ---no med at pharmacy [place] Notes Nurse Date/Time Eilene Ghazi  Time) COVID-19 - Diagnosed or Suspected [1] HIGH RISK for severe COVID complications (e.g., weak immune system, age > 38 years, obesity with BMI 30 or higher, pregnant, chronic lung disease or other chronic medical condition) AND [2] COVID symptoms (e.g., cough, fever) (Exceptions: Already seen by PCP and no new or worsening symptoms.) Jerene Bears, RN, Will 07/10/2021 11:21:56 AM Disp. Time Eilene Ghazi Time) Disposition Final User 07/10/2021 11:32:17 AM Pharmacy Call Jerene Bears, RN, Will Reason: prescription is ready for pick up at pharmacy. 07/10/2021 11:28:59 AM Call PCP within 24 Hours Yes Jerene Bears, RN, Will Caller Disagree/Comply Comply Caller Understands Yes PreDisposition Call Doctor Care Advice Given Per Guideline CALL PCP WITHIN 24 HOURS: * IF OFFICE WILL BE CLOSED: I'll page the on-call provider now. EXCEPTION: from 9 pm to 9 am. Since this isn't urgent, we'll hold the page until morning. CARE ADVICE given per COVID-19 - DIAGNOSED OR SUSPECTED (Adult) guideline. PLEASE NOTE: All timestamps contained within this report are represented as [place] Standard Time. CONFIDENTIALTY NOTICE: This fax transmission is intended only for the addressee. It contains information that is legally privileged, confidential or otherwise protected from use or disclosure. If you are not the intended recipient, you are strictly prohibited from reviewing, disclosing, copying using or disseminating any of this information or taking any action in reliance on or regarding this information. If you have received this fax in error, please notify us immediately by telephone so that we can arrange for its return to [place]. Phone: [phone], Toll-Free: [phone], Fax: [phone] Page: 3 of 3 Call Id: [id number] Comments User: Janeece Agee, RN Date/Time Eilene Ghazi Time): 07/10/2021  11:22:51 AM runny nose, cough, hoarse voice are associated symptoms. User: Janeece Agee, RN Date/Time Eilene Ghazi Time): 07/10/2021 11:23:27 AM 96% RA o2 sat. User: Janeece Agee, RN Date/Time Eilene Ghazi Time): 07/10/2021 11:26:17 AM current oral temp of 98.1 User: Will, Jerene Bears, RN Date/Time Eilene Ghazi Time): 07/10/2021 11:34:07 AM Caller advised that prescription is available for pick up.

## 2021-07-12 NOTE — Telephone Encounter (Signed)
See phone note from doctor on call this weekend.

## 2021-07-13 NOTE — Telephone Encounter (Signed)
Noted.  I would expect him to gradually improve.  I hope he feels better soon.  Please let me know if he has any progressive symptoms.  Thanks.

## 2021-07-27 DIAGNOSIS — H0102A Squamous blepharitis right eye, upper and lower eyelids: Secondary | ICD-10-CM | POA: Diagnosis not present

## 2021-07-27 DIAGNOSIS — H0102B Squamous blepharitis left eye, upper and lower eyelids: Secondary | ICD-10-CM | POA: Diagnosis not present

## 2021-07-27 DIAGNOSIS — H179 Unspecified corneal scar and opacity: Secondary | ICD-10-CM | POA: Diagnosis not present

## 2021-07-27 DIAGNOSIS — H1045 Other chronic allergic conjunctivitis: Secondary | ICD-10-CM | POA: Diagnosis not present

## 2021-08-19 ENCOUNTER — Other Ambulatory Visit: Payer: Self-pay | Admitting: *Deleted

## 2021-08-19 MED ORDER — DOFETILIDE 500 MCG PO CAPS: 500.0000 ug | ORAL_CAPSULE | Freq: Two times a day (BID) | ORAL | 2 refills | Status: DC

## 2021-08-30 ENCOUNTER — Telehealth: Payer: Self-pay

## 2021-08-30 NOTE — Chronic Care Management (AMB) (Signed)
° ° °  Chronic Care Management Pharmacy Assistant   Name: [name] [name]  MRN: [id number] DOB: 1941-01-04   Reason for Encounter: Reminder Call   Conditions to be addressed/monitored: HTN, HLD, and DMII   Medications: Outpatient Encounter Medications as of 08/30/2021  Medication Sig   acyclovir (ZOVIRAX) 800 MG tablet Take 1 tablet (800 mg total) by mouth 2 (two) times daily.   amLODipine (NORVASC) 10 MG tablet Take 1 tablet (10 mg total) by mouth daily.   apixaban (ELIQUIS) 5 MG TABS tablet Take 1 tablet (5 mg total) by mouth 2 (two) times daily.   clotrimazole-betamethasone (LOTRISONE) cream Apply 1 application topically daily as needed.   COD LIVER OIL PO Take 1 capsule by mouth 2 (two) times daily. 10 mcg   COVID-19 mRNA bivalent vaccine, Moderna, (MODERNA COVID-19 BIVAL BOOSTER) 50 MCG/0.5ML injection Inject into the muscle.   dofetilide (TIKOSYN) 500 MCG capsule Take 1 capsule (500 mcg total) by mouth 2 (two) times daily.   ezetimibe (ZETIA) 10 MG tablet Take 1 tablet (10 mg total) by mouth daily.   gabapentin (NEURONTIN) 600 MG tablet Take 0.5-1 tablets (300-600 mg total) by mouth 3 (three) times daily as needed.   loratadine (CLARITIN) 10 MG tablet Take 1 tablet (10 mg total) by mouth daily.   losartan (COZAAR) 100 MG tablet Take 1 tablet (100 mg total) by mouth daily.   metFORMIN (GLUCOPHAGE) 850 MG tablet Take 1 tablet (850 mg total) by mouth 2 (two) times daily with a meal.   mupirocin cream (BACTROBAN) 2 % Apply 1 application topically 2 (two) times daily.   nitroGLYCERIN (NITROSTAT) 0.4 MG SL tablet Place 1 tablet (0.4 mg total) under the tongue every 5 (five) minutes as needed for chest pain (max 3 doses).   Omega-3 Fatty Acids (SALMON OIL PO) Take 1,000 mg by mouth. 1 twice daily   timolol (BETIMOL) 0.5 % ophthalmic solution Place 1 drop into both eyes 2 (two) times daily.   valACYclovir (VALTREX) 1000 MG tablet Take 1 tablet (1,000 mg total) by mouth 2 (two) times daily as  needed.   No facility-administered encounter medications on file as of 08/30/2021.   [name] was contacted to remind of upcoming telephone visit with Debbora Dus on 09/06/21 at 8:30am. Patient was reminded to have all medications, supplements and any blood glucose and blood pressure readings available for review at appointment. If unable to reach, a voicemail was left for patient.   Patient states he is unable to make the appointment due to work schedule. We will call back to reschedule after checking CPP schedule for availability  Are you having any problems with your medications? No   Do you have any concerns you like to discuss with the pharmacist? No    Star Rating Drugs: Medication:  Last Fill: Day Supply Losartan 100mg  07/27/21 90 Metformin 850mg  07/27/21 [place], CPP notified  Avel Sensor, [place] Assistant [phone]  Total time spent for month CPA: 10 min.

## 2021-09-06 ENCOUNTER — Telehealth: Payer: Medicare Other

## 2021-09-06 NOTE — Telephone Encounter (Signed)
Please reschedule for July 2022. Thank you!

## 2021-10-26 ENCOUNTER — Encounter: Payer: Self-pay | Admitting: Family Medicine

## 2021-10-26 DIAGNOSIS — D485 Neoplasm of uncertain behavior of skin: Secondary | ICD-10-CM | POA: Diagnosis not present

## 2021-10-26 DIAGNOSIS — D225 Melanocytic nevi of trunk: Secondary | ICD-10-CM | POA: Diagnosis not present

## 2021-10-26 DIAGNOSIS — L905 Scar conditions and fibrosis of skin: Secondary | ICD-10-CM | POA: Diagnosis not present

## 2021-10-26 DIAGNOSIS — L570 Actinic keratosis: Secondary | ICD-10-CM | POA: Diagnosis not present

## 2021-10-26 DIAGNOSIS — L821 Other seborrheic keratosis: Secondary | ICD-10-CM | POA: Diagnosis not present

## 2021-10-26 DIAGNOSIS — B078 Other viral warts: Secondary | ICD-10-CM | POA: Diagnosis not present

## 2021-11-04 DIAGNOSIS — H179 Unspecified corneal scar and opacity: Secondary | ICD-10-CM | POA: Diagnosis not present

## 2021-11-04 DIAGNOSIS — H02833 Dermatochalasis of right eye, unspecified eyelid: Secondary | ICD-10-CM | POA: Diagnosis not present

## 2021-11-04 DIAGNOSIS — E119 Type 2 diabetes mellitus without complications: Secondary | ICD-10-CM | POA: Diagnosis not present

## 2021-11-04 DIAGNOSIS — H40001 Preglaucoma, unspecified, right eye: Secondary | ICD-10-CM | POA: Diagnosis not present

## 2021-11-10 DIAGNOSIS — D485 Neoplasm of uncertain behavior of skin: Secondary | ICD-10-CM | POA: Diagnosis not present

## 2021-11-10 DIAGNOSIS — L988 Other specified disorders of the skin and subcutaneous tissue: Secondary | ICD-10-CM | POA: Diagnosis not present

## 2021-12-07 ENCOUNTER — Ambulatory Visit: Payer: Medicare Other | Admitting: Family Medicine

## 2021-12-09 ENCOUNTER — Other Ambulatory Visit: Payer: Self-pay | Admitting: Family Medicine

## 2021-12-14 ENCOUNTER — Ambulatory Visit (INDEPENDENT_AMBULATORY_CARE_PROVIDER_SITE_OTHER): Payer: Medicare Other | Admitting: *Deleted

## 2021-12-14 DIAGNOSIS — Z0000 Encounter for general adult medical examination without abnormal findings: Secondary | ICD-10-CM

## 2021-12-14 NOTE — Patient Instructions (Signed)
[name] [name] , ?Thank you for taking time to come for your Medicare Wellness Visit. I appreciate your ongoing commitment to your health goals. Please review the following plan we discussed and let me know if I can assist you in the future.  ? ?Screening recommendations/referrals: ?Colonoscopy: no longer required ?Recommended yearly ophthalmology/optometry visit for glaucoma screening and checkup ?Recommended yearly dental visit for hygiene and checkup ? ?Vaccinations: ?Influenza vaccine: up to date ?Pneumococcal vaccine: up to date ?Tdap vaccine: up to date ?Shingles vaccine: Education provided   ? ?Advanced directives: Education provided ? ?Conditions/risks identified:  ? ?Next appointment:   12-16-2021 @ 12:00       Pharmacy TELEPHONE  02-10-2022 @ 8:45 ? ?Preventive Care 14 Years and Older, Male ?Preventive care refers to lifestyle choices and visits with your health care provider that can promote health and wellness. ?What does preventive care include? ?A yearly physical exam. This is also called an annual well check. ?Dental exams once or twice a year. ?Routine eye exams. Ask your health care provider how often you should have your eyes checked. ?Personal lifestyle choices, including: ?Daily care of your teeth and gums. ?Regular physical activity. ?Eating a healthy diet. ?Avoiding tobacco and drug use. ?Limiting alcohol use. ?Practicing safe sex. ?Taking low doses of aspirin every day. ?Taking vitamin and mineral supplements as recommended by your health care provider. ?What happens during an annual well check? ?The services and screenings done by your health care provider during your annual well check will depend on your age, overall health, lifestyle risk factors, and family history of disease. ?Counseling  ?Your health care provider may ask you questions about your: ?Alcohol use. ?Tobacco use. ?Drug use. ?Emotional well-being. ?Home and relationship well-being. ?Sexual activity. ?Eating habits. ?History of  falls. ?Memory and ability to understand (cognition). ?Work and work Statistician. ?Screening  ?You may have the following tests or measurements: ?Height, weight, and BMI. ?Blood pressure. ?Lipid and cholesterol levels. These may be checked every 5 years, or more frequently if you are over 70 years old. ?Skin check. ?Lung cancer screening. You may have this screening every year starting at age 70 if you have a 30-pack-year history of smoking and currently smoke or have quit within the past 15 years. ?Fecal occult blood test (FOBT) of the stool. You may have this test every year starting at age 5. ?Flexible sigmoidoscopy or colonoscopy. You may have a sigmoidoscopy every 5 years or a colonoscopy every 10 years starting at age 63. ?Prostate cancer screening. Recommendations will vary depending on your family history and other risks. ?Hepatitis C blood test. ?Hepatitis B blood test. ?Sexually transmitted disease (STD) testing. ?Diabetes screening. This is done by checking your blood sugar (glucose) after you have not eaten for a while (fasting). You may have this done every 1-3 years. ?Abdominal aortic aneurysm (AAA) screening. You may need this if you are a current or former smoker. ?Osteoporosis. You may be screened starting at age 16 if you are at high risk. ?Talk with your health care provider about your test results, treatment options, and if necessary, the need for more tests. ?Vaccines  ?Your health care provider may recommend certain vaccines, such as: ?Influenza vaccine. This is recommended every year. ?Tetanus, diphtheria, and acellular pertussis (Tdap, Td) vaccine. You may need a Td booster every 10 years. ?Zoster vaccine. You may need this after age 51. ?Pneumococcal 13-valent conjugate (PCV13) vaccine. One dose is recommended after age 74. ?Pneumococcal polysaccharide (PPSV23) vaccine. One dose is recommended after  age 60. ?Talk to your health care provider about which screenings and vaccines you need and  how often you need them. ?This information is not intended to replace advice given to you by your health care provider. Make sure you discuss any questions you have with your health care provider. ?Document Released: 08/21/2015 Document Revised: 04/13/2016 Document Reviewed: 05/26/2015 ?Elsevier Interactive Patient Education ? 2017 [place]. ? ?Fall Prevention in the Home ?Falls can cause injuries. They can happen to people of all ages. There are many things you can do to make your home safe and to help prevent falls. ?What can I do on the outside of my home? ?Regularly fix the edges of walkways and driveways and fix any cracks. ?Remove anything that might make you trip as you walk through a door, such as a raised step or threshold. ?Trim any bushes or trees on the path to your home. ?Use bright outdoor lighting. ?Clear any walking paths of anything that might make someone trip, such as rocks or tools. ?Regularly check to see if handrails are loose or broken. Make sure that both sides of any steps have handrails. ?Any raised decks and porches should have guardrails on the edges. ?Have any leaves, snow, or ice cleared regularly. ?Use sand or salt on walking paths during winter. ?Clean up any spills in your garage right away. This includes oil or grease spills. ?What can I do in the bathroom? ?Use night lights. ?Install grab bars by the toilet and in the tub and shower. Do not use towel bars as grab bars. ?Use non-skid mats or decals in the tub or shower. ?If you need to sit down in the shower, use a plastic, non-slip stool. ?Keep the floor dry. Clean up any water that spills on the floor as soon as it happens. ?Remove soap buildup in the tub or shower regularly. ?Attach bath mats securely with double-sided non-slip rug tape. ?Do not have throw rugs and other things on the floor that can make you trip. ?What can I do in the bedroom? ?Use night lights. ?Make sure that you have a light by your bed that is easy to  reach. ?Do not use any sheets or blankets that are too big for your bed. They should not hang down onto the floor. ?Have a firm chair that has side arms. You can use this for support while you get dressed. ?Do not have throw rugs and other things on the floor that can make you trip. ?What can I do in the kitchen? ?Clean up any spills right away. ?Avoid walking on wet floors. ?Keep items that you use a lot in easy-to-reach places. ?If you need to reach something above you, use a strong step stool that has a grab bar. ?Keep electrical cords out of the way. ?Do not use floor polish or wax that makes floors slippery. If you must use wax, use non-skid floor wax. ?Do not have throw rugs and other things on the floor that can make you trip. ?What can I do with my stairs? ?Do not leave any items on the stairs. ?Make sure that there are handrails on both sides of the stairs and use them. Fix handrails that are broken or loose. Make sure that handrails are as long as the stairways. ?Check any carpeting to make sure that it is firmly attached to the stairs. Fix any carpet that is loose or worn. ?Avoid having throw rugs at the top or bottom of the stairs. If you do  have throw rugs, attach them to the floor with carpet tape. ?Make sure that you have a light switch at the top of the stairs and the bottom of the stairs. If you do not have them, ask someone to add them for you. ?What else can I do to help prevent falls? ?Wear shoes that: ?Do not have high heels. ?Have rubber bottoms. ?Are comfortable and fit you well. ?Are closed at the toe. Do not wear sandals. ?If you use a stepladder: ?Make sure that it is fully opened. Do not climb a closed stepladder. ?Make sure that both sides of the stepladder are locked into place. ?Ask someone to hold it for you, if possible. ?Clearly mark and make sure that you can see: ?Any grab bars or handrails. ?First and last steps. ?Where the edge of each step is. ?Use tools that help you move  around (mobility aids) if they are needed. These include: ?Canes. ?Walkers. ?Scooters. ?Crutches. ?Turn on the lights when you go into a dark area. Replace any light bulbs as soon as they burn out. ?Set up your furn

## 2021-12-14 NOTE — Progress Notes (Signed)
? ?Subjective:  ? [name] [name] is a 81 y.o. male who presents for Medicare Annual/Subsequent preventive examination. ? ? ?I connected with  [name] on 12/14/21 by a telephone enabled telemedicine application and verified that I am speaking with the correct person using two identifiers. ?  ?I discussed the limitations of evaluation and management by telemedicine. The patient expressed understanding and agreed to proceed. ? ?Patient location: home ? ?Provider location: tele-health-home ? ?Review of Systems    ? ?  ? ?   ?Objective:  ?  ?Today's Vitals  ? 12/14/21 1134  ?PainSc: 3   ? ?There is no height or weight on file to calculate BMI. ? ? ?  09/18/2017  ?  3:15 PM 08/24/2017  ?  8:13 AM 08/16/2017  ?  2:28 PM 03/10/2017  ?  7:32 AM 01/19/2017  ?  7:22 PM 01/03/2017  ? 12:38 AM 07/26/2016  ?  8:49 AM  ?Advanced Directives  ?Does Patient Have a Medical Advance Directive? No No No No No No Yes  ?Type of IT consultant of [place];Living will  ?Copy of [place] in Chart?       No - copy requested  ?Would patient like information on creating a medical advance directive? No - Patient declined No - Patient declined No - Patient declined   No - Patient declined   ? ? ?Current Medications (verified) ?Outpatient Encounter Medications as of 12/14/2021  ?Medication Sig  ? amLODipine (NORVASC) 10 MG tablet Take 1 tablet (10 mg total) by mouth daily.  ? apixaban (ELIQUIS) 5 MG TABS tablet Take 1 tablet (5 mg total) by mouth 2 (two) times daily.  ? COD LIVER OIL PO Take 1 capsule by mouth 2 (two) times daily. 10 mcg  ? dofetilide (TIKOSYN) 500 MCG capsule Take 1 capsule (500 mcg total) by mouth 2 (two) times daily.  ? ezetimibe (ZETIA) 10 MG tablet Take 1 tablet (10 mg total) by mouth daily.  ? gabapentin (NEURONTIN) 600 MG tablet Take 0.5-1 tablets (300-600 mg total) by mouth 3 (three) times daily as needed.  ? losartan (COZAAR) 100 MG tablet Take 1 tablet (100 mg total) by mouth daily.   ? metFORMIN (GLUCOPHAGE) 850 MG tablet TAKE 1 TABLET TWICE A DAY WITH MEALS  ? mupirocin cream (BACTROBAN) 2 % Apply 1 application topically 2 (two) times daily.  ? nitroGLYCERIN (NITROSTAT) 0.4 MG SL tablet Place 1 tablet (0.4 mg total) under the tongue every 5 (five) minutes as needed for chest pain (max 3 doses).  ? timolol (BETIMOL) 0.5 % ophthalmic solution Place 1 drop into both eyes 2 (two) times daily.  ? valACYclovir (VALTREX) 1000 MG tablet Take 1 tablet (1,000 mg total) by mouth 2 (two) times daily as needed.  ? acyclovir (ZOVIRAX) 800 MG tablet Take 1 tablet (800 mg total) by mouth 2 (two) times daily.  ? clotrimazole-betamethasone (LOTRISONE) cream Apply 1 application topically daily as needed. (Patient not taking: Reported on 12/14/2021)  ? COVID-19 mRNA bivalent vaccine, Moderna, (MODERNA COVID-19 BIVAL BOOSTER) 50 MCG/0.5ML injection Inject into the muscle. (Patient not taking: Reported on 12/14/2021)  ? loratadine (CLARITIN) 10 MG tablet Take 1 tablet (10 mg total) by mouth daily. (Patient not taking: Reported on 12/14/2021)  ? Omega-3 Fatty Acids (SALMON OIL PO) Take 1,000 mg by mouth. 1 twice daily (Patient not taking: Reported on 12/14/2021)  ? ?No facility-administered encounter medications on file as of  12/14/2021.  ? ? ?Allergies (verified) ?Fenofibrate, Fire ant, and Statins  ? ?History: ?Past Medical History:  ?Diagnosis Date  ? Anxiety   ? Back pain   ? Coronary artery disease 08/21/2013  ? a. 08/2013 s/p CABG x 6.  ? Diabetes mellitus   ? Diabetic neuropathy ([place])   ? Diastolic dysfunction   ? a. 02/2017 Echo: EF 55-60%, no rwma, Gr1 DD, mildly dil Ao Root/RV. + PFO.  ? Diverticulosis   ? Diverticulosis   ? Embolic stroke ([place])   ? a. 04/2017 MRI in setting of freq falls: small subacute infarction of bilat centrum semi-ovale consistent w/ embolic event-->Eliquis.  ? Erectile dysfunction   ? Family history of anesthesia complication   ? ' they cant wake my brother very easy"  ? Fatty liver   ? Flank  pain   ? GERD (gastroesophageal reflux disease)   ? History of bronchitis   ? History of chicken pox   ? HSV infection   ? ocular symptoms and oral lesions  ? Hypercholesterolemia   ? Hypertension   ? Kidney stones   ? Osteoarthritis   ? Persistent atrial fibrillation ([place])   ? a. Had post-op AF 08/2013; b. 09/2015 Holter: PAC's no AF; c. 05/2017 Noted to be in AFib-->Eliquis (CHA2DS2VASc = 7); d. 08/2017 s/p DCCV.  ? PFO (patent foramen ovale)   ? a. 02/2017 Echo: + PFO.  ? ?Past Surgical History:  ?Procedure Laterality Date  ? CARDIAC CATHETERIZATION  08/21/2013  ? DR Angelena Form  ? CARDIOVERSION N/A 08/24/2017  ? Procedure: CARDIOVERSION;  Surgeon: Minna Merritts, MD;  Location: [facility];  Service: Cardiovascular;  Laterality: N/A;  ? CHOLECYSTECTOMY  1983  ? CORONARY ARTERY BYPASS GRAFT N/A 08/23/2013  ? Procedure: CORONARY ARTERY BYPASS GRAFTING (CABG);  Surgeon: Grace Isaac, MD;  Location: [facility];  Service: Open Heart Surgery;  Laterality: N/A;  CABG times six utilizing the left internal mammary artery and the right greater saphenous vein harvested endoscopically  ? ENDOVEIN HARVEST OF GREATER SAPHENOUS VEIN Right 08/23/2013  ? Procedure: ENDOVEIN HARVEST OF GREATER SAPHENOUS VEIN;  Surgeon: Grace Isaac, MD;  Location: [facility];  Service: Open Heart Surgery;  Laterality: Right;  ? HERNIA REPAIR    ? X3  ? INTRAOPERATIVE TRANSESOPHAGEAL ECHOCARDIOGRAM N/A 08/23/2013  ? Procedure: INTRAOPERATIVE TRANSESOPHAGEAL ECHOCARDIOGRAM;  Surgeon: Grace Isaac, MD;  Location: [facility];  Service: Open Heart Surgery;  Laterality: N/A;  ? LEFT HEART CATHETERIZATION WITH CORONARY ANGIOGRAM N/A 08/21/2013  ? Procedure: LEFT HEART CATHETERIZATION WITH CORONARY ANGIOGRAM;  Surgeon: Burnell Blanks, MD;  Location: [facility] C[facility];  Service: Cardiovascular;  Laterality: N/A;  ? ?Family History  ?Problem Relation Age of Onset  ? Stroke Mother   ? Diabetes Mother   ? Heart disease Father   ?     heart failure  ? Colon cancer  Maternal Grandmother   ? Parkinsonism Maternal Grandfather   ? Stroke Paternal Grandfather   ? Prostate cancer Maternal Uncle   ? CAD Brother   ?     CABG  ? ?Social History  ? ?Socioeconomic History  ? Marital status: Married  ?  Spouse name: Not on file  ? Number of children: 2  ? Years of education: HS  ? Highest education level: Not on file  ?Occupational History  ? Occupation: Retired  ?Tobacco Use  ? Smoking status: Never  ? Smokeless tobacco: Never  ?Vaping Use  ? Vaping Use: Never used  ?  Substance and Sexual Activity  ? Alcohol use: No  ?  Alcohol/week: 0.0 standard drinks  ? Drug use: No  ? Sexual activity: Not on file  ?Other Topics Concern  ? Not on file  ?Social History Narrative  ? Retired, remarried 1988  ? From [place], [place]  ? 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 Former Insurance underwriter.  ? Right-handed.  ? 2 cups caffeine daily  ? ?Social Determinants of Health  ? ?Financial Resource Strain: Low Risk   ? Difficulty of Paying Living Expenses: Not hard at all  ?Food Insecurity: No Food Insecurity  ? Worried About Charity fundraiser in the Last Year: Never true  ? Ran Out of Food in the Last Year: Never true  ?Transportation Needs: No Transportation Needs  ? Lack of Transportation (Medical): No  ? Lack of Transportation (Non-Medical): No  ?Physical Activity: Sufficiently Active  ? Days of Exercise per Week: 5 days  ? Minutes of Exercise per Session: 60 min  ?Stress: No Stress Concern Present  ? Feeling of Stress : Not at all  ?Social Connections: Moderately Integrated  ? Frequency of Communication with Friends and Family: Three times a week  ? Frequency of Social Gatherings with Friends and Family: Twice a week  ? Attends Religious Services: Never  ? Active Member of Clubs or Organizations: Yes  ? Attends Archivist Meetings: More than 4 times per year  ? Marital Status: Married  ? ? ?Tobacco Counseling ?Counseling given: Not Answered ? ? ?Clinical  Intake: ? ?Pre-visit preparation completed: Yes ? ?Pain : 0-10 ?Pain Score: 3  ?Pain Location: Back (stomach) ?Pain Descriptors / Indicators: Grimacing, Burning, Dull, Aching ?Pain Onset: More than a month ago ?Pain Glori Luis

## 2021-12-16 ENCOUNTER — Ambulatory Visit (INDEPENDENT_AMBULATORY_CARE_PROVIDER_SITE_OTHER): Payer: Medicare Other | Admitting: Family Medicine

## 2021-12-16 ENCOUNTER — Encounter: Payer: Self-pay | Admitting: Family Medicine

## 2021-12-16 VITALS — BP 138/78 | HR 61 | Temp 97.6°F | Ht 67.0 in | Wt 152.0 lb

## 2021-12-16 DIAGNOSIS — R109 Unspecified abdominal pain: Secondary | ICD-10-CM

## 2021-12-16 DIAGNOSIS — I4891 Unspecified atrial fibrillation: Secondary | ICD-10-CM | POA: Diagnosis not present

## 2021-12-16 DIAGNOSIS — E1149 Type 2 diabetes mellitus with other diabetic neurological complication: Secondary | ICD-10-CM | POA: Diagnosis not present

## 2021-12-16 LAB — COMPREHENSIVE METABOLIC PANEL
ALT: 13 U/L (ref 0–53)
AST: 18 U/L (ref 0–37)
Albumin: 4.6 g/dL (ref 3.5–5.2)
Alkaline Phosphatase: 85 U/L (ref 39–117)
BUN: 22 mg/dL (ref 6–23)
CO2: 29 mEq/L (ref 19–32)
Calcium: 10 mg/dL (ref 8.4–10.5)
Chloride: 104 mEq/L (ref 96–112)
Creatinine, Ser: 0.9 mg/dL (ref 0.40–1.50)
GFR: 80.11 mL/min (ref >60.00)
Glucose, Bld: 118 mg/dL — ABNORMAL HIGH (ref 70–99)
Potassium: 4.4 mEq/L (ref 3.5–5.1)
Sodium: 140 mEq/L (ref 135–145)
Total Bilirubin: 1.1 mg/dL (ref 0.2–1.2)
Total Protein: 7.3 g/dL (ref 6.0–8.3)

## 2021-12-16 LAB — CBC WITH DIFFERENTIAL/PLATELET
Basophils Absolute: 0 10*3/uL (ref 0.0–0.1)
Basophils Relative: 0.5 % (ref 0.0–3.0)
Eosinophils Absolute: 0.1 10*3/uL (ref 0.0–0.7)
Eosinophils Relative: 0.8 % (ref 0.0–5.0)
HCT: 45.5 % (ref 39.0–52.0)
Hemoglobin: 15.2 g/dL (ref 13.0–17.0)
Lymphocytes Relative: 32 % (ref 12.0–46.0)
Lymphs Abs: 2.3 10*3/uL (ref 0.7–4.0)
MCHC: 33.5 g/dL (ref 30.0–36.0)
MCV: 97.3 fl (ref 78.0–100.0)
Monocytes Absolute: 0.5 10*3/uL (ref 0.1–1.0)
Monocytes Relative: 6.7 % (ref 3.0–12.0)
Neutro Abs: 4.2 10*3/uL (ref 1.4–7.7)
Neutrophils Relative %: 60 % (ref 43.0–77.0)
Platelets: 181 10*3/uL (ref 150.0–400.0)
RBC: 4.68 Mil/uL (ref 4.22–5.81)
RDW: 13.4 % (ref 11.5–15.5)
WBC: 7.1 10*3/uL (ref 4.0–10.5)

## 2021-12-16 LAB — URINALYSIS, ROUTINE W REFLEX MICROSCOPIC
Bilirubin Urine: NEGATIVE
Hgb urine dipstick: NEGATIVE
Ketones, ur: NEGATIVE
Leukocytes,Ua: NEGATIVE
Nitrite: NEGATIVE
RBC / HPF: NONE SEEN (ref 0–?)
Specific Gravity, Urine: >=1.03 — AB (ref 1.000–1.030)
Total Protein, Urine: NEGATIVE
Urine Glucose: NEGATIVE
Urobilinogen, UA: 0.2 (ref 0.0–1.0)
pH: 5.5 (ref 5.0–8.0)

## 2021-12-16 LAB — LIPASE: Lipase: 39 U/L (ref 11.0–59.0)

## 2021-12-16 LAB — LIPID PANEL
Cholesterol: 112 mg/dL (ref 0–200)
HDL: 36.1 mg/dL — ABNORMAL LOW (ref >39.00)
NonHDL: 76.11
Total CHOL/HDL Ratio: 3
Triglycerides: 220 mg/dL — ABNORMAL HIGH (ref 0.0–149.0)
VLDL: 44 mg/dL — ABNORMAL HIGH (ref 0.0–40.0)

## 2021-12-16 LAB — LDL CHOLESTEROL, DIRECT: Direct LDL: 67 mg/dL

## 2021-12-16 LAB — HEMOGLOBIN A1C: Hgb A1c MFr Bld: 6.4 % (ref 4.6–6.5)

## 2021-12-16 NOTE — Progress Notes (Signed)
Tikosyn is really expensive for patient.  I told him I would check with staff here in clinic. ? ?Stool changes, watery vs pellets.  No red blood in stool.  Occ had a black stool but not consistently.  Left hip pain.  No burning with urination.  No blood urine.  Pain has been going on for 6 months, gradually getting worse.  Prev lower abd, now higher in the mid abdomen.  Intermittent.  No vomiting.  No nausea.   ? ?Now with L lower back and L hip pain.  He has some weight loss attributed to diet change, avoid carbs and fried foods.  Stream is initially good but then slows midstream.   ? ?Meds, vitals, and allergies reviewed.  ? ?ROS: Per HPI unless specifically indicated in ROS section  ? ?Nad ?Ncat ?Neck supple, no LA ?ctab ?IRR.  Not tachy ?Lower abd ttp, near and below umbilicus.  No rebound.  Normal bowel sounds. ?Skin well perfused.  No jaundice.  No edema.  No bruising. ?Normal hip range of motion.  Able to bear weight. ?

## 2021-12-16 NOTE — Patient Instructions (Signed)
Go to the lab on the way out.   If you have mychart we'll likely use that to update you.    ?Take care.  Glad to see you. ?Let me check on options with dofetilide.   ?

## 2021-12-19 ENCOUNTER — Telehealth: Payer: Self-pay | Admitting: Family Medicine

## 2021-12-19 DIAGNOSIS — R109 Unspecified abdominal pain: Secondary | ICD-10-CM

## 2021-12-19 NOTE — Assessment & Plan Note (Signed)
Referral placed to try to get patient help with Tikosyn. ?

## 2021-12-19 NOTE — Telephone Encounter (Addendum)
Order pended regarding GI referral. ?

## 2021-12-19 NOTE — Assessment & Plan Note (Addendum)
Unclear source.  Still okay for outpatient follow-up.  We talked about checking routine labs related to diabetes as we are collecting labs related to abdominal pain.  No change in medications yet.  See notes on labs.  Unclear if his hip and back pain is related. ?

## 2021-12-21 ENCOUNTER — Telehealth: Payer: Self-pay

## 2021-12-21 NOTE — Chronic Care Management (AMB) (Signed)
? ? ?  Chronic Care Management ?Pharmacy Assistant  ? ?Name: [name] [name]  MRN: [id number] DOB: 28-Jun-1941 ? ? ?Reason for Encounter: appointment call  ?  ?Conditions to be addressed/monitored: ?Atrial Fibrillation, HLD, and DMII ? ? ?Recent office visits:  ?12/16/21-Graham Duncan,MD(PCP)-f/u abdomen pain, no medication changes. Tikosyn cost barrier for the patient.New las ordered(Labs are reassuring overall.  His triglycerides are slightly elevated but his lipids are good otherwise,His kidney and liver tests are fine.  His blood counts are normal,his pancreatic test is normal, A1c 6.4. ?Let me check on options with dofetilide.   ? ?Recent consult visits:  ?11/04/21-Anthony Kuo,MD(ophthalomology)- no evidence of diabetic retinopathy. ?04/20/21-Timothy Gollan,MD(cardio)- AFIB and CAD follow up- no medication changes, follow up 12 months ? ?Hospital visits:  ?None in previous 6 months ? ?Medications: ?Outpatient Encounter Medications as of 12/21/2021  ?Medication Sig  ? acyclovir (ZOVIRAX) 800 MG tablet Take 1 tablet (800 mg total) by mouth 2 (two) times daily.  ? amLODipine (NORVASC) 10 MG tablet Take 1 tablet (10 mg total) by mouth daily.  ? apixaban (ELIQUIS) 5 MG TABS tablet Take 1 tablet (5 mg total) by mouth 2 (two) times daily.  ? COD LIVER OIL PO Take 1 capsule by mouth 2 (two) times daily. 10 mcg  ? dofetilide (TIKOSYN) 500 MCG capsule Take 1 capsule (500 mcg total) by mouth 2 (two) times daily.  ? ezetimibe (ZETIA) 10 MG tablet Take 1 tablet (10 mg total) by mouth daily.  ? gabapentin (NEURONTIN) 600 MG tablet Take 0.5-1 tablets (300-600 mg total) by mouth 3 (three) times daily as needed.  ? loratadine (CLARITIN) 10 MG tablet Take 1 tablet (10 mg total) by mouth daily.  ? losartan (COZAAR) 100 MG tablet Take 1 tablet (100 mg total) by mouth daily.  ? metFORMIN (GLUCOPHAGE) 850 MG tablet TAKE 1 TABLET TWICE A DAY WITH MEALS  ? nitroGLYCERIN (NITROSTAT) 0.4 MG SL tablet Place 1 tablet (0.4 mg total) under the tongue  every 5 (five) minutes as needed for chest pain (max 3 doses).  ? Omega-3 Fatty Acids (SALMON OIL PO) Take 1,000 mg by mouth. 1 twice daily  ? timolol (BETIMOL) 0.5 % ophthalmic solution Place 1 drop into both eyes 2 (two) times daily.  ? valACYclovir (VALTREX) 1000 MG tablet Take 1 tablet (1,000 mg total) by mouth 2 (two) times daily as needed.  ? ?No facility-administered encounter medications on file as of 12/21/2021.  ? ? ?[name] [name] was contacted to remind of upcoming telephone visit with Charlene Brooke on 12/30/21 at 9:30am. Patient was reminded to have any blood glucose and blood pressure readings available for review at appointment.  ? ?Patient confirmed appointment. ? ? ?Are you having any problems with your medications? No  ? ?Do you have any concerns you like to discuss with the pharmacist? Yes cost is a barrier for the patient with the [facility]. ? ? ?CCM referral has been placed prior to visit?  Yes  ? ? ?Star Rating Drugs: ?Medication:  Last Fill: Day Supply ?Metformin '850mg'$  12/13/21  90 ?Losartan '100mg'$  12/09/21  90 ?Charlene Brooke, CPP notified ? ?Richardson Dubree, CCMA ?Health concierge  ?[phone]  ?

## 2021-12-22 NOTE — Telephone Encounter (Signed)
Referral signed.

## 2021-12-24 ENCOUNTER — Telehealth: Payer: Self-pay

## 2021-12-24 NOTE — Progress Notes (Signed)
Chronic Care Management Pharmacy Assistant   Name: [name] [name]  MRN: [id number] DOB: 1941/02/24  Reason for Encounter: CCM (Appointment Reminder)  Recent office visits:  12/16/21 [name] Stain, MD DM: Abnormal Labs: " Labs are reassuring overall.  His triglycerides are slightly elevated but his lipids are good otherwise.  Not diabetic based on A1c, though he is borderline diabetic.  He has some calcium oxalate crystals in his urine, those are commonly seen in patients with a history of kidney stones but sometimes seen even when the patient does not have stones currently.  His kidney and liver tests are fine.  His blood counts are normal.  His pancreatic test is normal.  Given his stool changes I think we should have him see the GI clinic."  Stop all due to patient not taking: LOTRISONE cream and BACTROBAN) 2 %.  Referral to GI. 12/14/21 AWV  Recent consult visits:  11/04/2021 Diabetic eye exam  Hospital visits:  None in previous 6 months  Medications: Outpatient Encounter Medications as of 12/24/2021  Medication Sig   acyclovir (ZOVIRAX) 800 MG tablet Take 1 tablet (800 mg total) by mouth 2 (two) times daily.   amLODipine (NORVASC) 10 MG tablet Take 1 tablet (10 mg total) by mouth daily.   apixaban (ELIQUIS) 5 MG TABS tablet Take 1 tablet (5 mg total) by mouth 2 (two) times daily.   COD LIVER OIL PO Take 1 capsule by mouth 2 (two) times daily. 10 mcg   dofetilide (TIKOSYN) 500 MCG capsule Take 1 capsule (500 mcg total) by mouth 2 (two) times daily.   ezetimibe (ZETIA) 10 MG tablet Take 1 tablet (10 mg total) by mouth daily.   gabapentin (NEURONTIN) 600 MG tablet Take 0.5-1 tablets (300-600 mg total) by mouth 3 (three) times daily as needed.   loratadine (CLARITIN) 10 MG tablet Take 1 tablet (10 mg total) by mouth daily.   losartan (COZAAR) 100 MG tablet Take 1 tablet (100 mg total) by mouth daily.   metFORMIN (GLUCOPHAGE) 850 MG tablet TAKE 1 TABLET TWICE A DAY WITH MEALS    nitroGLYCERIN (NITROSTAT) 0.4 MG SL tablet Place 1 tablet (0.4 mg total) under the tongue every 5 (five) minutes as needed for chest pain (max 3 doses).   Omega-3 Fatty Acids (SALMON OIL PO) Take 1,000 mg by mouth. 1 twice daily   timolol (BETIMOL) 0.5 % ophthalmic solution Place 1 drop into both eyes 2 (two) times daily.   valACYclovir (VALTREX) 1000 MG tablet Take 1 tablet (1,000 mg total) by mouth 2 (two) times daily as needed.   No facility-administered encounter medications on file as of 12/24/2021.   [name] [name] was contacted to remind of upcoming telephone visit with [name] [name] on 12/30/2021 at 9:30. Patient was reminded to have any blood glucose and blood pressure readings available for review at appointment.   Patient confirmed appointment.  Are you having any problems with your medications? No   Do you have any concerns you like to discuss with the pharmacist? No  CCM referral has been placed prior to visit?  Yes   Star Rating Drugs: Medication:  Last Fill: Day Supply Metformin '850mg'$         12/13/21              90 Losartan '100mg'$           12/09/21              [name] [name], CPP notified  [name] [name]  [name] [name], [place] Pharmacy Assistant [phone]

## 2021-12-27 ENCOUNTER — Telehealth: Payer: Self-pay

## 2021-12-27 NOTE — Progress Notes (Signed)
/     Chronic Care Management Pharmacy Assistant   Name: [name] [name]  MRN: [id number] DOB: 1940-11-06  Reason for Encounter: CCM (Appointment Reminder)  Recent office visits:  12/16/21 [name], MD DM: Abnormal Labs: "His triglycerides are slightly elevated but his lipids are good otherwise.  Not diabetic based on A1c, though he is borderline diabetic.  He has some calcium oxalate crystals in his urine, those are commonly seen in patients with a history of kidney stones but sometimes seen even when the patient does not have stones currently.  His kidney and liver tests are fine.  His blood counts are normal.  His pancreatic test is normal.  Given his stool changes I think we should have him see the GI clinic."  Stop (patient): LOTRISONE cream and BACTROBAN 2 %. No med changes. Referral to GI.  12/14/21 AWV  Recent consult visits:  11/04/21 Rande Lawman (Opthalmology): Diabetic eye exam  Hospital visits:  None in previous 6 months  Medications: Outpatient Encounter Medications as of 12/27/2021  Medication Sig   acyclovir (ZOVIRAX) 800 MG tablet Take 1 tablet (800 mg total) by mouth 2 (two) times daily.   amLODipine (NORVASC) 10 MG tablet Take 1 tablet (10 mg total) by mouth daily.   apixaban (ELIQUIS) 5 MG TABS tablet Take 1 tablet (5 mg total) by mouth 2 (two) times daily.   COD LIVER OIL PO Take 1 capsule by mouth 2 (two) times daily. 10 mcg   dofetilide (TIKOSYN) 500 MCG capsule Take 1 capsule (500 mcg total) by mouth 2 (two) times daily.   ezetimibe (ZETIA) 10 MG tablet Take 1 tablet (10 mg total) by mouth daily.   gabapentin (NEURONTIN) 600 MG tablet Take 0.5-1 tablets (300-600 mg total) by mouth 3 (three) times daily as needed.   loratadine (CLARITIN) 10 MG tablet Take 1 tablet (10 mg total) by mouth daily.   losartan (COZAAR) 100 MG tablet Take 1 tablet (100 mg total) by mouth daily.   metFORMIN (GLUCOPHAGE) 850 MG tablet TAKE 1 TABLET TWICE A DAY WITH MEALS   nitroGLYCERIN  (NITROSTAT) 0.4 MG SL tablet Place 1 tablet (0.4 mg total) under the tongue every 5 (five) minutes as needed for chest pain (max 3 doses).   Omega-3 Fatty Acids (SALMON OIL PO) Take 1,000 mg by mouth. 1 twice daily   timolol (BETIMOL) 0.5 % ophthalmic solution Place 1 drop into both eyes 2 (two) times daily.   valACYclovir (VALTREX) 1000 MG tablet Take 1 tablet (1,000 mg total) by mouth 2 (two) times daily as needed.   No facility-administered encounter medications on file as of 12/27/2021.   [name] was contacted to remind of upcoming telephone visit with Charlene Brooke on 12/30/21 at 9:30. Patient was reminded to have any blood glucose and blood pressure readings available for review at appointment.   Patient confirmed appointment.  Are you having any problems with your medications? No   Do you have any concerns you like to discuss with the pharmacist? No  CCM referral has been placed prior to visit?  Yes   Star Rating Drugs: Medication:  Last Fill: Day Supply Metformin 850 mg 12/13/2021 90 Losartan 100 mg 12/09/2021 [place], CPP notified  Marijean Niemann, [place] Clinical Pharmacy Assistant [phone]

## 2021-12-30 ENCOUNTER — Ambulatory Visit (INDEPENDENT_AMBULATORY_CARE_PROVIDER_SITE_OTHER): Payer: Medicare Other | Admitting: Pharmacist

## 2021-12-30 DIAGNOSIS — E1149 Type 2 diabetes mellitus with other diabetic neurological complication: Secondary | ICD-10-CM

## 2021-12-30 DIAGNOSIS — I4891 Unspecified atrial fibrillation: Secondary | ICD-10-CM

## 2021-12-30 DIAGNOSIS — E7800 Pure hypercholesterolemia, unspecified: Secondary | ICD-10-CM

## 2021-12-30 DIAGNOSIS — I10 Essential (primary) hypertension: Secondary | ICD-10-CM

## 2021-12-30 NOTE — Progress Notes (Signed)
Chronic Care Management Pharmacy Note  12/30/2021 Name:  [name] [name] MRN:  [id number] DOB:  Apr 11, 1941  Summary: CCM F/U visit -Reviewed medications: pt affirms adherence as prescribed -Pt reports Eliquis and Dofetilide are costly; unfortunately he will not qualify for any Eliquis assistance; we can attempt a tier exception for dofetilide  Recommendations/Changes made from today's visit: -Submitted tier exception request for Dofetilide. Will await determination  Plan: -[place] will call patient with tier exception response -Pharmacist follow up televisit scheduled for 1 year    Subjective: [name] [name] is an 80 y.o. year old male who is a primary patient of Damita Dunnings, Elveria Rising, MD.  The CCM team was consulted for assistance with disease management and care coordination needs.    Engaged with patient by telephone for follow up visit in response to provider referral for pharmacy case management and/or care coordination services.   Consent to Services:  The patient was given information about Chronic Care Management services, agreed to services, and gave verbal consent prior to initiation of services.  Please see initial visit note for detailed documentation.   Patient Care Team: Tonia Ghent, MD as PCP - General (Family Medicine) Burnell Blanks, MD as Consulting Physician (Cardiology) Clarene Critchley, MD as Consulting Physician (Ophthalmology) Sydnee Levans, MD as Consulting Physician (Dermatology) Minna Merritts, MD as Consulting Physician (Cardiology) Charlton Haws, [facility] as Pharmacist (Pharmacist)  Recent office visits: 12/16/21 Dr Damita Dunnings OV: f/u - Tikosyn costly. Stool changes. Lower back/hip pain. Labs stable/wnl, refer to GI.  Recent consult visits: 11/04/21 Rande Lawman (Opthalmology): Diabetic eye exam 04/20/21 Dr Rockey Situ (Cardiology): f/u Afib, CAD. No med changes.  Hospital visits: None in previous 6 months   Objective:  Lab  Results  Component Value Date   CREATININE 0.90 12/16/2021   BUN 22 12/16/2021   GFR 80.11 12/16/2021   EGFR 89 04/27/2021   GFRNONAA >60 09/28/2017   GFRAA >60 09/28/2017   NA 140 12/16/2021   K 4.4 12/16/2021   CALCIUM 10.0 12/16/2021   CO2 29 12/16/2021   GLUCOSE 118 (H) 12/16/2021    Lab Results  Component Value Date/Time   HGBA1C 6.4 12/16/2021 12:55 PM   HGBA1C 6.6 (A) 05/21/2020 08:39 AM   HGBA1C 6.4 04/16/2020 12:25 PM   GFR 80.11 12/16/2021 12:55 PM   GFR 91.73 04/16/2020 12:25 PM   MICROALBUR 1.3 06/02/2014 10:30 AM    Last diabetic Eye exam:  Lab Results  Component Value Date/Time   HMDIABEYEEXA No Retinopathy 06/01/2020 12:00 AM    Last diabetic Foot exam: No results found for: HMDIABFOOTEX   Lab Results  Component Value Date   CHOL 112 12/16/2021   HDL 36.10 (L) 12/16/2021   LDLCALC 62 06/07/2017   LDLDIRECT 67.0 12/16/2021   TRIG 220.0 (H) 12/16/2021   CHOLHDL 3 12/16/2021       Latest Ref Rng & Units 12/16/2021   12:55 PM 04/16/2020   12:25 PM 10/29/2019    7:44 AM  Hepatic Function  Total Protein 6.0 - 8.3 g/dL 7.3   6.9   6.8    Albumin 3.5 - 5.2 g/dL 4.6   4.5   4.7    AST 0 - 37 U/L 18   15   22     ALT 0 - 53 U/L 13   11   17     Alk Phosphatase 39 - 117 U/L 85   72   72    Total Bilirubin 0.2 -  1.2 mg/dL 1.1   1.1   1.6      Lab Results  Component Value Date/Time   TSH 1.80 10/29/2019 07:44 AM   TSH 1.65 09/13/2018 08:11 AM       Latest Ref Rng & Units 12/16/2021   12:55 PM 04/16/2020   12:25 PM 10/29/2019    7:44 AM  CBC  WBC 4.0 - 10.5 K/uL 7.1   6.8   7.4    Hemoglobin 13.0 - 17.0 g/dL 15.2   15.3   15.5    Hematocrit 39.0 - 52.0 % 45.5   45.4   45.4    Platelets 150.0 - 400.0 K/uL 181.0   163.0   165.0      Lab Results  Component Value Date/Time   VD25OH 35.67 01/18/2017 01:16 PM    Clinical ASCVD: Yes  The ASCVD Risk score (Arnett DK, et al., 2019) failed to calculate for the following reasons:   The 2019 ASCVD risk score  is only valid for ages 12 to 63   The patient has a prior MI or stroke diagnosis       12/14/2021   11:50 AM 07/23/2020   11:24 AM 07/16/2020   10:49 AM  Depression screen PHQ 2/9  Decreased Interest 0 0 0  Down, Depressed, Hopeless 0 0 0  PHQ - 2 Score 0 0 0     CHA2DS2/VAS Stroke Risk Points  Current as of 3 days ago     6 >= 2 Points: High Risk  1 - 1.99 Points: Medium Risk  0 Points: Low Risk    Last Change: N/A     Points Metrics  0 Has Congestive Heart Failure:  No    Current as of 3 days ago  0 Has Vascular Disease:  No    Current as of 3 days ago  1 Has Hypertension:  Yes    Current as of 3 days ago  2 Age:  33    Current as of 3 days ago  1 Has Diabetes:  Yes    Current as of 3 days ago  2 Had Stroke:  Yes  Had TIA:  No  Had Thromboembolism:  No    Current as of 3 days ago  0 Male:  No    Current as of 3 days ago    Social History   Tobacco Use  Smoking Status Never  Smokeless Tobacco Never   BP Readings from Last 3 Encounters:  12/16/21 138/78  04/20/21 (!) 150/60  03/23/21 140/60   Pulse Readings from Last 3 Encounters:  12/16/21 61  04/20/21 (!) 54  03/23/21 63   Wt Readings from Last 3 Encounters:  12/16/21 152 lb (68.9 kg)  04/20/21 159 lb 8 oz (72.3 kg)  03/23/21 160 lb (72.6 kg)   BMI Readings from Last 3 Encounters:  12/16/21 23.81 kg/m  04/20/21 24.98 kg/m  03/23/21 25.06 kg/m    Assessment/Interventions: Review of patient past medical history, allergies, medications, health status, including review of consultants reports, laboratory and other test data, was performed as part of comprehensive evaluation and provision of chronic care management services.   SDOH:  (Social Determinants of Health) assessments and interventions performed: No - Done 12/2021  SDOH Screenings   Alcohol Screen: Low Risk    Last Alcohol Screening Score (AUDIT): 0  Depression (PHQ2-9): Low Risk    PHQ-2 Score: 0  Financial Resource Strain: Low Risk     Difficulty of Paying Living Expenses:  Not hard at all  Food Insecurity: No Food Insecurity   Worried About Charity fundraiser in the Last Year: Never true   Ran Out of Food in the Last Year: Never true  Housing: Low Risk    Last Housing Risk Score: 0  Physical Activity: Sufficiently Active   Days of Exercise per Week: 5 days   Minutes of Exercise per Session: 60 min  Social Connections: Moderately Integrated   Frequency of Communication with Friends and Family: Three times a week   Frequency of Social Gatherings with Friends and Family: Twice a week   Attends Religious Services: Never   Marine scientist or Organizations: Yes   Attends Music therapist: More than 4 times per year   Marital Status: Married  Stress: No Stress Concern Present   Feeling of Stress : Not at all  Tobacco Use: Low Risk    Smoking Tobacco Use: Never   Smokeless Tobacco Use: Never   Passive Exposure: Not on file  Transportation Needs: No Transportation Needs   Lack of Transportation (Medical): No   Lack of Transportation (Non-Medical): No    CCM Care Plan  Allergies  Allergen Reactions   Fenofibrate Other (See Comments)    Muscle weakness/pain   Fire Ant     Allergic reaction   Statins Other (See Comments)    MYALGIA, muscle pain    Medications Reviewed Today     Reviewed by Filbert Berthold, CMA (Certified Medical Assistant) on 12/16/21 at 1215  Med List Status: <None>   Medication Order Taking? Sig Documenting Provider Last Dose Status Informant  acyclovir (ZOVIRAX) 800 MG tablet [id number] Yes Take 1 tablet (800 mg total) by mouth 2 (two) times daily. Tonia Ghent, MD Taking Active   amLODipine (NORVASC) 10 MG tablet [id number]  Take 1 tablet (10 mg total) by mouth daily. Minna Merritts, MD  Expired 12/14/21 2359   apixaban (ELIQUIS) 5 MG TABS tablet [id number] Yes Take 1 tablet (5 mg total) by mouth 2 (two) times daily. Minna Merritts, MD Taking Active   COD LIVER  OIL PO [id number] Yes Take 1 capsule by mouth 2 (two) times daily. 10 mcg [provider] Taking Active Self  dofetilide (TIKOSYN) 500 MCG capsule [id number] Yes Take 1 capsule (500 mcg total) by mouth 2 (two) times daily. Evans Lance, MD Taking Active   ezetimibe (ZETIA) 10 MG tablet [id number] Yes Take 1 tablet (10 mg total) by mouth daily. Minna Merritts, MD Taking Active   gabapentin (NEURONTIN) 600 MG tablet [id number] Yes Take 0.5-1 tablets (300-600 mg total) by mouth 3 (three) times daily as needed. Tonia Ghent, MD Taking Active   loratadine (CLARITIN) 10 MG tablet [id number] Yes Take 1 tablet (10 mg total) by mouth daily. Tonia Ghent, MD Taking Active   losartan (COZAAR) 100 MG tablet [id number] Yes Take 1 tablet (100 mg total) by mouth daily. Minna Merritts, MD Taking Active   metFORMIN (GLUCOPHAGE) 850 MG tablet [id number] Yes TAKE 1 TABLET TWICE A DAY WITH MEALS Tonia Ghent, MD Taking Active   nitroGLYCERIN (NITROSTAT) 0.4 MG SL tablet [id number] Yes Place 1 tablet (0.4 mg total) under the tongue every 5 (five) minutes as needed for chest pain (max 3 doses). Minna Merritts, MD Taking Active   Omega-3 Fatty Acids (SALMON OIL PO) [id number] Yes Take 1,000 mg by mouth. 1 twice daily [provider] Taking Active Self  timolol (BETIMOL) 0.5 %  ophthalmic solution [id number] Yes Place 1 drop into both eyes 2 (two) times daily. [provider] Taking Active   valACYclovir (VALTREX) 1000 MG tablet [id number] Yes Take 1 tablet (1,000 mg total) by mouth 2 (two) times daily as needed. Tonia Ghent, MD Taking Active             Patient Active Problem List   Diagnosis Date Noted   Abdominal pain 12/19/2021   Effusion of right olecranon bursa 02/18/2021   Bradycardia 10/19/2020   Sciatica 07/08/2020   Diarrhea 04/19/2020   Hand laceration 12/29/2019   Skin lesion 12/29/2019   Allergy to ant bite 11/27/2019   Vertigo 03/04/2019   Hand abrasion  10/28/2018   Snoring 10/14/2018   Tongue abnormality 10/14/2018   Persistent atrial fibrillation ([place]) 09/18/2017   Hearing loss 08/27/2017   Glaucoma 07/23/2017   Stroke ([place]) 04/11/2017   Myopathy 03/23/2017   Weakness 01/18/2017   Arm pain 08/05/2016   Prostate cancer screening 08/04/2016   HTN (hypertension) 08/04/2016   Rectal pain 02/18/2016   Plantar fasciitis 02/18/2016   AK (actinic keratosis) 02/02/2016   Anxiety state 02/19/2015   Exertional chest pain 02/19/2015   Advance care planning 07/15/2014   Atrial fibrillation ([place]) 08/28/2013   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   Unstable angina ([facility]) 08/21/2013   Angina, 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/19/2013   Lower abdominal pain 04/24/2013   Osteoporosis 06/16/2012   Medicare annual wellness visit, subsequent 06/12/2012   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   Neck pain 12/12/2011   Chest pain 11/29/2010   Hypercholesteremia 11/29/2010   Murmur 11/29/2010    Immunization History  Administered Date(s) Administered   Fluad Quad(high Dose 65+) 05/21/2020   Influenza Split 06/11/2012, 06/08/2014   Influenza,inj,Quad PF,6+ Mos 07/24/2015, 05/31/2018   Influenza-Unspecified 05/08/2016, 05/19/2017, 05/03/2021   Moderna Covid-19 Vaccine Bivalent Booster 12yr & up 05/18/2021   Moderna Sars-Covid-2 Vaccination 08/19/2019, 09/17/2019, 06/05/2020, 11/24/2020   Pneumococcal Conjugate-13 07/14/2014   Pneumococcal Polysaccharide-23 06/11/2012   Td 08/08/2008   Tdap 10/26/2018    Conditions to be addressed/monitored:  Hypertension, Hyperlipidemia, Diabetes, Atrial Fibrillation, and Coronary Artery Disease  Care Plan : [place] Updates made by FCharlton Haws RTiptonsince 12/30/2021 12:00 AM     Problem: Hypertension, Hyperlipidemia, Diabetes, Atrial Fibrillation, and Coronary Artery Disease   Priority: High     Long-Range Goal: Disease mgmt   Start Date: 12/30/2021  Expected End Date: 12/31/2022  This  Visit's Progress: On track  Priority: High  Note:   Current Barriers:  High cost medications (Eliquis, Dofetilide)  Pharmacist Clinical Goal(s):  Patient will verbalize ability to afford treatment regimen through collaboration with PharmD and provider.   Interventions: 1:1 collaboration with DTonia Ghent MD regarding development and update of comprehensive plan of care as evidenced by provider attestation and co-signature Inter-disciplinary care team collaboration (see longitudinal plan of care) Comprehensive medication review performed; medication list updated in electronic medical record  Hypertension (BP goal <140/90) -Controlled - per recent office visits -Current treatment: Amlodipine 10 mg daily (may take an addition 5 mg for SBP > 150-160) -Appropriate, Effective, Safe, Accessible Losartan 100 mg daily -Appropriate, Effective, Safe, Accessible -Medications previously tried: carvedilol (presyncope), HCTZ (dizziness), hydralazine (tachycardia) -Recommend continue current medications  Hyperlipidemia/ CAD (LDL goal < 70) -Controlled - LDL 67 (12/2021) at goal, TRIG 220 elevated but adequate -Hx CAD, CABG X 6 -Current treatment: Ezetimibe 10 mg daily -Appropriate, Effective, Safe, Accessible Cod  Liver 10 mcg twice daily -Appropriate, Effective, Safe, Accessible Salmon oil 1000 mg twice daily -Appropriate, Effective, Safe, Accessible Nitroglycerin 0.4 mg SL prn -Appropriate, Effective, Safe, Accessible -Medications previously tried: statins (simvastatin), fenofibrate  -Educated on Cholesterol goals; LDL within goal of < 70 -Recommended to continue current medication  Diabetes (A1c goal <7%) -Controlled - A1c 6.4% (12/2021) at goal, in prediabetic range -Current medications: Metformin 850 mg BID - Appropriate, Effective, Safe, Accessible -Medications previously tried: metformin 1000 mg BID (GI) -Recommended to continue current medication  Atrial Fibrillation (Goal: prevent  stroke and major bleeding) -Controlled - per cardiology; pt reports dofetilide was expensive but he uses a coupon at a separate pharmacy for cost savings outside of his insurance; he reports Eliquis is also expensive but no assistance is available - per pt his income is too high for PAP -CHADSVASC: 6 -Current treatment: Dofetilide 500 mcg BID - Appropriate, Effective, Safe, Query Accessible Eliquis 5 mg BID -Appropriate, Effective, Safe, Query Accessible -Medications previously tried: carvedilol, amiodarone, metoprolol -Reviewed Eliquis cost savings program - patient will not qualify for BMS PAP, there is no other savings programs available -Reviewed dofetilide cost savings opportunites - it is likely Tier 3 or 4 with his plan, he has failed several preferred options for Afib so he should qualify for Tier exception - submitted to plan and will await determination -Recommended to continue current medication  Patient Goals/Self-Care Activities Patient will:  - take medications as prescribed as evidenced by patient report and record review focus on medication adherence by routine check blood pressure daily, document, and provide at future appointments collaborate with provider on medication access solutions      Medication Assistance:  Eliquis - pt will not qualify due to income Dofetilide - submitted tier exception 12/30/21  Compliance/Adherence/Medication fill history: Care Gaps: Foot exam (due 11/04/20) Eye exam (due 06/01/21) - done 11/04/21 per chart review (Dr Andreas Ohm)  Star-Rating Drugs: Losartan - PDC 100% Metformin - PDC unavailable. LF 12/13/21 x 90 ds  Medication Access: Within the past 30 days, how often has patient missed a dose of medication? 0 Is a pillbox or other method used to improve adherence? Yes  Factors that may affect medication adherence? no barriers identified Are meds synced by current pharmacy? No  Are meds delivered by current pharmacy? Yes  Does patient  experience delays in picking up medications due to transportation concerns? No   [facility] Reviewed: Is patient disadvantaged to use [facility]?: Yes  Current Rx insurance plan: [employer] [place] Name and location of Current pharmacy:  [facility] [place] [address] [place] [address] Phone: [phone] Fax: [phone]  [facility] services reviewed with patient today?: No  Patient requests to transfer care to [facility]?: No  Reason patient declined to change pharmacies: Disadvantaged due to insurance/mail order   Care Plan and Follow Up Patient Decision:  Patient agrees to Care Plan and Follow-up.  Plan: Telephone follow up appointment with care management team member scheduled for:  1 year  Charlene Brooke, PharmD, [facility] Clinical Pharmacist [facility] [phone]

## 2021-12-30 NOTE — Patient Instructions (Signed)
Visit Information  Phone number for Pharmacist: [phone]   Goals Addressed   None     Care Plan : [place]  Updates made by Charlton Haws, RPH since 12/30/2021 12:00 AM     Problem: Hypertension, Hyperlipidemia, Diabetes, Atrial Fibrillation, and Coronary Artery Disease   Priority: High     Long-Range Goal: Disease mgmt   Start Date: 12/30/2021  Expected End Date: 12/31/2022  This Visit's Progress: On track  Priority: High  Note:   Current Barriers:  High cost medications (Eliquis, Dofetilide)  Pharmacist Clinical Goal(s):  Patient will verbalize ability to afford treatment regimen through collaboration with PharmD and provider.   Interventions: 1:1 collaboration with Tonia Ghent, MD regarding development and update of comprehensive plan of care as evidenced by provider attestation and co-signature Inter-disciplinary care team collaboration (see longitudinal plan of care) Comprehensive medication review performed; medication list updated in electronic medical record  Hypertension (BP goal <140/90) -Controlled - per recent office visits -Current treatment: Amlodipine 10 mg daily (may take an addition 5 mg for SBP > 150-160) -Appropriate, Effective, Safe, Accessible Losartan 100 mg daily -Appropriate, Effective, Safe, Accessible -Medications previously tried: carvedilol (presyncope), HCTZ (dizziness), hydralazine (tachycardia) -Recommend continue current medications  Hyperlipidemia/ CAD (LDL goal < 70) -Controlled - LDL 67 (12/2021) at goal, TRIG 220 elevated but adequate -Hx CAD, CABG 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 -Current treatment: Ezetimibe 10 mg daily -Appropriate, Effective, Safe, Accessible Cod Liver 10 mcg twice daily -Appropriate, Effective, Safe, Accessible Salmon oil 1000 mg twice daily -Appropriate, Effective, Safe, Accessible Nitroglycerin 0.4 mg SL prn -Appropriate, Effective, Safe, Accessible -Medications previously tried: statins (simvastatin),  fenofibrate  -Educated on Cholesterol goals; LDL within goal of < 70 -Recommended to continue current medication  Diabetes (A1c goal <7%) -Controlled - A1c 6.4% (12/2021) at goal, in prediabetic range -Current medications: Metformin 850 mg BID - Appropriate, Effective, Safe, Accessible -Medications previously tried: metformin 1000 mg BID (GI) -Recommended to continue current medication  Atrial Fibrillation (Goal: prevent stroke and major bleeding) -Controlled - per cardiology; pt reports dofetilide was expensive but he uses a coupon at a separate pharmacy for cost savings outside of his insurance; he reports Eliquis is also expensive but no assistance is available - per pt his income is too high for PAP -CHADSVASC: 6 -Current treatment: Dofetilide 500 mcg BID - Appropriate, Effective, Safe, Query Accessible Eliquis 5 mg BID -Appropriate, Effective, Safe, Query Accessible -Medications previously tried: carvedilol, amiodarone, metoprolol -Reviewed Eliquis cost savings program - patient will not qualify for BMS PAP, there is no other savings programs available -Reviewed dofetilide cost savings opportunites - it is likely Tier 3 or 4 with his plan, he has failed several preferred options for Afib so he should qualify for Tier exception - submitted to plan and will await determination -Recommended to continue current medication  Patient Goals/Self-Care Activities Patient will:  - take medications as prescribed as evidenced by patient report and record review focus on medication adherence by routine check blood pressure daily, document, and provide at future appointments collaborate with provider on medication access solutions      Patient verbalizes understanding of instructions and care plan provided today and agrees to view in [place]. Active MyChart status and patient understanding of how to access instructions and care plan via MyChart confirmed with patient.    Telephone follow up  appointment with pharmacy team member scheduled for: 1 year  Charlene Brooke, PharmD, [facility] Clinical Pharmacist [facility] [phone]

## 2021-12-31 ENCOUNTER — Telehealth: Payer: Self-pay

## 2021-12-31 NOTE — Progress Notes (Signed)
Patient has been made aware his Dofetilide tier exception was approved and can be filled at pharmacy of his choice.  Charlene Brooke, CPP notified  Marijean Niemann, [place] Clinical Pharmacy Assistant [phone]

## 2022-01-05 DIAGNOSIS — I2510 Atherosclerotic heart disease of native coronary artery without angina pectoris: Secondary | ICD-10-CM

## 2022-01-05 DIAGNOSIS — Z7984 Long term (current) use of oral hypoglycemic drugs: Secondary | ICD-10-CM

## 2022-01-05 DIAGNOSIS — I10 Essential (primary) hypertension: Secondary | ICD-10-CM

## 2022-01-05 DIAGNOSIS — E785 Hyperlipidemia, unspecified: Secondary | ICD-10-CM

## 2022-01-05 DIAGNOSIS — E1149 Type 2 diabetes mellitus with other diabetic neurological complication: Secondary | ICD-10-CM | POA: Diagnosis not present

## 2022-01-05 DIAGNOSIS — I4891 Unspecified atrial fibrillation: Secondary | ICD-10-CM

## 2022-01-21 ENCOUNTER — Telehealth: Payer: Self-pay | Admitting: Cardiovascular Disease

## 2022-01-21 DIAGNOSIS — N411 Chronic prostatitis: Secondary | ICD-10-CM | POA: Diagnosis not present

## 2022-01-21 NOTE — Telephone Encounter (Signed)
Pt c/o medication issue:  1. Name of Medication:   apixaban (ELIQUIS) 5 MG TABS tablet    2. How are you currently taking this medication (dosage and times per day)? As written   3. Are you having a reaction (difficulty breathing--STAT)? no  4. What is your medication issue?  Pt saw his urologist and he has an inflamed prostate. They would like to start him on meloxicam, but would like to ask Dr.Gollan's recommendation since the medication doesn't mix well with eliquis.

## 2022-01-25 ENCOUNTER — Telehealth: Payer: Self-pay | Admitting: Cardiovascular Disease

## 2022-01-25 NOTE — Telephone Encounter (Signed)
Will increase bleeding risk with Eliquis. If he takes it, should take for the shortest time  possible

## 2022-01-25 NOTE — Telephone Encounter (Signed)
New Message:     Patient have an inflamed prostate and wants to know if he can sop his Eliquis?    Pt c/o medication issue:  1. Name of Medication: Eliquis  2. How are you currently taking this medication (dosage and times per day)? 2 times a day  3. Are you having a reaction (difficulty breathing--STAT)?   4. What is your medication issue? Can he stop his Eliquis, because inflamed Prostate?is

## 2022-01-25 NOTE — Telephone Encounter (Signed)
Called patient. No answer. Left detailed message with pharmacists advice.   Advised patient that he may call or send MyChart message if further questions

## 2022-01-25 NOTE — Telephone Encounter (Signed)
Please see encounter dated 6/16. Closing this encounter.

## 2022-01-25 NOTE — Telephone Encounter (Signed)
Patient is following up regarding Eliquis inquiry. Please advise.

## 2022-01-31 ENCOUNTER — Ambulatory Visit: Payer: Medicare Other | Admitting: Internal Medicine

## 2022-01-31 ENCOUNTER — Encounter: Payer: Self-pay | Admitting: Internal Medicine

## 2022-01-31 VITALS — BP 118/56 | HR 67 | Ht 67.0 in | Wt 156.2 lb

## 2022-01-31 DIAGNOSIS — I4819 Other persistent atrial fibrillation: Secondary | ICD-10-CM

## 2022-01-31 DIAGNOSIS — I495 Sick sinus syndrome: Secondary | ICD-10-CM

## 2022-02-10 ENCOUNTER — Telehealth: Payer: Medicare Other

## 2022-03-03 ENCOUNTER — Telehealth: Payer: Self-pay

## 2022-03-03 NOTE — Progress Notes (Signed)
    Chronic Care Management Pharmacy Assistant   Name: [name] [name]  MRN: [id number] DOB: 04/04/1941  Reason for Encounter: CCM (Blood Pressure Reading)   Called patient for a blood pressure reading.   Date Blood Pressure 07/27 148/74  Charlene Brooke, CPP notified  Marijean Niemann, [place] Clinical Pharmacy Assistant [phone]

## 2022-03-24 ENCOUNTER — Other Ambulatory Visit: Payer: Self-pay | Admitting: Family Medicine

## 2022-04-22 ENCOUNTER — Telehealth: Payer: Self-pay

## 2022-04-22 NOTE — Progress Notes (Unsigned)
Chronic Care Management Pharmacy Assistant   Name: [name] [name]  MRN: [id number] DOB: 17-May-1941  Reason for Encounter: CCS (Blood Pressure Log)   Medications: Outpatient Encounter Medications as of 04/22/2022  Medication Sig   acyclovir (ZOVIRAX) 800 MG tablet Take 1 tablet (800 mg total) by mouth 2 (two) times daily.   amLODipine (NORVASC) 10 MG tablet Take 1 tablet (10 mg total) by mouth daily.   apixaban (ELIQUIS) 5 MG TABS tablet Take 1 tablet (5 mg total) by mouth 2 (two) times daily.   COD LIVER OIL PO Take 1 capsule by mouth 2 (two) times daily. 10 mcg   dofetilide (TIKOSYN) 500 MCG capsule Take 1 capsule (500 mcg total) by mouth 2 (two) times daily.   ezetimibe (ZETIA) 10 MG tablet Take 1 tablet (10 mg total) by mouth daily.   gabapentin (NEURONTIN) 600 MG tablet Take 0.5-1 tablets (300-600 mg total) by mouth 3 (three) times daily as needed.   loratadine (CLARITIN) 10 MG tablet Take 1 tablet (10 mg total) by mouth daily.   losartan (COZAAR) 100 MG tablet Take 1 tablet (100 mg total) by mouth daily.   meloxicam (MOBIC) 15 MG tablet Take 15 mg by mouth daily.   metFORMIN (GLUCOPHAGE) 850 MG tablet TAKE 1 TABLET TWICE A DAY WITH MEALS   nitroGLYCERIN (NITROSTAT) 0.4 MG SL tablet Place 1 tablet (0.4 mg total) under the tongue every 5 (five) minutes as needed for chest pain (max 3 doses).   Omega-3 Fatty Acids (SALMON OIL PO) Take 1,000 mg by mouth. 1 twice daily   tamsulosin (FLOMAX) 0.4 MG CAPS capsule Take 0.4 mg by mouth daily.   timolol (BETIMOL) 0.5 % ophthalmic solution Place 1 drop into both eyes 2 (two) times daily.   valACYclovir (VALTREX) 1000 MG tablet Take 1 tablet (1,000 mg total) by mouth 2 (two) times daily as needed.   No facility-administered encounter medications on file as of 04/22/2022.    Recent Office Vitals: BP Readings from Last 3 Encounters:  01/31/22 (!) 118/56  12/16/21 138/78  04/20/21 (!) 150/60   Pulse Readings from Last 3 Encounters:   01/31/22 67  12/16/21 61  04/20/21 (!) 54    Wt Readings from Last 3 Encounters:  01/31/22 156 lb 3.2 oz (70.9 kg)  12/16/21 152 lb (68.9 kg)  04/20/21 159 lb 8 oz (72.3 kg)     Kidney Function Lab Results  Component Value Date/Time   CREATININE 0.90 12/16/2021 12:55 PM   CREATININE 0.82 04/27/2021 09:02 AM   GFR 80.11 12/16/2021 12:55 PM   GFRNONAA >60 09/28/2017 10:00 AM   GFRAA >60 09/28/2017 10:00 AM       Latest Ref Rng & Units 12/16/2021   12:55 PM 04/27/2021    9:02 AM 04/16/2020   12:25 PM  BMP  Glucose 70 - 99 mg/dL 118  197  128   BUN 6 - 23 mg/dL '22  15  15   '$ Creatinine 0.40 - 1.50 mg/dL 0.90  0.82  0.81   BUN/Creat Ratio 10 - 24  18    Sodium 135 - 145 mEq/L 140  141  139   Potassium 3.5 - 5.1 mEq/L 4.4  4.8  4.2   Chloride 96 - 112 mEq/L 104  102  103   CO2 19 - 32 mEq/L '29  22  27   '$ Calcium 8.4 - 10.5 mg/dL 10.0  9.4  9.6    Contacted patient on 04/26/2022 to discuss hypertension disease  state  Current antihypertensive regimen:  Amlodipine 10 mg daily (may take an addition 5 mg for SBP > 150-160) Losartan 100 mg daily   Patient verbally confirms he is taking the above medications as directed. Yes  How often are you checking your Blood Pressure?  I have asked patient to take their blood pressure daily and keep a log. Advised patient I would call back on 04/29/2022 for log. Patient verbalized understanding and agreed.   Wrist or arm cuff: Arm Cuff Caffeine intake: Drinks 1 cup regular coffee morning Salt intake: Never adds salt to food. OTC medications including pseudoephedrine or NSAIDs? No  Any readings above 180/120? No  What recent interventions/DTPs have been made by any provider to improve Blood Pressure control since last CPP Visit: No recent interventions.   Any recent hospitalizations or ED visits since last visit with CPP? No  What diet changes have been made to improve Blood Pressure Control?  Wife cooks all his meals.   What exercise is  being done to improve your Blood Pressure Control?  Patient goes to the [facility] three times a week  Adherence Review: Is the patient currently on ACE/ARB medication? Yes Does the patient have >5 day gap between last estimated fill dates? Yes Losartan 100 mg 12/09/2021  Summary of recommendations from last [place] visit (Date:05/252023)  Summary: CCM F/U visit -Reviewed medications: pt affirms adherence as prescribed -Pt reports Eliquis and Dofetilide are costly; unfortunately he will not qualify for any Eliquis assistance; we can attempt a tier exception for dofetilide   Recommendations/Changes made from today's visit: -Submitted tier exception request for Dofetilide. Will await determination   Plan: -[place] will call patient with tier exception response -Pharmacist follow up televisit scheduled for 1 year   Star Rating Drugs:  Medication:  Last Fill: Day Supply Losartan 100 mg 12/09/2021 90 Metformin 850 mg 03/24/2022 90  Care Gaps: Annual wellness visit in last year? Yes 12/14/2021 Most Recent BP reading: 118/56 on 01/31/2022  If Diabetic: Most recent A1C reading: 6.4 on 12/16/2021 Last eye exam / retinopathy screening: 06/01/2020 Last diabetic foot exam: 01/05/2020  Upcoming appointments: CCM appointment on 11/25/2022  Charlene Brooke, CPP notified  Marijean Niemann, [place] [facility] [place] Assistant [phone]

## 2022-04-26 ENCOUNTER — Other Ambulatory Visit: Payer: Self-pay | Admitting: Cardiovascular Disease

## 2022-04-26 NOTE — Telephone Encounter (Signed)
Refill request

## 2022-04-26 NOTE — Telephone Encounter (Signed)
Prescription refill request for Eliquis received. Indication:Afib Last office visit:6/23 Scr:0.9 Age: 81 Weight:70.9 kg  Prescription refilled

## 2022-04-28 ENCOUNTER — Other Ambulatory Visit: Payer: Self-pay

## 2022-04-28 ENCOUNTER — Encounter: Payer: Self-pay | Admitting: Gastroenterology

## 2022-04-28 ENCOUNTER — Ambulatory Visit: Payer: Medicare Other | Admitting: Gastroenterology

## 2022-04-28 VITALS — BP 157/78 | HR 69 | Temp 97.9°F | Ht 67.0 in | Wt 151.8 lb

## 2022-04-28 DIAGNOSIS — K5900 Constipation, unspecified: Secondary | ICD-10-CM

## 2022-04-28 DIAGNOSIS — R1010 Upper abdominal pain, unspecified: Secondary | ICD-10-CM | POA: Diagnosis not present

## 2022-04-28 DIAGNOSIS — R634 Abnormal weight loss: Secondary | ICD-10-CM

## 2022-04-28 NOTE — Progress Notes (Signed)
Jonathon Bellows MD, MRCP(U.K) [address]  [place]  [place], [place] [address]  Main: [phone]  Fax: [phone]   Gastroenterology Consultation  Referring Provider:     Tonia Ghent, MD Primary Care Physician:  Tonia Ghent, MD Primary Gastroenterologist:  Dr. Jonathon Bellows  Reason for Consultation: Abdominal pain        HPI:   [name] [name] is a 81 y.o. y/o male referred for consultation & management  by Dr. Damita Dunnings, Elveria Rising, MD.     He has been referred for abdominal pain in May 2023.  He says that he has had abdominal pain for many years recently he has lost about 45 pounds of weight.  Points to his entire abdomen where he gets pain pretty much the whole day no clear aggravating factors sometimes relieved with a bowel movement.  He showed me pictures of his bowel movements it appears fragmented with very thin caliber.  Denies any blood in his stool.  Denies any NSAID use.  He does admit he has to strain at times during the day. No recent imaging of the abdomen known to have severe diverticulosis on CAT scan back in 2020 affecting the sigmoid colon.  03/10/2017 colonoscopy by Dr. Loletha Carrow showed a 10 mm polyp in the cecum multilobulated and sessile was resected piecemeal using a hot snare.  At that point of time he was thought to have possible constipation and related pain  12/16/2021 hemoglobin 15.2 g, CMP normal.   Past Medical History:  Diagnosis Date   Anxiety    Back pain    Coronary artery disease 08/21/2013   a. 08/2013 s/p CABG x 6.   Diabetes mellitus    Diabetic neuropathy (HCC)    Diastolic dysfunction    a. 02/2017 Echo: EF 55-60%, no rwma, Gr1 DD, mildly dil Ao Root/RV. + PFO.   Diverticulosis    Diverticulosis    Embolic stroke ([place])    a. 04/2017 MRI in setting of freq falls: small subacute infarction of bilat centrum semi-ovale consistent w/ embolic event-->Eliquis.   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 Had post-op AF 08/2013; b. 09/2015 Holter: PAC's no AF; c. 05/2017 Noted to be in AFib-->Eliquis (CHA2DS2VASc = 7); d. 08/2017 s/p DCCV.   PFO (patent foramen ovale)    a. 02/2017 Echo: + PFO.    Past Surgical History:  Procedure Laterality Date   CARDIAC CATHETERIZATION  08/21/2013   DR Angelena Form   CARDIOVERSION N/A 08/24/2017   Procedure: CARDIOVERSION;  Surgeon: Minna Merritts, MD;  Location: [facility];  Service: Cardiovascular;  Laterality: N/A;   [place] N/A 08/23/2013   Procedure: CORONARY ARTERY BYPASS GRAFTING (CABG);  Surgeon: Grace Isaac, MD;  Location: [place];  Service: Open Heart Surgery;  Laterality: N/A;  CABG times six utilizing the left internal mammary artery and the right greater saphenous vein harvested endoscopically   ENDOVEIN HARVEST OF GREATER SAPHENOUS VEIN Right 08/23/2013  Procedure: ENDOVEIN HARVEST OF GREATER SAPHENOUS VEIN;  Surgeon: Grace Isaac, MD;  Location: [place];  Service: Open Heart Surgery;  Laterality: Right;   HERNIA REPAIR     X3   INTRAOPERATIVE TRANSESOPHAGEAL ECHOCARDIOGRAM N/A 08/23/2013   Procedure: INTRAOPERATIVE TRANSESOPHAGEAL ECHOCARDIOGRAM;  Surgeon: Grace Isaac, MD;  Location: [place];  Service: Open Heart Surgery;  Laterality: N/A;   LEFT HEART CATHETERIZATION WITH CORONARY ANGIOGRAM N/A 08/21/2013   Procedure: LEFT HEART CATHETERIZATION WITH CORONARY ANGIOGRAM;  Surgeon: Burnell Blanks, MD;  Location: [facility] C[facility];  Service: Cardiovascular;  Laterality: N/A;    Prior to Admission medications   Medication Sig Start Date End Date Taking? Authorizing  Provider  acyclovir (ZOVIRAX) 800 MG tablet Take 1 tablet (800 mg total) by mouth 2 (two) times daily. 04/21/21   Tonia Ghent, MD  amLODipine (NORVASC) 10 MG tablet Take 1 tablet (10 mg total) by mouth daily. 04/20/21 01/31/22  Minna Merritts, MD  COD LIVER OIL PO Take 1 capsule by mouth 2 (two) times daily. 10 mcg    [provider]  dofetilide (TIKOSYN) 500 MCG capsule Take 1 capsule (500 mcg total) by mouth 2 (two) times daily. 08/19/21   Evans Lance, MD  ELIQUIS 5 MG TABS tablet TAKE 1 TABLET TWICE A DAY 04/26/22   Minna Merritts, MD  ezetimibe (ZETIA) 10 MG tablet Take 1 tablet (10 mg total) by mouth daily. 04/20/21   Minna Merritts, MD  gabapentin (NEURONTIN) 600 MG tablet Take 0.5-1 tablets (300-600 mg total) by mouth 3 (three) times daily as needed. 07/07/20   Tonia Ghent, MD  loratadine (CLARITIN) 10 MG tablet Take 1 tablet (10 mg total) by mouth daily. 10/28/18   Tonia Ghent, MD  losartan (COZAAR) 100 MG tablet Take 1 tablet (100 mg total) by mouth daily. 04/20/21   Minna Merritts, MD  meloxicam (MOBIC) 15 MG tablet Take 15 mg by mouth daily. 01/21/22   [provider]  metFORMIN (GLUCOPHAGE) 850 MG tablet TAKE 1 TABLET TWICE A DAY WITH MEALS 03/24/22   Tonia Ghent, MD  nitroGLYCERIN (NITROSTAT) 0.4 MG SL tablet Place 1 tablet (0.4 mg total) under the tongue every 5 (five) minutes as needed for chest pain (max 3 doses). 04/20/21   Minna Merritts, MD  Omega-3 Fatty Acids (SALMON OIL PO) Take 1,000 mg by mouth. 1 twice daily    [provider]  tamsulosin (FLOMAX) 0.4 MG CAPS capsule Take 0.4 mg by mouth daily. 01/21/22   [provider]  timolol (BETIMOL) 0.5 % ophthalmic solution Place 1 drop into both eyes 2 (two) times daily.    [provider]  valACYclovir (VALTREX) 1000 MG tablet Take 1 tablet (1,000 mg total) by mouth 2 (two) times daily as needed. 09/23/20   Tonia Ghent, MD    Family History  Problem  Relation Age of Onset   Stroke Mother    Diabetes Mother    Heart disease Father        heart failure   Colon cancer Maternal Grandmother    Parkinsonism Maternal Grandfather    Stroke Paternal Grandfather    Prostate cancer Maternal Uncle    CAD Brother        CABG     Social History   Tobacco Use   Smoking status: Never   Smokeless tobacco: Never  Vaping Use   Vaping Use: Never used  Substance Use Topics   Alcohol  use: No    Alcohol/week: 0.0 standard drinks of alcohol   Drug use: No    Allergies as of 04/28/2022 - Review Complete 04/28/2022  Allergen Reaction Noted   Fenofibrate Other (See Comments) 03/22/2017   Fire ant  11/26/2019   Statins Other (See Comments) 12/08/2011    Review of Systems:    All systems reviewed and negative except where noted in HPI.   Physical Exam:  BP (!) 157/78   Pulse 69   Temp 97.9 F (36.6 C) (Oral)   Ht '5\' 7"'$  (1.702 m)   Wt 151 lb 12.8 oz (68.9 kg)   BMI 23.78 kg/m  No LMP for male patient. Psych:  Alert and cooperative. Normal mood and affect. General:   Alert,  Well-developed, well-nourished, pleasant and cooperative in NAD Head:  Normocephalic and atraumatic. Eyes:  Sclera clear, no icterus.   Conjunctiva pink. Ears:  Normal auditory acuity. Abdomen:  Normal bowel sounds.  No bruits.  Soft, non-tender and non-distended without masses, hepatosplenomegaly or hernias noted.  No guarding or rebound tenderness.    Neurologic:  Alert and oriented x3;  grossly normal neurologically. Psych:  Alert and cooperative. Normal mood and affect.  Imaging Studies: No results found.  Assessment and Plan:   [name] [name] is a 81 y.o. y/o male has been referred for, pain last colonoscopy 2018 a 10 mm polyp resected in the sigmoid colon piecemeal which was a tubular adenoma.  Recent onset worsening of generalized abdominal pain, associated with over 25 to 35 pounds of weight loss over the past few months.  Change in the shape of his stool as  well as being more fragmented.  I do think the degree of constipation.  It is possible that the severe diverticulosis may be causing a degree of narrowing contributing to constipation and pain  Plan 1.  EGD and colonoscopy to evaluate unintentional weight loss 2.  Commence on Linzess 75 mcg samples will be provided for a week 3.  CT abdomen and pelvis with contrast to evaluate for abdominal pain and unintentional weight loss  I have discussed alternative options, risks & benefits,  which include, but are not limited to, bleeding, infection, perforation,respiratory complication & drug reaction.  The patient agrees with this plan & written consent will be obtained.     Follow up in 6 weeks  Dr Jonathon Bellows MD,MRCP(U.K)

## 2022-04-28 NOTE — Patient Instructions (Addendum)
Please remember to go to the [facility] and pick up your contrast at least 4-5 business days before.  Please do not eat or drink 4 hours prior to your CT Scan.  Please arrive 30 minutes prior to your CT Scan.  Please take Linzess 72 mcg 30 minutes before breakfast. Please let us know if it works, then Dr. Vicente Males will send in a prescription for you.

## 2022-04-29 NOTE — Progress Notes (Signed)
Called patient for blood pressure log as previously discussed.   Date  Blood Pressure Pulse 09/22  154/73   59 09/21  156/77   82 09/20  142/72   55 09/19  154/77   ----   Charlene Brooke, CPP notified Marijean Niemann, [place] Clinical Pharmacy Assistant [phone]

## 2022-04-29 NOTE — Telephone Encounter (Addendum)
BP is considerably higher than previous office visits.   BP Readings from Last 3 Encounters:  04/28/22 (!) 157/78  01/31/22 (!) 118/56  12/16/21 138/78    He has amlodipine 10 mg daily. Per cardiology he is authorized to take an additional 1/2 tablet for BP 150-160. Has he been doing this?

## 2022-05-02 MED ORDER — AMLODIPINE BESYLATE 10 MG PO TABS: 10.0000 mg | ORAL_TABLET | Freq: Every day | ORAL | 0 refills | Status: DC

## 2022-05-02 NOTE — Telephone Encounter (Addendum)
Patient reports BP improved after extra 1/2 tablet of amlodipine.   He also reports he is running low on amlodipine - per chart last Rx was 04/20/21 #90 with 3 RF per Dr Rockey Situ, so he will need a new Rx and may need annual appt.  Routing refill request to [facility] refill pool for assistance.  Preferred pharmacy: [facility], [place] [address] [place] [address] Phone: [phone] Fax: [phone]

## 2022-05-02 NOTE — Telephone Encounter (Signed)
Patient called and stated his blood pressure dropped from 152/77 to 135/62.

## 2022-05-02 NOTE — Telephone Encounter (Signed)
Please contact p/t for 12 month f/u, last seen 04-2021

## 2022-05-02 NOTE — Telephone Encounter (Signed)
Spoke with patient. He has not been taking an extra 1/2 tablet of Amlodipine 10 mg. I advised patient to take an extra 1/2 when his blood pressure was above 150/160. I asked patient to take his blood pressure and keep a log. 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 I advised him again to take his blood pressure and take the extra 1/2 tab if his pressure was above 1501/60. Patient agreed. I advised patient to call our office if his pressure was not going down after the 1/2 tablet. Patient stated he was good with his blood pressure the way it was as it used to be 180/190 and he was fine.   Charlene Brooke, CPP notified  Marijean Niemann, [place] Clinical Pharmacy Assistant [phone]

## 2022-05-03 ENCOUNTER — Other Ambulatory Visit: Payer: Self-pay | Admitting: Cardiovascular Disease

## 2022-05-04 ENCOUNTER — Telehealth: Payer: Self-pay | Admitting: Cardiovascular Disease

## 2022-05-04 NOTE — Telephone Encounter (Signed)
Pt c/o medication issue:  1. Name of Medication:   amLODipine (NORVASC) 10 MG tablet  2. How are you currently taking this medication (dosage and times per day)?   As prescribed  3. Are you having a reaction (difficulty breathing--STAT)?   No  4. What is your medication issue?   Patient stated that his insurance would not pay for this brand name medication and would like a prescription for the generic version sent to [facility], [place].  Patient stated that he is almost out of this medication.

## 2022-05-04 NOTE — Telephone Encounter (Signed)
Refill sent to pharmacy on 05/02/22. Patient due for appt. Please call to schedule. Thank you!

## 2022-05-04 NOTE — Telephone Encounter (Signed)
LVM to schedule fu appt, please schedule 

## 2022-05-11 ENCOUNTER — Ambulatory Visit
Admission: RE | Admit: 2022-05-11 | Discharge: 2022-05-11 | Disposition: A | Discharge status: Home / Self Care | Payer: Medicare Other | Source: Ambulatory Visit | Attending: Gastroenterology | Admitting: Gastroenterology

## 2022-05-11 DIAGNOSIS — R1010 Upper abdominal pain, unspecified: Secondary | ICD-10-CM | POA: Diagnosis not present

## 2022-05-11 DIAGNOSIS — R634 Abnormal weight loss: Secondary | ICD-10-CM | POA: Insufficient documentation

## 2022-05-11 DIAGNOSIS — I700 Atherosclerosis of aorta: Secondary | ICD-10-CM | POA: Diagnosis not present

## 2022-05-11 DIAGNOSIS — R109 Unspecified abdominal pain: Secondary | ICD-10-CM | POA: Diagnosis not present

## 2022-05-11 LAB — POCT I-STAT CREATININE: Creatinine, Ser: 0.8 mg/dL (ref 0.61–1.24)

## 2022-05-11 MED ORDER — IOHEXOL 300 MG/ML  SOLN
100.0000 mL | Freq: Once | INTRAMUSCULAR | Status: AC | PRN
  Administered 2022-05-11: 100 mL via INTRAVENOUS

## 2022-05-12 ENCOUNTER — Other Ambulatory Visit
Admission: RE | Admit: 2022-05-12 | Discharge: 2022-05-12 | Disposition: A | Discharge status: Home / Self Care | Payer: Medicare Other | Source: Ambulatory Visit | Attending: Nurse Practitioner | Admitting: Nurse Practitioner

## 2022-05-12 ENCOUNTER — Encounter: Payer: Self-pay | Admitting: Nurse Practitioner

## 2022-05-12 ENCOUNTER — Ambulatory Visit: Payer: Medicare Other | Attending: Nurse Practitioner | Admitting: Nurse Practitioner

## 2022-05-12 ENCOUNTER — Telehealth: Payer: Self-pay

## 2022-05-12 VITALS — BP 150/80 | HR 67 | Ht 67.0 in | Wt 157.4 lb

## 2022-05-12 DIAGNOSIS — I10 Essential (primary) hypertension: Secondary | ICD-10-CM

## 2022-05-12 DIAGNOSIS — Z79899 Other long term (current) drug therapy: Secondary | ICD-10-CM | POA: Diagnosis not present

## 2022-05-12 DIAGNOSIS — I25110 Atherosclerotic heart disease of native coronary artery with unstable angina pectoris: Secondary | ICD-10-CM | POA: Diagnosis not present

## 2022-05-12 DIAGNOSIS — E785 Hyperlipidemia, unspecified: Secondary | ICD-10-CM

## 2022-05-12 DIAGNOSIS — I480 Paroxysmal atrial fibrillation: Secondary | ICD-10-CM

## 2022-05-12 DIAGNOSIS — I200 Unstable angina: Secondary | ICD-10-CM

## 2022-05-12 DIAGNOSIS — Z01810 Encounter for preprocedural cardiovascular examination: Secondary | ICD-10-CM

## 2022-05-12 DIAGNOSIS — I63429 Cerebral infarction due to embolism of unspecified anterior cerebral artery: Secondary | ICD-10-CM

## 2022-05-12 LAB — CBC
HCT: 43.1 % (ref 39.0–52.0)
Hemoglobin: 14.4 g/dL (ref 13.0–17.0)
MCH: 31.5 pg (ref 26.0–34.0)
MCHC: 33.4 g/dL (ref 30.0–36.0)
MCV: 94.3 fL (ref 80.0–100.0)
Platelets: 154 10*3/uL (ref 150–400)
RBC: 4.57 MIL/uL (ref 4.22–5.81)
RDW: 12.8 % (ref 11.5–15.5)
WBC: 6.3 10*3/uL (ref 4.0–10.5)
nRBC: 0 % (ref 0.0–0.2)

## 2022-05-12 LAB — BASIC METABOLIC PANEL
Anion gap: 7 (ref 5–15)
BUN: 17 mg/dL (ref 8–23)
CO2: 27 mmol/L (ref 22–32)
Calcium: 9.2 mg/dL (ref 8.9–10.3)
Chloride: 104 mmol/L (ref 98–111)
Creatinine, Ser: 0.73 mg/dL (ref 0.61–1.24)
GFR, Estimated: >60 mL/min (ref >60)
Glucose, Bld: 131 mg/dL — ABNORMAL HIGH (ref 70–99)
Potassium: 4.2 mmol/L (ref 3.5–5.1)
Sodium: 138 mmol/L (ref 135–145)

## 2022-05-12 MED ORDER — ASPIRIN 81 MG PO TBEC: 81.0000 mg | DELAYED_RELEASE_TABLET | Freq: Every day | ORAL | 12 refills | Status: DC

## 2022-05-12 MED ORDER — NITROGLYCERIN 0.4 MG SL SUBL: 0.4000 mg | SUBLINGUAL_TABLET | SUBLINGUAL | 6 refills | Status: DC | PRN

## 2022-05-12 MED ORDER — AMLODIPINE BESYLATE 10 MG PO TABS: 10.0000 mg | ORAL_TABLET | Freq: Every day | ORAL | 3 refills | Status: DC

## 2022-05-12 MED ORDER — ISOSORBIDE MONONITRATE ER 30 MG PO TB24: 30.0000 mg | ORAL_TABLET | Freq: Every day | ORAL | 3 refills | Status: DC

## 2022-05-12 NOTE — Patient Instructions (Addendum)
Medication Instructions:  1.Start isosorbide mononitrate 30 mg daily 2.Continue to hold eliquis 3.Start Aspirin 81 mg daily, this is over the counter *If you need a refill on your cardiac medications before your next appointment, please call your pharmacy*   Lab Work: CBC and BMET today If you have labs (blood work) drawn today and your tests are completely normal, you will receive your results only by: [place] (if you have MyChart) OR A paper copy in the mail If you have any lab test that is abnormal or we need to change your treatment, we will call you to review the results.   Testing/Procedures: [place] [facility] DEPT OF [place] [facility] DEPT OF [place]. [facility] [place], [place] [address] Dept: [phone] Loc: [place]  05/12/2022  You are scheduled for a Cardiac Catheterization on Monday, October 9 with Dr. [name].  1. Please arrive at the medical mall at 9:30 AM (This time is one hour before your procedure to ensure your preparation). Free valet parking service is available.   Special note: Every effort is made to have your procedure done on time. Please understand that emergencies sometimes delay scheduled procedures.  2. Diet: Do not eat solid foods after midnight.  The patient may have clear liquids until 5am upon the day of the procedure.  3. Labs: You will need to have blood drawn on today: CBC and BMET  4. Medication instructions in preparation for your procedure:   Contrast Allergy: No  Continue to hold Eliquis as discussed at today's office visit  Do not take Diabetes Med Glucophage (Metformin) on the day of the procedure and HOLD 48 HOURS AFTER THE PROCEDURE.  On the morning of your procedure, take your Aspirin 81 mg and any morning medicines NOT listed above.  You may use sips of water.  5. Plan for one night stay--bring personal  belongings. 6. Bring a current list of your medications and current insurance cards. 7. You MUST have a responsible person to drive you home. 8. Someone MUST be with you the first 24 hours after you arrive home or your discharge will be delayed. 9. Please wear clothes that are easy to get on and off and wear slip-on shoes.  Thank you for allowing [place] to care for you!   -- [facility]   Follow-Up: At [facility], you and your health needs are our priority.  As part of our continuing mission to provide you with exceptional heart care, we have created designated Provider Care Teams.  These Care Teams include your primary Cardiologist (physician) and Advanced Practice Providers (APPs -  Physician Assistants and Nurse Practitioners) who all work together to provide you with the care you need, when you need it.   Your next appointment:   2 week(s) post cardiac cath which is scheduled for 05/16/22  The format for your next appointment:   In Person  Provider:   You may see Ida Rogue, MD or one of the following Advanced Practice Providers on your designated Care Team:   Murray Hodgkins, NP Christell Faith, PA-C Cadence Kathlen Mody, PA-C Gerrie Nordmann, NP   Important Information About Sugar

## 2022-05-12 NOTE — Telephone Encounter (Signed)
Patients procedures have been rescheduled to 06/20/22 due to cardiac testing/procedure planned for next week. Vikki in Endo notified of date change.  Thanks,  [place], [place]

## 2022-05-12 NOTE — Progress Notes (Signed)
Office Visit    Patient Name: [name] [name] Date of Encounter: 05/12/2022  Primary Care Provider:  Tonia Ghent, MD Primary Cardiologist:  Ida Rogue, MD  Chief Complaint    81 year old male with a history of CAD status post CABG x6 in 2015, diabetes, hypertension, hyperlipidemia, PFO, embolic CVA, and persistent atrial fibrillation, who presents for follow-up related to unstable angina.  Past Medical History    Past Medical History:  Diagnosis Date   Anxiety    Back pain    Coronary artery disease 08/21/2013   a. 08/2013 s/p CABG x 6.   Diabetes mellitus    Diabetic neuropathy (HCC)    Diastolic dysfunction    a. 02/2017 Echo: EF 55-60%, no rwma, Gr1 DD, mildly dil Ao Root/RV. + PFO.   Diverticulosis    Diverticulosis    Embolic stroke ([place])    a. 04/2017 MRI in setting of freq falls: small subacute infarction of bilat centrum semi-ovale consistent w/ embolic event-->Eliquis.   Erectile dysfunction    Family history of anesthesia complication    ' they cant wake my brother very easy"   Fatty liver    Flank pain    GERD (gastroesophageal reflux disease)    History of bronchitis    History of chicken pox    HSV infection    ocular symptoms and oral lesions   Hypercholesterolemia    Hypertension    Kidney stones    Osteoarthritis    PAF (paroxysmal atrial fibrillation) ([place])    a. Had post-op AF 08/2013; b. 09/2015 Holter: PAC's no AF; c. 05/2017 Noted to be in AFib-->Eliquis (CHA2DS2VASc = 7); d. 08/2017 s/p DCCV-->ERAF-->Tikosyn added; e. 08/2020 Zio: 4% Afib burden.   PFO (patent foramen ovale)    a. 02/2017 Echo: + PFO.   Sinus node dysfunction (HCC)    a. 08/2020 Zio: Predominantly sinus bradycardia at 53 (35-211).  20 SVT runs.  1 run of nonsustained VT x4 beats.  4% A-fib burden.  16 pauses lasting up to 3.6 seconds.  Intermittent junctional rhythm.  Patient declined pacemaker.   Past Surgical History:  Procedure Laterality Date   CARDIAC CATHETERIZATION   08/21/2013   DR Angelena Form   CARDIOVERSION N/A 08/24/2017   Procedure: CARDIOVERSION;  Surgeon: Minna Merritts, MD;  Location: [facility] ORS;  Service: Cardiovascular;  Laterality: N/A;   [place] N/A 08/23/2013   Procedure: CORONARY ARTERY BYPASS GRAFTING (CABG);  Surgeon: Grace Isaac, MD;  Location: [place];  Service: Open Heart Surgery;  Laterality: N/A;  CABG times six utilizing the left internal mammary artery and the right greater saphenous vein harvested endoscopically   ENDOVEIN HARVEST OF GREATER SAPHENOUS VEIN Right 08/23/2013   Procedure: ENDOVEIN HARVEST OF GREATER SAPHENOUS VEIN;  Surgeon: Grace Isaac, MD;  Location: [facility] [place];  Service: Open Heart Surgery;  Laterality: Right;   HERNIA REPAIR     X3   INTRAOPERATIVE TRANSESOPHAGEAL ECHOCARDIOGRAM N/A 08/23/2013   Procedure: INTRAOPERATIVE TRANSESOPHAGEAL ECHOCARDIOGRAM;  Surgeon: Grace Isaac, MD;  Location: [place];  Service: Open Heart Surgery;  Laterality: N/A;   LEFT HEART CATHETERIZATION WITH CORONARY ANGIOGRAM N/A 08/21/2013   Procedure: LEFT HEART CATHETERIZATION WITH CORONARY ANGIOGRAM;  Surgeon: Burnell Blanks, MD;  Location: [facility] CATH LAB;  Service: Cardiovascular;  Laterality: N/A;    Allergies  Allergies  Allergen Reactions   Fenofibrate Other (See Comments)    Muscle weakness/pain   [employer]  Allergic reaction   Statins Other (See Comments)    MYALGIA, muscle pain    History of Present Illness    81 year old male with the above complex past medical history cleaning CAD status post CABG x6 in January 2015, hypertension, hyperlipidemia, type 2 diabetes mellitus, and persistent atrial fibrillation.  He did have postoperative atrial fibrillation following his bypass surgery in 2015, but this resolved after short course of amiodarone and he only required warfarin for a brief period of time.  He had palpitations in early 2017 with Holter monitoring at that time  showing sinus rhythm with PACs.  In spring 2018, he was evaluated by neurology in the setting of falls.  MRI in September 2018 showed small, subacute infarct of the bilateral centrum Semi ovale consistent with prior embolic event.  It was felt that this was most likely explained by paroxysmal atrial fibrillation and he was placed on Eliquis 5 mg twice daily.  He was subsequently found to be in atrial fibrillation cardiology clinic in October 2018 and referred to A-fib clinic.  Recommendation was made for Tikosyn initiation though patient deferred.  He subsequently underwent cardioversion in January 2019 which was initially successful however, he had return of A-fib and associated fatigue within approximately 4 days.  He was referred back to A-fib clinic and subsequently was admitted and loaded with Tikosyn.  In early 2022, he suffered a syncopal episode.  He subsequently wore an event monitor which showed predominantly sinus bradycardia at 53 bpm with a range of 35 to 211 bpm.  He had 20 brief runs of SVT and a 4% A-fib burden.  He also had episodic junctional rhythm and 16 pauses lasting up to 3.6 seconds.  He followed up with electrophysiology [name]) in March 2022 and was offered permanent pacemaker insertion but patient wished to defer.    [name] [name] was last seen in EP clinic in June 2023, at which time he was doing well.  Since then, he has been dealing with a prostate infection and he was advised to hold his Eliquis while taking Mobic for discomfort.  He has a long history of lower abdominal discomfort and just had a CT of the abdomen and pelvis yesterday-results pending.  He is tentatively scheduled for a colonoscopy next week.  Over the past 2 to 3 months, he has been experiencing progressive exertional substernal chest burning associated with dyspnea, lasting about 10 minutes, and resolving with rest.  Symptoms are somewhat reminiscent of prior angina.  He has not had any symptoms occurring at rest.  He  has not tried using nitrates.  He denies palpitations, PND, orthopnea, dizziness, syncope, edema, or early satiety.  Home Medications    Current Outpatient Medications  Medication Sig Dispense Refill   acyclovir (ZOVIRAX) 800 MG tablet Take 1 tablet (800 mg total) by mouth 2 (two) times daily. 180 tablet 3   amLODipine (NORVASC) 10 MG tablet Take 1 tablet (10 mg total) by mouth daily. Please contact clinic for annual visit due 04-2022. 90 tablet 0   apixaban (ELIQUIS) 5 MG TABS tablet Take 5 mg by mouth 2 (two) times daily.     COD LIVER OIL PO Take 1 capsule by mouth 2 (two) times daily. 10 mcg     dofetilide (TIKOSYN) 500 MCG capsule Take 1 capsule (500 mcg total) by mouth 2 (two) times daily. 180 capsule 2   ezetimibe (ZETIA) 10 MG tablet Take 1 tablet (10 mg total) by mouth daily. 90 tablet 3  gabapentin (NEURONTIN) 600 MG tablet Take 0.5-1 tablets (300-600 mg total) by mouth 3 (three) times daily as needed. 270 tablet 1   loratadine (CLARITIN) 10 MG tablet Take 1 tablet (10 mg total) by mouth daily.     losartan (COZAAR) 100 MG tablet Take 1 tablet (100 mg total) by mouth daily. 90 tablet 2   meloxicam (MOBIC) 15 MG tablet Take 15 mg by mouth daily.     metFORMIN (GLUCOPHAGE) 850 MG tablet TAKE 1 TABLET TWICE A DAY WITH MEALS 180 tablet 3   nitroGLYCERIN (NITROSTAT) 0.4 MG SL tablet Place 1 tablet (0.4 mg total) under the tongue every 5 (five) minutes as needed for chest pain (max 3 doses). 25 tablet 6   Omega-3 Fatty Acids (SALMON OIL PO) Take 1,000 mg by mouth. 1 twice daily     tamsulosin (FLOMAX) 0.4 MG CAPS capsule Take 0.4 mg by mouth daily.     timolol (BETIMOL) 0.5 % ophthalmic solution Place 1 drop into both eyes 2 (two) times daily.     valACYclovir (VALTREX) 1000 MG tablet Take 1 tablet (1,000 mg total) by mouth 2 (two) times daily as needed. 60 tablet 1   No current facility-administered medications for this visit.    Family history    Family History  Problem Relation Age  of Onset   Stroke Mother    Diabetes Mother    Heart disease Father        heart failure   Colon cancer Maternal Grandmother    Parkinsonism Maternal Grandfather    Stroke Paternal Grandfather    Prostate cancer Maternal Uncle    CAD Brother        CABG    Social history    Social History   Socioeconomic History   Marital status: Married    Spouse name: Not on file   Number of children: 2   Years of education: HS   Highest education level: Not on file  Occupational History   Occupation: Retired  Tobacco Use   Smoking status: Never   Smokeless tobacco: Never  Vaping Use   Vaping Use: Never used  Substance and Sexual Activity   Alcohol use: No    Alcohol/week: 0.0 standard drinks of alcohol   Drug use: No   Sexual activity: Not on file  Other Topics Concern   Not on file  Social History Narrative   Retired, remarried 1988   From [place], [place]   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   Right-handed.   2 cups caffeine daily   Social Determinants of Health   Financial Resource Strain: Low Risk  (12/14/2021)   Overall Financial Resource Strain (CARDIA)    Difficulty of Paying Living Expenses: Not hard at all  Food Insecurity: No Food Insecurity (12/14/2021)   Hunger Vital Sign    Worried About Running Out of Food in the Last Year: Never true    Ran Out of Food in the Last Year: Never true  Transportation Needs: No Transportation Needs (12/14/2021)   PRAPARE - Hydrologist (Medical): No    Lack of Transportation (Non-Medical): No  Physical Activity: Sufficiently Active (12/14/2021)   Exercise Vital Sign    Days of Exercise per Week: 5 days    Minutes of Exercise per Session: 60 min  Stress: No Stress Concern Present (12/14/2021)   [place]  Feeling of Stress : Not at all  Social Connections: Moderately Integrated (12/14/2021)    Social Connection and Isolation Panel [NHANES]    Frequency of Communication with Friends and Family: Three times a week    Frequency of Social Gatherings with Friends and Family: Twice a week    Attends Religious Services: Never    Marine scientist or Organizations: Yes    Attends Music therapist: More than 4 times per year    Marital Status: Married  Human resources officer Violence: Not At Risk (12/14/2021)   Humiliation, Afraid, Rape, and Kick questionnaire    Fear of Current or Ex-Partner: No    Emotionally Abused: No    Physically Abused: No    Sexually Abused: No    Review of Systems    As above, has been experiencing progressive unstable angina over the past few months.  He has a long history of lower abdominal discomfort and is being evaluated by GI with colonoscopy pending.  He denies dyspnea, palpitations, PND, orthopnea, dizziness, syncope, edema, or early satiety.  All other systems reviewed and are otherwise negative except as noted above.   Physical Exam    VS:  BP (!) 150/80   Pulse 67   Ht '5\' 7"'$  (1.702 m)   Wt 157 lb 6.4 oz (71.4 kg)   SpO2 98%   BMI 24.65 kg/m  , BMI Body mass index is 24.65 kg/m.     Vitals:   05/12/22 1127 05/12/22 1204  BP: (!) 166/60 (!) 150/80  Pulse: 67   SpO2: 98%     GEN: Well nourished, well developed, in no acute distress. HEENT: normal. Neck: Supple, no JVD, carotid bruits, or masses. Cardiac: RRR, no murmurs, rubs, or gallops. No clubbing, cyanosis, edema.  Radials/PT 2+ and equal bilaterally.  Respiratory:  Respirations regular and unlabored, clear to auscultation bilaterally. GI: Soft, nontender, nondistended, BS + x 4. MS: no deformity or atrophy. Skin: warm and dry, no rash. Neuro:  Strength and sensation are intact. Psych: Normal affect.  Accessory Clinical Findings    ECG personally reviewed by me today -sinus rhythm, 67, first-degree AV block, PACs, junctional escape beats and intermittent Mobitz 1  (noted on attached rhythm strip).  Prior inferior infarct- no acute changes.  Lab Results  Component Value Date   WBC 7.1 12/16/2021   HGB 15.2 12/16/2021   HCT 45.5 12/16/2021   MCV 97.3 12/16/2021   PLT 181.0 12/16/2021   Lab Results  Component Value Date   CREATININE 0.80 05/11/2022   BUN 22 12/16/2021   NA 140 12/16/2021   K 4.4 12/16/2021   CL 104 12/16/2021   CO2 29 12/16/2021   Lab Results  Component Value Date   ALT 13 12/16/2021   AST 18 12/16/2021   ALKPHOS 85 12/16/2021   BILITOT 1.1 12/16/2021   Lab Results  Component Value Date   CHOL 112 12/16/2021   HDL 36.10 (L) 12/16/2021   LDLCALC 62 06/07/2017   LDLDIRECT 67.0 12/16/2021   TRIG 220.0 (H) 12/16/2021   CHOLHDL 3 12/16/2021    Lab Results  Component Value Date   HGBA1C 6.4 12/16/2021    Assessment & Plan    1.  Unstable angina/coronary artery disease: Prior history of multivessel CAD status post CABG x6 in January 2015.  Over the past 2 to 3 months, he has been experiencing progressive exertional substernal chest burning and dyspnea lasting about 5 to 10 minutes, resolving with rest.  Symptoms  are somewhat reminiscent of prior anginal equivalent.  He has not had any symptoms at rest.  We discussed his symptoms at length today along with most appropriate diagnostic approach.  Symptoms are concerning for angina and he will be best served with a diagnostic catheterization.  The patient understands that risks include but are not limited to stroke (1 in 1000), death (1 in 46), kidney failure [usually temporary] (1 in 500), bleeding (1 in 200), allergic reaction [possibly serious] (1 in 200), and agrees to proceed.  Follow-up CBC and basic metabolic panel today.  He has been holding his Eliquis in the setting of a prostate infection and Mobic usage.  I am going to add low-dose aspirin for the time being as well as Imdur 30 mg daily.  I will also provide him with a prescription for sublingual nitroglycerin to be  used as needed.  2.  Essential hypertension: Blood pressure elevated today.  Adding Imdur 30 mg daily in the setting of above.  He is already on amlodipine 10 mg daily and losartan 100 mg daily.  3.  Hyperlipidemia: LDL of 67 in May.  He is statin intolerant and managed with fish oil and Zetia.  4.  Paroxysmal atrial fibrillation: Maintaining sinus rhythm on Tikosyn therapy with stable QT.  He is anticoagulated with Eliquis but this is actually on hold in the setting of prostate infection and Mobic usage.  Continue to hold throughout the pericatheterization timeframe.    5.  Sinus node dysfunction: Patient with a history of sinus pauses, baseline bradycardia, junctional escape beats, and Mobitz 1.  No recent presyncope or syncope.  He is followed by EP with prior recommendation for pacemaker however, patient deferred.    6.  History of embolic CVA: Presumed to be in the setting of A-fib.  As above, Eliquis has been on hold with plan to resume once his prostate infection resolves.    7.  Preprocedure cardiovascular examination: Patient pending colonoscopy in the setting of a long history of lower abdominal discomfort.  In the setting of unstable angina, he is not currently a candidate for colonoscopy, with diagnostic catheterization planned first.  8.  Disposition: Follow-up lab work today.  Plan for diagnostic catheterization-first available in the setting of unstable angina.  Follow-up in clinic 2 weeks post catheterization.   Murray Hodgkins, NP 05/12/2022, 12:04 PM

## 2022-05-12 NOTE — H&P (View-Only) (Signed)
Office Visit    Patient Name: [name] [name] Date of Encounter: 05/12/2022  Primary Care Provider:  Tonia Ghent, MD Primary Cardiologist:  [name] Rogue, MD  Chief Complaint    81 year old male with a history of CAD status post CABG x6 in 2015, diabetes, hypertension, hyperlipidemia, PFO, embolic CVA, and persistent atrial fibrillation, who presents for follow-up related to unstable angina.  Past Medical History    Past Medical History:  Diagnosis Date   Anxiety    Back pain    Coronary artery disease 08/21/2013   a. 08/2013 s/p CABG x 6.   Diabetes mellitus    Diabetic neuropathy (HCC)    Diastolic dysfunction    a. 02/2017 Echo: EF 55-60%, no rwma, Gr1 DD, mildly dil Ao Root/RV. + PFO.   Diverticulosis    Diverticulosis    Embolic stroke ([place])    a. 04/2017 MRI in setting of freq falls: small subacute infarction of bilat centrum semi-ovale consistent w/ embolic event-->Eliquis.   Erectile dysfunction    Family history of anesthesia complication    ' they cant wake my brother very easy"   Fatty liver    Flank pain    GERD (gastroesophageal reflux disease)    History of bronchitis    History of chicken pox    HSV infection    ocular symptoms and oral lesions   Hypercholesterolemia    Hypertension    Kidney stones    Osteoarthritis    PAF (paroxysmal atrial fibrillation) ([place])    a. Had post-op AF 08/2013; b. 09/2015 Holter: PAC's no AF; c. 05/2017 Noted to be in AFib-->Eliquis (CHA2DS2VASc = 7); d. 08/2017 s/p DCCV-->ERAF-->Tikosyn added; e. 08/2020 Zio: 4% Afib burden.   PFO (patent foramen ovale)    a. 02/2017 Echo: + PFO.   Sinus node dysfunction (HCC)    a. 08/2020 Zio: Predominantly sinus bradycardia at 53 (35-211).  20 SVT runs.  1 run of nonsustained VT x4 beats.  4% A-fib burden.  16 pauses lasting up to 3.6 seconds.  Intermittent junctional rhythm.  Patient declined pacemaker.   Past Surgical History:  Procedure Laterality Date   CARDIAC CATHETERIZATION   08/21/2013   DR [name] [name]   CARDIOVERSION N/A 08/24/2017   Procedure: CARDIOVERSION;  Surgeon: [name] Merritts, MD;  Location: [facility];  Service: Cardiovascular;  Laterality: N/A;   [place] N/A 08/23/2013   Procedure: CORONARY ARTERY BYPASS GRAFTING (CABG);  Surgeon: [name] Isaac, MD;  Location: [place];  Service: Open Heart Surgery;  Laterality: N/A;  CABG times six utilizing the left internal mammary artery and the right greater saphenous vein harvested endoscopically   ENDOVEIN HARVEST OF GREATER SAPHENOUS VEIN Right 08/23/2013   Procedure: ENDOVEIN HARVEST OF GREATER SAPHENOUS VEIN;  Surgeon: [name] Isaac, MD;  Location: [place];  Service: Open Heart Surgery;  Laterality: Right;   HERNIA REPAIR     X3   INTRAOPERATIVE TRANSESOPHAGEAL ECHOCARDIOGRAM N/A 08/23/2013   Procedure: INTRAOPERATIVE TRANSESOPHAGEAL ECHOCARDIOGRAM;  Surgeon: [name] Isaac, MD;  Location: [place];  Service: Open Heart Surgery;  Laterality: N/A;   LEFT HEART CATHETERIZATION WITH CORONARY ANGIOGRAM N/A 08/21/2013   Procedure: LEFT HEART CATHETERIZATION WITH CORONARY ANGIOGRAM;  Surgeon: Burnell Blanks, MD;  Location: [facility] CATH LAB;  Service: Cardiovascular;  Laterality: N/A;    Allergies  Allergies  Allergen Reactions   Fenofibrate Other (See Comments)    Muscle weakness/pain   [employer]  Allergic reaction   Statins Other (See Comments)    MYALGIA, muscle pain    History of Present Illness    81 year old male with the above complex past medical history cleaning CAD status post CABG x6 in January 2015, hypertension, hyperlipidemia, type 2 diabetes mellitus, and persistent atrial fibrillation.  He did have postoperative atrial fibrillation following his bypass surgery in 2015, but this resolved after short course of amiodarone and he only required warfarin for a brief period of time.  He had palpitations in early 2017 with Holter monitoring at that time  showing sinus rhythm with PACs.  In spring 2018, he was evaluated by neurology in the setting of falls.  MRI in September 2018 showed small, subacute infarct of the bilateral centrum Semi ovale consistent with prior embolic event.  It was felt that this was most likely explained by paroxysmal atrial fibrillation and he was placed on Eliquis 5 mg twice daily.  He was subsequently found to be in atrial fibrillation cardiology clinic in October 2018 and referred to A-fib clinic.  Recommendation was made for Tikosyn initiation though patient deferred.  He subsequently underwent cardioversion in January 2019 which was initially successful however, he had return of A-fib and associated fatigue within approximately 4 days.  He was referred back to A-fib clinic and subsequently was admitted and loaded with Tikosyn.  In early 2022, he suffered a syncopal episode.  He subsequently wore an event monitor which showed predominantly sinus bradycardia at 53 bpm with a range of 35 to 211 bpm.  He had 20 brief runs of SVT and a 4% A-fib burden.  He also had episodic junctional rhythm and 16 pauses lasting up to 3.6 seconds.  He followed up with electrophysiology [name] [name]) in March 2022 and was offered permanent pacemaker insertion but patient wished to defer.    [name] was last seen in EP clinic in June 2023, at which time he was doing well.  Since then, he has been dealing with a prostate infection and he was advised to hold his Eliquis while taking Mobic for discomfort.  He has a long history of lower abdominal discomfort and just had a CT of the abdomen and pelvis yesterday-results pending.  He is tentatively scheduled for a colonoscopy next week.  Over the past 2 to 3 months, he has been experiencing progressive exertional substernal chest burning associated with dyspnea, lasting about 10 minutes, and resolving with rest.  Symptoms are somewhat reminiscent of prior angina.  He has not had any symptoms occurring at rest.  He  has not tried using nitrates.  He denies palpitations, PND, orthopnea, dizziness, syncope, edema, or early satiety.  Home Medications    Current Outpatient Medications  Medication Sig Dispense Refill   acyclovir (ZOVIRAX) 800 MG tablet Take 1 tablet (800 mg total) by mouth 2 (two) times daily. 180 tablet 3   amLODipine (NORVASC) 10 MG tablet Take 1 tablet (10 mg total) by mouth daily. Please contact clinic for annual visit due 04-2022. 90 tablet 0   apixaban (ELIQUIS) 5 MG TABS tablet Take 5 mg by mouth 2 (two) times daily.     COD LIVER OIL PO Take 1 capsule by mouth 2 (two) times daily. 10 mcg     dofetilide (TIKOSYN) 500 MCG capsule Take 1 capsule (500 mcg total) by mouth 2 (two) times daily. 180 capsule 2   ezetimibe (ZETIA) 10 MG tablet Take 1 tablet (10 mg total) by mouth daily. 90 tablet 3  gabapentin (NEURONTIN) 600 MG tablet Take 0.5-1 tablets (300-600 mg total) by mouth 3 (three) times daily as needed. 270 tablet 1   loratadine (CLARITIN) 10 MG tablet Take 1 tablet (10 mg total) by mouth daily.     losartan (COZAAR) 100 MG tablet Take 1 tablet (100 mg total) by mouth daily. 90 tablet 2   meloxicam (MOBIC) 15 MG tablet Take 15 mg by mouth daily.     metFORMIN (GLUCOPHAGE) 850 MG tablet TAKE 1 TABLET TWICE A DAY WITH MEALS 180 tablet 3   nitroGLYCERIN (NITROSTAT) 0.4 MG SL tablet Place 1 tablet (0.4 mg total) under the tongue every 5 (five) minutes as needed for chest pain (max 3 doses). 25 tablet 6   Omega-3 Fatty Acids (SALMON OIL PO) Take 1,000 mg by mouth. 1 twice daily     tamsulosin (FLOMAX) 0.4 MG CAPS capsule Take 0.4 mg by mouth daily.     timolol (BETIMOL) 0.5 % ophthalmic solution Place 1 drop into both eyes 2 (two) times daily.     valACYclovir (VALTREX) 1000 MG tablet Take 1 tablet (1,000 mg total) by mouth 2 (two) times daily as needed. 60 tablet 1   No current facility-administered medications for this visit.    Family history    Family History  Problem Relation Age  of Onset   Stroke Mother    Diabetes Mother    Heart disease Father        heart failure   Colon cancer Maternal Grandmother    Parkinsonism Maternal Grandfather    Stroke Paternal Grandfather    Prostate cancer Maternal Uncle    CAD Brother        CABG    Social history    Social History   Socioeconomic History   Marital status: Married    Spouse name: Not on file   Number of children: 2   Years of education: HS   Highest education level: Not on file  Occupational History   Occupation: Retired  Tobacco Use   Smoking status: Never   Smokeless tobacco: Never  Vaping Use   Vaping Use: Never used  Substance and Sexual Activity   Alcohol use: No    Alcohol/week: 0.0 standard drinks of alcohol   Drug use: No   Sexual activity: Not on file  Other Topics Concern   Not on file  Social History Narrative   Retired, remarried 1988   From [place], [place]   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   Right-handed.   2 cups caffeine daily   Social Determinants of Health   Financial Resource Strain: Low Risk  (12/14/2021)   Overall Financial Resource Strain (CARDIA)    Difficulty of Paying Living Expenses: Not hard at all  Food Insecurity: No Food Insecurity (12/14/2021)   Hunger Vital Sign    Worried About Running Out of Food in the Last Year: Never true    Ran Out of Food in the Last Year: Never true  Transportation Needs: No Transportation Needs (12/14/2021)   PRAPARE - Hydrologist (Medical): No    Lack of Transportation (Non-Medical): No  Physical Activity: Sufficiently Active (12/14/2021)   Exercise Vital Sign    Days of Exercise per Week: 5 days    Minutes of Exercise per Session: 60 min  Stress: No Stress Concern Present (12/14/2021)   [place]  Feeling of Stress : Not at all  Social Connections: Moderately Integrated (12/14/2021)    Social Connection and Isolation Panel [NHANES]    Frequency of Communication with Friends and Family: Three times a week    Frequency of Social Gatherings with Friends and Family: Twice a week    Attends Religious Services: Never    Marine scientist or Organizations: Yes    Attends Music therapist: More than 4 times per year    Marital Status: Married  Human resources officer Violence: Not At Risk (12/14/2021)   Humiliation, Afraid, Rape, and Kick questionnaire    Fear of Current or Ex-Partner: No    Emotionally Abused: No    Physically Abused: No    Sexually Abused: No    Review of Systems    As above, has been experiencing progressive unstable angina over the past few months.  He has a long history of lower abdominal discomfort and is being evaluated by GI with colonoscopy pending.  He denies dyspnea, palpitations, PND, orthopnea, dizziness, syncope, edema, or early satiety.  All other systems reviewed and are otherwise negative except as noted above.   Physical Exam    VS:  BP (!) 150/80   Pulse 67   Ht '5\' 7"'$  (1.702 m)   Wt 157 lb 6.4 oz (71.4 kg)   SpO2 98%   BMI 24.65 kg/m  , BMI Body mass index is 24.65 kg/m.     Vitals:   05/12/22 1127 05/12/22 1204  BP: (!) 166/60 (!) 150/80  Pulse: 67   SpO2: 98%     GEN: Well nourished, well developed, in no acute distress. HEENT: normal. Neck: Supple, no JVD, carotid bruits, or masses. Cardiac: RRR, no murmurs, rubs, or gallops. No clubbing, cyanosis, edema.  Radials/PT 2+ and equal bilaterally.  Respiratory:  Respirations regular and unlabored, clear to auscultation bilaterally. GI: Soft, nontender, nondistended, BS + x 4. MS: no deformity or atrophy. Skin: warm and dry, no rash. Neuro:  Strength and sensation are intact. Psych: Normal affect.  Accessory Clinical Findings    ECG personally reviewed by me today -sinus rhythm, 67, first-degree AV block, PACs, junctional escape beats and intermittent Mobitz 1  (noted on attached rhythm strip).  Prior inferior infarct- no acute changes.  Lab Results  Component Value Date   WBC 7.1 12/16/2021   HGB 15.2 12/16/2021   HCT 45.5 12/16/2021   MCV 97.3 12/16/2021   PLT 181.0 12/16/2021   Lab Results  Component Value Date   CREATININE 0.80 05/11/2022   BUN 22 12/16/2021   NA 140 12/16/2021   K 4.4 12/16/2021   CL 104 12/16/2021   CO2 29 12/16/2021   Lab Results  Component Value Date   ALT 13 12/16/2021   AST 18 12/16/2021   ALKPHOS 85 12/16/2021   BILITOT 1.1 12/16/2021   Lab Results  Component Value Date   CHOL 112 12/16/2021   HDL 36.10 (L) 12/16/2021   LDLCALC 62 06/07/2017   LDLDIRECT 67.0 12/16/2021   TRIG 220.0 (H) 12/16/2021   CHOLHDL 3 12/16/2021    Lab Results  Component Value Date   HGBA1C 6.4 12/16/2021    Assessment & Plan    1.  Unstable angina/coronary artery disease: Prior history of multivessel CAD status post CABG x6 in January 2015.  Over the past 2 to 3 months, he has been experiencing progressive exertional substernal chest burning and dyspnea lasting about 5 to 10 minutes, resolving with rest.  Symptoms  are somewhat reminiscent of prior anginal equivalent.  He has not had any symptoms at rest.  We discussed his symptoms at length today along with most appropriate diagnostic approach.  Symptoms are concerning for angina and he will be best served with a diagnostic catheterization.  The patient understands that risks include but are not limited to stroke (1 in 1000), death (1 in 67), kidney failure [usually temporary] (1 in 500), bleeding (1 in 200), allergic reaction [possibly serious] (1 in 200), and agrees to proceed.  Follow-up CBC and basic metabolic panel today.  He has been holding his Eliquis in the setting of a prostate infection and Mobic usage.  I am going to add low-dose aspirin for the time being as well as Imdur 30 mg daily.  I will also provide him with a prescription for sublingual nitroglycerin to be  used as needed.  2.  Essential hypertension: Blood pressure elevated today.  Adding Imdur 30 mg daily in the setting of above.  He is already on amlodipine 10 mg daily and losartan 100 mg daily.  3.  Hyperlipidemia: LDL of 67 in May.  He is statin intolerant and managed with fish oil and Zetia.  4.  Paroxysmal atrial fibrillation: Maintaining sinus rhythm on Tikosyn therapy with stable QT.  He is anticoagulated with Eliquis but this is actually on hold in the setting of prostate infection and Mobic usage.  Continue to hold throughout the pericatheterization timeframe.    5.  Sinus node dysfunction: Patient with a history of sinus pauses, baseline bradycardia, junctional escape beats, and Mobitz 1.  No recent presyncope or syncope.  He is followed by EP with prior recommendation for pacemaker however, patient deferred.    6.  History of embolic CVA: Presumed to be in the setting of A-fib.  As above, Eliquis has been on hold with plan to resume once his prostate infection resolves.    7.  Preprocedure cardiovascular examination: Patient pending colonoscopy in the setting of a long history of lower abdominal discomfort.  In the setting of unstable angina, he is not currently a candidate for colonoscopy, with diagnostic catheterization planned first.  8.  Disposition: Follow-up lab work today.  Plan for diagnostic catheterization-first available in the setting of unstable angina.  Follow-up in clinic 2 weeks post catheterization.   Murray Hodgkins, NP 05/12/2022, 12:04 PM

## 2022-05-13 ENCOUNTER — Telehealth: Payer: Self-pay

## 2022-05-13 NOTE — Telephone Encounter (Signed)
error 

## 2022-05-13 NOTE — Telephone Encounter (Signed)
-----   Message from Jonathon Bellows, MD sent at 05/12/2022  1:31 PM EDT -----  ----- Message ----- From: Theora Gianotti, NP Sent: 05/12/2022  12:31 PM EDT To: Minna Merritts, MD; Jonathon Bellows, MD  Patient reports a 2 to 13-monthhistory of progressive exertional substernal discomfort very concerning for angina.  Pending colonoscopy next week.  Have advised that he will need diagnostic cath first and this is now planned for next Monday.

## 2022-05-13 NOTE — Telephone Encounter (Signed)
Called patient to let him know that his procedure was cancelled for the moment until we get cardiac clearance from Dr. Fletcher Anon. Patient was disappointed but I told him that he's been having unstable angina and it needs to be checked. I also told him that once he gets clearance, that we will be able to call him and reschedule. Patient understood and had no further questions.

## 2022-05-14 ENCOUNTER — Other Ambulatory Visit: Payer: Self-pay | Admitting: Cardiovascular Disease

## 2022-05-16 ENCOUNTER — Encounter: Payer: Self-pay | Admitting: Cardiovascular Disease

## 2022-05-16 ENCOUNTER — Other Ambulatory Visit: Payer: Self-pay

## 2022-05-16 ENCOUNTER — Encounter
Admission: RE | Disposition: A | Discharge status: Home / Self Care | Payer: Self-pay | Source: Home / Self Care | Attending: Cardiovascular Disease

## 2022-05-16 ENCOUNTER — Ambulatory Visit
Admission: RE | Admit: 2022-05-16 | Discharge: 2022-05-17 | Disposition: A | Discharge status: Home / Self Care | Payer: Medicare Other | Attending: Cardiovascular Disease | Admitting: Cardiovascular Disease

## 2022-05-16 DIAGNOSIS — N419 Inflammatory disease of prostate, unspecified: Secondary | ICD-10-CM | POA: Insufficient documentation

## 2022-05-16 DIAGNOSIS — I2089 Other forms of angina pectoris: Secondary | ICD-10-CM | POA: Diagnosis present

## 2022-05-16 DIAGNOSIS — Z951 Presence of aortocoronary bypass graft: Secondary | ICD-10-CM

## 2022-05-16 DIAGNOSIS — I480 Paroxysmal atrial fibrillation: Secondary | ICD-10-CM | POA: Diagnosis not present

## 2022-05-16 DIAGNOSIS — E785 Hyperlipidemia, unspecified: Secondary | ICD-10-CM | POA: Diagnosis not present

## 2022-05-16 DIAGNOSIS — I25118 Atherosclerotic heart disease of native coronary artery with other forms of angina pectoris: Secondary | ICD-10-CM | POA: Diagnosis not present

## 2022-05-16 DIAGNOSIS — Z7901 Long term (current) use of anticoagulants: Secondary | ICD-10-CM | POA: Diagnosis not present

## 2022-05-16 DIAGNOSIS — I25810 Atherosclerosis of coronary artery bypass graft(s) without angina pectoris: Secondary | ICD-10-CM

## 2022-05-16 DIAGNOSIS — I10 Essential (primary) hypertension: Secondary | ICD-10-CM | POA: Insufficient documentation

## 2022-05-16 DIAGNOSIS — I25700 Atherosclerosis of coronary artery bypass graft(s), unspecified, with unstable angina pectoris: Secondary | ICD-10-CM | POA: Insufficient documentation

## 2022-05-16 DIAGNOSIS — I495 Sick sinus syndrome: Secondary | ICD-10-CM | POA: Diagnosis not present

## 2022-05-16 DIAGNOSIS — I2582 Chronic total occlusion of coronary artery: Secondary | ICD-10-CM | POA: Insufficient documentation

## 2022-05-16 DIAGNOSIS — Z01810 Encounter for preprocedural cardiovascular examination: Secondary | ICD-10-CM

## 2022-05-16 DIAGNOSIS — Z7984 Long term (current) use of oral hypoglycemic drugs: Secondary | ICD-10-CM | POA: Diagnosis not present

## 2022-05-16 DIAGNOSIS — E119 Type 2 diabetes mellitus without complications: Secondary | ICD-10-CM | POA: Diagnosis not present

## 2022-05-16 DIAGNOSIS — M549 Dorsalgia, unspecified: Secondary | ICD-10-CM | POA: Diagnosis not present

## 2022-05-16 DIAGNOSIS — Z8673 Personal history of transient ischemic attack (TIA), and cerebral infarction without residual deficits: Secondary | ICD-10-CM | POA: Diagnosis not present

## 2022-05-16 DIAGNOSIS — I200 Unstable angina: Secondary | ICD-10-CM

## 2022-05-16 DIAGNOSIS — Z9861 Coronary angioplasty status: Secondary | ICD-10-CM | POA: Diagnosis not present

## 2022-05-16 DIAGNOSIS — Z7902 Long term (current) use of antithrombotics/antiplatelets: Secondary | ICD-10-CM | POA: Diagnosis not present

## 2022-05-16 DIAGNOSIS — R079 Chest pain, unspecified: Secondary | ICD-10-CM

## 2022-05-16 HISTORY — PX: LEFT HEART CATH AND CORS/GRAFTS ANGIOGRAPHY: CATH118250

## 2022-05-16 HISTORY — PX: CORONARY STENT INTERVENTION: CATH118234

## 2022-05-16 LAB — POCT ACTIVATED CLOTTING TIME
Activated Clotting Time: 227 seconds
Activated Clotting Time: 293 seconds
Activated Clotting Time: 359 seconds

## 2022-05-16 LAB — GLUCOSE, CAPILLARY: Glucose-Capillary: 140 mg/dL — ABNORMAL HIGH (ref 70–99)

## 2022-05-16 SURGERY — LEFT HEART CATH AND CORS/GRAFTS ANGIOGRAPHY
Anesthesia: Moderate Sedation

## 2022-05-16 MED ORDER — LORATADINE 10 MG PO TABS
10.0000 mg | ORAL_TABLET | Freq: Every day | ORAL | Status: DC
  Administered 2022-05-17: 10 mg via ORAL
  Filled 2022-05-16: qty 1

## 2022-05-16 MED ORDER — EZETIMIBE 10 MG PO TABS
10.0000 mg | ORAL_TABLET | Freq: Every day | ORAL | Status: DC
  Administered 2022-05-17: 10 mg via ORAL
  Filled 2022-05-16: qty 1

## 2022-05-16 MED ORDER — TAMSULOSIN HCL 0.4 MG PO CAPS
0.4000 mg | ORAL_CAPSULE | Freq: Every day | ORAL | Status: DC
  Administered 2022-05-17: 0.4 mg via ORAL
  Filled 2022-05-16: qty 1

## 2022-05-16 MED ORDER — NITROGLYCERIN 1 MG/10 ML FOR IR/CATH LAB
INTRA_ARTERIAL | Status: AC
  Filled 2022-05-16: qty 10

## 2022-05-16 MED ORDER — SODIUM CHLORIDE 0.9% FLUSH
3.0000 mL | Freq: Two times a day (BID) | INTRAVENOUS | Status: DC
  Administered 2022-05-17: 3 mL via INTRAVENOUS

## 2022-05-16 MED ORDER — LIDOCAINE HCL (PF) 1 % IJ SOLN
INTRAMUSCULAR | Status: DC | PRN
  Administered 2022-05-16: 2 mL

## 2022-05-16 MED ORDER — SODIUM CHLORIDE 0.9% FLUSH: 3.0000 mL | INTRAVENOUS | Status: DC | PRN

## 2022-05-16 MED ORDER — ACETAMINOPHEN 325 MG PO TABS
650.0000 mg | ORAL_TABLET | ORAL | Status: DC | PRN
  Filled 2022-05-16: qty 2

## 2022-05-16 MED ORDER — CLOPIDOGREL BISULFATE 75 MG PO TABS
ORAL_TABLET | ORAL | Status: AC
  Filled 2022-05-16: qty 8

## 2022-05-16 MED ORDER — AMLODIPINE BESYLATE 10 MG PO TABS
10.0000 mg | ORAL_TABLET | Freq: Every day | ORAL | Status: DC
  Administered 2022-05-17: 10 mg via ORAL
  Filled 2022-05-16: qty 1

## 2022-05-16 MED ORDER — CLOPIDOGREL BISULFATE 75 MG PO TABS
75.0000 mg | ORAL_TABLET | Freq: Every day | ORAL | Status: DC
  Administered 2022-05-17: 75 mg via ORAL
  Filled 2022-05-16: qty 1

## 2022-05-16 MED ORDER — HEPARIN SODIUM (PORCINE) 1000 UNIT/ML IJ SOLN
INTRAMUSCULAR | Status: AC
  Filled 2022-05-16: qty 10

## 2022-05-16 MED ORDER — TIMOLOL MALEATE 0.5 % OP SOLN: 1.0000 [drp] | Freq: Two times a day (BID) | OPHTHALMIC | Status: DC

## 2022-05-16 MED ORDER — FENTANYL CITRATE (PF) 100 MCG/2ML IJ SOLN
INTRAMUSCULAR | Status: DC | PRN
  Administered 2022-05-16: 25 ug via INTRAVENOUS

## 2022-05-16 MED ORDER — ASPIRIN 81 MG PO CHEW
CHEWABLE_TABLET | ORAL | Status: AC
  Filled 2022-05-16: qty 2

## 2022-05-16 MED ORDER — CLOPIDOGREL BISULFATE 75 MG PO TABS
ORAL_TABLET | ORAL | Status: DC | PRN
  Administered 2022-05-16: 600 mg via ORAL

## 2022-05-16 MED ORDER — ASPIRIN 81 MG PO TBEC
81.0000 mg | DELAYED_RELEASE_TABLET | Freq: Every day | ORAL | Status: DC
  Administered 2022-05-17: 81 mg via ORAL
  Filled 2022-05-16: qty 1

## 2022-05-16 MED ORDER — ASPIRIN 81 MG PO CHEW
81.0000 mg | CHEWABLE_TABLET | ORAL | Status: AC
  Administered 2022-05-16: 81 mg via ORAL

## 2022-05-16 MED ORDER — HEPARIN (PORCINE) IN NACL 1000-0.9 UT/500ML-% IV SOLN
INTRAVENOUS | Status: AC
  Filled 2022-05-16: qty 1000

## 2022-05-16 MED ORDER — NITROGLYCERIN 0.4 MG SL SUBL: 0.4000 mg | SUBLINGUAL_TABLET | SUBLINGUAL | Status: DC | PRN

## 2022-05-16 MED ORDER — LIDOCAINE HCL 1 % IJ SOLN
INTRAMUSCULAR | Status: AC
  Filled 2022-05-16: qty 20

## 2022-05-16 MED ORDER — ASPIRIN 81 MG PO CHEW
CHEWABLE_TABLET | ORAL | Status: AC
  Filled 2022-05-16: qty 1

## 2022-05-16 MED ORDER — SODIUM CHLORIDE 0.9 % IV SOLN: 250.0000 mL | INTRAVENOUS | Status: DC | PRN

## 2022-05-16 MED ORDER — SODIUM CHLORIDE 0.9 % WEIGHT BASED INFUSION
1.0000 mL/kg/h | INTRAVENOUS | Status: AC
  Administered 2022-05-16: 1 mL/kg/h via INTRAVENOUS

## 2022-05-16 MED ORDER — VERAPAMIL HCL 2.5 MG/ML IV SOLN
INTRAVENOUS | Status: AC
  Filled 2022-05-16: qty 2

## 2022-05-16 MED ORDER — NITROGLYCERIN 1 MG/10 ML FOR IR/CATH LAB
INTRA_ARTERIAL | Status: DC | PRN
  Administered 2022-05-16: 200 ug via INTRACORONARY

## 2022-05-16 MED ORDER — INFLUENZA VAC A&B SA ADJ QUAD 0.5 ML IM PRSY: 0.5000 mL | PREFILLED_SYRINGE | INTRAMUSCULAR | Status: DC

## 2022-05-16 MED ORDER — SODIUM CHLORIDE 0.9 % WEIGHT BASED INFUSION
1.0000 mL/kg/h | INTRAVENOUS | Status: DC
  Administered 2022-05-16: 1 mL/kg/h via INTRAVENOUS

## 2022-05-16 MED ORDER — IOHEXOL 300 MG/ML  SOLN
INTRAMUSCULAR | Status: DC | PRN
  Administered 2022-05-16: 155 mL

## 2022-05-16 MED ORDER — OXYCODONE-ACETAMINOPHEN 5-325 MG PO TABS
1.0000 | ORAL_TABLET | Freq: Four times a day (QID) | ORAL | Status: DC | PRN
  Administered 2022-05-16: 1 via ORAL
  Filled 2022-05-16: qty 1
  Filled 2022-05-16: qty 2

## 2022-05-16 MED ORDER — HEPARIN SODIUM (PORCINE) 1000 UNIT/ML IJ SOLN
INTRAMUSCULAR | Status: DC | PRN
  Administered 2022-05-16 (×2): 4000 [IU] via INTRAVENOUS

## 2022-05-16 MED ORDER — ACYCLOVIR 200 MG PO CAPS
800.0000 mg | ORAL_CAPSULE | Freq: Two times a day (BID) | ORAL | Status: DC
  Filled 2022-05-16 (×3): qty 4

## 2022-05-16 MED ORDER — DOFETILIDE 250 MCG PO CAPS
500.0000 ug | ORAL_CAPSULE | Freq: Two times a day (BID) | ORAL | Status: DC
  Administered 2022-05-16 – 2022-05-17 (×2): 500 ug via ORAL
  Filled 2022-05-16 (×2): qty 2

## 2022-05-16 MED ORDER — LOSARTAN POTASSIUM 50 MG PO TABS
100.0000 mg | ORAL_TABLET | Freq: Every day | ORAL | Status: DC
  Administered 2022-05-17: 100 mg via ORAL
  Filled 2022-05-16: qty 2

## 2022-05-16 MED ORDER — FENTANYL CITRATE (PF) 100 MCG/2ML IJ SOLN
INTRAMUSCULAR | Status: AC
  Filled 2022-05-16: qty 2

## 2022-05-16 MED ORDER — VERAPAMIL HCL 2.5 MG/ML IV SOLN
INTRAVENOUS | Status: DC | PRN
  Administered 2022-05-16: 2.5 mg via INTRA_ARTERIAL

## 2022-05-16 MED ORDER — HEPARIN (PORCINE) IN NACL 1000-0.9 UT/500ML-% IV SOLN
INTRAVENOUS | Status: DC | PRN
  Administered 2022-05-16 (×2): 500 mL

## 2022-05-16 MED ORDER — ONDANSETRON HCL 4 MG/2ML IJ SOLN: 4.0000 mg | Freq: Four times a day (QID) | INTRAMUSCULAR | Status: DC | PRN

## 2022-05-16 MED ORDER — MIDAZOLAM HCL 2 MG/2ML IJ SOLN
INTRAMUSCULAR | Status: AC
  Filled 2022-05-16: qty 2

## 2022-05-16 MED ORDER — ASPIRIN 81 MG PO CHEW
CHEWABLE_TABLET | ORAL | Status: DC | PRN
  Administered 2022-05-16: 162 mg via ORAL

## 2022-05-16 MED ORDER — SODIUM CHLORIDE 0.9 % WEIGHT BASED INFUSION
3.0000 mL/kg/h | INTRAVENOUS | Status: DC
  Administered 2022-05-16: 3 mL/kg/h via INTRAVENOUS

## 2022-05-16 MED ORDER — MIDAZOLAM HCL 2 MG/2ML IJ SOLN
INTRAMUSCULAR | Status: DC | PRN
  Administered 2022-05-16: 1 mg via INTRAVENOUS

## 2022-05-16 SURGICAL SUPPLY — 27 items
BALLN MINITREK RX 2.0X15 (BALLOONS) ×2
BALLN ~~LOC~~ TREK NEO RX 2.25X12 (BALLOONS) ×2
BALLN ~~LOC~~ TREK NEO RX 2.5X12 (BALLOONS) ×2
BALLOON MINITREK RX 2.0X15 (BALLOONS) ×1 IMPLANT
BALLOON ~~LOC~~ TREK NEO RX 2.25X12 (BALLOONS) ×1 IMPLANT
BALLOON ~~LOC~~ TREK NEO RX 2.5X12 (BALLOONS) ×1 IMPLANT
BAND CMPR LRG ZPHR (HEMOSTASIS) ×2
BAND ZEPHYR COMPRESS 30 LONG (HEMOSTASIS) ×1 IMPLANT
CATH 5F 110X4 TIG (CATHETERS) ×1 IMPLANT
CATH INFINITI 5 FR IM (CATHETERS) ×1 IMPLANT
CATH INFINITI 5 FR MPA2 (CATHETERS) ×1 IMPLANT
CATH LAUNCHER 6FR AL1 (CATHETERS) ×1 IMPLANT
CATH TELESCOPE 6F GEC (CATHETERS) ×1 IMPLANT
CATH VISTA GUIDE 6FR LCB (CATHETERS) ×1 IMPLANT
CATHETER LAUNCHER 6FR AL1 (CATHETERS) ×2
DRAPE BRACHIAL (DRAPES) ×1 IMPLANT
GLIDESHEATH SLEND SS 6F .021 (SHEATH) ×1 IMPLANT
GUIDEWIRE INQWIRE 1.5J.035X260 (WIRE) ×1 IMPLANT
INQWIRE 1.5J .035X260CM (WIRE) ×2
KIT ENCORE 26 ADVANTAGE (KITS) ×1 IMPLANT
PACK CARDIAC CATH (CUSTOM PROCEDURE TRAY) ×2 IMPLANT
PROTECTION STATION PRESSURIZED (MISCELLANEOUS) ×2
SET ATX SIMPLICITY (MISCELLANEOUS) ×1 IMPLANT
STATION PROTECTION PRESSURIZED (MISCELLANEOUS) ×1 IMPLANT
STENT ONYX FRONTIER 2.25X15 (Permanent Stent) ×1 IMPLANT
TUBING CIL FLEX 10 FLL-RA (TUBING) ×1 IMPLANT
WIRE RUNTHROUGH .014X180CM (WIRE) ×1 IMPLANT

## 2022-05-16 NOTE — Telephone Encounter (Signed)
Rx refill sent to pharmacy. 

## 2022-05-16 NOTE — Progress Notes (Signed)
Contacted by nursing late this evening that [name] [name] was having severe left low back pain radiating down his left leg Attempt to alleviate the pain by getting out of bed and standing was unsuccessful  Catheterization site left radial artery No hemodynamic compromise, no indication of bleeding  Etiology of pain unclear, Concern for musculoskeletal etiology, sciatica pain from being on catheterization table today He has been prescribed  1-2 Percocet every 6 hours for pain, with plan for reassessment in the morning  Signed, Esmond Plants, MD, Ph.D [facility]

## 2022-05-16 NOTE — Plan of Care (Signed)

## 2022-05-16 NOTE — Interval H&P Note (Signed)
Cath Lab Visit (complete for each Cath Lab visit)  Clinical Evaluation Leading to the Procedure:   ACS: No.  Non-ACS:    Anginal Classification: CCS III  Anti-ischemic medical therapy: Maximal Therapy (2 or more classes of medications)  Non-Invasive Test Results: No non-invasive testing performed  Prior CABG: Previous CABG      History and Physical Interval Note:  05/16/2022 11:00 AM  [name] [name]  has presented today for surgery, with the diagnosis of L Cath  Chest pain.  The various methods of treatment have been discussed with the patient and family. After consideration of risks, benefits and other options for treatment, the patient has consented to  Procedure(s): LEFT HEART CATH AND CORONARY ANGIOGRAPHY (Left) as a surgical intervention.  The patient's history has been reviewed, patient examined, no change in status, stable for surgery.  I have reviewed the patient's chart and labs.  Questions were answered to the patient's satisfaction.     [name] [name]

## 2022-05-17 ENCOUNTER — Telehealth: Payer: Self-pay | Admitting: Physician Assistant

## 2022-05-17 ENCOUNTER — Other Ambulatory Visit: Payer: Self-pay | Admitting: Physician Assistant

## 2022-05-17 ENCOUNTER — Encounter: Payer: Self-pay | Admitting: Cardiovascular Disease

## 2022-05-17 DIAGNOSIS — I2582 Chronic total occlusion of coronary artery: Secondary | ICD-10-CM | POA: Diagnosis not present

## 2022-05-17 DIAGNOSIS — I495 Sick sinus syndrome: Secondary | ICD-10-CM | POA: Diagnosis not present

## 2022-05-17 DIAGNOSIS — Z7984 Long term (current) use of oral hypoglycemic drugs: Secondary | ICD-10-CM | POA: Diagnosis not present

## 2022-05-17 DIAGNOSIS — I25810 Atherosclerosis of coronary artery bypass graft(s) without angina pectoris: Secondary | ICD-10-CM | POA: Insufficient documentation

## 2022-05-17 DIAGNOSIS — E785 Hyperlipidemia, unspecified: Secondary | ICD-10-CM | POA: Diagnosis not present

## 2022-05-17 DIAGNOSIS — E119 Type 2 diabetes mellitus without complications: Secondary | ICD-10-CM | POA: Diagnosis not present

## 2022-05-17 DIAGNOSIS — Z7902 Long term (current) use of antithrombotics/antiplatelets: Secondary | ICD-10-CM | POA: Diagnosis not present

## 2022-05-17 DIAGNOSIS — Z7901 Long term (current) use of anticoagulants: Secondary | ICD-10-CM | POA: Diagnosis not present

## 2022-05-17 DIAGNOSIS — I2089 Other forms of angina pectoris: Secondary | ICD-10-CM | POA: Diagnosis not present

## 2022-05-17 DIAGNOSIS — M549 Dorsalgia, unspecified: Secondary | ICD-10-CM | POA: Diagnosis not present

## 2022-05-17 DIAGNOSIS — I25700 Atherosclerosis of coronary artery bypass graft(s), unspecified, with unstable angina pectoris: Secondary | ICD-10-CM | POA: Diagnosis not present

## 2022-05-17 DIAGNOSIS — N419 Inflammatory disease of prostate, unspecified: Secondary | ICD-10-CM | POA: Diagnosis not present

## 2022-05-17 DIAGNOSIS — I480 Paroxysmal atrial fibrillation: Secondary | ICD-10-CM | POA: Diagnosis not present

## 2022-05-17 DIAGNOSIS — Z8673 Personal history of transient ischemic attack (TIA), and cerebral infarction without residual deficits: Secondary | ICD-10-CM | POA: Diagnosis not present

## 2022-05-17 DIAGNOSIS — I10 Essential (primary) hypertension: Secondary | ICD-10-CM | POA: Diagnosis not present

## 2022-05-17 DIAGNOSIS — I2510 Atherosclerotic heart disease of native coronary artery without angina pectoris: Secondary | ICD-10-CM

## 2022-05-17 LAB — CBC
HCT: 41.2 % (ref 39.0–52.0)
Hemoglobin: 14 g/dL (ref 13.0–17.0)
MCH: 31.9 pg (ref 26.0–34.0)
MCHC: 34 g/dL (ref 30.0–36.0)
MCV: 93.8 fL (ref 80.0–100.0)
Platelets: 147 10*3/uL — ABNORMAL LOW (ref 150–400)
RBC: 4.39 MIL/uL (ref 4.22–5.81)
RDW: 12.8 % (ref 11.5–15.5)
WBC: 7.4 10*3/uL (ref 4.0–10.5)
nRBC: 0 % (ref 0.0–0.2)

## 2022-05-17 LAB — BASIC METABOLIC PANEL
Anion gap: 7 (ref 5–15)
BUN: 17 mg/dL (ref 8–23)
CO2: 26 mmol/L (ref 22–32)
Calcium: 9.2 mg/dL (ref 8.9–10.3)
Chloride: 105 mmol/L (ref 98–111)
Creatinine, Ser: 0.79 mg/dL (ref 0.61–1.24)
GFR, Estimated: >60 mL/min (ref >60)
Glucose, Bld: 133 mg/dL — ABNORMAL HIGH (ref 70–99)
Potassium: 4.5 mmol/L (ref 3.5–5.1)
Sodium: 138 mmol/L (ref 135–145)

## 2022-05-17 LAB — MAGNESIUM: Magnesium: 2.1 mg/dL (ref 1.7–2.4)

## 2022-05-17 MED ORDER — METFORMIN HCL 850 MG PO TABS: 850.0000 mg | ORAL_TABLET | Freq: Two times a day (BID) | ORAL | 3 refills | Status: DC

## 2022-05-17 MED ORDER — CLOPIDOGREL BISULFATE 75 MG PO TABS: 75.0000 mg | ORAL_TABLET | Freq: Every day | ORAL | 0 refills | Status: DC

## 2022-05-17 MED ORDER — CLOPIDOGREL BISULFATE 75 MG PO TABS: 75.0000 mg | ORAL_TABLET | Freq: Every day | ORAL | 5 refills | Status: DC

## 2022-05-17 MED ORDER — PANTOPRAZOLE SODIUM 40 MG PO TBEC: 40.0000 mg | DELAYED_RELEASE_TABLET | Freq: Every day | ORAL | 2 refills | Status: DC

## 2022-05-17 NOTE — Discharge Instructions (Addendum)
   There are some changes to your medication list. Please review it carefully and contact our office at [phone] if you have any questions.   You are on a new medication called Plavix, the other name for this medication is clopidogrel. This is a 75 mg tablet that you will take once daily, starting on 05/18/2022 (you received your dose on 05/17/2022 prior to discharge). You will need to pick this medication up from the pharmacy (it was sent to your local [facility]). It is very important you take this medication every day. You will take this medication with an aspirin 81 mg daily. For now, your Eliquis is on hold. Once you have been cleared to resume Eliquis by Urology, we can stop aspirin with continuation of Plavix. Please contact our office if Urology approves the resumption of Eliquis so we can review this process with you over the phone.    You will need to contact your GI physician to let them know you had a heart stent placed on 05/16/2022 and cannot stop your Plavix. The colonoscopy will need to be delayed for 6 months, unless your GI physician feels this is an urgent need. If they do, we recommend the GI team contact our office for further discussion.   Do not take metformin until 05/19/2022.

## 2022-05-17 NOTE — Progress Notes (Signed)
Mail order Plavix sent in.

## 2022-05-17 NOTE — Plan of Care (Signed)

## 2022-05-17 NOTE — Telephone Encounter (Signed)
Please contact patient and advise him we have also sent in a prescription for pantoprazole 40 mg daily to his local pharmacy which should be taken with DAPT in the context of Mobic use.

## 2022-05-17 NOTE — Progress Notes (Signed)
  Transition of Care [facility]) Screening Note   Patient Details  Name: DELONTAE LAMM Date of Birth: 01-Sep-1940   Transition of [facility]) CM/SW Contact:    Alberteen Sam, LCSW Phone Number: 05/17/2022, 9:38 AM    Transition of Care Department [facility]) has reviewed patient and no TOC needs have been identified at this time. We will continue to monitor patient advancement through interdisciplinary progression rounds. If new patient transition needs arise, please place a TOC consult.  [place], [place]

## 2022-05-17 NOTE — Discharge Summary (Signed)
Discharge Summary    Patient ID: [name] [name]  MRN: [id number], DOB/AGE: 1941/02/15 81 y.o.  Admit Date: 05/16/2022 Discharge Date: 05/17/2022  Primary Care Provider: Tonia Ghent, MD Primary Cardiologist: Dr. Rockey Situ, MD  Discharge Diagnoses    Principal Problem:   Effort angina Active Problems:   Hyperlipidemia LDL goal <70   S/P CABG x 6   PAF (paroxysmal atrial fibrillation) (HCC)   Preop cardiovascular exam   HTN (hypertension)   Sinus node dysfunction (HCC)   CAD (coronary artery disease) of bypass graft   Back pain   History of CVA (cerebrovascular accident)   Allergies Allergies  Allergen Reactions   Fenofibrate Other (See Comments)    Muscle weakness/pain   Fire Ant     Allergic reaction   Statins Other (See Comments)    MYALGIA, muscle pain     History of Present Illness     81 year old male with history of CAD s/p 6-vessel CABG in 2015, persistent Afib on Eliquis, embolic CVA, PFO, DM2, HTN, and HLD who presented to [facility] on 10/9 for outpatient diagnostic LHC.   Following his CABG in 2015, he had postoperative Afib that resolved after short course of amiodarone and he only required warfarin for a brief period of time.  He had palpitations in early 2017 with Holter monitoring at that time showing sinus rhythm with PACs.  In spring 2018, he was evaluated by neurology in the setting of falls.  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  It was felt that this was most likely explained by paroxysmal Afib and he was placed on Eliquis 5 mg twice daily.  He was subsequently found to be in Afib at cardiology clinic in 05/2017 and referred to the Afib clinic.  Recommendation was made for Tikosyn initiation, though patient deferred.  He subsequently underwent DCCV in 08/2017, which was initially successful however, he had return of Afib and associated fatigue within approximately 4 days.  He was  referred back to Afib clinic and subsequently was admitted and loaded with Tikosyn.  In early 2022, he suffered a syncopal episode.  He subsequently wore an event monitor which showed predominantly sinus bradycardia at 53 bpm with a range of 35 to 211 bpm.  He had 20 brief runs of SVT and a 4% Afib burden.  He also had episodic junctional rhythm and 16 pauses lasting up to 3.6 seconds.  He followed up with electrophysiology [name]) in March 2022 and was offered permanent pacemaker insertion, but patient wished to defer.     He was seen in EP clinic in June 2023, at which time he was doing well.  Following this, he was dealing with a prostate infection and he was advised to hold his Eliquis while taking Mobic for discomfort.  He has a long history of lower abdominal discomfort and recently had a CT of the abdomen and pelvis which showed diverticulosis without diverticulitis, small umbilical hernia containing fat, minimal prostate enlargement, and possible lower extal wall thickening vs artifact.  No acute findings were noted.  He was last seen in our office on 05/12/2022, noting a 2-3 month history of progressive exertional substernal chest burning associated with dyspnea, lasting about 10 minutes, and resolving with rest.  Symptoms were somewhat reminiscent of prior angina.  Given symptoms, LHC was recommended.   Hospital Course     Consultants: Cardiac rehab   He presented to [facility]  on 05/16/2022 for LHC, which showed severe underlying three-vessel coronary artery disease with patent grafts including LIMA to LAD, SVG to OM2/OM3, SVG to RCA and SVG to second diagonal. The SVG to second diagonal had severe anastomosis stenosis which was the likely culprit for angina. Normal LV systolic function and mildly elevated left ventricular end-diastolic pressure. He underwent successful angioplasty and drug-eluting stent placement to the anastomosis of SVG to second diagonal extending into the native vessel. Difficult  procedure due to very fibrotic lesion with difficulty delivering a stent.  A guide extension had to be used.  Overnight, he complained of left-sided low back pain that radiated to his legs. Pain was resolved with getting out of bed and sitting in the recliner. Currently, without complaints and feels well. Post-cath vitals and labs are stable. EKG this morning showed sinus rhythm with PACs with a stable QT.    CAD s/p 6-vessel CABG s/p PCI:  -Currently, without symptoms concerning for  angina or decompensation -Continue DAPT with ASA and Plavix daily for now -Once he is cleared to resume Eliquis, by Urology for prostate infection, recommend he stop ASA at that time with continuation of Plavix -Plavix should be continued without interruption for at least 6 months -Not on beta blocker secondary to history of sinus node dysfunction  -Aggressive risk factor modification  -Post cath instructions  -Cardiac rehab  -Will need to defer planned colonoscopy   PAF:  -Maintaining sinus rhythm  -Continue Tikosyn -Not on beta blocker with history of sinus node dysfunction  -Not currently on Eliquis in the setting of medication interaction with prostate infection -Follow up with Urology and Cardiology to further discuss resumption of Eliquis -He is aware of stroke risk off OAC -QT stable at discharge  -Potassium and magnesium at goal  -CHADS2VASc at least 7 (HTN, age x 2, DM, CVA x 2, vascular disease)  -Continue PTA Eliquis 5 mg bid (he does not meet reduced dosing criteria)   Sinus node dysfunction:  -He has a history of sinus pauses, baseline bradycardia, junctional escape beats, and Mobitz type I  -No recent presyncope or syncope  -No evidence of high-grade AV block on tele while admitted  -Followed by EP with prior recommendation for pacemaker, though patient declined  -Continue to avoid beta blocker  -Follow up with EP as directed   History of embolic CVA:  -Presumed to be in the setting  of Afib  -Resume PTA Elqiuis when able as above, for now DAPT -Intolerant to statin, PTA Zetia   HTN:  -Blood pressure well controlled -Continue amlodipine and losartan  HLD:  -LDL 67 in 12/2021  -Intolerant to statin  -Continue PTA fish oil and Zetia  DM: -Resume metformin 48 hours post LHC -Follow up with PCP  Back pain: -Appears to be in the context of laying in the hospital bed -Resolved with getting out of bed -No red flag symptoms -If symptoms return, follow up with PCP  Preprocedure risk stratification: -Unless urgent, his colonoscopy will need to be deferred for 6 months to allow for uninterrupted Plavix use following PCI/DES -If an urgent need is indicated, recommend GI reach out to his primary cardiologist for further discussion     The patient's left radial cath site has been examined is healing well without issues at this time. The patient has been seen by Dr. Saunders Revel and felt to be stable for discharge today. All follow up appointments have been made. Discharge medications are listed below. Prescriptions have been reviewed with  the patient and sent in to their pharmacy.  _____________  Discharge Vitals Blood pressure (!) 152/49, pulse (!) 40, temperature 97.6 F (36.4 C), temperature source Oral, resp. rate 16, height _0  (1.702 m), weight 70.3 kg, SpO2 100 %.  Filed Weights   05/16/22 1018  Weight: 70.3 kg    Physical Exam   GEN: No acute distress.   Neck: No JVD. Cardiac: RRR, no murmurs, rubs, or gallops. Left radial arteriotomy site is without active bleeding, swelling, warmth, erythema, or TTP. Radial pulse 2+ proximal and distal to the arteriotomy site.  Respiratory: Clear to auscultation bilaterally.  GI: Soft, nontender, non-distended.   MS: No edema; No deformity. Neuro:  Alert and oriented x 3; Nonfocal.  Psych: Normal affect.  Labs & Radiologic Studies    CBC Recent Labs    05/17/22 0513  WBC 7.4  HGB 14.0  HCT 41.2  MCV 93.8  PLT 147*    Basic Metabolic Panel Recent Labs    05/17/22 0513  NA 138  K 4.5  CL 105  CO2 26  GLUCOSE 133*  BUN 17  CREATININE 0.79  CALCIUM 9.2  MG 2.1   Liver Function Tests No results for input(s): "AST", "ALT", "ALKPHOS", "BILITOT", "PROT", "ALBUMIN" in the last 72 hours. No results for input(s): "LIPASE", "AMYLASE" in the last 72 hours. Cardiac Enzymes No results for input(s): "CKTOTAL", "CKMB", "CKMBINDEX", "TROPONINI" in the last 72 hours. BNP Invalid input(s): "POCBNP" D-Dimer No results for input(s): "DDIMER" in the last 72 hours. Hemoglobin A1C No results for input(s): "HGBA1C" in the last 72 hours. Fasting Lipid Panel No results for input(s): "CHOL", "HDL", "LDLCALC", "TRIG", "CHOLHDL", "LDLDIRECT" in the last 72 hours. Thyroid Function Tests No results for input(s): "TSH", "T4TOTAL", "T3FREE", "THYROIDAB" in the last 72 hours.  Invalid input(s): "FREET3" _____________   Diagnostic Studies/Procedures   LHC 05/16/2022:   Mid LAD lesion is 100% stenosed.   Ost Cx to Prox Cx lesion is 80% stenosed.   Mid Cx lesion is 90% stenosed.   Prox RCA lesion is 100% stenosed.   1st Mrg lesion is 100% stenosed.   Mid Cx to Dist Cx lesion is 90% stenosed.   Origin lesion is 30% stenosed.   Insertion lesion is 99% stenosed.   2nd Diag lesion is 50% stenosed.   Dist LAD lesion is 60% stenosed.   A drug-eluting stent was successfully placed using a [place] 2.25X15.   Post intervention, there is a 10% residual stenosis.   SVG graft was visualized by angiography and is normal in caliber.   SVG graft was visualized by angiography and is normal in caliber.   LIMA graft was visualized by angiography and is normal in caliber.   The graft exhibits no disease.   The graft exhibits minimal luminal irregularities.   The graft exhibits no disease.   The left ventricular systolic function is normal.   LV end diastolic pressure is mildly elevated.   The left ventricular  ejection fraction is 55-65% by visual estimate.   1.  Severe underlying three-vessel coronary artery disease with patent grafts including LIMA to LAD, SVG to OM 2/OM 3, SVG to RCA and SVG to second diagonal.  The SVG to second diagonal has severe anastomosis stenosis which is the likely culprit for angina. 2.  Normal LV systolic function and mildly elevated left ventricular end-diastolic pressure. 3.  Successful angioplasty and drug-eluting stent placement to the anastomosis of SVG to second diagonal extending into the native  vessel.  Difficult procedure due to very fibrotic lesion with difficulty delivering a stent.  A guide extension had to be used.   Recommendations: We will use dual antiplatelet therapy for now.  Once Eliquis is resumed, aspirin can be discontinued.  Eliquis has been on hold due to interaction with an antibiotic that he takes for his prostate infection. The patient can likely be discharged home tomorrow.   Diagnostic Dominance: Right Left Anterior Descending  Mid LAD lesion is 100% stenosed.  Dist LAD lesion is 60% stenosed.    Second Diagonal Branch  2nd Diag lesion is 50% stenosed.    Left Circumflex  Ost Cx to Prox Cx lesion is 80% stenosed.  Mid Cx lesion is 90% stenosed.  Mid Cx to Dist Cx lesion is 90% stenosed.    First Obtuse Marginal Branch  Vessel is small in size.  1st Mrg lesion is 100% stenosed.    Right Coronary Artery  Prox RCA lesion is 100% stenosed.    Saphenous Graft To 2nd Mrg  SVG graft was visualized by angiography and is normal in caliber. The graft exhibits no disease.    Y-graft Branch To 3rd Mrg    Graft To 2nd Diag  Origin lesion is 30% stenosed.  Insertion lesion is 99% stenosed. The lesion is type C. The lesion was not previously treated .    Saphenous Graft To Dist RCA  SVG graft was visualized by angiography and is normal in caliber. The graft exhibits minimal luminal irregularities.    LIMA LIMA Graft To Dist LAD  LIMA  graft was visualized by angiography and is normal in caliber. The graft exhibits no disease.    Intervention   Insertion lesion (Graft To 2nd Diag)  Stent  Lesion length: 12 mm. CATH VISTA GUIDE 6FR LCB guide catheter was inserted. Lesion crossed with guidewire using a WIRE RUNTHROUGH .[id number]. Pre-stent angioplasty was performed using a BALLN MINITREK RX 2.0X15. Maximum pressure: 10 atm. Inflation time: 20 sec. A drug-eluting stent was successfully placed using a [place] 2.25X15. Maximum pressure: 16 atm. Inflation time: 20 sec. Post-stent angioplasty was performed using a BALLN [place] TREK NEO RX 2.5X12. Maximum pressure: 16 atm. Inflation time: 20 sec. I initially used an AL-1 guide but was not able to fully engage the graft with this. Thus, I switched to an LCB guide. The lesion was crossed easily with the wire but the wire was occlusive. Crossing with the balloon was difficult due to significantly fibrotic lesion. I initially used a 2 mm balloon to predilate the lesion. I could not pass a stent. Thus, I went back with a 2-5 noncompliant balloon and dilated to high pressure. In spite of that, a stent could not be delivered likely due to minimal support from the guide. Thus, I decided to use a telescope guide extension which facilitated advancement of the stent. The lesion was very fibrotic and could not be fully expanded and spite of high-pressure balloon noncompliant balloon. I had to accept and percent residual stenosis.  Post-Intervention Lesion Assessment  The intervention was successful. Pre-interventional TIMI flow is 2. Post-intervention TIMI flow is 3. No complications occurred at this lesion.  There is a 10% residual stenosis post intervention.     Left Heart  Left Ventricle The left ventricular size is normal. The left ventricular systolic function is normal. LV end diastolic pressure is mildly elevated. The left ventricular ejection fraction is 55-65% by visual estimate. No  regional wall motion abnormalities.  Coronary Diagrams  Diagnostic Dominance: Right  Intervention       _____________  Disposition   Pt is being discharged home today in good condition.  Follow-up Plans & Appointments     Follow-up Information     Theora Gianotti, NP Follow up on 06/01/2022.   Specialties: Nurse Practitioner, Cardiology, Radiology Why: Appointment time 10:05 Contact information: [address] [place] [address] [phone]                Discharge Instructions     AMB Referral to Cardiac Rehabilitation - Phase II   Complete by: As directed    Diagnosis:  Stable Angina Coronary Stents     After initial evaluation and assessments completed: Virtual Based Care may be provided alone or in conjunction with Phase 2 Cardiac Rehab based on patient barriers.: Yes   Intensive Cardiac Rehabilitation (ICR) [place] location only OR Traditional Cardiac Rehabilitation (TCR) *If criteria for ICR are not met will enroll in TCR [facility] only): Yes   Diet - low sodium heart healthy   Complete by: As directed    Increase activity slowly   Complete by: As directed        Discharge Medications   Allergies as of 05/17/2022       Reactions   Fenofibrate Other (See Comments)   Muscle weakness/pain   Fire Ant    Allergic reaction   Statins Other (See Comments)   MYALGIA, muscle pain        Medication List     STOP taking these medications    Eliquis 5 MG Tabs tablet Generic drug: apixaban       TAKE these medications    acyclovir 800 MG tablet Commonly known as: ZOVIRAX Take 1 tablet (800 mg total) by mouth 2 (two) times daily.   amLODipine 10 MG tablet Commonly known as: NORVASC Take 1 tablet (10 mg total) by mouth daily.   aspirin EC 81 MG tablet Take 1 tablet (81 mg total) by mouth daily. Swallow whole.   clopidogrel 75 MG tablet Commonly known as: PLAVIX Take 1 tablet (75 mg total) by mouth daily with  breakfast.   COD LIVER OIL PO Take 1 capsule by mouth 2 (two) times daily. 10 mcg   dofetilide 500 MCG capsule Commonly known as: TIKOSYN TAKE ONE CAPSULE BY MOUTH TWICE A DAY   ezetimibe 10 MG tablet Commonly known as: ZETIA Take 1 tablet (10 mg total) by mouth daily.   gabapentin 600 MG tablet Commonly known as: NEURONTIN Take 0.5-1 tablets (300-600 mg total) by mouth 3 (three) times daily as needed.   isosorbide mononitrate 30 MG 24 hr tablet Commonly known as: IMDUR Take 1 tablet (30 mg total) by mouth daily.   loratadine 10 MG tablet Commonly known as: CLARITIN Take 1 tablet (10 mg total) by mouth daily.   losartan 100 MG tablet Commonly known as: COZAAR Take 1 tablet (100 mg total) by mouth daily.   meloxicam 15 MG tablet Commonly known as: MOBIC Take 15 mg by mouth daily.   metFORMIN 850 MG tablet Commonly known as: GLUCOPHAGE Take 1 tablet (850 mg total) by mouth 2 (two) times daily with a meal. Resume 05/19/2022. What changed: additional instructions   nitroGLYCERIN 0.4 MG SL tablet Commonly known as: NITROSTAT Place 1 tablet (0.4 mg total) under the tongue every 5 (five) minutes as needed for chest pain (max 3 doses).   SALMON OIL PO Take 1,000 mg by mouth.  1 twice daily   tamsulosin 0.4 MG Caps capsule Commonly known as: FLOMAX Take 0.4 mg by mouth daily.   timolol 0.5 % ophthalmic solution Commonly known as: BETIMOL Place 1 drop into both eyes 2 (two) times daily.   valACYclovir 1000 MG tablet Commonly known as: VALTREX Take 1 tablet (1,000 mg total) by mouth 2 (two) times daily as needed.        Aspirin prescribed at discharge?  Yes High Intensity Statin Prescribed? (Lipitor 40-84m or Crestor 20-441m: No: Intolerant to statin therapy, on Zetia with LDL at goal Beta Blocker Prescribed? No: sinus node dysfunction  For EF <40%, was ACEI/ARB Prescribed? No: No indicated with EF > 40% ADP Receptor Inhibitor Prescribed? (i.e. Plavix  etc.-Includes Medically Managed Patients): Yes For EF <40%, Aldosterone Inhibitor Prescribed? No: Not indicated with EF > 40% Was EF assessed during THIS hospitalization? Yes Was Cardiac Rehab II ordered? (Included Medically managed Patients): Yes   Outstanding Labs/Studies   None.  Duration of Discharge Encounter   Greater than 30 minutes including physician time.  Signed, RyRise MuPA-C [facility] Pager: [phone]/05/2022, 8:38 AM

## 2022-05-17 NOTE — Progress Notes (Signed)
Added Protonix 40 mg daily due to chronic Mobic use and with addition of DAPT

## 2022-05-17 NOTE — Telephone Encounter (Signed)
Left voicemail message to call back for review of recommendations.  

## 2022-05-18 ENCOUNTER — Telehealth: Payer: Self-pay

## 2022-05-18 LAB — LIPOPROTEIN A (LPA): Lipoprotein (a): 19.2 nmol/L (ref <75.0)

## 2022-05-18 NOTE — Telephone Encounter (Signed)
Called patient to let him know that his CT Scan showed thickening on his rectum and that Dr. Vicente Males will evaluate it once he could proceed with his colonoscopy. However, patient had to cancel his colonoscopy right now as it was recommended by his cardiologist-Dr. Fletcher Anon. But once he gets cardiac clearance from Dr. Fletcher Anon, he will give [place] a call back to reschedule his colonoscopy. Patient also wanted me to inform Dr. Vicente Males that he really liked him and that he will definitely call to proceed with his recommendation.

## 2022-05-18 NOTE — Telephone Encounter (Signed)
-----   Message from Jonathon Bellows, MD sent at 05/18/2022  8:55 AM EDT ----- Inform thickening seen in the rectum which will be evaluated during colonoscopy

## 2022-05-18 NOTE — Progress Notes (Signed)
Inform thickening seen in the rectum which will be evaluated during colonoscopy

## 2022-05-23 MED ORDER — PANTOPRAZOLE SODIUM 40 MG PO TBEC: 40.0000 mg | DELAYED_RELEASE_TABLET | Freq: Every day | ORAL | 3 refills | Status: DC

## 2022-05-23 NOTE — Telephone Encounter (Signed)
Reviewed recommendations from provider to add pantoprazole to his regimen. All questions were answered and he had no further concerns at this time.

## 2022-05-23 NOTE — Telephone Encounter (Signed)
Patient returned RN's call and was told his prescription for pantoprazole (PROTONIX) 40 MG tablet was sent in.  Patient would like a call back to discuss why he is taking this medication.

## 2022-05-23 NOTE — Telephone Encounter (Signed)
Left detailed voicemail message and instructions to call back if any further questions.

## 2022-05-23 NOTE — Addendum Note (Signed)
Addended by: Valora Corporal on: 05/23/2022 09:07 AM   Modules accepted: Orders

## 2022-06-01 ENCOUNTER — Ambulatory Visit: Payer: Medicare Other | Attending: Nurse Practitioner | Admitting: Nurse Practitioner

## 2022-06-01 ENCOUNTER — Encounter: Payer: Self-pay | Admitting: Nurse Practitioner

## 2022-06-01 VITALS — BP 158/62 | HR 66 | Ht 67.0 in | Wt 158.6 lb

## 2022-06-01 DIAGNOSIS — I10 Essential (primary) hypertension: Secondary | ICD-10-CM

## 2022-06-01 DIAGNOSIS — I2510 Atherosclerotic heart disease of native coronary artery without angina pectoris: Secondary | ICD-10-CM | POA: Diagnosis not present

## 2022-06-01 DIAGNOSIS — Z01810 Encounter for preprocedural cardiovascular examination: Secondary | ICD-10-CM

## 2022-06-01 DIAGNOSIS — E785 Hyperlipidemia, unspecified: Secondary | ICD-10-CM

## 2022-06-01 DIAGNOSIS — I480 Paroxysmal atrial fibrillation: Secondary | ICD-10-CM | POA: Diagnosis not present

## 2022-06-01 DIAGNOSIS — Z79899 Other long term (current) drug therapy: Secondary | ICD-10-CM

## 2022-06-01 MED ORDER — SPIRONOLACTONE 25 MG PO TABS: 12.5000 mg | ORAL_TABLET | Freq: Every day | ORAL | 3 refills | Status: DC

## 2022-06-01 MED ORDER — APIXABAN 5 MG PO TABS: 5.0000 mg | ORAL_TABLET | Freq: Two times a day (BID) | ORAL | 3 refills | Status: DC

## 2022-06-01 NOTE — Progress Notes (Signed)
Office Visit    Patient Name: [name] [name] Date of Encounter: 06/01/2022  Primary Care Provider:  Tonia Ghent, MD Primary Cardiologist:  Ida Rogue, MD  Chief Complaint    [name] [name]. [name] [name] is an 81 y.o. male with a history of CAD s/p CABG x6 in 2015, DMII, HTN, HLD, PFO, embolic CVA, and paroxsymal atrial fibrillation who presents today for a follow-up related to diagnostic cardiac catheterization with DES placement on 05/16/22.    Past Medical History    Past Medical History:  Diagnosis Date   Anxiety    Back pain    Coronary artery disease 08/21/2013   a. 08/2013 s/p CABG x 6; b. 05/2022 PCI: LAD 160m 60d, D2 50, LCX 80ost, [id number] OM1 100, RCA 100p, VG->OM2->OM3 nl, VG->D2 30ost, 99d @ Diag insertion (2.25x15 Onyx Frontier DES), VG->RCA nl, LIMA->LAD nl. EF 55-65%.   Diabetes mellitus    Diabetic neuropathy (HCC)    Diastolic dysfunction    a. 02/2017 Echo: EF 55-60%, no rwma, Gr1 DD, mildly dil Ao Root/RV. + PFO.   Diverticulosis    Diverticulosis    Embolic stroke ([place]  a. 04/2017 MRI in setting of freq falls: small subacute infarction of bilat centrum semi-ovale consistent w/ embolic event-->Eliquis.   Erectile dysfunction    Family history of anesthesia complication    ' they cant wake my brother very easy"   Fatty liver    Flank pain    GERD (gastroesophageal reflux disease)    History of bronchitis    History of chicken pox    HSV infection    ocular symptoms and oral lesions   Hypercholesterolemia    Hypertension    Kidney stones    Osteoarthritis    PAF (paroxysmal atrial fibrillation) ([place]  a. Had post-op AF 08/2013; b. 09/2015 Holter: PAC's no AF; c. 05/2017 Noted to be in AFib-->Eliquis (CHA2DS2VASc = 7); d. 08/2017 s/p DCCV-->ERAF-->Tikosyn added; e. 08/2020 Zio: 4% Afib burden.   PFO (patent foramen ovale)    a. 02/2017 Echo: + PFO.   Sinus node dysfunction (HCC)    a. 08/2020 Zio: Predominantly sinus bradycardia at 53 (35-211).  20 SVT runs.   1 run of nonsustained VT x4 beats.  4% A-fib burden.  16 pauses lasting up to 3.6 seconds.  Intermittent junctional rhythm.  Patient declined pacemaker.   Past Surgical History:  Procedure Laterality Date   CARDIAC CATHETERIZATION  08/21/2013   DR MCAAngelena FormCARDIOVERSION N/A 08/24/2017   Procedure: CARDIOVERSION;  Surgeon: GolMinna MerrittsD;  Location: [facility];  Service: Cardiovascular;  Laterality: N/A;   CHORed LionA 08/23/2013   Procedure: CORONARY ARTERY BYPASS GRAFTING (CABG);  Surgeon: EdwGrace IsaacD;  [facility];  Laterality: N/A;  CABG times six utilizing the left internal mammary artery and the right greater saphenous vein harvested endoscopically   CORONARY STENT INTERVENTION N/A 05/16/2022   Procedure: CORONARY STENT INTERVENTION;  Surgeon: AriWellington HampshireD;  [facility];  Service: Cardiovascular;  Laterality: N/A;   ENDOVEIN HARVEST OF GREATER SAPHENOUS VEIN Right 08/23/2013   Procedure: ENDOVEIN HARVEST OF GREATER SAPHENOUS VEIN;  Surgeon: EdwGrace IsaacD;  [facility] [place];  Laterality: Right;   HERNIA REPAIR     X3   INTRAOPERATIVE TRANSESOPHAGEAL ECHOCARDIOGRAM N/A 08/23/2013   Procedure: INTRAOPERATIVE TRANSESOPHAGEAL  ECHOCARDIOGRAM;  Surgeon: Grace Isaac, MD;  Location: [place];  Service: Open Heart Surgery;  Laterality: N/A;   LEFT HEART CATH AND CORS/GRAFTS ANGIOGRAPHY N/A 05/16/2022   Procedure: LEFT HEART CATH AND CORS/GRAFTS ANGIOGRAPHY;  Surgeon: Wellington Hampshire, MD;  Location: [place] [facility];  Service: Cardiovascular;  Laterality: N/A;   LEFT HEART CATHETERIZATION WITH CORONARY ANGIOGRAM N/A 08/21/2013   Procedure: LEFT HEART CATHETERIZATION WITH CORONARY ANGIOGRAM;  Surgeon: Burnell Blanks, MD;  Location: [facility] CATH LAB;  Service: Cardiovascular;  Laterality: N/A;    Allergies  Allergies  Allergen  Reactions   Fenofibrate Other (See Comments)    Muscle weakness/pain   Fire Ant     Allergic reaction   Statins Other (See Comments)    MYALGIA, muscle pain    History of Present Illness    [name] [name] is an 81 y.o. male with a history of CAD s/p CABG x6 in 2015, DMII, HTN, HLD, PFO, embolic CVA, and paroxysmal atrial fibrillation.  In 2015, following bypass surgery, he had postoperative atrial fibrillation that was resolved after a short course of amiodarone.  He required warfarin for a brief period during this time.  He presented again in 2017 with palpitations and a Holter monitor at that time showed sinus rhythm with PACs.  In spring 2018, he present to neurology in the setting of multiple falls.  An MRI in 04/2017 showed small, subacute infarcts of the bilateral centrum semiovale consistent with prior embolic event.  It was suggested at that time that this was a result of paroxsymal atrial fibrillation and was placed on Eliquis 5 mg twice daily.  In October 2018, he was seen in a cardiology clinic and was found to be in atrial fibrillation.  He was referred to the A-fib clinic in which it was recommended to initiate Tikosyn but he deferred.  He subsequently underwent cardioversion in 08/2017 that was initially successful but he had return of afib with fatigue within 4 days.  He was referred back to the afib clinic, was subsequently admitted and loaded with Tikosyn.  In January 2022, he suffered from a syncopal episode and was subsequently ordered an event monitor which predominantly showed sinus bradycardia at 53 bpm with a range of 35 to 211 bpm.  He had 20 brief runs of SVT and a 4% afib burden.  He also had episodic junctional rhythm and 16 pauses lasting up to 3.6 seconds.  He followed up with electrophysiology (Dr. [name]) in 10/2021 and was offered a permanent pacemaker but he deferred.    He was last seen in the clinic on 05/12/22 and was dealing with an ongoing prostate infection and lower  abdominal pain in which he was advised to discontinue Eliquis while taking Mobic for discomfort.  He had complaints of 2-3 months of progressive exertional substernal chest burning associated with dyspnea that lasted 10 minutes and was relieved with rest. He was started on aspirin and Imdur and it was recommended he undergo diagnostic catheterization.  This was completed on 05/16/22 and showed severe underlying three-vessel coronary artery disease with patent grafts including LIMA to LAD, SVG to OM 2/OM 3, SVG to RCA and SVG to second diagonal.  The SVG to second diagonal had severe dzs @ the anastomosis to the D2, which was successfully treated w/ a DES.  He was placed on DAPT post-procedure w/ plan to stop ASA once he resumes eliquis.  Protonix was initiated for prevention of GI bleeding.   Since  discharge patient states that his symptoms of chest burning and dyspnea have resolved.  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  The cath site on his left wrist has healed well.  His blood pressure in the office today is elevated, patient states that he does not check his blood pressure at home.  He states his wife "tries to cook healthy" and he does not consume much salt at home.  He will occasionally eat fast food with his grandchildren.  He denies chest pain, palpitations, dyspnea, PND, orthopnea, dizziness, syncope, edema, or early satiety.   Home Medications    Current Outpatient Medications  Medication Sig Dispense Refill   acyclovir (ZOVIRAX) 800 MG tablet Take 1 tablet (800 mg total) by mouth 2 (two) times daily. 180 tablet 3   amLODipine (NORVASC) 10 MG tablet Take 1 tablet (10 mg total) by mouth daily. 90 tablet 3   apixaban (ELIQUIS) 5 MG TABS tablet Take 1 tablet (5 mg total) by mouth 2 (two) times daily. 180 tablet 3   clopidogrel (PLAVIX) 75 MG tablet Take 1 tablet (75 mg total) by mouth daily. 30 tablet 5   COD LIVER OIL PO Take 1  capsule by mouth 2 (two) times daily. 10 mcg     dofetilide (TIKOSYN) 500 MCG capsule TAKE ONE CAPSULE BY MOUTH TWICE A DAY 180 capsule 2   ezetimibe (ZETIA) 10 MG tablet Take 1 tablet (10 mg total) by mouth daily. 90 tablet 3   gabapentin (NEURONTIN) 600 MG tablet Take 0.5-1 tablets (300-600 mg total) by mouth 3 (three) times daily as needed. 270 tablet 1   loratadine (CLARITIN) 10 MG tablet Take 1 tablet (10 mg total) by mouth daily.     losartan (COZAAR) 100 MG tablet Take 1 tablet (100 mg total) by mouth daily. 90 tablet 2   meloxicam (MOBIC) 15 MG tablet Take 15 mg by mouth daily.     metFORMIN (GLUCOPHAGE) 850 MG tablet Take 1 tablet (850 mg total) by mouth 2 (two) times daily with a meal. Resume 05/19/2022. 180 tablet 3   nitroGLYCERIN (NITROSTAT) 0.4 MG SL tablet Place 1 tablet (0.4 mg total) under the tongue every 5 (five) minutes as needed for chest pain (max 3 doses). 25 tablet 6   Omega-3 Fatty Acids (SALMON OIL PO) Take 1,000 mg by mouth. 1 twice daily     pantoprazole (PROTONIX) 40 MG tablet Take 1 tablet (40 mg total) by mouth daily. 90 tablet 3   tamsulosin (FLOMAX) 0.4 MG CAPS capsule Take 0.4 mg by mouth daily.     timolol (BETIMOL) 0.5 % ophthalmic solution Place 1 drop into both eyes 2 (two) times daily.     valACYclovir (VALTREX) 1000 MG tablet Take 1 tablet (1,000 mg total) by mouth 2 (two) times daily as needed. 60 tablet 1   spironolactone (ALDACTONE) 25 MG tablet Take 0.5 tablets (12.5 mg total) by mouth daily. 15 tablet 3   No current facility-administered medications for this visit.     Review of Systems    Patient has been feeling well since catheterization.  He does have some lower abdominal pain that has been chronic and is being managed by GI.  He is awaiting future colonoscopy.  He denies chest pain, palpitations,  dyspnea, PND, orthopnea, dizziness, syncope, edema, or early satiety.  All other systems reviewed and are otherwise negative except as noted above.  Physical Exam    VS:  BP (!) 158/62   Pulse 66   Ht '5\' 7"'$  (1.702 m)   Wt 158 lb 9.6 oz (71.9 kg)   SpO2 98%   BMI 24.84 kg/m  , BMI Body mass index is 24.84 kg/m.     Vitals:   06/01/22 0925 06/01/22 1206  BP: (!) 176/75 (!) 158/62  Pulse: 66   SpO2: 98%     GEN: Well nourished, well developed, in no acute distress. HEENT: normal. Neck: Supple, no JVD, carotid bruits, or masses. Cardiac: Irregular rhythm, no murmurs, rubs, or gallops. No clubbing, cyanosis, edema.  Radials/PT 2+ and equal bilaterally.  L radial cath site w/o bleeding/bruit/hematoma. Respiratory:  Respirations regular and unlabored, clear to auscultation bilaterally. GI: Soft, nontender, nondistended, BS + x 4. MS: no deformity or atrophy. Skin: warm and dry, no rash. Neuro:  Strength and sensation are intact. Psych: Normal affect.  Accessory Clinical Findings    ECG personally reviewed by me today - regular sinus rhythm, 1st degree AV block, PACs, junctional escape beats, prolonged QTc (475 - manually calculated)- no acute changes.  Lab Results  Component Value Date   WBC 7.4 05/17/2022   HGB 14.0 05/17/2022   HCT 41.2 05/17/2022   MCV 93.8 05/17/2022   PLT 147 (L) 05/17/2022   Lab Results  Component Value Date   CREATININE 0.79 05/17/2022   BUN 17 05/17/2022   NA 138 05/17/2022   K 4.5 05/17/2022   CL 105 05/17/2022   CO2 26 05/17/2022   Lab Results  Component Value Date   ALT 13 12/16/2021   AST 18 12/16/2021   ALKPHOS 85 12/16/2021   BILITOT 1.1 12/16/2021   Lab Results  Component Value Date   CHOL 112 12/16/2021   HDL 36.10 (L) 12/16/2021   LDLCALC 62 06/07/2017   LDLDIRECT 67.0 12/16/2021   TRIG 220.0 (H) 12/16/2021   CHOLHDL 3 12/16/2021    Lab Results  Component Value Date   HGBA1C 6.4 12/16/2021    Assessment & Plan    1.  Coronary artery disease: History of multivessel CAD s/p CABG x6 in January 2015.  He was having frequent episodes of chest burning and dyspnea on  exertion earlier in the year and it was recommended he undergo diagnostic cardiac catheterization.  This was completed on 05/16/22 and showed severe underlying three-vessel coronary artery disease with 4/5 patent grafts as the VG  D2 had severe dzs @ the anastomosis to the D2, and was successfully treated w/ DES.  Since the procedure, patient states that his symptoms of chest burning and dyspnea on exertion have resolved.  He is exercising @ the [facility] 3x/wk w/o symptoms or limitations.   He is currently on DAPT w/ ASA/plavix.  He has historically been on eliquis but has been holding this recently due to ongoing mobic therapy.  We discussed this today and he is interested in resuming eliquis.  In that setting, I will d/c ASA and he shall remain on eliquis/plavix.   We will discontinue Imdur in the setting of resolved chest discomfort/PCI.  Cont zetia (statin intolerant).  2. Essential hypertension: Blood pressure elevated in office today x 2.  Continue amlodipine and losartan.  Unable to start beta blocker with baseline bradycardia and history of sinus pauses. We will start spironolactone 12.5 mg daily.  Baseline potassium within normal range. Follow-up basic metabolic panel in one week.    3. Paroxsymal atrial fibrillation: Maintaining sinus rhythm.  On Tikosyn  with prolonged QT @ 475 by manual calculation.  F/u bmet and Mg.  Resuming eliquis as outlined above.  Cont PPI given concomitant eliquis and plavix usage.  4. Hyperlipidemia: LDL 67 in 12/2021. He is statin intolerant and managed with Zetia and fish oil.   5. Sinus node dysfunction: Patient with history of syncope, sinus pauses, junctional escape beats, Mobitz 1, and baseline bradycardia. Denies any recent presyncope or syncope.  He is being followed by EP, who has previously recommended permanent pacemaker but patient deferred.   6. History of CVA: presumed to be in the setting of atrial fibrillation. Patient maintaining sinus rhythm on Tikosyn.   Eliquis will be resumed today.   7.  Preoperative cardiovascular examination:  pt pending colonoscopy.  Unfortunately, w/ recent PCI/DES, he will require at least 6 months of uninterrupted antiplatelet therapy, thus recommend deferral of colonoscopy at this time.  Discussed w/ pt today.  8. Disposition: Follow-up lab work in 1 week. Follow-up in clinic in 1 month or sooner if necessary.   Murray Hodgkins, NP 06/01/2022, 12:25 PM

## 2022-06-01 NOTE — Patient Instructions (Addendum)
Medication Instructions:  STOP ISOSORBIDE AND ASPIRIN RESTART ELIQUIS 5 MG TWICE A DAY  START SPIRONOLACTONE 12.5 MG DAILY    *If you need a refill on your cardiac medications before your next appointment, please call your pharmacy*   Lab Work: BMET [place] in the [place] Entrance at [facility] 1st desk on the right to check in Lab hours: 7:30 am- 5:30 pm (walk in basis)   If you have labs (blood work) drawn today and your tests are completely normal, you will receive your results only by: [place] (if you have MyChart) OR A paper copy in the mail If you have any lab test that is abnormal or we need to change your treatment, we will call you to review the results.   Testing/Procedures:    Follow-Up: At [facility], you and your health needs are our priority.  As part of our continuing mission to provide you with exceptional heart care, we have created designated Provider Care Teams.  These Care Teams include your primary Cardiologist (physician) and Advanced Practice Providers (APPs -  Physician Assistants and Nurse Practitioners) who all work together to provide you with the care you need, when you need it.  We recommend signing up for the patient portal called "MyChart".  Sign up information is provided on this After Visit Summary.  MyChart is used to connect with patients for Virtual Visits (Telemedicine).  Patients are able to view lab/test results, encounter notes, upcoming appointments, etc.  Non-urgent messages can be sent to your provider as well.   To learn more about what you can do with MyChart, go to [email or web address].    Your next appointment:   1 month(s)  The format for your next appointment:   In Person  Provider:   You may see Ida Rogue, MD or one of the following Advanced Practice Providers on your designated Care Team:   Murray Hodgkins, NP  Other Instructions   Important Information About Sugar

## 2022-06-09 ENCOUNTER — Other Ambulatory Visit: Payer: Self-pay

## 2022-06-09 DIAGNOSIS — I2510 Atherosclerotic heart disease of native coronary artery without angina pectoris: Secondary | ICD-10-CM

## 2022-06-09 MED ORDER — CLOPIDOGREL BISULFATE 75 MG PO TABS: 75.0000 mg | ORAL_TABLET | Freq: Every day | ORAL | 1 refills | Status: DC

## 2022-06-10 ENCOUNTER — Other Ambulatory Visit: Payer: Self-pay | Admitting: Cardiovascular Disease

## 2022-06-12 ENCOUNTER — Other Ambulatory Visit: Payer: Self-pay | Admitting: Physician Assistant

## 2022-06-12 DIAGNOSIS — I2510 Atherosclerotic heart disease of native coronary artery without angina pectoris: Secondary | ICD-10-CM

## 2022-06-14 ENCOUNTER — Other Ambulatory Visit: Payer: Self-pay | Admitting: Family Medicine

## 2022-06-20 ENCOUNTER — Encounter: Admission: RE | Payer: Self-pay | Source: Home / Self Care

## 2022-06-20 ENCOUNTER — Ambulatory Visit: Admission: RE | Admit: 2022-06-20 | Payer: Medicare Other | Source: Home / Self Care | Admitting: Gastroenterology

## 2022-06-20 SURGERY — COLONOSCOPY WITH PROPOFOL
Anesthesia: General

## 2022-06-21 ENCOUNTER — Telehealth: Payer: Self-pay | Admitting: Cardiovascular Disease

## 2022-06-21 NOTE — Telephone Encounter (Signed)
Reviewed with patient that he can take both at the same time. He verbalized understanding with no further questions at this time.

## 2022-06-21 NOTE — Telephone Encounter (Signed)
Pt c/o medication issue:  1. Name of Medication: apixaban (ELIQUIS) 5 MG TABS tablet  clopidogrel (PLAVIX) 75 MG tablet   2. How are you currently taking this medication (dosage and times per day)?   Take 1 tablet (5 mg total) by mouth 2 (two) times daily.    TAKE 1 TABLET BY MOUTH DAILY WITH BREAKFAST    3. Are you having a reaction (difficulty breathing--STAT)? no  4. What is your medication issue? Patient wants to know if it's okay for him to take both Eliquis and Plavix at the same time.

## 2022-06-24 ENCOUNTER — Encounter: Payer: Self-pay | Admitting: Family Medicine

## 2022-06-24 ENCOUNTER — Ambulatory Visit: Payer: Medicare Other | Admitting: Family Medicine

## 2022-06-24 ENCOUNTER — Ambulatory Visit (INDEPENDENT_AMBULATORY_CARE_PROVIDER_SITE_OTHER): Payer: Medicare Other | Admitting: Family Medicine

## 2022-06-24 VITALS — BP 118/60 | HR 66 | Temp 98.0°F | Ht 67.0 in | Wt 158.0 lb

## 2022-06-24 DIAGNOSIS — I25810 Atherosclerosis of coronary artery bypass graft(s) without angina pectoris: Secondary | ICD-10-CM | POA: Diagnosis not present

## 2022-06-24 DIAGNOSIS — R059 Cough, unspecified: Secondary | ICD-10-CM

## 2022-06-24 DIAGNOSIS — I10 Essential (primary) hypertension: Secondary | ICD-10-CM

## 2022-06-24 LAB — BASIC METABOLIC PANEL
BUN: 18 mg/dL (ref 6–23)
CO2: 30 mEq/L (ref 19–32)
Calcium: 9.4 mg/dL (ref 8.4–10.5)
Chloride: 103 mEq/L (ref 96–112)
Creatinine, Ser: 0.91 mg/dL (ref 0.40–1.50)
GFR: 78.77 mL/min (ref >60.00)
Glucose, Bld: 86 mg/dL (ref 70–99)
Potassium: 4.6 mEq/L (ref 3.5–5.1)
Sodium: 140 mEq/L (ref 135–145)

## 2022-06-24 LAB — MAGNESIUM: Magnesium: 2.1 mg/dL (ref 1.5–2.5)

## 2022-06-24 MED ORDER — BENZONATATE 100 MG PO CAPS: 100.0000 mg | ORAL_CAPSULE | Freq: Three times a day (TID) | ORAL | 2 refills | Status: DC | PRN

## 2022-06-24 NOTE — Progress Notes (Unsigned)
Cough.  Getting better in the meantime.  Had sx for about 1 week.  Today is the best day so far.  No fevers now, but he was prev 1/2 degree elevated, ie ~99 deg.  No discolored sputum.  Tessalon '100mg'$  helped.  No vomiting.  No facial pain.  No ear pain.  No wheeze.  Can still get a deep breath.    DES per cards.  D/w pt about rechecking labs.   He was pulling a hose out of the way, it caught his foot and he fell.  R upper eyelid bruising. Small abrasion R anterior mandible.  No LOC.  He was able to get up on his own. He wasn't lightheaded.   Meds, vitals, and allergies reviewed.   ROS: Per HPI unless specifically indicated in ROS section

## 2022-06-24 NOTE — Patient Instructions (Signed)
Use tessalon as needed for the cough.  Update me as needed.  Go to the lab on the way out.   If you have mychart we'll likely use that to update you.    Take care.  Glad to see you.

## 2022-06-26 NOTE — Assessment & Plan Note (Signed)
DES per cards.  D/w pt about rechecking labs.  See notes on labs.

## 2022-06-26 NOTE — Assessment & Plan Note (Signed)
Lungs are clear and he is feeling better.  Reasonable to use Tessalon as needed in the meantime then update me as needed.  He agrees with plan.

## 2022-07-06 ENCOUNTER — Encounter: Payer: Self-pay | Admitting: Nurse Practitioner

## 2022-07-06 ENCOUNTER — Ambulatory Visit: Payer: Medicare Other | Attending: Nurse Practitioner | Admitting: Nurse Practitioner

## 2022-07-06 VITALS — BP 162/66 | HR 60 | Ht 67.0 in | Wt 155.5 lb

## 2022-07-06 DIAGNOSIS — I25810 Atherosclerosis of coronary artery bypass graft(s) without angina pectoris: Secondary | ICD-10-CM | POA: Diagnosis not present

## 2022-07-06 DIAGNOSIS — I10 Essential (primary) hypertension: Secondary | ICD-10-CM

## 2022-07-06 DIAGNOSIS — E119 Type 2 diabetes mellitus without complications: Secondary | ICD-10-CM

## 2022-07-06 DIAGNOSIS — I495 Sick sinus syndrome: Secondary | ICD-10-CM | POA: Diagnosis not present

## 2022-07-06 DIAGNOSIS — I480 Paroxysmal atrial fibrillation: Secondary | ICD-10-CM

## 2022-07-06 MED ORDER — SPIRONOLACTONE 25 MG PO TABS: 25.0000 mg | ORAL_TABLET | Freq: Every day | ORAL | 3 refills | Status: DC

## 2022-07-06 NOTE — Patient Instructions (Signed)
Medication Instructions:  Your physician has recommended you make the following change in your medication:   INCREASE Spironolactone to 25 mg once daily.   *If you need a refill on your cardiac medications before your next appointment, please call your pharmacy*   Lab Work: BMET in one week. No appointment is needed. Go to the following location: [facility] Entrance at [facility] 1st desk on the right to check in (REGISTRATION)  Lab hours: Monday- Friday (7:30 am- 5:30 pm)  If you have labs (blood work) drawn today and your tests are completely normal, you will receive your results only by: [place] (if you have MyChart) OR A paper copy in the mail If you have any lab test that is abnormal or we need to change your treatment, we will call you to review the results.   Testing/Procedures: None   Follow-Up: At [facility], you and your health needs are our priority.  As part of our continuing mission to provide you with exceptional heart care, we have created designated Provider Care Teams.  These Care Teams include your primary Cardiologist (physician) and Advanced Practice Providers (APPs -  Physician Assistants and Nurse Practitioners) who all work together to provide you with the care you need, when you need it.   Your next appointment:   4 month(s)  The format for your next appointment:   In Person  Provider:   Ida Rogue, MD or Murray Hodgkins, NP       Important Information About Sugar

## 2022-07-06 NOTE — Progress Notes (Signed)
Office Visit    Patient Name: [name] [name] Date of Encounter: 07/06/2022  Primary Care Provider:  Tonia Ghent, MD Primary Cardiologist:  Ida Rogue, MD  Chief Complaint    81 year old male with a history of CAD status post CABG x6 in 2015, type 2 diabetes mellitus, hypertension, hyperlipidemia, PFO, embolic CVA, and paroxysmal atrial fibrillation, who presents for follow-up related to CAD and hypertension.  Past Medical History    Past Medical History:  Diagnosis Date   Anxiety    Back pain    Coronary artery disease 08/21/2013   a. 08/2013 s/p CABG x 6; b. 05/2022 PCI: LAD 156m 60d, D2 50, LCX 80ost, [id number] OM1 100, RCA 100p, VG->OM2->OM3 nl, VG->D2 30ost, 99d @ Diag insertion (2.25x15 Onyx Frontier DES), VG->RCA nl, LIMA->LAD nl. EF 55-65%.   Diabetes mellitus    Diabetic neuropathy (HCC)    Diastolic dysfunction    a. 02/2017 Echo: EF 55-60%, no rwma, Gr1 DD, mildly dil Ao Root/RV. + PFO.   Diverticulosis    Diverticulosis    Embolic stroke ([place]  a. 04/2017 MRI in setting of freq falls: small subacute infarction of bilat centrum semi-ovale consistent w/ embolic event-->Eliquis.   Erectile dysfunction    Family history of anesthesia complication    ' they cant wake my brother very easy"   Fatty liver    Flank pain    GERD (gastroesophageal reflux disease)    History of bronchitis    History of chicken pox    HSV infection    ocular symptoms and oral lesions   Hypercholesterolemia    Hypertension    Kidney stones    Osteoarthritis    PAF (paroxysmal atrial fibrillation) ([place]  a. Had post-op AF 08/2013; b. 09/2015 Holter: PAC's no AF; c. 05/2017 Noted to be in AFib-->Eliquis (CHA2DS2VASc = 7); d. 08/2017 s/p DCCV-->ERAF-->Tikosyn added; e. 08/2020 Zio: 4% Afib burden.   PFO (patent foramen ovale)    a. 02/2017 Echo: + PFO.   Sinus node dysfunction (HCC)    a. 08/2020 Zio: Predominantly sinus bradycardia at 53 (35-211).  20 SVT runs.  1 run of  nonsustained VT x4 beats.  4% A-fib burden.  16 pauses lasting up to 3.6 seconds.  Intermittent junctional rhythm.  Patient declined pacemaker.   Past Surgical History:  Procedure Laterality Date   CARDIAC CATHETERIZATION  08/21/2013   DR MCAAngelena FormCARDIOVERSION N/A 08/24/2017   Procedure: CARDIOVERSION;  Surgeon: GolMinna MerrittsD;  Location: [facility];  Service: Cardiovascular;  Laterality: N/A;   CHOPine LevelA 08/23/2013   Procedure: CORONARY ARTERY BYPASS GRAFTING (CABG);  Surgeon: EdwGrace IsaacD;  [facility];  Laterality: N/A;  CABG times six utilizing the left internal mammary artery and the right greater saphenous vein harvested endoscopically   CORONARY STENT INTERVENTION N/A 05/16/2022   Procedure: CORONARY STENT INTERVENTION;  Surgeon: AriWellington HampshireD;  [facility];  Service: Cardiovascular;  Laterality: N/A;   ENDOVEIN HARVEST OF GREATER SAPHENOUS VEIN Right 08/23/2013   Procedure: ENDOVEIN HARVEST OF GREATER SAPHENOUS VEIN;  Surgeon: EdwGrace IsaacD;  [facility] [place];  Laterality: Right;   HERNIA REPAIR     X3   INTRAOPERATIVE TRANSESOPHAGEAL ECHOCARDIOGRAM N/A 08/23/2013   Procedure: INTRAOPERATIVE TRANSESOPHAGEAL ECHOCARDIOGRAM;  Surgeon: EdwGrace IsaacD;  [facility] [place]  Service: Open Heart Surgery;  Laterality: N/A;   LEFT HEART CATH AND CORS/GRAFTS ANGIOGRAPHY N/A 05/16/2022   Procedure: LEFT HEART CATH AND CORS/GRAFTS ANGIOGRAPHY;  Surgeon: Wellington Hampshire, MD;  Location: [place] [facility];  Service: Cardiovascular;  Laterality: N/A;   LEFT HEART CATHETERIZATION WITH CORONARY ANGIOGRAM N/A 08/21/2013   Procedure: LEFT HEART CATHETERIZATION WITH CORONARY ANGIOGRAM;  Surgeon: Burnell Blanks, MD;  Location: [facility] C[facility];  Service: Cardiovascular;  Laterality: N/A;    Allergies  Allergies  Allergen Reactions    Fenofibrate Other (See Comments)    Muscle weakness/pain   Fire Ant     Allergic reaction   Statins Other (See Comments)    MYALGIA, muscle pain    History of Present Illness    81 year old male with the above past medical history including CAD status post CABG x6 in 2015, type 2 diabetes mellitus, hypertension, hyperlipidemia, PFO, embolic CVA, and paroxysmal atrial fibrillation.  As noted, in 2015, he underwent coronary artery bypass grafting x6.  He had postoperative atrial fibrillation that subsequently resolved after a short course of amiodarone.  He did require warfarin for a brief period of time.  In 2017, he presented with palpitations and Holter monitoring at that time showed sinus rhythm with PACs.  In the spring 2018, he was seen by neurology in the setting of multiple falls.  MRI of the brain in September 2018 showed small, subacute infarcts of the bilateral centrum Semi ovale consistent with prior embolic event.  It was suggested at that time that this was a result of paroxysmal atrial fibrillation and he was placed on Eliquis 5 mg twice daily.  In October 2018, he was seen by cardiology and was found to be in atrial fibrillation.  He was referred to A-fib clinic in which it was recommended to initiate Tikosyn, but he deferred.  He subsequently underwent cardioversion in January thousand 19 which was initially successful but he had return of atrial fibrillation with fatigue within 4 days.  He was referred back to A-fib clinic and was subsequently admitted and loaded with Tikosyn.  In January 2022, he suffered from a syncopal episode and was subsequently underwent event monitoring which predominantly showed sinus bradycardia at a rate of 53 with 20 brief runs of SVT and a 4% A-fib burden.  He also had episodic junctional rhythm and 16 pauses lasting up to 3.6 seconds.  He was seen by EP (Dr. [name]) in March 2023 was offered permanent pacemaker but declined.  In the setting of prostate  infection, patient was placed on Mobic over the summer 2023 and in that setting, was holding his Eliquis out of fear for GI bleeding.  In October 2023, he was evaluated with a 2 to 36-monthhistory of progressive exertional chest burning and dyspnea.  He was placed on aspirin and Imdur and underwent diagnostic catheterization May 16, 2022 showing severe underlying three-vessel CAD with patent grafts including a LIMA to the LAD, vein graft to the OM 2/OM 3, vein graft to the RCA, and vein graft to the D2.  The vein graft to the D2 had severe disease at the anastomosis to the D2, which was successfully treated with a drug-eluting stent.  He was placed on dual antiplatelet therapy postprocedure with plan to stop aspirin once he resumes Eliquis.  Protonix was initiated for prevention of GI bleeding.  Mr. RRingerwas last seen in cardiology clinic 1 month ago, at which time he reported resolution of the chest burning and  dyspnea.  His blood pressure was elevated in clinic and he was placed on spironolactone 12.5 mg daily.  November 17 lab work showed stable renal function and potassium.  Aspirin was discontinued he was placed back on Eliquis.  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  He said he worked outside for 6 hours yesterday and since his cath, has really been enjoying life.  He denies chest pain, palpitations, PND, orthopnea, dizziness, syncope, edema, or early satiety.  He notes that overall, his blood pressures are improved, though he rarely checks at home.  He has continued to have lower abdominal/pelvic discomfort with plans to follow-up with GI.  Home Medications    Current Outpatient Medications  Medication Sig Dispense Refill   acyclovir (ZOVIRAX) 800 MG tablet TAKE 1 TABLET TWICE A DAY 180 tablet 3   amLODipine (NORVASC) 10 MG tablet Take 1 tablet (10 mg total) by mouth daily. 90 tablet 3   apixaban (ELIQUIS) 5 MG TABS tablet Take 1 tablet (5  mg total) by mouth 2 (two) times daily. 180 tablet 3   benzonatate (TESSALON) 100 MG capsule Take 1 capsule (100 mg total) by mouth 3 (three) times daily as needed for cough. 30 capsule 2   clopidogrel (PLAVIX) 75 MG tablet TAKE 1 TABLET BY MOUTH DAILY WITH BREAKFAST 30 tablet 0   COD LIVER OIL PO Take 1 capsule by mouth 2 (two) times daily. 10 mcg     dofetilide (TIKOSYN) 500 MCG capsule TAKE ONE CAPSULE BY MOUTH TWICE A DAY 180 capsule 2   ezetimibe (ZETIA) 10 MG tablet TAKE 1 TABLET DAILY 90 tablet 1   gabapentin (NEURONTIN) 600 MG tablet Take 0.5-1 tablets (300-600 mg total) by mouth 3 (three) times daily as needed. 270 tablet 1   loratadine (CLARITIN) 10 MG tablet Take 1 tablet (10 mg total) by mouth daily.     losartan (COZAAR) 100 MG tablet Take 1 tablet (100 mg total) by mouth daily. 90 tablet 2   meloxicam (MOBIC) 15 MG tablet Take 15 mg by mouth daily.     metFORMIN (GLUCOPHAGE) 850 MG tablet Take 1 tablet (850 mg total) by mouth 2 (two) times daily with a meal. Resume 05/19/2022. 180 tablet 3   nitroGLYCERIN (NITROSTAT) 0.4 MG SL tablet Place 1 tablet (0.4 mg total) under the tongue every 5 (five) minutes as needed for chest pain (max 3 doses). 25 tablet 6   Omega-3 Fatty Acids (SALMON OIL PO) Take 1,000 mg by mouth. 1 twice daily     pantoprazole (PROTONIX) 40 MG tablet Take 1 tablet (40 mg total) by mouth daily. 90 tablet 3   tamsulosin (FLOMAX) 0.4 MG CAPS capsule Take 0.4 mg by mouth daily.     timolol (BETIMOL) 0.5 % ophthalmic solution Place 1 drop into both eyes 2 (two) times daily.     valACYclovir (VALTREX) 1000 MG tablet Take 1 tablet (1,000 mg total) by mouth 2 (two) times daily as needed. 60 tablet 1   No current facility-administered medications for this visit.     Review of Systems    Ongoing lower abdominal/pelvic discomfort.  He denies chest pain, palpitations, dyspnea, pnd, orthopnea, n, v, dizziness, syncope, edema, weight gain, or early satiety.  All other systems  reviewed and are otherwise negative except as noted above.    Physical Exam    VS:  BP (!) 140/70 (BP Location: Left Arm, Patient Position: Sitting, Cuff Size: Normal)  Pulse 60   Ht '5\' 7"'$  (1.702 m)   Wt 155 lb 8 oz (70.5 kg)   SpO2 97%   BMI 24.35 kg/m  , BMI Body mass index is 24.35 kg/m.     Vitals:   07/06/22 0824 07/06/22 0853  BP: (!) 140/70 (!) 162/66  Pulse: 60   SpO2: 97%     GEN: Well nourished, well developed, in no acute distress. HEENT: normal. Neck: Supple, no JVD, carotid bruits, or masses. Cardiac: RRR, no murmurs, rubs, or gallops. No clubbing, cyanosis, edema.  Radials 2+/PT 2+ and equal bilaterally.  Respiratory:  Respirations regular and unlabored, clear to auscultation bilaterally. GI: Soft, nontender, nondistended, BS + x 4. MS: no deformity or atrophy. Skin: warm and dry, no rash. Neuro:  Strength and sensation are intact. Psych: Normal affect.  Accessory Clinical Findings    ECG personally reviewed by me today -regular sinus rhythm, 60, first-degree AV block, PAC.  QT stable at 472 ms- no acute changes.  Lab Results  Component Value Date   WBC 7.4 05/17/2022   HGB 14.0 05/17/2022   HCT 41.2 05/17/2022   MCV 93.8 05/17/2022   PLT 147 (L) 05/17/2022   Lab Results  Component Value Date   CREATININE 0.91 06/24/2022   BUN 18 06/24/2022   NA 140 06/24/2022   K 4.6 06/24/2022   CL 103 06/24/2022   CO2 30 06/24/2022   Lab Results  Component Value Date   ALT 13 12/16/2021   AST 18 12/16/2021   ALKPHOS 85 12/16/2021   BILITOT 1.1 12/16/2021   Lab Results  Component Value Date   CHOL 112 12/16/2021   HDL 36.10 (L) 12/16/2021   LDLCALC 62 06/07/2017   LDLDIRECT 67.0 12/16/2021   TRIG 220.0 (H) 12/16/2021   CHOLHDL 3 12/16/2021    Lab Results  Component Value Date   HGBA1C 6.4 12/16/2021    Assessment & Plan    1.  Essential hypertension: Blood pressure recently trending higher at home and elevated at last visit prompting  initiation of low-dose spironolactone therapy.  Follow-up labs were stable November 17.  Pressure still trending 140-160 on office check today.  He notes that overall, that is better than what he has seen at home previously.  I will increase his spironolactone to 25 mg daily with plan for follow-up basic metabolic panel next week.  He remains on amlodipine and losartan.  2.  Coronary artery disease: History of multivessel CAD status post CABG x6 in January 2015.  Recent recurrent angina with diagnostic catheterization October 9 revealing severe disease at the anastomosis of the vein graft to the second diagonal, which was successfully treated with a drug-eluting stent.  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  He is on Plavix, Zetia (statin intolerant), ARB, and calcium channel blocker therapy.  No aspirin in setting of chronic Eliquis.  3.  Paroxysmal atrial fibrillation: Maintaining sinus rhythm.  QT stable at 472 ms.  Normal potassium and magnesium on November 17.  He is anticoagulated with Eliquis.  4.  Hyperlipidemia: LDL 67 in May 2023.  He is statin intolerant remains on Zetia and fish oil.  5.  Sinus node dysfunction.  Patient with a history of syncope, sinus pauses, junctional escape beats, Mobitz 1, and baseline bradycardia.  ECG stable today.  No recent history of presyncope or syncope.  Previously evaluated by electrophysiology with recommendation for permanent pacemaker but patient declined.  6.  History of CVA: Presumed to be in the setting of atrial fibrillation.  Maintaining sinus rhythm on Tikosyn.  Anticoagulated with Eliquis.  7.  Type 2 diabetes mellitus: A1c 6.4 in May.  He is on metformin and ARB therapy.  He is statin intolerant and is on Zetia with LDL at goal.  8.  Disposition: Follow-up basic metabolic panel in 1 week.  Follow-up in clinic in 4 months or sooner if necessary.   Murray Hodgkins,  NP 07/06/2022, 8:53 AM

## 2022-07-13 ENCOUNTER — Other Ambulatory Visit
Admission: RE | Admit: 2022-07-13 | Discharge: 2022-07-13 | Disposition: A | Discharge status: Home / Self Care | Payer: Medicare Other | Source: Ambulatory Visit | Attending: Nurse Practitioner | Admitting: Nurse Practitioner

## 2022-07-13 DIAGNOSIS — I10 Essential (primary) hypertension: Secondary | ICD-10-CM

## 2022-07-13 DIAGNOSIS — I2510 Atherosclerotic heart disease of native coronary artery without angina pectoris: Secondary | ICD-10-CM

## 2022-07-13 DIAGNOSIS — I480 Paroxysmal atrial fibrillation: Secondary | ICD-10-CM | POA: Diagnosis not present

## 2022-07-13 DIAGNOSIS — I25810 Atherosclerosis of coronary artery bypass graft(s) without angina pectoris: Secondary | ICD-10-CM | POA: Diagnosis not present

## 2022-07-13 DIAGNOSIS — Z79899 Other long term (current) drug therapy: Secondary | ICD-10-CM

## 2022-07-13 LAB — BASIC METABOLIC PANEL
Anion gap: 6 (ref 5–15)
BUN: 16 mg/dL (ref 8–23)
CO2: 28 mmol/L (ref 22–32)
Calcium: 9.9 mg/dL (ref 8.9–10.3)
Chloride: 103 mmol/L (ref 98–111)
Creatinine, Ser: 0.81 mg/dL (ref 0.61–1.24)
GFR, Estimated: >60 mL/min (ref >60)
Glucose, Bld: 135 mg/dL — ABNORMAL HIGH (ref 70–99)
Potassium: 4.3 mmol/L (ref 3.5–5.1)
Sodium: 137 mmol/L (ref 135–145)

## 2022-07-13 LAB — MAGNESIUM: Magnesium: 2.1 mg/dL (ref 1.7–2.4)

## 2022-09-12 ENCOUNTER — Other Ambulatory Visit: Payer: Self-pay | Admitting: Cardiovascular Disease

## 2022-09-12 MED ORDER — APIXABAN 5 MG PO TABS: 5.0000 mg | ORAL_TABLET | Freq: Two times a day (BID) | ORAL | 3 refills | Status: DC

## 2022-09-22 ENCOUNTER — Emergency Department (HOSPITAL_BASED_OUTPATIENT_CLINIC_OR_DEPARTMENT_OTHER)
Admission: EM | Admit: 2022-09-22 | Discharge: 2022-09-22 | Disposition: A | Discharge status: Home / Self Care | Payer: Medicare Other | Attending: Emergency Medicine | Admitting: Emergency Medicine

## 2022-09-22 ENCOUNTER — Other Ambulatory Visit (HOSPITAL_BASED_OUTPATIENT_CLINIC_OR_DEPARTMENT_OTHER): Payer: Self-pay

## 2022-09-22 ENCOUNTER — Encounter (HOSPITAL_BASED_OUTPATIENT_CLINIC_OR_DEPARTMENT_OTHER): Payer: Self-pay | Admitting: Emergency Medicine

## 2022-09-22 ENCOUNTER — Emergency Department (HOSPITAL_BASED_OUTPATIENT_CLINIC_OR_DEPARTMENT_OTHER): Payer: Medicare Other | Admitting: Radiology

## 2022-09-22 ENCOUNTER — Other Ambulatory Visit: Payer: Self-pay

## 2022-09-22 ENCOUNTER — Emergency Department (HOSPITAL_BASED_OUTPATIENT_CLINIC_OR_DEPARTMENT_OTHER): Payer: Medicare Other

## 2022-09-22 DIAGNOSIS — I2510 Atherosclerotic heart disease of native coronary artery without angina pectoris: Secondary | ICD-10-CM | POA: Insufficient documentation

## 2022-09-22 DIAGNOSIS — Z7902 Long term (current) use of antithrombotics/antiplatelets: Secondary | ICD-10-CM | POA: Diagnosis not present

## 2022-09-22 DIAGNOSIS — R519 Headache, unspecified: Secondary | ICD-10-CM | POA: Diagnosis not present

## 2022-09-22 DIAGNOSIS — R413 Other amnesia: Secondary | ICD-10-CM | POA: Diagnosis not present

## 2022-09-22 DIAGNOSIS — Z7984 Long term (current) use of oral hypoglycemic drugs: Secondary | ICD-10-CM | POA: Insufficient documentation

## 2022-09-22 DIAGNOSIS — Z043 Encounter for examination and observation following other accident: Secondary | ICD-10-CM | POA: Diagnosis not present

## 2022-09-22 DIAGNOSIS — R4182 Altered mental status, unspecified: Secondary | ICD-10-CM | POA: Insufficient documentation

## 2022-09-22 DIAGNOSIS — R410 Disorientation, unspecified: Secondary | ICD-10-CM | POA: Diagnosis not present

## 2022-09-22 DIAGNOSIS — Z7901 Long term (current) use of anticoagulants: Secondary | ICD-10-CM | POA: Diagnosis not present

## 2022-09-22 DIAGNOSIS — M25552 Pain in left hip: Secondary | ICD-10-CM | POA: Insufficient documentation

## 2022-09-22 DIAGNOSIS — R001 Bradycardia, unspecified: Secondary | ICD-10-CM | POA: Diagnosis not present

## 2022-09-22 DIAGNOSIS — W19XXXA Unspecified fall, initial encounter: Secondary | ICD-10-CM | POA: Insufficient documentation

## 2022-09-22 DIAGNOSIS — I4891 Unspecified atrial fibrillation: Secondary | ICD-10-CM | POA: Insufficient documentation

## 2022-09-22 LAB — COMPREHENSIVE METABOLIC PANEL
ALT: 13 U/L (ref 0–44)
AST: 18 U/L (ref 15–41)
Albumin: 4.7 g/dL (ref 3.5–5.0)
Alkaline Phosphatase: 71 U/L (ref 38–126)
Anion gap: 9 (ref 5–15)
BUN: 18 mg/dL (ref 8–23)
CO2: 25 mmol/L (ref 22–32)
Calcium: 10.5 mg/dL — ABNORMAL HIGH (ref 8.9–10.3)
Chloride: 102 mmol/L (ref 98–111)
Creatinine, Ser: 0.83 mg/dL (ref 0.61–1.24)
GFR, Estimated: >60 mL/min (ref >60)
Glucose, Bld: 161 mg/dL — ABNORMAL HIGH (ref 70–99)
Potassium: 4.5 mmol/L (ref 3.5–5.1)
Sodium: 136 mmol/L (ref 135–145)
Total Bilirubin: 1.2 mg/dL (ref 0.3–1.2)
Total Protein: 7.3 g/dL (ref 6.5–8.1)

## 2022-09-22 LAB — CBG MONITORING, ED: Glucose-Capillary: 151 mg/dL — ABNORMAL HIGH (ref 70–99)

## 2022-09-22 LAB — CBC
HCT: 41.9 % (ref 39.0–52.0)
Hemoglobin: 14.5 g/dL (ref 13.0–17.0)
MCH: 32.2 pg (ref 26.0–34.0)
MCHC: 34.6 g/dL (ref 30.0–36.0)
MCV: 92.9 fL (ref 80.0–100.0)
Platelets: 170 10*3/uL (ref 150–400)
RBC: 4.51 MIL/uL (ref 4.22–5.81)
RDW: 13 % (ref 11.5–15.5)
WBC: 5.9 10*3/uL (ref 4.0–10.5)
nRBC: 0 % (ref 0.0–0.2)

## 2022-09-22 LAB — DIFFERENTIAL
Abs Immature Granulocytes: 0.02 10*3/uL (ref 0.00–0.07)
Basophils Absolute: 0 10*3/uL (ref 0.0–0.1)
Basophils Relative: 1 %
Eosinophils Absolute: 0.1 10*3/uL (ref 0.0–0.5)
Eosinophils Relative: 1 %
Immature Granulocytes: 0 %
Lymphocytes Relative: 23 %
Lymphs Abs: 1.4 10*3/uL (ref 0.7–4.0)
Monocytes Absolute: 0.4 10*3/uL (ref 0.1–1.0)
Monocytes Relative: 7 %
Neutro Abs: 4 10*3/uL (ref 1.7–7.7)
Neutrophils Relative %: 68 %

## 2022-09-22 LAB — PROTIME-INR
INR: 1.1 (ref 0.8–1.2)
Prothrombin Time: 14 seconds (ref 11.4–15.2)

## 2022-09-22 LAB — APTT: aPTT: 29 seconds (ref 24–36)

## 2022-09-22 LAB — ETHANOL: Alcohol, Ethyl (B): <10 mg/dL (ref <10)

## 2022-09-22 MED ORDER — SODIUM CHLORIDE 0.9% FLUSH: 3.0000 mL | Freq: Once | INTRAVENOUS | Status: DC

## 2022-09-22 NOTE — ED Provider Notes (Signed)
[facility] Provider Note   CSN: [id number] Arrival date & time: 09/22/22  1043     History  Chief Complaint  Patient presents with   Fall   Altered Mental Status    CLEDITH SEIN is a 82 y.o. male.  HPI   82 year old male with past medical history of CAD, CVA, A-fib anticoagulated on Eliquis presents emergency department with concern for change in mental status.  Patient was reportedly in his baseline health last night going into this morning.  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  He had an appointment at 10 AM in [place] and when the wife went out to see if he was leaving for that she found him walking around outside the tractor confused.  Patient is complaining of mild left hip pain and mild headache.  He is anticoagulated.  Unsure if there was a fall versus LOC.  The patient was confused about the appointment that his friend was post to take him to.  He denies any focal weakness, numbness.  There is no facial droop or speech change.  Vision is baseline.  Patient states he feels great other than not being able to remember what happened or why he got out of the tractor.  He also does not remember talking to his wife about the [place] appointment.  Home Medications Prior to Admission medications   Medication Sig Start Date End Date Taking? Authorizing Provider  acyclovir (ZOVIRAX) 800 MG tablet TAKE 1 TABLET TWICE A DAY 06/14/22   Tonia Ghent, MD  amLODipine (NORVASC) 10 MG tablet Take 1 tablet (10 mg total) by mouth daily. 05/12/22   Theora Gianotti, NP  apixaban (ELIQUIS) 5 MG TABS tablet Take 1 tablet (5 mg total) by mouth 2 (two) times daily. 09/12/22   Minna Merritts, MD  benzonatate (TESSALON) 100 MG capsule Take 1 capsule (100 mg total) by mouth 3 (three) times daily as needed for cough. 06/24/22   Tonia Ghent, MD  clopidogrel (PLAVIX) 75 MG tablet TAKE 1 TABLET BY  MOUTH DAILY WITH BREAKFAST 06/13/22   Dunn, Ryan M, PA-C  COD LIVER OIL PO Take 1 capsule by mouth 2 (two) times daily. 10 mcg    [provider]  dofetilide (TIKOSYN) 500 MCG capsule TAKE ONE CAPSULE BY MOUTH TWICE A DAY 05/16/22   Minna Merritts, MD  ezetimibe (ZETIA) 10 MG tablet TAKE 1 TABLET DAILY 06/10/22   Minna Merritts, MD  gabapentin (NEURONTIN) 600 MG tablet Take 0.5-1 tablets (300-600 mg total) by mouth 3 (three) times daily as needed. 07/07/20   Tonia Ghent, MD  loratadine (CLARITIN) 10 MG tablet Take 1 tablet (10 mg total) by mouth daily. 10/28/18   Tonia Ghent, MD  losartan (COZAAR) 100 MG tablet Take 1 tablet (100 mg total) by mouth daily. 05/03/22   Evans Lance, MD  meloxicam (MOBIC) 15 MG tablet Take 15 mg by mouth daily. 01/21/22   [provider]  metFORMIN (GLUCOPHAGE) 850 MG tablet Take 1 tablet (850 mg total) by mouth 2 (two) times daily with a meal. Resume 05/19/2022. 05/17/22   Dunn, Areta Haber, PA-C  nitroGLYCERIN (NITROSTAT) 0.4 MG SL tablet Place 1 tablet (0.4 mg total) under the tongue every 5 (five) minutes as needed for chest pain (max 3 doses). 05/12/22   Theora Gianotti, NP  Omega-3 Fatty Acids (  SALMON OIL PO) Take 1,000 mg by mouth. 1 twice daily    [provider]  pantoprazole (PROTONIX) 40 MG tablet Take 1 tablet (40 mg total) by mouth daily. 05/23/22   Dunn, Areta Haber, PA-C  spironolactone (ALDACTONE) 25 MG tablet Take 1 tablet (25 mg total) by mouth daily. 07/06/22 10/04/22  Theora Gianotti, NP  tamsulosin (FLOMAX) 0.4 MG CAPS capsule Take 0.4 mg by mouth daily. 01/21/22   [provider]  timolol (BETIMOL) 0.5 % ophthalmic solution Place 1 drop into both eyes 2 (two) times daily.    [provider]  valACYclovir (VALTREX) 1000 MG tablet Take 1 tablet (1,000 mg total) by mouth 2 (two) times daily as needed. 09/23/20   Tonia Ghent, MD      Allergies    Fenofibrate, Fire ant, and Statins     Review of Systems   Review of Systems  Constitutional:  Negative for fever.  Respiratory:  Negative for shortness of breath.   Cardiovascular:  Negative for chest pain.  Gastrointestinal:  Negative for abdominal pain, diarrhea and vomiting.  Skin:  Negative for rash.  Neurological:  Positive for headaches. Negative for dizziness, facial asymmetry, speech difficulty, weakness, light-headedness and numbness.  Psychiatric/Behavioral:  Positive for confusion.     Physical Exam Updated Vital Signs BP (!) 140/83 (BP Location: Right Arm)   Pulse 62   Temp 98.1 F (36.7 C)   Resp 18   Ht 5' 7"$  (1.702 m)   Wt 72 kg   SpO2 98%   BMI 24.84 kg/m  Physical Exam Vitals and nursing note reviewed.  Constitutional:      General: He is not in acute distress.    Appearance: Normal appearance.  HENT:     Head: Normocephalic and atraumatic.     Mouth/Throat:     Mouth: Mucous membranes are moist.  Eyes:     Extraocular Movements: Extraocular movements intact.     Pupils: Pupils are equal, round, and reactive to light.  Cardiovascular:     Rate and Rhythm: Normal rate.  Pulmonary:     Effort: Pulmonary effort is normal. No respiratory distress.  Abdominal:     Palpations: Abdomen is soft.     Tenderness: There is no abdominal tenderness.  Musculoskeletal:     Cervical back: No rigidity or tenderness.  Skin:    General: Skin is warm.  Neurological:     General: No focal deficit present.     Mental Status: He is alert and oriented to person, place, and time. Mental status is at baseline.     Cranial Nerves: No cranial nerve deficit.     Sensory: No sensory deficit.  Psychiatric:        Mood and Affect: Mood normal.     ED Results / Procedures / Treatments   Labs (all labs ordered are listed, but only abnormal results are displayed) Labs Reviewed  PROTIME-INR  APTT  CBC  DIFFERENTIAL  COMPREHENSIVE METABOLIC PANEL  ETHANOL  CBG MONITORING, ED     EKG None  Radiology No results found.  Procedures Procedures    Medications Ordered in ED Medications  sodium chloride flush (NS) 0.9 % injection 3 mL (has no administration in time range)    ED Course/ Medical Decision Making/ A&P                             Medical Decision Making Amount and/or Complexity  of Data Reviewed Labs: ordered. Radiology: ordered.   82 year old male presents emergency department with what sounds like an isolated episode of amnesia.  He is complaining of mild hip and head discomfort, possible fall that he did not remember.  He is on anticoagulation.  On my exam he is neuro intact and nonfocal.  Ambulated with a steady gait.  Blood work is reassuring, urinalysis is negative.  CT imaging of the head and neck showed no acute finding.  Patient continues to have amnesia of that particular event around getting in and out of the tractor.  No report of repetitive questions or anything consistent with transient global amnesia.  Spoke with on-call neurologist, Dr. Leonel Ramsay.  We agree that this does not sound concerning in regards to TIA/CVA.  Could possibly be absent/partial seizures.  We recommend outpatient follow-up for EEG.  Patient has been cautioned on seizure restrictions.  On reevaluation patient states that he feels great, again continues to be neuro intact.  Will plan for outpatient neurology follow-up.  Patient at this time appears safe and stable for discharge and close outpatient follow up. Discharge plan and strict return to ED precautions discussed, patient verbalizes understanding and agreement.        Final Clinical Impression(s) / ED Diagnoses Final diagnoses:  None    Rx / DC Orders ED Discharge Orders     None         Lorelle Gibbs, DO 09/22/22 1525

## 2022-09-22 NOTE — Discharge Instructions (Signed)
You have been seen and discharged from the emergency department.  Your heart and brain workup were normal.  I spoke with neurology and we do not feel that this was a TIA/CVA.  However neurology wants to follow-up with you as an outpatient, do further testing including EEG.  Please call their office today to schedule an appointment.  This is at the recommendations of Dr. Leonel Ramsay.   Until you are cleared by neurology you are not to drive, operate heavy machinery or do dangerous activities by herself like swimming/taking baths.   Follow-up with your primary provider for further evaluation and further care. Take home medications as prescribed. If you have any worsening symptoms, or an additional episode like this please immediately return to an emergency department for further evaluation.

## 2022-09-22 NOTE — ED Notes (Signed)
ED Provider at bedside. 

## 2022-09-22 NOTE — ED Triage Notes (Signed)
Pt arrives to ED with c/o fall and altered mental status. Pts family notes pt had an unwitnessed fall this morning. Pt wife notes pt was acutely confused after fall. Pt notes he does not remember falling but does feel confused. He is unsure of LOC or head injury. Wife notes he missed an appointment today at [facility] and pt notes that he feels "funny" and did not remember he had an appointment. Wife notes pt was normal this morning. 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

## 2022-09-22 NOTE — ED Notes (Signed)
Patient transported to CT 

## 2022-09-23 ENCOUNTER — Telehealth: Payer: Self-pay | Admitting: Nurse Practitioner

## 2022-09-23 ENCOUNTER — Ambulatory Visit: Payer: Medicare Other | Admitting: Physician Assistant

## 2022-09-23 VITALS — BP 137/78 | HR 80 | Ht 67.0 in | Wt 155.6 lb

## 2022-09-23 DIAGNOSIS — R404 Transient alteration of awareness: Secondary | ICD-10-CM

## 2022-09-23 DIAGNOSIS — Z8673 Personal history of transient ischemic attack (TIA), and cerebral infarction without residual deficits: Secondary | ICD-10-CM

## 2022-09-23 DIAGNOSIS — R519 Headache, unspecified: Secondary | ICD-10-CM | POA: Diagnosis not present

## 2022-09-23 DIAGNOSIS — Z9229 Personal history of other drug therapy: Secondary | ICD-10-CM | POA: Diagnosis not present

## 2022-09-23 NOTE — Telephone Encounter (Signed)
Spoke with patient and he stated that he did not call or have any questions/concerns as to what medications to hold prior to MRI

## 2022-09-23 NOTE — Progress Notes (Signed)
Assessment/Plan:   [name] [name] is a very pleasant 82 y.o. 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  The etiology of this acute confusional episode is unclear, although we need to rule out neurological versus cardiac etiology.  Wife reports that the patient had an episode of syncope a couple of months ago, and in view of his history of atrial fibrillation this needs to be further evaluated by cards.  Of note, or memory difficulties, the patient declines at this time any workup     Transient Alteration of Awareness   MRI brain without contrast to assess for underlying structural abnormality and assess vascular load  EEG to rule out seizure activity   Patient on Eliquis and Plavix, and taking multiple BP meds, putting him at risk for bleeding and falls . Instructed to Follow with Cards as soon as possible, recommend using only one agentas a blood thinner and go over these BP meds with either cardiologist or their clinical pharmacist  No driving till EEG negative for seizure activity   Folllow up in 1 month   Subjective:   The patient is accompanied by his wife  who supplements the history.  He was in his usual state of health until the morning of 09/22/2022.  Per report, at 8:30 AM, he went out to work on the tractor, having an appointment at 10 AM in [place].  His wife went out to see if he was leaving to the appointment, but she found him walking around outside a tractor and confused.  At the time, he was complaining of mild headache "in the center, on top of the head".  She is not sure if he fell or he lost consciousness.  Per report, the patient was confused about the appointment, and he was not able to  remember of this event or why he got out of the tractor.  He also does not recall talking to his wife about the appointments or anything else.  She did not notice any focal weakness, or facial droop or speech changes or vision changes at the time.  He denies any numbness.  He never had a similar event. No strenuous exercise. No stress.  At the ER, blood work was reassuring, UA was negative.  CT of the head and neck was without acute findings, but chronic small vessel ischemia.  Neurology was consulted by phone (Dr. Leonel Ramsay).  He was not concerned about TIA or CVA.  He could have possibly had absence versus partial seizures.  No other workup was performed at the ER, and he was referred here for other workup.  At the time of discharge he was neurologically intact.  Recent stent and placed on Plavix 2 months ago  Changes in meds?  Plavix  75 mg added to Eliquis 5 mg    R or L handed? R History seizure? NO Witnessed? Tremor:  NO  Voice: NO Hallucinations: NO  visual distortions "Sees double once in a while, if I close either eye, it is ok" lasts 10 seconds. No blackout.  He reports he is scheduled to see his ophthalmologist soon.             Auditory hallucinations? NO  Taste of blood or metallic taste? NO Nausea and Vomiting? NO Lightheadedness/ Dizziness/vertigo?  NO  Syncope? 2 months ago in the parking lot , ? Bradycardic (according to wife)   History of encephalitis or meningitis? NO History of Headaches? Endorsed , as above  Head Trauma/Injuries? NO  Loss of smell:  NO Loss of taste :  NO Urinary or Bowel Incontinence :NO Difficulty Swallowing ?NO  Trouble with ADL's? NO   Trouble buttoning clothing? NO   Mood Changes? Depression NO Memory changes: "Not as much " Sleep:  How many hours?"  I sleep less, about 4-5 h", take 2-3 h in the afternoon              Rested upon waking up? Yes  ETOH? NO  "never" Tobacco? NO "never" Recreational Drugs? "Never"  Caffeine? 1 cup in the  morning, decaffeinated   Memory:   How long did patient have memory difficulties?  "I can go to the store, buy bread, but I cannot remember where I put it ". Patient has some difficulty remembering recent conversations and people names that he just meet, but never did. LTM is good repeats oneself?  Endorsed, but "not that much" Disoriented when walking into a room?  Patient denies other than the recent event.  Leaving objects in unusual places? denies   Wandering behavior?  denies   Any personality changes since last visit?  Patient denies   Any worsening depression?:  Patient denies   Hallucinations or paranoia?  Patient denies   Sleep apnea?  Patient denies, but he snores    Any hygiene concerns?  Patient denies   Independent of bathing and dressing?  Endorsed  Does the patient needs help with medications? Patient  is in charge   Who is in charge of the finances?  Patient  is in charge     Any changes in appetite?   denies     Patient have trouble swallowing?   denies   Does the patient cook?   No  Any kitchen accidents such as leaving the stove on? Patient denies   Unilateral weakness, numbness or tingling?  denies   Any tremors?   denies   Patient [place]    Past Medical History:  Diagnosis Date   Anxiety    Back pain    Coronary artery disease 08/21/2013   a. 08/2013 s/p CABG x 6; b. 05/2022 PCI: LAD 167m 60d, D2 50, LCX 80ost, 931m9090m OM1 100, RCA 100p, VG->OM2->OM3 nl, VG->D2 30ost, 99d @ Diag insertion (2.25x15 Onyx Frontier DES), VG->RCA nl, LIMA->LAD nl. EF 55-65%.   Diabetes mellitus    Diabetic neuropathy (HCC)    Diastolic dysfunction    a. 02/2017 Echo: EF 55-60%, no rwma, Gr1 DD, mildly dil Ao Root/RV. + PFO.   Diverticulosis    Diverticulosis    Embolic stroke ([facility]  a. 04/2017 MRI in setting of freq falls: small subacute infarction of bilat centrum semi-ovale consistent w/ embolic event-->Eliquis.   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 Had post-op AF 08/2013; b. 09/2015 Holter: PAC's no AF; c. 05/2017 Noted to be in AFib-->Eliquis (CHA2DS2VASc = 7); d. 08/2017 s/p DCCV-->ERAF-->Tikosyn added; e. 08/2020 Zio: 4% Afib burden.   PFO (patent foramen ovale)    a. 02/2017 Echo: + PFO.   Sinus node dysfunction (HCC)    a. 08/2020 Zio: Predominantly sinus bradycardia at 53 (35-211).  20 SVT runs.  1 run of nonsustained VT x4 beats.  4% A-fib burden.  16 pauses lasting up to 3.6 seconds.  Intermittent junctional rhythm.  Patient declined pacemaker.     Past Surgical History:  Procedure Laterality Date   CARDIAC CATHETERIZATION  08/21/2013   DR Angelena Form   CARDIOVERSION N/A 08/24/2017   Procedure: CARDIOVERSION;  Surgeon: Minna Merritts, MD;  Location: [facility];  Service: Cardiovascular;  Laterality: N/A;   [place] N/A 08/23/2013   Procedure: CORONARY ARTERY BYPASS GRAFTING (CABG);  Surgeon: Grace Isaac, MD;  Location: [place];  Service: Open Heart Surgery;  Laterality: N/A;  CABG times six utilizing the left internal mammary artery and the right greater saphenous vein harvested endoscopically   CORONARY STENT INTERVENTION N/A 05/16/2022   Procedure: CORONARY STENT INTERVENTION;  Surgeon: Wellington Hampshire, MD;  Location: [place] [facility];  Service: Cardiovascular;  Laterality: N/A;   ENDOVEIN HARVEST OF GREATER SAPHENOUS VEIN Right 08/23/2013   Procedure: ENDOVEIN HARVEST OF GREATER SAPHENOUS VEIN;  Surgeon: Grace Isaac, MD;  Location: [place];  Service: Open Heart Surgery;  Laterality: Right;   HERNIA  REPAIR     X3   INTRAOPERATIVE TRANSESOPHAGEAL ECHOCARDIOGRAM N/A 08/23/2013   Procedure: INTRAOPERATIVE TRANSESOPHAGEAL ECHOCARDIOGRAM;  Surgeon: Grace Isaac, MD;  Location: [place];  Service: Open Heart Surgery;  Laterality: N/A;   LEFT HEART CATH AND CORS/GRAFTS ANGIOGRAPHY N/A 05/16/2022   Procedure: LEFT HEART CATH AND CORS/GRAFTS ANGIOGRAPHY;  Surgeon: Wellington Hampshire, MD;  Location: [place] [facility];  Service: Cardiovascular;  Laterality: N/A;   LEFT HEART CATHETERIZATION WITH CORONARY ANGIOGRAM N/A 08/21/2013   Procedure: LEFT HEART CATHETERIZATION WITH CORONARY ANGIOGRAM;  Surgeon: Burnell Blanks, MD;  Location: [facility];  Service: Cardiovascular;  Laterality: N/A;     Allergies  Allergen Reactions   Fenofibrate Other (See Comments)    Muscle weakness/pain   Fire Ant     Allergic reaction   Statins Other (See Comments)    MYALGIA, muscle pain    Current Outpatient Medications  Medication Instructions   acyclovir (ZOVIRAX) 800 mg, Oral, 2 times daily   amLODipine (NORVASC) 10 mg, Oral, Daily   apixaban (ELIQUIS) 5 mg, Oral, 2 times daily   benzonatate (TESSALON) 100 mg, Oral, 3 times daily PRN   clopidogrel (PLAVIX) 75 mg, Oral, Daily with breakfast   COD LIVER OIL PO 1 capsule, Oral, 2 times daily, 10 mcg   dofetilide (TIKOSYN) 500 mcg, Oral, 2 times daily   ezetimibe (ZETIA) 10 mg, Oral, Daily   gabapentin (NEURONTIN) 300-600 mg, Oral, 3 times daily PRN   loratadine (CLARITIN) 10 mg, Oral, Daily   losartan (COZAAR) 100 mg, Oral, Daily   meloxicam (MOBIC) 15 mg, Oral, Daily   metFORMIN (GLUCOPHAGE) 850 mg, Oral, 2 times daily with meals, Resume 05/19/2022.   nitroGLYCERIN (NITROSTAT) 0.4 mg, Sublingual, Every 5 min PRN   Omega-3 Fatty Acids (SALMON OIL PO) 1,000 mg, Oral, 1 twice daily  pantoprazole (PROTONIX) 40 mg, Oral, Daily   spironolactone (ALDACTONE) 25 mg, Oral, Daily   tamsulosin (FLOMAX) 0.4 mg, Oral, Daily   timolol (BETIMOL) 0.5 %  ophthalmic solution 1 drop, Both Eyes, 2 times daily   valACYclovir (VALTREX) 1,000 mg, Oral, 2 times daily PRN     VITALS:  There were no vitals filed for this visit.    12/14/2021   11:50 AM 07/23/2020   11:24 AM 07/16/2020   10:49 AM 08/16/2017    2:28 PM 07/26/2016    8:49 AM  Depression screen PHQ 2/9  Decreased Interest 0 0 0 0 0  Down, Depressed, Hopeless 0 0 0 0 0  PHQ - 2 Score 0 0 0 0 0  Altered sleeping    0   Tired, decreased energy    0   Change in appetite    0   Feeling bad or failure about yourself     0   Trouble concentrating    0   Moving slowly or fidgety/restless    0   Suicidal thoughts    0   PHQ-9 Score    0   Difficult doing work/chores    Not difficult at all     PHYSICAL EXAM   HEENT:  Normocephalic, atraumatic. The mucous membranes are moist. The superficial temporal arteries are without ropiness or tenderness. Cardiovascular: Regular rate and rhythm. Lungs: Clear to auscultation bilaterally. Neck: There are no carotid bruits noted bilaterally.  NEUROLOGICAL:     No data to display             08/16/2017    2:30 PM 07/26/2016    8:25 AM  MMSE - Mini Mental State Exam  Orientation to time 5 5  Orientation to Place 5 5  Registration 3 3  Attention/ Calculation 0 0  Recall 3 3  Language- name 2 objects 0 0  Language- repeat 1 1  Language- follow 3 step command 3 3  Language- read & follow direction 0 0  Write a sentence 0 0  Copy design 0 0  Total score 20 20     Orientation:  Alert and oriented to person, place and time. No aphasia or dysarthria. Fund of knowledge is appropriate. Recent memory impaired and remote memory intact.  Attention and concentration are normal.  Able to name objects and repeat phrases. Delayed recall    Cranial nerves: There is good facial symmetry. Extraocular muscles are intact and visual fields are full to confrontational testing. Speech is fluent and clear. No tongue deviation. Hearing is intact to  conversational tone. Tone: Tone is good throughout. Sensation: Sensation is intact to light touch and pinprick throughout. Vibration is intact at the bilateral big toe.There is no extinction with double simultaneous stimulation. There is no sensory dermatomal level identified. Coordination: The patient has no difficulty with RAM's or FNF bilaterally. Normal finger to nose  Motor: Strength is 5/5 in the bilateral upper and lower extremities. There is no pronator drift. There are no fasciculations noted. DTR's: Deep tendon reflexes are 2/4 at the bilateral biceps, triceps, brachioradialis, patella and achilles.  Plantar responses are downgoing bilaterally. Gait and Station: The patient is able to ambulate without difficulty.The patient is able to heel toe walk without any difficulty.The patient is able to ambulate in a tandem fashion. The patient is able to stand in the Romberg position.     Thank you for allowing [place] the opportunity to participate in the care of this nice  patient. Please do not hesitate to contact us for any questions or concerns.   Total time spent on today's visit was 64 minutes dedicated to this patient today, preparing to see patient, examining the patient, ordering tests and/or medications and counseling the patient, documenting clinical information in the EHR or other health record, independently interpreting results and communicating results to the patient/family, discussing treatment and goals, answering patient's questions and coordinating care.  Cc:  Tonia Ghent, MD  Sharene Butters 09/23/2022 7:36 AM

## 2022-09-23 NOTE — Patient Instructions (Signed)
MRI brain for transient alteration of awareness EEG to rule out seizures No driving until results of the EEG  Call Cardiology regarding blood thinners today because you are at risk of bleeding, and for blood pressure medication management Follow up in 1 month to discuss the results

## 2022-09-23 NOTE — Telephone Encounter (Signed)
Patient is calling to talk with [name] [name] about all the different medications he is taking for heart. Want to know if there was any that he can hold off on while patient gets MRI done Urologist, Dr. Sharene Butters.

## 2022-09-24 ENCOUNTER — Other Ambulatory Visit: Payer: Self-pay

## 2022-09-24 ENCOUNTER — Emergency Department: Payer: Medicare Other

## 2022-09-24 ENCOUNTER — Encounter: Payer: Self-pay | Admitting: Emergency Medicine

## 2022-09-24 ENCOUNTER — Telehealth: Payer: Self-pay | Admitting: Physician Assistant

## 2022-09-24 ENCOUNTER — Observation Stay
Admission: EM | Admit: 2022-09-24 | Discharge: 2022-09-25 | Disposition: A | Discharge status: Home / Self Care | Payer: Medicare Other | Attending: Hospitalist | Admitting: Hospitalist

## 2022-09-24 DIAGNOSIS — E1149 Type 2 diabetes mellitus with other diabetic neurological complication: Secondary | ICD-10-CM

## 2022-09-24 DIAGNOSIS — Z955 Presence of coronary angioplasty implant and graft: Secondary | ICD-10-CM | POA: Insufficient documentation

## 2022-09-24 DIAGNOSIS — Z7901 Long term (current) use of anticoagulants: Secondary | ICD-10-CM | POA: Diagnosis not present

## 2022-09-24 DIAGNOSIS — I10 Essential (primary) hypertension: Secondary | ICD-10-CM | POA: Diagnosis not present

## 2022-09-24 DIAGNOSIS — E1140 Type 2 diabetes mellitus with diabetic neuropathy, unspecified: Secondary | ICD-10-CM | POA: Insufficient documentation

## 2022-09-24 DIAGNOSIS — H9313 Tinnitus, bilateral: Secondary | ICD-10-CM | POA: Diagnosis not present

## 2022-09-24 DIAGNOSIS — I6503 Occlusion and stenosis of bilateral vertebral arteries: Secondary | ICD-10-CM | POA: Diagnosis not present

## 2022-09-24 DIAGNOSIS — I2510 Atherosclerotic heart disease of native coronary artery without angina pectoris: Secondary | ICD-10-CM | POA: Insufficient documentation

## 2022-09-24 DIAGNOSIS — Z79899 Other long term (current) drug therapy: Secondary | ICD-10-CM | POA: Insufficient documentation

## 2022-09-24 DIAGNOSIS — S066XAA Traumatic subarachnoid hemorrhage with loss of consciousness status unknown, initial encounter: Principal | ICD-10-CM | POA: Insufficient documentation

## 2022-09-24 DIAGNOSIS — S066X0A Traumatic subarachnoid hemorrhage without loss of consciousness, initial encounter: Secondary | ICD-10-CM | POA: Diagnosis not present

## 2022-09-24 DIAGNOSIS — I5030 Unspecified diastolic (congestive) heart failure: Secondary | ICD-10-CM | POA: Diagnosis not present

## 2022-09-24 DIAGNOSIS — Z8673 Personal history of transient ischemic attack (TIA), and cerebral infarction without residual deficits: Secondary | ICD-10-CM | POA: Insufficient documentation

## 2022-09-24 DIAGNOSIS — Z7984 Long term (current) use of oral hypoglycemic drugs: Secondary | ICD-10-CM | POA: Diagnosis not present

## 2022-09-24 DIAGNOSIS — Z7902 Long term (current) use of antithrombotics/antiplatelets: Secondary | ICD-10-CM | POA: Diagnosis not present

## 2022-09-24 DIAGNOSIS — X58XXXA Exposure to other specified factors, initial encounter: Secondary | ICD-10-CM | POA: Insufficient documentation

## 2022-09-24 DIAGNOSIS — I609 Nontraumatic subarachnoid hemorrhage, unspecified: Secondary | ICD-10-CM | POA: Diagnosis not present

## 2022-09-24 DIAGNOSIS — R42 Dizziness and giddiness: Secondary | ICD-10-CM | POA: Insufficient documentation

## 2022-09-24 DIAGNOSIS — I25810 Atherosclerosis of coronary artery bypass graft(s) without angina pectoris: Secondary | ICD-10-CM

## 2022-09-24 DIAGNOSIS — R519 Headache, unspecified: Secondary | ICD-10-CM | POA: Insufficient documentation

## 2022-09-24 DIAGNOSIS — H532 Diplopia: Secondary | ICD-10-CM | POA: Insufficient documentation

## 2022-09-24 DIAGNOSIS — I4819 Other persistent atrial fibrillation: Secondary | ICD-10-CM | POA: Diagnosis not present

## 2022-09-24 DIAGNOSIS — Z951 Presence of aortocoronary bypass graft: Secondary | ICD-10-CM | POA: Insufficient documentation

## 2022-09-24 DIAGNOSIS — S065X0A Traumatic subdural hemorrhage without loss of consciousness, initial encounter: Secondary | ICD-10-CM | POA: Diagnosis not present

## 2022-09-24 DIAGNOSIS — S065XAA Traumatic subdural hemorrhage with loss of consciousness status unknown, initial encounter: Secondary | ICD-10-CM | POA: Insufficient documentation

## 2022-09-24 DIAGNOSIS — I6601 Occlusion and stenosis of right middle cerebral artery: Secondary | ICD-10-CM | POA: Diagnosis not present

## 2022-09-24 LAB — CBC
HCT: 44.9 % (ref 39.0–52.0)
Hemoglobin: 14.9 g/dL (ref 13.0–17.0)
MCH: 31.6 pg (ref 26.0–34.0)
MCHC: 33.2 g/dL (ref 30.0–36.0)
MCV: 95.1 fL (ref 80.0–100.0)
Platelets: 183 10*3/uL (ref 150–400)
RBC: 4.72 MIL/uL (ref 4.22–5.81)
RDW: 13 % (ref 11.5–15.5)
WBC: 6.5 10*3/uL (ref 4.0–10.5)
nRBC: 0 % (ref 0.0–0.2)

## 2022-09-24 LAB — TSH: TSH: 2.566 u[IU]/mL (ref 0.350–4.500)

## 2022-09-24 LAB — COMPREHENSIVE METABOLIC PANEL
ALT: 15 U/L (ref 0–44)
AST: 22 U/L (ref 15–41)
Albumin: 4.4 g/dL (ref 3.5–5.0)
Alkaline Phosphatase: 77 U/L (ref 38–126)
Anion gap: 8 (ref 5–15)
BUN: 21 mg/dL (ref 8–23)
CO2: 25 mmol/L (ref 22–32)
Calcium: 9.7 mg/dL (ref 8.9–10.3)
Chloride: 103 mmol/L (ref 98–111)
Creatinine, Ser: 0.82 mg/dL (ref 0.61–1.24)
GFR, Estimated: >60 mL/min (ref >60)
Glucose, Bld: 133 mg/dL — ABNORMAL HIGH (ref 70–99)
Potassium: 4.3 mmol/L (ref 3.5–5.1)
Sodium: 136 mmol/L (ref 135–145)
Total Bilirubin: 1.3 mg/dL — ABNORMAL HIGH (ref 0.3–1.2)
Total Protein: 7.2 g/dL (ref 6.5–8.1)

## 2022-09-24 LAB — TROPONIN I (HIGH SENSITIVITY): Troponin I (High Sensitivity): 3 ng/L (ref <18)

## 2022-09-24 LAB — C-REACTIVE PROTEIN: CRP: 0.7 mg/dL (ref <1.0)

## 2022-09-24 LAB — GLUCOSE, CAPILLARY: Glucose-Capillary: 134 mg/dL — ABNORMAL HIGH (ref 70–99)

## 2022-09-24 LAB — MAGNESIUM: Magnesium: 2.2 mg/dL (ref 1.7–2.4)

## 2022-09-24 LAB — SEDIMENTATION RATE: Sed Rate: 4 mm/hr (ref 0–20)

## 2022-09-24 MED ORDER — ONDANSETRON HCL 4 MG PO TABS: 4.0000 mg | ORAL_TABLET | Freq: Four times a day (QID) | ORAL | Status: DC | PRN

## 2022-09-24 MED ORDER — HYDROCODONE-ACETAMINOPHEN 5-325 MG PO TABS: 1.0000 | ORAL_TABLET | Freq: Four times a day (QID) | ORAL | Status: DC | PRN

## 2022-09-24 MED ORDER — LOSARTAN POTASSIUM 50 MG PO TABS
100.0000 mg | ORAL_TABLET | Freq: Every day | ORAL | Status: DC
  Administered 2022-09-25: 100 mg via ORAL
  Filled 2022-09-24: qty 2

## 2022-09-24 MED ORDER — INSULIN ASPART 100 UNIT/ML IJ SOLN
0.0000 [IU] | Freq: Three times a day (TID) | INTRAMUSCULAR | Status: DC
  Administered 2022-09-25: 3 [IU] via SUBCUTANEOUS
  Filled 2022-09-24: qty 1

## 2022-09-24 MED ORDER — TAMSULOSIN HCL 0.4 MG PO CAPS
0.4000 mg | ORAL_CAPSULE | Freq: Every day | ORAL | Status: DC
  Administered 2022-09-25: 0.4 mg via ORAL
  Filled 2022-09-24: qty 1

## 2022-09-24 MED ORDER — AMLODIPINE BESYLATE 10 MG PO TABS
10.0000 mg | ORAL_TABLET | Freq: Every day | ORAL | Status: DC
  Administered 2022-09-25: 10 mg via ORAL
  Filled 2022-09-24: qty 1

## 2022-09-24 MED ORDER — ACETAMINOPHEN 650 MG RE SUPP: 650.0000 mg | Freq: Four times a day (QID) | RECTAL | Status: DC | PRN

## 2022-09-24 MED ORDER — TIMOLOL MALEATE 0.5 % OP SOLN
1.0000 [drp] | Freq: Two times a day (BID) | OPHTHALMIC | Status: DC
  Filled 2022-09-24: qty 5

## 2022-09-24 MED ORDER — ACETAMINOPHEN 325 MG PO TABS: 650.0000 mg | ORAL_TABLET | Freq: Four times a day (QID) | ORAL | Status: DC | PRN

## 2022-09-24 MED ORDER — POLYETHYLENE GLYCOL 3350 17 G PO PACK: 17.0000 g | PACK | Freq: Every day | ORAL | Status: DC | PRN

## 2022-09-24 MED ORDER — CLOPIDOGREL BISULFATE 75 MG PO TABS: 75.0000 mg | ORAL_TABLET | Freq: Every day | ORAL | Status: DC

## 2022-09-24 MED ORDER — SPIRONOLACTONE 25 MG PO TABS
25.0000 mg | ORAL_TABLET | Freq: Every day | ORAL | Status: DC
  Administered 2022-09-25: 25 mg via ORAL
  Filled 2022-09-24: qty 1

## 2022-09-24 MED ORDER — SODIUM CHLORIDE 0.9% FLUSH
3.0000 mL | Freq: Two times a day (BID) | INTRAVENOUS | Status: DC
  Administered 2022-09-24 – 2022-09-25 (×2): 3 mL via INTRAVENOUS

## 2022-09-24 MED ORDER — GADOBUTROL 1 MMOL/ML IV SOLN
7.0000 mL | Freq: Once | INTRAVENOUS | Status: AC | PRN
  Administered 2022-09-24: 7 mL via INTRAVENOUS

## 2022-09-24 MED ORDER — EZETIMIBE 10 MG PO TABS
10.0000 mg | ORAL_TABLET | Freq: Every day | ORAL | Status: DC
  Administered 2022-09-25: 10 mg via ORAL
  Filled 2022-09-24: qty 1

## 2022-09-24 MED ORDER — ACYCLOVIR 200 MG PO CAPS
800.0000 mg | ORAL_CAPSULE | Freq: Two times a day (BID) | ORAL | Status: DC
  Administered 2022-09-24 – 2022-09-25 (×2): 800 mg via ORAL
  Filled 2022-09-24 (×2): qty 4

## 2022-09-24 MED ORDER — GABAPENTIN 300 MG PO CAPS: 300.0000 mg | ORAL_CAPSULE | Freq: Three times a day (TID) | ORAL | Status: DC | PRN

## 2022-09-24 MED ORDER — LORATADINE 10 MG PO TABS
10.0000 mg | ORAL_TABLET | Freq: Every day | ORAL | Status: DC
  Administered 2022-09-25: 10 mg via ORAL
  Filled 2022-09-24: qty 1

## 2022-09-24 MED ORDER — DOFETILIDE 250 MCG PO CAPS
500.0000 ug | ORAL_CAPSULE | Freq: Two times a day (BID) | ORAL | Status: DC
  Administered 2022-09-24 – 2022-09-25 (×2): 500 ug via ORAL
  Filled 2022-09-24 (×2): qty 2

## 2022-09-24 MED ORDER — ONDANSETRON HCL 4 MG/2ML IJ SOLN: 4.0000 mg | Freq: Four times a day (QID) | INTRAMUSCULAR | Status: DC | PRN

## 2022-09-24 MED ORDER — SODIUM CHLORIDE 0.9 % IV BOLUS
500.0000 mL | Freq: Once | INTRAVENOUS | Status: AC
  Administered 2022-09-24: 500 mL via INTRAVENOUS

## 2022-09-24 NOTE — Assessment & Plan Note (Signed)
-   Hold home metformin - A1c pending - SSI moderate

## 2022-09-24 NOTE — Assessment & Plan Note (Signed)
Patient has a history of CAD s/p CABG and more recently, DES in October 2023.  He is on Plavix and Eliquis.  - Holding Plavix and Eliquis until discussion with cardiology - Continue home statin

## 2022-09-24 NOTE — Assessment & Plan Note (Signed)
-   Continue home antihypertensives to maintain normotension in the setting of [facility]

## 2022-09-24 NOTE — ED Provider Notes (Signed)
[facility] Provider Note    Event Date/Time   First MD Initiated Contact with Patient 09/24/22 [phone]     (approximate)   History   No chief complaint on file.   HPI  [name] [name] is a 82 y.o. male past medical history significant for CAD with recent PCI on Plavix, atrial fibrillation on Eliquis and Tikosyn, presents to the emergency department for headache and blurry vision with left neck pain.  Patient had an episode 2 days ago where he does not recall driving his tractor or coming back from the house.  Was evaluated at [facility] at that time and had a CT scan done and was evaluated by neurology.  Diagnosed with transient amnestic event.  Have recommended an outpatient EEG for possible seizure workup and MRI.  Patient comes back in today because he is having dizziness, ongoing headache to the top of his head and double vision.  States that he feels like his vision is unequal and that the road signs are next to each other.  Denies any room spinning dizziness but does endorse dizziness.  No tinnitus or change of hearing.  Denies any chest pain or shortness of breath.  Denies any extremity numbness or weakness.       Physical Exam   Triage Vital Signs: ED Triage Vitals  Enc Vitals Group     BP 09/24/22 0858 (!) 175/69     Pulse Rate 09/24/22 0858 (!) 123     Resp 09/24/22 0858 16     Temp 09/24/22 0858 97.8 F (36.6 C)     Temp Source 09/24/22 0858 Oral     SpO2 09/24/22 0857 98 %     Weight 09/24/22 0859 155 lb (70.3 kg)     Height 09/24/22 0859 5' 7"$  (1.702 m)     Head Circumference --      Peak Flow --      Pain Score 09/24/22 0858 6     Pain Loc --      Pain Edu? --      Excl. in [place]? --     Most recent vital signs: Vitals:   09/24/22 1000 09/24/22 1315  BP: (!) 150/57 (!) 131/59  Pulse: 61 (!) 52  Resp:  18  Temp:  97.8 F (36.6 C)  SpO2: 99% 97%    Physical Exam Constitutional:      Appearance: He is well-developed.  HENT:     Head:  Atraumatic.  Eyes:     Conjunctiva/sclera: Conjunctivae normal.     Pupils: Pupils are equal, round, and reactive to light.  Cardiovascular:     Rate and Rhythm: Rhythm irregular.     Heart sounds: No murmur heard. Pulmonary:     Effort: No respiratory distress.  Musculoskeletal:        General: Normal range of motion.     Cervical back: Normal range of motion.  Skin:    General: Skin is warm.     Capillary Refill: Capillary refill takes less than 2 seconds.  Neurological:     Mental Status: He is alert. Mental status is at baseline.     IMPRESSION / MDM / ASSESSMENT AND PLAN / ED COURSE  I reviewed the triage vital signs and the nursing notes.  Differential diagnosis including central vertigo, CVA, cerebral venous insufficiency or dissection, transient amnestic event, intracranial hemorrhage, electrolyte abnormality   EKG  I, Nathaniel Man, the attending physician, personally viewed and interpreted this ECG.  Rate: 73  Rhythm: Atrial fibrillation  Axis: Normal  Intervals: Normal  ST&T Change: Nonspecific No change when compared to prior EKG done yesterday   Atrial fibrillation while on cardiac telemetry.  RADIOLOGY I independently reviewed imaging, my interpretation of imaging: MR brain MRA -subdural hematoma.  Read as subdural hematoma and subarachnoid hematoma felt to be subacute from trauma.  No signs of an aneurysm.  LABS (all labs ordered are listed, but only abnormal results are displayed) Labs interpreted as -  Initial troponin is negative.  Do not feel that serial troponins are necessary at this time, currently chest pain-free.  Initial troponin is negative and have a low suspicion for ACS.  Possible dysrhythmia.  No significant electrolyte abnormalities.  Normal magnesium level.  Normal thyroid studies.  Normal ESR clinical picture is not consistent with a giant cell arteritis  Labs Reviewed  COMPREHENSIVE METABOLIC PANEL - Abnormal; Notable for the  following components:      Result Value   Glucose, Bld 133 (*)    Total Bilirubin 1.3 (*)    All other components within normal limits  CBC  MAGNESIUM  TSH  SEDIMENTATION RATE  C-REACTIVE PROTEIN  HEMOGLOBIN A1C  TROPONIN I (HIGH SENSITIVITY)    TREATMENT  500 cc bolus  MDM    Patient started complaining of worsening headache and change in vision so obtained a CT scan of the head to evaluate for progression of his subdural and subarachnoid hemorrhages.  Repeat CT scan of the head without progression.  Discussed patient's case with neurosurgery Dr. Dava Najjar, recommended admission to the hospitalist for further workup of what is causing his episode of syncope/fall, holding Eliquis and discussing with cardiology whether or not to hold Plavix.   PROCEDURES:  Critical Care performed: yes  .Critical Care  Performed by: Nathaniel Man, MD Authorized by: Nathaniel Man, MD   Critical care provider statement:    Critical care time (minutes):  40   Critical care time was exclusive of:  Separately billable procedures and treating other patients   Critical care was necessary to treat or prevent imminent or life-threatening deterioration of the following conditions:  CNS failure or compromise   Critical care was time spent personally by me on the following activities:  Development of treatment plan with patient or surrogate, discussions with consultants, evaluation of patient's response to treatment, examination of patient, ordering and review of laboratory studies, ordering and review of radiographic studies, ordering and performing treatments and interventions, pulse oximetry, re-evaluation of patient's condition and review of old charts   Patient's presentation is most consistent with acute presentation with potential threat to life or bodily function.   MEDICATIONS ORDERED IN ED: Medications  sodium chloride flush (NS) 0.9 % injection 3 mL (has no administration in time range)   acetaminophen (TYLENOL) tablet 650 mg (has no administration in time range)    Or  acetaminophen (TYLENOL) suppository 650 mg (has no administration in time range)  HYDROcodone-acetaminophen (NORCO/VICODIN) 5-325 MG per tablet 1-2 tablet (has no administration in time range)  polyethylene glycol (MIRALAX / GLYCOLAX) packet 17 g (has no administration in time range)  ondansetron (ZOFRAN) tablet 4 mg (has no administration in time range)    Or  ondansetron (ZOFRAN) injection 4 mg (has no administration in time range)  acyclovir (ZOVIRAX) 200 MG capsule 800 mg (has no administration in time range)  amLODipine (NORVASC) tablet 10 mg (has no administration in time range)  dofetilide (TIKOSYN) capsule 500 mcg (has no administration in  time range)  ezetimibe (ZETIA) tablet 10 mg (has no administration in time range)  gabapentin (NEURONTIN) capsule 300-600 mg (has no administration in time range)  loratadine (CLARITIN) tablet 10 mg (has no administration in time range)  losartan (COZAAR) tablet 100 mg (has no administration in time range)  spironolactone (ALDACTONE) tablet 25 mg (has no administration in time range)  tamsulosin (FLOMAX) capsule 0.4 mg (has no administration in time range)  timolol (TIMOPTIC) 0.5 % ophthalmic solution 1 drop (has no administration in time range)  clopidogrel (PLAVIX) tablet 75 mg (has no administration in time range)  insulin aspart (novoLOG) injection 0-15 Units (has no administration in time range)  sodium chloride 0.9 % bolus 500 mL (0 mLs Intravenous Stopped 09/24/22 1400)  gadobutrol (GADAVIST) 1 MMOL/ML injection 7 mL (7 mLs Intravenous Contrast Given 09/24/22 1236)    FINAL CLINICAL IMPRESSION(S) / ED DIAGNOSES   Final diagnoses:  SDH (subdural hematoma) (HCC)  SAH (subarachnoid hemorrhage) ([place])     Rx / DC Orders   ED Discharge Orders     None        Note:  This document was prepared using Dragon voice recognition software and may include  unintentional dictation errors.   Nathaniel Man, MD 09/24/22 1622

## 2022-09-24 NOTE — Assessment & Plan Note (Addendum)
[name] [name] is presenting with bilateral small subarachnoid hemorrhage after developing tinnitus and diplopia today.  Likely trauma was 2 days ago when he was found walking around his home confused, and daughter stated he had a small goose egg on his head.  Symptoms are stable at this time.  Patient already took Eliquis and Plavix today prior to arrival.  Discussed with [name] [name]. Okay to hold Plavix and Eliquis until cleared from a neurosurgical/neuro standpoint. Recommended close follow up with either APP or [name] [name] on discharge.   - Nurosurgery consulted; appreciate their recommendations - Will discuss timing of repeat imaging with neurosurgery - Repeat CBC in a.m. - Frequent neurochecks - Telemetry monitoring - Maintain normotension - PT/OT

## 2022-09-24 NOTE — H&P (Addendum)
History and Physical    Patient: [name] [name] [id number] DOB: Nov 25, 1940 DOA: 09/24/2022 DOS: the patient was seen and examined on 09/24/2022 PCP: Tonia Ghent, MD  Patient coming from: Home  Chief Complaint: Dizziness  HPI: [name] [name] is a 82 y.o. male with medical history significant of CAD s/p CABG (2015) and DES (05/2022), atrial fibrillation on Eliquis and Tikosyn, type 2 diabetes, HFpEF, CVA, sinus node dysfunction (patient declined pacemaker), who presents to the ED with complaints of dizziness.  [name] states that this morning when he was trying to read the paper, he noticed that he was having ringing in his ears that was bothersome.  The ringing was bilateral.  In addition, he was having difficulty with reading his newspaper due to double vision.  He felt it difficult to concentrate on the paper itself.  In addition, he has noticed that his memory since the incident on 2/15 has been affected.  He gives an example as, he will go to grab something and then forget what he was going to grab.  At this time, he denies any dizziness and feels that his gait is unaffected.  He denies any focal weakness.  He denies any chest pain, shortness of breath or palpitations.  Per chart review, patient was seen in the ED on 09/22/2022 after patient was found wandering around his tractor confused.  Per patient's wife, she was coming out to check on him prior to his appointment and saw that he drove the tractor into a fence and was walking around confused.  Patient had no recollection of the event.  He was complaining of mild hip pain and mild headache.  Neurology was consulted with suspicion for possible seizure.  Patient was discharged home after CT head was negative.  On 09/23/2022, patient followed up with neurology in their clinic.  At that time, transient alteration of awareness was diagnosed and plans for workup with MRI brain and EEG were ordered.  At that time, no symptoms of dizziness or double  vision were reported.  ED course: On arrival to the ED, patient was hypertensive at 175/69 with heart rate of 123.  Heart rate has ranged between 47 and 123.  Patient was saturating at 98% on room air.  Initial workup remarkable for CBC with no abnormalities, negative troponin, electrolytes and renal function within normal limits.  MRI and MRA of the brain obtained that demonstrated trace bilateral subdural collections, likely hematomas, and trace subarachnoid material presumed to be subacute subarachnoid hemorrhage.  Neurosurgery consulted with recommendations for observation.  TRH contacted for admission.  Review of Systems: As mentioned in the history of present illness. All other systems reviewed and are negative.  Past Medical History:  Diagnosis Date   Anxiety    Back pain    Coronary artery disease 08/21/2013   a. 08/2013 s/p CABG x 6; b. 05/2022 PCI: LAD 168m 60d, D2 50, LCX 80ost, 9108m9030m OM1 100, RCA 100p, VG->OM2->OM3 nl, VG->D2 30ost, 99d @ Diag insertion (2.25x15 Onyx Frontier DES), VG->RCA nl, LIMA->LAD nl. EF 55-65%.   Diabetes mellitus    Diabetic neuropathy (HCC)    Diastolic dysfunction    a. 02/2017 Echo: EF 55-60%, no rwma, Gr1 DD, mildly dil Ao Root/RV. + PFO.   Diverticulosis    Diverticulosis    Embolic stroke ([facility]  a. 04/2017 MRI in setting of freq falls: small subacute infarction of bilat centrum semi-ovale consistent w/ embolic event-->Eliquis.   Erectile dysfunction  Family history of anesthesia complication    ' they cant wake my brother very easy"   Fatty liver    Flank pain    GERD (gastroesophageal reflux disease)    History of bronchitis    History of chicken pox    HSV infection    ocular symptoms and oral lesions   Hypercholesterolemia    Hypertension    Kidney stones    Osteoarthritis    PAF (paroxysmal atrial fibrillation) ([place])    a. Had post-op AF 08/2013; b. 09/2015 Holter: PAC's no AF; c. 05/2017 Noted to be in AFib-->Eliquis  (CHA2DS2VASc = 7); d. 08/2017 s/p DCCV-->ERAF-->Tikosyn added; e. 08/2020 Zio: 4% Afib burden.   PFO (patent foramen ovale)    a. 02/2017 Echo: + PFO.   Sinus node dysfunction (HCC)    a. 08/2020 Zio: Predominantly sinus bradycardia at 53 (35-211).  20 SVT runs.  1 run of nonsustained VT x4 beats.  4% A-fib burden.  16 pauses lasting up to 3.6 seconds.  Intermittent junctional rhythm.  Patient declined pacemaker.   Past Surgical History:  Procedure Laterality Date   CARDIAC CATHETERIZATION  08/21/2013   DR Angelena Form   CARDIOVERSION N/A 08/24/2017   Procedure: CARDIOVERSION;  Surgeon: Minna Merritts, MD;  Location: [facility];  Service: Cardiovascular;  Laterality: N/A;   [place] N/A 08/23/2013   Procedure: CORONARY ARTERY BYPASS GRAFTING (CABG);  Surgeon: Grace Isaac, MD;  Location: [place];  Service: Open Heart Surgery;  Laterality: N/A;  CABG times six utilizing the left internal mammary artery and the right greater saphenous vein harvested endoscopically   CORONARY STENT INTERVENTION N/A 05/16/2022   Procedure: CORONARY STENT INTERVENTION;  Surgeon: Wellington Hampshire, MD;  Location: [place] [facility];  Service: Cardiovascular;  Laterality: N/A;   ENDOVEIN HARVEST OF GREATER SAPHENOUS VEIN Right 08/23/2013   Procedure: ENDOVEIN HARVEST OF GREATER SAPHENOUS VEIN;  Surgeon: Grace Isaac, MD;  Location: [place];  Service: Open Heart Surgery;  Laterality: Right;   HERNIA REPAIR     X3   INTRAOPERATIVE TRANSESOPHAGEAL ECHOCARDIOGRAM N/A 08/23/2013   Procedure: INTRAOPERATIVE TRANSESOPHAGEAL ECHOCARDIOGRAM;  Surgeon: Grace Isaac, MD;  Location: [place];  Service: Open Heart Surgery;  Laterality: N/A;   LEFT HEART CATH AND CORS/GRAFTS ANGIOGRAPHY N/A 05/16/2022   Procedure: LEFT HEART CATH AND CORS/GRAFTS ANGIOGRAPHY;  Surgeon: Wellington Hampshire, MD;  Location: [place] [facility];  Service: Cardiovascular;  Laterality: N/A;   LEFT HEART  CATHETERIZATION WITH CORONARY ANGIOGRAM N/A 08/21/2013   Procedure: LEFT HEART CATHETERIZATION WITH CORONARY ANGIOGRAM;  Surgeon: Burnell Blanks, MD;  Location: [facility];  Service: Cardiovascular;  Laterality: N/A;   Social History:  reports that he has never smoked. He has never used smokeless tobacco. He reports that he does not drink alcohol and does not use drugs.  Allergies  Allergen Reactions   Fenofibrate Other (See Comments)    Muscle weakness/pain   Fire Ant     Allergic reaction   Statins Other (See Comments)    MYALGIA, muscle pain    Family History  Problem Relation Age of Onset   Stroke Mother    Diabetes Mother    Heart disease Father        heart failure   Colon cancer Maternal Grandmother    Parkinsonism Maternal Grandfather    Stroke Paternal Grandfather    Prostate cancer Maternal Uncle    CAD Brother  CABG    Prior to Admission medications   Medication Sig Start Date End Date Taking? Authorizing Provider  acyclovir (ZOVIRAX) 800 MG tablet TAKE 1 TABLET TWICE A DAY 06/14/22   Tonia Ghent, MD  amLODipine (NORVASC) 10 MG tablet Take 1 tablet (10 mg total) by mouth daily. 05/12/22   Theora Gianotti, NP  apixaban (ELIQUIS) 5 MG TABS tablet Take 1 tablet (5 mg total) by mouth 2 (two) times daily. 09/12/22   Minna Merritts, MD  benzonatate (TESSALON) 100 MG capsule Take 1 capsule (100 mg total) by mouth 3 (three) times daily as needed for cough. 06/24/22   Tonia Ghent, MD  clopidogrel (PLAVIX) 75 MG tablet TAKE 1 TABLET BY MOUTH DAILY WITH BREAKFAST 06/13/22   Dunn, Ryan M, PA-C  COD LIVER OIL PO Take 1 capsule by mouth 2 (two) times daily. 10 mcg    [provider]  dofetilide (TIKOSYN) 500 MCG capsule TAKE ONE CAPSULE BY MOUTH TWICE A DAY 05/16/22   Minna Merritts, MD  ezetimibe (ZETIA) 10 MG tablet TAKE 1 TABLET DAILY 06/10/22   Minna Merritts, MD  gabapentin (NEURONTIN) 600 MG tablet Take 0.5-1 tablets (300-600 mg  total) by mouth 3 (three) times daily as needed. 07/07/20   Tonia Ghent, MD  loratadine (CLARITIN) 10 MG tablet Take 1 tablet (10 mg total) by mouth daily. 10/28/18   Tonia Ghent, MD  losartan (COZAAR) 100 MG tablet Take 1 tablet (100 mg total) by mouth daily. 05/03/22   Evans Lance, MD  meloxicam (MOBIC) 15 MG tablet Take 15 mg by mouth daily. 01/21/22   [provider]  metFORMIN (GLUCOPHAGE) 850 MG tablet Take 1 tablet (850 mg total) by mouth 2 (two) times daily with a meal. Resume 05/19/2022. 05/17/22   Dunn, Areta Haber, PA-C  nitroGLYCERIN (NITROSTAT) 0.4 MG SL tablet Place 1 tablet (0.4 mg total) under the tongue every 5 (five) minutes as needed for chest pain (max 3 doses). 05/12/22   Theora Gianotti, NP  Omega-3 Fatty Acids (SALMON OIL PO) Take 1,000 mg by mouth. 1 twice daily    [provider]  pantoprazole (PROTONIX) 40 MG tablet Take 1 tablet (40 mg total) by mouth daily. 05/23/22   Dunn, Areta Haber, PA-C  spironolactone (ALDACTONE) 25 MG tablet Take 1 tablet (25 mg total) by mouth daily. 07/06/22 10/04/22  Theora Gianotti, NP  tamsulosin (FLOMAX) 0.4 MG CAPS capsule Take 0.4 mg by mouth daily. 01/21/22   [provider]  timolol (BETIMOL) 0.5 % ophthalmic solution Place 1 drop into both eyes 2 (two) times daily.    [provider]  valACYclovir (VALTREX) 1000 MG tablet Take 1 tablet (1,000 mg total) by mouth 2 (two) times daily as needed. 09/23/20   Tonia Ghent, MD    Physical Exam: Vitals:   09/24/22 0859 09/24/22 1000 09/24/22 1315 09/24/22 1700  BP:  (!) 150/57 (!) 131/59 (!) 143/67  Pulse:  61 (!) 52 (!) 55  Resp:   18 18  Temp:   97.8 F (36.6 C) 97.9 F (36.6 C)  TempSrc:   Oral Oral  SpO2:  99% 97% 95%  Weight: 70.3 kg     Height: 5' 7"$  (1.702 m)      Physical Exam Vitals and nursing note reviewed.  Constitutional:      General: He is not in acute distress.    Appearance: He is normal weight. He is not  toxic-appearing.  HENT:     Head: Normocephalic and atraumatic.     Comments: No scalp lacerations, hematomas or evidence of trauma present    Mouth/Throat:     Mouth: Mucous membranes are moist.     Pharynx: Oropharynx is clear.  Eyes:     General: No scleral icterus.    Extraocular Movements: Extraocular movements intact.     Conjunctiva/sclera: Conjunctivae normal.     Pupils: Pupils are equal, round, and reactive to light.  Cardiovascular:     Rate and Rhythm: Normal rate. Rhythm irregular.     Heart sounds: No murmur heard. Pulmonary:     Effort: Pulmonary effort is normal. No respiratory distress.     Breath sounds: Normal breath sounds. No wheezing or rhonchi.  Musculoskeletal:     Right lower leg: No edema.     Left lower leg: No edema.  Skin:    General: Skin is warm and dry.  Neurological:     Mental Status: He is alert.     Comments:  Patient is alert and oriented x 3.   No dysarthria or facial asymmetry 5 out of 5 strength throughout Gait intact  Psychiatric:        Mood and Affect: Mood normal.        Behavior: Behavior normal.    Data Reviewed: CBC with WBC of 6.5, hemoglobin of 14.9, platelets 183 CMP with sodium of 136, potassium 4.3, bicarb 25, glucose 133, BUN 21, creatinine 0.82, AST 22, ALT 15 and GFR above 60 Troponin negative at 3 ESR negative at 4 TSH within normal limits at 2.5  EKG personally reviewed.  Atrial fibrillation with rate of 73.  No acute ST or T wave changes concerning for ischemia.  CT Head Wo Contrast  Result Date: 09/24/2022 CLINICAL DATA:  Provided history: Headache, increasing frequency or severity, worsening symptoms. EXAM: CT HEAD WITHOUT CONTRAST TECHNIQUE: Contiguous axial images were obtained from the base of the skull through the vertex without intravenous contrast. RADIATION DOSE REDUCTION: This exam was performed according to the departmental dose-optimization program which includes automated exposure control, adjustment  of the mA and/or kV according to patient size and/or use of iterative reconstruction technique. COMPARISON:  Brain MRI performed earlier today 09/24/2022. FINDINGS: Brain: No age advanced or lobar predominant parenchymal atrophy. Trace acute/subacute subarachnoid hemorrhage along the high right frontal lobe, unchanged from the brain MRI performed earlier today. Trace subdural collections overlying the bilateral cerebral hemispheres were better appreciated on the brain MRI performed earlier today. Please refer to this prior examination for further description. No demarcated cortical infarct. No extra-axial fluid collection. No evidence of an intracranial mass. Vascular: No hyperdense vessel.  Atherosclerotic calcifications. Skull: No fracture or aggressive osseous lesion. Sinuses/Orbits: No mass or acute finding within the imaged orbits. Partial opacification of a posterior right ethmoid air cells. Complete opacification of the left sphenoid sinus with associated opacification of the left sphenoethmoidal recess. IMPRESSION: 1. Trace acute/subacute subarachnoid hemorrhage along the high right frontal lobe, unchanged from the brain MRI performed earlier today. 2. Trace subdural collections overlying the bilateral cerebral hemispheres were better appreciated on the brain MRI performed earlier today. Please refer to this prior examination for further description. 3. Paranasal sinus disease at the imaged levels, as described. Electronically Signed   By: Kellie Simmering D.O.   On: 09/24/2022 14:48   MR ANGIO HEAD WO CONTRAST  Result Date: 09/24/2022 CLINICAL DATA:  Headache and dizziness EXAM: MRI HEAD WITHOUT CONTRAST MRA HEAD WITHOUT CONTRAST  MRA OF THE NECK WITHOUT AND WITH CONTRAST TECHNIQUE: Multiplanar, multi-echo pulse sequences of the brain and surrounding structures were acquired without intravenous contrast. Angiographic images of the Circle of Willis were acquired using MRA technique without intravenous  contrast. Angiographic images of the neck were acquired using MRA technique without and with intravenous contrast. Carotid stenosis measurements (when applicable) are obtained utilizing NASCET criteria, using the distal internal carotid diameter as the denominator. CONTRAST:  11m GADAVIST GADOBUTROL 1 MMOL/ML IV SOLN COMPARISON:  04/08/2017 brain MRI FINDINGS: MR HEAD FINDINGS Brain: Trace subdural collection on both sides, given the undulating appearance dural thickening is considered less likely. No acute hemorrhage seen on recent head CT. Trace FLAIR hyperintensity at the superior right central sulcus. No acute infarct, hydrocephalus, or parenchymal mass. Brain volume is normal. Limited remote small vessel ischemic change. Vascular: Arterial findings below. Loss of upper jugular flow voids attributed to extrinsic compression and deceleration of flow. Skull and upper cervical spine: Normal marrow signal. Sinuses/Orbits: Chronic left sphenoid sinusitis with inspissated secretions. Bilateral cataract resection. Retention cyst or polyp in the right maxillary sinus. MRA HEAD FINDINGS Anterior circulation: The right ICA is smaller than the left due to circle-of-Willis variant. Mild atheromatous plaque along the carotid siphons. Advanced right proximal M3 branch stenosis. Mild right M2 origin stenosis. Negative for aneurysm Posterior circulation: Right dominant vertebral artery. Small basilar in the setting of fetal type bilateral PCA flow. No branch occlusion, beading, or aneurysm. MRA NECK FINDINGS Aortic arch: Unremarkable Right carotid system: Vessels are smoothly contoured and widely patent Left carotid system: Vessels are smoothly contoured and widely patent. ICA tortuosity with loop below the skull base. Vertebral arteries: No proximal subclavian stenosis. Dominant right vertebral artery with moderate narrowing at the origin. The left vertebral artery ends in PICA. No irregularity or beading. Other: None.  IMPRESSION: Brain MRI: 1. Trace bilateral subdural collection, likely hematomas which are occult and nonacute by prior head CT. 2. Trace subarachnoid material at the right superior sulcus, presumed subacute subarachnoid hemorrhage given #1. 3. Correlate for subacute trauma.  Consider follow-up with contrast. Intracranial MRA: 1. No emergent finding. 2. Advanced right M3 segment stenosis. Neck MRA: 1. No emergent finding. 2. Moderate narrowing at the right vertebral artery origin. Electronically Signed   By: JJorje GuildM.D.   On: 09/24/2022 12:53   MR Angiogram Neck W or Wo Contrast  Result Date: 09/24/2022 CLINICAL DATA:  Headache and dizziness EXAM: MRI HEAD WITHOUT CONTRAST MRA HEAD WITHOUT CONTRAST MRA OF THE NECK WITHOUT AND WITH CONTRAST TECHNIQUE: Multiplanar, multi-echo pulse sequences of the brain and surrounding structures were acquired without intravenous contrast. Angiographic images of the Circle of Willis were acquired using MRA technique without intravenous contrast. Angiographic images of the neck were acquired using MRA technique without and with intravenous contrast. Carotid stenosis measurements (when applicable) are obtained utilizing NASCET criteria, using the distal internal carotid diameter as the denominator. CONTRAST:  771mGADAVIST GADOBUTROL 1 MMOL/ML IV SOLN COMPARISON:  04/08/2017 brain MRI FINDINGS: MR HEAD FINDINGS Brain: Trace subdural collection on both sides, given the undulating appearance dural thickening is considered less likely. No acute hemorrhage seen on recent head CT. Trace FLAIR hyperintensity at the superior right central sulcus. No acute infarct, hydrocephalus, or parenchymal mass. Brain volume is normal. Limited remote small vessel ischemic change. Vascular: Arterial findings below. Loss of upper jugular flow voids attributed to extrinsic compression and deceleration of flow. Skull and upper cervical spine: Normal marrow signal. Sinuses/Orbits: Chronic left  sphenoid  sinusitis with inspissated secretions. Bilateral cataract resection. Retention cyst or polyp in the right maxillary sinus. MRA HEAD FINDINGS Anterior circulation: The right ICA is smaller than the left due to circle-of-Willis variant. Mild atheromatous plaque along the carotid siphons. Advanced right proximal M3 branch stenosis. Mild right M2 origin stenosis. Negative for aneurysm Posterior circulation: Right dominant vertebral artery. Small basilar in the setting of fetal type bilateral PCA flow. No branch occlusion, beading, or aneurysm. MRA NECK FINDINGS Aortic arch: Unremarkable Right carotid system: Vessels are smoothly contoured and widely patent Left carotid system: Vessels are smoothly contoured and widely patent. ICA tortuosity with loop below the skull base. Vertebral arteries: No proximal subclavian stenosis. Dominant right vertebral artery with moderate narrowing at the origin. The left vertebral artery ends in PICA. No irregularity or beading. Other: None. IMPRESSION: Brain MRI: 1. Trace bilateral subdural collection, likely hematomas which are occult and nonacute by prior head CT. 2. Trace subarachnoid material at the right superior sulcus, presumed subacute subarachnoid hemorrhage given #1. 3. Correlate for subacute trauma.  Consider follow-up with contrast. Intracranial MRA: 1. No emergent finding. 2. Advanced right M3 segment stenosis. Neck MRA: 1. No emergent finding. 2. Moderate narrowing at the right vertebral artery origin. Electronically Signed   By: Jorje Guild M.D.   On: 09/24/2022 12:53   MR BRAIN WO CONTRAST  Result Date: 09/24/2022 CLINICAL DATA:  Headache and dizziness EXAM: MRI HEAD WITHOUT CONTRAST MRA HEAD WITHOUT CONTRAST MRA OF THE NECK WITHOUT AND WITH CONTRAST TECHNIQUE: Multiplanar, multi-echo pulse sequences of the brain and surrounding structures were acquired without intravenous contrast. Angiographic images of the Circle of Willis were acquired using MRA  technique without intravenous contrast. Angiographic images of the neck were acquired using MRA technique without and with intravenous contrast. Carotid stenosis measurements (when applicable) are obtained utilizing NASCET criteria, using the distal internal carotid diameter as the denominator. CONTRAST:  36m GADAVIST GADOBUTROL 1 MMOL/ML IV SOLN COMPARISON:  04/08/2017 brain MRI FINDINGS: MR HEAD FINDINGS Brain: Trace subdural collection on both sides, given the undulating appearance dural thickening is considered less likely. No acute hemorrhage seen on recent head CT. Trace FLAIR hyperintensity at the superior right central sulcus. No acute infarct, hydrocephalus, or parenchymal mass. Brain volume is normal. Limited remote small vessel ischemic change. Vascular: Arterial findings below. Loss of upper jugular flow voids attributed to extrinsic compression and deceleration of flow. Skull and upper cervical spine: Normal marrow signal. Sinuses/Orbits: Chronic left sphenoid sinusitis with inspissated secretions. Bilateral cataract resection. Retention cyst or polyp in the right maxillary sinus. MRA HEAD FINDINGS Anterior circulation: The right ICA is smaller than the left due to circle-of-Willis variant. Mild atheromatous plaque along the carotid siphons. Advanced right proximal M3 branch stenosis. Mild right M2 origin stenosis. Negative for aneurysm Posterior circulation: Right dominant vertebral artery. Small basilar in the setting of fetal type bilateral PCA flow. No branch occlusion, beading, or aneurysm. MRA NECK FINDINGS Aortic arch: Unremarkable Right carotid system: Vessels are smoothly contoured and widely patent Left carotid system: Vessels are smoothly contoured and widely patent. ICA tortuosity with loop below the skull base. Vertebral arteries: No proximal subclavian stenosis. Dominant right vertebral artery with moderate narrowing at the origin. The left vertebral artery ends in PICA. No irregularity or  beading. Other: None. IMPRESSION: Brain MRI: 1. Trace bilateral subdural collection, likely hematomas which are occult and nonacute by prior head CT. 2. Trace subarachnoid material at the right superior sulcus, presumed subacute subarachnoid hemorrhage given #1. 3. Correlate  for subacute trauma.  Consider follow-up with contrast. Intracranial MRA: 1. No emergent finding. 2. Advanced right M3 segment stenosis. Neck MRA: 1. No emergent finding. 2. Moderate narrowing at the right vertebral artery origin. Electronically Signed   By: Jorje Guild M.D.   On: 09/24/2022 12:53    There are no new results to review at this time.  Assessment and Plan:  * Subarachnoid hemorrhage [facility]) Mr. Schwenker is presenting with bilateral small subarachnoid hemorrhage after developing tinnitus and diplopia today.  Likely trauma was 2 days ago when he was found walking around his home confused, and daughter stated he had a small goose egg on his head.  Symptoms are stable at this time.  Patient already took Eliquis and Plavix today prior to arrival.  Discussed with Dr. Garen Lah. Okay to hold Plavix and Eliquis until cleared from a neurosurgical/neuro standpoint. Recommended close follow up with either APP or Dr. Rockey Situ on discharge.   - Nurosurgery consulted; appreciate their recommendations - Will discuss timing of repeat imaging with neurosurgery - Repeat CBC in a.m. - Frequent neurochecks - Telemetry monitoring - Maintain normotension - PT/OT  Subdural hematoma (HCC) Please see above for management of both subdural hematoma and subarachnoid hemorrhage.  CAD (coronary artery disease) of bypass graft Patient has a history of CAD s/p CABG and more recently, DES in October 2023.  He is on Plavix and Eliquis.  - Holding Plavix and Eliquis until discussion with cardiology - Continue home statin  Persistent atrial fibrillation ([place]) - Holding Eliquis due to [facility] and [facility] - Continue home Tikosyn  DM type 2 causing  neurological disease ([place]) - Hold home metformin - A1c pending - SSI moderate  HTN (hypertension) - Continue home antihypertensives to maintain normotension in the setting of [facility]  Advance Care Planning:   Code Status: Full Code Mr. Rutstein states he would want all efforts to be made to resuscitate him in the event of cardiac or pulmonary death.  On the other hand, he states he would not want to be on long-term life support if there is no chance of meaningful recovery.  Consults: Neurology, Neurosurgery  Family Communication: Patient's daughter updated at bedside  Severity of Illness: The appropriate patient status for this patient is OBSERVATION. Observation status is judged to be reasonable and necessary in order to provide the required intensity of service to ensure the patient's safety. The patient's presenting symptoms, physical exam findings, and initial radiographic and laboratory data in the context of their medical condition is felt to place them at decreased risk for further clinical deterioration. Furthermore, it is anticipated that the patient will be medically stable for discharge from the hospital within 2 midnights of admission.   Author: Jose Persia, MD 09/24/2022 7:04 PM  For on call review [email or web address].

## 2022-09-24 NOTE — Assessment & Plan Note (Signed)
-   Holding Eliquis due to [facility] and [facility] - Continue home Tikosyn

## 2022-09-24 NOTE — Telephone Encounter (Signed)
Patient called the answering service this morning with concerns regarding his Eliquis and Plavix in the setting of a possible syncope episode on 09/22/2022, for which he was evaluated in the ED and diagnosed with amnesia. Work up in the ED included a CT head, CT cervical spine, and plain film of the hip, with all imaging showing no acute process with chronic changes noted. He has an MRI of the brain through neurology ordered. He saw neurology yesterday, with their concern being the patient's Eliquis and Plavix. 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   This morning, the patient noted blurry vision, headache and left neck pain. He called asking what to do about his Eliquis and Plavix. I advised I cannot provide recommendations regarding those medications over the phone and that he needs to proceed to the ED for repeat evaluation given ongoing symptoms. The patient needs an MRI of the brain. Will defer ongoing management to the ED and neurology. He was agreeable to this plan and will proceed to the ED.

## 2022-09-24 NOTE — Assessment & Plan Note (Signed)
Please see above for management of both subdural hematoma and subarachnoid hemorrhage.

## 2022-09-24 NOTE — ED Triage Notes (Signed)
Pt to ED via POV. Pt states that he was told by Christell Faith that he needed to come to the ED to have an MRI done. On 2/15 pt had a syncopal episode, pt was told to follow up outpatient with neurology for additional workup and possible EEG. Pt seen neurology yesterday and they were supposed to be setting up the MRI outpatient.  Pt states that he is having a lot of pain in his head, he is having trouble reading, and he is also having dizziness. Pt is currently in NAD.

## 2022-09-25 ENCOUNTER — Observation Stay: Payer: Medicare Other

## 2022-09-25 DIAGNOSIS — S065XAA Traumatic subdural hemorrhage with loss of consciousness status unknown, initial encounter: Secondary | ICD-10-CM | POA: Diagnosis not present

## 2022-09-25 DIAGNOSIS — S066X0A Traumatic subarachnoid hemorrhage without loss of consciousness, initial encounter: Secondary | ICD-10-CM | POA: Diagnosis not present

## 2022-09-25 DIAGNOSIS — I609 Nontraumatic subarachnoid hemorrhage, unspecified: Secondary | ICD-10-CM

## 2022-09-25 DIAGNOSIS — J323 Chronic sphenoidal sinusitis: Secondary | ICD-10-CM | POA: Diagnosis not present

## 2022-09-25 DIAGNOSIS — Z8679 Personal history of other diseases of the circulatory system: Secondary | ICD-10-CM | POA: Diagnosis not present

## 2022-09-25 LAB — HEMOGLOBIN A1C
Hgb A1c MFr Bld: 6.3 % — ABNORMAL HIGH (ref 4.8–5.6)
Mean Plasma Glucose: 134.11 mg/dL

## 2022-09-25 LAB — CBC
HCT: 39.2 % (ref 39.0–52.0)
Hemoglobin: 13.6 g/dL (ref 13.0–17.0)
MCH: 32.2 pg (ref 26.0–34.0)
MCHC: 34.7 g/dL (ref 30.0–36.0)
MCV: 92.9 fL (ref 80.0–100.0)
Platelets: 165 10*3/uL (ref 150–400)
RBC: 4.22 MIL/uL (ref 4.22–5.81)
RDW: 12.9 % (ref 11.5–15.5)
WBC: 6.4 10*3/uL (ref 4.0–10.5)
nRBC: 0 % (ref 0.0–0.2)

## 2022-09-25 LAB — GLUCOSE, CAPILLARY
Glucose-Capillary: 160 mg/dL — ABNORMAL HIGH (ref 70–99)
Glucose-Capillary: 226 mg/dL — ABNORMAL HIGH (ref 70–99)

## 2022-09-25 MED ORDER — CLOPIDOGREL BISULFATE 75 MG PO TABS: ORAL_TABLET | ORAL | 0 refills | Status: DC

## 2022-09-25 MED ORDER — APIXABAN 5 MG PO TABS: ORAL_TABLET | ORAL | 3 refills | Status: DC

## 2022-09-25 NOTE — Discharge Summary (Signed)
Physician Discharge Summary   [name] [name]  male DOB: [birth year]  [id number]  PCP: Tonia Ghent, MD  Admit date: 09/24/2022 Discharge date: 09/25/2022  Admitted From: home Disposition:  home Son updated at bedside prior to discharge. CODE STATUS: Full code  Discharge Instructions     Discharge instructions   Complete by: As directed    Please follow up with neurosurgery Dr. Dava Najjar 1-2 weeks after discharge for repeat head imaging.  Please hold Eliquis and plavix until you see neurosurgery.   Dr. Enzo Bi [facility] Course:  For full details, please see H&P, progress notes, consult notes and ancillary notes.  Briefly,  [name] [name] is a 82 y.o. 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  Pt was seen in the ED on 09/22/2022 after patient was found wandering around his tractor confused.   * Subarachnoid and subdural hemorrhage (HCC) Likely trauma was 2 days ago when he was found walking around his home confused, and daughter stated he had a small goose egg on his head.  Pt was on Eliquis and plavix PTA. --Neurosurgery consulted with Dr. Dava Najjar.  Repeat head CT x2 were stable since presentation.  Pt was cleared for discharge by neurosurgery and will f/u outpatient with neurosurgery in 1-2 weeks. --Hold Eliquis and plavix until neurosurgery f/u.   CAD s/p CABG and more recently, DES in October 2023 He was on Plavix and Eliquis PTA.  --admitting physician discussed with cardio Dr. Garen Lah. Okay to hold Plavix and Eliquis until cleared from a neurosurgical/neuro standpoint.  --Hold Eliquis and plavix until neurosurgery f/u.   Persistent atrial fibrillation (HCC) - Holding Eliquis  due to [facility] and [facility] - Continue home Tikosyn   DM type 2 ([place]) --A1c 6.3, well controlled --resume home metformin after discharge.   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   apixaban 5 MG Tabs tablet Commonly known as: ELIQUIS Hold until outpatient followup with neurosurgery. What changed:  how much to take how to take this when to take this additional instructions   benzonatate 100 MG capsule Commonly known as: TESSALON Take 1 capsule (100 mg total) by mouth 3 (three) times daily as needed for cough.   ciclopirox 8 % solution Commonly known as: PENLAC Apply topically.   clopidogrel 75 MG tablet Commonly known as: PLAVIX Hold until outpatient followup with neurosurgery. What  changed:  how much to take how to take this when to take this additional instructions   COD LIVER OIL PO Take 1 capsule by mouth 2 (two) times daily. 10 mcg   dofetilide 500 MCG capsule Commonly known as: TIKOSYN TAKE ONE CAPSULE BY MOUTH TWICE A DAY   ezetimibe 10 MG tablet Commonly known as: ZETIA TAKE 1 TABLET DAILY   gabapentin 600 MG tablet Commonly known as: NEURONTIN Take 0.5-1 tablets (300-600 mg total) by mouth 3 (three)  times daily as needed.   loratadine 10 MG tablet Commonly known as: CLARITIN Take 1 tablet (10 mg total) by mouth daily.   losartan 100 MG tablet Commonly known as: COZAAR Take 1 tablet (100 mg total) by mouth daily.   meloxicam 15 MG tablet Commonly known as: MOBIC Take 15 mg by mouth daily.   metFORMIN 850 MG tablet Commonly known as: GLUCOPHAGE Take 1 tablet (850 mg total) by mouth 2 (two) times daily with a meal. Resume 05/19/2022.   nitroGLYCERIN 0.4 MG SL tablet Commonly known as: NITROSTAT Place 1 tablet (0.4 mg total) under the tongue every 5 (five) minutes as needed for chest pain (max 3 doses).   pantoprazole 40 MG tablet Commonly known as: Protonix Take 1 tablet (40 mg total) by mouth daily.   SALMON OIL PO Take 1,000 mg by mouth. 1 twice daily   spironolactone 25 MG tablet Commonly known as: ALDACTONE Take 1 tablet (25 mg total) by mouth daily.   tamsulosin 0.4 MG Caps capsule Commonly known as: FLOMAX Take 0.4 mg by mouth daily.   timolol 0.5 % ophthalmic solution Commonly known as: BETIMOL Place 1 drop into both eyes 2 (two) times daily.   valACYclovir 1000 MG tablet Commonly known as: VALTREX Take 1 tablet (1,000 mg total) by mouth 2 (two) times daily as needed.         Follow-up Information     Abd-El-Barr, Rogue Jury, MD Follow up in 1 week(s).   Specialty: Neurosurgery Contact information: [place] [id number] [phone]                 Allergies  Allergen Reactions   Fenofibrate Other (See Comments)    Muscle weakness/pain   Fire Ant     Allergic reaction   Statins Other (See Comments)    MYALGIA, muscle pain     The results of significant diagnostics from this hospitalization (including imaging, microbiology, ancillary and laboratory) are listed below for reference.   Consultations:   Procedures/Studies: CT HEAD WO CONTRAST (5MM)  Result Date: 09/25/2022 CLINICAL DATA:  Follow-up  intracranial hemorrhage EXAM: CT HEAD WITHOUT CONTRAST TECHNIQUE: Contiguous axial images were obtained from the base of the skull through the vertex without intravenous contrast. RADIATION DOSE REDUCTION: This exam was performed according to the departmental dose-optimization program which includes automated exposure control, adjustment of the mA and/or kV according to patient size and/or use of iterative reconstruction technique. COMPARISON:  Yesterday FINDINGS: Brain: Iso to low-density subdural hematomas are non progressed, best seen along the anterior right frontal convexity. Trace subarachnoid hemorrhage at the superior sulcus on the right is unchanged. No progressive finding, infarct, hydrocephalus, or mass. Vascular: No hyperdense vessel or unexpected calcification. Skull: No acute or aggressive finding. Sinuses/Orbits: Chronic left sphenoid sinusitis IMPRESSION: No progression of trace subdural and subarachnoid hemorrhage. No new abnormality. Electronically Signed   By: Jorje Guild M.D.   On: 09/25/2022 08:27   CT Head Wo Contrast  Result Date:  09/24/2022 CLINICAL DATA:  Provided history: Headache, increasing frequency or severity, worsening symptoms. EXAM: CT HEAD WITHOUT CONTRAST TECHNIQUE: Contiguous axial images were obtained from the base of the skull through the vertex without intravenous contrast. RADIATION DOSE REDUCTION: This exam was performed according to the departmental dose-optimization program which includes automated exposure control, adjustment of the mA and/or kV according to patient size and/or use of iterative reconstruction technique. COMPARISON:  Brain MRI performed earlier today 09/24/2022. FINDINGS: Brain: No age advanced or lobar predominant parenchymal atrophy. Trace acute/subacute subarachnoid hemorrhage along the high right frontal lobe, unchanged from the brain MRI performed earlier today. Trace subdural collections overlying the bilateral cerebral hemispheres were  better appreciated on the brain MRI performed earlier today. Please refer to this prior examination for further description. No demarcated cortical infarct. No extra-axial fluid collection. No evidence of an intracranial mass. Vascular: No hyperdense vessel.  Atherosclerotic calcifications. Skull: No fracture or aggressive osseous lesion. Sinuses/Orbits: No mass or acute finding within the imaged orbits. Partial opacification of a posterior right ethmoid air cells. Complete opacification of the left sphenoid sinus with associated opacification of the left sphenoethmoidal recess. IMPRESSION: 1. Trace acute/subacute subarachnoid hemorrhage along the high right frontal lobe, unchanged from the brain MRI performed earlier today. 2. Trace subdural collections overlying the bilateral cerebral hemispheres were better appreciated on the brain MRI performed earlier today. Please refer to this prior examination for further description. 3. Paranasal sinus disease at the imaged levels, as described. Electronically Signed   By: Kellie Simmering D.O.   On: 09/24/2022 14:48   MR ANGIO HEAD WO CONTRAST  Result Date: 09/24/2022 CLINICAL DATA:  Headache and dizziness EXAM: MRI HEAD WITHOUT CONTRAST MRA HEAD WITHOUT CONTRAST MRA OF THE NECK WITHOUT AND WITH CONTRAST TECHNIQUE: Multiplanar, multi-echo pulse sequences of the brain and surrounding structures were acquired without intravenous contrast. Angiographic images of the Circle of Willis were acquired using MRA technique without intravenous contrast. Angiographic images of the neck were acquired using MRA technique without and with intravenous contrast. Carotid stenosis measurements (when applicable) are obtained utilizing NASCET criteria, using the distal internal carotid diameter as the denominator. CONTRAST:  63m GADAVIST GADOBUTROL 1 MMOL/ML IV SOLN COMPARISON:  04/08/2017 brain MRI FINDINGS: MR HEAD FINDINGS Brain: Trace subdural collection on both sides, given the undulating  appearance dural thickening is considered less likely. No acute hemorrhage seen on recent head CT. Trace FLAIR hyperintensity at the superior right central sulcus. No acute infarct, hydrocephalus, or parenchymal mass. Brain volume is normal. Limited remote small vessel ischemic change. Vascular: Arterial findings below. Loss of upper jugular flow voids attributed to extrinsic compression and deceleration of flow. Skull and upper cervical spine: Normal marrow signal. Sinuses/Orbits: Chronic left sphenoid sinusitis with inspissated secretions. Bilateral cataract resection. Retention cyst or polyp in the right maxillary sinus. MRA HEAD FINDINGS Anterior circulation: The right ICA is smaller than the left due to circle-of-Willis variant. Mild atheromatous plaque along the carotid siphons. Advanced right proximal M3 branch stenosis. Mild right M2 origin stenosis. Negative for aneurysm Posterior circulation: Right dominant vertebral artery. Small basilar in the setting of fetal type bilateral PCA flow. No branch occlusion, beading, or aneurysm. MRA NECK FINDINGS Aortic arch: Unremarkable Right carotid system: Vessels are smoothly contoured and widely patent Left carotid system: Vessels are smoothly contoured and widely patent. ICA tortuosity with loop below the skull base. Vertebral arteries: No proximal subclavian stenosis. Dominant right vertebral artery with moderate narrowing at the origin. The left vertebral artery ends in  PICA. No irregularity or beading. Other: None. IMPRESSION: Brain MRI: 1. Trace bilateral subdural collection, likely hematomas which are occult and nonacute by prior head CT. 2. Trace subarachnoid material at the right superior sulcus, presumed subacute subarachnoid hemorrhage given #1. 3. Correlate for subacute trauma.  Consider follow-up with contrast. Intracranial MRA: 1. No emergent finding. 2. Advanced right M3 segment stenosis. Neck MRA: 1. No emergent finding. 2. Moderate narrowing at the  right vertebral artery origin. Electronically Signed   By: Jorje Guild M.D.   On: 09/24/2022 12:53   MR Angiogram Neck W or Wo Contrast  Result Date: 09/24/2022 CLINICAL DATA:  Headache and dizziness EXAM: MRI HEAD WITHOUT CONTRAST MRA HEAD WITHOUT CONTRAST MRA OF THE NECK WITHOUT AND WITH CONTRAST TECHNIQUE: Multiplanar, multi-echo pulse sequences of the brain and surrounding structures were acquired without intravenous contrast. Angiographic images of the Circle of Willis were acquired using MRA technique without intravenous contrast. Angiographic images of the neck were acquired using MRA technique without and with intravenous contrast. Carotid stenosis measurements (when applicable) are obtained utilizing NASCET criteria, using the distal internal carotid diameter as the denominator. CONTRAST:  45m GADAVIST GADOBUTROL 1 MMOL/ML IV SOLN COMPARISON:  04/08/2017 brain MRI FINDINGS: MR HEAD FINDINGS Brain: Trace subdural collection on both sides, given the undulating appearance dural thickening is considered less likely. No acute hemorrhage seen on recent head CT. Trace FLAIR hyperintensity at the superior right central sulcus. No acute infarct, hydrocephalus, or parenchymal mass. Brain volume is normal. Limited remote small vessel ischemic change. Vascular: Arterial findings below. Loss of upper jugular flow voids attributed to extrinsic compression and deceleration of flow. Skull and upper cervical spine: Normal marrow signal. Sinuses/Orbits: Chronic left sphenoid sinusitis with inspissated secretions. Bilateral cataract resection. Retention cyst or polyp in the right maxillary sinus. MRA HEAD FINDINGS Anterior circulation: The right ICA is smaller than the left due to circle-of-Willis variant. Mild atheromatous plaque along the carotid siphons. Advanced right proximal M3 branch stenosis. Mild right M2 origin stenosis. Negative for aneurysm Posterior circulation: Right dominant vertebral artery. Small  basilar in the setting of fetal type bilateral PCA flow. No branch occlusion, beading, or aneurysm. MRA NECK FINDINGS Aortic arch: Unremarkable Right carotid system: Vessels are smoothly contoured and widely patent Left carotid system: Vessels are smoothly contoured and widely patent. ICA tortuosity with loop below the skull base. Vertebral arteries: No proximal subclavian stenosis. Dominant right vertebral artery with moderate narrowing at the origin. The left vertebral artery ends in PICA. No irregularity or beading. Other: None. IMPRESSION: Brain MRI: 1. Trace bilateral subdural collection, likely hematomas which are occult and nonacute by prior head CT. 2. Trace subarachnoid material at the right superior sulcus, presumed subacute subarachnoid hemorrhage given #1. 3. Correlate for subacute trauma.  Consider follow-up with contrast. Intracranial MRA: 1. No emergent finding. 2. Advanced right M3 segment stenosis. Neck MRA: 1. No emergent finding. 2. Moderate narrowing at the right vertebral artery origin. Electronically Signed   By: JJorje GuildM.D.   On: 09/24/2022 12:53   MR BRAIN WO CONTRAST  Result Date: 09/24/2022 CLINICAL DATA:  Headache and dizziness EXAM: MRI HEAD WITHOUT CONTRAST MRA HEAD WITHOUT CONTRAST MRA OF THE NECK WITHOUT AND WITH CONTRAST TECHNIQUE: Multiplanar, multi-echo pulse sequences of the brain and surrounding structures were acquired without intravenous contrast. Angiographic images of the Circle of Willis were acquired using MRA technique without intravenous contrast. Angiographic images of the neck were acquired using MRA technique without and with intravenous contrast. Carotid stenosis  measurements (when applicable) are obtained utilizing NASCET criteria, using the distal internal carotid diameter as the denominator. CONTRAST:  66m GADAVIST GADOBUTROL 1 MMOL/ML IV SOLN COMPARISON:  04/08/2017 brain MRI FINDINGS: MR HEAD FINDINGS Brain: Trace subdural collection on both sides,  given the undulating appearance dural thickening is considered less likely. No acute hemorrhage seen on recent head CT. Trace FLAIR hyperintensity at the superior right central sulcus. No acute infarct, hydrocephalus, or parenchymal mass. Brain volume is normal. Limited remote small vessel ischemic change. Vascular: Arterial findings below. Loss of upper jugular flow voids attributed to extrinsic compression and deceleration of flow. Skull and upper cervical spine: Normal marrow signal. Sinuses/Orbits: Chronic left sphenoid sinusitis with inspissated secretions. Bilateral cataract resection. Retention cyst or polyp in the right maxillary sinus. MRA HEAD FINDINGS Anterior circulation: The right ICA is smaller than the left due to circle-of-Willis variant. Mild atheromatous plaque along the carotid siphons. Advanced right proximal M3 branch stenosis. Mild right M2 origin stenosis. Negative for aneurysm Posterior circulation: Right dominant vertebral artery. Small basilar in the setting of fetal type bilateral PCA flow. No branch occlusion, beading, or aneurysm. MRA NECK FINDINGS Aortic arch: Unremarkable Right carotid system: Vessels are smoothly contoured and widely patent Left carotid system: Vessels are smoothly contoured and widely patent. ICA tortuosity with loop below the skull base. Vertebral arteries: No proximal subclavian stenosis. Dominant right vertebral artery with moderate narrowing at the origin. The left vertebral artery ends in PICA. No irregularity or beading. Other: None. IMPRESSION: Brain MRI: 1. Trace bilateral subdural collection, likely hematomas which are occult and nonacute by prior head CT. 2. Trace subarachnoid material at the right superior sulcus, presumed subacute subarachnoid hemorrhage given #1. 3. Correlate for subacute trauma.  Consider follow-up with contrast. Intracranial MRA: 1. No emergent finding. 2. Advanced right M3 segment stenosis. Neck MRA: 1. No emergent finding. 2.  Moderate narrowing at the right vertebral artery origin. Electronically Signed   By: JJorje GuildM.D.   On: 09/24/2022 12:53   CT Cervical Spine Wo Contrast  Result Date: 09/22/2022 CLINICAL DATA:  Unwitnessed fall EXAM: CT CERVICAL SPINE WITHOUT CONTRAST TECHNIQUE: Multidetector CT imaging of the cervical spine was performed without intravenous contrast. Multiplanar CT image reconstructions were also generated. RADIATION DOSE REDUCTION: This exam was performed according to the departmental dose-optimization program which includes automated exposure control, adjustment of the mA and/or kV according to patient size and/or use of iterative reconstruction technique. COMPARISON:  None Available. FINDINGS: Alignment: Normal. Skull base and vertebrae: No acute fracture. No primary bone lesion or focal pathologic process. Soft tissues and spinal canal: No prevertebral fluid or swelling. No visible canal hematoma. Disc levels: Focally moderate disc space height loss and osteophytosis of C6-C7 with otherwise mild disc space height loss and osteophytosis. Upper chest: Negative. Other: None. IMPRESSION: 1. No fracture or static subluxation of the cervical spine. 2. Focally moderate disc space height loss and osteophytosis of C6-C7 with otherwise mild cervical disc degenerative disease. Electronically Signed   By: ADelanna AhmadiM.D.   On: 09/22/2022 13:09   CT HEAD WO CONTRAST  Result Date: 09/22/2022 CLINICAL DATA:  Mental status change, unknown cause EXAM: CT HEAD WITHOUT CONTRAST TECHNIQUE: Contiguous axial images were obtained from the base of the skull through the vertex without intravenous contrast. RADIATION DOSE REDUCTION: This exam was performed according to the departmental dose-optimization program which includes automated exposure control, adjustment of the mA and/or kV according to patient size and/or use of iterative reconstruction technique. COMPARISON:  12/17/2010 FINDINGS: Brain: There is  periventricular white matter decreased attenuation consistent with small vessel ischemic changes. Gray-white differentiation is preserved. No acute intracranial hemorrhage, mass effect or shift. No hydrocephalus. Vascular: No hyperdense vessel or unexpected calcification. Skull: Normal. Negative for fracture or focal lesion. Sinuses/Orbits: No acute finding. Mucoperiosteal thickening consistent with chronic right maxillary sinusitis. IMPRESSION: Periventricular white matter changes consistent with chronic small vessel ischemia. No acute intracranial process identified. Electronically Signed   By: Sammie Bench M.D.   On: 09/22/2022 13:07   DG Hip Unilat W or Wo Pelvis 2-3 Views Left  Result Date: 09/22/2022 CLINICAL DATA:  Pain EXAM: DG HIP (WITH OR WITHOUT PELVIS) 3V LEFT COMPARISON:  None Available. FINDINGS: No fracture identified. Pelvic ring appears intact. 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 Osseous structures are osteopenic. IMPRESSION: Bilateral hip degenerative changes.  No acute osseous abnormalities. Electronically Signed   By: Sammie Bench M.D.   On: 09/22/2022 12:34      Labs: BNP (last 3 results) No results for input(s): "BNP" in the last 8760 hours. Basic Metabolic Panel: Recent Labs  Lab 09/22/22 1105 09/24/22 0917  NA 136 136  K 4.5 4.3  CL 102 103  CO2 25 25  GLUCOSE 161* 133*  BUN 18 21  CREATININE 0.83 0.82  CALCIUM 10.5* 9.7  MG  --  2.2   Liver Function Tests: Recent Labs  Lab 09/22/22 1105 09/24/22 0917  AST 18 22  ALT 13 15  ALKPHOS 71 77  BILITOT 1.2 1.3*  PROT 7.3 7.2  ALBUMIN 4.7 4.4   No results for input(s): "LIPASE", "AMYLASE" in the last 168 hours. No results for input(s): "AMMONIA" in the last 168 hours. CBC: Recent Labs  Lab 09/22/22 1105 09/24/22 0917 09/25/22 0419  WBC 5.9 6.5 6.4  NEUTROABS 4.0  --   --   HGB 14.5 14.9 13.6  HCT 41.9 44.9 39.2  MCV 92.9 95.1 92.9  PLT 170 183 165    Cardiac Enzymes: No results for input(s): "CKTOTAL", "CKMB", "CKMBINDEX", "TROPONINI" in the last 168 hours. BNP: Invalid input(s): "POCBNP" CBG: Recent Labs  Lab 09/22/22 1146 09/24/22 2035 09/25/22 0851 09/25/22 1117  GLUCAP 151* 134* 160* 226*   D-Dimer No results for input(s): "DDIMER" in the last 72 hours. Hgb A1c Recent Labs    09/25/22 0419  HGBA1C 6.3*   Lipid Profile No results for input(s): "CHOL", "HDL", "LDLCALC", "TRIG", "CHOLHDL", "LDLDIRECT" in the last 72 hours. Thyroid function studies Recent Labs    09/24/22 0917  TSH 2.566   Anemia work up No results for input(s): "VITAMINB12", "FOLATE", "FERRITIN", "TIBC", "IRON", "RETICCTPCT" in the last 72 hours. Urinalysis    Component Value Date/Time   COLORURINE YELLOW 12/16/2021 [place] 12/16/2021 1255   LABSPEC >=1.030 (A) 12/16/2021 1255   PHURINE 5.5 12/16/2021 1255   GLUCOSEU NEGATIVE 12/16/2021 1255   HGBUR NEGATIVE 12/16/2021 1255   BILIRUBINUR NEGATIVE 12/16/2021 1255   BILIRUBINUR negative 04/08/2019 1304   KETONESUR NEGATIVE 12/16/2021 1255   PROTEINUR Positive (A) 04/08/2019 1304   PROTEINUR NEGATIVE 01/19/2017 1931   UROBILINOGEN 0.2 12/16/2021 1255   NITRITE NEGATIVE 12/16/2021 1255   LEUKOCYTESUR NEGATIVE 12/16/2021 1255   Sepsis Labs Recent Labs  Lab 09/22/22 1105 09/24/22 0917 09/25/22 0419  WBC 5.9 6.5 6.4   Microbiology No results found for this or any previous visit (from the past 240 hour(s)).   Total time spend on discharging this patient, including the last  patient exam, discussing the hospital stay, instructions for ongoing care as it relates to all pertinent caregivers, as well as preparing the medical discharge records, prescriptions, and/or referrals as applicable, is 45 minutes.    Enzo Bi, MD  [facility] 09/25/2022, 11:32 AM

## 2022-09-25 NOTE — Consult Note (Signed)
Referring Physician:  No referring provider defined for this encounter.  Primary Physician:  Tonia Ghent, MD  Chief Complaint:  dizziness, problems with vision.  History of Present Illness: [name] [name] is a 82 y.o. male who presents with the chief complaint of dizziness, problems with vision.  He had a questionable fall on Thursday and had a head CT then which was normal.  New MRI yesterday showed small bilateral subdural hematomas with no shift.  Repeat head CT stable.   The symptoms are causing a significant impact on the patient's life.   Review of Systems:  A 10 point review of systems is negative, except for the pertinent positives and negatives detailed in the HPI.  Past Medical History: Past Medical History:  Diagnosis Date   Anxiety    Back pain    Coronary artery disease 08/21/2013   a. 08/2013 s/p CABG x 6; b. 05/2022 PCI: LAD 137m 60d, D2 50, LCX 80ost, 973m9051m OM1 100, RCA 100p, VG->OM2->OM3 nl, VG->D2 30ost, 99d @ Diag insertion (2.25x15 Onyx Frontier DES), VG->RCA nl, LIMA->LAD nl. EF 55-65%.   Diabetes mellitus    Diabetic neuropathy (HCC)    Diastolic dysfunction    a. 02/2017 Echo: EF 55-60%, no rwma, Gr1 DD, mildly dil Ao Root/RV. + PFO.   Diverticulosis    Diverticulosis    Embolic stroke ([facility]  a. 04/2017 MRI in setting of freq falls: small subacute infarction of bilat centrum semi-ovale consistent w/ embolic event-->Eliquis.   Erectile dysfunction    Family history of anesthesia complication    ' they cant wake my brother very easy"   Fatty liver    Flank pain    GERD (gastroesophageal reflux disease)    History of bronchitis    History of chicken pox    HSV infection    ocular symptoms and oral lesions   Hypercholesterolemia    Hypertension    Kidney stones    Osteoarthritis    PAF (paroxysmal atrial fibrillation) ([place]  a. Had post-op AF 08/2013; b. 09/2015 Holter: PAC's no AF; c. 05/2017 Noted to be in AFib-->Eliquis (CHA2DS2VASc =  7); d. 08/2017 s/p DCCV-->ERAF-->Tikosyn added; e. 08/2020 Zio: 4% Afib burden.   PFO (patent foramen ovale)    a. 02/2017 Echo: + PFO.   Sinus node dysfunction (HCC)    a. 08/2020 Zio: Predominantly sinus bradycardia at 53 (35-211).  20 SVT runs.  1 run of nonsustained VT x4 beats.  4% A-fib burden.  16 pauses lasting up to 3.6 seconds.  Intermittent junctional rhythm.  Patient declined pacemaker.    Past Surgical History: Past Surgical History:  Procedure Laterality Date   CARDIAC CATHETERIZATION  08/21/2013   DR MCAAngelena FormCARDIOVERSION N/A 08/24/2017   Procedure: CARDIOVERSION;  Surgeon: GolMinna MerrittsD;  Location: [facility];  Service: Cardiovascular;  Laterality: N/A;   CH[place]ew [place] 08/23/2013   Procedure: CORONARY ARTERY BYPASS GRAFTING (CABG);  Surgeon: EdwGrace IsaacD;  [facility];  Laterality: N/A;  CABG times six utilizing the left internal mammary artery and the right greater saphenous vein harvested endoscopically   CORONARY STENT INTERVENTION N/A 05/16/2022   Procedure: CORONARY STENT INTERVENTION;  Surgeon: AriWellington HampshireD;  [facility];  Service: Cardiovascular;  Laterality: N/A;   ENDOVEIN HARVEST OF GREATER SAPHENOUS VEIN Right 08/23/2013   Procedure: ENDOVEIN HARVEST OF GREATER  SAPHENOUS VEIN;  Surgeon: Grace Isaac, MD;  Location: [place];  Service: Open Heart Surgery;  Laterality: Right;   HERNIA REPAIR     X3   INTRAOPERATIVE TRANSESOPHAGEAL ECHOCARDIOGRAM N/A 08/23/2013   Procedure: INTRAOPERATIVE TRANSESOPHAGEAL ECHOCARDIOGRAM;  Surgeon: Grace Isaac, MD;  Location: [place];  Service: Open Heart Surgery;  Laterality: N/A;   LEFT HEART CATH AND CORS/GRAFTS ANGIOGRAPHY N/A 05/16/2022   Procedure: LEFT HEART CATH AND CORS/GRAFTS ANGIOGRAPHY;  Surgeon: Wellington Hampshire, MD;  Location: [place] [facility];  Service: Cardiovascular;  Laterality: N/A;   LEFT  HEART CATHETERIZATION WITH CORONARY ANGIOGRAM N/A 08/21/2013   Procedure: LEFT HEART CATHETERIZATION WITH CORONARY ANGIOGRAM;  Surgeon: Burnell Blanks, MD;  Location: [facility];  Service: Cardiovascular;  Laterality: N/A;    Problem List: Patient Active Problem List   Diagnosis Date Noted   Subarachnoid hemorrhage ([place]) 09/24/2022   Subdural hematoma ([place]) 09/24/2022   CAD (coronary artery disease) of bypass graft 05/17/2022   Back pain 05/17/2022   History of CVA (cerebrovascular accident) 05/17/2022   Effort angina 05/16/2022   Sinus node dysfunction ([place]) 01/31/2022   Abdominal pain 12/19/2021   Effusion of right olecranon bursa 02/18/2021   Bradycardia 10/19/2020   Sciatica 07/08/2020   Diarrhea 04/19/2020   Hand laceration 12/29/2019   Skin lesion 12/29/2019   Allergy to ant bite 11/27/2019   Vertigo 03/04/2019   Hand abrasion 10/28/2018   Snoring 10/14/2018   Tongue abnormality 10/14/2018   Persistent atrial fibrillation ([place]) 09/18/2017   Hearing loss 08/27/2017   Glaucoma 07/23/2017   Stroke ([place]) 04/11/2017   Myopathy 03/23/2017   Weakness 01/18/2017   Arm pain 08/05/2016   Preop cardiovascular exam 08/04/2016   HTN (hypertension) 08/04/2016   Rectal pain 02/18/2016   Plantar fasciitis 02/18/2016   AK (actinic keratosis) 02/02/2016   Anxiety state 02/19/2015   Exertional chest pain 02/19/2015   Advance care planning 07/15/2014   PAF (paroxysmal atrial fibrillation) ([place]) 08/28/2013   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   Unstable angina (HCC) 08/21/2013   Angina, class III ([place]) 07/19/2013   Lower abdominal pain 04/24/2013   Osteoporosis 06/16/2012   Medicare annual wellness visit, subsequent 06/12/2012   Cough 05/29/2012   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   Neck pain 12/12/2011   Chest pain 11/29/2010   Hyperlipidemia LDL goal <70 11/29/2010   Murmur 11/29/2010    Allergies: Allergies as of 09/24/2022 - Review Complete 09/24/2022   Allergen Reaction Noted   Fenofibrate Other (See Comments) 03/22/2017   Fire ant  11/26/2019   Statins Other (See Comments) 12/08/2011    Medications: @ENCMED$ @  Social History: Social History   Tobacco Use   Smoking status: Never   Smokeless tobacco: Never  Vaping Use   Vaping Use: Never used  Substance Use Topics   Alcohol use: No    Alcohol/week: 0.0 standard drinks of alcohol   Drug use: No    Family Medical History: Family History  Problem Relation Age of Onset   Stroke Mother    Diabetes Mother    Heart disease Father        heart failure   Colon cancer Maternal Grandmother    Parkinsonism Maternal Grandfather    Stroke Paternal Grandfather    Prostate cancer Maternal Uncle    CAD Brother        CABG    Physical Examination: @VITALWITHPAIN$ @  General: Patient is well developed, well nourished, calm, collected, and  in no apparent distress.  Psychiatric: Patient is non-anxious.  Head:  Pupils equal, round, and reactive to light.  ENT:  Oral mucosa appears well hydrated.  Neck:   Supple.  Full range of motion.  Respiratory: Patient is breathing without any difficulty.  Extremities: No edema.  Vascular: Palpable pulses.  Skin:   On exposed skin, there are no abnormal skin lesions.  NEUROLOGICAL:  General: In no acute distress.   Awake, alert, oriented to person, place, and time.  Pupils equal round and reactive to light.  Facial tone is symmetric.  Tongue protrusion is midline.  There is no pronator drift.    Strength: Side Biceps Triceps Deltoid Interossei Grip Wrist Ext. Wrist Flex.  R 5 5 5 5 5 5 5  $ L 5 5 5 5 5 5 5   $ Side Iliopsoas Quads Hamstring PF DF EHL  R 5 5 5 5 5 5  $ L 5 5 5 5 5 5   $ Reflexes are 2+ and symmetric at the biceps, triceps, brachioradialis, patella and achilles.   Bilateral upper and lower extremity sensation is intact to light touch and pin prick.  Clonus is not present.  Toes are down-going.    Hoffman's is  absent.  Imaging: CLINICAL DATA:  Follow-up intracranial hemorrhage   EXAM: CT HEAD WITHOUT CONTRAST   TECHNIQUE: Contiguous axial images were obtained from the base of the skull through the vertex without intravenous contrast.   RADIATION DOSE REDUCTION: This exam was performed according to the departmental dose-optimization program which includes automated exposure control, adjustment of the mA and/or kV according to patient size and/or use of iterative reconstruction technique.   COMPARISON:  Yesterday   FINDINGS: Brain: Iso to low-density subdural hematomas are non progressed, best seen along the anterior right frontal convexity. Trace subarachnoid hemorrhage at the superior sulcus on the right is unchanged. No progressive finding, infarct, hydrocephalus, or mass.   Vascular: No hyperdense vessel or unexpected calcification.   Skull: No acute or aggressive finding.   Sinuses/Orbits: Chronic left sphenoid sinusitis   IMPRESSION: No progression of trace subdural and subarachnoid hemorrhage. No new abnormality.     Electronically Signed   By: Jorje Guild M.D.   On: 09/25/2022 08:27  I have personally reviewed the images and agree with the above interpretation.  Assessment and Plan: Mr. Bartram is a pleasant 82 y.o. male with small subdural hematoma.  I have discussed the condition with the patient, including showing the radiographs and discussing treatment options in layman's terms.  The patient may benefit from conservative management.  Thus, I have recommended the following:   1)  No acute surgical interventions.  2) Would recommend holding plavis and eliquis for now. 3)  Follow up with neurosurgery in 1-2 weeks.    I will see the patient back in a few weeks to gauge progress.  Thank you for involving me in the care of this patient. I will keep you apprised of the patient's progress.   This note was partially dictated using voice recognition software, so  please excuse any errors that were not corrected.  Redge Gainer MD, PhD

## 2022-09-26 ENCOUNTER — Telehealth: Payer: Self-pay

## 2022-09-26 DIAGNOSIS — R404 Transient alteration of awareness: Secondary | ICD-10-CM

## 2022-09-26 DIAGNOSIS — R519 Headache, unspecified: Secondary | ICD-10-CM

## 2022-09-26 DIAGNOSIS — S065XAA Traumatic subdural hemorrhage with loss of consciousness status unknown, initial encounter: Secondary | ICD-10-CM

## 2022-09-26 NOTE — Telephone Encounter (Signed)
-----   Message from Meade Maw, MD sent at 09/26/2022  7:06 AM EST ----- Hey-  Let's repeat his noncon head CT at end of week and will let him restart plavix after that if it's ok.  He can continue asa 81.  Thanks, [employer]

## 2022-09-26 NOTE — Telephone Encounter (Signed)
Noted, I will obtain authorization for this.

## 2022-09-26 NOTE — Telephone Encounter (Signed)
Order placed for repeat head CT. Does he need an appt with [place] after? Or are we just going to call him with the results?

## 2022-09-27 ENCOUNTER — Other Ambulatory Visit: Payer: Medicare Other

## 2022-09-29 NOTE — Telephone Encounter (Signed)
Left message to call back  

## 2022-09-30 NOTE — Telephone Encounter (Signed)
He confirmed CT and telephone visit on 10/11/22.

## 2022-10-03 ENCOUNTER — Telehealth: Payer: Self-pay | Admitting: Cardiovascular Disease

## 2022-10-03 NOTE — Telephone Encounter (Signed)
Called and spoke with the patient. He stated that he was hospitalized on 2/17 with a subdural hemorrhage. Holding Eliquis and Plavix until future testing on 3/1. He was calling to update Ignacia Bayley, NP.  Cardiac cath on 10/9 with stents.

## 2022-10-03 NOTE — Telephone Encounter (Signed)
Patient stated he had a brain bleed and his doctor took him off the blood thinners.  Patient stated he wants a call back directly from Ignacia Bayley, NP.

## 2022-10-07 ENCOUNTER — Ambulatory Visit
Admission: RE | Admit: 2022-10-07 | Discharge: 2022-10-07 | Disposition: A | Discharge status: Home / Self Care | Payer: Medicare Other | Source: Ambulatory Visit | Attending: Neurosurgery | Admitting: Neurosurgery

## 2022-10-07 DIAGNOSIS — S065X0A Traumatic subdural hemorrhage without loss of consciousness, initial encounter: Secondary | ICD-10-CM | POA: Diagnosis not present

## 2022-10-07 DIAGNOSIS — S065XAA Traumatic subdural hemorrhage with loss of consciousness status unknown, initial encounter: Secondary | ICD-10-CM | POA: Insufficient documentation

## 2022-10-11 ENCOUNTER — Ambulatory Visit (INDEPENDENT_AMBULATORY_CARE_PROVIDER_SITE_OTHER): Payer: Medicare Other | Admitting: Neurosurgery

## 2022-10-11 DIAGNOSIS — S065XAA Traumatic subdural hemorrhage with loss of consciousness status unknown, initial encounter: Secondary | ICD-10-CM | POA: Diagnosis not present

## 2022-10-11 NOTE — Progress Notes (Signed)
Referring Physician:  No referring provider defined for this encounter.  Primary Physician:  [name] Ghent, MD  History of Present Illness: 10/11/2022 [name] [name] is here today with a chief complaint of dizziness.  He was seen in the hospital approximately 2 weeks ago where a CT scan showed a small amount of intracranial hemorrhage.  He was stopped off of his therapeutic blood thinners.  Since his visit to the hospital, he has been doing relatively well.  He still has some pressure in his head, but his thinking is back to normal.  He has been compliant with recommendations to take it easy over the past 2 weeks.  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   I have utilized the care everywhere function in epic to review the outside records available from external health systems.  Review of Systems:  A 10 point review of systems is negative, except for the pertinent positives and negatives detailed in the HPI.  Past Medical History: Past Medical History:  Diagnosis Date   Anxiety    Back pain    Coronary artery disease 08/21/2013   a. 08/2013 s/p CABG x 6; b. 05/2022 PCI: LAD 15m 60d, D2 50, LCX 80ost, 990m9041m OM1 100, RCA 100p, VG->OM2->OM3 nl, VG->D2 30ost, 99d @ Diag insertion (2.25x15 Onyx Frontier DES), VG->RCA nl, LIMA->LAD nl. EF 55-65%.   Diabetes mellitus    Diabetic neuropathy (HCC)    Diastolic dysfunction    a. 02/2017 Echo: EF 55-60%, no rwma, Gr1 DD, mildly dil Ao Root/RV. + PFO.   Diverticulosis    Diverticulosis    Embolic stroke ([place]  a. 04/2017 MRI in setting of freq falls: small subacute infarction of bilat centrum semi-ovale consistent w/ embolic event-->Eliquis.   Erectile dysfunction    Family history of anesthesia complication    ' they cant wake my brother very easy"   Fatty liver    Flank pain    GERD (gastroesophageal reflux disease)    History of bronchitis    History of chicken pox     HSV infection    ocular symptoms and oral lesions   Hypercholesterolemia    Hypertension    Kidney stones    Osteoarthritis    PAF (paroxysmal atrial fibrillation) ([place]  a. Had post-op AF 08/2013; b. 09/2015 Holter: PAC's no AF; c. 05/2017 Noted to be in AFib-->Eliquis (CHA2DS2VASc = 7); d. 08/2017 s/p DCCV-->ERAF-->Tikosyn added; e. 08/2020 Zio: 4% Afib burden.   PFO (patent foramen ovale)    a. 02/2017 Echo: + PFO.   Sinus node dysfunction (HCC)    a. 08/2020 Zio: Predominantly sinus bradycardia at 53 (35-211).  20 SVT runs.  1 run of nonsustained VT x4 beats.  4% A-fib burden.  16 pauses lasting up to 3.6 seconds.  Intermittent junctional rhythm.  Patient declined pacemaker.    Past Surgical History: Past Surgical History:  Procedure Laterality Date   CARDIAC CATHETERIZATION  08/21/2013   DR MCAAngelena FormCARDIOVERSION N/A 08/24/2017   Procedure: CARDIOVERSION;  Surgeon: GolMinna MerrittsD;  Location: [facility];  Service: Cardiovascular;  Laterality: N/A;   CHOB[place]A 08/23/2013   Procedure: CORONARY ARTERY BYPASS GRAFTING (CABG);  Surgeon: EdwGrace IsaacD;  [facility];  Laterality: N/A;  CABG times six utilizing the left internal  mammary artery and the right greater saphenous vein harvested endoscopically   CORONARY STENT INTERVENTION N/A 05/16/2022   Procedure: CORONARY STENT INTERVENTION;  Surgeon: Wellington Hampshire, MD;  Location: [place] [facility];  Service: Cardiovascular;  Laterality: N/A;   ENDOVEIN HARVEST OF GREATER SAPHENOUS VEIN Right 08/23/2013   Procedure: ENDOVEIN HARVEST OF GREATER SAPHENOUS VEIN;  Surgeon: Grace Isaac, MD;  Location: [place];  Service: Open Heart Surgery;  Laterality: Right;   HERNIA REPAIR     X3   INTRAOPERATIVE TRANSESOPHAGEAL ECHOCARDIOGRAM N/A 08/23/2013   Procedure: INTRAOPERATIVE TRANSESOPHAGEAL ECHOCARDIOGRAM;  Surgeon: Grace Isaac, MD;  Location:  [facility];  Service: Open Heart Surgery;  Laterality: N/A;   LEFT HEART CATH AND CORS/GRAFTS ANGIOGRAPHY N/A 05/16/2022   Procedure: LEFT HEART CATH AND CORS/GRAFTS ANGIOGRAPHY;  Surgeon: Wellington Hampshire, MD;  Location: [place] [facility];  Service: Cardiovascular;  Laterality: N/A;   LEFT HEART CATHETERIZATION WITH CORONARY ANGIOGRAM N/A 08/21/2013   Procedure: LEFT HEART CATHETERIZATION WITH CORONARY ANGIOGRAM;  Surgeon: Burnell Blanks, MD;  [facility];  Service: Cardiovascular;  Laterality: N/A;    Allergies: Allergies as of 10/11/2022 - Review Complete 09/24/2022  Allergen Reaction Noted   Fenofibrate Other (See Comments) 03/22/2017   Fire ant  11/26/2019   Statins Other (See Comments) 12/08/2011    Medications: No outpatient medications have been marked as taking for the 10/11/22 encounter (Appointment) with Meade Maw, MD.    Social History: Social History   Tobacco Use   Smoking status: Never   Smokeless tobacco: Never  Vaping Use   Vaping Use: Never used  Substance Use Topics   Alcohol use: No    Alcohol/week: 0.0 standard drinks of alcohol   Drug use: No    Family Medical History: Family History  Problem Relation Age of Onset   Stroke Mother    Diabetes Mother    Heart disease Father        heart failure   Colon cancer Maternal Grandmother    Parkinsonism Maternal Grandfather    Stroke Paternal Grandfather    Prostate cancer Maternal Uncle    CAD Brother        CABG    Physical Examination: No exam - telephone visit     Medical Decision Making  Imaging: CT Head 10/07/2022 IMPRESSION: Unchanged subdural and slightly decreased conspicuity of subarachnoid hemorrhage. No new/interval abnormality.     Electronically Signed   By: Margaretha Sheffield M.D.   On: 10/07/2022 10:31  I have personally reviewed the images and agree with the above interpretation.  Assessment and Plan: [name] [name] is a pleasant 82 y.o. male with stable  findings on the CT scan.  He suffered a small amount of intracranial hemorrhage.  At this point, his findings are relatively stable.  He may have some symptoms of postconcussive syndrome.  I recommended that he slowly increase his activity over time.  I cleared him to resume his therapeutic anticoagulation with Plavix.    I will see him back on an as-needed basis.    This visit was performed via telephone.  Patient location: home Provider location: office  I spent a total of 5 minutes non-face-to-face activities for this visit on the date of this encounter including review of current clinical condition and response to treatment.  The patient is aware of and accepts the limits of this telehealth visit.     Thank you for involving me in the care of this patient.  Johnnathan Hagemeister K. Izora Ribas MD, [facility] Neurosurgery

## 2022-10-12 ENCOUNTER — Encounter: Payer: Self-pay | Admitting: Family Medicine

## 2022-10-12 ENCOUNTER — Ambulatory Visit (INDEPENDENT_AMBULATORY_CARE_PROVIDER_SITE_OTHER): Payer: Medicare Other | Admitting: Family Medicine

## 2022-10-12 VITALS — BP 128/88 | HR 57 | Temp 97.4°F | Ht 67.0 in | Wt 159.0 lb

## 2022-10-12 DIAGNOSIS — J323 Chronic sphenoidal sinusitis: Secondary | ICD-10-CM

## 2022-10-12 MED ORDER — PREDNISONE 20 MG PO TABS: ORAL_TABLET | ORAL | 0 refills | Status: DC

## 2022-10-12 NOTE — Progress Notes (Signed)
Patient ID: [name] [name], male    DOB: 09/09/1940, 82 y.o.   MRN: [id number]  This visit was conducted in person.  BP 128/88 (BP Location: Left Arm, Patient Position: Sitting, Cuff Size: Normal)   Pulse (!) 57   Temp (!) 97.4 F (36.3 C) (Temporal)   Ht '5\' 7"'$  (1.702 m)   Wt 159 lb (72.1 kg)   SpO2 96%   BMI 24.90 kg/m    CC:  Chief Complaint  Patient presents with   Nasal Congestion    Runny nose off and on x couple weeks. Patient states it is all clear.    Subjective:   HPI: [name] [name] is a 82 y.o. male pt of Dr. Josefine Class presenting on 10/12/2022 for Nasal Congestion (Runny nose off and on x couple weeks. Patient states it is all clear.)  Recent CT head 2/18/024, reviewed.. follow up intracranial hemorrhage.. noted  incidental chronic left sphenoid sinusitits   Back on blood thinners.  Date of onset: 2-3 weeks Initial symptoms included  clear rhinorrhea, facial pain and pressure over forehead.  Feels balance off.  No vision changes.  Sick contacts:  none COVID testing:   none     He is not on any allergy med or nasal steroid spray.    No history of chronic lung disease such as asthma or COPD. Non-smoker.  Lab Results  Component Value Date   HGBA1C 6.3 (H) 09/25/2022           Relevant past medical, surgical, family and social history reviewed and updated as indicated. Interim medical history since our last visit reviewed. Allergies and medications reviewed and updated. Outpatient Medications Prior to Visit  Medication Sig Dispense Refill   acyclovir (ZOVIRAX) 800 MG tablet TAKE 1 TABLET TWICE A DAY 180 tablet 3   amLODipine (NORVASC) 10 MG tablet Take 1 tablet (10 mg total) by mouth daily. 90 tablet 3   apixaban (ELIQUIS) 5 MG TABS tablet Hold until outpatient followup with neurosurgery. 180 tablet 3   benzonatate (TESSALON) 100 MG capsule Take 1 capsule (100 mg total) by mouth 3 (three) times daily as needed for cough. 30 capsule 2   ciclopirox (PENLAC)  8 % solution Apply topically.     clopidogrel (PLAVIX) 75 MG tablet Hold until outpatient followup with neurosurgery. 30 tablet 0   COD LIVER OIL PO Take 1 capsule by mouth 2 (two) times daily. 10 mcg     dofetilide (TIKOSYN) 500 MCG capsule TAKE ONE CAPSULE BY MOUTH TWICE A DAY 180 capsule 2   ezetimibe (ZETIA) 10 MG tablet TAKE 1 TABLET DAILY 90 tablet 1   gabapentin (NEURONTIN) 600 MG tablet Take 0.5-1 tablets (300-600 mg total) by mouth 3 (three) times daily as needed. 270 tablet 1   loratadine (CLARITIN) 10 MG tablet Take 1 tablet (10 mg total) by mouth daily.     losartan (COZAAR) 100 MG tablet Take 1 tablet (100 mg total) by mouth daily. 90 tablet 2   meloxicam (MOBIC) 15 MG tablet Take 15 mg by mouth daily.     metFORMIN (GLUCOPHAGE) 850 MG tablet Take 1 tablet (850 mg total) by mouth 2 (two) times daily with a meal. Resume 05/19/2022. 180 tablet 3   nitroGLYCERIN (NITROSTAT) 0.4 MG SL tablet Place 1 tablet (0.4 mg total) under the tongue every 5 (five) minutes as needed for chest pain (max 3 doses). 25 tablet 6   Omega-3 Fatty Acids (SALMON OIL PO) Take 1,000 mg by  mouth. 1 twice daily     pantoprazole (PROTONIX) 40 MG tablet Take 1 tablet (40 mg total) by mouth daily. 90 tablet 3   tamsulosin (FLOMAX) 0.4 MG CAPS capsule Take 0.4 mg by mouth daily.     timolol (BETIMOL) 0.5 % ophthalmic solution Place 1 drop into both eyes 2 (two) times daily.     valACYclovir (VALTREX) 1000 MG tablet Take 1 tablet (1,000 mg total) by mouth 2 (two) times daily as needed. 60 tablet 1   spironolactone (ALDACTONE) 25 MG tablet Take 1 tablet (25 mg total) by mouth daily. 90 tablet 3   No facility-administered medications prior to visit.     Per HPI unless specifically indicated in ROS section below Review of Systems  Constitutional:  Negative for fatigue and fever.  HENT:  Positive for rhinorrhea. Negative for ear pain.   Eyes:  Negative for pain.  Respiratory:  Negative for cough and shortness of  breath.   Cardiovascular:  Negative for chest pain, palpitations and leg swelling.  Gastrointestinal:  Negative for abdominal pain.  Genitourinary:  Negative for dysuria.  Musculoskeletal:  Negative for arthralgias.  Neurological:  Positive for dizziness and headaches. Negative for syncope and light-headedness.  Psychiatric/Behavioral:  Negative for dysphoric mood.    Objective:  BP 128/88 (BP Location: Left Arm, Patient Position: Sitting, Cuff Size: Normal)   Pulse (!) 57   Temp (!) 97.4 F (36.3 C) (Temporal)   Ht '5\' 7"'$  (1.702 m)   Wt 159 lb (72.1 kg)   SpO2 96%   BMI 24.90 kg/m   Wt Readings from Last 3 Encounters:  10/12/22 159 lb (72.1 kg)  09/24/22 155 lb (70.3 kg)  09/23/22 155 lb 9.6 oz (70.6 kg)      Physical Exam Constitutional:      General: He is not in acute distress.    Appearance: Normal appearance. He is well-developed. He is not ill-appearing or toxic-appearing.  HENT:     Head: Normocephalic and atraumatic.     Right Ear: Hearing, tympanic membrane, ear canal and external ear normal. No tenderness. No foreign body. Tympanic membrane is not retracted or bulging.     Left Ear: Hearing, tympanic membrane, ear canal and external ear normal. No tenderness. No foreign body. Tympanic membrane is not retracted or bulging.     Nose: Mucosal edema and rhinorrhea present.     Right Turbinates: Swollen.     Left Turbinates: Swollen.     Right Sinus: Frontal sinus tenderness present. No maxillary sinus tenderness.     Left Sinus: Frontal sinus tenderness present. No maxillary sinus tenderness.     Mouth/Throat:     Dentition: Normal dentition. No dental caries.     Pharynx: Uvula midline. No oropharyngeal exudate.     Tonsils: No tonsillar abscesses.  Eyes:     General: Lids are normal. Lids are everted, no foreign bodies appreciated.     Conjunctiva/sclera: Conjunctivae normal.     Pupils: Pupils are equal, round, and reactive to light.  Neck:     Thyroid: No  thyroid mass or thyromegaly.     Vascular: No carotid bruit.     Trachea: Trachea and phonation normal.  Cardiovascular:     Rate and Rhythm: Normal rate and regular rhythm.     Pulses: Normal pulses.     Heart sounds: Normal heart sounds, S1 normal and S2 normal. No murmur heard.    No gallop.  Pulmonary:     Effort: Pulmonary effort  is normal. No respiratory distress.     Breath sounds: Normal breath sounds. No wheezing, rhonchi or rales.  Abdominal:     General: Bowel sounds are normal.     Palpations: Abdomen is soft.     Tenderness: There is no abdominal tenderness. There is no guarding or rebound.     Hernia: No hernia is present.  Musculoskeletal:     Cervical back: Normal range of motion and neck supple.  Skin:    General: Skin is warm and dry.     Findings: No rash.  Neurological:     Mental Status: He is alert.     Deep Tendon Reflexes: Reflexes are normal and symmetric.  Psychiatric:        Speech: Speech normal.        Behavior: Behavior normal.        Judgment: Judgment normal.       Results for orders placed or performed during the hospital encounter of 09/24/22  CBC  Result Value Ref Range   WBC 6.5 4.0 - 10.5 K/uL   RBC 4.72 4.22 - 5.81 MIL/uL   Hemoglobin 14.9 13.0 - 17.0 g/dL   HCT 44.9 39.0 - 52.0 %   MCV 95.1 80.0 - 100.0 fL   MCH 31.6 26.0 - 34.0 pg   MCHC 33.2 30.0 - 36.0 g/dL   RDW 13.0 11.5 - 15.5 %   Platelets 183 150 - 400 K/uL   nRBC 0.0 0.0 - 0.2 %  Comprehensive metabolic panel  Result Value Ref Range   Sodium 136 135 - 145 mmol/L   Potassium 4.3 3.5 - 5.1 mmol/L   Chloride 103 98 - 111 mmol/L   CO2 25 22 - 32 mmol/L   Glucose, Bld 133 (H) 70 - 99 mg/dL   BUN 21 8 - 23 mg/dL   Creatinine, Ser 0.82 0.61 - 1.24 mg/dL   Calcium 9.7 8.9 - 10.3 mg/dL   Total Protein 7.2 6.5 - 8.1 g/dL   Albumin 4.4 3.5 - 5.0 g/dL   AST 22 15 - 41 U/L   ALT 15 0 - 44 U/L   Alkaline Phosphatase 77 38 - 126 U/L   Total Bilirubin 1.3 (H) 0.3 - 1.2 mg/dL    GFR, Estimated >60 >60 mL/min   Anion gap 8 5 - 15  Magnesium  Result Value Ref Range   Magnesium 2.2 1.7 - 2.4 mg/dL  TSH  Result Value Ref Range   TSH 2.566 0.350 - 4.500 uIU/mL  Sedimentation rate  Result Value Ref Range   Sed Rate 4 0 - 20 mm/hr  C-reactive protein  Result Value Ref Range   CRP 0.7 <1.0 mg/dL  Hemoglobin A1c  Result Value Ref Range   Hgb A1c MFr Bld 6.3 (H) 4.8 - 5.6 %   Mean Plasma Glucose 134.11 mg/dL  CBC  Result Value Ref Range   WBC 6.4 4.0 - 10.5 K/uL   RBC 4.22 4.22 - 5.81 MIL/uL   Hemoglobin 13.6 13.0 - 17.0 g/dL   HCT 39.2 39.0 - 52.0 %   MCV 92.9 80.0 - 100.0 fL   MCH 32.2 26.0 - 34.0 pg   MCHC 34.7 30.0 - 36.0 g/dL   RDW 12.9 11.5 - 15.5 %   Platelets 165 150 - 400 K/uL   nRBC 0.0 0.0 - 0.2 %  Glucose, capillary  Result Value Ref Range   Glucose-Capillary 134 (H) 70 - 99 mg/dL  Glucose, capillary  Result Value Ref Range  Glucose-Capillary 160 (H) 70 - 99 mg/dL  Glucose, capillary  Result Value Ref Range   Glucose-Capillary 226 (H) 70 - 99 mg/dL  Troponin I (High Sensitivity)  Result Value Ref Range   Troponin I (High Sensitivity) 3 <18 ng/L    Assessment and Plan  Chronic sphenoidal sinusitis Assessment & Plan: Chronic, incidental finding on recent CT but patient has been symptomatic in the last 3 weeks.   No clear evidence of bacterial infection.  No purulent nasal discharge. Most likely secondary to chronic allergies.  Will have him start Xyzal and complete low-dose prednisone taper to treat inflammation.  If his symptoms or not improving I will refer to ear nose and throat for further treatment.   Other orders -     predniSONE; 2 tabs by mouth daily x 5 days, then 1 tabs by mouth daily x 5 days.  Dispense: 15 tablet; Refill: 0    No follow-ups on file.   Eliezer Lofts, MD

## 2022-10-12 NOTE — Assessment & Plan Note (Signed)
Chronic, incidental finding on recent CT but patient has been symptomatic in the last 3 weeks.   No clear evidence of bacterial infection.  No purulent nasal discharge. Most likely secondary to chronic allergies.  Will have him start Xyzal and complete low-dose prednisone taper to treat inflammation.  If his symptoms or not improving I will refer to ear nose and throat for further treatment.

## 2022-10-12 NOTE — Patient Instructions (Addendum)
Complete prednisone taper x 10 days.  Start Xyzal at bedtime for allergy component possibility.  If not improving headache symptoms.. call for referral to ENT.

## 2022-10-18 ENCOUNTER — Ambulatory Visit (INDEPENDENT_AMBULATORY_CARE_PROVIDER_SITE_OTHER): Payer: Medicare Other | Admitting: Family Medicine

## 2022-10-18 ENCOUNTER — Encounter: Payer: Self-pay | Admitting: Family Medicine

## 2022-10-18 VITALS — BP 118/60 | HR 64 | Temp 98.2°F | Ht 67.0 in | Wt 158.0 lb

## 2022-10-18 DIAGNOSIS — I609 Nontraumatic subarachnoid hemorrhage, unspecified: Secondary | ICD-10-CM | POA: Diagnosis not present

## 2022-10-18 DIAGNOSIS — I480 Paroxysmal atrial fibrillation: Secondary | ICD-10-CM | POA: Diagnosis not present

## 2022-10-18 DIAGNOSIS — I2510 Atherosclerotic heart disease of native coronary artery without angina pectoris: Secondary | ICD-10-CM

## 2022-10-18 DIAGNOSIS — R21 Rash and other nonspecific skin eruption: Secondary | ICD-10-CM

## 2022-10-18 MED ORDER — NYSTATIN 100000 UNIT/GM EX POWD: 1.0000 | Freq: Two times a day (BID) | CUTANEOUS | 1 refills | Status: AC

## 2022-10-18 MED ORDER — APIXABAN 5 MG PO TABS: 5.0000 mg | ORAL_TABLET | Freq: Two times a day (BID) | ORAL | 3 refills | Status: DC

## 2022-10-18 MED ORDER — CLOPIDOGREL BISULFATE 75 MG PO TABS: 75.0000 mg | ORAL_TABLET | Freq: Every day | ORAL | 0 refills | Status: DC

## 2022-10-18 NOTE — Progress Notes (Unsigned)
Recent inpatient course d/w pt:    Hospital Course:  For full details, please see H&P, progress notes, consult notes and ancillary notes.  Briefly,  [name] [name] is a 82 y.o. 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   Pt was seen in the ED on 09/22/2022 after patient was found wandering around his tractor confused.    * Subarachnoid and subdural hemorrhage (HCC) Likely trauma was 2 days ago when he was found walking around his home confused, and daughter stated he had a small goose egg on his head.  Pt was on Eliquis and plavix PTA. --Neurosurgery consulted with Dr. Dava Najjar.  Repeat head CT x2 were stable since presentation.  Pt was cleared for discharge by neurosurgery and will f/u outpatient with neurosurgery in 1-2 weeks. --Hold Eliquis and plavix until neurosurgery f/u.   CAD s/p CABG and more recently, DES in October 2023 He was on Plavix and Eliquis PTA.  --admitting physician discussed with cardio Dr. Garen Lah. Okay to hold Plavix and Eliquis until cleared from a neurosurgical/neuro standpoint.  --Hold Eliquis and plavix until neurosurgery f/u.   Persistent atrial fibrillation (HCC) - Holding Eliquis due to [facility] and [facility] - Continue home Tikosyn   DM type 2 ([place]) --A1c 6.3, well controlled --resume home metformin after discharge.   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   apixaban 5 MG Tabs tablet Commonly known as: ELIQUIS Hold until outpatient followup with neurosurgery.  benzonatate 100 MG capsule Commonly known as: TESSALON Take 1 capsule (100 mg total) by mouth 3 (three) times daily as needed for cough.   ciclopirox 8 % solution Commonly known as: PENLAC Apply topically.   clopidogrel 75 MG tablet Commonly known as: PLAVIX Hold until outpatient followup with neurosurgery.   COD LIVER OIL PO Take 1 capsule by mouth 2 (two) times daily. 10 mcg   dofetilide 500 MCG capsule Commonly known as: TIKOSYN TAKE ONE CAPSULE BY MOUTH TWICE A DAY   ezetimibe 10 MG tablet Commonly known as: ZETIA TAKE 1 TABLET DAILY   gabapentin 600 MG tablet Commonly known as: NEURONTIN Take 0.5-1 tablets (300-600 mg total) by mouth 3 (three) times daily as needed.   loratadine 10 MG tablet Commonly known as: CLARITIN Take 1 tablet (10 mg total) by mouth daily.   losartan 100 MG tablet Commonly known as: COZAAR Take 1 tablet (100 mg total) by mouth daily.   meloxicam 15 MG tablet Commonly known as: MOBIC Take 15 mg by mouth daily.   metFORMIN 850 MG tablet Commonly known as: GLUCOPHAGE Take 1 tablet (850 mg total) by mouth 2 (two) times daily with a meal. Resume 05/19/2022.   nitroGLYCERIN 0.4 MG SL tablet Commonly known as: NITROSTAT Place 1 tablet (  0.4 mg total) under the tongue every 5 (five) minutes as needed for chest pain (max 3 doses).   pantoprazole 40 MG tablet Commonly known as: Protonix Take 1 tablet (40 mg total) by mouth daily.   SALMON OIL PO Take 1,000 mg by mouth. 1 twice daily   spironolactone 25 MG tablet Commonly known as: ALDACTONE Take 1 tablet (25 mg total) by mouth daily.   tamsulosin 0.4 MG Caps capsule Commonly known as: FLOMAX Take 0.4 mg by mouth  daily.   timolol 0.5 % ophthalmic solution Commonly known as: BETIMOL Place 1 drop into both eyes 2 (two) times daily.   valACYclovir 1000 MG tablet Commonly known as: VALTREX Take 1 tablet (1,000 mg total) by mouth 2 (two) times daily as needed. ===================================== He had neurosurgery follow-up in the meantime.  He was cleared to restart anticoagulation.  HA and confusion are clearly better in the meantime.  We talked about post concussion sx.  Discussed HA sx, concentration/recall/lapses.  Discussed brain and physical rest with gradual return to activity.    Discussed stopping meloxicam.  He hasn't started prednisone yet, it was ordered through mail order.    He needs help with dofetilide rx coverage, d/w pt.  See following pharmacy noted. He needs cardiology f/u- will route to cards for input.    Rash.  Improved with ciclopirox 0.77% and miconazole powder.  But then it comes back.  Only on L inguinal area.    Meds, vitals, and allergies reviewed.   ROS: Per HPI unless specifically indicated in ROS section   Nad, alert and oriented Ncat Neck supple, no LA Rrr Ctab He has a typical appearing superficial fungal infection L groin without ulceration. Skin well-perfused without edema. Speech normal.  Judgment intact.

## 2022-10-18 NOTE — Patient Instructions (Addendum)
I would stop meloxicam.  Use tylenol if needed for pain.    If you don't improve with prednisone, then let us know so we can set up ENT visit.    Use nystatin until better and then for 2 extra days.    Update me as needed.   I'll check with cardiology in the meantime.    Take care.  Glad to see you.

## 2022-10-19 ENCOUNTER — Ambulatory Visit: Payer: Medicare Other | Admitting: Pharmacist

## 2022-10-19 NOTE — Assessment & Plan Note (Signed)
History of.  Will route to cardiology about establishing follow-up appointment.  Pharmacy is addressing dofetilide coverage.  Discussed stopping meloxicam.  He is back on anticoagulation in the meantime.

## 2022-10-19 NOTE — Progress Notes (Signed)
Care Management & Coordination Services Pharmacy Note  10/19/2022 Name:  [name] [name] MRN:  [id number] DOB:  Jun 30, 1941  Summary: F/U visit -Recent SAH/concussion - pt verifies he has restarted Eliquis and Clopidogrel after holding for several days following bleed, per instructions from neurosurgery -HTN: BP at goal in recent OV, however pt's wife reports BP has been "high" but unable to provide values; discussed BP goal < 140/90 -Low HR: pt reports HR at home has been in 50s, he has hx of sinus node dysfunction and has previously declined pacemaker; pt reports upcoming cardiology appt in a few weeks -Afib: on dofetilide, he had tier exception last year approved through 12/31/22, this will need to be renewed in May to continue with cost savings  Recommendations/Changes made from today's visit: -No med changes -Advised to monitor BP/HR and keep a log, bring log and discuss concerns at upcoming cardiology appt -PharmD to renew Dofetilide tier exception in May  Follow up plan: -[facility] will call patient 1 month for BP update -Pharmacist follow up televisit scheduled for 3 months    Subjective: [name] [name] is an 82 y.o. year old male who is a primary patient of Damita Dunnings, Elveria Rising, MD.  The care coordination team was consulted for assistance with disease management and care coordination needs.    Engaged with patient by telephone for follow up visit.  Recent office visits: 10/18/22 Dr Damita Dunnings OV: hospital f/u - needs help with dofetilide Rx. Needs cardiology f/u. Discussed stopping meloxicam.   10/12/22 Dr Diona Browner OV: sinusitis - rx prednisone x 10 days  Recent consult visits: 10/11/22 Dr Gaynelle Arabian (Neurosurgery): f/u [facility] Resume Plavix. Did not mention Eliquis. F/u PRN  Hospital visits: 09/24/22 - 09/25/22 Admission [facility]): SAH - hold eliquis and plavix until neurosurgery f/u 1-2 weeks.  09/22/22 ED visit ([facility]): amnesia - possible fall that he does not remember. CT head w/o acute  finding. Possible seizure - rec outpatient EEG.   Objective:  Lab Results  Component Value Date   CREATININE 0.82 09/24/2022   BUN 21 09/24/2022   GFR 78.77 06/24/2022   EGFR 89 04/27/2021   GFRNONAA >60 09/24/2022   GFRAA >60 09/28/2017   NA 136 09/24/2022   K 4.3 09/24/2022   CALCIUM 9.7 09/24/2022   CO2 25 09/24/2022   GLUCOSE 133 (H) 09/24/2022    Lab Results  Component Value Date/Time   HGBA1C 6.3 (H) 09/25/2022 04:19 AM   HGBA1C 6.4 12/16/2021 12:55 PM   GFR 78.77 06/24/2022 12:42 PM   GFR 80.11 12/16/2021 12:55 PM   MICROALBUR 1.3 06/02/2014 10:30 AM    Last diabetic Eye exam:  Lab Results  Component Value Date/Time   HMDIABEYEEXA No Retinopathy 06/01/2020 12:00 AM    Last diabetic Foot exam: No results found for: "HMDIABFOOTEX"   Lab Results  Component Value Date   CHOL 112 12/16/2021   HDL 36.10 (L) 12/16/2021   LDLCALC 62 06/07/2017   LDLDIRECT 67.0 12/16/2021   TRIG 220.0 (H) 12/16/2021   CHOLHDL 3 12/16/2021       Latest Ref Rng & Units 09/24/2022    9:17 AM 09/22/2022   11:05 AM 12/16/2021   12:55 PM  Hepatic Function  Total Protein 6.5 - 8.1 g/dL 7.2  7.3  7.3   Albumin 3.5 - 5.0 g/dL 4.4  4.7  4.6   AST 15 - 41 U/L '22  18  18   '$ ALT 0 - 44 U/L '15  13  13   '$ Alk  Phosphatase 38 - 126 U/L 77  71  85   Total Bilirubin 0.3 - 1.2 mg/dL 1.3  1.2  1.1     Lab Results  Component Value Date/Time   TSH 2.566 09/24/2022 09:17 AM   TSH 1.80 10/29/2019 07:44 AM   TSH 1.65 09/13/2018 08:11 AM       Latest Ref Rng & Units 09/25/2022    4:19 AM 09/24/2022    9:17 AM 09/22/2022   11:05 AM  CBC  WBC 4.0 - 10.5 K/uL 6.4  6.5  5.9   Hemoglobin 13.0 - 17.0 g/dL 13.6  14.9  14.5   Hematocrit 39.0 - 52.0 % 39.2  44.9  41.9   Platelets 150 - 400 K/uL 165  183  170     Lab Results  Component Value Date/Time   VD25OH 35.67 01/18/2017 01:16 PM   VITAMINB12 717 01/18/2017 01:16 PM   VITAMINB12 290 07/26/2016 09:33 AM    Clinical ASCVD: Yes  The ASCVD  Risk score (Arnett DK, et al., 2019) failed to calculate for the following reasons:   The 2019 ASCVD risk score is only valid for ages 44 to 70   The patient has a prior MI or stroke diagnosis    CHA2DS2/VAS Stroke Risk Points  Current as of about an hour ago     7 >= 2 Points: High Risk  1 - 1.99 Points: Medium Risk  0 Points: Low Risk    No Change     Points Metrics  0 Has Congestive Heart Failure:  No    Current as of about an hour ago  1 Has Vascular Disease:  Yes    Current as of about an hour ago  1 Has Hypertension:  Yes    Current as of about an hour ago  2 Age:  55    Current as of about an hour ago  1 Has Diabetes:  Yes    Current as of about an hour ago  2 Had Stroke:  Yes  Had TIA:  No  Had Thromboembolism:  No    Current as of about an hour ago  0 Male:  No    Current as of about an hour ago       10/18/2022    9:14 AM 10/12/2022   12:03 PM 12/14/2021   11:50 AM  Depression screen PHQ 2/9  Decreased Interest 0 0 0  Down, Depressed, Hopeless 0 0 0  PHQ - 2 Score 0 0 0  Altered sleeping 0 0   Tired, decreased energy 0 2   Change in appetite 0 0   Feeling bad or failure about yourself  0 0   Trouble concentrating 0 0   Moving slowly or fidgety/restless 0 0   Suicidal thoughts 0 0   PHQ-9 Score 0 2   Difficult doing work/chores Not difficult at all Not difficult at all      Social History   Tobacco Use  Smoking Status Never  Smokeless Tobacco Never   BP Readings from Last 3 Encounters:  10/18/22 118/60  10/12/22 128/88  09/25/22 (!) 134/58   Pulse Readings from Last 3 Encounters:  10/18/22 64  10/12/22 (!) 57  09/25/22 100   Wt Readings from Last 3 Encounters:  10/18/22 158 lb (71.7 kg)  10/12/22 159 lb (72.1 kg)  09/24/22 155 lb (70.3 kg)   BMI Readings from Last 3 Encounters:  10/18/22 24.75 kg/m  10/12/22 24.90 kg/m  09/24/22 24.28  kg/m    Allergies  Allergen Reactions   Fenofibrate Other (See Comments)    Muscle weakness/pain    Fire Ant     Allergic reaction   Statins Other (See Comments)    MYALGIA, muscle pain    Medications Reviewed Today     Reviewed by Filbert Berthold, CMA (Certified Medical Assistant) on 10/18/22 at [place] List Status: <None>   Medication Order Taking? Sig Documenting Provider Last Dose Status Informant  acyclovir (ZOVIRAX) 800 MG tablet [id number] Yes TAKE 1 TABLET TWICE A DAY Tonia Ghent, MD Taking Active Self  amLODipine (NORVASC) 10 MG tablet [id number] Yes Take 1 tablet (10 mg total) by mouth daily. Theora Gianotti, NP Taking Active Self  apixaban (ELIQUIS) 5 MG TABS tablet [id number] Yes Hold until outpatient followup with neurosurgery. Enzo Bi, MD Taking Active   ciclopirox [facility]) 8 % solution [id number] Yes Apply topically. [provider] Taking Active Self  clopidogrel (PLAVIX) 75 MG tablet [id number] Yes Hold until outpatient followup with neurosurgery. Enzo Bi, MD Taking Active   COD LIVER OIL PO [id number] Yes Take 1 capsule by mouth 2 (two) times daily. 10 mcg [provider] Taking Active Self  dofetilide (TIKOSYN) 500 MCG capsule [id number] Yes TAKE ONE CAPSULE BY MOUTH TWICE A DAY Gollan, Kathlene November, MD Taking Active Self  ezetimibe (ZETIA) 10 MG tablet [id number] Yes TAKE 1 TABLET DAILY Gollan, Kathlene November, MD Taking Active Self  gabapentin (NEURONTIN) 600 MG tablet [id number] Yes Take 0.5-1 tablets (300-600 mg total) by mouth 3 (three) times daily as needed. Tonia Ghent, MD Taking Active Self  loratadine (CLARITIN) 10 MG tablet [id number] Yes Take 1 tablet (10 mg total) by mouth daily. Tonia Ghent, MD Taking Active Self  losartan (COZAAR) 100 MG tablet [id number] Yes Take 1 tablet (100 mg total) by mouth daily. Evans Lance, MD Taking Active Self  meloxicam [facility]) 15 MG tablet [id number] Yes Take 15 mg by mouth daily. [provider] Taking Active Self           Med Note Tonia Ghent   Fri Jun 24, 2022 12:25 PM)     metFORMIN (GLUCOPHAGE) 850 MG tablet [id number] Yes Take 1 tablet (850 mg total) by mouth 2 (two) times daily with a meal. Resume 05/19/2022. Rise Mu, PA-C Taking Active Self  nitroGLYCERIN (NITROSTAT) 0.4 MG SL tablet [id number] Yes Place 1 tablet (0.4 mg total) under the tongue every 5 (five) minutes as needed for chest pain (max 3 doses). Theora Gianotti, NP Taking Active Self           Med Note Tonia Ghent   Fri Jun 24, 2022 12:26 PM)    Omega-3 Fatty Acids (SALMON OIL PO) [id number] Yes Take 1,000 mg by mouth. 1 twice daily [provider] Taking Active Self  pantoprazole (PROTONIX) 40 MG tablet [id number] Yes Take 1 tablet (40 mg total) by mouth daily. Rise Mu, PA-C Taking Active Self  predniSONE (DELTASONE) 20 MG tablet [id number] Yes 2 tabs by mouth daily x 5 days, then 1 tabs by mouth daily x 5 days. Jinny Sanders, MD Taking Active   spironolactone (ALDACTONE) 25 MG tablet [id number]  Take 1 tablet (25 mg total) by mouth daily. Theora Gianotti, NP  Expired 10/04/22 2359 Self  tamsulosin (FLOMAX) 0.4 MG CAPS capsule [id number] Yes Take 0.4 mg by mouth daily. [provider] Taking Active Self  timolol (BETIMOL) 0.5 % ophthalmic solution  [id number] Yes Place 1 drop into both eyes 2 (two) times daily. [provider] Taking Active Self  valACYclovir (VALTREX) 1000 MG tablet [id number] Yes Take 1 tablet (1,000 mg total) by mouth 2 (two) times daily as needed. Tonia Ghent, MD Taking Active Self            SDOH:  (Social Determinants of Health) assessments and interventions performed: No SDOH Interventions    Flowsheet Row Office Visit from 10/18/2022 in [place] at [facility] Visit from 10/12/2022 in [place] at [place] from 12/14/2021 in [place] at [place] Management from 09/23/2020 in [place] at  [place] Interventions -- -- Intervention Not Indicated --  Housing Interventions -- -- Intervention Not Indicated --  Transportation Interventions -- -- Intervention Not Indicated --  Depression Interventions/Treatment  Counseling Counseling -- --  Financial Strain Interventions -- -- Intervention Not Indicated Intervention Not Indicated  [Medications affordable]  Physical Activity Interventions -- -- Intervention Not Indicated --  Stress Interventions -- -- Intervention Not Indicated --  Social Connections Interventions -- -- Intervention Not Indicated --       Medication Assistance:  Dofetilide - tier exception approved 12/2021 - 12/31/22  Medication Access: Within the past 30 days, how often has patient missed a dose of medication? 0 Is a pillbox or other method used to improve adherence? Yes  Factors that may affect medication adherence? financial need Are meds synced by current pharmacy? No  Are meds delivered by current pharmacy? Yes  Does patient experience delays in picking up medications due to transportation concerns? No   [facility] Services Reviewed: Is patient disadvantaged to use [facility]?: Yes  Current Rx insurance plan: [place] Name and location of Current pharmacy:  [place], [place] [address] [place] [address] Phone: [phone] Fax: [phone]  [facility] [id number] - [place] [address], [place] [address] Phone: [phone] Fax: [phone]  [id number] [place] [address]: [phone]: [address]Phone: [phone]: [phone] [facility] services reviewed with patient today?: No  Patient requests to  transfer care to [facility]?: No  Reason patient declined to change pharmacies: Disadvantaged due to insurance/mail order  Compliance/Adherence/Medication fill history: Care Gaps: UACR (due 05/2015) Foot exam (due 10/2020) Eye exam (due 05/2021)  Star-Rating Drugs: Losartan - PDC unk; LF 10/15/22, 05/03/22 Metformin - PDC 100%   Assessment/Plan  Hypertension (BP goal <140/90) -Query Controlled - at goal in recent office visits, pt wife reports BP has been "high" but unable to provider any values; discussed BP goal -Home BP readings: none available -Current treatment: Amlodipine 10 mg daily -Appropriate, Query Effective Losartan 100 mg daily -Appropriate, Query Effective, Spironolactone 25 mg daily - Appropriate, Query Effective -Medications previously tried: carvedilol (presyncope), HCTZ (dizziness), hydralazine (tachycardia) -Discussed BP goal and advised to keep a log; advised to call if BP consistently > 150/90 -Recommend continue current medications  Hyperlipidemia/ CAD (LDL goal < 70) -Controlled - LDL 67 (12/2021) at goal, TRIG 220 elevated but adequate; pt has restarted clopidogrel after SAH in Feb, cleared by neurosurgery -Hx CAD, CABG x 6 -  Current treatment: Ezetimibe 10 mg daily -Appropriate, Effective, Safe, Accessible Clopidogrel 75 mg daily - Appropriate, Effective, Safe, Accessible Cod Liver 10 mcg twice daily -Appropriate, Effective, Safe, Accessible Salmon oil 1000 mg twice daily -Appropriate, Effective, Safe, Accessible Nitroglycerin 0.4 mg SL prn -Appropriate, Effective, Safe, Accessible -Medications previously tried: statins (simvastatin), fenofibrate  -Educated on Cholesterol goals; LDL within goal of < 70 -Recommended to continue current medication  Diabetes (A1c goal <7%) -Controlled - A1c 6.3% (09/2022) at goal, in prediabetic range -Current medications: Metformin 850 mg BID - Appropriate, Effective, Safe, Accessible -Medications previously tried:  metformin 1000 mg BID (GI) -Recommended to continue current medication  Atrial Fibrillation (Goal: prevent stroke and major bleeding) -Controlled - follows with cardiology; pt recently resumed Eliquis after SAH in Feb following an unwitnessed fall (pt amnesic to fall/head trauma) -CHADSVASC: 6 -Pt has hx of sinus node dysfunction, he has previously declined pacemaker -Current treatment: Dofetilide 500 mcg BID - Appropriate, Effective, Safe, Accessible Eliquis 5 mg BID - Appropriate, Effective, Query Safe -Medications previously tried: carvedilol, amiodarone, metoprolol -Reviewed Eliquis cost savings - patient will not qualify for BMS PAP, there is no other savings programs available -Reviewed dofetilide cost savings - he received tier exception last year (through 12/31/22), will need to renew tier exception in May -Recommended to continue current medication; discussed importance of cardiology f/u   Charlene Brooke, PharmD, [place] Pharmacist [place] at [facility] [phone]

## 2022-10-19 NOTE — Patient Instructions (Signed)
Visit Information  Phone number for Pharmacist: [phone]  Thank you for meeting with me to discuss your medications! Below is a summary of what we talked about during the visit:   Recommendations/Changes made from today's visit: -No med changes -Advised to monitor BP/HR and keep a log, bring log and discuss concerns at upcoming cardiology appt -PharmD to renew Dofetilide tier exception in May  Follow up plan: -[place] will call patient 1 month for BP update -Pharmacist follow up televisit scheduled for 3 months   Charlene Brooke, PharmD, BCACP Clinical Pharmacist [facility] at [facility] [phone]

## 2022-10-19 NOTE — Assessment & Plan Note (Signed)
Benign-appearing superficial fungal infection.  Can try nystatin powder and then update me as needed.

## 2022-10-19 NOTE — Assessment & Plan Note (Signed)
Discussed postconcussive symptoms.  He is improving in the meantime.  He does not have focal neurologic symptoms and is okay for outpatient follow-up.

## 2022-10-21 ENCOUNTER — Ambulatory Visit: Payer: Medicare Other | Admitting: Physician Assistant

## 2022-10-27 ENCOUNTER — Ambulatory Visit: Payer: Medicare Other | Attending: Nurse Practitioner | Admitting: Nurse Practitioner

## 2022-10-27 ENCOUNTER — Encounter: Payer: Self-pay | Admitting: Nurse Practitioner

## 2022-10-27 VITALS — BP 126/50 | HR 70 | Ht 67.0 in | Wt 158.5 lb

## 2022-10-27 DIAGNOSIS — I10 Essential (primary) hypertension: Secondary | ICD-10-CM | POA: Diagnosis not present

## 2022-10-27 DIAGNOSIS — S065XAA Traumatic subdural hemorrhage with loss of consciousness status unknown, initial encounter: Secondary | ICD-10-CM

## 2022-10-27 DIAGNOSIS — I609 Nontraumatic subarachnoid hemorrhage, unspecified: Secondary | ICD-10-CM

## 2022-10-27 DIAGNOSIS — I25810 Atherosclerosis of coronary artery bypass graft(s) without angina pectoris: Secondary | ICD-10-CM

## 2022-10-27 DIAGNOSIS — E119 Type 2 diabetes mellitus without complications: Secondary | ICD-10-CM | POA: Diagnosis not present

## 2022-10-27 DIAGNOSIS — I480 Paroxysmal atrial fibrillation: Secondary | ICD-10-CM | POA: Diagnosis not present

## 2022-10-27 DIAGNOSIS — E785 Hyperlipidemia, unspecified: Secondary | ICD-10-CM

## 2022-10-27 NOTE — Patient Instructions (Signed)
Medication Instructions:  Your physician has recommended you make the following change in your medication:  STOP - clopidogrel (PLAVIX) 75 MG tablet *If you need a refill on your cardiac medications before your next appointment, please call your pharmacy*  Lab Work: -None ordered If you have labs (blood work) drawn today and your tests are completely normal, you will receive your results only by: [place] (if you have MyChart) OR A paper copy in the mail If you have any lab test that is abnormal or we need to change your treatment, we will call you to review the results.  Testing/Procedures: -None ordered  Follow-Up: At [facility], you and your health needs are our priority.  As part of our continuing mission to provide you with exceptional heart care, we have created designated Provider Care Teams.  These Care Teams include your primary Cardiologist (physician) and Advanced Practice Providers (APPs -  Physician Assistants and Nurse Practitioners) who all work together to provide you with the care you need, when you need it.  We recommend signing up for the patient portal called "MyChart".  Sign up information is provided on this After Visit Summary.  MyChart is used to connect with patients for Virtual Visits (Telemedicine).  Patients are able to view lab/test results, encounter notes, upcoming appointments, etc.  Non-urgent messages can be sent to your provider as well.   To learn more about what you can do with MyChart, go to [email or web address].    Your next appointment:   6 week(s)  Provider:   You may see Ida Rogue, MD or one of the following Advanced Practice Providers on your designated Care Team:   Murray Hodgkins, NP  Other Instructions -None

## 2022-10-27 NOTE — Progress Notes (Signed)
Office Visit    Patient Name: [name] [name] Date of Encounter: 10/27/2022  Primary Care Provider:  Tonia Ghent, MD Primary Cardiologist:  Ida Rogue, MD  Chief Complaint    82 year old male with a history of CAD status post CABG times 01/2014, type 2 diabetes mellitus, hypertension, hyperlipidemia, PFO, embolic CVA, and paroxysmal atrial fibrillation, presents for follow-up after recent subdural hematoma.  Past Medical History    Past Medical History:  Diagnosis Date   Anxiety    Back pain    Coronary artery disease 08/21/2013   a. 08/2013 s/p CABG x 6; b. 05/2022 PCI: LAD 155m, 60d, D2 50, LCX 80ost, 42m, 63m/d, OM1 100, RCA 100p, VG->OM2->OM3 nl, VG->D2 30ost, 99d @ Diag insertion (2.25x15 Onyx Frontier DES), VG->RCA nl, LIMA->LAD nl. EF 55-65%.   Diabetes mellitus    Diabetic neuropathy (HCC)    Diastolic dysfunction    a. 02/2017 Echo: EF 55-60%, no rwma, Gr1 DD, mildly dil Ao Root/RV. + PFO.   Diverticulosis    Diverticulosis    Embolic stroke ([place])    a. 04/2017 MRI in setting of freq falls: small subacute infarction of bilat centrum semi-ovale consistent w/ embolic event-->Eliquis.   Erectile dysfunction    Family history of anesthesia complication    ' they cant wake my brother very easy"   Fatty liver    Flank pain    GERD (gastroesophageal reflux disease)    History of bronchitis    History of chicken pox    HSV infection    ocular symptoms and oral lesions   Hypercholesterolemia    Hypertension    Kidney stones    Osteoarthritis    PAF (paroxysmal atrial fibrillation) ([place])    a. Had post-op AF 08/2013; b. 09/2015 Holter: PAC's no AF; c. 05/2017 Noted to be in AFib-->Eliquis (CHA2DS2VASc = 7); d. 08/2017 s/p DCCV-->ERAF-->Tikosyn added; e. 08/2020 Zio: 4% Afib burden.   PFO (patent foramen ovale)    a. 02/2017 Echo: + PFO.   Sinus node dysfunction (HCC)    a. 08/2020 Zio: Predominantly sinus bradycardia at 53 (35-211).  20 SVT runs.  1 run of nonsustained  VT x4 beats.  4% A-fib burden.  16 pauses lasting up to 3.6 seconds.  Intermittent junctional rhythm.  Patient declined pacemaker.   Past Surgical History:  Procedure Laterality Date   CARDIAC CATHETERIZATION  08/21/2013   DR Angelena Form   CARDIOVERSION N/A 08/24/2017   Procedure: CARDIOVERSION;  Surgeon: Minna Merritts, MD;  Location: [facility];  Service: Cardiovascular;  Laterality: N/A;   [place] N/A 08/23/2013   Procedure: CORONARY ARTERY BYPASS GRAFTING (CABG);  Surgeon: Grace Isaac, MD;  Location: [place];  Service: Open Heart Surgery;  Laterality: N/A;  CABG times six utilizing the left internal mammary artery and the right greater saphenous vein harvested endoscopically   CORONARY STENT INTERVENTION N/A 05/16/2022   Procedure: CORONARY STENT INTERVENTION;  Surgeon: Wellington Hampshire, MD;  Location: [place] [facility];  Service: Cardiovascular;  Laterality: N/A;   ENDOVEIN HARVEST OF GREATER SAPHENOUS VEIN Right 08/23/2013   Procedure: ENDOVEIN HARVEST OF GREATER SAPHENOUS VEIN;  Surgeon: Grace Isaac, MD;  Location: [place];  Service: Open Heart Surgery;  Laterality: Right;   HERNIA REPAIR     X3   INTRAOPERATIVE TRANSESOPHAGEAL ECHOCARDIOGRAM N/A 08/23/2013   Procedure: INTRAOPERATIVE TRANSESOPHAGEAL ECHOCARDIOGRAM;  Surgeon: Grace Isaac, MD;  Location: [place];  Service: Open  Heart Surgery;  Laterality: N/A;   LEFT HEART CATH AND CORS/GRAFTS ANGIOGRAPHY N/A 05/16/2022   Procedure: LEFT HEART CATH AND CORS/GRAFTS ANGIOGRAPHY;  Surgeon: Wellington Hampshire, MD;  Location: [place] [facility];  Service: Cardiovascular;  Laterality: N/A;   LEFT HEART CATHETERIZATION WITH CORONARY ANGIOGRAM N/A 08/21/2013   Procedure: LEFT HEART CATHETERIZATION WITH CORONARY ANGIOGRAM;  Surgeon: Burnell Blanks, MD;  Location: [facility];  Service: Cardiovascular;  Laterality: N/A;    Allergies  Allergies  Allergen Reactions   Fenofibrate  Other (See Comments)    Muscle weakness/pain   Fire Ant     Allergic reaction   Statins Other (See Comments)    MYALGIA, muscle pain    History of Present Illness    82 year old male with above past medical history including CAD status post CABG times 01/2014, type 2 diabetes mellitus, hypertension, hyperlipidemia, PFO, embolic CVA, and paroxysmal atrial fibrillation.  He underwent CABG x 6 in 2015 and had postoperative A-fib which subsequently resolved after a short course of amiodarone.  In 2017, he complained of palpitations and Holter monitoring showed sinus rhythm and PACs.  In the spring 2018, he was seen by neurology in the setting multiple falls.  MRI of the brain in September 2018 showed small, subacute infarcts in the bilateral centrum semiovale consistent with prior embolic event.  It was suggested at that time that this was the result of paroxysmal atrial fibrillation and was placed on Eliquis 5 mg twice daily.  In October 2018, he was seen by cardiology and found to be in atrial fibrillation.  He was referred to A-fib clinic and it was initially recommended that he initiate Tikosyn, but he deferred.  He subsequently underwent cardioversion in January 2019 with early return of A-fib within 4 days.  He subsequently underwent Tikosyn loading.  Event monitoring in January 2022 following a syncopal episode showed predominantly sinus rhythm with a 4% A-fib burden, episodic junctional rhythm, and 16 pauses lasting up to 3.6 seconds.  He was offered permanent pacemaker by Dr. [name] but patient declined.  In October 2023, he was seen he reported 2 to 69-month history of progressive exertional chest burning and dyspnea.  Underwent diagnostic catheterization which showed three-vessel CAD with patent grafts including a LIMA to the LAD, vein graft to the OM 2/OM 3, vein graft to the RCA, and vein graft to the second diagonal.  The vein graft to the second diagonal has severe disease at the anastomosis to  the second diagonal, which was successfully treated with a drug-eluting stent.  He was initially placed on aspirin and Plavix, as he was not taking Eliquis at the time related to prostate infection and aspirin was subsequently discontinued once patient resumed Eliquis.  Mr. Gingery was seen in the emergency department on February 15 due to some alteration of mental status.  It was unclear if this was potentially preceded by a fall at some point, though today, Mr. Kravchuk says that he does not recall any fall.  CT of the head and cervical spine were unremarkable, and he was subsequently discharged home.  On February 17, he contacted our call staff reporting headache and blurred vision.  He was sent back to the emergency department where CT showed trace acute/subacute subarachnoid hemorrhage along the high right frontal lobe as well as trace subdural collections overlying the bilateral cerebral hemispheres.  Similar findings were noted on MRI.  He was seen by neurosurgery who recommended holding Eliquis and Plavix and otherwise conservative  therapy.  Head CT February 18 showed no progression of subdural and subarachnoid hemorrhages and he was subsequently discharged home.  He had follow-up imaging on March 1 which was unchanged and he was cleared to resume Eliquis and Plavix on March 5, by Dr. Izora Ribas.  Today, Mr. Krupa reports that he has generally been feeling well.  He is just trying to take it easy.  He occasionally notes a focal, mild left upper chest ache without associated symptoms, lasting a minute or so, and resolving spontaneously.  Symptoms or not similar to prior angina.  He denies dyspnea, palpitations, PND, orthopnea, dizziness, syncope, edema, or early satiety.  Home Medications    Current Outpatient Medications  Medication Sig Dispense Refill   acyclovir (ZOVIRAX) 800 MG tablet TAKE 1 TABLET TWICE A DAY 180 tablet 3   amLODipine (NORVASC) 10 MG tablet Take 1 tablet (10 mg total) by mouth daily. 90  tablet 3   apixaban (ELIQUIS) 5 MG TABS tablet Take 1 tablet (5 mg total) by mouth 2 (two) times daily. 180 tablet 3   ciclopirox (PENLAC) 8 % solution Apply topically.     COD LIVER OIL PO Take 1 capsule by mouth 2 (two) times daily. 10 mcg     dofetilide (TIKOSYN) 500 MCG capsule TAKE ONE CAPSULE BY MOUTH TWICE A DAY 180 capsule 2   ezetimibe (ZETIA) 10 MG tablet TAKE 1 TABLET DAILY 90 tablet 1   gabapentin (NEURONTIN) 600 MG tablet Take 0.5-1 tablets (300-600 mg total) by mouth 3 (three) times daily as needed. 270 tablet 1   isosorbide mononitrate (IMDUR) 30 MG 24 hr tablet Take 30 mg by mouth daily.     loratadine (CLARITIN) 10 MG tablet Take 1 tablet (10 mg total) by mouth daily.     losartan (COZAAR) 100 MG tablet Take 1 tablet (100 mg total) by mouth daily. 90 tablet 2   metFORMIN (GLUCOPHAGE) 850 MG tablet Take 1 tablet (850 mg total) by mouth 2 (two) times daily with a meal. Resume 05/19/2022. 180 tablet 3   nitroGLYCERIN (NITROSTAT) 0.4 MG SL tablet Place 1 tablet (0.4 mg total) under the tongue every 5 (five) minutes as needed for chest pain (max 3 doses). 25 tablet 6   nystatin (MYCOSTATIN/NYSTOP) powder Apply 1 Application topically 2 (two) times daily. Use until resolved and then 2 extra days. 60 g 1   Omega-3 Fatty Acids (SALMON OIL PO) Take 1,000 mg by mouth. 1 twice daily     pantoprazole (PROTONIX) 40 MG tablet Take 1 tablet (40 mg total) by mouth daily. 90 tablet 3   predniSONE (DELTASONE) 20 MG tablet 2 tabs by mouth daily x 5 days, then 1 tabs by mouth daily x 5 days. 15 tablet 0   tamsulosin (FLOMAX) 0.4 MG CAPS capsule Take 0.4 mg by mouth daily.     timolol (BETIMOL) 0.5 % ophthalmic solution Place 1 drop into both eyes 2 (two) times daily.     valACYclovir (VALTREX) 1000 MG tablet Take 1 tablet (1,000 mg total) by mouth 2 (two) times daily as needed. 60 tablet 1   spironolactone (ALDACTONE) 25 MG tablet Take 1 tablet (25 mg total) by mouth daily. 90 tablet 3   No current  facility-administered medications for this visit.     Review of Systems    Occasional focal left chest ache-not similar to prior angina.  He denies dyspnea, palpitations, PND, orthopnea, dizziness, syncope, edema, or early satiety.  All other systems reviewed and are otherwise  negative except as noted above.    Physical Exam    VS:  BP (!) 126/50 (BP Location: Left Arm, Patient Position: Sitting, Cuff Size: Normal)   Pulse 70   Ht 5\' 7"  (1.702 m)   Wt 158 lb 8 oz (71.9 kg)   BMI 24.82 kg/m  , BMI Body mass index is 24.82 kg/m.     GEN: Well nourished, well developed, in no acute distress. HEENT: normal. Neck: Supple, no JVD, carotid bruits, or masses. Cardiac: RRR, no murmurs, rubs, or gallops. No clubbing, cyanosis, edema.  Radials 2+/PT 2+ and equal bilaterally.  Respiratory:  Respirations regular and unlabored, clear to auscultation bilaterally. GI: Soft, nontender, nondistended, BS + x 4. MS: no deformity or atrophy. Skin: warm and dry, no rash. Neuro:  Strength and sensation are intact. Psych: Normal affect.  Accessory Clinical Findings    ECG personally reviewed by me today -regular sinus rhythm, 70, first-degree AV block, PACs, inferior infarct- no acute changes.  Lab Results  Component Value Date   WBC 6.4 09/25/2022   HGB 13.6 09/25/2022   HCT 39.2 09/25/2022   MCV 92.9 09/25/2022   PLT 165 09/25/2022   Lab Results  Component Value Date   CREATININE 0.82 09/24/2022   BUN 21 09/24/2022   NA 136 09/24/2022   K 4.3 09/24/2022   CL 103 09/24/2022   CO2 25 09/24/2022   Lab Results  Component Value Date   ALT 15 09/24/2022   AST 22 09/24/2022   ALKPHOS 77 09/24/2022   BILITOT 1.3 (H) 09/24/2022   Lab Results  Component Value Date   CHOL 112 12/16/2021   HDL 36.10 (L) 12/16/2021   LDLCALC 62 06/07/2017   LDLDIRECT 67.0 12/16/2021   TRIG 220.0 (H) 12/16/2021   CHOLHDL 3 12/16/2021    Lab Results  Component Value Date   HGBA1C 6.3 (H) 09/25/2022     Assessment & Plan    1.  Subdural hematoma/subarachnoid hemorrhage: Patient recently diagnosed with small subdural hematoma and subarachnoid hemorrhage in the setting of altered mental status and headaches in mid February.  Eliquis and Plavix were held but subsequently resumed March 5 in the setting of stable CT findings.  He has been doing well without headaches or recurrent alteration in mental status.  As he is greater than 6 months out since his PCI of the vein graft to the second diagonal, and has had recent head bleed, after discussion with Dr. Fletcher Anon, we have opted to discontinue Plavix.  He remains on Eliquis in the setting of paroxysmal atrial fibrillation and prior stroke.  2.  Coronary artery disease: Status post prior CABG with recent PCI and drug-eluting stent placement to the vein graft to the second diagonal in October 2023.  As above, Plavix was recently held in the setting of SAH/SDH, and was resumed March 5.  I discussed this case with Dr. Fletcher Anon today.  Given duration since PCI and recent head bleed, discontinuing Plavix today.  Symptomatically, patient has done reasonably well.  He occasionally notes focal left upper chest dull ache without associated symptoms, which is different from prior angina.  No acute findings on ECG today.  Will follow clinically.  He otherwise remains on calcium channel blocker and Zetia (statin intolerant).  3.  Paroxysmal atrial fibrillation: Maintaining sinus rhythm on Tikosyn therapy with stable QT.  Potassium 4.3 and magnesium of 2.2 in February, 2024.  4.  Hyperlipidemia: LDL of 67 in May 2023.  Statin intolerant but remains on  Zetia and fish oil.  5.  Sinus node dysfunction: Patient with history of syncope, sinus pauses, junctional escape beats, Mobitz 1, baseline bradycardia.  ECG stable today in sinus rhythm with first-degree AV block and PACs.  No recent syncope.  Previous evaluated with electrophysiology with recommendation for permanent pacing  however, patient declined.  6.  History of CVA: Presumed to be in the setting of atrial fibrillation.  Maintaining sinus rhythm on Tikosyn.  Anticoagulated with Eliquis.  7.  Type 2 diabetes mellitus: A1c 6.3 in February.  On metformin and ARB therapy.  8.  Disposition: Follow-up in 6 weeks or sooner if necessary.  Murray Hodgkins, NP 10/27/2022, 3:35 PM

## 2022-11-10 DIAGNOSIS — E119 Type 2 diabetes mellitus without complications: Secondary | ICD-10-CM | POA: Diagnosis not present

## 2022-11-10 DIAGNOSIS — H179 Unspecified corneal scar and opacity: Secondary | ICD-10-CM | POA: Diagnosis not present

## 2022-11-10 DIAGNOSIS — H40021 Open angle with borderline findings, high risk, right eye: Secondary | ICD-10-CM | POA: Diagnosis not present

## 2022-11-10 DIAGNOSIS — H2513 Age-related nuclear cataract, bilateral: Secondary | ICD-10-CM | POA: Diagnosis not present

## 2022-11-21 ENCOUNTER — Telehealth: Payer: Self-pay

## 2022-11-21 NOTE — Progress Notes (Signed)
Care Management & Coordination Services Pharmacy Team  Reason for Encounter: Hypertension  Contacted patient to discuss hypertension disease state.  Unsuccessful outreach. Left voicemail for patient to return call.   Current antihypertensive regimen:  Amlodipine 10 mg daily  Losartan 100 mg daily  Spironolactone 25 mg daily  What recent interventions/DTPs have been made by any provider to improve Blood Pressure control since last CPP Visit: Stop (provider): PLAVIX 75 MG tablet   Adherence Review: Is the patient currently on ACE/ARB medication? Yes Does the patient have >5 day gap between last estimated fill dates? No  Star Rating Drugs:  Medication:  Last Fill: Day Supply Losartan 100 mg 10/15/2022 90 Metformin 850 mg 09/08/2022 90  Chart Updates: Recent office visits:  None since last contact  Recent consult visits:  10/27/22 Nicolasa Ducking, NP (Cardiology) Coronary artery disease Ordered: EKG Stop (provider): PLAVIX 75 MG tablet   Hospital visits:  None since last contact  Medications: Outpatient Encounter Medications as of 11/21/2022  Medication Sig   acyclovir (ZOVIRAX) 800 MG tablet TAKE 1 TABLET TWICE A DAY   amLODipine (NORVASC) 10 MG tablet Take 1 tablet (10 mg total) by mouth daily.   apixaban (ELIQUIS) 5 MG TABS tablet Take 1 tablet (5 mg total) by mouth 2 (two) times daily.   ciclopirox (PENLAC) 8 % solution Apply topically.   COD LIVER OIL PO Take 1 capsule by mouth 2 (two) times daily. 10 mcg   dofetilide (TIKOSYN) 500 MCG capsule TAKE ONE CAPSULE BY MOUTH TWICE A DAY   ezetimibe (ZETIA) 10 MG tablet TAKE 1 TABLET DAILY   gabapentin (NEURONTIN) 600 MG tablet Take 0.5-1 tablets (300-600 mg total) by mouth 3 (three) times daily as needed.   isosorbide mononitrate (IMDUR) 30 MG 24 hr tablet Take 30 mg by mouth daily.   loratadine (CLARITIN) 10 MG tablet Take 1 tablet (10 mg total) by mouth daily.   losartan (COZAAR) 100 MG tablet Take 1 tablet (100 mg  total) by mouth daily.   metFORMIN (GLUCOPHAGE) 850 MG tablet Take 1 tablet (850 mg total) by mouth 2 (two) times daily with a meal. Resume 05/19/2022.   nitroGLYCERIN (NITROSTAT) 0.4 MG SL tablet Place 1 tablet (0.4 mg total) under the tongue every 5 (five) minutes as needed for chest pain (max 3 doses).   nystatin (MYCOSTATIN/NYSTOP) powder Apply 1 Application topically 2 (two) times daily. Use until resolved and then 2 extra days.   Omega-3 Fatty Acids (SALMON OIL PO) Take 1,000 mg by mouth. 1 twice daily   pantoprazole (PROTONIX) 40 MG tablet Take 1 tablet (40 mg total) by mouth daily.   predniSONE (DELTASONE) 20 MG tablet 2 tabs by mouth daily x 5 days, then 1 tabs by mouth daily x 5 days.   spironolactone (ALDACTONE) 25 MG tablet Take 1 tablet (25 mg total) by mouth daily.   tamsulosin (FLOMAX) 0.4 MG CAPS capsule Take 0.4 mg by mouth daily.   timolol (BETIMOL) 0.5 % ophthalmic solution Place 1 drop into both eyes 2 (two) times daily.   valACYclovir (VALTREX) 1000 MG tablet Take 1 tablet (1,000 mg total) by mouth 2 (two) times daily as needed.   No facility-administered encounter medications on file as of 11/21/2022.    Recent Office Vitals: BP Readings from Last 3 Encounters:  10/27/22 (!) 126/50  10/18/22 118/60  10/12/22 128/88   Pulse Readings from Last 3 Encounters:  10/27/22 70  10/18/22 64  10/12/22 (!) 57    Wt Readings from  Last 3 Encounters:  10/27/22 158 lb 8 oz (71.9 kg)  10/18/22 158 lb (71.7 kg)  10/12/22 159 lb (72.1 kg)     Kidney Function Lab Results  Component Value Date/Time   CREATININE 0.82 09/24/2022 09:17 AM   CREATININE 0.83 09/22/2022 11:05 AM   GFR 78.77 06/24/2022 12:42 PM   GFRNONAA >60 09/24/2022 09:17 AM   GFRAA >60 09/28/2017 10:00 AM       Latest Ref Rng & Units 09/24/2022    9:17 AM 09/22/2022   11:05 AM 07/13/2022    8:24 AM  BMP  Glucose 70 - 99 mg/dL 119  147  829   BUN 8 - 23 mg/dL Creatinine 0.61 - 1.24 mg/dL 5.62   1.30  8.65   Sodium 135 - 145 mmol/L 136  136  137   Potassium 3.5 - 5.1 mmol/L 4.3  4.5  4.3   Chloride 98 - 111 mmol/L 103  102  103   CO2 22 - 32 mmol/L Calcium 8.9 - 10.3 mg/dL 9.7  78.4  9.9     Al Corpus, PharmD notified  Claudina Lick, [place] Clinical Pharmacy Assistant [phone]

## 2022-11-24 ENCOUNTER — Ambulatory Visit: Payer: Medicare Other | Admitting: Nurse Practitioner

## 2022-11-24 ENCOUNTER — Other Ambulatory Visit: Payer: Self-pay | Admitting: Cardiovascular Disease

## 2022-11-24 ENCOUNTER — Other Ambulatory Visit: Payer: Self-pay | Admitting: Family Medicine

## 2022-11-25 ENCOUNTER — Telehealth: Payer: Self-pay | Admitting: Family Medicine

## 2022-11-25 MED ORDER — PREDNISONE 20 MG PO TABS: ORAL_TABLET | ORAL | 0 refills | Status: DC

## 2022-11-25 NOTE — Addendum Note (Signed)
Addended by: Joaquim Nam on: 11/25/2022 01:17 PM   Modules accepted: Orders

## 2022-11-25 NOTE — Telephone Encounter (Signed)
Called patients wife and reviewed all information. She verbalized understanding. They will update as needed regarding the referral

## 2022-11-25 NOTE — Telephone Encounter (Signed)
Spoke with patient and he stated that the infection is in his head and was seen on a CT scan. He said the medication helped clear it up before and would like more. He said he has runny nose with white liquid and fatigue. He states he has taken otc medications but none have helped. Patient stated that Dr. Para March knows about his infection from before.

## 2022-11-25 NOTE — Telephone Encounter (Signed)
Prescription Request  11/25/2022  LOV: 10/18/2022  What is the name of the medication or equipment?  predniSONE (DELTASONE) 20 MG tablet   Have you contacted your pharmacy to request a refill? No   Which pharmacy would you like this sent to?  [facility] - [place], [place] - [address] [place] [address] Phone: [phone] Fax: [phone]   Patient notified that their request is being sent to the clinical staff for review and that they should receive a response within 2 business days.   Please advise at Mobile [phone] (mobile)  Patient states that he needs another refill.The infection in his head has came back worst this time

## 2022-11-25 NOTE — Telephone Encounter (Signed)
Sent but if not better we need to get him eval'd by ENT.  Update [place] if needing referral.  Thanks.

## 2022-12-05 ENCOUNTER — Encounter: Payer: Medicare Other | Admitting: Pharmacist

## 2022-12-08 ENCOUNTER — Ambulatory Visit: Payer: Medicare Other | Attending: Nurse Practitioner | Admitting: Nurse Practitioner

## 2022-12-08 ENCOUNTER — Encounter: Payer: Self-pay | Admitting: Nurse Practitioner

## 2022-12-08 VITALS — BP 148/68 | HR 95 | Ht 67.0 in | Wt 160.4 lb

## 2022-12-08 DIAGNOSIS — E785 Hyperlipidemia, unspecified: Secondary | ICD-10-CM

## 2022-12-08 DIAGNOSIS — S065XAA Traumatic subdural hemorrhage with loss of consciousness status unknown, initial encounter: Secondary | ICD-10-CM

## 2022-12-08 DIAGNOSIS — I609 Nontraumatic subarachnoid hemorrhage, unspecified: Secondary | ICD-10-CM

## 2022-12-08 DIAGNOSIS — I495 Sick sinus syndrome: Secondary | ICD-10-CM

## 2022-12-08 DIAGNOSIS — I25810 Atherosclerosis of coronary artery bypass graft(s) without angina pectoris: Secondary | ICD-10-CM

## 2022-12-08 DIAGNOSIS — S065XAD Traumatic subdural hemorrhage with loss of consciousness status unknown, subsequent encounter: Secondary | ICD-10-CM

## 2022-12-08 DIAGNOSIS — I480 Paroxysmal atrial fibrillation: Secondary | ICD-10-CM | POA: Diagnosis not present

## 2022-12-08 DIAGNOSIS — E119 Type 2 diabetes mellitus without complications: Secondary | ICD-10-CM

## 2022-12-08 NOTE — Progress Notes (Signed)
Office Visit    Patient Name: [name] [name] Date of Encounter: 12/08/2022  Primary Care Provider:  Joaquim Nam, MD Primary Cardiologist:  Julien Nordmann, MD  Chief Complaint    82 year old male with a history of CAD status post CABG x 6 in 2015, type 2 diabetes mellitus, hypertension, hyperlipidemia, PFO, embolic CVA, and paroxysmal atrial fibrillation, who presents for follow-up CAD and AFib.  Past Medical History    Past Medical History:  Diagnosis Date   Anxiety    Back pain    Coronary artery disease 08/21/2013   a. 08/2013 s/p CABG x 6; b. 05/2022 PCI: LAD 162m, 60d, D2 50, LCX 80ost, 41m, 66m/d, OM1 100, RCA 100p, VG->OM2->OM3 nl, VG->D2 30ost, 99d @ Diag insertion (2.25x15 Onyx Frontier DES), VG->RCA nl, LIMA->LAD nl. EF 55-65%.   Diabetes mellitus    Diabetic neuropathy (HCC)    Diastolic dysfunction    a. 02/2017 Echo: EF 55-60%, no rwma, Gr1 DD, mildly dil Ao Root/RV. + PFO.   Diverticulosis    Diverticulosis    Embolic stroke (HCC)    a. 04/2017 MRI in setting of freq falls: small subacute infarction of bilat centrum semi-ovale consistent w/ embolic event-->Eliquis.   Erectile dysfunction    Family history of anesthesia complication    ' they cant wake my brother very easy"   Fatty liver    Flank pain    GERD (gastroesophageal reflux disease)    History of bronchitis    History of chicken pox    HSV infection    ocular symptoms and oral lesions   Hypercholesterolemia    Hypertension    Kidney stones    Osteoarthritis    PAF (paroxysmal atrial fibrillation) (HCC)    a. Had post-op AF 08/2013; b. 09/2015 Holter: PAC's no AF; c. 05/2017 Noted to be in AFib-->Eliquis (CHA2DS2VASc = 7); d. 08/2017 s/p DCCV-->ERAF-->Tikosyn added; e. 08/2020 Zio: 4% Afib burden.   PFO (patent foramen ovale)    a. 02/2017 Echo: + PFO.   Sinus node dysfunction (HCC)    a. 08/2020 Zio: Predominantly sinus bradycardia at 53 (35-211).  20 SVT runs.  1 run of nonsustained VT x4 beats.  4%  A-fib burden.  16 pauses lasting up to 3.6 seconds.  Intermittent junctional rhythm.  Patient declined pacemaker.   Past Surgical History:  Procedure Laterality Date   CARDIAC CATHETERIZATION  08/21/2013   DR Clifton James   CARDIOVERSION N/A 08/24/2017   Procedure: CARDIOVERSION;  Surgeon: Antonieta Iba, MD;  Location: [facility] ORS;  Service: Cardiovascular;  Laterality: N/A;   CHOLECYSTECTOMY  1983   CORONARY ARTERY BYPASS GRAFT N/A 08/23/2013   Procedure: CORONARY ARTERY BYPASS GRAFTING (CABG);  Surgeon: Delight Ovens, MD;  Location: [facility] OR;  Service: Open Heart Surgery;  Laterality: N/A;  CABG times six utilizing the left internal mammary artery and the right greater saphenous vein harvested endoscopically   CORONARY STENT INTERVENTION N/A 05/16/2022   Procedure: CORONARY STENT INTERVENTION;  Surgeon: Iran Ouch, MD;  Location: [facility] INVASIVE CV LAB;  Service: Cardiovascular;  Laterality: N/A;   ENDOVEIN HARVEST OF GREATER SAPHENOUS VEIN Right 08/23/2013   Procedure: ENDOVEIN HARVEST OF GREATER SAPHENOUS VEIN;  Surgeon: Delight Ovens, MD;  Location: [facility] OR;  Service: Open Heart Surgery;  Laterality: Right;   HERNIA REPAIR     X3   INTRAOPERATIVE TRANSESOPHAGEAL ECHOCARDIOGRAM N/A 08/23/2013   Procedure: INTRAOPERATIVE TRANSESOPHAGEAL ECHOCARDIOGRAM;  Surgeon: Delight Ovens, MD;  Location: [facility] OR;  Service: Open Heart Surgery;  Laterality: N/A;   LEFT HEART CATH AND CORS/GRAFTS ANGIOGRAPHY N/A 05/16/2022   Procedure: LEFT HEART CATH AND CORS/GRAFTS ANGIOGRAPHY;  Surgeon: Iran Ouch, MD;  Location: [facility] INVASIVE CV LAB;  Service: Cardiovascular;  Laterality: N/A;   LEFT HEART CATHETERIZATION WITH CORONARY ANGIOGRAM N/A 08/21/2013   Procedure: LEFT HEART CATHETERIZATION WITH CORONARY ANGIOGRAM;  Surgeon: Kathleene Hazel, MD;  Location: [facility] CATH LAB;  Service: Cardiovascular;  Laterality: N/A;    Allergies  Allergies  Allergen Reactions   Fenofibrate Other (See Comments)     Muscle weakness/pain   Fire Ant     Allergic reaction   Statins Other (See Comments)    MYALGIA, muscle pain    History of Present Illness    82 year old male with the above past medical history including CAD status post CABG x 6 in January 2015, type 2 diabetes mellitus, hypertension, hyperlipidemia, PFO, embolic CVA, and paroxysmal atrial fibrillation.  He underwent CABG x 6 in 2015 for postoperative atrial fibrillation, which subsequently resolved after a short course of amiodarone.  In 2017, he complained of palpitations and Holter monitoring showed sinus rhythm and PACs.  In the spring 2018, he was seen by neurology in the setting of multiple falls.  MRI of the brain in September 2019 showed small, subacute infarcts in the bilateral centrum semiovale consistent with prior embolic event.  It was suggested at that time that this was the result of paroxysmal atrial fibrillation and he was placed on Eliquis 5 mg twice daily.  In October 2018, he was seen by cardiology and found to be in atrial fibrillation.  He subsequently underwent cardioversion in January 2019 with early return of A-fib within 4 days.  Tikosyn was added.  In January 2022, he suffered a syncopal episode and underwent event monitoring which showed predominantly sinus rhythm with a 4% A-fib burden, episodic junctional rhythm, and 16 pauses lasting up to 3.6 seconds.  He was offered permanent pacemaker by Dr. Ladona Ridgel but patient declined.  In 05/2022, he was seen and reported a 2 to 25-month history of progressive exertional chest pain and dyspnea.  Diagnostic catheterization showed three-vessel CAD with patent grafts including LIMA to LAD, vein graft to the OM 2/OM 3, vein graft to the RCA, and vein graft to the second diagonal.  The vein graft to the second diagonal had severe disease at the anastomosis to the second diagonal, which was successfully treated with a drug-eluting stent.  He was initially placed on aspirin and Plavix  (patient was holding Eliquis at the time) but subsequently resumed Eliquis and aspirin was discontinued.  In February 2024, he initially experienced altered mental status with unremarkable ED workup and 2 days later, contacted our office with reports of headache and blurred vision.  He was sent back to the emergency room where a CT showed trace acute/subacute subarachnoid hemorrhage along the high right frontal lobe as well as trace subdural collections overlying the bilateral cerebral hemispheres.  Similar findings were noted on MRI.  He was seen by neurosurgery who recommended holding Eliquis and Plavix and otherwise conservative therapy.  Head CT February 18 showed no progression of subdural and subarachnoid hemorrhages and he was subsequently discharged home.  Follow-up imaging on March 1 was unchanged and he was cleared to resume Eliquis and Plavix on March 5 by neurosurgery.    Mr. Malinak was last seen in cardiology clinic on March 21, at which time he was doing well.  As he was  greater than 6 months out since PCI, we discontinued his Plavix.  Since then, he has felt well.  He remains active in and around his home.  He notes that he feels very blessed to be able to be so active despite his cardiac history.  He denies chest pain, palpitations, dyspnea, pnd, orthopnea, n, v, dizziness, syncope, edema, weight gain, or early satiety.  His blood pressure is elevated today and he notes that he has not been routinely checking at home.  Home Medications    Current Outpatient Medications  Medication Sig Dispense Refill   acyclovir (ZOVIRAX) 800 MG tablet TAKE 1 TABLET TWICE A DAY 180 tablet 3   amLODipine (NORVASC) 10 MG tablet Take 1 tablet (10 mg total) by mouth daily. 90 tablet 3   apixaban (ELIQUIS) 5 MG TABS tablet Take 1 tablet (5 mg total) by mouth 2 (two) times daily. 180 tablet 3   ciclopirox (PENLAC) 8 % solution Apply topically.     COD LIVER OIL PO Take 1 capsule by mouth 2 (two) times daily. 10  mcg     dofetilide (TIKOSYN) 500 MCG capsule TAKE ONE CAPSULE BY MOUTH TWICE A DAY 180 capsule 2   ezetimibe (ZETIA) 10 MG tablet TAKE 1 TABLET DAILY 90 tablet 0   gabapentin (NEURONTIN) 600 MG tablet Take 0.5-1 tablets (300-600 mg total) by mouth 3 (three) times daily as needed. 270 tablet 1   isosorbide mononitrate (IMDUR) 30 MG 24 hr tablet Take 30 mg by mouth daily.     loratadine (CLARITIN) 10 MG tablet Take 1 tablet (10 mg total) by mouth daily.     losartan (COZAAR) 100 MG tablet Take 1 tablet (100 mg total) by mouth daily. 90 tablet 2   metFORMIN (GLUCOPHAGE) 850 MG tablet Take 1 tablet (850 mg total) by mouth 2 (two) times daily with a meal. Resume 05/19/2022. 180 tablet 3   nitroGLYCERIN (NITROSTAT) 0.4 MG SL tablet Place 1 tablet (0.4 mg total) under the tongue every 5 (five) minutes as needed for chest pain (max 3 doses). 25 tablet 6   nystatin (MYCOSTATIN/NYSTOP) powder Apply 1 Application topically 2 (two) times daily. Use until resolved and then 2 extra days. 60 g 1   Omega-3 Fatty Acids (FISH OIL) 1000 MG CAPS Take 1 capsule by mouth 2 (two) times daily.     pantoprazole (PROTONIX) 40 MG tablet Take 1 tablet (40 mg total) by mouth daily. 90 tablet 3   predniSONE (DELTASONE) 20 MG tablet 2 tabs by mouth daily x 5 days, then 1 tabs by mouth daily x 5 days. 15 tablet 0   spironolactone (ALDACTONE) 25 MG tablet Take 1 tablet (25 mg total) by mouth daily. 90 tablet 3   tamsulosin (FLOMAX) 0.4 MG CAPS capsule Take 0.4 mg by mouth daily.     timolol (BETIMOL) 0.5 % ophthalmic solution Place 1 drop into both eyes 2 (two) times daily.     valACYclovir (VALTREX) 1000 MG tablet Take 1 tablet (1,000 mg total) by mouth 2 (two) times daily as needed. 60 tablet 1   Omega-3 Fatty Acids (SALMON OIL PO) Take 1,000 mg by mouth. 1 twice daily (Patient not taking: Reported on 12/08/2022)     No current facility-administered medications for this visit.     Review of Systems    He denies chest pain,  palpitations, dyspnea, pnd, orthopnea, n, v, dizziness, syncope, edema, weight gain, or early satiety.  He continues to deal with chronic sinusitis and nasal drainage.  All other systems reviewed and are otherwise negative except as noted above.    Physical Exam    VS:  BP (!) 148/68   Pulse 95   Ht 5\' 7"  (1.702 m)   Wt 160 lb 6.4 oz (72.8 kg)   SpO2 93%   BMI 25.12 kg/m  , BMI Body mass index is 25.12 kg/m.     Vitals:   12/08/22 0837 12/08/22 0912  BP: (!) 151/73 (!) 148/68  Pulse: 95   SpO2: 93%     GEN: Well nourished, well developed, in no acute distress. HEENT: normal. Neck: Supple, no JVD, carotid bruits, or masses. Cardiac: IR, IR, no murmurs, rubs, or gallops. No clubbing, cyanosis, edema.  Radials 2+/PT 2+ and equal bilaterally.  Respiratory:  Respirations regular and unlabored, clear to auscultation bilaterally. GI: Soft, nontender, nondistended, BS + x 4. MS: no deformity or atrophy. Skin: warm and dry, no rash. Neuro:  Strength and sensation are intact. Psych: Normal affect.  Accessory Clinical Findings    ECG personally reviewed by me today -atrial fibrillation, 87, inferior infarct- no acute changes.  Lab Results  Component Value Date   WBC 6.4 09/25/2022   HGB 13.6 09/25/2022   HCT 39.2 09/25/2022   MCV 92.9 09/25/2022   PLT 165 09/25/2022   Lab Results  Component Value Date   CREATININE 0.82 09/24/2022   BUN 21 09/24/2022   NA 136 09/24/2022   K 4.3 09/24/2022   CL 103 09/24/2022   CO2 25 09/24/2022   Lab Results  Component Value Date   ALT 15 09/24/2022   AST 22 09/24/2022   ALKPHOS 77 09/24/2022   BILITOT 1.3 (H) 09/24/2022   Lab Results  Component Value Date   CHOL 112 12/16/2021   HDL 36.10 (L) 12/16/2021   LDLCALC 62 06/07/2017   LDLDIRECT 67.0 12/16/2021   TRIG 220.0 (H) 12/16/2021   CHOLHDL 3 12/16/2021    Lab Results  Component Value Date   HGBA1C 6.3 (H) 09/25/2022    Assessment & Plan    1.  Coronary artery disease:  Status post prior CABG with PCI and drug-eluting stent placement to the vein graft-second diagonal in October 2023.  Now off of Plavix in the setting of subarachnoid hemorrhage/subdural hematoma earlier this year, and maintained on Eliquis only.  Doing well without chest pain or dyspnea.  Remains active.  Continue nitrate, Zetia, calcium channel blocker, and ARB.  No aspirin in the setting of Eliquis.  2.  Paroxysmal atrial fibrillation: He is in atrial fibrillation today.  Initially denied any symptoms today but says he sometimes notes palpitations at home.  2022 monitoring showed 4% A-fib burden and I suspect we are just seeing a paroxysm at this time.  His rate is controlled and he remains anticoagulated on Eliquis.  He is going to follow heart rates and blood pressures at home and send [place] trends in the next 1 to 2 weeks.  3.  Hyperlipidemia: LDL of 67 last year.  Statin intolerant but remains on Zetia.  4.  Subdural hematoma/subarachnoid hemorrhage: Diagnosed earlier this year with subsequent stable imaging in March.  As above, now off of Plavix and doing well.  He remains on Eliquis therapy in the setting of paroxysmal atrial fibrillation.  5.  Sinus node dysfunction: Patient with history of syncope, sinus pauses, junctional escape beats, Mobitz 1, and baseline bradycardia.  As above, in rate controlled A-fib today.  Previously evaluated by electrophysiology with recommendation for permanent pacing however,  patient has declined and has had no recent syncope.  6.  History of stroke: Presumed to be in the setting of atrial fibrillation.  Continue Eliquis.  7.  Type 2 diabetes mellitus: A1c 6.3 in February.  On metformin and ARB therapy.  No statin in the setting of intolerance but LDL at goal on Zetia.  8.  Disposition: Patient to contact us within the next 1 to 2 weeks with blood pressure and heart rate trends.  Offered earlier follow-up to reevaluate rhythm however, he prefers to follow-up in 3 to  4 months.   Nicolasa Ducking, NP 12/08/2022, 9:13 AM

## 2022-12-08 NOTE — Addendum Note (Signed)
Addended bySherri Rad C on: 12/08/2022 11:09 AM   Modules accepted: Orders

## 2022-12-08 NOTE — Patient Instructions (Signed)
Medication Instructions:  - Your physician recommends that you continue on your current medications as directed. Please refer to the Current Medication list given to you today.   Monitor your blood pressure & heart rate over the next 1-2 weeks and call the office with your numbers/ send a MyChart message  *If you need a refill on your cardiac medications before your next appointment, please call your pharmacy*   Lab Work: - none ordered  If you have labs (blood work) drawn today and your tests are completely normal, you will receive your results only by: MyChart Message (if you have MyChart) OR A paper copy in the mail If you have any lab test that is abnormal or we need to change your treatment, we will call you to review the results.   Testing/Procedures: - none ordered   Follow-Up: At [facility], you and your health needs are our priority.  As part of our continuing mission to provide you with exceptional heart care, we have created designated Provider Care Teams.  These Care Teams include your primary Cardiologist (physician) and Advanced Practice Providers (APPs -  Physician Assistants and Nurse Practitioners) who all work together to provide you with the care you need, when you need it.  We recommend signing up for the patient portal called "MyChart".  Sign up information is provided on this After Visit Summary.  MyChart is used to connect with patients for Virtual Visits (Telemedicine).  Patients are able to view lab/test results, encounter notes, upcoming appointments, etc.  Non-urgent messages can be sent to your provider as well.   To learn more about what you can do with MyChart, go to [email or web address].    Your next appointment:   3-4 month(s)  Provider:   You may see Julien Nordmann, MD or one of the following Advanced Practice Providers on your designated Care Team:   Nicolasa Ducking, NP   Other Instructions  How to Take Your Blood Pressure Blood  pressure is a measurement of how strongly your blood is pressing against the walls of your arteries. Arteries are blood vessels that carry blood from your heart throughout your body. Your health care provider takes your blood pressure at each office visit. You can also take your own blood pressure at home with a blood pressure monitor. You may need to take your own blood pressure to: Confirm a diagnosis of high blood pressure (hypertension). Monitor your blood pressure over time. Make sure your blood pressure medicine is working. Supplies needed: Blood pressure monitor. A chair to sit in. This should be a chair where you can sit upright with your back supported. Do not sit on a soft couch or an armchair. Table or desk. Small notebook and pencil or pen. How to prepare To get the most accurate reading, avoid the following for 30 minutes before you check your blood pressure: Drinking caffeine. Drinking alcohol. Eating. Smoking. Exercising. Five minutes before you check your blood pressure: Use the bathroom and urinate so that you have an empty bladder. Sit quietly in a chair. Do not talk. How to take your blood pressure To check your blood pressure, follow the instructions in the manual that came with your blood pressure monitor. If you have a digital blood pressure monitor, the instructions may be as follows: Sit up straight in a chair. Place your feet on the floor. Do not cross your ankles or legs. Rest your left arm at the level of your heart on a table or desk or  on the arm of a chair. Pull up your shirt sleeve. Wrap the blood pressure cuff around the upper part of your left arm, 1 inch (2.5 cm) above your elbow. It is best to wrap the cuff around bare skin. Fit the cuff snugly, but not too tightly, around your arm. You should be able to place only one finger between the cuff and your arm. Position the cord so that it rests in the bend of your elbow. Press the power button. Sit quietly  while the cuff inflates and deflates. Read the digital reading on the monitor screen and write the numbers down (record them) in a notebook. Wait 2-3 minutes, then repeat the steps, starting at step 1. What does my blood pressure reading mean? A blood pressure reading consists of a higher number over a lower number. Ideally, your blood pressure should be below 120/80. The first ("top") number is called the systolic pressure. It is a measure of the pressure in your arteries as your heart beats. The second ("bottom") number is called the diastolic pressure. It is a measure of the pressure in your arteries as the heart relaxes. Blood pressure is classified into four stages. The following are the stages for adults who do not have a short-term serious illness or a chronic condition. Systolic pressure and diastolic pressure are measured in a unit called mm Hg (millimeters of mercury).  Normal Systolic pressure: below 120. Diastolic pressure: below 80. Elevated Systolic pressure: 120-129. Diastolic pressure: below 80. Hypertension stage 1 Systolic pressure: 130-139. Diastolic pressure: 80-89. Hypertension stage 2 Systolic pressure: 140 or above. Diastolic pressure: 90 or above. You can have elevated blood pressure or hypertension even if only the systolic or only the diastolic number in your reading is higher than normal. Follow these instructions at home: Medicines Take over-the-counter and prescription medicines only as told by your health care provider. Tell your health care provider if you are having any side effects from blood pressure medicine. General instructions Check your blood pressure as often as recommended by your health care provider. Check your blood pressure at the same time every day. Take your monitor to the next appointment with your health care provider to make sure that: You are using it correctly. It provides accurate readings. Understand what your goal blood pressure  numbers are. Keep all follow-up visits. This is important. General tips Your health care provider can suggest a reliable monitor that will meet your needs. There are several types of home blood pressure monitors. Choose a monitor that has an arm cuff. Do not choose a monitor that measures your blood pressure from your wrist or finger. Choose a cuff that wraps snugly, not too tight or too loose, around your upper arm. You should be able to fit only one finger between your arm and the cuff. You can buy a blood pressure monitor at most drugstores or online. Where to find more information American Heart Association: www.heart.org Contact a health care provider if: Your blood pressure is consistently high. Your blood pressure is suddenly low. Get help right away if: Your systolic blood pressure is higher than 180. Your diastolic blood pressure is higher than 120. These symptoms may be an emergency. Get help right away. Call 911. Do not wait to see if the symptoms will go away. Do not drive yourself to the hospital. Summary Blood pressure is a measurement of how strongly your blood is pressing against the walls of your arteries. A blood pressure reading consists of a higher  number over a lower number. Ideally, your blood pressure should be below 120/80. Check your blood pressure at the same time every day. Avoid caffeine, alcohol, smoking, and exercise for 30 minutes prior to checking your blood pressure. These agents can affect the accuracy of the blood pressure reading. This information is not intended to replace advice given to you by your health care provider. Make sure you discuss any questions you have with your health care provider. Document Revised: 04/08/2021 Document Reviewed: 04/08/2021 Elsevier Patient Education  2023 [employer].

## 2022-12-09 ENCOUNTER — Telehealth: Payer: Self-pay | Admitting: Cardiovascular Disease

## 2022-12-09 NOTE — Telephone Encounter (Signed)
Spoke to pt and will call back for his 3 to 4 month

## 2022-12-15 ENCOUNTER — Telehealth: Payer: Self-pay

## 2022-12-15 NOTE — Telephone Encounter (Signed)
Dr. Tobi Bastos, patient came in to the office and stated that he wanted you to review his CT Scan done on 05/11/2022. He stated that his work-up with you was held because he had some heart issues that needed to be taken care of prior to scheduling his EGD and Colonoscopy. Patient came in today to let you know that he continues to have the abdominal pain for the past 3.5 years with no solution. Patient would like to know what you recommend. Please advise.

## 2022-12-18 NOTE — Telephone Encounter (Signed)
1. We can schedule procedures once his hear issues have resolved.  2. Is his constipation doing better with Linzess? Or is he not having good results- enquire dosage used

## 2022-12-20 ENCOUNTER — Telehealth: Payer: Self-pay

## 2022-12-20 NOTE — Telephone Encounter (Signed)
   [facility] Pre-operative Risk Assessment    Request for surgical clearance:  What type of surgery is being performed? EGD and Colonoscopy   When is this surgery scheduled? TBD   Are there any medications that need to be held prior to surgery and how long?   Eliquis (how many days to hold and when to restart)  Practice name and name of physician performing surgery? [facility]   Dr. Wyline Mood   What is your office phone and fax number? [phone]   and   [phone]   Anesthesia type (None, local, MAC, general) ? general   Adela Ports 12/20/2022, 3:12 PM  _________________________________________________________________   (provider comments below)

## 2022-12-20 NOTE — Telephone Encounter (Signed)
EGD and Colonoscopy to be done  on TBD.    [facility] Pre-operative Risk Assessment    Request for surgical clearance:  What type of surgery is being performed? EGD and colonoscopy   When is this surgery scheduled? TBD  Are there any medications that need to be held prior to surgery and how long?  Metformin and Eliquis   Practice name and name of physician performing surgery? [facility]    What is your office phone and fax number? [phone]   Anesthesia type (None, local, MAC, general) ? General   Breyden Jeudy 12/20/2022, 10:07 AM  _________________________________________________________________   (provider comments below)

## 2022-12-20 NOTE — Telephone Encounter (Signed)
Called patient to let him know that Dr. Tobi Bastos was okay for [place] to schedule him the EGD and Colonoscopy once we get something in writing from Dr. Windell Hummingbird office. Patient understood.

## 2022-12-21 ENCOUNTER — Telehealth: Payer: Self-pay | Admitting: Cardiovascular Disease

## 2022-12-21 DIAGNOSIS — D692 Other nonthrombocytopenic purpura: Secondary | ICD-10-CM | POA: Diagnosis not present

## 2022-12-21 DIAGNOSIS — L821 Other seborrheic keratosis: Secondary | ICD-10-CM | POA: Diagnosis not present

## 2022-12-21 DIAGNOSIS — D1801 Hemangioma of skin and subcutaneous tissue: Secondary | ICD-10-CM | POA: Diagnosis not present

## 2022-12-21 DIAGNOSIS — L814 Other melanin hyperpigmentation: Secondary | ICD-10-CM | POA: Diagnosis not present

## 2022-12-21 DIAGNOSIS — L570 Actinic keratosis: Secondary | ICD-10-CM | POA: Diagnosis not present

## 2022-12-21 NOTE — Telephone Encounter (Signed)
Duplicate request entered, anticoag rec being provided in other encounter.

## 2022-12-21 NOTE — Telephone Encounter (Signed)
   Patient Name: [name] [name]  DOB: 1940-09-22 MRN: [id number]  Primary Cardiologist: Julien Nordmann, MD  Chart reviewed as part of pre-operative protocol coverage. Given past medical history and time since last visit, based on ACC/AHA guidelines, [name] [name] is at acceptable risk for the planned procedure without further cardiovascular testing.   The patient was advised that if he develops new symptoms prior to surgery to contact our office to arrange for a follow-up visit, and he verbalized understanding.  While holding eliquis therapy, given his cardiac history and relatively recent stenting, if acceptable from GI standpoint, would recommend that he take Aspirin 81 mg daily only for the period that eliquis is on hold.  Once eliquis is resumed, he may stop aspirin.  Nicolasa Ducking, NP 12/21/2022, 10:23 AM

## 2022-12-21 NOTE — Telephone Encounter (Signed)
Patient with diagnosis of afib on Eliquis for anticoagulation.    Procedure: EGD and colonoscopy Date of procedure: TBD  CHA2DS2-VASc Score = 7  This indicates a 11.2% annual risk of stroke. The patient's score is based upon: CHF History: 0 HTN History: 1 Diabetes History: 1 Stroke History: 2 Vascular Disease History: 1 Age Score: 2 Gender Score: 0   Hx embolic CVA 2018, subarachnoid hemorrhage in 2023.  CrCl 13mL/min Platelet count 165K  Per office protocol, patient can hold Eliquis for 1 day prior to procedure. Resume as soon as safely possible after.   **This guidance is not considered finalized until pre-operative APP has relayed final recommendations.**

## 2022-12-21 NOTE — Telephone Encounter (Signed)
Patient was advised to call back with vitals.   05/03 - BP  135/70  HR 61 05/04  -BP  124/70  HR 86 05/05 - BP  160/71  HR 52 05/06 - BP  159/77  HR 57 05/07 - BP  130/86  HR 75 05/08 - BP  129/72  HR  85 05/09 - BP  139/86  HR  59  05/10 - BP  121/60  HR  66 05/11 - BP  121/67  HR  64

## 2022-12-21 NOTE — Telephone Encounter (Signed)
Veverly Fells, you saw this patient on 12/08/22. We have received request for risk evaluation for upcoming EGD/colonoscopy.  Would you please comment on cardiac risk for upcoming procedure?  Please forward your response to p cv div preop.  Thank you, Marcelino Duster

## 2022-12-21 NOTE — Telephone Encounter (Signed)
Overall trends look reasonable.  Keep up the good work.

## 2022-12-22 NOTE — Telephone Encounter (Signed)
Patient has been made aware and verbalized his understanding. 

## 2022-12-23 ENCOUNTER — Other Ambulatory Visit: Payer: Self-pay

## 2022-12-23 ENCOUNTER — Emergency Department
Admission: EM | Admit: 2022-12-23 | Discharge: 2022-12-23 | Disposition: A | Discharge status: Home / Self Care | Payer: Medicare Other | Attending: Emergency Medicine | Admitting: Emergency Medicine

## 2022-12-23 ENCOUNTER — Encounter: Payer: Self-pay | Admitting: Emergency Medicine

## 2022-12-23 DIAGNOSIS — R197 Diarrhea, unspecified: Secondary | ICD-10-CM | POA: Insufficient documentation

## 2022-12-23 DIAGNOSIS — I2510 Atherosclerotic heart disease of native coronary artery without angina pectoris: Secondary | ICD-10-CM | POA: Diagnosis not present

## 2022-12-23 DIAGNOSIS — R1030 Lower abdominal pain, unspecified: Secondary | ICD-10-CM | POA: Diagnosis not present

## 2022-12-23 DIAGNOSIS — R9431 Abnormal electrocardiogram [ECG] [EKG]: Secondary | ICD-10-CM | POA: Diagnosis not present

## 2022-12-23 DIAGNOSIS — I10 Essential (primary) hypertension: Secondary | ICD-10-CM | POA: Diagnosis not present

## 2022-12-23 DIAGNOSIS — E119 Type 2 diabetes mellitus without complications: Secondary | ICD-10-CM | POA: Diagnosis not present

## 2022-12-23 LAB — COMPREHENSIVE METABOLIC PANEL
ALT: 25 U/L (ref 0–44)
AST: 28 U/L (ref 15–41)
Albumin: 3.9 g/dL (ref 3.5–5.0)
Alkaline Phosphatase: 63 U/L (ref 38–126)
Anion gap: 7 (ref 5–15)
BUN: 17 mg/dL (ref 8–23)
CO2: 20 mmol/L — ABNORMAL LOW (ref 22–32)
Calcium: 8.9 mg/dL (ref 8.9–10.3)
Chloride: 108 mmol/L (ref 98–111)
Creatinine, Ser: 0.89 mg/dL (ref 0.61–1.24)
GFR, Estimated: >60 mL/min (ref >60)
Glucose, Bld: 176 mg/dL — ABNORMAL HIGH (ref 70–99)
Potassium: 3.7 mmol/L (ref 3.5–5.1)
Sodium: 135 mmol/L (ref 135–145)
Total Bilirubin: 1.9 mg/dL — ABNORMAL HIGH (ref 0.3–1.2)
Total Protein: 6.6 g/dL (ref 6.5–8.1)

## 2022-12-23 LAB — CBC WITH DIFFERENTIAL/PLATELET
Abs Immature Granulocytes: 0.02 10*3/uL (ref 0.00–0.07)
Basophils Absolute: 0 10*3/uL (ref 0.0–0.1)
Basophils Relative: 0 %
Eosinophils Absolute: 0 10*3/uL (ref 0.0–0.5)
Eosinophils Relative: 1 %
HCT: 43.9 % (ref 39.0–52.0)
Hemoglobin: 15.3 g/dL (ref 13.0–17.0)
Immature Granulocytes: 0 %
Lymphocytes Relative: 30 %
Lymphs Abs: 1.6 10*3/uL (ref 0.7–4.0)
MCH: 32.5 pg (ref 26.0–34.0)
MCHC: 34.9 g/dL (ref 30.0–36.0)
MCV: 93.2 fL (ref 80.0–100.0)
Monocytes Absolute: 0.4 10*3/uL (ref 0.1–1.0)
Monocytes Relative: 8 %
Neutro Abs: 3.2 10*3/uL (ref 1.7–7.7)
Neutrophils Relative %: 61 %
Platelets: 149 10*3/uL — ABNORMAL LOW (ref 150–400)
RBC: 4.71 MIL/uL (ref 4.22–5.81)
RDW: 12.5 % (ref 11.5–15.5)
WBC: 5.3 10*3/uL (ref 4.0–10.5)
nRBC: 0 % (ref 0.0–0.2)

## 2022-12-23 LAB — LIPASE, BLOOD: Lipase: 33 U/L (ref 11–51)

## 2022-12-23 MED ORDER — LOPERAMIDE HCL 2 MG PO CAPS: 2.0000 mg | ORAL_CAPSULE | ORAL | 0 refills | Status: DC | PRN

## 2022-12-23 MED ORDER — AZITHROMYCIN 500 MG PO TABS: 500.0000 mg | ORAL_TABLET | Freq: Every day | ORAL | 0 refills | Status: AC

## 2022-12-23 NOTE — ED Notes (Signed)
PT has been taking pepto bismal.

## 2022-12-23 NOTE — Discharge Instructions (Addendum)
Please take this antibiotic (azithromycin) once a day for the next 3 days

## 2022-12-23 NOTE — ED Triage Notes (Signed)
Patient ambulatory to triage with steady gait, without difficulty or distress noted; pt reports for the last 2wks having black watery diarrhea; denies vomiting, denies abd pain, denies fever; no recent use of antibiotics; no hx of same

## 2022-12-23 NOTE — ED Provider Notes (Signed)
[facility] Provider Note   Event Date/Time   First MD Initiated Contact with Patient 12/23/22 [phone]     (approximate) History  Diarrhea  HPI [name] [name] is a 82 y.o. male w/ a PMHx of DM2, HTN, persistent Afib, and CAD who presents for 1 week of diarrhea. Pt states it was originally brown when it started but then turned black after pepto bismol use. Pt endorses lower abd cramping pain that is partially resolved with a bowel movement. Pt states this diarrhea is exacerbated by PO intake and has no relieving factors.  ROS: Patient currently denies any vision changes, tinnitus, difficulty speaking, facial droop, sore throat, chest pain, shortness of breath,  nausea/vomiting, dysuria, or weakness/numbness/paresthesias in any extremity   Physical Exam  Triage Vital Signs: ED Triage Vitals  Enc Vitals Group     BP 12/23/22 0624 (!) 118/43     Pulse Rate 12/23/22 0624 84     Resp 12/23/22 0624 18     Temp 12/23/22 0624 98.1 F (36.7 C)     Temp Source 12/23/22 0624 Oral     SpO2 12/23/22 0624 97 %     Weight 12/23/22 0623 165 lb (74.8 kg)     Height 12/23/22 0623 5\' 7"  (1.702 m)     Head Circumference --      Peak Flow --      Pain Score 12/23/22 0623 0     Pain Loc --      Pain Edu? --      Excl. in GC? --    Most recent vital signs: Vitals:   12/23/22 0624  BP: (!) 118/43  Pulse: 84  Resp: 18  Temp: 98.1 F (36.7 C)  SpO2: 97%   General: Awake, oriented x4. CV:  Good peripheral perfusion.  Resp:  Normal effort.  Abd:  No distention.  Other:  Elderly well developed well nourished caucasian male laying in bed in NAD ED Results / Procedures / Treatments  Labs (all labs ordered are listed, but only abnormal results are displayed) Labs Reviewed  CBC WITH DIFFERENTIAL/PLATELET - Abnormal; Notable for the following components:      Result Value   Platelets 149 (*)    All other components within normal limits  COMPREHENSIVE METABOLIC PANEL -  Abnormal; Notable for the following components:   CO2 20 (*)    Glucose, Bld 176 (*)    Total Bilirubin 1.9 (*)    All other components within normal limits  LIPASE, BLOOD  URINALYSIS, ROUTINE W REFLEX MICROSCOPIC  TYPE AND SCREEN   EKG ED ECG REPORT I, Merwyn Katos, the attending physician, personally viewed and interpreted this ECG. Date: 12/23/2022 EKG Time: 0635 Rate: 71 Rhythm: Atrial fibrillation QRS Axis: normal Intervals: normal ST/T Wave abnormalities: normal Narrative Interpretation: Atrial fibrillation.  No evidence of acute ischemia PROCEDURES: Critical Care performed: No Procedures MEDICATIONS ORDERED IN ED: Medications - No data to display IMPRESSION / MDM / ASSESSMENT AND PLAN / ED COURSE  I reviewed the triage vital signs and the nursing notes.                             The patient is on the cardiac monitor to evaluate for evidence of arrhythmia and/or significant heart rate changes. Patient's presentation is most consistent with acute presentation with potential threat to life or bodily function. This patient presents with diarrhea consistent with likely infectious diarrhea.  Given the length of symptoms, concern for acute bacterial diarrhea. Considered, but think unlikely, partial SBO, appendicitis, diverticulitis, other intraabdominal infection. Low suspicion for secondary causes of diarrhea such as hyperadrenergic state, pheo, adrenal crisis, hyperthyroidism, or sepsis. Doubt antibiotic associated diarrhea.  Plan: PO rehydration, reassess, discharge with OTC antidiarrheal meds//short course antibiotics  Dispo: Discharge home with PCP follow-up and strict return precautions   FINAL CLINICAL IMPRESSION(S) / ED DIAGNOSES   Final diagnoses:  Diarrhea of presumed infectious origin   Rx / DC Orders   ED Discharge Orders          Ordered    loperamide (IMODIUM) 2 MG capsule  As needed        12/23/22 0719    azithromycin (ZITHROMAX) 500 MG tablet   Daily        12/23/22 0719           Note:  This document was prepared using Dragon voice recognition software and may include unintentional dictation errors.   Merwyn Katos, MD 12/23/22 [phone]

## 2022-12-26 ENCOUNTER — Other Ambulatory Visit: Payer: Self-pay

## 2022-12-26 DIAGNOSIS — K5900 Constipation, unspecified: Secondary | ICD-10-CM

## 2022-12-26 DIAGNOSIS — R634 Abnormal weight loss: Secondary | ICD-10-CM

## 2022-12-26 DIAGNOSIS — R1010 Upper abdominal pain, unspecified: Secondary | ICD-10-CM

## 2022-12-26 MED ORDER — NA SULFATE-K SULFATE-MG SULF 17.5-3.13-1.6 GM/177ML PO SOLN: 354.0000 mL | Freq: Once | ORAL | 0 refills | Status: AC

## 2022-12-28 ENCOUNTER — Encounter: Payer: Self-pay | Admitting: Gastroenterology

## 2022-12-29 ENCOUNTER — Telehealth: Payer: Self-pay

## 2022-12-29 NOTE — Telephone Encounter (Signed)
Transition Care Management Follow-up Telephone Call Date of discharge and from where: 12/23/2022 [facility] How have you been since you were released from the hospital? Patient is feeling better Any questions or concerns? No  Items Reviewed: Did the pt receive and understand the discharge instructions provided? Yes  Medications obtained and verified? Yes  Other? No  Any new allergies since your discharge? No  Dietary orders reviewed? Yes Do you have support at home? Yes   Follow up appointments reviewed:  PCP Hospital f/u appt confirmed? No  Scheduled to see  on  @ . Specialist Hospital f/u appt confirmed? Yes  Scheduled to see Wyline Mood MD on 01/04/2023 @ [facility]. Are transportation arrangements needed? No  If their condition worsens, is the pt aware to call PCP or go to the Emergency Dept.? Yes Was the patient provided with contact information for the PCP's office or ED? Yes Was to pt encouraged to call back with questions or concerns? Yes  Darlette Dubow [facility]  [facility] Population Health Community Resource Care Guide   [email or web address]  ?? [phone]   Website: [email or web address]

## 2022-12-29 NOTE — Telephone Encounter (Signed)
Patient is scheduled for his procedure on 01/04/2023. Dr. Windell Hummingbird approval is in patient's chart.

## 2023-01-03 ENCOUNTER — Encounter: Payer: Self-pay | Admitting: Gastroenterology

## 2023-01-04 ENCOUNTER — Ambulatory Visit: Payer: Medicare Other | Admitting: Anesthesiology

## 2023-01-04 ENCOUNTER — Encounter: Payer: Self-pay | Admitting: Gastroenterology

## 2023-01-04 ENCOUNTER — Encounter
Admission: RE | Disposition: A | Discharge status: Home / Self Care | Payer: Self-pay | Source: Home / Self Care | Attending: Gastroenterology

## 2023-01-04 ENCOUNTER — Other Ambulatory Visit: Payer: Self-pay

## 2023-01-04 ENCOUNTER — Ambulatory Visit
Admission: RE | Admit: 2023-01-04 | Discharge: 2023-01-04 | Disposition: A | Discharge status: Home / Self Care | Payer: Medicare Other | Attending: Gastroenterology | Admitting: Gastroenterology

## 2023-01-04 DIAGNOSIS — K5730 Diverticulosis of large intestine without perforation or abscess without bleeding: Secondary | ICD-10-CM | POA: Diagnosis not present

## 2023-01-04 DIAGNOSIS — D120 Benign neoplasm of cecum: Secondary | ICD-10-CM | POA: Insufficient documentation

## 2023-01-04 DIAGNOSIS — Z7984 Long term (current) use of oral hypoglycemic drugs: Secondary | ICD-10-CM | POA: Insufficient documentation

## 2023-01-04 DIAGNOSIS — Z7901 Long term (current) use of anticoagulants: Secondary | ICD-10-CM | POA: Diagnosis not present

## 2023-01-04 DIAGNOSIS — Z800 Family history of malignant neoplasm of digestive organs: Secondary | ICD-10-CM | POA: Insufficient documentation

## 2023-01-04 DIAGNOSIS — K635 Polyp of colon: Secondary | ICD-10-CM | POA: Diagnosis not present

## 2023-01-04 DIAGNOSIS — Z79899 Other long term (current) drug therapy: Secondary | ICD-10-CM | POA: Diagnosis not present

## 2023-01-04 DIAGNOSIS — Z951 Presence of aortocoronary bypass graft: Secondary | ICD-10-CM | POA: Diagnosis not present

## 2023-01-04 DIAGNOSIS — D126 Benign neoplasm of colon, unspecified: Secondary | ICD-10-CM | POA: Diagnosis not present

## 2023-01-04 DIAGNOSIS — K5900 Constipation, unspecified: Secondary | ICD-10-CM

## 2023-01-04 DIAGNOSIS — E1140 Type 2 diabetes mellitus with diabetic neuropathy, unspecified: Secondary | ICD-10-CM | POA: Insufficient documentation

## 2023-01-04 DIAGNOSIS — R1010 Upper abdominal pain, unspecified: Secondary | ICD-10-CM

## 2023-01-04 DIAGNOSIS — E7800 Pure hypercholesterolemia, unspecified: Secondary | ICD-10-CM | POA: Diagnosis not present

## 2023-01-04 DIAGNOSIS — Z955 Presence of coronary angioplasty implant and graft: Secondary | ICD-10-CM | POA: Insufficient documentation

## 2023-01-04 DIAGNOSIS — R634 Abnormal weight loss: Secondary | ICD-10-CM

## 2023-01-04 DIAGNOSIS — K5790 Diverticulosis of intestine, part unspecified, without perforation or abscess without bleeding: Secondary | ICD-10-CM | POA: Diagnosis not present

## 2023-01-04 DIAGNOSIS — I2510 Atherosclerotic heart disease of native coronary artery without angina pectoris: Secondary | ICD-10-CM | POA: Diagnosis not present

## 2023-01-04 DIAGNOSIS — K760 Fatty (change of) liver, not elsewhere classified: Secondary | ICD-10-CM | POA: Diagnosis not present

## 2023-01-04 DIAGNOSIS — I480 Paroxysmal atrial fibrillation: Secondary | ICD-10-CM | POA: Diagnosis not present

## 2023-01-04 DIAGNOSIS — K219 Gastro-esophageal reflux disease without esophagitis: Secondary | ICD-10-CM | POA: Insufficient documentation

## 2023-01-04 DIAGNOSIS — D127 Benign neoplasm of rectosigmoid junction: Secondary | ICD-10-CM | POA: Diagnosis not present

## 2023-01-04 DIAGNOSIS — I10 Essential (primary) hypertension: Secondary | ICD-10-CM | POA: Insufficient documentation

## 2023-01-04 HISTORY — PX: COLONOSCOPY WITH PROPOFOL: SHX5780

## 2023-01-04 HISTORY — PX: ESOPHAGOGASTRODUODENOSCOPY (EGD) WITH PROPOFOL: SHX5813

## 2023-01-04 LAB — GLUCOSE, CAPILLARY: Glucose-Capillary: 120 mg/dL — ABNORMAL HIGH (ref 70–99)

## 2023-01-04 SURGERY — COLONOSCOPY WITH PROPOFOL
Anesthesia: General

## 2023-01-04 MED ORDER — STERILE WATER FOR IRRIGATION IR SOLN
Status: DC | PRN
  Administered 2023-01-04: 60 mL

## 2023-01-04 MED ORDER — LIDOCAINE HCL (CARDIAC) PF 100 MG/5ML IV SOSY
PREFILLED_SYRINGE | INTRAVENOUS | Status: DC | PRN
  Administered 2023-01-04: 100 mg via INTRAVENOUS

## 2023-01-04 MED ORDER — GLYCOPYRROLATE 0.2 MG/ML IJ SOLN
INTRAMUSCULAR | Status: DC | PRN
  Administered 2023-01-04: .2 mg via INTRAVENOUS

## 2023-01-04 MED ORDER — SODIUM CHLORIDE 0.9 % IV SOLN: INTRAVENOUS | Status: DC

## 2023-01-04 MED ORDER — PROPOFOL 10 MG/ML IV BOLUS
INTRAVENOUS | Status: DC | PRN
  Administered 2023-01-04: 50 mg via INTRAVENOUS
  Administered 2023-01-04: 150 ug/kg/min via INTRAVENOUS

## 2023-01-04 NOTE — H&P (Signed)
Wyline Mood, MD [address], [address], [place], [place], [address] [address], [place], [place], [address] Phone: [phone]  Fax: [phone]  Primary Care Physician:  Joaquim Nam, MD   Pre-Procedure History & Physical: HPI:  [name] [name] is a 82 y.o. male is here for an endoscopy and colonoscopy    Past Medical History:  Diagnosis Date   Anxiety    Back pain    Coronary artery disease 08/21/2013   a. 08/2013 s/p CABG x 6; b. 05/2022 PCI: LAD 117m, 60d, D2 50, LCX 80ost, 87m, 64m/d, OM1 100, RCA 100p, VG->OM2->OM3 nl, VG->D2 30ost, 99d @ Diag insertion (2.25x15 Onyx Frontier DES), VG->RCA nl, LIMA->LAD nl. EF 55-65%.   Diabetes mellitus    Diabetic neuropathy (HCC)    Diastolic dysfunction    a. 02/2017 Echo: EF 55-60%, no rwma, Gr1 DD, mildly dil Ao Root/RV. + PFO.   Diverticulosis    Diverticulosis    Embolic stroke (HCC)    a. 04/2017 MRI in setting of freq falls: small subacute infarction of bilat centrum semi-ovale consistent w/ embolic event-->Eliquis.   Erectile dysfunction    Family history of anesthesia complication    ' they cant wake my brother very easy"   Fatty liver    Flank pain    GERD (gastroesophageal reflux disease)    History of bronchitis    History of chicken pox    HSV infection    ocular symptoms and oral lesions   Hypercholesterolemia    Hypertension    Kidney stones    Osteoarthritis    PAF (paroxysmal atrial fibrillation) (HCC)    a. Had post-op AF 08/2013; b. 09/2015 Holter: PAC's no AF; c. 05/2017 Noted to be in AFib-->Eliquis (CHA2DS2VASc = 7); d. 08/2017 s/p DCCV-->ERAF-->Tikosyn added; e. 08/2020 Zio: 4% Afib burden.   PFO (patent foramen ovale)    a. 02/2017 Echo: + PFO.   Sinus node dysfunction (HCC)    a. 08/2020 Zio: Predominantly sinus bradycardia at 53 (35-211).  20 SVT runs.  1 run of nonsustained VT x4 beats.  4% A-fib burden.  16 pauses lasting up to 3.6 seconds.  Intermittent junctional rhythm.  Patient  declined pacemaker.    Past Surgical History:  Procedure Laterality Date   CARDIAC CATHETERIZATION  08/21/2013   DR Clifton James   CARDIOVERSION N/A 08/24/2017   Procedure: CARDIOVERSION;  Surgeon: Antonieta Iba, MD;  Location: [facility] ORS;  Service: Cardiovascular;  Laterality: N/A;   CHOLECYSTECTOMY  1983   CORONARY ARTERY BYPASS GRAFT N/A 08/23/2013   Procedure: CORONARY ARTERY BYPASS GRAFTING (CABG);  Surgeon: Delight Ovens, MD;  Location[facility] OR;  Service: Open Heart Surgery;  Laterality: N/A;  CABG times six utilizing the left internal mammary artery and the right greater saphenous vein harvested endoscopically   CORONARY STENT INTERVENTION N/A 05/16/2022   Procedure: CORONARY STENT INTERVENTION;  Surgeon: Iran Ouch, MD;  Location: [facility] INVASIVE CV LAB;  Service: Cardiovascular;  Laterality: N/A;   ENDOVEIN HARVEST OF GREATER SAPHENOUS VEIN Right 08/23/2013   Procedure: ENDOVEIN HARVEST OF GREATER SAPHENOUS VEIN;  Surgeon: Delight Ovens, MD;  Location: [facility] OR;  Service: Open Heart Surgery;  Laterality: Right;   HERNIA REPAIR     X3   INTRAOPERATIVE TRANSESOPHAGEAL ECHOCARDIOGRAM N/A 08/23/2013   Procedure: INTRAOPERATIVE TRANSESOPHAGEAL ECHOCARDIOGRAM;  Surgeon: Delight Ovens, MD;  Location: [facility] OR;  Service: Open Heart Surgery;  Laterality: N/A;   LEFT HEART CATH AND CORS/GRAFTS  ANGIOGRAPHY N/A 05/16/2022   Procedure: LEFT HEART CATH AND CORS/GRAFTS ANGIOGRAPHY;  Surgeon: Iran Ouch, MD;  Location: [facility] INVASIVE CV LAB;  Service: Cardiovascular;  Laterality: N/A;   LEFT HEART CATHETERIZATION WITH CORONARY ANGIOGRAM N/A 08/21/2013   Procedure: LEFT HEART CATHETERIZATION WITH CORONARY ANGIOGRAM;  Surgeon: Kathleene Hazel, MD;  Location: [facility] CATH LAB;  Service: Cardiovascular;  Laterality: N/A;    Prior to Admission medications   Medication Sig Start Date End Date Taking? Authorizing Provider  amLODipine (NORVASC) 10 MG tablet Take 1 tablet (10 mg total) by mouth  daily. 05/12/22  Yes Creig Hines, NP  apixaban (ELIQUIS) 5 MG TABS tablet Take 1 tablet (5 mg total) by mouth 2 (two) times daily. 10/18/22  Yes Joaquim Nam, MD  ciclopirox [facility]) 8 % solution Apply topically. 09/07/22  Yes [provider]  COD LIVER OIL PO Take 1 capsule by mouth 2 (two) times daily. 10 mcg   Yes [provider]  dofetilide (TIKOSYN) 500 MCG capsule TAKE ONE CAPSULE BY MOUTH TWICE A DAY 05/16/22  Yes Gollan, Tollie Pizza, MD  ezetimibe (ZETIA) 10 MG tablet TAKE 1 TABLET DAILY 11/25/22  Yes Gollan, Tollie Pizza, MD  gabapentin (NEURONTIN) 600 MG tablet Take 0.5-1 tablets (300-600 mg total) by mouth 3 (three) times daily as needed. 07/07/20  Yes Joaquim Nam, MD  isosorbide mononitrate (IMDUR) 30 MG 24 hr tablet Take 30 mg by mouth daily. 10/22/22  Yes [provider]  loperamide (IMODIUM) 2 MG capsule Take 1 capsule (2 mg total) by mouth as needed for diarrhea or loose stools (Up to 4 doses in 24hrs). 12/23/22  Yes Merwyn Katos, MD  loratadine (CLARITIN) 10 MG tablet Take 1 tablet (10 mg total) by mouth daily. 10/28/18  Yes Joaquim Nam, MD  losartan (COZAAR) 100 MG tablet Take 1 tablet (100 mg total) by mouth daily. 05/03/22  Yes Marinus Maw, MD  metFORMIN (GLUCOPHAGE) 850 MG tablet Take 1 tablet (850 mg total) by mouth 2 (two) times daily with a meal. Resume 05/19/2022. 05/17/22  Yes Dunn, Raymon Mutton, PA-C  nitroGLYCERIN (NITROSTAT) 0.4 MG SL tablet Place 1 tablet (0.4 mg total) under the tongue every 5 (five) minutes as needed for chest pain (max 3 doses). 05/12/22  Yes Creig Hines, NP  nystatin (MYCOSTATIN/NYSTOP) powder Apply 1 Application topically 2 (two) times daily. Use until resolved and then 2 extra days. 10/18/22  Yes Joaquim Nam, MD  Omega-3 Fatty Acids (FISH OIL) 1000 MG CAPS Take 1 capsule by mouth 2 (two) times daily.   Yes [provider]  pantoprazole (PROTONIX) 40 MG tablet Take 1 tablet (40 mg  total) by mouth daily. 05/23/22  Yes Dunn, Raymon Mutton, PA-C  predniSONE (DELTASONE) 20 MG tablet 2 tabs by mouth daily x 5 days, then 1 tabs by mouth daily x 5 days. 11/25/22  Yes Joaquim Nam, MD  tamsulosin (FLOMAX) 0.4 MG CAPS capsule Take 0.4 mg by mouth daily. 01/21/22  Yes [provider]  timolol (BETIMOL) 0.5 % ophthalmic solution Place 1 drop into both eyes 2 (two) times daily.   Yes [provider]  valACYclovir (VALTREX) 1000 MG tablet Take 1 tablet (1,000 mg total) by mouth 2 (two) times daily as needed. 09/23/20  Yes Joaquim Nam, MD  acyclovir (ZOVIRAX) 800 MG tablet TAKE 1 TABLET TWICE A DAY 06/14/22   Joaquim Nam, MD  Omega-3 Fatty Acids (SALMON OIL PO) Take 1,000 mg  by mouth. 1 twice daily Patient not taking: Reported on 12/08/2022    [provider]  spironolactone (ALDACTONE) 25 MG tablet Take 1 tablet (25 mg total) by mouth daily. 07/06/22 12/08/22  Creig Hines, NP    Allergies as of 12/26/2022 - Review Complete 12/23/2022  Allergen Reaction Noted   Fenofibrate Other (See Comments) 03/22/2017   Fire ant  11/26/2019   Statins Other (See Comments) 12/08/2011    Family History  Problem Relation Age of Onset   Stroke Mother    Diabetes Mother    Heart disease Father        heart failure   Colon cancer Maternal Grandmother    Parkinsonism Maternal Grandfather    Stroke Paternal Grandfather    Prostate cancer Maternal Uncle    CAD Brother        CABG    Social History   Socioeconomic History   Marital status: Married    Spouse name: Not on file   Number of children: 2   Years of education: HS   Highest education level: Not on file  Occupational History   Occupation: Retired  Tobacco Use   Smoking status: Never   Smokeless tobacco: Never  Vaping Use   Vaping Use: Never used  Substance and Sexual Activity   Alcohol use: No    Alcohol/week: 0.0 standard drinks of alcohol   Drug use: No   Sexual activity: Not on  file  Other Topics Concern   Not on file  Social History Narrative   Retired, remarried 1988   From [place], [place]   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   Right-handed.   2 cups caffeine daily   Social Determinants of Health   Financial Resource Strain: Low Risk  (12/14/2021)   Overall Financial Resource Strain (CARDIA)    Difficulty of Paying Living Expenses: Not hard at all  Food Insecurity: No Food Insecurity (09/24/2022)   Hunger Vital Sign    Worried About Running Out of Food in the Last Year: Never true    Ran Out of Food in the Last Year: Never true  Transportation Needs: No Transportation Needs (09/24/2022)   PRAPARE - Administrator, Civil Service (Medical): No    Lack of Transportation (Non-Medical): No  Physical Activity: Sufficiently Active (12/14/2021)   Exercise Vital Sign    Days of Exercise per Week: 5 days    Minutes of Exercise per Session: 60 min  Stress: No Stress Concern Present (12/14/2021)   [employer] of Occupational Health - Occupational Stress Questionnaire    Feeling of Stress : Not at all  Social Connections: Moderately Integrated (12/14/2021)   Social Connection and Isolation Panel [NHANES]    Frequency of Communication with Friends and Family: Three times a week    Frequency of Social Gatherings with Friends and Family: Twice a week    Attends Religious Services: Never    Database administrator or Organizations: Yes    Attends Engineer, structural: More than 4 times per year    Marital Status: Married  Catering manager Violence: Not At Risk (09/24/2022)   Humiliation, Afraid, Rape, and Kick questionnaire    Fear of Current or Ex-Partner: No    Emotionally Abused: No    Physically Abused: No    Sexually Abused: No    Review of Systems: See HPI, otherwise negative ROS  Physical Exam: BP 138/66  Pulse (!) 51   Temp (!) 97.1 F (36.2 C)   Resp 18   Ht 5\' 8"  (1.727 m)    Wt 70.8 kg   SpO2 99%   BMI 23.72 kg/m  General:   Alert,  pleasant and cooperative in NAD Head:  Normocephalic and atraumatic. Neck:  Supple; no masses or thyromegaly. Lungs:  Clear throughout to auscultation, normal respiratory effort.    Heart:  +S1, +S2, Regular rate and rhythm, No edema. Abdomen:  Soft, nontender and nondistended. Normal bowel sounds, without guarding, and without rebound.   Neurologic:  Alert and  oriented x4;  grossly normal neurologically.  Impression/Plan: [name] [name] is here for an endoscopy and colonoscopy  to be performed for  evaluation of weight loss    Risks, benefits, limitations, and alternatives regarding endoscopy have been reviewed with the patient.  Questions have been answered.  All parties agreeable.   Wyline Mood, MD  01/04/2023, 12:38 PM

## 2023-01-04 NOTE — Op Note (Signed)
[facility] Gastroenterology Patient Name: [name] [name] Procedure Date: 01/04/2023 1:14 PM MRN: [id number] Account #: [id number] Date of Birth: Apr 06, 1941 Admit Type: Outpatient Age: 82 Room: [facility] ENDO ROOM 3 Gender: Male Note Status: Finalized Instrument Name: Laurette Schimke [id number] Procedure:             Upper GI endoscopy Indications:           Weight loss Providers:             Wyline Mood MD, MD Referring MD:          Wyline Mood MD, MD (Referring MD) Medicines:             Monitored Anesthesia Care Complications:         No immediate complications. Procedure:             Pre-Anesthesia Assessment:                        - Prior to the procedure, a History and Physical was                         performed, and patient medications, allergies and                         sensitivities were reviewed. The patient's tolerance                         of previous anesthesia was reviewed.                        - The risks and benefits of the procedure and the                         sedation options and risks were discussed with the                         patient. All questions were answered and informed                         consent was obtained.                        - ASA Grade Assessment: II - A patient with mild                         systemic disease.                        After obtaining informed consent, the endoscope was                         passed under direct vision. Throughout the procedure,                         the patient's blood pressure, pulse, and oxygen                         saturations were monitored continuously. The Endoscope                         was introduced  through the mouth, and advanced to the                         third part of duodenum. The upper GI endoscopy was                         accomplished with ease. The patient tolerated the                         procedure well. Findings:      The esophagus was normal.       The stomach was normal.      The examined duodenum was normal. Impression:            - Normal esophagus.                        - Normal stomach.                        - Normal examined duodenum.                        - No specimens collected. Recommendation:        - Perform a colonoscopy today. Procedure Code(s):     --- Professional ---                        [phone], Esophagogastroduodenoscopy, flexible,                         transoral; diagnostic, including collection of                         specimen(s) by brushing or washing, when performed                         (separate procedure) Diagnosis Code(s):     --- Professional ---                        R63.4, Abnormal weight loss CPT copyright 2022 American Medical Association. All rights reserved. The codes documented in this report are preliminary and upon coder review may  be revised to meet current compliance requirements. Wyline Mood, MD Wyline Mood MD, MD 01/04/2023 1:33:43 PM This report has been signed electronically. Number of Addenda: 0 Note Initiated On: 01/04/2023 1:14 PM Estimated Blood Loss:  Estimated blood loss: none.      [facility]

## 2023-01-04 NOTE — Anesthesia Preprocedure Evaluation (Signed)
Anesthesia Evaluation  Patient identified by MRN, date of birth, ID band Patient awake    Reviewed: Allergy & Precautions, H&P , NPO status , Patient's Chart, lab work & pertinent test results, reviewed documented beta blocker date and time   Airway Mallampati: II   Neck ROM: full    Dental  (+) Poor Dentition   Pulmonary neg pulmonary ROS   Pulmonary exam normal        Cardiovascular Exercise Tolerance: Poor hypertension, On Medications + angina with exertion + CAD  Normal cardiovascular exam+ Valvular Problems/Murmurs  Rhythm:regular Rate:Normal     Neuro/Psych   Anxiety      Neuromuscular disease CVA  negative psych ROS   GI/Hepatic Neg liver ROS,GERD  ,,  Endo/Other  negative endocrine ROSdiabetes    Renal/GU Renal disease  negative genitourinary   Musculoskeletal   Abdominal   Peds  Hematology negative hematology ROS (+)   Anesthesia Other Findings Past Medical History: No date: Anxiety No date: Back pain 08/21/2013: Coronary artery disease     Comment:  a. 08/2013 s/p CABG x 6; b. 05/2022 PCI: LAD 155m, 60d,               D2 50, LCX 80ost, 46m, 19m/d, OM1 100, RCA 100p,               VG->OM2->OM3 nl, VG->D2 30ost, 99d @ Diag insertion               (2.25x15 Onyx Frontier DES), VG->RCA nl, LIMA->LAD nl. EF              55-65%. No date: Diabetes mellitus No date: Diabetic neuropathy (HCC) No date: Diastolic dysfunction     Comment:  a. 02/2017 Echo: EF 55-60%, no rwma, Gr1 DD, mildly dil               Ao Root/RV. + PFO. 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 04/2017 MRI in setting of freq falls: small subacute               infarction of bilat centrum semi-ovale consistent w/               embolic event-->Eliquis. No date: Erectile dysfunction No date: Family history of anesthesia complication     Comment:  ' they cant wake my brother very  easy" No date: Fatty liver No date: Flank pain No date: GERD (gastroesophageal reflux disease) No date: History of bronchitis No date: History of chicken pox No date: HSV infection     Comment:  ocular symptoms and oral lesions No date: Hypercholesterolemia No date: Hypertension No date: Kidney stones No date: Osteoarthritis No date: PAF (paroxysmal atrial fibrillation) (HCC)     Comment:  a. Had post-op AF 08/2013; b. 09/2015 Holter: PAC's no AF;              c. 05/2017 Noted to be in AFib-->Eliquis (CHA2DS2VASc =               7); d. 08/2017 s/p DCCV-->ERAF-->Tikosyn added; e. 08/2020               Zio: 4% Afib burden. No date: PFO (patent foramen ovale)     Comment:  a. 02/2017 Echo: + PFO. No date: Sinus node dysfunction (HCC)     Comment:  a. 08/2020 Zio: Predominantly sinus bradycardia at 53               (  35-211).  20 SVT runs.  1 run of nonsustained VT x4               beats.  4% A-fib burden.  16 pauses lasting up to 3.6               seconds.  Intermittent junctional rhythm.  Patient               declined pacemaker. Past Surgical History: 08/21/2013: CARDIAC CATHETERIZATION     Comment:  DR Clifton Ivann Trimarco 08/24/2017: CARDIOVERSION; N/A     Comment:  Procedure: CARDIOVERSION;  Surgeon: Antonieta Iba,               MD;  Location: [facility] ORS;  Service: Cardiovascular;                Laterality: N/A; 1983: CHOLECYSTECTOMY 08/23/2013: CORONARY ARTERY BYPASS GRAFT; N/A     Comment:  Procedure: CORONARY ARTERY BYPASS GRAFTING (CABG);                Surgeon: Delight Ovens, MD;  Location: [facility] OR;                Service: Open Heart Surgery;  Laterality: N/A;  CABG               times six utilizing the left internal mammary artery and               the right greater saphenous vein harvested endoscopically 05/16/2022: CORONARY STENT INTERVENTION; N/A     Comment:  Procedure: CORONARY STENT INTERVENTION;  Surgeon: Iran Ouch, MD;  Location: [facility] INVASIVE CV LAB;                 Service: Cardiovascular;  Laterality: N/A; 08/23/2013: ENDOVEIN HARVEST OF GREATER SAPHENOUS VEIN; Right     Comment:  Procedure: ENDOVEIN HARVEST OF GREATER SAPHENOUS VEIN;                Surgeon: Delight Ovens, MD;  Location: [facility] OR;                Service: Open Heart Surgery;  Laterality: Right; No date: HERNIA REPAIR     Comment:  X3 08/23/2013: INTRAOPERATIVE TRANSESOPHAGEAL ECHOCARDIOGRAM; N/A     Comment:  Procedure: INTRAOPERATIVE TRANSESOPHAGEAL               ECHOCARDIOGRAM;  Surgeon: Delight Ovens, MD;                Location: [facility] OR;  Service: Open Heart Surgery;                Laterality: N/A; 05/16/2022: LEFT HEART CATH AND CORS/GRAFTS ANGIOGRAPHY; N/A     Comment:  Procedure: LEFT HEART CATH AND CORS/GRAFTS ANGIOGRAPHY;               Surgeon: Iran Ouch, MD;  Location: [facility] INVASIVE               CV LAB;  Service: Cardiovascular;  Laterality: N/A; 08/21/2013: LEFT HEART CATHETERIZATION WITH CORONARY ANGIOGRAM; N/A     Comment:  Procedure: LEFT HEART CATHETERIZATION WITH CORONARY               ANGIOGRAM;  Surgeon: Kathleene Hazel, MD;                Location: [facility] CATH LAB;  Service: Cardiovascular;  Laterality: N/A; BMI    Body Mass Index: 23.72 kg/m     Reproductive/Obstetrics negative OB ROS                             Anesthesia Physical Anesthesia Plan  ASA: 3  Anesthesia Plan: General   Post-op Pain Management:    Induction:   PONV Risk Score and Plan:   Airway Management Planned:   Additional Equipment:   Intra-op Plan:   Post-operative Plan:   Informed Consent: I have reviewed the patients History and Physical, chart, labs and discussed the procedure including the risks, benefits and alternatives for the proposed anesthesia with the patient or authorized representative who has indicated his/her understanding and acceptance.     Dental Advisory Given  Plan Discussed with:  CRNA  Anesthesia Plan Comments:        Anesthesia Quick Evaluation

## 2023-01-04 NOTE — Anesthesia Postprocedure Evaluation (Signed)
Anesthesia Post Note  Patient: [name] [name]  Procedure(s) Performed: COLONOSCOPY WITH PROPOFOL ESOPHAGOGASTRODUODENOSCOPY (EGD) WITH PROPOFOL  Patient location during evaluation: PACU Anesthesia Type: General Level of consciousness: awake and alert Pain management: pain level controlled Vital Signs Assessment: post-procedure vital signs reviewed and stable Respiratory status: spontaneous breathing, nonlabored ventilation, respiratory function stable and patient connected to nasal cannula oxygen Cardiovascular status: blood pressure returned to baseline and stable Postop Assessment: no apparent nausea or vomiting Anesthetic complications: no   There were no known notable events for this encounter.   Last Vitals:  Vitals:   01/04/23 1409 01/04/23 1419  BP: 118/72 126/70  Pulse: 62 (!) 59  Resp: 14 (!) 24  Temp:    SpO2: 97% 97%    Last Pain:  Vitals:   01/04/23 1419  PainSc: 0-No pain                 Yevette Edwards

## 2023-01-04 NOTE — Transfer of Care (Signed)
Immediate Anesthesia Transfer of Care Note  Patient: [name] [name]  Procedure(s) Performed: COLONOSCOPY WITH PROPOFOL ESOPHAGOGASTRODUODENOSCOPY (EGD) WITH PROPOFOL  Patient Location: PACU  Anesthesia Type:General  Level of Consciousness: drowsy  Airway & Oxygen Therapy: Patient Spontanous Breathing  Post-op Assessment: Report given to RN and Post -op Vital signs reviewed and stable  Post vital signs: stable  Last Vitals:  Vitals Value Taken Time  BP 107/69 01/04/23 1358  Temp    Pulse 57 01/04/23 1359  Resp 22 01/04/23 1359  SpO2 96 % 01/04/23 1359  Vitals shown include unvalidated device data.  Last Pain:  Vitals:   01/04/23 1223  PainSc: 0-No pain         Complications: No notable events documented.

## 2023-01-04 NOTE — Op Note (Signed)
[facility] Gastroenterology Patient Name: [name] [name] Procedure Date: 01/04/2023 1:14 PM MRN: [id number] Account #: [id number] Date of Birth: [birth year] Admit Type: Outpatient Age: 82 Room: [facility] ENDO ROOM 3 Gender: Male Note Status: Finalized Instrument Name: Prentice Docker [id number] Procedure:             Colonoscopy Indications:           Weight loss Providers:             Wyline Mood MD, MD Referring MD:          Wyline Mood MD, MD (Referring MD) Medicines:             Monitored Anesthesia Care Complications:         No immediate complications. Procedure:             Pre-Anesthesia Assessment:                        - Prior to the procedure, a History and Physical was                         performed, and patient medications, allergies and                         sensitivities were reviewed. The patient's tolerance                         of previous anesthesia was reviewed.                        - The risks and benefits of the procedure and the                         sedation options and risks were discussed with the                         patient. All questions were answered and informed                         consent was obtained.                        - ASA Grade Assessment: II - A patient with mild                         systemic disease.                        After obtaining informed consent, the colonoscope was                         passed under direct vision. Throughout the procedure,                         the patient's blood pressure, pulse, and oxygen                         saturations were monitored continuously. The                         Colonoscope was introduced through the  anus and                         advanced to the the cecum, identified by the                         appendiceal orifice. The colonoscopy was performed                         with ease. The patient tolerated the procedure well.                         The quality of  the bowel preparation was good. The                         ileocecal valve, appendiceal orifice, and rectum were                         photographed. Findings:      The perianal and digital rectal examinations were normal.      A 5 mm polyp was found in the rectum. The polyp was sessile. The polyp       was removed with a cold snare. Resection and retrieval were complete.      Four sessile polyps were found in the cecum. The polyps were 5 to 7 mm       in size. These polyps were removed with a cold snare. Resection and       retrieval were complete.      Multiple medium-mouthed diverticula were found in the entire colon.      The exam was otherwise without abnormality on direct and retroflexion       views. Impression:            - One 5 mm polyp in the rectum, removed with a cold                         snare. Resected and retrieved.                        - Four 5 to 7 mm polyps in the cecum, removed with a                         cold snare. Resected and retrieved.                        - Diverticulosis in the entire examined colon.                        - The examination was otherwise normal on direct and                         retroflexion views. Recommendation:        - Discharge patient to home (with escort).                        - Resume previous diet.                        - Continue present medications.                        -  Await pathology results.                        - Repeat colonoscopy is not recommended due to current                         age (87 years or older) for surveillance based on                         pathology results. Procedure Code(s):     --- Professional ---                        [phone], Colonoscopy, flexible; with removal of                         tumor(s), polyp(s), or other lesion(s) by snare                         technique Diagnosis Code(s):     --- Professional ---                        D12.8, Benign neoplasm of rectum                         D12.0, Benign neoplasm of cecum                        R63.4, Abnormal weight loss                        K57.30, Diverticulosis of large intestine without                         perforation or abscess without bleeding CPT copyright 2022 American Medical Association. All rights reserved. The codes documented in this report are preliminary and upon coder review may  be revised to meet current compliance requirements. Wyline Mood, MD Wyline Mood MD, MD 01/04/2023 1:58:20 PM This report has been signed electronically. Number of Addenda: 0 Note Initiated On: 01/04/2023 1:14 PM Scope Withdrawal Time: 0 hours 18 minutes 26 seconds  Total Procedure Duration: 0 hours 21 minutes 13 seconds  Estimated Blood Loss:  Estimated blood loss: none.      [facility]

## 2023-01-05 ENCOUNTER — Encounter: Payer: Self-pay | Admitting: Gastroenterology

## 2023-01-10 ENCOUNTER — Telehealth: Payer: Self-pay

## 2023-01-10 NOTE — Patient Outreach (Signed)
  Care Coordination   01/10/2023 Name: KIMSEY VELARDO MRN: [id number] DOB: 1940/10/18   Care Coordination Outreach Attempts:  An unsuccessful telephone outreach was attempted today to offer the patient information about available care coordination services. HIPAA compliant voice message left.  Follow Up Plan:  Additional outreach attempts will be made to offer the patient care coordination information and services.   Encounter Outcome:  No Answer   Care Coordination Interventions:  No, not indicated   [facility] Care Coordination [phone] direct line

## 2023-01-11 ENCOUNTER — Other Ambulatory Visit: Payer: Self-pay | Admitting: Internal Medicine

## 2023-01-15 ENCOUNTER — Other Ambulatory Visit: Payer: Self-pay

## 2023-01-15 ENCOUNTER — Encounter (HOSPITAL_COMMUNITY): Payer: Self-pay

## 2023-01-15 ENCOUNTER — Emergency Department (HOSPITAL_COMMUNITY)
Admission: EM | Admit: 2023-01-15 | Discharge: 2023-01-15 | Disposition: A | Discharge status: Home / Self Care | Payer: Medicare Other | Attending: Emergency Medicine | Admitting: Emergency Medicine

## 2023-01-15 DIAGNOSIS — Z7901 Long term (current) use of anticoagulants: Secondary | ICD-10-CM | POA: Insufficient documentation

## 2023-01-15 DIAGNOSIS — E119 Type 2 diabetes mellitus without complications: Secondary | ICD-10-CM | POA: Insufficient documentation

## 2023-01-15 DIAGNOSIS — L299 Pruritus, unspecified: Secondary | ICD-10-CM | POA: Diagnosis not present

## 2023-01-15 DIAGNOSIS — Z79899 Other long term (current) drug therapy: Secondary | ICD-10-CM | POA: Insufficient documentation

## 2023-01-15 DIAGNOSIS — I10 Essential (primary) hypertension: Secondary | ICD-10-CM | POA: Diagnosis not present

## 2023-01-15 DIAGNOSIS — R21 Rash and other nonspecific skin eruption: Secondary | ICD-10-CM | POA: Insufficient documentation

## 2023-01-15 MED ORDER — CEPHALEXIN 500 MG PO CAPS: 500.0000 mg | ORAL_CAPSULE | Freq: Four times a day (QID) | ORAL | 0 refills | Status: DC

## 2023-01-15 MED ORDER — PREDNISONE 20 MG PO TABS: 40.0000 mg | ORAL_TABLET | Freq: Every day | ORAL | 0 refills | Status: DC

## 2023-01-15 NOTE — ED Provider Notes (Signed)
[place] EMERGENCY DEPARTMENT AT [facility] Provider Note   CSN: [id number] Arrival date & time: 01/15/23  0405     History  Chief Complaint  Patient presents with   Rash    [name] [name] is a 82 y.o. male with past medical history significant for diabetes, hypertension who presents with concern for scattered rash on abdomen, groin, buttocks, arms for the last Few days.  Denies any rash on face, legs.  Patient thought that he may have been bitten by some ants but is unsure.  He denies any fever, chills.  He reports there is 1 lesion on his arm that he is concerned may be getting infected.   Rash      Home Medications Prior to Admission medications   Medication Sig Start Date End Date Taking? Authorizing Provider  cephALEXin (KEFLEX) 500 MG capsule Take 1 capsule (500 mg total) by mouth 4 (four) times daily. 01/15/23  Yes Apolo Cutshaw H, PA-C  predniSONE (DELTASONE) 20 MG tablet Take 2 tablets (40 mg total) by mouth daily. 01/15/23  Yes Darby Fleeman H, PA-C  acyclovir (ZOVIRAX) 800 MG tablet TAKE 1 TABLET TWICE A DAY 06/14/22   Joaquim Nam, MD  amLODipine (NORVASC) 10 MG tablet Take 1 tablet (10 mg total) by mouth daily. 05/12/22   Creig Hines, NP  apixaban (ELIQUIS) 5 MG TABS tablet Take 1 tablet (5 mg total) by mouth 2 (two) times daily. 10/18/22   Joaquim Nam, MD  ciclopirox [facility]) 8 % solution Apply topically. 09/07/22   [provider]  COD LIVER OIL PO Take 1 capsule by mouth 2 (two) times daily. 10 mcg    [provider]  dofetilide (TIKOSYN) 500 MCG capsule TAKE ONE CAPSULE BY MOUTH TWICE A DAY 05/16/22   Antonieta Iba, MD  ezetimibe (ZETIA) 10 MG tablet TAKE 1 TABLET DAILY 11/25/22   Antonieta Iba, MD  gabapentin (NEURONTIN) 600 MG tablet Take 0.5-1 tablets (300-600 mg total) by mouth 3 (three) times daily as needed. 07/07/20   Joaquim Nam, MD  isosorbide mononitrate (IMDUR) 30 MG 24 hr tablet Take 30  mg by mouth daily. 10/22/22   [provider]  loperamide (IMODIUM) 2 MG capsule Take 1 capsule (2 mg total) by mouth as needed for diarrhea or loose stools (Up to 4 doses in 24hrs). 12/23/22   Merwyn Katos, MD  loratadine (CLARITIN) 10 MG tablet Take 1 tablet (10 mg total) by mouth daily. 10/28/18   Joaquim Nam, MD  losartan (COZAAR) 100 MG tablet TAKE 1 TABLET DAILY 01/11/23   Marinus Maw, MD  metFORMIN (GLUCOPHAGE) 850 MG tablet Take 1 tablet (850 mg total) by mouth 2 (two) times daily with a meal. Resume 05/19/2022. 05/17/22   Dunn, Raymon Mutton, PA-C  nitroGLYCERIN (NITROSTAT) 0.4 MG SL tablet Place 1 tablet (0.4 mg total) under the tongue every 5 (five) minutes as needed for chest pain (max 3 doses). 05/12/22   Creig Hines, NP  nystatin (MYCOSTATIN/NYSTOP) powder Apply 1 Application topically 2 (two) times daily. Use until resolved and then 2 extra days. 10/18/22   Joaquim Nam, MD  Omega-3 Fatty Acids (FISH OIL) 1000 MG CAPS Take 1 capsule by mouth 2 (two) times daily.    [provider]  Omega-3 Fatty Acids (SALMON OIL PO) Take 1,000 mg by mouth. 1 twice daily Patient not taking: Reported on 12/08/2022    [provider]  pantoprazole (PROTONIX) 40  MG tablet Take 1 tablet (40 mg total) by mouth daily. 05/23/22   Dunn, Raymon Mutton, PA-C  spironolactone (ALDACTONE) 25 MG tablet Take 1 tablet (25 mg total) by mouth daily. 07/06/22 12/08/22  Creig Hines, NP  tamsulosin (FLOMAX) 0.4 MG CAPS capsule Take 0.4 mg by mouth daily. 01/21/22   [provider]  timolol (BETIMOL) 0.5 % ophthalmic solution Place 1 drop into both eyes 2 (two) times daily.    [provider]  valACYclovir (VALTREX) 1000 MG tablet Take 1 tablet (1,000 mg total) by mouth 2 (two) times daily as needed. 09/23/20   Joaquim Nam, MD      Allergies    Fenofibrate, Fire ant, and Statins    Review of Systems   Review of Systems  Skin:  Positive for rash.  All  other systems reviewed and are negative.   Physical Exam Updated Vital Signs BP 128/77 (BP Location: Right Arm)   Pulse (!) 54   Temp 97.6 F (36.4 C)   Resp 18   Ht 5\' 7"  (1.702 m)   Wt 74.8 kg   SpO2 99%   BMI 25.84 kg/m  Physical Exam Vitals and nursing note reviewed.  Constitutional:      General: He is not in acute distress.    Appearance: Normal appearance.  HENT:     Head: Normocephalic and atraumatic.  Eyes:     General:        Right eye: No discharge.        Left eye: No discharge.  Cardiovascular:     Rate and Rhythm: Normal rate and regular rhythm.  Pulmonary:     Effort: Pulmonary effort is normal. No respiratory distress.  Musculoskeletal:        General: No deformity.  Skin:    General: Skin is warm and dry.     Comments: Scattered red excoriations approximately 1 x 2 mm on bilateral arms, top of gluteal cleft, and a few places around the groin.  There is 1 lesion on the left wrist that shows early signs of developing cellulitis.  No targetoid lesions, no Nikolsky sign, no listers, vesicles, rashes not unilateral or in dermatomal distribution.  Neurological:     Mental Status: He is alert and oriented to person, place, and time.  Psychiatric:        Mood and Affect: Mood normal.        Behavior: Behavior normal.     ED Results / Procedures / Treatments   Labs (all labs ordered are listed, but only abnormal results are displayed) Labs Reviewed - No data to display  EKG None  Radiology No results found.  Procedures Procedures    Medications Ordered in ED Medications - No data to display  ED Course/ Medical Decision Making/ A&P                             Medical Decision Making  This patient is a 82 y.o. male who presents to the ED for concern of rash.   Differential diagnoses prior to evaluation: Rash secondary to bug bites, contact dermatitis, poison ivy, scabies, bedbugs, versus other  Past Medical History / Social History /  Additional history: Chart reviewed. Pertinent results include: Diabetes, hypertension, otherwise noncontributory  Physical Exam: Physical exam performed. The pertinent findings include: Scattered red excoriations approximately 1 x 2 mm on bilateral arms, top of gluteal cleft, and a few places around the  groin.  There is 1 lesion on the left wrist that shows early signs of developing cellulitis.  No targetoid lesions, no Nikolsky sign, no listers, vesicles, rashes not unilateral or in dermatomal distribution.  Medications / Treatment: Discussed with patient and think that short course of Keflex, as well as 5 days of steroids should help with the itching, encouraged over-the-counter Sarna for itching, and PCP follow-up to ensure resolution.  Patient understands agrees to plan, and is discharged in stable condition at this time.   Disposition: After consideration of the diagnostic results and the patients response to treatment, I feel that patient with some scattered excoriations, redness consistent with bug bites versus scattered contact dermatitis, will treat as above.   emergency department workup does not suggest an emergent condition requiring admission or immediate intervention beyond what has been performed at this time. The plan is: as above. The patient is safe for discharge and has been instructed to return immediately for worsening symptoms, change in symptoms or any other concerns.  Final Clinical Impression(s) / ED Diagnoses Final diagnoses:  Rash  Itching    Rx / DC Orders ED Discharge Orders          Ordered    cephALEXin (KEFLEX) 500 MG capsule  4 times daily        01/15/23 0716    predniSONE (DELTASONE) 20 MG tablet  Daily        01/15/23 0716              Olene Floss, PA-C 01/15/23 0725    Marily Memos, MD 01/22/23 [id number]

## 2023-01-15 NOTE — ED Notes (Signed)
Pt sitting in chair dressed and ready to go home, pt states that he needs to get home to take his morning medications. Pt denies pain, pt verbalized understanding d/c and follow up, pt ambulatory from dpt.

## 2023-01-15 NOTE — ED Triage Notes (Signed)
Pt has had rash come up all over body. Nothing new has been introduced to pt. Bumps are hard and appear to had a scab on some of them. Rash is itchy and painful. Tonight the rash kept him up all night so he decided to come in. He has been  putting clobetasol on rash with no improvement

## 2023-01-15 NOTE — Discharge Instructions (Signed)
Can take the prescribed steroid Keflex to cover for cellulitis, and help with your itching.  You can use the over the counter lotion I discussed, Sarna, as this is quite helpful with itching. Do your best to refrain from scratching.

## 2023-01-19 ENCOUNTER — Telehealth: Payer: Self-pay

## 2023-01-19 ENCOUNTER — Encounter: Payer: Medicare Other | Admitting: Pharmacist

## 2023-01-19 NOTE — Telephone Encounter (Signed)
Transition Care Management Unsuccessful Follow-up Telephone Call  Date of discharge and from where:  01/15/2023 [facility]  Attempts:  1st Attempt  Reason for unsuccessful TCM follow-up call:  No answer/busy  Akiva Brassfield [facility]  [facility] Population Health Community Resource Care Guide   [email or web address]  ?? [phone]   Website: [email or web address]  [email or web address]

## 2023-01-20 ENCOUNTER — Telehealth: Payer: Self-pay | Admitting: *Deleted

## 2023-01-20 ENCOUNTER — Telehealth: Payer: Self-pay

## 2023-01-20 NOTE — Telephone Encounter (Signed)
Transition Care Management Unsuccessful Follow-up Telephone Call  Date of discharge and from where:  01/15/2023 [facility]  Attempts:  2nd Attempt  Reason for unsuccessful TCM follow-up call:  Left voice message  Tracie Lindbloom [facility]  [facility] Population Health Community Resource Care Guide   [email or web address]  ?? [phone]   Website: [email or web address]  [email or web address]

## 2023-01-20 NOTE — Progress Notes (Unsigned)
  Care Coordination  Outreach Note  01/20/2023 Name: [name] [name] MRN: [id number] DOB: 1940/11/04   Care Coordination Outreach Attempts: An unsuccessful telephone outreach was attempted today to offer the patient information about available care coordination services.  Follow Up Plan:  Additional outreach attempts will be made to offer the patient care coordination information and services.   Encounter Outcome:  No Answer  Burman Nieves, CCMA Care Coordination Care Guide Direct Dial: [phone]

## 2023-01-23 ENCOUNTER — Other Ambulatory Visit: Payer: Self-pay

## 2023-01-23 ENCOUNTER — Other Ambulatory Visit: Payer: Medicare Other

## 2023-01-23 ENCOUNTER — Encounter: Payer: Self-pay | Admitting: Family Medicine

## 2023-01-23 ENCOUNTER — Ambulatory Visit (INDEPENDENT_AMBULATORY_CARE_PROVIDER_SITE_OTHER): Payer: Medicare Other | Admitting: Family Medicine

## 2023-01-23 VITALS — BP 90/60 | HR 99 | Temp 98.0°F | Ht 67.0 in | Wt 156.2 lb

## 2023-01-23 DIAGNOSIS — B3742 Candidal balanitis: Secondary | ICD-10-CM | POA: Diagnosis not present

## 2023-01-23 MED ORDER — DOFETILIDE 500 MCG PO CAPS: 500.0000 ug | ORAL_CAPSULE | Freq: Two times a day (BID) | ORAL | 0 refills | Status: DC

## 2023-01-23 NOTE — Patient Instructions (Signed)
Clotrimazole cream - use 3 times a day all along the area under the foreskin and where it is red and painful.   (Will be with all of the other athlete's foot creams)

## 2023-01-23 NOTE — Progress Notes (Signed)
[name] Lepage T. Salinda Snedeker, MD, CAQ Sports Medicine [facility] [address] [place], [address]  Phone: [phone]  FAX: [phone]  [name] [name] - 82 y.o. male  MRN [id number]  Date of Birth: [birth year]  Date: 01/23/2023  PCP: Joaquim Nam, MD  Referral: Joaquim Nam, MD  Chief Complaint  Patient presents with   Rash    Genital Area   Subjective:   [name] [name] is a 82 y.o. very pleasant male patient with Body mass index is 24.47 kg/m. who presents with the following:  Penis is red and has a rash.  Was on prednisone last week due to a rash.   Balanitis.  He has been having a reddened appearance at the distal penis with some tenderness to palpation underneath the foreskin.  This has been worsening over the last few days.  He has put some over-the-counter pain relief cream on it.  Has also been repeatedly hit his [place] recently [place] his [place].  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  Review of Systems is noted in the HPI, as appropriate  Objective:   BP 90/60 (BP Location: Left Arm, Patient Position: Sitting, Cuff Size: Normal)   Pulse 99   Temp 98 F (36.7 C) (Temporal)   Ht 5\' 7"  (1.702 m)   Wt 156 lb 4 oz (70.9 kg)   SpO2 96%   BMI 24.47 kg/m   GEN: No acute distress; alert,appropriate. PULM: Breathing comfortably in no respiratory distress PSYCH: Normally interactive.   At the distal penis underneath the foreskin there is some redness and a pain with palpation  Laboratory and Imaging Data:  Assessment and Plan:     ICD-10-CM   1. 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  Patient Instructions  Clotrimazole cream - use 3 times a day all along the area under the foreskin and where it is red and painful.   (Will be with all of the other  athlete's foot creams)   Medication Management during today's office visit: No orders of the defined types were placed in this encounter.  Medications Discontinued During This Encounter  Medication Reason   cephALEXin (KEFLEX) 500 MG capsule Completed Course   predniSONE (DELTASONE) 20 MG tablet Completed Course    Orders placed today for conditions managed today: No orders of the defined types were placed in this encounter.   Disposition: No follow-ups on file.  Dragon [employer] One speech-to-text software was used for transcription in this dictation.  Possible transcriptional errors can occur using Animal nutritionist.   Signed,  Elpidio Galea. Jezabella Schriever, MD   Outpatient Encounter Medications as of 01/23/2023  Medication Sig   acyclovir (ZOVIRAX) 800 MG tablet TAKE 1 TABLET TWICE A DAY   amLODipine (NORVASC) 10 MG tablet Take 1 tablet (10 mg total) by mouth daily.   apixaban (ELIQUIS) 5 MG TABS tablet Take 1 tablet (5 mg total) by mouth 2 (two) times daily.   ciclopirox (PENLAC) 8 % solution Apply topically.   COD LIVER OIL PO Take 1 capsule by mouth 2 (two) times daily. 10 mcg   dofetilide (TIKOSYN) 500 MCG capsule TAKE ONE CAPSULE BY MOUTH TWICE A DAY   ezetimibe (ZETIA) 10 MG tablet TAKE 1 TABLET DAILY   gabapentin (NEURONTIN) 600 MG  tablet Take 0.5-1 tablets (300-600 mg total) by mouth 3 (three) times daily as needed.   isosorbide mononitrate (IMDUR) 30 MG 24 hr tablet Take 30 mg by mouth daily.   loperamide (IMODIUM) 2 MG capsule Take 1 capsule (2 mg total) by mouth as needed for diarrhea or loose stools (Up to 4 doses in 24hrs).   loratadine (CLARITIN) 10 MG tablet Take 1 tablet (10 mg total) by mouth daily.   losartan (COZAAR) 100 MG tablet TAKE 1 TABLET DAILY   metFORMIN (GLUCOPHAGE) 850 MG tablet Take 1 tablet (850 mg total) by mouth 2 (two) times daily with a meal. Resume 05/19/2022.   nitroGLYCERIN (NITROSTAT) 0.4 MG SL tablet Place 1 tablet (0.4 mg total) under the tongue  every 5 (five) minutes as needed for chest pain (max 3 doses).   nystatin (MYCOSTATIN/NYSTOP) powder Apply 1 Application topically 2 (two) times daily. Use until resolved and then 2 extra days.   Omega-3 Fatty Acids (FISH OIL) 1000 MG CAPS Take 1 capsule by mouth 2 (two) times daily.   Omega-3 Fatty Acids (SALMON OIL PO) Take 1,000 mg by mouth. 1 twice daily   pantoprazole (PROTONIX) 40 MG tablet Take 1 tablet (40 mg total) by mouth daily.   tamsulosin (FLOMAX) 0.4 MG CAPS capsule Take 0.4 mg by mouth daily.   timolol (BETIMOL) 0.5 % ophthalmic solution Place 1 drop into both eyes 2 (two) times daily.   valACYclovir (VALTREX) 1000 MG tablet Take 1 tablet (1,000 mg total) by mouth 2 (two) times daily as needed.   spironolactone (ALDACTONE) 25 MG tablet Take 1 tablet (25 mg total) by mouth daily.   [DISCONTINUED] cephALEXin (KEFLEX) 500 MG capsule Take 1 capsule (500 mg total) by mouth 4 (four) times daily.   [DISCONTINUED] predniSONE (DELTASONE) 20 MG tablet Take 2 tablets (40 mg total) by mouth daily.   No facility-administered encounter medications on file as of 01/23/2023.

## 2023-01-23 NOTE — Progress Notes (Signed)
   01/23/2023  Patient ID: [name] [name], male   DOB: 12/27/1940, 82 y.o.   MRN: [id number]  Patient outreach for scheduled telephone visit to assist patient with affordability of dofetilide prescription.  Patient previously approved for tier reduction, and this was expiring 12/16/22 and would need renewal.  Contacted patient, and he received notification 12/16/22 from is part D plan that tier reduction for dofeitilide has been approved through 12/16/23.  He currently has another 60 days of medication on hand, but he will contact me directly if there are any cost concerns when the refill is due.  I also informed him he can reach out to me to assist with renewing tier reduction next year when needed.  Lenna Gilford, PharmD, DPLA

## 2023-01-26 NOTE — Progress Notes (Signed)
  Care Coordination   Note   01/26/2023 Name: [name] [name] MRN: [id number] DOB: 03-May-1941  [name] [name] is a 82 y.o. year old male who sees Joaquim Nam, MD for primary care. I reached out to Joselyn Glassman by phone today to offer care coordination services.  [name] was given information about Care Coordination services today including:   The Care Coordination services include support from the care team which includes your Nurse Coordinator, Clinical Social Worker, or Pharmacist.  The Care Coordination team is here to help remove barriers to the health concerns and goals most important to you. Care Coordination services are voluntary, and the patient may decline or stop services at any time by request to their care team member.   Care Coordination Consent Status: Patient agreed to services and verbal consent obtained.   Follow up plan:  Telephone appointment with care coordination team member scheduled for:  02/06/2023  Encounter Outcome:  Pt. Scheduled  Burman Nieves, CCMA Care Coordination Care Guide Direct Dial: [phone]

## 2023-01-27 ENCOUNTER — Telehealth: Payer: Self-pay | Admitting: Family Medicine

## 2023-01-27 NOTE — Telephone Encounter (Signed)
Patient Called in stating that the rash on his penis that he seen Dr Patsy Lager for is healing up however now it has now spread to his back ,bottom of stomach,under arms.He said that it itches really bad.He said that the rash is really hard and feel like a kernel is in it. He would like to come in to get reevaluated by Dr Elisha Ponder I have him scheduled for next week,in the meantime he would like to know if he can have something called in for him to try over the weekend for the itching?

## 2023-01-27 NOTE — Telephone Encounter (Signed)
Spoke with patient.  Unfortunately difficult to Rx without evaluating.  Prednisone 40mg  helped. But now rash seems to be coming back. Very itchy.   He took OTC anti itch cream with some benefit.  States he can't take benadryl due to [place] use.  Discussed topical benadryl vs calomine lotion vs regular moisturizing cream, oatmeal bath, cortizone-10 in interim.

## 2023-01-27 NOTE — Telephone Encounter (Signed)
Dr. Para March and Dr. Patsy Lager are both out of the office this week.  Will forward to Dr. Reece Agar for recommendations.

## 2023-01-29 NOTE — Telephone Encounter (Signed)
Noted. Will see at OV.  Thanks.  

## 2023-01-31 ENCOUNTER — Encounter: Payer: Self-pay | Admitting: Family Medicine

## 2023-01-31 ENCOUNTER — Telehealth: Payer: Self-pay | Admitting: Family Medicine

## 2023-01-31 ENCOUNTER — Ambulatory Visit (INDEPENDENT_AMBULATORY_CARE_PROVIDER_SITE_OTHER): Payer: Medicare Other | Admitting: Family Medicine

## 2023-01-31 VITALS — BP 106/62 | HR 60 | Temp 98.2°F | Ht 67.0 in | Wt 156.0 lb

## 2023-01-31 DIAGNOSIS — R0683 Snoring: Secondary | ICD-10-CM | POA: Diagnosis not present

## 2023-01-31 DIAGNOSIS — R21 Rash and other nonspecific skin eruption: Secondary | ICD-10-CM

## 2023-01-31 DIAGNOSIS — I609 Nontraumatic subarachnoid hemorrhage, unspecified: Secondary | ICD-10-CM

## 2023-01-31 NOTE — Patient Instructions (Addendum)
If you have more headaches or feel lightheaded, then let me know.  I would like to recheck your memory and your sugar in about 1-2 months.  We can do labs at the visit.    If you are lightheaded, then cut amlodipine in half, down to 5mg  a day.  Update me as needed.    Let me know if the rash or the itching come back.   Let me know if you don't get a call about seeing Dr. Tresa Endo.   Take care.  Glad to see you.

## 2023-01-31 NOTE — Telephone Encounter (Signed)
Patient contacted the office requesting to speak with Merdis Delay, advised pt she was not in thew office at the moment and I would send as message for her to call back at a later time. Please advise, thank you

## 2023-01-31 NOTE — Progress Notes (Unsigned)
Rash  Since beginning of June. Went to ER June 9th and was given 5 days of prednisone and a cream to use. Patient states he is somewhat better but still present.  He had blistered lesion on the L forearm.  That ruptured and is slowly healing,clearly improved in the meantime.  Other lesions didn't blister.  Back was prev itching but that is better in the meantime.  Done with keflex and prednisone. He isn't itching much now, he is clearly better, "it's like night and day."   He had a headache with leaning over to pick up some bottled water.  This was about 1 week ago.  He is on mult BP meds and flomax.  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  He can carry on normal conversation today but he has trouble with grocery shopping especially he if doesn't have a list.  No HA in the meantime.  D/w pt about prev SDH/SAH, d/w pt about monitoring memory and HA.  Orthostatic cautions d/w pt.    He is snoring, d/w pt about f/u with Dr. Tresa Endo about OSA eval.    He is back on anticoagulation in the meantime.    Meds, vitals, and allergies reviewed.   ROS: Per HPI unless specifically indicated in ROS section   Nad [place] Neck supple, no LA Small scabbed lesion on L forearm.  Doesn't appear infected.   Rrr Ctab No other rash.   Gross motor exam wnl X4.   Speech normal, symmetric smile.

## 2023-02-01 NOTE — Assessment & Plan Note (Signed)
H/o SAH.  He had a headache with leaning over to pick up some bottled water.  This was about 1 week ago.  He is on mult BP meds and flomax.  H/o occ memory lapses.  He can carry on normal conversation today but he has trouble with grocery shopping especially he if doesn't have a list.  No HA in the meantime.  D/w pt about prev SDH/SAH, d/w pt about monitoring memory and HA.  Orthostatic cautions d/w pt.  He declined repeat imaging at this point but he offered to observe his symptoms and let me know if he had any worsening symptoms.  It may have been that his episode last week was related to relative orthostasis and not a true primary neurologic event. 30 minutes were devoted to patient care in this encounter (this includes time spent reviewing the patient's file/history, interviewing and examining the patient, counseling/reviewing plan with patient).

## 2023-02-01 NOTE — Assessment & Plan Note (Signed)
He is snoring, d/w pt about f/u with Dr. Tresa Endo about OSA eval.

## 2023-02-01 NOTE — Assessment & Plan Note (Signed)
Done with keflex and prednisone. He isn't itching much now, he is clearly better, "it's like night and day."  He'll update me as needed.

## 2023-02-02 ENCOUNTER — Other Ambulatory Visit: Payer: Medicare Other | Admitting: Pharmacist

## 2023-02-05 ENCOUNTER — Other Ambulatory Visit: Payer: Self-pay | Admitting: Family Medicine

## 2023-02-05 DIAGNOSIS — M810 Age-related osteoporosis without current pathological fracture: Secondary | ICD-10-CM

## 2023-02-05 DIAGNOSIS — E1149 Type 2 diabetes mellitus with other diabetic neurological complication: Secondary | ICD-10-CM

## 2023-02-06 ENCOUNTER — Ambulatory Visit: Payer: Self-pay | Admitting: *Deleted

## 2023-02-06 NOTE — Patient Instructions (Incomplete)
[name] [name] , Thank you for taking time to come for your Medicare Wellness Visit. I appreciate your ongoing commitment to your health goals. Please review the following plan we discussed and let me know if I can assist you in the future.   These are the goals we discussed:  Goals      Remain active and independent        This is a list of the screening recommended for you and due dates:  Health Maintenance  Topic Date Due   Zoster (Shingles) Vaccine (1 of 2) Never done   Yearly kidney health urinalysis for diabetes  06/03/2015   Complete foot exam   11/04/2020   Eye exam for diabetics  06/01/2021   COVID-19 Vaccine (8 - 2023-24 season) 02/16/2023*   Flu Shot  03/09/2023   Hemoglobin A1C  03/26/2023   Yearly kidney function blood test for diabetes  12/23/2023   Medicare Annual Wellness Visit  02/07/2024   DTaP/Tdap/Td vaccine (3 - Td or Tdap) 10/25/2028   Pneumonia Vaccine  Completed   HPV Vaccine  Aged Out  *Topic was postponed. The date shown is not the original due date.    Advanced directives: Information on Advanced Care Planning can be found at [facility] Advance Health Care Directives Advance Health Care Directives ([email or web address] Please bring a copy of your health care power of attorney and living will to the office to be added to your chart at your convenience.  Conditions/risks identified: Aim for 30 minutes of exercise or brisk walking, 6-8 glasses of water, and 5 servings of fruits and vegetables each day.  Next appointment: Follow up in one year for your annual wellness visit.   Preventive Care 4 Years and Older, Male  Preventive care refers to lifestyle choices and visits with your health care provider that can promote health and wellness. What does preventive care include? A yearly physical exam. This is also called an annual well check. Dental exams once or twice a year. Routine eye exams. Ask your health care provider how often you should have  your eyes checked. Personal lifestyle choices, including: Daily care of your teeth and gums. Regular physical activity. Eating a healthy diet. Avoiding tobacco and drug use. Limiting alcohol use. Practicing safe sex. Taking low doses of aspirin every day. Taking vitamin and mineral supplements as recommended by your health care provider. What happens during an annual well check? The services and screenings done by your health care provider during your annual well check will depend on your age, overall health, lifestyle risk factors, and family history of disease. Counseling  Your health care provider may ask you questions about your: Alcohol use. Tobacco use. Drug use. Emotional well-being. Home and relationship well-being. Sexual activity. Eating habits. History of falls. Memory and ability to understand (cognition). Work and work Astronomer. Screening  You may have the following tests or measurements: Height, weight, and BMI. Blood pressure. Lipid and cholesterol levels. These may be checked every 5 years, or more frequently if you are over 47 years old. Skin check. Lung cancer screening. You may have this screening every year starting at age 35 if you have a 30-pack-year history of smoking and currently smoke or have quit within the past 15 years. Fecal occult blood test (FOBT) of the stool. You may have this test every year starting at age 32. Flexible sigmoidoscopy or colonoscopy. You may have a sigmoidoscopy every 5 years or a colonoscopy every 10 years starting at  age 90. Prostate cancer screening. Recommendations will vary depending on your family history and other risks. Hepatitis C blood test. Hepatitis B blood test. Sexually transmitted disease (STD) testing. Diabetes screening. This is done by checking your blood sugar (glucose) after you have not eaten for a while (fasting). You may have this done every 1-3 years. Abdominal aortic aneurysm (AAA) screening. You may  need this if you are a current or former smoker. Osteoporosis. You may be screened starting at age 5 if you are at high risk. Talk with your health care provider about your test results, treatment options, and if necessary, the need for more tests. Vaccines  Your health care provider may recommend certain vaccines, such as: Influenza vaccine. This is recommended every year. Tetanus, diphtheria, and acellular pertussis (Tdap, Td) vaccine. You may need a Td booster every 10 years. Zoster vaccine. You may need this after age 81. Pneumococcal 13-valent conjugate (PCV13) vaccine. One dose is recommended after age 49. Pneumococcal polysaccharide (PPSV23) vaccine. One dose is recommended after age 36. Talk to your health care provider about which screenings and vaccines you need and how often you need them. This information is not intended to replace advice given to you by your health care provider. Make sure you discuss any questions you have with your health care provider. Document Released: 08/21/2015 Document Revised: 04/13/2016 Document Reviewed: 05/26/2015 Elsevier Interactive Patient Education  2017 [employer].  Fall Prevention in the Home Falls can cause injuries. They can happen to people of all ages. There are many things you can do to make your home safe and to help prevent falls. What can I do on the outside of my home? Regularly fix the edges of walkways and driveways and fix any cracks. Remove anything that might make you trip as you walk through a door, such as a raised step or threshold. Trim any bushes or trees on the path to your home. Use bright outdoor lighting. Clear any walking paths of anything that might make someone trip, such as rocks or tools. Regularly check to see if handrails are loose or broken. Make sure that both sides of any steps have handrails. Any raised decks and porches should have guardrails on the edges. Have any leaves, snow, or ice cleared  regularly. Use sand or salt on walking paths during winter. Clean up any spills in your garage right away. This includes oil or grease spills. What can I do in the bathroom? Use night lights. Install grab bars by the toilet and in the tub and shower. Do not use towel bars as grab bars. Use non-skid mats or decals in the tub or shower. If you need to sit down in the shower, use a plastic, non-slip stool. Keep the floor dry. Clean up any water that spills on the floor as soon as it happens. Remove soap buildup in the tub or shower regularly. Attach bath mats securely with double-sided non-slip rug tape. Do not have throw rugs and other things on the floor that can make you trip. What can I do in the bedroom? Use night lights. Make sure that you have a light by your bed that is easy to reach. Do not use any sheets or blankets that are too big for your bed. They should not hang down onto the floor. Have a firm chair that has side arms. You can use this for support while you get dressed. Do not have throw rugs and other things on the floor that can make  you trip. What can I do in the kitchen? Clean up any spills right away. Avoid walking on wet floors. Keep items that you use a lot in easy-to-reach places. If you need to reach something above you, use a strong step stool that has a grab bar. Keep electrical cords out of the way. Do not use floor polish or wax that makes floors slippery. If you must use wax, use non-skid floor wax. Do not have throw rugs and other things on the floor that can make you trip. What can I do with my stairs? Do not leave any items on the stairs. Make sure that there are handrails on both sides of the stairs and use them. Fix handrails that are broken or loose. Make sure that handrails are as long as the stairways. Check any carpeting to make sure that it is firmly attached to the stairs. Fix any carpet that is loose or worn. Avoid having throw rugs at the top or  bottom of the stairs. If you do have throw rugs, attach them to the floor with carpet tape. Make sure that you have a light switch at the top of the stairs and the bottom of the stairs. If you do not have them, ask someone to add them for you. What else can I do to help prevent falls? Wear shoes that: Do not have high heels. Have rubber bottoms. Are comfortable and fit you well. Are closed at the toe. Do not wear sandals. If you use a stepladder: Make sure that it is fully opened. Do not climb a closed stepladder. Make sure that both sides of the stepladder are locked into place. Ask someone to hold it for you, if possible. Clearly mark and make sure that you can see: Any grab bars or handrails. First and last steps. Where the edge of each step is. Use tools that help you move around (mobility aids) if they are needed. These include: Canes. Walkers. Scooters. Crutches. Turn on the lights when you go into a dark area. Replace any light bulbs as soon as they burn out. Set up your furniture so you have a clear path. Avoid moving your furniture around. If any of your floors are uneven, fix them. If there are any pets around you, be aware of where they are. Review your medicines with your doctor. Some medicines can make you feel dizzy. This can increase your chance of falling. Ask your doctor what other things that you can do to help prevent falls. This information is not intended to replace advice given to you by your health care provider. Make sure you discuss any questions you have with your health care provider. Document Released: 05/21/2009 Document Revised: 12/31/2015 Document Reviewed: 08/29/2014 Elsevier Interactive Patient Education  2017 [employer].

## 2023-02-06 NOTE — Patient Outreach (Signed)
  Care Coordination   Initial Visit Note   02/06/2023 Name: [name] [name] MRN: [id number] DOB: 1940/12/21  [name] [name] is a 82 y.o. year old male who sees Joaquim Nam, MD for primary care. I spoke with  Joselyn Glassman by phone today.  What matters to the patients health and wellness today?  Needs assessment completed, patient describes a positive network of family support. Reports feeling much better physically and mentally since hospitalization. Denies symptoms of depression at this time, reporting his ability to cope grows stronger daily. Patient declines any further follow up at this time.   Goals Addressed             This Visit's Progress    Care Coordination Activities       Interventions Today    Flowsheet Row Most Recent Value  Chronic Disease   Chronic disease during today's visit Other  [depression]  General Interventions   General Interventions Discussed/Reviewed General Interventions Discussed  Mental Health Interventions   Mental Health Discussed/Reviewed Mental Health Discussed, Depression  [patient denied having symptoms at this time, reports feeling better daily, discussed increased ability to utilize postive coping strategies-emotional suppot provided, denied need for ongoing mental health follow up.]              SDOH assessments and interventions completed:  Yes  SDOH Interventions Today    Flowsheet Row Most Recent Value  SDOH Interventions   Food Insecurity Interventions Intervention Not Indicated  Housing Interventions Intervention Not Indicated  Transportation Interventions Intervention Not Indicated        Care Coordination Interventions:  Yes, provided  Interventions Today    Flowsheet Row Most Recent Value  Chronic Disease   Chronic disease during today's visit Other  [depression]  General Interventions   General Interventions Discussed/Reviewed General Interventions Discussed  Mental Health Interventions   Mental Health  Discussed/Reviewed Mental Health Discussed, Depression  [patient denied having symptoms at this time, reports feeling better daily, discussed increased ability to utilize postive coping strategies-emotional suppot provided, denied need for ongoing mental health follow up.]       Follow up plan: No further intervention required.   Encounter Outcome:  Pt. Visit Completed

## 2023-02-06 NOTE — Patient Instructions (Signed)
Visit Information  Thank you for taking time to visit with me today. Please don't hesitate to contact me if I can be of assistance to you.   Following are the goals we discussed today:  Please contact this social worker with any additional community resource needs that may arise  Please call the care guide team at [phone] if you need to cancel or reschedule your appointment.   If you are experiencing a Mental Health or Behavioral Health Crisis or need someone to talk to, please call the Suicide and Crisis Lifeline: 988   The patient verbalized understanding of instructions, educational materials, and care plan provided today and DECLINED offer to receive copy of patient instructions, educational materials, and care plan.   No further follow up required: patient to contact this social worker if any additional needs may arise  Verna Czech, LCSW Clinical Social Worker  [facility] Care Management [phone]

## 2023-02-06 NOTE — Progress Notes (Unsigned)
Subjective:   [name] [name] is a 82 y.o. male who presents for Medicare Annual/Subsequent preventive examination.  Visit Complete: {VISITMETHOD:[phone]}  Patient Medicare AWV questionnaire was completed by the patient on ***; I have confirmed that all information answered by patient is correct and no changes since this date.  Review of Systems    ***       Objective:    There were no vitals filed for this visit. There is no height or weight on file to calculate BMI.     01/15/2023    4:20 AM 01/04/2023   12:17 PM 09/24/2022    9:01 AM 09/23/2022    7:52 AM 09/22/2022   10:56 AM 05/16/2022    3:00 PM 09/18/2017    3:15 PM  Advanced Directives  Does Patient Have a Medical Advance Directive? No No No No No No No  Would patient like information on creating a medical advance directive?  No - Patient declined   No - Patient declined No - Patient declined No - Patient declined    Current Medications (verified) Outpatient Encounter Medications as of 02/07/2023  Medication Sig   acyclovir (ZOVIRAX) 800 MG tablet TAKE 1 TABLET TWICE A DAY   amLODipine (NORVASC) 10 MG tablet Take 1 tablet (10 mg total) by mouth daily.   apixaban (ELIQUIS) 5 MG TABS tablet Take 1 tablet (5 mg total) by mouth 2 (two) times daily.   ciclopirox (PENLAC) 8 % solution Apply topically.   COD LIVER OIL PO Take 1 capsule by mouth 2 (two) times daily. 10 mcg   dofetilide (TIKOSYN) 500 MCG capsule Take 1 capsule (500 mcg total) by mouth 2 (two) times daily.   ezetimibe (ZETIA) 10 MG tablet TAKE 1 TABLET DAILY   gabapentin (NEURONTIN) 600 MG tablet Take 0.5-1 tablets (300-600 mg total) by mouth 3 (three) times daily as needed.   isosorbide mononitrate (IMDUR) 30 MG 24 hr tablet Take 30 mg by mouth daily.   loperamide (IMODIUM) 2 MG capsule Take 1 capsule (2 mg total) by mouth as needed for diarrhea or loose stools (Up to 4 doses in 24hrs).   loratadine (CLARITIN) 10 MG tablet Take 1 tablet (10 mg total) by mouth  daily.   losartan (COZAAR) 100 MG tablet TAKE 1 TABLET DAILY   metFORMIN (GLUCOPHAGE) 850 MG tablet Take 1 tablet (850 mg total) by mouth 2 (two) times daily with a meal. Resume 05/19/2022.   nitroGLYCERIN (NITROSTAT) 0.4 MG SL tablet Place 1 tablet (0.4 mg total) under the tongue every 5 (five) minutes as needed for chest pain (max 3 doses).   nystatin (MYCOSTATIN/NYSTOP) powder Apply 1 Application topically 2 (two) times daily. Use until resolved and then 2 extra days.   Omega-3 Fatty Acids (FISH OIL) 1000 MG CAPS Take 1 capsule by mouth 2 (two) times daily.   pantoprazole (PROTONIX) 40 MG tablet Take 1 tablet (40 mg total) by mouth daily.   spironolactone (ALDACTONE) 25 MG tablet Take 1 tablet (25 mg total) by mouth daily.   tamsulosin (FLOMAX) 0.4 MG CAPS capsule Take 0.4 mg by mouth daily.   timolol (BETIMOL) 0.5 % ophthalmic solution Place 1 drop into both eyes 2 (two) times daily.   No facility-administered encounter medications on file as of 02/07/2023.    Allergies (verified) Fenofibrate, Fire ant, and Statins   History: Past Medical History:  Diagnosis Date   Anxiety    Back pain    Coronary artery disease 08/21/2013   a. 08/2013  s/p CABG x 6; b. 05/2022 PCI: LAD 168m, 60d, D2 50, LCX 80ost, 37m, 45m/d, OM1 100, RCA 100p, VG->OM2->OM3 nl, VG->D2 30ost, 99d @ Diag insertion (2.25x15 Onyx Frontier DES), VG->RCA nl, LIMA->LAD nl. EF 55-65%.   Diabetes mellitus    Diabetic neuropathy (HCC)    Diastolic dysfunction    a. 02/2017 Echo: EF 55-60%, no rwma, Gr1 DD, mildly dil Ao Root/RV. + PFO.   Diverticulosis    Diverticulosis    Embolic stroke (HCC)    a. 04/2017 MRI in setting of freq falls: small subacute infarction of bilat centrum semi-ovale consistent w/ embolic event-->Eliquis.   Erectile dysfunction    Family history of anesthesia complication    ' they cant wake my brother very easy"   Fatty liver    Flank pain    GERD (gastroesophageal reflux disease)    History of  bronchitis    History of chicken pox    HSV infection    ocular symptoms and oral lesions   Hypercholesterolemia    Hypertension    Kidney stones    Osteoarthritis    PAF (paroxysmal atrial fibrillation) (HCC)    a. Had post-op AF 08/2013; b. 09/2015 Holter: PAC's no AF; c. 05/2017 Noted to be in AFib-->Eliquis (CHA2DS2VASc = 7); d. 08/2017 s/p DCCV-->ERAF-->Tikosyn added; e. 08/2020 Zio: 4% Afib burden.   PFO (patent foramen ovale)    a. 02/2017 Echo: + PFO.   Sinus node dysfunction (HCC)    a. 08/2020 Zio: Predominantly sinus bradycardia at 53 (35-211).  20 SVT runs.  1 run of nonsustained VT x4 beats.  4% A-fib burden.  16 pauses lasting up to 3.6 seconds.  Intermittent junctional rhythm.  Patient declined pacemaker.   Past Surgical History:  Procedure Laterality Date   CARDIAC CATHETERIZATION  08/21/2013   DR Clifton James   CARDIOVERSION N/A 08/24/2017   Procedure: CARDIOVERSION;  Surgeon: Antonieta Iba, MD;  Location: [facility] ORS;  Service: Cardiovascular;  Laterality: N/A;   CHOLECYSTECTOMY  1983   COLONOSCOPY WITH PROPOFOL N/A 01/04/2023   Procedure: COLONOSCOPY WITH PROPOFOL;  Surgeon: Wyline Mood, MD;  Location: [facility] ENDOSCOPY;  Service: Gastroenterology;  Laterality: N/A;   CORONARY ARTERY BYPASS GRAFT N/A 08/23/2013   Procedure: CORONARY ARTERY BYPASS GRAFTING (CABG);  Surgeon: Delight Ovens, MD;  Location: [facility] OR;  Service: Open Heart Surgery;  Laterality: N/A;  CABG times six utilizing the left internal mammary artery and the right greater saphenous vein harvested endoscopically   CORONARY STENT INTERVENTION N/A 05/16/2022   Procedure: CORONARY STENT INTERVENTION;  Surgeon: Iran Ouch, MD;  Location: [facility] INVASIVE CV LAB;  Service: Cardiovascular;  Laterality: N/A;   ENDOVEIN HARVEST OF GREATER SAPHENOUS VEIN Right 08/23/2013   Procedure: ENDOVEIN HARVEST OF GREATER SAPHENOUS VEIN;  Surgeon: Delight Ovens, MD;  Location: [facility] OR;  Service: Open Heart Surgery;  Laterality:  Right;   ESOPHAGOGASTRODUODENOSCOPY (EGD) WITH PROPOFOL N/A 01/04/2023   Procedure: ESOPHAGOGASTRODUODENOSCOPY (EGD) WITH PROPOFOL;  Surgeon: Wyline Mood, MD;  Location[facility] ENDOSCOPY;  Service: Gastroenterology;  Laterality: N/A;   HERNIA REPAIR     X3   INTRAOPERATIVE TRANSESOPHAGEAL ECHOCARDIOGRAM N/A 08/23/2013   Procedure: INTRAOPERATIVE TRANSESOPHAGEAL ECHOCARDIOGRAM;  Surgeon: Delight Ovens, MD;  Location: [facility] OR;  Service: Open Heart Surgery;  Laterality: N/A;   LEFT HEART CATH AND CORS/GRAFTS ANGIOGRAPHY N/A 05/16/2022   Procedure: LEFT HEART CATH AND CORS/GRAFTS ANGIOGRAPHY;  Surgeon: Iran Ouch, MD;  Location: [facility] INVASIVE CV LAB;  Service: Cardiovascular;  Laterality: N/A;  LEFT HEART CATHETERIZATION WITH CORONARY ANGIOGRAM N/A 08/21/2013   Procedure: LEFT HEART CATHETERIZATION WITH CORONARY ANGIOGRAM;  Surgeon: Kathleene Hazel, MD;  Location: [facility] CATH LAB;  Service: Cardiovascular;  Laterality: N/A;   Family History  Problem Relation Age of Onset   Stroke Mother    Diabetes Mother    Heart disease Father        heart failure   Colon cancer Maternal Grandmother    Parkinsonism Maternal Grandfather    Stroke Paternal Grandfather    Prostate cancer Maternal Uncle    CAD Brother        CABG   Social History   Socioeconomic History   Marital status: Married    Spouse name: Not on file   Number of children: 2   Years of education: HS   Highest education level: Not on file  Occupational History   Occupation: Retired  Tobacco Use   Smoking status: Never   Smokeless tobacco: Never  Vaping Use   Vaping Use: Never used  Substance and Sexual Activity   Alcohol use: No    Alcohol/week: 0.0 standard drinks of alcohol   Drug use: No   Sexual activity: Not on file  Other Topics Concern   Not on file  Social History Narrative   Retired, remarried 1988   From [place], [place]   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   Right-handed.   2 cups caffeine daily   Social Determinants of Health   Financial Resource Strain: Low Risk  (12/14/2021)   Overall Financial Resource Strain (CARDIA)    Difficulty of Paying Living Expenses: Not hard at all  Food Insecurity: No Food Insecurity (02/06/2023)   Hunger Vital Sign    Worried About Running Out of Food in the Last Year: Never true    Ran Out of Food in the Last Year: Never true  Transportation Needs: No Transportation Needs (02/06/2023)   PRAPARE - Administrator, Civil Service (Medical): No    Lack of Transportation (Non-Medical): No  Physical Activity: Sufficiently Active (12/14/2021)   Exercise Vital Sign    Days of Exercise per Week: 5 days    Minutes of Exercise per Session: 60 min  Stress: No Stress Concern Present (12/14/2021)   [employer] of Occupational Health - Occupational Stress Questionnaire    Feeling of Stress : Not at all  Social Connections: Moderately Integrated (12/14/2021)   Social Connection and Isolation Panel [NHANES]    Frequency of Communication with Friends and Family: Three times a week    Frequency of Social Gatherings with Friends and Family: Twice a week    Attends Religious Services: Never    Database administrator or Organizations: Yes    Attends Engineer, structural: More than 4 times per year    Marital Status: Married    Tobacco Counseling Counseling given: Not Answered   Clinical Intake:                        Activities of Daily Living    09/24/2022    5:34 PM 09/24/2022    5:32 PM  In your present state of health, do you have any difficulty performing the following activities:  Vision?  0  Difficulty concentrating or making decisions?  0  Walking or climbing stairs?  0  Dressing or bathing?  0  Doing errands, shopping? 0  Patient Care Team: Joaquim Nam, MD as PCP - General (Family Medicine) Mariah Milling Tollie Pizza, MD as PCP - Cardiology  (Cardiology) Kathleene Hazel, MD as Consulting Physician (Cardiology) Paulla Dolly, MD as Consulting Physician (Ophthalmology) Bufford Buttner, MD as Consulting Physician (Dermatology) Mariah Milling Tollie Pizza, MD as Consulting Physician (Cardiology) Kathyrn Sheriff, [facility] (Inactive) as Pharmacist (Pharmacist) Elwyn Reach (Neurology)  Indicate any recent Medical Services you may have received from other than [facility] providers in the past year (date may be approximate).     Assessment:   This is a routine wellness examination for Parks.  Hearing/Vision screen No results found.  Dietary issues and exercise activities discussed:     Goals Addressed   None    Depression Screen    02/06/2023    2:45 PM 01/31/2023    9:19 AM 10/18/2022    9:14 AM 10/12/2022   12:03 PM 12/14/2021   11:50 AM 07/23/2020   11:24 AM 07/16/2020   10:49 AM  PHQ 2/9 Scores  PHQ - 2 Score 0 0 0 0 0 0 0  PHQ- 9 Score  3 0 2       Fall Risk    01/31/2023    9:19 AM 10/18/2022    9:14 AM 10/12/2022   12:03 PM 09/23/2022    7:52 AM 12/14/2021   11:47 AM  Fall Risk   Falls in the past year? 1 0 0 0 0  Number falls in past yr: 0 0 0 0 0  Injury with Fall? 1 0 0 0 0  Risk for fall due to : No Fall Risks No Fall Risks No Fall Risks    Follow up Falls evaluation completed Falls evaluation completed Falls evaluation completed Falls evaluation completed Falls evaluation completed;Education provided;Falls prevention discussed    MEDICARE RISK AT HOME:   TIMED UP AND GO:  Was the test performed?  No    Cognitive Function:    08/16/2017    2:30 PM 07/26/2016    8:25 AM  MMSE - Mini Mental State Exam  Orientation to time 5 5  Orientation to Place 5 5  Registration 3 3  Attention/ Calculation 0 0  Recall 3 3  Language- name 2 objects 0 0  Language- repeat 1 1  Language- follow 3 step command 3 3  Language- read & follow direction 0 0  Write a sentence 0 0  Copy design 0 0  Total score  20 20        Immunizations Immunization History  Administered Date(s) Administered   COVID-19, mRNA, vaccine(Comirnaty)12 years and older 06/01/2022   Fluad Quad(high Dose 65+) 05/21/2020   Influenza Split 06/11/2012, 06/08/2014   Influenza,inj,Quad PF,6+ Mos 07/24/2015, 05/31/2018   Influenza-Unspecified 05/08/2016, 05/19/2017, 05/03/2021   Moderna Covid-19 Vaccine Bivalent Booster 62yrs & up 05/18/2021   Moderna Sars-Covid-2 Vaccination 08/19/2019, 09/17/2019, 06/05/2020, 11/24/2020   Pfizer Covid-19 Vaccine Bivalent Booster 54yrs & up 03/16/2022   Pneumococcal Conjugate-13 07/14/2014   Pneumococcal Polysaccharide-23 06/11/2012   Td 08/08/2008   Tdap 10/26/2018    TDAP status: Up to date  Pneumococcal vaccine status: Up to date  Covid-19 vaccine status: Information provided on how to obtain vaccines.   Qualifies for Shingles Vaccine? Yes   Zostavax completed No   Shingrix Completed?: No.    Education has been provided regarding the importance of this vaccine. Patient has been advised to call insurance company to determine out of pocket expense if they have not yet  received this vaccine. Advised may also receive vaccine at local pharmacy or Health Dept. Verbalized acceptance and understanding.  Screening Tests Health Maintenance  Topic Date Due   Zoster Vaccines- Shingrix (1 of 2) Never done   Diabetic kidney evaluation - Urine ACR  06/03/2015   FOOT EXAM  11/04/2020   OPHTHALMOLOGY EXAM  06/01/2021   Medicare Annual Wellness (AWV)  12/15/2022   COVID-19 Vaccine (8 - 2023-24 season) 02/16/2023 (Originally 07/27/2022)   INFLUENZA VACCINE  03/09/2023   HEMOGLOBIN A1C  03/26/2023   Diabetic kidney evaluation - eGFR measurement  12/23/2023   DTaP/Tdap/Td (3 - Td or Tdap) 10/25/2028   Pneumonia Vaccine 68+ Years old  Completed   HPV VACCINES  Aged Out    Health Maintenance  Health Maintenance Due  Topic Date Due   Zoster Vaccines- Shingrix (1 of 2) Never done    Diabetic kidney evaluation - Urine ACR  06/03/2015   FOOT EXAM  11/04/2020   OPHTHALMOLOGY EXAM  06/01/2021   Medicare Annual Wellness (AWV)  12/15/2022    Colorectal cancer screening: No longer required.   Lung Cancer Screening: (Low Dose CT Chest recommended if Age 71-80 years, 20 pack-year currently smoking OR have quit w/in 15years.) does not qualify.   Lung Cancer Screening Referral: n/a  Additional Screening:  Hepatitis C Screening: does not qualify  Vision Screening: Recommended annual ophthalmology exams for early detection of glaucoma and other disorders of the eye. Is the patient up to date with their annual eye exam?  {YES/NO:21197} Who is the provider or what is the name of the office in which the patient attends annual eye exams? *** If pt is not established with a provider, would they like to be referred to a provider to establish care? {YES/NO:21197}.   Dental Screening: Recommended annual dental exams for proper oral hygiene  Diabetic Foot Exam: Diabetic Foot Exam: Overdue, Pt has been advised about the importance in completing this exam. Pt is scheduled for diabetic foot exam on at next office visit.  Community Resource Referral / Chronic Care Management: CRR required this visit?  {YES/NO:21197}  CCM required this visit?  {CCM Required [phone]}     Plan:     I have personally reviewed and noted the following in the patient's chart:   Medical and social history Use of alcohol, tobacco or illicit drugs  Current medications and supplements including opioid prescriptions. {Opioid Prescriptions:[phone]} Functional ability and status Nutritional status Physical activity Advanced directives List of other physicians Hospitalizations, surgeries, and ER visits in previous 12 months Vitals Screenings to include cognitive, depression, and falls Referrals and appointments  In addition, I have reviewed and discussed with patient certain preventive  protocols, quality metrics, and best practice recommendations. A written personalized care plan for preventive services as well as general preventive health recommendations were provided to patient.     Kandis Fantasia [place], [place]   4/0/9811   After Visit Summary: {CHL AMB AWV After Visit Summary:[phone]}  Nurse Notes: ***

## 2023-02-07 ENCOUNTER — Ambulatory Visit (INDEPENDENT_AMBULATORY_CARE_PROVIDER_SITE_OTHER): Payer: Medicare Other

## 2023-02-07 VITALS — Ht 67.0 in | Wt 156.0 lb

## 2023-02-07 DIAGNOSIS — Z0000 Encounter for general adult medical examination without abnormal findings: Secondary | ICD-10-CM

## 2023-02-10 ENCOUNTER — Other Ambulatory Visit (INDEPENDENT_AMBULATORY_CARE_PROVIDER_SITE_OTHER): Payer: Medicare Other

## 2023-02-10 ENCOUNTER — Other Ambulatory Visit: Payer: Medicare Other

## 2023-02-10 DIAGNOSIS — E1149 Type 2 diabetes mellitus with other diabetic neurological complication: Secondary | ICD-10-CM | POA: Diagnosis not present

## 2023-02-10 DIAGNOSIS — M810 Age-related osteoporosis without current pathological fracture: Secondary | ICD-10-CM

## 2023-02-10 DIAGNOSIS — Z13220 Encounter for screening for lipoid disorders: Secondary | ICD-10-CM | POA: Diagnosis not present

## 2023-02-10 LAB — MICROALBUMIN / CREATININE URINE RATIO
Creatinine,U: 95.4 mg/dL
Microalb Creat Ratio: 0.7 mg/g (ref 0.0–30.0)
Microalb, Ur: <0.7 mg/dL (ref 0.0–1.9)

## 2023-02-10 LAB — LIPID PANEL
Cholesterol: 147 mg/dL (ref 0–200)
HDL: 26.8 mg/dL — ABNORMAL LOW (ref >39.00)
Total CHOL/HDL Ratio: 5
Triglycerides: 531 mg/dL — ABNORMAL HIGH (ref 0.0–149.0)

## 2023-02-10 LAB — LDL CHOLESTEROL, DIRECT: Direct LDL: 67 mg/dL

## 2023-02-10 LAB — HEMOGLOBIN A1C: Hgb A1c MFr Bld: 7.9 % — ABNORMAL HIGH (ref 4.6–6.5)

## 2023-02-10 LAB — VITAMIN D 25 HYDROXY (VIT D DEFICIENCY, FRACTURES): VITD: 41.2 ng/mL (ref 30.00–100.00)

## 2023-02-17 ENCOUNTER — Ambulatory Visit (INDEPENDENT_AMBULATORY_CARE_PROVIDER_SITE_OTHER): Payer: Medicare Other | Admitting: Family Medicine

## 2023-02-17 ENCOUNTER — Encounter: Payer: Self-pay | Admitting: Family Medicine

## 2023-02-17 ENCOUNTER — Encounter: Payer: Self-pay | Admitting: *Deleted

## 2023-02-17 VITALS — BP 126/56 | HR 64 | Temp 98.3°F | Ht 67.0 in | Wt 159.0 lb

## 2023-02-17 DIAGNOSIS — H409 Unspecified glaucoma: Secondary | ICD-10-CM

## 2023-02-17 DIAGNOSIS — Z0000 Encounter for general adult medical examination without abnormal findings: Secondary | ICD-10-CM

## 2023-02-17 DIAGNOSIS — Z7189 Other specified counseling: Secondary | ICD-10-CM

## 2023-02-17 DIAGNOSIS — R0683 Snoring: Secondary | ICD-10-CM

## 2023-02-17 DIAGNOSIS — R413 Other amnesia: Secondary | ICD-10-CM

## 2023-02-17 DIAGNOSIS — Z7984 Long term (current) use of oral hypoglycemic drugs: Secondary | ICD-10-CM | POA: Diagnosis not present

## 2023-02-17 DIAGNOSIS — G720 Drug-induced myopathy: Secondary | ICD-10-CM

## 2023-02-17 DIAGNOSIS — E1149 Type 2 diabetes mellitus with other diabetic neurological complication: Secondary | ICD-10-CM | POA: Diagnosis not present

## 2023-02-17 DIAGNOSIS — I10 Essential (primary) hypertension: Secondary | ICD-10-CM | POA: Diagnosis not present

## 2023-02-17 MED ORDER — GABAPENTIN 100 MG PO CAPS: 100.0000 mg | ORAL_CAPSULE | Freq: Three times a day (TID) | ORAL | 2 refills | Status: DC

## 2023-02-17 MED ORDER — GABAPENTIN 100 MG PO CAPS: 100.0000 mg | ORAL_CAPSULE | Freq: Three times a day (TID) | ORAL | Status: DC

## 2023-02-17 NOTE — Progress Notes (Unsigned)
Diabetes:  Using medications without difficulties: yes Hypoglycemic episodes: no Hyperglycemic episodes: no Feet problems: burning pain in the feet.  He is off gabapentin.   Blood Sugars averaging: >150.   eye exam within last year: done, had seen Dr. Thurnell Lose in [facility].  Needs eval locally re: glaucoma.  Referral placed. Statin intolerant.  No change in meds. Recheck in 3 months.   Hypertension:    Using medication without problems or lightheadedness: yes Chest pain with exertion:no Edema:no Short of breath: mild, d/w pt.  He'll update me as needed.   Labs d/w pt.    Memory d/w pt. He noted changes since inpatient event.  No issues driving.  He is oriented to POTUS and time.  He didn't want to try short term testing today-he declined that.  We agreed to recheck in about 3 months.    He is going to see cardiology about OSA.  He is on the cancellation list.    Flu d/w pt.   Shingles prev done at [facility] per patient report.   PNA UTD Tetanus 2020 covid vaccine 2021 Colonoscopy 2018 PSA deferred  Advance directive- wife and daughter equally designated if patient were incapacitated.    PMH and SH reviewed  Meds, vitals, and allergies reviewed.   ROS: Per HPI unless specifically indicated in ROS section   GEN: nad, alert and oriented HEENT: mucous membranes moist NECK: supple w/o LA CV: rrr. PULM: ctab, no inc wob ABD: soft, +bs EXT: no edema SKIN: no acute rash

## 2023-02-17 NOTE — Patient Instructions (Addendum)
Restart gabapentin 100mg  . If needed gradually work up to 1 pill 3 times a day.  Then gradually increase to 2 pills 3 times a day if needed.    You should get a call about the eye clinic appointment.  Take care.  Glad to see you.  No change in diabetes meds. Recheck in 3 months- memory and sugar. A1c at the visit.

## 2023-02-19 DIAGNOSIS — G720 Drug-induced myopathy: Secondary | ICD-10-CM | POA: Insufficient documentation

## 2023-02-19 DIAGNOSIS — Z0000 Encounter for general adult medical examination without abnormal findings: Secondary | ICD-10-CM | POA: Insufficient documentation

## 2023-02-19 NOTE — Assessment & Plan Note (Signed)
Statin intolerant 

## 2023-02-19 NOTE — Assessment & Plan Note (Signed)
Continue spironolactone losartan isosorbide amlodipine.

## 2023-02-19 NOTE — Assessment & Plan Note (Signed)
  Flu d/w pt.   Shingles prev done at [facility] per patient report.   PNA UTD Tetanus 2020 covid vaccine 2021 Colonoscopy 2018 PSA deferred  Advance directive- wife and daughter equally designated if patient were incapacitated.

## 2023-02-19 NOTE — Assessment & Plan Note (Addendum)
No change in metformin for now. Recheck in 3 months.  Restart gabapentin for neuropathy pain with gradual escalation of dose, discussed with patient.  He agrees.  See orders.

## 2023-02-19 NOTE — Assessment & Plan Note (Signed)
Advance directive-wife and daughter equally designated if patient were incapacitated. 

## 2023-02-19 NOTE — Assessment & Plan Note (Signed)
He is going to follow-up with cardiology about sleep apnea.

## 2023-02-19 NOTE — Assessment & Plan Note (Signed)
Memory d/w pt. He noted changes since inpatient event.  No issues driving.  He is oriented to POTUS and time.  He didn't want to try short term testing today-he declined that.  We agreed to recheck in about 3 months.  He can update me as needed in the meantime.

## 2023-02-20 ENCOUNTER — Ambulatory Visit: Payer: Medicare Other | Attending: Internal Medicine | Admitting: Internal Medicine

## 2023-02-20 ENCOUNTER — Encounter: Payer: Self-pay | Admitting: Internal Medicine

## 2023-02-20 VITALS — BP 114/64 | HR 54 | Ht 67.0 in | Wt 163.0 lb

## 2023-02-20 DIAGNOSIS — I495 Sick sinus syndrome: Secondary | ICD-10-CM | POA: Diagnosis not present

## 2023-02-20 NOTE — Progress Notes (Signed)
HPI [name] returns today for followup. He is a pleasant 82 yo man with a h/o PAF who has been controlled with dofetilide. He also has HTN. 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 He has occasional break through palpitations. He gets lightheaded at times but denies syncope or near syncope. He has worn a cardiac monitor which demonstrates sinus bradycardia during the daytime in the 30's and daytime pauses of 3.5 seconds. He has NSVT. No sustained atrial fib though some brief NSSVT. He feels well except for some neuropathy. Allergies  Allergen Reactions   Fenofibrate Other (See Comments)    Muscle weakness/pain   Fire Ant     Allergic reaction   Statins Other (See Comments)    MYALGIA, muscle pain     Current Outpatient Medications  Medication Sig Dispense Refill   acyclovir (ZOVIRAX) 800 MG tablet TAKE 1 TABLET TWICE A DAY 180 tablet 3   amLODipine (NORVASC) 10 MG tablet Take 1 tablet (10 mg total) by mouth daily. 90 tablet 3   apixaban (ELIQUIS) 5 MG TABS tablet Take 1 tablet (5 mg total) by mouth 2 (two) times daily. 180 tablet 3   ciclopirox (PENLAC) 8 % solution Apply topically.     COD LIVER OIL PO Take 1 capsule by mouth 2 (two) times daily. 10 mcg     dofetilide (TIKOSYN) 500 MCG capsule Take 1 capsule (500 mcg total) by mouth 2 (two) times daily. 180 capsule 0   ezetimibe (ZETIA) 10 MG tablet TAKE 1 TABLET DAILY 90 tablet 0   gabapentin (NEURONTIN) 100 MG capsule Take 1-2 capsules (100-200 mg total) by mouth 3 (three) times daily. 100 capsule 2   isosorbide mononitrate (IMDUR) 30 MG 24 hr tablet Take 30 mg by mouth daily.     loratadine (CLARITIN) 10 MG tablet Take 1 tablet (10 mg total) by mouth daily.     losartan (COZAAR) 100 MG tablet TAKE 1 TABLET DAILY 90 tablet 0   metFORMIN (GLUCOPHAGE) 850 MG tablet Take 1 tablet (850 mg total) by mouth 2 (two) times daily with a meal. Resume 05/19/2022. 180 tablet 3    nitroGLYCERIN (NITROSTAT) 0.4 MG SL tablet Place 1 tablet (0.4 mg total) under the tongue every 5 (five) minutes as needed for chest pain (max 3 doses). 25 tablet 6   nystatin (MYCOSTATIN/NYSTOP) powder Apply 1 Application topically 2 (two) times daily. Use until resolved and then 2 extra days. 60 g 1   Omega-3 Fatty Acids (FISH OIL) 1000 MG CAPS Take 1 capsule by mouth 2 (two) times daily.     pantoprazole (PROTONIX) 40 MG tablet Take 1 tablet (40 mg total) by mouth daily. 90 tablet 3   spironolactone (ALDACTONE) 25 MG tablet Take 1 tablet (25 mg total) by mouth daily. 90 tablet 3   tamsulosin (FLOMAX) 0.4 MG CAPS capsule Take 0.4 mg by mouth daily.     timolol (BETIMOL) 0.5 % ophthalmic solution Place 1 drop into both eyes 2 (two) times daily.     No current facility-administered medications for this visit.     Past Medical History:  Diagnosis Date   Anxiety    Back pain    Coronary artery disease 08/21/2013   a. 08/2013 s/p CABG x 6; b. 05/2022 PCI: LAD 162m, 60d, D2 50, LCX 80ost, 71m, 73m/d, OM1 100, RCA 100p, VG->OM2->OM3 nl, VG->D2 30ost, 99d @ Diag insertion (2.25x15 Onyx Frontier  DES), VG->RCA nl, LIMA->LAD nl. EF 55-65%.   Diabetes mellitus    Diabetic neuropathy (HCC)    Diastolic dysfunction    a. 02/2017 Echo: EF 55-60%, no rwma, Gr1 DD, mildly dil Ao Root/RV. + PFO.   Diverticulosis    Diverticulosis    Embolic stroke (HCC)    a. 04/2017 MRI in setting of freq falls: small subacute infarction of bilat centrum semi-ovale consistent w/ embolic event-->Eliquis.   Erectile dysfunction    Family history of anesthesia complication    ' they cant wake my brother very easy"   Fatty liver    Flank pain    GERD (gastroesophageal reflux disease)    History of bronchitis    History of chicken pox    HSV infection    ocular symptoms and oral lesions   Hypercholesterolemia    Hypertension    Kidney stones    Osteoarthritis    PAF (paroxysmal atrial fibrillation) (HCC)    a. Had  post-op AF 08/2013; b. 09/2015 Holter: PAC's no AF; c. 05/2017 Noted to be in AFib-->Eliquis (CHA2DS2VASc = 7); d. 08/2017 s/p DCCV-->ERAF-->Tikosyn added; e. 08/2020 Zio: 4% Afib burden.   PFO (patent foramen ovale)    a. 02/2017 Echo: + PFO.   Sinus node dysfunction (HCC)    a. 08/2020 Zio: Predominantly sinus bradycardia at 53 (35-211).  20 SVT runs.  1 run of nonsustained VT x4 beats.  4% A-fib burden.  16 pauses lasting up to 3.6 seconds.  Intermittent junctional rhythm.  Patient declined pacemaker.    ROS:   All systems reviewed and negative except as noted in the HPI.   Past Surgical History:  Procedure Laterality Date   CARDIAC CATHETERIZATION  08/21/2013   DR Clifton James   CARDIOVERSION N/A 08/24/2017   Procedure: CARDIOVERSION;  Surgeon: Antonieta Iba, MD;  Location: [facility] ORS;  Service: Cardiovascular;  Laterality: N/A;   CHOLECYSTECTOMY  1983   COLONOSCOPY WITH PROPOFOL N/A 01/04/2023   Procedure: COLONOSCOPY WITH PROPOFOL;  Surgeon: Wyline Mood, MD;  Location: [facility] ENDOSCOPY;  Service: Gastroenterology;  Laterality: N/A;   CORONARY ARTERY BYPASS GRAFT N/A 08/23/2013   Procedure: CORONARY ARTERY BYPASS GRAFTING (CABG);  Surgeon: Delight Ovens, MD;  Location: [facility] OR;  Service: Open Heart Surgery;  Laterality: N/A;  CABG times six utilizing the left internal mammary artery and the right greater saphenous vein harvested endoscopically   CORONARY STENT INTERVENTION N/A 05/16/2022   Procedure: CORONARY STENT INTERVENTION;  Surgeon: Iran Ouch, MD;  Location: [facility] INVASIVE CV LAB;  Service: Cardiovascular;  Laterality: N/A;   ENDOVEIN HARVEST OF GREATER SAPHENOUS VEIN Right 08/23/2013   Procedure: ENDOVEIN HARVEST OF GREATER SAPHENOUS VEIN;  Surgeon: Delight Ovens, MD;  Location: [facility] OR;  Service: Open Heart Surgery;  Laterality: Right;   ESOPHAGOGASTRODUODENOSCOPY (EGD) WITH PROPOFOL N/A 01/04/2023   Procedure: ESOPHAGOGASTRODUODENOSCOPY (EGD) WITH PROPOFOL;  Surgeon: Wyline Mood,  MD;  Location: [facility] ENDOSCOPY;  Service: Gastroenterology;  Laterality: N/A;   HERNIA REPAIR     X3   INTRAOPERATIVE TRANSESOPHAGEAL ECHOCARDIOGRAM N/A 08/23/2013   Procedure: INTRAOPERATIVE TRANSESOPHAGEAL ECHOCARDIOGRAM;  Surgeon: Delight Ovens, MD;  Location: [facility] OR;  Service: Open Heart Surgery;  Laterality: N/A;   LEFT HEART CATH AND CORS/GRAFTS ANGIOGRAPHY N/A 05/16/2022   Procedure: LEFT HEART CATH AND CORS/GRAFTS ANGIOGRAPHY;  Surgeon: Iran Ouch, MD;  Location: [facility] INVASIVE CV LAB;  Service: Cardiovascular;  Laterality: N/A;   LEFT HEART CATHETERIZATION WITH CORONARY ANGIOGRAM N/A 08/21/2013   Procedure: LEFT  HEART CATHETERIZATION WITH CORONARY ANGIOGRAM;  Surgeon: Kathleene Hazel, MD;  Location: [facility] CATH LAB;  Service: Cardiovascular;  Laterality: N/A;     Family History  Problem Relation Age of Onset   Stroke Mother    Diabetes Mother    Heart disease Father        heart failure   Colon cancer Maternal Grandmother    Parkinsonism Maternal Grandfather    Stroke Paternal Grandfather    Prostate cancer Maternal Uncle    CAD Brother        CABG     Social History   Socioeconomic History   Marital status: Married    Spouse name: Not on file   Number of children: 2   Years of education: HS   Highest education level: Not on file  Occupational History   Occupation: Retired  Tobacco Use   Smoking status: Never   Smokeless tobacco: Never  Vaping Use   Vaping status: Never Used  Substance and Sexual Activity   Alcohol use: No    Alcohol/week: 0.0 standard drinks of alcohol   Drug use: No   Sexual activity: Not on file  Other Topics Concern   Not on file  Social History Narrative   Retired, remarried 1988   From [place], [place]   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   Right-handed.   2 cups caffeine daily   Social Determinants of Health   Financial Resource Strain: Low Risk  (02/07/2023)    Overall Financial Resource Strain (CARDIA)    Difficulty of Paying Living Expenses: Not hard at all  Food Insecurity: No Food Insecurity (02/07/2023)   Hunger Vital Sign    Worried About Running Out of Food in the Last Year: Never true    Ran Out of Food in the Last Year: Never true  Transportation Needs: No Transportation Needs (02/07/2023)   PRAPARE - Administrator, Civil Service (Medical): No    Lack of Transportation (Non-Medical): No  Physical Activity: Sufficiently Active (02/07/2023)   Exercise Vital Sign    Days of Exercise per Week: 5 days    Minutes of Exercise per Session: 60 min  Stress: No Stress Concern Present (02/07/2023)   [employer] of Occupational Health - Occupational Stress Questionnaire    Feeling of Stress : Not at all  Social Connections: Socially Integrated (02/07/2023)   Social Connection and Isolation Panel [NHANES]    Frequency of Communication with Friends and Family: More than three times a week    Frequency of Social Gatherings with Friends and Family: Three times a week    Attends Religious Services: More than 4 times per year    Active Member of Clubs or Organizations: Yes    Attends Banker Meetings: More than 4 times per year    Marital Status: Married  Catering manager Violence: Not At Risk (02/07/2023)   Humiliation, Afraid, Rape, and Kick questionnaire    Fear of Current or Ex-Partner: No    Emotionally Abused: No    Physically Abused: No    Sexually Abused: No     BP 114/64   Pulse (!) 54   Ht 5\' 7"  (1.702 m)   Wt 163 lb (73.9 kg)   SpO2 95%   BMI 25.53 kg/m   Physical Exam:  Well appearing NAD HEENT: Unremarkable Neck:  No JVD, no thyromegally Lymphatics:  No adenopathy Back:  No CVA tenderness Lungs:  Clear with no wheezes HEART:  Regular rate rhythm, no murmurs, no rubs, no clicks Abd:  soft, positive bowel sounds, no organomegally, no rebound, no guarding Ext:  2 plus pulses, no edema, no cyanosis, no  clubbing Skin:  No rashes no nodules Neuro:  CN II through XII intact, motor grossly intact   Assess/Plan:  PAF - He is maintaining NSR. He will continue dofetilide 2. HTN - I have reviewed his bp's and they are better. He will continue norvasc. 3. CAD - he is s/p CABG. He remains active and has no chest pain. 4. Dyslipidemia - he will continue his Zetia. 5. Sinus node dysfunction - I discussed the issues of progressive sinus node dysfunction. He is likely to worsen but the time course is unclear. I have recommended watchful waiting. If he falls unexpectedly again, then PPM insertion will be required. He will call if this occurs. Also, I told him to call us if he feels weak and his HR is less than 50/min.  Sharlot Gowda Lindell Tussey,MD

## 2023-02-20 NOTE — Patient Instructions (Signed)
Medication Instructions:  No changes *If you need a refill on your cardiac medications before your next appointment, please call your pharmacy*   Lab Work: none If you have labs (blood work) drawn today and your tests are completely normal, you will receive your results only by: MyChart Message (if you have MyChart) OR A paper copy in the mail If you have any lab test that is abnormal or we need to change your treatment, we will call you to review the results.   Testing/Procedures: none   Follow-Up: At [facility], you and your health needs are our priority.  As part of our continuing mission to provide you with exceptional heart care, we have created designated Provider Care Teams.  These Care Teams include your primary Cardiologist (physician) and Advanced Practice Providers (APPs -  Physician Assistants and Nurse Practitioners) who all work together to provide you with the care you need, when you need it.   Your next appointment:   12 month(s)  Provider:   Lewayne Bunting, MD  Other Instructions Please call office if you are feeling poorly and your heart rate is less than 50.

## 2023-03-01 ENCOUNTER — Telehealth: Payer: Self-pay | Admitting: Cardiovascular Disease

## 2023-03-01 NOTE — Telephone Encounter (Signed)
Calling with his bp numbers  135/75 HR 61 124/70 HR 86 160/71 HR 52 159/77 HR 57 130/86 HR 75 129/72 HR 85 139/68 HR 59 121/60 HR 66 121/67 HR 64 113/63 HR 80 111/67 HR 77 (this was this morning).  Please advise

## 2023-03-01 NOTE — Telephone Encounter (Signed)
Overall, blood pressure readings are well-controlled with a isolated BP of 159 and 160 systolic.  Heart rates well-controlled.  Continue current medical therapy as directed by Ward Givens, NP and follow-up as scheduled next month.

## 2023-03-02 NOTE — Telephone Encounter (Signed)
Recommend OV with Ward Givens, NP to further discuss.

## 2023-03-02 NOTE — Telephone Encounter (Signed)
Spoke with patient and encouraged him to keep appointment with provider to further discuss his concerns. He verbalized understanding with no further questions.

## 2023-03-02 NOTE — Telephone Encounter (Signed)
Reviewed provider reply and instructions in regards to his blood pressures . He then inquired why he has had this new onset of shortness of breath and when he walks from his room to rest room becomes very out of breath. Advised that I would need to relay this to provider and will give him a call back. He verbalized understanding.

## 2023-03-08 ENCOUNTER — Encounter (INDEPENDENT_AMBULATORY_CARE_PROVIDER_SITE_OTHER): Payer: Self-pay

## 2023-03-14 ENCOUNTER — Other Ambulatory Visit: Payer: Self-pay | Admitting: Cardiovascular Disease

## 2023-03-20 ENCOUNTER — Telehealth: Payer: Self-pay | Admitting: Cardiovascular Disease

## 2023-03-20 NOTE — Telephone Encounter (Signed)
Spoke with patient to discuss his SOB and feeling like passing out. Patient states he has been SOB for 3 weeks now and sometimes notices his O2 levels are 88% but recover to 96%. Patient denies swelling in his legs or chest pain. Patient states he has been staying hydrated. Patient advised of ED precautions and advised to be seen at urgent care or ED should his symptoms progress or he becomes severely SOB before appointment on Friday.

## 2023-03-21 ENCOUNTER — Ambulatory Visit: Payer: Medicare Other | Admitting: Gastroenterology

## 2023-03-21 ENCOUNTER — Encounter: Payer: Self-pay | Admitting: Gastroenterology

## 2023-03-21 VITALS — BP 137/56 | HR 75 | Temp 98.1°F | Wt 163.0 lb

## 2023-03-21 DIAGNOSIS — K5900 Constipation, unspecified: Secondary | ICD-10-CM

## 2023-03-21 NOTE — Progress Notes (Signed)
Wyline Mood MD, MRCP(U.K) [address]  [address]  [place], [place] [address]  Main: [phone]  Fax: [phone]   Primary Care Physician: Joaquim Nam, MD  Primary Gastroenterologist:  Dr. Wyline Mood   Chief Complaint  Patient presents with   Follow-up    Abdominal pain    HPI: [name] [name] is a 81 y.o. male    Summary of history : Initially referred and seen back in 04/2022 for weight loss, abdominal pain for many years recently he had lost about 45 pounds of weight. Points to his entire abdomen where he gets pain pretty much the whole day no clear aggravating factors sometimes relieved with a bowel movement. He showed me pictures of his bowel movements it appears fragmented with very thin caliber. Denies any blood in his stool. Denies any NSAID use. He does admit he has to strain at times during the day.    Interval history 04/28/2022-03/21/2023   05/11/2022: Ct abdomen and pelvis: Diverticulosis of colon.  01/04/2023: EGD: Normal , colonoscopy - 5 diminutive polyps resected-tubular adenomas .   Since his last that he has been doing really well.  Takes his Linzess every day has regular bowel movements.  The weight loss that he reported last visit has resolved he has gained 12 or 13 pounds since then no GI complaints.   Current Outpatient Medications  Medication Sig Dispense Refill   acyclovir (ZOVIRAX) 800 MG tablet TAKE 1 TABLET TWICE A DAY 180 tablet 3   amLODipine (NORVASC) 10 MG tablet Take 1 tablet (10 mg total) by mouth daily. 90 tablet 3   apixaban (ELIQUIS) 5 MG TABS tablet Take 1 tablet (5 mg total) by mouth 2 (two) times daily. 180 tablet 3   ciclopirox (PENLAC) 8 % solution Apply topically.     COD LIVER OIL PO Take 1 capsule by mouth 2 (two) times daily. 10 mcg     dofetilide (TIKOSYN) 500 MCG capsule Take 1 capsule (500 mcg total) by mouth 2 (two) times daily. 180 capsule 0   ezetimibe (ZETIA) 10 MG tablet TAKE 1 TABLET DAILY 90 tablet 0    gabapentin (NEURONTIN) 100 MG capsule Take 1-2 capsules (100-200 mg total) by mouth 3 (three) times daily. 100 capsule 2   isosorbide mononitrate (IMDUR) 30 MG 24 hr tablet Take 30 mg by mouth daily.     loratadine (CLARITIN) 10 MG tablet Take 1 tablet (10 mg total) by mouth daily.     losartan (COZAAR) 100 MG tablet TAKE 1 TABLET DAILY 90 tablet 0   metFORMIN (GLUCOPHAGE) 850 MG tablet Take 1 tablet (850 mg total) by mouth 2 (two) times daily with a meal. Resume 05/19/2022. 180 tablet 3   nitroGLYCERIN (NITROSTAT) 0.4 MG SL tablet Place 1 tablet (0.4 mg total) under the tongue every 5 (five) minutes as needed for chest pain (max 3 doses). 25 tablet 6   nystatin (MYCOSTATIN/NYSTOP) powder Apply 1 Application topically 2 (two) times daily. Use until resolved and then 2 extra days. 60 g 1   Omega-3 Fatty Acids (FISH OIL) 1000 MG CAPS Take 1 capsule by mouth 2 (two) times daily.     pantoprazole (PROTONIX) 40 MG tablet Take 1 tablet (40 mg total) by mouth daily. 90 tablet 3   spironolactone (ALDACTONE) 25 MG tablet Take 1 tablet (25 mg total) by mouth daily. 90 tablet 3   tamsulosin (FLOMAX) 0.4 MG CAPS capsule Take 0.4 mg by mouth daily.  timolol (BETIMOL) 0.5 % ophthalmic solution Place 1 drop into both eyes 2 (two) times daily.     No current facility-administered medications for this visit.    Allergies as of 03/21/2023 - Review Complete 03/21/2023  Allergen Reaction Noted   Fenofibrate Other (See Comments) 03/22/2017   Fire ant  11/26/2019   Statins Other (See Comments) 12/08/2011     ROS:  General: Negative for anorexia, weight loss, fever, chills, fatigue, weakness. ENT: Negative for hoarseness, difficulty swallowing , nasal congestion. CV: Negative for chest pain, angina, palpitations, dyspnea on exertion, peripheral edema.  Respiratory: Negative for dyspnea at rest, dyspnea on exertion, cough, sputum, wheezing.  GI: See history of present illness. GU:  Negative for dysuria,  hematuria, urinary incontinence, urinary frequency, nocturnal urination.  Endo: Negative for unusual weight change.    Physical Examination:   BP (!) 137/56   Pulse 75   Temp 98.1 F (36.7 C) (Oral)   Wt 163 lb (73.9 kg)   BMI 25.53 kg/m   General: Well-nourished, well-developed in no acute distress.  Eyes: No icterus. Conjunctivae pink. Neuro: Alert and oriented x 3.  Grossly intact. Skin: Warm and dry, no jaundice.   Psych: Alert and cooperative, normal mood and affect.   Imaging Studies: No results found.  Assessment and Plan:   [name] [name] is a 82 y.o. y/o male here to follow up for abdominal and unintentional weight loss in 2023.  Since his last visit all his symptoms have resolved with Linzess and he has gained about 12 to 13 pounds of weight.  EGD colonoscopy and CT scan of the abdomen pelvis showed no gross abnormalities.   Plan 1.  Follow-up as needed    Dr Wyline Mood  MD,MRCP [facility]) Follow up in as needed

## 2023-03-22 ENCOUNTER — Ambulatory Visit: Payer: Medicare Other | Admitting: Nurse Practitioner

## 2023-03-22 NOTE — Progress Notes (Signed)
Cardiology Office Note  Date:  03/24/2023   ID:  [name], [name] [name] 12, [name], MRN [id number]  PCP:  [name] Nam, MD   Chief Complaint  Patient presents with   Follow-up    Patient reports experiencing SOBr with weakness for past 3-4 weeks.    HPI:  Mr. Rindal is a 82 year old gentleman with past medical history of Hypertension Diabetes Coronary artery disease, bypass surgery 6  2015 Ejection fraction 55% by echo July 2018 mildly dilated aortic root, PFO Previous strokes,  atrial fib, on eliquis, CHA2DS2VASc = 7);  Cardioversion 08/24/2017 admitted February 2019  placed on dofetilide and reverted back to NSR.  PCI of vein graft to the second diagonal October 2023 Who presents for follow-up of his coronary artery disease,atrial fibrillation, bradycardia/pauses, nonsustained VT  Last seen by myself in clinic September 2022 Last seen by one of our providers May and July 2024  cardiac monitor February 2022 which demonstrates sinus bradycardia during the daytime in the 30's and daytime pauses of 3.5 seconds. He has NSVT and SVT with 4% A-fib burden  Seen by Dr. Ladona Ridgel Recommendation made for watchful waiting, may need pacer for weakness, heart rates less than 50s, unexplained falls  Prior ischemic workup cardiac catheterization October 2023 Patent grafts, angioplasty and stent placement to anastomosis of vein graft to second diagonal  Labs: LDL 67 A1c 7.9 trending upwards Total cholesterol 147 up from 112  triglycerides 531  In follow-up today reports over the past several weeks has been having Some episodes with walking , gets tired, diaphoretic Up a hill, gets tired,  episodes come and go but typically when standing and with exertion When he has these episodes reports heart rate typically is not slow, blood pressure not low  Weight stable Denies leg swelling, no chest pain concerning for angina  EKG personally reviewed by myself on todays visit EKG  Interpretation Date/Time:  Friday March 24 2023 09:03:41 EDT Ventricular Rate:  61 PR Interval:  220 QRS Duration:  98 QT Interval:  462 QTC Calculation: 465 R Axis:   -25  Text Interpretation: Sinus rhythm with sinus arrhythmia with 1st degree A-V block Low voltage QRS Inferior infarct , age undetermined When compared with ECG of 23-Dec-2022 06:35, PREVIOUS ECG IS PRESENT Confirmed by Julien Nordmann [phone]) on 03/24/2023 9:04:45 AM   Other past medical history reviewed  HCTZ started 06/2020, stopped , "made him feel dizzy"  Episode of syncope 2021  09/03/19: decreased coreg to 3.125 for near syncope, syncope, dizziness, bradycardia zio ordered (as below)  Dr. Ladona Ridgel suggested holding coreg: Stopped coreg 09/06/20  Started hydralazine for BP >150, started BID  zio monitor 09/02/20 - 09/16/2020 For last week of zio was not on carvedilol Normal sinus rhythm wih paroxysmal atrial fibrillation Patient had a min HR of 35 bpm, max HR of 211 bpm, and avg HR of 53 bpm.    1 run of Ventricular Tachycardia occurred lasting 4 beats with a max rate of 211 bpm (avg 160  bpm).    20 Supraventricular Tachycardia runs occurred, the run with the fastest interval lasting 5 beats with a max rate of 125 bpm, the longest lasting 14.1 secs with an avg rate of 93 bpm.    Atrial Fibrillation occurred (4% burden), ranging from 37-117 bpm (avg of 64 bpm), the longest lasting 13 hours 52 mins with an avg rate of 64 bpm.    16 Pauses occurred, the longest lasting 3.6 secs (17 bpm).    Junctional  Rhythm was present. Isolated SVEs were occasional (3.8%, [id number]), SVE Couplets were occasional (1.5%,  7917), and SVE Triplets were rare (<1.0%, 571).  Isolated VEs were rare (<1.0%), VE Couplets were rare (<1.0%), and no VE Triplets were present.    Atrial fibrillation Early 2019, With straining, lifting, heavy lifting, had atrial fib  history of coronary disease and bypass 2015 Long history of Statins  myalgia:   PMH:   has a past medical history of Anxiety, Back pain, Coronary artery disease (08/21/2013), Diabetes mellitus, Diabetic neuropathy (HCC), Diastolic dysfunction, Diverticulosis, Diverticulosis, Embolic stroke (HCC), Erectile dysfunction, Family history of anesthesia complication, Fatty liver, Flank pain, GERD (gastroesophageal reflux disease), History of bronchitis, History of chicken pox, HSV infection, Hypercholesterolemia, Hypertension, Kidney stones, Osteoarthritis, PAF (paroxysmal atrial fibrillation) (HCC), PFO (patent foramen ovale), and Sinus node dysfunction (HCC).  PSH:    Past Surgical History:  Procedure Laterality Date   CARDIAC CATHETERIZATION  08/21/2013   DR Clifton James   CARDIOVERSION N/A 08/24/2017   Procedure: CARDIOVERSION;  Surgeon: Antonieta Iba, MD;  Location: [facility] ORS;  Service: Cardiovascular;  Laterality: N/A;   CHOLECYSTECTOMY  1983   COLONOSCOPY WITH PROPOFOL N/A 01/04/2023   Procedure: COLONOSCOPY WITH PROPOFOL;  Surgeon: Wyline Mood, MD;  Location: [facility] ENDOSCOPY;  Service: Gastroenterology;  Laterality: N/A;   CORONARY ARTERY BYPASS GRAFT N/A 08/23/2013   Procedure: CORONARY ARTERY BYPASS GRAFTING (CABG);  Surgeon: Delight Ovens, MD;  Location[facility];  Service: Open Heart Surgery;  Laterality: N/A;  CABG times six utilizing the left internal mammary artery and the right greater saphenous vein harvested endoscopically   CORONARY STENT INTERVENTION N/A 05/16/2022   Procedure: CORONARY STENT INTERVENTION;  Surgeon: Iran Ouch, MD;  Location: [facility] INVASIVE CV LAB;  Service: Cardiovascular;  Laterality: N/A;   ENDOVEIN HARVEST OF GREATER SAPHENOUS VEIN Right 08/23/2013   Procedure: ENDOVEIN HARVEST OF GREATER SAPHENOUS VEIN;  Surgeon: Delight Ovens, MD;  Location: [facility] OR;  Service: Open Heart Surgery;  Laterality: Right;   ESOPHAGOGASTRODUODENOSCOPY (EGD) WITH PROPOFOL N/A 01/04/2023   Procedure: ESOPHAGOGASTRODUODENOSCOPY (EGD) WITH PROPOFOL;   Surgeon: Wyline Mood, MD;  Location: [facility] ENDOSCOPY;  Service: Gastroenterology;  Laterality: N/A;   HERNIA REPAIR     X3   INTRAOPERATIVE TRANSESOPHAGEAL ECHOCARDIOGRAM N/A 08/23/2013   Procedure: INTRAOPERATIVE TRANSESOPHAGEAL ECHOCARDIOGRAM;  Surgeon: Delight Ovens, MD;  Location: [facility] OR;  Service: Open Heart Surgery;  Laterality: N/A;   LEFT HEART CATH AND CORS/GRAFTS ANGIOGRAPHY N/A 05/16/2022   Procedure: LEFT HEART CATH AND CORS/GRAFTS ANGIOGRAPHY;  Surgeon: Iran Ouch, MD;  Location: [facility] INVASIVE CV LAB;  Service: Cardiovascular;  Laterality: N/A;   LEFT HEART CATHETERIZATION WITH CORONARY ANGIOGRAM N/A 08/21/2013   Procedure: LEFT HEART CATHETERIZATION WITH CORONARY ANGIOGRAM;  Surgeon: Kathleene Hazel, MD;  Location[facility] CATH LAB;  Service: Cardiovascular;  Laterality: N/A;    Current Outpatient Medications  Medication Sig Dispense Refill   acyclovir (ZOVIRAX) 800 MG tablet TAKE 1 TABLET TWICE A DAY 180 tablet 3   amLODipine (NORVASC) 10 MG tablet Take 1 tablet (10 mg total) by mouth daily. 90 tablet 3   apixaban (ELIQUIS) 5 MG TABS tablet Take 1 tablet (5 mg total) by mouth 2 (two) times daily. 180 tablet 3   ciclopirox (PENLAC) 8 % solution Apply topically.     COD LIVER OIL PO Take 1 capsule by mouth 2 (two) times daily. 10 mcg     dofetilide (TIKOSYN) 500 MCG capsule Take 1 capsule (500 mcg total) by mouth  2 (two) times daily. 180 capsule 0   ezetimibe (ZETIA) 10 MG tablet TAKE 1 TABLET DAILY 90 tablet 0   gabapentin (NEURONTIN) 100 MG capsule Take 1-2 capsules (100-200 mg total) by mouth 3 (three) times daily. 100 capsule 2   isosorbide mononitrate (IMDUR) 30 MG 24 hr tablet Take 30 mg by mouth daily.     loratadine (CLARITIN) 10 MG tablet Take 1 tablet (10 mg total) by mouth daily.     losartan (COZAAR) 100 MG tablet TAKE 1 TABLET DAILY 90 tablet 0   metFORMIN (GLUCOPHAGE) 850 MG tablet Take 1 tablet (850 mg total) by mouth 2 (two) times daily with a meal. Resume  05/19/2022. 180 tablet 3   nitroGLYCERIN (NITROSTAT) 0.4 MG SL tablet Place 1 tablet (0.4 mg total) under the tongue every 5 (five) minutes as needed for chest pain (max 3 doses). 25 tablet 6   nystatin (MYCOSTATIN/NYSTOP) powder Apply 1 Application topically 2 (two) times daily. Use until resolved and then 2 extra days. 60 g 1   Omega-3 Fatty Acids (FISH OIL) 1000 MG CAPS Take 1 capsule by mouth 2 (two) times daily.     pantoprazole (PROTONIX) 40 MG tablet Take 1 tablet (40 mg total) by mouth daily. 90 tablet 3   tamsulosin (FLOMAX) 0.4 MG CAPS capsule Take 0.4 mg by mouth daily.     timolol (BETIMOL) 0.5 % ophthalmic solution Place 1 drop into both eyes 2 (two) times daily.     spironolactone (ALDACTONE) 25 MG tablet Take 1 tablet (25 mg total) by mouth daily. 90 tablet 3   No current facility-administered medications for this visit.    Allergies:   Fenofibrate, Fire ant, and Statins   Social History:  The patient  reports that he has never smoked. He has never used smokeless tobacco. He reports that he does not drink alcohol and does not use drugs.   Family History:   family history includes CAD in his brother; Colon cancer in his maternal grandmother; Diabetes in his mother; Heart disease in his father; Parkinsonism in his maternal grandfather; Prostate cancer in his maternal uncle; Stroke in his mother and paternal grandfather.    Review of Systems: Review of Systems  Constitutional: Negative.   HENT: Negative.    Respiratory: Negative.    Cardiovascular: Negative.   Gastrointestinal: Negative.   Musculoskeletal: Negative.        Leg pain, foot pain  Neurological: Negative.   Psychiatric/Behavioral: Negative.    All other systems reviewed and are negative.   PHYSICAL EXAM: VS:  BP (!) 120/54 (BP Location: Left Arm, Patient Position: Sitting, Cuff Size: Normal)   Pulse 61   Ht 5\' 7"  (1.702 m)   Wt 161 lb (73 kg)   SpO2 96%   BMI 25.22 kg/m  , BMI Body mass index is 25.22  kg/m. Constitutional:  oriented to person, place, and time. No distress.  HENT:  Head: Grossly normal Eyes:  no discharge. No scleral icterus.  Neck: No JVD, no carotid bruits  Cardiovascular: Regular rate and rhythm, no murmurs appreciated Pulmonary/Chest: Clear to auscultation bilaterally, no wheezes or rails Abdominal: Soft.  no distension.  no tenderness.  Musculoskeletal: Normal range of motion Neurological:  normal muscle tone. Coordination normal. No atrophy Skin: Skin warm and dry Psychiatric: normal affect, pleasant  Recent Labs: 09/24/2022: Magnesium 2.2; TSH 2.566 12/23/2022: ALT 25; BUN 17; Creatinine, Ser 0.89; Hemoglobin 15.3; Platelets 149; Potassium 3.7; Sodium 135   Lipid Panel Lab Results  Component Value Date   CHOL 147 02/10/2023   HDL 26.80 (L) 02/10/2023   LDLCALC 62 06/07/2017   TRIG (H) 02/10/2023    531.0 Triglyceride is over 400; calculations on Lipids are invalid.     Wt Readings from Last 3 Encounters:  03/24/23 161 lb (73 kg)  03/21/23 163 lb (73.9 kg)  02/20/23 163 lb (73.9 kg)    ASSESSMENT AND PLAN:  Persistent atrial fibrillation (HCC) Managed by Dr. 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 Compliant with his Eliquis 5 twice daily on Tikosyn, no B-blocker secondary to bradycardia Having episodes of fatigue, diaphoresis typically when walking, comes and goes Zio monitor ordered given prior history of paroxysmal A-fib, pauses, bradycardia   Sinus bradycardia Off coreg/b-blocker Zio monitor as above  Atherosclerosis of native coronary artery of native heart with stable angina pectoris (HCC) On zetia, total cholesterol climbing as is the A1c Statin intolerance, no changes made He prefers to repeat and see if he can get his numbers down Additional options for cholesterol include adding bempedoic acid or adding PCSK9 inhibitor  S/P CABG x 6 Currently with no symptoms of angina. No further workup at this time. Continue current  medication regimen.  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 No changes made to the medications.  Neuropathy bilateral foot pain Stable   Orders Placed This Encounter  Procedures   LONG TERM MONITOR (3-14 DAYS)   EKG 12-Lead    Total encounter time more than 30 minutes  Greater than 50% was spent in counseling and coordination of care with the patient   Signed, Dossie Arbour, M.D., Ph.D. 03/24/2023  [facility] [place], [place] [phone]

## 2023-03-24 ENCOUNTER — Ambulatory Visit: Payer: Medicare Other | Attending: Nurse Practitioner | Admitting: Cardiovascular Disease

## 2023-03-24 ENCOUNTER — Ambulatory Visit (INDEPENDENT_AMBULATORY_CARE_PROVIDER_SITE_OTHER): Payer: Medicare Other

## 2023-03-24 ENCOUNTER — Encounter: Payer: Self-pay | Admitting: Cardiovascular Disease

## 2023-03-24 VITALS — BP 120/54 | HR 61 | Ht 67.0 in | Wt 161.0 lb

## 2023-03-24 DIAGNOSIS — I480 Paroxysmal atrial fibrillation: Secondary | ICD-10-CM

## 2023-03-24 DIAGNOSIS — Z7984 Long term (current) use of oral hypoglycemic drugs: Secondary | ICD-10-CM

## 2023-03-24 DIAGNOSIS — R001 Bradycardia, unspecified: Secondary | ICD-10-CM

## 2023-03-24 DIAGNOSIS — E119 Type 2 diabetes mellitus without complications: Secondary | ICD-10-CM

## 2023-03-24 DIAGNOSIS — I25810 Atherosclerosis of coronary artery bypass graft(s) without angina pectoris: Secondary | ICD-10-CM | POA: Diagnosis not present

## 2023-03-24 DIAGNOSIS — I495 Sick sinus syndrome: Secondary | ICD-10-CM

## 2023-03-24 DIAGNOSIS — R42 Dizziness and giddiness: Secondary | ICD-10-CM

## 2023-03-24 DIAGNOSIS — I10 Essential (primary) hypertension: Secondary | ICD-10-CM

## 2023-03-24 DIAGNOSIS — E785 Hyperlipidemia, unspecified: Secondary | ICD-10-CM | POA: Diagnosis not present

## 2023-03-24 NOTE — Patient Instructions (Addendum)
Medication Instructions:  No changes  If you need a refill on your cardiac medications before your next appointment, please call your pharmacy.   Lab work: No new labs needed  Testing/Procedures: ZIO XT- Long Term Monitor Instructions  Your physician has requested you wear a ZIO patch monitor for 14 days.  This is a single patch monitor. [employer] supplies one patch monitor per enrollment. Additional stickers are not available. Please do not apply patch if you will be having a Nuclear Stress Test,  Echocardiogram, Cardiac CT, MRI, or Chest Xray during the period you would be wearing the  monitor. The patch cannot be worn during these tests. You cannot remove and re-apply the  ZIO XT patch monitor.  Your ZIO patch monitor will be mailed 3 day USPS to your address on file. It may take 3-5 days  to receive your monitor after you have been enrolled.  Once you have received your monitor, please review the enclosed instructions. Your monitor  has already been registered assigning a specific monitor serial # to you.  Billing and Patient Assistance Program Information  We have supplied [employer] with any of your insurance information on file for billing purposes. [employer] offers a sliding scale Patient Assistance Program for patients that do not have  insurance, or whose insurance does not completely cover the cost of the ZIO monitor.  You must apply for the Patient Assistance Program to qualify for this discounted rate.  To apply, please call [employer] at [phone], select option 4, select option 2, ask to apply for  Patient Assistance Program. Meredeth Ide will ask your household income, and how many people  are in your household. They will quote your out-of-pocket cost based on that information.  [employer] will also be able to set up a 26-month, interest-free payment plan if needed.  Applying the monitor   Shave hair from upper left chest.  Hold abrader disc by orange tab. Rub abrader in 40  strokes over the upper left chest as  indicated in your monitor instructions.  Clean area with 4 enclosed alcohol pads. Let dry.  Apply patch as indicated in monitor instructions. Patch will be placed under collarbone on left  side of chest with arrow pointing upward.  Rub patch adhesive wings for 2 minutes. Remove white label marked "1". Remove the white  label marked "2". Rub patch adhesive wings for 2 additional minutes.  While looking in a mirror, press and release button in center of patch. A small green light will  flash 3-4 times. This will be your only indicator that the monitor has been turned on.  Do not shower for the first 24 hours. You may shower after the first 24 hours.  Press the button if you feel a symptom. You will hear a small click. Record Date, Time and  Symptom in the Patient Logbook.  When you are ready to remove the patch, follow instructions on the last 2 pages of Patient  Logbook. Stick patch monitor onto the last page of Patient Logbook.  Place Patient Logbook in the blue and white box. Use locking tab on box and tape box closed  securely. The blue and white box has prepaid postage on it. Please place it in the mailbox as  soon as possible. Your physician should have your test results approximately 7 days after the  monitor has been mailed back to [facility].  Call [facility] Customer Care at [phone] if you have questions regarding  your ZIO XT patch monitor. Call  them immediately if you see an orange light blinking on your  monitor.  If your monitor falls off in less than 4 days, contact our Monitor department at [phone].  If your monitor becomes loose or falls off after 4 days call [employer] at [phone] for  suggestions on securing your monitor   Follow-Up: At [facility], you and your health needs are our priority.  As part of our continuing mission to provide you with exceptional heart care, we have created designated Provider Care  Teams.  These Care Teams include your primary Cardiologist (physician) and Advanced Practice Providers (APPs -  Physician Assistants and Nurse Practitioners) who all work together to provide you with the care you need, when you need it.  You will need a follow up appointment in 6 months  Providers on your designated Care Team:   Nicolasa Ducking, NP Eula Listen, PA-C Cadence Fransico Michael, [place]  COVID-19 Vaccine Information can be found at: [email or web address] For questions related to vaccine distribution or appointments, please email [email or web address] or call [phone].

## 2023-03-27 DIAGNOSIS — I480 Paroxysmal atrial fibrillation: Secondary | ICD-10-CM

## 2023-03-27 DIAGNOSIS — R001 Bradycardia, unspecified: Secondary | ICD-10-CM

## 2023-03-27 DIAGNOSIS — R42 Dizziness and giddiness: Secondary | ICD-10-CM | POA: Diagnosis not present

## 2023-04-03 ENCOUNTER — Ambulatory Visit (INDEPENDENT_AMBULATORY_CARE_PROVIDER_SITE_OTHER): Payer: Medicare Other | Admitting: Family Medicine

## 2023-04-03 ENCOUNTER — Encounter: Payer: Self-pay | Admitting: Family Medicine

## 2023-04-03 VITALS — BP 118/58 | HR 60 | Temp 98.1°F | Ht 67.0 in | Wt 162.0 lb

## 2023-04-03 DIAGNOSIS — R413 Other amnesia: Secondary | ICD-10-CM

## 2023-04-03 DIAGNOSIS — Z23 Encounter for immunization: Secondary | ICD-10-CM | POA: Diagnosis not present

## 2023-04-03 NOTE — Progress Notes (Unsigned)
5mm pustule on the R posterior lower leg, no spreading erythema.  Unclear if from a insect bite.  D/w pt about observation.  Single lesion.  He is going to update me as needed.  Has Zio patch now. Awaiting report from that.    Memory follow up.  30/30 MMSE today.  He noted some changes, ie if he gets distracted in the midst of one task/series it is harder than previous to come back and pick up where he left off.  D/w pt about organization, making lists.  No red flag events.  He is not getting lost.  He can occasionally misplace an item around the house but then he finds it.  Meds, vitals, and allergies reviewed.   ROS: Per HPI unless specifically indicated in ROS section   GEN: nad, alert and oriented HEENT: ncat NECK: supple w/o LA CV: rrr.  PULM: ctab, no inc wob ABD: soft, +bs EXT: no edema SKIN: no acute rash  MMSE 30/30.    30 minutes were devoted to patient care in this encounter (this includes time spent reviewing the patient's file/history, interviewing and examining the patient, counseling/reviewing plan with patient).

## 2023-04-03 NOTE — Patient Instructions (Addendum)
Update me as needed.   If your notice more memory change, then let me know.  I would try using lists and and try to complete one task before starting another.  Take care.  Glad to see you.  Recheck at a visit in November.  A1c at the visit.  You don't need to fast.

## 2023-04-05 ENCOUNTER — Other Ambulatory Visit: Payer: Self-pay | Admitting: Family Medicine

## 2023-04-05 NOTE — Assessment & Plan Note (Signed)
MMSE 30/30.   I do not suspect an ominous process.  Okay for outpatient follow-up.  Discussed that he could have mild memory changes at this point but given his situation I think observation makes sense.  He agrees. If more memory changes noted by patient, then he can let me know.  I would try using lists and and try to complete one task before starting another.  Discussed. Recheck at a visit in November.

## 2023-04-07 ENCOUNTER — Other Ambulatory Visit: Payer: Self-pay | Admitting: Nurse Practitioner

## 2023-04-12 ENCOUNTER — Encounter: Payer: Self-pay | Admitting: Family Medicine

## 2023-04-12 DIAGNOSIS — H16141 Punctate keratitis, right eye: Secondary | ICD-10-CM | POA: Diagnosis not present

## 2023-04-12 DIAGNOSIS — Z0100 Encounter for examination of eyes and vision without abnormal findings: Secondary | ICD-10-CM | POA: Diagnosis not present

## 2023-04-12 DIAGNOSIS — E119 Type 2 diabetes mellitus without complications: Secondary | ICD-10-CM | POA: Diagnosis not present

## 2023-04-12 DIAGNOSIS — H16142 Punctate keratitis, left eye: Secondary | ICD-10-CM | POA: Diagnosis not present

## 2023-04-12 DIAGNOSIS — H401130 Primary open-angle glaucoma, bilateral, stage unspecified: Secondary | ICD-10-CM | POA: Diagnosis not present

## 2023-04-12 LAB — HM DIABETES EYE EXAM

## 2023-04-14 DIAGNOSIS — H26492 Other secondary cataract, left eye: Secondary | ICD-10-CM | POA: Diagnosis not present

## 2023-04-15 DIAGNOSIS — I480 Paroxysmal atrial fibrillation: Secondary | ICD-10-CM | POA: Diagnosis not present

## 2023-04-15 DIAGNOSIS — R001 Bradycardia, unspecified: Secondary | ICD-10-CM | POA: Diagnosis not present

## 2023-04-17 ENCOUNTER — Other Ambulatory Visit: Payer: Self-pay | Admitting: Internal Medicine

## 2023-04-17 ENCOUNTER — Telehealth: Payer: Self-pay | Admitting: Cardiovascular Disease

## 2023-04-17 NOTE — Telephone Encounter (Signed)
Zio results given to Dr. Mariah Milling for review.

## 2023-04-17 NOTE — Telephone Encounter (Signed)
Calling to report abnormal Zio monitor results.   ** While transferring call to Triage they hung up**

## 2023-04-17 NOTE — Telephone Encounter (Signed)
See previous phone encounter.

## 2023-04-17 NOTE — Telephone Encounter (Signed)
Received ZIO monitor report via fax. Delivered to Dr. Windell Hummingbird nurse, Tillman Sers, RN.

## 2023-04-18 ENCOUNTER — Telehealth: Payer: Self-pay | Admitting: Cardiovascular Disease

## 2023-04-18 NOTE — Telephone Encounter (Signed)
[name] [name] from Zio called to give abnormal. She states slow AFIB HR 39 lasting one minute on Aug 31 at 11:45 pm. Will forward to provider. This is a [address] pt and will forward to Dr. Clifton James and [address] triage.

## 2023-04-18 NOTE — Telephone Encounter (Signed)
Zio by [employer] is calling to give abnormal results.  

## 2023-04-20 ENCOUNTER — Other Ambulatory Visit: Payer: Self-pay | Admitting: Cardiovascular Disease

## 2023-04-21 NOTE — Telephone Encounter (Signed)
Called patient and notified him of the following from Dr. Mariah Milling.  Event monitor  Showing 36% paroxysmal A-fib, long runs noted  Previously noted pauses, again noted this event monitor, typically overnight up to 3.9 seconds likely sleeping,  On Tikosyn  No patient triggered events recorded  Will CC Dr. Ladona Ridgel   Patient verbalizes understanding.

## 2023-05-03 DIAGNOSIS — Z0100 Encounter for examination of eyes and vision without abnormal findings: Secondary | ICD-10-CM | POA: Diagnosis not present

## 2023-05-05 DIAGNOSIS — R351 Nocturia: Secondary | ICD-10-CM | POA: Diagnosis not present

## 2023-05-05 DIAGNOSIS — R109 Unspecified abdominal pain: Secondary | ICD-10-CM | POA: Diagnosis not present

## 2023-05-10 ENCOUNTER — Encounter: Payer: Self-pay | Admitting: Cardiovascular Disease

## 2023-05-10 ENCOUNTER — Ambulatory Visit: Payer: Medicare Other | Attending: Cardiovascular Disease | Admitting: Cardiovascular Disease

## 2023-05-10 DIAGNOSIS — I209 Angina pectoris, unspecified: Secondary | ICD-10-CM | POA: Diagnosis not present

## 2023-05-10 DIAGNOSIS — I4819 Other persistent atrial fibrillation: Secondary | ICD-10-CM

## 2023-05-10 DIAGNOSIS — I25709 Atherosclerosis of coronary artery bypass graft(s), unspecified, with unspecified angina pectoris: Secondary | ICD-10-CM

## 2023-05-10 DIAGNOSIS — Z951 Presence of aortocoronary bypass graft: Secondary | ICD-10-CM

## 2023-05-10 DIAGNOSIS — R0683 Snoring: Secondary | ICD-10-CM

## 2023-05-10 DIAGNOSIS — I480 Paroxysmal atrial fibrillation: Secondary | ICD-10-CM

## 2023-05-10 DIAGNOSIS — G4733 Obstructive sleep apnea (adult) (pediatric): Secondary | ICD-10-CM | POA: Diagnosis not present

## 2023-05-10 DIAGNOSIS — E119 Type 2 diabetes mellitus without complications: Secondary | ICD-10-CM

## 2023-05-10 DIAGNOSIS — D6859 Other primary thrombophilia: Secondary | ICD-10-CM

## 2023-05-10 DIAGNOSIS — I10 Essential (primary) hypertension: Secondary | ICD-10-CM | POA: Diagnosis not present

## 2023-05-10 NOTE — Progress Notes (Signed)
Cardiology Office Note    Date:  05/15/2023   ID:  Sye, Schroepfer [birth year], MRN [id number]  PCP:  Joaquim Nam, MD  Cardiologist:  Nicki Guadalajara, MD (sleep); Dr. Mariah Milling  New sleep consultation referred through the courtesy of Dr. Crawford Givens for evaluation of potential sleep apnea.   History of Present Illness:  [name] [name] is a 82 y.o. male who is followed by Dr. Crawford Givens for primary care.  He has a history of hypertension, diabetes mellitus, CAD, status post CABG revascularization, PAF, and at times have had some issues with memory.  He underwent CABG revascularization surgery x 6 in 2015 by Dr. Tyrone Sage.  An echo Doppler study in July 2018 showed EF 55% with mild dilation of his aortic root and evidence for small PFO.  He has a history of PAF and had undergone cardioversion in January 2019 with recurrence in February 2019 at which time he was placed on dofetilide with restoration of sinus rhythm.  His last catheterization was on May 16, 2022 by Dr. Cloudcroft Sink which showed severe underlying three-vessel CAD with patent grafts including a LIMA to LAD, SVG to OM 2 and OM 3, and SVG to RCA .  The SVG to the second diagonal had severe anastomosis stenosis which was successfully stented with an Onyx frontier 2.25 x 15 mm stent.  Most recently, he is on a regimen of amlodipine 10 mg, isosorbide 30 mg, losartan 100 mg, and spironolactone 25 mg daily for blood pressure and his CAD.  He continues to be on Eliquis for anticoagulation.  He is on dofetilide 500 mg twice a day and is maintaining sinus rhythm.  He has GERD on pantoprazole.  He is diabetic on metformin 850 mg twice a day.  He recently wore a 14-day event monitor which showed 36% of time he was in atrial fibrillation with long runs noted.  He also was noted to have pauses while sleeping up to 3.9 seconds.  It was recommended that he follow-up with Dr. Ladona Ridgel.  He had recently seen Dr. Crawford Givens and with his history of  snoring, recurrent AF and nocturnal pauses he is referred for evaluation of possible sleep apnea.  Presently, [name] [name] typically goes to bed around 10 PM and often wakes up between 2 and 4 AM sleeping for 4 to 6 hours at that time.  He may often take a nap after lunch for several hours.  He admits to snoring.  He is unaware of restless legs.  He denies any bruxism.  He denies any hypnopompic or hypnagogic neck hallucinations or cataplectic events.  He presents for initial sleep consultation.  Past Medical History:  Diagnosis Date   Anxiety    Back pain    Coronary artery disease 08/21/2013   a. 08/2013 s/p CABG x 6; b. 05/2022 PCI: LAD 126m, 60d, D2 50, LCX 80ost, 50m, 50m/d, OM1 100, RCA 100p, VG->OM2->OM3 nl, VG->D2 30ost, 99d @ Diag insertion (2.25x15 Onyx Frontier DES), VG->RCA nl, LIMA->LAD nl. EF 55-65%.   Diabetes mellitus    Diabetic neuropathy (HCC)    Diastolic dysfunction    a. 02/2017 Echo: EF 55-60%, no rwma, Gr1 DD, mildly dil Ao Root/RV. + PFO.   Diverticulosis    Diverticulosis    Embolic stroke (HCC)    a. 04/2017 MRI in setting of freq falls: small subacute infarction of bilat centrum semi-ovale consistent w/ embolic event-->Eliquis.   Erectile dysfunction    Family  history of anesthesia complication    ' they cant wake my brother very easy"   Fatty liver    Flank pain    GERD (gastroesophageal reflux disease)    History of bronchitis    History of chicken pox    HSV infection    ocular symptoms and oral lesions   Hypercholesterolemia    Hypertension    Kidney stones    Osteoarthritis    PAF (paroxysmal atrial fibrillation) (HCC)    a. Had post-op AF 08/2013; b. 09/2015 Holter: PAC's no AF; c. 05/2017 Noted to be in AFib-->Eliquis (CHA2DS2VASc = 7); d. 08/2017 s/p DCCV-->ERAF-->Tikosyn added; e. 08/2020 Zio: 4% Afib burden.   PFO (patent foramen ovale)    a. 02/2017 Echo: + PFO.   Sinus node dysfunction (HCC)    a. 08/2020 Zio: Predominantly sinus bradycardia at 53  (35-211).  20 SVT runs.  1 run of nonsustained VT x4 beats.  4% A-fib burden.  16 pauses lasting up to 3.6 seconds.  Intermittent junctional rhythm.  Patient declined pacemaker.    Past Surgical History:  Procedure Laterality Date   CARDIAC CATHETERIZATION  08/21/2013   DR Clifton James   CARDIOVERSION N/A 08/24/2017   Procedure: CARDIOVERSION;  Surgeon: Antonieta Iba, MD;  Location: [facility] ORS;  Service: Cardiovascular;  Laterality: N/A;   CHOLECYSTECTOMY  1983   COLONOSCOPY WITH PROPOFOL N/A 01/04/2023   Procedure: COLONOSCOPY WITH PROPOFOL;  Surgeon: Wyline Mood, MD;  Location: [facility] ENDOSCOPY;  Service: Gastroenterology;  Laterality: N/A;   CORONARY ARTERY BYPASS GRAFT N/A 08/23/2013   Procedure: CORONARY ARTERY BYPASS GRAFTING (CABG);  Surgeon: Delight Ovens, MD;  Location: [facility] OR;  Service: Open Heart Surgery;  Laterality: N/A;  CABG times six utilizing the left internal mammary artery and the right greater saphenous vein harvested endoscopically   CORONARY STENT INTERVENTION N/A 05/16/2022   Procedure: CORONARY STENT INTERVENTION;  Surgeon: Iran Ouch, MD;  Location: [facility] INVASIVE CV LAB;  Service: Cardiovascular;  Laterality: N/A;   ENDOVEIN HARVEST OF GREATER SAPHENOUS VEIN Right 08/23/2013   Procedure: ENDOVEIN HARVEST OF GREATER SAPHENOUS VEIN;  Surgeon: Delight Ovens, MD;  Location: [facility] OR;  Service: Open Heart Surgery;  Laterality: Right;   ESOPHAGOGASTRODUODENOSCOPY (EGD) WITH PROPOFOL N/A 01/04/2023   Procedure: ESOPHAGOGASTRODUODENOSCOPY (EGD) WITH PROPOFOL;  Surgeon: Wyline Mood, MD;  Location: [facility] ENDOSCOPY;  Service: Gastroenterology;  Laterality: N/A;   HERNIA REPAIR     X3   INTRAOPERATIVE TRANSESOPHAGEAL ECHOCARDIOGRAM N/A 08/23/2013   Procedure: INTRAOPERATIVE TRANSESOPHAGEAL ECHOCARDIOGRAM;  Surgeon: Delight Ovens, MD;  Location: [facility] OR;  Service: Open Heart Surgery;  Laterality: N/A;   LEFT HEART CATH AND CORS/GRAFTS ANGIOGRAPHY N/A 05/16/2022   Procedure: LEFT  HEART CATH AND CORS/GRAFTS ANGIOGRAPHY;  Surgeon: Iran Ouch, MD;  Location: [facility] INVASIVE CV LAB;  Service: Cardiovascular;  Laterality: N/A;   LEFT HEART CATHETERIZATION WITH CORONARY ANGIOGRAM N/A 08/21/2013   Procedure: LEFT HEART CATHETERIZATION WITH CORONARY ANGIOGRAM;  Surgeon: Kathleene Hazel, MD;  Location: [facility] CATH LAB;  Service: Cardiovascular;  Laterality: N/A;    Current Medications: Outpatient Medications Prior to Visit  Medication Sig Dispense Refill   acyclovir (ZOVIRAX) 800 MG tablet TAKE 1 TABLET TWICE A DAY 180 tablet 3   amLODipine (NORVASC) 10 MG tablet TAKE 1 TABLET DAILY 90 tablet 2   apixaban (ELIQUIS) 5 MG TABS tablet Take 1 tablet (5 mg total) by mouth 2 (two) times daily. 180 tablet 3   ciclopirox (PENLAC) 8 % solution Apply topically.  COD LIVER OIL PO Take 1 capsule by mouth 2 (two) times daily. 10 mcg     dofetilide (TIKOSYN) 500 MCG capsule TAKE 1 CAPSULE BY MOUTH 2 TIMES A DAY 180 capsule 0   ezetimibe (ZETIA) 10 MG tablet TAKE 1 TABLET DAILY 90 tablet 0   gabapentin (NEURONTIN) 100 MG capsule Take 1-2 capsules (100-200 mg total) by mouth 3 (three) times daily. 100 capsule 2   isosorbide mononitrate (IMDUR) 30 MG 24 hr tablet Take 30 mg by mouth daily.     loratadine (CLARITIN) 10 MG tablet Take 1 tablet (10 mg total) by mouth daily.     losartan (COZAAR) 100 MG tablet TAKE 1 TABLET DAILY (MUST KEEP UPCOMING APPOINTMENT IN JULY 2024 WITH DR Ladona Ridgel BEFORE ANYMORE REFILLS, FINAL ATTEMPT) 90 tablet 3   metFORMIN (GLUCOPHAGE) 850 MG tablet TAKE 1 TABLET TWICE A DAY WITH MEALS 180 tablet 3   nitroGLYCERIN (NITROSTAT) 0.4 MG SL tablet Place 1 tablet (0.4 mg total) under the tongue every 5 (five) minutes as needed for chest pain (max 3 doses). 25 tablet 6   nystatin (MYCOSTATIN/NYSTOP) powder Apply 1 Application topically 2 (two) times daily. Use until resolved and then 2 extra days. 60 g 1   Omega-3 Fatty Acids (FISH OIL) 1000 MG CAPS Take 1 capsule by  mouth 2 (two) times daily.     pantoprazole (PROTONIX) 40 MG tablet Take 1 tablet (40 mg total) by mouth daily. 90 tablet 3   tamsulosin (FLOMAX) 0.4 MG CAPS capsule Take 0.4 mg by mouth daily.     timolol (BETIMOL) 0.5 % ophthalmic solution Place 1 drop into both eyes 2 (two) times daily.     spironolactone (ALDACTONE) 25 MG tablet Take 1 tablet (25 mg total) by mouth daily. 90 tablet 3   No facility-administered medications prior to visit.     Allergies:   Fenofibrate, Fire ant, and Statins   Social History   Socioeconomic History   Marital status: Married    Spouse name: Not on file   Number of children: 2   Years of education: HS   Highest education level: Not on file  Occupational History   Occupation: Retired  Tobacco Use   Smoking status: Never   Smokeless tobacco: Never  Vaping Use   Vaping status: Never Used  Substance and Sexual Activity   Alcohol use: No    Alcohol/week: 0.0 standard drinks of alcohol   Drug use: No   Sexual activity: Not on file  Other Topics Concern   Not on file  Social History Narrative   Retired, remarried 1988   From [place], [place]   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   Right-handed.   2 cups caffeine daily   Social Determinants of Health   Financial Resource Strain: Low Risk  (02/07/2023)   Overall Financial Resource Strain (CARDIA)    Difficulty of Paying Living Expenses: Not hard at all  Food Insecurity: No Food Insecurity (02/07/2023)   Hunger Vital Sign    Worried About Running Out of Food in the Last Year: Never true    Ran Out of Food in the Last Year: Never true  Transportation Needs: No Transportation Needs (02/07/2023)   PRAPARE - Administrator, Civil Service (Medical): No    Lack of Transportation (Non-Medical): No  Physical Activity: Sufficiently Active (02/07/2023)   Exercise Vital Sign    Days of Exercise per Week: 5 days  Minutes of Exercise per  Session: 60 min  Stress: No Stress Concern Present (02/07/2023)   [employer] of Occupational Health - Occupational Stress Questionnaire    Feeling of Stress : Not at all  Social Connections: Socially Integrated (02/07/2023)   Social Connection and Isolation Panel [NHANES]    Frequency of Communication with Friends and Family: More than three times a week    Frequency of Social Gatherings with Friends and Family: Three times a week    Attends Religious Services: More than 4 times per year    Active Member of Clubs or Organizations: Yes    Attends Engineer, structural: More than 4 times per year    Marital Status: Married    Socially he was born in [place].  He is in his second marriage of 36 years.  He and his wife have 6 children combined from previous marriage he retired at age 47.  He now works in farming.  Family History:  The patient's family history includes CAD in his brother; Colon cancer in his maternal grandmother; Diabetes in his mother; Heart disease in his father; Parkinsonism in his maternal grandfather; Prostate cancer in his maternal uncle; Stroke in his mother and paternal grandfather.   ROS General: Negative; No fevers, chills, or night sweats;  HEENT: Negative; No changes in vision or hearing, sinus congestion, difficulty swallowing Pulmonary: Negative; No cough, wheezing, shortness of breath, hemoptysis Cardiovascular: CAD, status post CABG surgery, status post stenting to SVG to second diagonal, history of atrial fibrillation with recurrence GI: Negative; No nausea, vomiting, diarrhea, or abdominal pain GU: Negative; No dysuria, hematuria, or difficulty voiding Musculoskeletal: Negative; no myalgias, joint pain, or weakness Hematologic/Oncology: Negative; no easy bruising, bleeding Endocrine: Negative; no heat/cold intolerance; no diabetes Neuro: Negative; no changes in balance, headaches Skin: Negative; No rashes or skin  lesions Psychiatric: Negative; No behavioral problems, depression Sleep: Negative; No snoring, daytime sleepiness, hypersomnolence, bruxism, restless legs, hypnogognic hallucinations, no cataplexy Other comprehensive 14 point system review is negative.   PHYSICAL EXAM:   VS:  BP 114/74   Pulse (!) 59   Ht 5\' 7"  (1.702 m)   Wt 159 lb (72.1 kg)   SpO2 95%   BMI 24.90 kg/m     Repeat blood pressure by me was 126/76  Wt Readings from Last 3 Encounters:  05/10/23 159 lb (72.1 kg)  04/03/23 162 lb (73.5 kg)  03/24/23 161 lb (73 kg)    General: Alert, oriented, no distress.  Skin: normal turgor, no rashes, warm and dry HEENT: Normocephalic, atraumatic. Pupils equal round and reactive to light; sclera anicteric; extraocular muscles intact;  Nose without nasal septal hypertrophy Mouth/Parynx benign; Mallinpatti scale 3 Neck: No JVD, no carotid bruits; normal carotid upstroke Lungs: clear to ausculatation and percussion; no wheezing or rales Chest wall: without tenderness to palpitation Heart: PMI not displaced, irregularly irregular consistent with atrial fibrillation, rate in the upper 50s, s1 s2 normal, 1/6 systolic murmur, no diastolic murmur, no rubs, gallops, thrills, or heaves Abdomen: soft, nontender; no hepatosplenomehaly, BS+; abdominal aorta nontender and not dilated by palpation. Back: no CVA tenderness Pulses 2+ Musculoskeletal: full range of motion, normal strength, no joint deformities Extremities: no clubbing cyanosis or edema, Homan's sign negative  Neurologic: grossly nonfocal; Cranial nerves grossly wnl Psychologic: Normal mood and affect   Studies/Labs Reviewed:   EKG Interpretation Date/Time:  Wednesday May 10 2023 10:38:22 EDT Ventricular Rate:  59 PR Interval:    QRS Duration:  82  QT Interval:  456 QTC Calculation: 451 R Axis:   -35  Text Interpretation: Atrial fibrillation with slow ventricular response Left axis deviation Inferior infarct (cited  on or before 24-Mar-2023) When compared with ECG of 24-Mar-2023 09:03, Atrial fibrillation has replaced Sinus rhythm Confirmed by Nicki Guadalajara ([id number]) on 05/10/2023 11:24:38 AM    Recent Labs:    Latest Ref Rng & Units 12/23/2022    6:42 AM 09/24/2022    9:17 AM 09/22/2022   11:05 AM  BMP  Glucose 70 - 99 mg/dL 829  562  130   BUN 8 - 23 mg/dL 17  21  18    Creatinine 0.61 - 1.24 mg/dL 8.65  7.84  6.96   Sodium 135 - 145 mmol/L 135  136  136   Potassium 3.5 - 5.1 mmol/L 3.7  4.3  4.5   Chloride 98 - 111 mmol/L 108  103  102   CO2 22 - 32 mmol/L 20  25  25    Calcium 8.9 - 10.3 mg/dL 8.9  9.7  29.5         Latest Ref Rng & Units 12/23/2022    6:42 AM 09/24/2022    9:17 AM 09/22/2022   11:05 AM  Hepatic Function  Total Protein 6.5 - 8.1 g/dL 6.6  7.2  7.3   Albumin 3.5 - 5.0 g/dL 3.9  4.4  4.7   AST 15 - 41 U/L 28  22  18    ALT 0 - 44 U/L 25  15  13    Alk Phosphatase 38 - 126 U/L 63  77  71   Total Bilirubin 0.3 - 1.2 mg/dL 1.9  1.3  1.2        Latest Ref Rng & Units 12/23/2022    6:42 AM 09/25/2022    4:19 AM 09/24/2022    9:17 AM  CBC  WBC 4.0 - 10.5 K/uL 5.3  6.4  6.5   Hemoglobin 13.0 - 17.0 g/dL 28.4  13.2  44.0   Hematocrit 39.0 - 52.0 % 43.9  39.2  44.9   Platelets 150 - 400 K/uL 149  165  183    Lab Results  Component Value Date   MCV 93.2 12/23/2022   MCV 92.9 09/25/2022   MCV 95.1 09/24/2022   Lab Results  Component Value Date   TSH 2.566 09/24/2022   Lab Results  Component Value Date   HGBA1C 7.9 (H) 02/10/2023     BNP No results found for: "BNP"  ProBNP No results found for: "PROBNP"   Lipid Panel     Component Value Date/Time   CHOL 147 02/10/2023 0815   TRIG (H) 02/10/2023 0815    531.0 Triglyceride is over 400; calculations on Lipids are invalid.   HDL 26.80 (L) 02/10/2023 0815   CHOLHDL 5 02/10/2023 0815   VLDL 44.0 (H) 12/16/2021 1255   LDLCALC 62 06/07/2017 0738   LDLDIRECT 67.0 02/10/2023 0815     RADIOLOGY: LONG TERM MONITOR  (3-14 DAYS)  Result Date: 04/21/2023 Event monitor Patch Wear Time:  13 days and 23 hours (2024-08-19T19:00:58-0400 to 2024-09-02T19:00:54-0400) Normal sinus rhythm Patient had a min HR of 32 bpm, max HR of 135 bpm, and avg HR of 61 bpm. Atrial Fibrillation occurred (36% burden), ranging from 32-135 bpm (avg of 68 bpm), the longest lasting 2 days 19 hours with an avg rate of 64 bpm. 22 Pauses occurred, the longest lasting 3.9 secs (15 bpm).  Pauses noted overnight 1 to 3 AM Isolated  SVEs were occasional (1.3%, 15634), SVE Couplets were rare (<1.0%, 2244), and SVE Triplets were rare (<1.0%, 496). Isolated VEs were rare (<1.0%, 522), VE Couplets were rare (<1.0%, 9), and VE Triplets were rare (<1.0%, 3). Ventricular Bigeminy was present. No patient triggered events recorded Signed, Dossie Arbour, MD, Ph.D [facility]     Additional studies/ records that were reviewed today include:   I reviewed the patient's previous records including prior CABG revascularization, PCI to SVG to OM, records of Dr. Mariah Milling and Dr. Para March.    ASSESSMENT:    1. Evaluate for OSA (obstructive sleep apnea)   2. Primary hypertension   3. Persistent atrial fibrillation (HCC)   4. Coronary artery disease involving coronary bypass graft of native heart with angina pectoris (HCC)   5. Hx of CABG   6. Snoring   7. Hypercoagulable state (HCC)   8. Type 2 diabetes mellitus without complication, without long-term current use of insulin [facility])     PLAN:  [name] [name] is an 82 year old gentleman who has known CAD and had undergone initial CABG revascularization surgery by Dr. Tyrone Sage in 2015.  He has a history of PAF and had undergone initial cardioversion in January 2019 with subsequent recurrence leading to Tikosyn administration.  Most recently, he has been monitored to be in and out of atrial fibrillation and a recent 14-day monitor has shown 36% A-fib burden and the patient has been noted to have some pauses at nighttime  while sleeping.  He has long term history of snoring.  He is retired.  He often goes to bed at 10 PM but may wake up between 2 and 4 AM for good and then often times may sleep 2 to 3 hours after lunch and a prolonged nap.  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  He admits to nocturia approximately 2 times per night.  His sleep is nonrestorative.  I have concern that [name] [name] may very well have obstructive sleep apnea which may be contributing to his recurrent atrial fibrillation.  I had a long discussion with him today in the office and reviewed optimal sleep duration at 7 and 9 hours.  I discussed normal sleep architecture and potential disruption to this if sleep apnea is present.  In addition I discussed potential adverse cardiovascular consequences of untreated sleep apnea such as its effect on hypertension control, development of nocturnal arrhythmias, increased recurrent episodes of atrial fibrillation, as well as's its negative effects on insulin resistance, increased inflammatory markers, as well as nocturnal GERD.  Particularly with his underlying CAD, I discussed potential nocturnal hypoxemia contributing to nocturnal ischemia and increased risk for potential nocturnal mediated ACS.  In addition I discussed the pathophysiology associated with increased nocturia if untreated sleep apnea is present.  I am scheduled him to undergo a in lab split-night sleep study for further evaluation.  If he meets criteria early on, CPAP titration will be initiated.  His blood pressure today is stable on his multidrug regimen consisting of amlodipine 10 mg, isosorbide 30 mg, losartan 100 mg, and spironolactone 25 mg.  He continues to be on dofetilide with QTc interval is normal at 451 ms; however, ECG today demonstrates that he is still in atrial fibrillation.  He is not having any anginal symptomatology or significant dyspnea on his current  medical regimen.  I will see him in follow-up in February/March following his sleep evaluation and  probable initiation of CPAP therapy. I will need to see him within 90 days of initiating treatment.  I answered all his questions and he was very appreciative of the time spent today.   Medication Adjustments/Labs and Tests Ordered: Current medicines are reviewed at length with the patient today.  Concerns regarding medicines are outlined above.  Medication changes, Labs and Tests ordered today are listed in the Patient Instructions below. Patient Instructions  Medication Instructions:  No medication changes *If you need a refill on your cardiac medications before your next appointment, please call your pharmacy*   Lab Work: none If you have labs (blood work) drawn today and your tests are completely normal, you will receive your results only by: MyChart Message (if you have MyChart) OR A paper copy in the mail If you have any lab test that is abnormal or we need to change your treatment, we will call you to review the results.   Testing/Procedures: Your physician has recommended that you have a sleep study. This test records several body functions during sleep, including: brain activity, eye movement, oxygen and carbon dioxide blood levels, heart rate and rhythm, breathing rate and rhythm, the flow of air through your mouth and nose, snoring, body muscle movements, and chest and belly movement.    Follow-Up: At [facility], you and your health needs are our priority.  As part of our continuing mission to provide you with exceptional heart care, we have created designated Provider Care Teams.  These Care Teams include your primary Cardiologist (physician) and Advanced Practice Providers (APPs -  Physician Assistants and Nurse Practitioners) who all work together to provide you with the care you need, when you need it.  We recommend signing up for the patient portal called "MyChart".   Sign up information is provided on this After Visit Summary.  MyChart is used to connect with patients for Virtual Visits (Telemedicine).  Patients are able to view lab/test results, encounter notes, upcoming appointments, etc.  Non-urgent messages can be sent to your provider as well.   To learn more about what you can do with MyChart, go to [email or web address].    Your next appointment:   6 month(s)  Provider:   Dr. Nicki Guadalajara    Signed, Nicki Guadalajara, MD , [facility], ABSM Diplomate, [place] Board of Sleep Medicine  05/15/2023 1:47 PM    [facility] [address], [place], [place]  [address] Phone: [phone]

## 2023-05-10 NOTE — Patient Instructions (Signed)
Medication Instructions:  No medication changes *If you need a refill on your cardiac medications before your next appointment, please call your pharmacy*   Lab Work: none If you have labs (blood work) drawn today and your tests are completely normal, you will receive your results only by: MyChart Message (if you have MyChart) OR A paper copy in the mail If you have any lab test that is abnormal or we need to change your treatment, we will call you to review the results.   Testing/Procedures: Your physician has recommended that you have a sleep study. This test records several body functions during sleep, including: brain activity, eye movement, oxygen and carbon dioxide blood levels, heart rate and rhythm, breathing rate and rhythm, the flow of air through your mouth and nose, snoring, body muscle movements, and chest and belly movement.    Follow-Up: At [facility], you and your health needs are our priority.  As part of our continuing mission to provide you with exceptional heart care, we have created designated Provider Care Teams.  These Care Teams include your primary Cardiologist (physician) and Advanced Practice Providers (APPs -  Physician Assistants and Nurse Practitioners) who all work together to provide you with the care you need, when you need it.  We recommend signing up for the patient portal called "MyChart".  Sign up information is provided on this After Visit Summary.  MyChart is used to connect with patients for Virtual Visits (Telemedicine).  Patients are able to view lab/test results, encounter notes, upcoming appointments, etc.  Non-urgent messages can be sent to your provider as well.   To learn more about what you can do with MyChart, go to [email or web address].    Your next appointment:   6 month(s)  Provider:   Dr. Nicki Guadalajara

## 2023-05-15 ENCOUNTER — Encounter: Payer: Self-pay | Admitting: Cardiovascular Disease

## 2023-05-17 ENCOUNTER — Telehealth: Payer: Self-pay

## 2023-05-17 NOTE — Telephone Encounter (Signed)
**Note De-Identified [name] [name] Obfuscation** Per the [facility] Provider Portal-No PA required for CPT Code: 47829 (Split Night Sleep Study).

## 2023-05-23 DIAGNOSIS — H524 Presbyopia: Secondary | ICD-10-CM | POA: Diagnosis not present

## 2023-05-25 ENCOUNTER — Other Ambulatory Visit: Payer: Self-pay | Admitting: Cardiovascular Disease

## 2023-06-05 ENCOUNTER — Ambulatory Visit (HOSPITAL_BASED_OUTPATIENT_CLINIC_OR_DEPARTMENT_OTHER): Payer: Medicare Other | Attending: Cardiovascular Disease | Admitting: Cardiovascular Disease

## 2023-06-05 VITALS — Ht 66.0 in | Wt 162.0 lb

## 2023-06-05 DIAGNOSIS — G4733 Obstructive sleep apnea (adult) (pediatric): Secondary | ICD-10-CM | POA: Insufficient documentation

## 2023-06-05 DIAGNOSIS — I4819 Other persistent atrial fibrillation: Secondary | ICD-10-CM | POA: Diagnosis not present

## 2023-06-08 ENCOUNTER — Other Ambulatory Visit: Payer: Self-pay | Admitting: Family Medicine

## 2023-06-14 ENCOUNTER — Encounter: Payer: Self-pay | Admitting: Internal Medicine

## 2023-06-14 ENCOUNTER — Ambulatory Visit: Payer: Medicare Other | Attending: Internal Medicine | Admitting: Internal Medicine

## 2023-06-14 VITALS — BP 110/60 | HR 64 | Ht 67.0 in | Wt 159.0 lb

## 2023-06-14 DIAGNOSIS — Z01812 Encounter for preprocedural laboratory examination: Secondary | ICD-10-CM | POA: Diagnosis not present

## 2023-06-14 DIAGNOSIS — I4819 Other persistent atrial fibrillation: Secondary | ICD-10-CM | POA: Diagnosis not present

## 2023-06-14 NOTE — Patient Instructions (Addendum)
Medication Instructions:  Your physician recommends that you continue on your current medications as directed. Please refer to the Current Medication list given to you today.  *If you need a refill on your cardiac medications before your next appointment, please call your pharmacy*  Lab Work: CBC and Bmet today!  If you have labs (blood work) drawn today and your tests are completely normal, you will receive your results only by: MyChart Message (if you have MyChart) OR A paper copy in the mail If you have any lab test that is abnormal or we need to change your treatment, we will call you to review the results.  Testing/Procedures: Pacemaker insertion Jul 03, 2023  Follow-Up: At [facility], you and your health needs are our priority.  As part of our continuing mission to provide you with exceptional heart care, we have created designated Provider Care Teams.  These Care Teams include your primary Cardiologist (physician) and Advanced Practice Providers (APPs -  Physician Assistants and Nurse Practitioners) who all work together to provide you with the care you need, when you need it.  Your next appointment:   To be scheduled   The format for your next appointment:   In Person  Provider:   Lewayne Bunting, MD{or one of the following Advanced Practice Providers on your designated Care Team:   Francis Dowse, [place] Casimiro Needle "Mardelle Matte" Roots, [place] Earnest Rosier, NP    Important Information About Sugar

## 2023-06-14 NOTE — Progress Notes (Signed)
HPI [name] returns today for followup. He is a pleasant 82 yo man with a h/o PAF who has been controlled with dofetilide. He also has HTN. 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 He has occasional break through palpitations. He gets lightheaded at times but denies syncope or near syncope. He has worn a cardiac monitor which demonstrates sinus bradycardia during the daytime in the 30's and daytime pauses of 3.5 seconds. He has NSVT. No sustained atrial fib though some brief NSSVT.  When I saw him last, I offered him a PPM due to 3.5 second pauses and he declined. He has continued to have syncope and his feels more and more palpitations. He is willing to have a PPM now. Allergies  Allergen Reactions   Fenofibrate Other (See Comments)    Muscle weakness/pain   Fire Ant     Allergic reaction   Statins Other (See Comments)    MYALGIA, muscle pain     Current Outpatient Medications  Medication Sig Dispense Refill   acyclovir (ZOVIRAX) 800 MG tablet TAKE 1 TABLET TWICE A DAY 180 tablet 3   amLODipine (NORVASC) 10 MG tablet TAKE 1 TABLET DAILY 90 tablet 2   apixaban (ELIQUIS) 5 MG TABS tablet Take 1 tablet (5 mg total) by mouth 2 (two) times daily. 180 tablet 3   ciclopirox (PENLAC) 8 % solution Apply topically.     COD LIVER OIL PO Take 1 capsule by mouth 2 (two) times daily. 10 mcg     dofetilide (TIKOSYN) 500 MCG capsule TAKE 1 CAPSULE BY MOUTH 2 TIMES A DAY 180 capsule 0   ezetimibe (ZETIA) 10 MG tablet TAKE 1 TABLET DAILY 90 tablet 0   gabapentin (NEURONTIN) 100 MG capsule TAKE 1 TO 2 CAPSULES THREE TIMES A DAY 100 capsule 2   isosorbide mononitrate (IMDUR) 30 MG 24 hr tablet Take 30 mg by mouth daily.     loratadine (CLARITIN) 10 MG tablet Take 1 tablet (10 mg total) by mouth daily.     losartan (COZAAR) 100 MG tablet TAKE 1 TABLET DAILY (MUST KEEP UPCOMING APPOINTMENT IN JULY 2024 WITH DR Ladona Ridgel BEFORE ANYMORE REFILLS, FINAL  ATTEMPT) 90 tablet 3   metFORMIN (GLUCOPHAGE) 850 MG tablet TAKE 1 TABLET TWICE A DAY WITH MEALS 180 tablet 3   nitroGLYCERIN (NITROSTAT) 0.4 MG SL tablet Place 1 tablet (0.4 mg total) under the tongue every 5 (five) minutes as needed for chest pain (max 3 doses). 25 tablet 6   nystatin (MYCOSTATIN/NYSTOP) powder Apply 1 Application topically 2 (two) times daily. Use until resolved and then 2 extra days. 60 g 1   Omega-3 Fatty Acids (FISH OIL) 1000 MG CAPS Take 1 capsule by mouth 2 (two) times daily.     pantoprazole (PROTONIX) 40 MG tablet Take 1 tablet (40 mg total) by mouth daily. 90 tablet 3   tamsulosin (FLOMAX) 0.4 MG CAPS capsule Take 0.4 mg by mouth daily.     timolol (BETIMOL) 0.5 % ophthalmic solution Place 1 drop into both eyes 2 (two) times daily.     spironolactone (ALDACTONE) 25 MG tablet Take 1 tablet (25 mg total) by mouth daily. 90 tablet 3   No current facility-administered medications for this visit.     Past Medical History:  Diagnosis Date   Anxiety    Back pain    Coronary artery disease 08/21/2013   a. 08/2013 s/p CABG x  6; b. 05/2022 PCI: LAD 155m, 60d, D2 50, LCX 80ost, 30m, 78m/d, OM1 100, RCA 100p, VG->OM2->OM3 nl, VG->D2 30ost, 99d @ Diag insertion (2.25x15 Onyx Frontier DES), VG->RCA nl, LIMA->LAD nl. EF 55-65%.   Diabetes mellitus    Diabetic neuropathy (HCC)    Diastolic dysfunction    a. 02/2017 Echo: EF 55-60%, no rwma, Gr1 DD, mildly dil Ao Root/RV. + PFO.   Diverticulosis    Diverticulosis    Embolic stroke (HCC)    a. 04/2017 MRI in setting of freq falls: small subacute infarction of bilat centrum semi-ovale consistent w/ embolic event-->Eliquis.   Erectile dysfunction    Family history of anesthesia complication    ' they cant wake my brother very easy"   Fatty liver    Flank pain    GERD (gastroesophageal reflux disease)    History of bronchitis    History of chicken pox    HSV infection    ocular symptoms and oral lesions    Hypercholesterolemia    Hypertension    Kidney stones    Osteoarthritis    PAF (paroxysmal atrial fibrillation) (HCC)    a. Had post-op AF 08/2013; b. 09/2015 Holter: PAC's no AF; c. 05/2017 Noted to be in AFib-->Eliquis (CHA2DS2VASc = 7); d. 08/2017 s/p DCCV-->ERAF-->Tikosyn added; e. 08/2020 Zio: 4% Afib burden.   PFO (patent foramen ovale)    a. 02/2017 Echo: + PFO.   Sinus node dysfunction (HCC)    a. 08/2020 Zio: Predominantly sinus bradycardia at 53 (35-211).  20 SVT runs.  1 run of nonsustained VT x4 beats.  4% A-fib burden.  16 pauses lasting up to 3.6 seconds.  Intermittent junctional rhythm.  Patient declined pacemaker.    ROS:   All systems reviewed and negative except as noted in the HPI.   Past Surgical History:  Procedure Laterality Date   CARDIAC CATHETERIZATION  08/21/2013   DR Clifton James   CARDIOVERSION N/A 08/24/2017   Procedure: CARDIOVERSION;  Surgeon: Antonieta Iba, MD;  Location: [facility] ORS;  Service: Cardiovascular;  Laterality: N/A;   CHOLECYSTECTOMY  1983   COLONOSCOPY WITH PROPOFOL N/A 01/04/2023   Procedure: COLONOSCOPY WITH PROPOFOL;  Surgeon: Wyline Mood, MD;  Location: [facility] ENDOSCOPY;  Service: Gastroenterology;  Laterality: N/A;   CORONARY ARTERY BYPASS GRAFT N/A 08/23/2013   Procedure: CORONARY ARTERY BYPASS GRAFTING (CABG);  Surgeon: Delight Ovens, MD;  Location: [facility] OR;  Service: Open Heart Surgery;  Laterality: N/A;  CABG times six utilizing the left internal mammary artery and the right greater saphenous vein harvested endoscopically   CORONARY STENT INTERVENTION N/A 05/16/2022   Procedure: CORONARY STENT INTERVENTION;  Surgeon: Iran Ouch, MD;  Location: [facility] INVASIVE CV LAB;  Service: Cardiovascular;  Laterality: N/A;   ENDOVEIN HARVEST OF GREATER SAPHENOUS VEIN Right 08/23/2013   Procedure: ENDOVEIN HARVEST OF GREATER SAPHENOUS VEIN;  Surgeon: Delight Ovens, MD;  Location: [facility] OR;  Service: Open Heart Surgery;  Laterality: Right;    ESOPHAGOGASTRODUODENOSCOPY (EGD) WITH PROPOFOL N/A 01/04/2023   Procedure: ESOPHAGOGASTRODUODENOSCOPY (EGD) WITH PROPOFOL;  Surgeon: Wyline Mood, MD;  Location[facility] ENDOSCOPY;  Service: Gastroenterology;  Laterality: N/A;   HERNIA REPAIR     X3   INTRAOPERATIVE TRANSESOPHAGEAL ECHOCARDIOGRAM N/A 08/23/2013   Procedure: INTRAOPERATIVE TRANSESOPHAGEAL ECHOCARDIOGRAM;  Surgeon: Delight Ovens, MD;  Location: [facility] OR;  Service: Open Heart Surgery;  Laterality: N/A;   LEFT HEART CATH AND CORS/GRAFTS ANGIOGRAPHY N/A 05/16/2022   Procedure: LEFT HEART CATH AND CORS/GRAFTS ANGIOGRAPHY;  Surgeon: Lorine Bears  A, MD;  Location: [facility] INVASIVE CV LAB;  Service: Cardiovascular;  Laterality: N/A;   LEFT HEART CATHETERIZATION WITH CORONARY ANGIOGRAM N/A 08/21/2013   Procedure: LEFT HEART CATHETERIZATION WITH CORONARY ANGIOGRAM;  Surgeon: Kathleene Hazel, MD;  Location: [facility] CATH LAB;  Service: Cardiovascular;  Laterality: N/A;     Family History  Problem Relation Age of Onset   Stroke Mother    Diabetes Mother    Heart disease Father        heart failure   Colon cancer Maternal Grandmother    Parkinsonism Maternal Grandfather    Stroke Paternal Grandfather    Prostate cancer Maternal Uncle    CAD Brother        CABG     Social History   Socioeconomic History   Marital status: Married    Spouse name: Not on file   Number of children: 2   Years of education: HS   Highest education level: Not on file  Occupational History   Occupation: Retired  Tobacco Use   Smoking status: Never   Smokeless tobacco: Never  Vaping Use   Vaping status: Never Used  Substance and Sexual Activity   Alcohol use: No    Alcohol/week: 0.0 standard drinks of alcohol   Drug use: No   Sexual activity: Not on file  Other Topics Concern   Not on file  Social History Narrative   Retired, remarried 1988   From [place], [place]   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   Right-handed.   2 cups caffeine daily   Social Determinants of Health   Financial Resource Strain: Low Risk  (02/07/2023)   Overall Financial Resource Strain (CARDIA)    Difficulty of Paying Living Expenses: Not hard at all  Food Insecurity: No Food Insecurity (02/07/2023)   Hunger Vital Sign    Worried About Running Out of Food in the Last Year: Never true    Ran Out of Food in the Last Year: Never true  Transportation Needs: No Transportation Needs (02/07/2023)   PRAPARE - Administrator, Civil Service (Medical): No    Lack of Transportation (Non-Medical): No  Physical Activity: Sufficiently Active (02/07/2023)   Exercise Vital Sign    Days of Exercise per Week: 5 days    Minutes of Exercise per Session: 60 min  Stress: No Stress Concern Present (02/07/2023)   [employer] of Occupational Health - Occupational Stress Questionnaire    Feeling of Stress : Not at all  Social Connections: Socially Integrated (02/07/2023)   Social Connection and Isolation Panel [NHANES]    Frequency of Communication with Friends and Family: More than three times a week    Frequency of Social Gatherings with Friends and Family: Three times a week    Attends Religious Services: More than 4 times per year    Active Member of Clubs or Organizations: Yes    Attends Banker Meetings: More than 4 times per year    Marital Status: Married  Catering manager Violence: Not At Risk (02/07/2023)   Humiliation, Afraid, Rape, and Kick questionnaire    Fear of Current or Ex-Partner: No    Emotionally Abused: No    Physically Abused: No    Sexually Abused: No     BP 110/60   Pulse 64   Ht 5\' 7"  (1.702 m)   Wt 159 lb (72.1 kg)   SpO2 95%   BMI 24.90  kg/m   Physical Exam:  Well appearing NAD HEENT: Unremarkable Neck:  No JVD, no thyromegally Lymphatics:  No adenopathy Back:  No CVA tenderness Lungs:  Clear HEART:  Regular rate rhythm, no murmurs, no rubs, no  clicks Abd:  soft, positive bowel sounds, no organomegally, no rebound, no guarding Ext:  2 plus pulses, no edema, no cyanosis, no clubbing Skin:  No rashes no nodules Neuro:  CN II through XII intact, motor grossly intact  EKG - afib with a controlled VR.    Assess/Plan: Atrial fib - his vr is controlled, and at times appears to be slow. He has known pauses. I offered him PPM insertion and he wishes to proceed.  HTN - his bp is well controlled. We will follow. Sinus node dysfunction - he will undergo PPM insertion.   Sharlot Gowda Chasady Longwell,MD

## 2023-06-14 NOTE — H&P (View-Only) (Signed)
HPI [name] returns today for followup. He is a pleasant 82 yo man with a h/o PAF who has been controlled with dofetilide. He also has HTN. 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 He has occasional break through palpitations. He gets lightheaded at times but denies syncope or near syncope. He has worn a cardiac monitor which demonstrates sinus bradycardia during the daytime in the 30's and daytime pauses of 3.5 seconds. He has NSVT. No sustained atrial fib though some brief NSSVT.  When I saw him last, I offered him a PPM due to 3.5 second pauses and he declined. He has continued to have syncope and his feels more and more palpitations. He is willing to have a PPM now. Allergies  Allergen Reactions   Fenofibrate Other (See Comments)    Muscle weakness/pain   Fire Ant     Allergic reaction   Statins Other (See Comments)    MYALGIA, muscle pain     Current Outpatient Medications  Medication Sig Dispense Refill   acyclovir (ZOVIRAX) 800 MG tablet TAKE 1 TABLET TWICE A DAY 180 tablet 3   amLODipine (NORVASC) 10 MG tablet TAKE 1 TABLET DAILY 90 tablet 2   apixaban (ELIQUIS) 5 MG TABS tablet Take 1 tablet (5 mg total) by mouth 2 (two) times daily. 180 tablet 3   ciclopirox (PENLAC) 8 % solution Apply topically.     COD LIVER OIL PO Take 1 capsule by mouth 2 (two) times daily. 10 mcg     dofetilide (TIKOSYN) 500 MCG capsule TAKE 1 CAPSULE BY MOUTH 2 TIMES A DAY 180 capsule 0   ezetimibe (ZETIA) 10 MG tablet TAKE 1 TABLET DAILY 90 tablet 0   gabapentin (NEURONTIN) 100 MG capsule TAKE 1 TO 2 CAPSULES THREE TIMES A DAY 100 capsule 2   isosorbide mononitrate (IMDUR) 30 MG 24 hr tablet Take 30 mg by mouth daily.     loratadine (CLARITIN) 10 MG tablet Take 1 tablet (10 mg total) by mouth daily.     losartan (COZAAR) 100 MG tablet TAKE 1 TABLET DAILY (MUST KEEP UPCOMING APPOINTMENT IN JULY 2024 WITH DR Ladona Ridgel BEFORE ANYMORE REFILLS, FINAL  ATTEMPT) 90 tablet 3   metFORMIN (GLUCOPHAGE) 850 MG tablet TAKE 1 TABLET TWICE A DAY WITH MEALS 180 tablet 3   nitroGLYCERIN (NITROSTAT) 0.4 MG SL tablet Place 1 tablet (0.4 mg total) under the tongue every 5 (five) minutes as needed for chest pain (max 3 doses). 25 tablet 6   nystatin (MYCOSTATIN/NYSTOP) powder Apply 1 Application topically 2 (two) times daily. Use until resolved and then 2 extra days. 60 g 1   Omega-3 Fatty Acids (FISH OIL) 1000 MG CAPS Take 1 capsule by mouth 2 (two) times daily.     pantoprazole (PROTONIX) 40 MG tablet Take 1 tablet (40 mg total) by mouth daily. 90 tablet 3   tamsulosin (FLOMAX) 0.4 MG CAPS capsule Take 0.4 mg by mouth daily.     timolol (BETIMOL) 0.5 % ophthalmic solution Place 1 drop into both eyes 2 (two) times daily.     spironolactone (ALDACTONE) 25 MG tablet Take 1 tablet (25 mg total) by mouth daily. 90 tablet 3   No current facility-administered medications for this visit.     Past Medical History:  Diagnosis Date   Anxiety    Back pain    Coronary artery disease 08/21/2013   a. 08/2013 s/p CABG x  6; b. 05/2022 PCI: LAD 13m, 60d, D2 50, LCX 80ost, 66m, 15m/d, OM1 100, RCA 100p, VG->OM2->OM3 nl, VG->D2 30ost, 99d @ Diag insertion (2.25x15 Onyx Frontier DES), VG->RCA nl, LIMA->LAD nl. EF 55-65%.   Diabetes mellitus    Diabetic neuropathy (HCC)    Diastolic dysfunction    a. 02/2017 Echo: EF 55-60%, no rwma, Gr1 DD, mildly dil Ao Root/RV. + PFO.   Diverticulosis    Diverticulosis    Embolic stroke (HCC)    a. 04/2017 MRI in setting of freq falls: small subacute infarction of bilat centrum semi-ovale consistent w/ embolic event-->Eliquis.   Erectile dysfunction    Family history of anesthesia complication    ' they cant wake my brother very easy"   Fatty liver    Flank pain    GERD (gastroesophageal reflux disease)    History of bronchitis    History of chicken pox    HSV infection    ocular symptoms and oral lesions    Hypercholesterolemia    Hypertension    Kidney stones    Osteoarthritis    PAF (paroxysmal atrial fibrillation) (HCC)    a. Had post-op AF 08/2013; b. 09/2015 Holter: PAC's no AF; c. 05/2017 Noted to be in AFib-->Eliquis (CHA2DS2VASc = 7); d. 08/2017 s/p DCCV-->ERAF-->Tikosyn added; e. 08/2020 Zio: 4% Afib burden.   PFO (patent foramen ovale)    a. 02/2017 Echo: + PFO.   Sinus node dysfunction (HCC)    a. 08/2020 Zio: Predominantly sinus bradycardia at 53 (35-211).  20 SVT runs.  1 run of nonsustained VT x4 beats.  4% A-fib burden.  16 pauses lasting up to 3.6 seconds.  Intermittent junctional rhythm.  Patient declined pacemaker.    ROS:   All systems reviewed and negative except as noted in the HPI.   Past Surgical History:  Procedure Laterality Date   CARDIAC CATHETERIZATION  08/21/2013   DR Clifton James   CARDIOVERSION N/A 08/24/2017   Procedure: CARDIOVERSION;  Surgeon: Antonieta Iba, MD;  Location: [facility] ORS;  Service: Cardiovascular;  Laterality: N/A;   CHOLECYSTECTOMY  1983   COLONOSCOPY WITH PROPOFOL N/A 01/04/2023   Procedure: COLONOSCOPY WITH PROPOFOL;  Surgeon: Wyline Mood, MD;  Location: [facility] ENDOSCOPY;  Service: Gastroenterology;  Laterality: N/A;   CORONARY ARTERY BYPASS GRAFT N/A 08/23/2013   Procedure: CORONARY ARTERY BYPASS GRAFTING (CABG);  Surgeon: Delight Ovens, MD;  Location: [facility] OR;  Service: Open Heart Surgery;  Laterality: N/A;  CABG times six utilizing the left internal mammary artery and the right greater saphenous vein harvested endoscopically   CORONARY STENT INTERVENTION N/A 05/16/2022   Procedure: CORONARY STENT INTERVENTION;  Surgeon: Iran Ouch, MD;  Location: [facility] INVASIVE CV LAB;  Service: Cardiovascular;  Laterality: N/A;   ENDOVEIN HARVEST OF GREATER SAPHENOUS VEIN Right 08/23/2013   Procedure: ENDOVEIN HARVEST OF GREATER SAPHENOUS VEIN;  Surgeon: Delight Ovens, MD;  Location: [facility] OR;  Service: Open Heart Surgery;  Laterality: Right;    ESOPHAGOGASTRODUODENOSCOPY (EGD) WITH PROPOFOL N/A 01/04/2023   Procedure: ESOPHAGOGASTRODUODENOSCOPY (EGD) WITH PROPOFOL;  Surgeon: Wyline Mood, MD;  Location: [facility] ENDOSCOPY;  Service: Gastroenterology;  Laterality: N/A;   HERNIA REPAIR     X3   INTRAOPERATIVE TRANSESOPHAGEAL ECHOCARDIOGRAM N/A 08/23/2013   Procedure: INTRAOPERATIVE TRANSESOPHAGEAL ECHOCARDIOGRAM;  Surgeon: Delight Ovens, MD;  Location: [facility] OR;  Service: Open Heart Surgery;  Laterality: N/A;   LEFT HEART CATH AND CORS/GRAFTS ANGIOGRAPHY N/A 05/16/2022   Procedure: LEFT HEART CATH AND CORS/GRAFTS ANGIOGRAPHY;  Surgeon: Lorine Bears  A, MD;  Location: [facility] INVASIVE CV LAB;  Service: Cardiovascular;  Laterality: N/A;   LEFT HEART CATHETERIZATION WITH CORONARY ANGIOGRAM N/A 08/21/2013   Procedure: LEFT HEART CATHETERIZATION WITH CORONARY ANGIOGRAM;  Surgeon: Kathleene Hazel, MD;  Location: [facility] CATH LAB;  Service: Cardiovascular;  Laterality: N/A;     Family History  Problem Relation Age of Onset   Stroke Mother    Diabetes Mother    Heart disease Father        heart failure   Colon cancer Maternal Grandmother    Parkinsonism Maternal Grandfather    Stroke Paternal Grandfather    Prostate cancer Maternal Uncle    CAD Brother        CABG     Social History   Socioeconomic History   Marital status: Married    Spouse name: Not on file   Number of children: 2   Years of education: HS   Highest education level: Not on file  Occupational History   Occupation: Retired  Tobacco Use   Smoking status: Never   Smokeless tobacco: Never  Vaping Use   Vaping status: Never Used  Substance and Sexual Activity   Alcohol use: No    Alcohol/week: 0.0 standard drinks of alcohol   Drug use: No   Sexual activity: Not on file  Other Topics Concern   Not on file  Social History Narrative   Retired, remarried 1988   From [place], [place]   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   Right-handed.   2 cups caffeine daily   Social Determinants of Health   Financial Resource Strain: Low Risk  (02/07/2023)   Overall Financial Resource Strain (CARDIA)    Difficulty of Paying Living Expenses: Not hard at all  Food Insecurity: No Food Insecurity (02/07/2023)   Hunger Vital Sign    Worried About Running Out of Food in the Last Year: Never true    Ran Out of Food in the Last Year: Never true  Transportation Needs: No Transportation Needs (02/07/2023)   PRAPARE - Administrator, Civil Service (Medical): No    Lack of Transportation (Non-Medical): No  Physical Activity: Sufficiently Active (02/07/2023)   Exercise Vital Sign    Days of Exercise per Week: 5 days    Minutes of Exercise per Session: 60 min  Stress: No Stress Concern Present (02/07/2023)   [employer] of Occupational Health - Occupational Stress Questionnaire    Feeling of Stress : Not at all  Social Connections: Socially Integrated (02/07/2023)   Social Connection and Isolation Panel [NHANES]    Frequency of Communication with Friends and Family: More than three times a week    Frequency of Social Gatherings with Friends and Family: Three times a week    Attends Religious Services: More than 4 times per year    Active Member of Clubs or Organizations: Yes    Attends Banker Meetings: More than 4 times per year    Marital Status: Married  Catering manager Violence: Not At Risk (02/07/2023)   Humiliation, Afraid, Rape, and Kick questionnaire    Fear of Current or Ex-Partner: No    Emotionally Abused: No    Physically Abused: No    Sexually Abused: No     BP 110/60   Pulse 64   Ht 5\' 7"  (1.702 m)   Wt 159 lb (72.1 kg)   SpO2 95%   BMI 24.90  kg/m   Physical Exam:  Well appearing NAD HEENT: Unremarkable Neck:  No JVD, no thyromegally Lymphatics:  No adenopathy Back:  No CVA tenderness Lungs:  Clear HEART:  Regular rate rhythm, no murmurs, no rubs, no  clicks Abd:  soft, positive bowel sounds, no organomegally, no rebound, no guarding Ext:  2 plus pulses, no edema, no cyanosis, no clubbing Skin:  No rashes no nodules Neuro:  CN II through XII intact, motor grossly intact  EKG - afib with a controlled VR.    Assess/Plan: Atrial fib - his vr is controlled, and at times appears to be slow. He has known pauses. I offered him PPM insertion and he wishes to proceed.  HTN - his bp is well controlled. We will follow. Sinus node dysfunction - he will undergo PPM insertion.   Sharlot Gowda Kirt Chew,MD

## 2023-06-15 LAB — CBC
Hematocrit: 42.6 % (ref 37.5–51.0)
Hemoglobin: 14.2 g/dL (ref 13.0–17.7)
MCH: 32.9 pg (ref 26.6–33.0)
MCHC: 33.3 g/dL (ref 31.5–35.7)
MCV: 99 fL — ABNORMAL HIGH (ref 79–97)
Platelets: 177 10*3/uL (ref 150–450)
RBC: 4.32 x10E6/uL (ref 4.14–5.80)
RDW: 12.7 % (ref 11.6–15.4)
WBC: 7.2 10*3/uL (ref 3.4–10.8)

## 2023-06-15 LAB — BASIC METABOLIC PANEL
BUN/Creatinine Ratio: 23 (ref 10–24)
BUN: 23 mg/dL (ref 8–27)
CO2: 24 mmol/L (ref 20–29)
Calcium: 9.9 mg/dL (ref 8.6–10.2)
Chloride: 103 mmol/L (ref 96–106)
Creatinine, Ser: 0.98 mg/dL (ref 0.76–1.27)
Glucose: 145 mg/dL — ABNORMAL HIGH (ref 70–99)
Potassium: 4.8 mmol/L (ref 3.5–5.2)
Sodium: 138 mmol/L (ref 134–144)
eGFR: 77 mL/min/{1.73_m2} (ref >59)

## 2023-06-17 ENCOUNTER — Other Ambulatory Visit: Payer: Self-pay | Admitting: Family Medicine

## 2023-06-26 ENCOUNTER — Encounter (HOSPITAL_BASED_OUTPATIENT_CLINIC_OR_DEPARTMENT_OTHER): Payer: Self-pay | Admitting: Cardiovascular Disease

## 2023-06-26 ENCOUNTER — Telehealth: Payer: Self-pay

## 2023-06-26 DIAGNOSIS — G4733 Obstructive sleep apnea (adult) (pediatric): Secondary | ICD-10-CM

## 2023-06-26 NOTE — Procedures (Signed)
Patient Name: [name] [name], Even Date: 06/05/2023 Gender: Male D.O.B: 1941/05/12 Age (years): 24 Referring Provider: Nicki Guadalajara MD, ABSM Height (inches): 66 Interpreting Physician: Nicki Guadalajara MD, ABSM Weight (lbs): 162 RPSGT: Lowry Ram BMI: 26 MRN: [id number] Neck Size: 15.00  CLINICAL INFORMATION Sleep Study Type: NPSG  Indication for sleep study: Diabetes, Hypertension, Snoring, Atrial fibrillation  Epworth Sleepiness Score: 6  SLEEP STUDY TECHNIQUE As per the AASM Manual for the Scoring of Sleep and Associated Events v2.3 (April 2016) with a hypopnea requiring 4% desaturations.  The channels recorded and monitored were frontal, central and occipital EEG, electrooculogram (EOG), submentalis EMG (chin), nasal and oral airflow, thoracic and abdominal wall motion, anterior tibialis EMG, snore microphone, electrocardiogram, and pulse oximetry.  MEDICATIONS acyclovir (ZOVIRAX) 800 MG table amLODipine (NORVASC) 10 MG tablet apixaban (ELIQUIS) 5 MG TABS tablet ciclopirox (PENLAC) 8 % solution COD LIVER OIL PO dofetilide (TIKOSYN) 500 MCG capsule ezetimibe (ZETIA) 10 MG tablet gabapentin (NEURONTIN) 100 MG capsule isosorbide mononitrate (IMDUR) 30 MG 24 hr tablet loratadine (CLARITIN) 10 MG tablet losartan (COZAAR) 100 MG tablet metFORMIN (GLUCOPHAGE) 850 MG tablet nitroGLYCERIN (NITROSTAT) 0.4 MG SL tablet nystatin (MYCOSTATIN/NYSTOP) powder Omega-3 Fatty Acids (FISH OIL) 1000 MG CAPS pantoprazole (PROTONIX) 40 MG tablet spironolactone (ALDACTONE) 25 MG tablet (Expired) tamsulosin (FLOMAX) 0.4 MG CAPS capsule timolol (BETIMOL) 0.5 % ophthalmic Medications self-administered by patient taken the night of the study : N/A  SLEEP ARCHITECTURE The study initiated at 10:18:25 PM and ended at 5:11:55 AM.  Sleep onset time was 13.1 minutes and the sleep efficiency was 61.8%. The total sleep time was 255.5 minutes.  Stage REM latency was 49.5  minutes.  The patient spent 7.0% of the night in stage N1 sleep, 71.6% in stage N2 sleep, 0.0% in stage N3 and 21.3% in REM.  Alpha intrusion was absent.  Supine sleep was 8.61%.  RESPIRATORY PARAMETERS The overall apnea/hypopnea index (AHI) was 29.1 per hour.  The respiratory disturbance index (RDI) was 34.3/h. There were 53 total apneas, including 28 obstructive, 18 central and 7 mixed apneas. There were 71 hypopneas and 22 RERAs.  The AHI during Stage REM sleep was 36.3 per hour.  AHI while supine was 84.5 per hour.  The mean oxygen saturation was 93.5%. The minimum SpO2 during sleep was 86.0%.  Moderate snoring was noted during this study.  CARDIAC DATA The 2 lead EKG demonstrated sinus rhythm. The mean heart rate was 52.4 beats per minute. Other EKG findings include: None.  LEG MOVEMENT DATA The total PLMS were 0 with a resulting PLMS index of 0.0. Associated arousal with leg movement index was 0.0 .  IMPRESSIONS - Moderately-severe obstructive sleep apnea occurred during this study (AH 29.1/h; RDI 34.3/h). Sleep apnea was severe during REM sleep (AHI 36.3/h) and very severe with supine sleep (AHI 84.5/h). - No significant central sleep apnea occurred during this study (CAI 4.2/h). - Mild oxygen desaturation to a nadir of 86.0%. - The patient snored with moderate snoring volume. - Reduced sleep efficiency at 61.8%. - No cardiac abnormalities were noted during this study. - Clinically significant periodic limb movements did not occur during sleep. No significant associated arousals.  DIAGNOSIS - Obstructive Sleep Apnea (G47.33) - Nocturnal Hypoxemia (G47.36)  RECOMMENDATIONS - Therapeutic CPAP titration to determine optimal pressure required to alleviate sleep disordered breathing. Recocommend an in-lab CPAP titration study; if unable then initiate a trial of Auto-PAP with EPR of 3 at 7 - 20 cm of water. - Positional therapy  avoiding supine position during sleep. - Effort  should be made to optimize nasal and oropharyngeal patency. - Avoid alcohol, sedatives and other CNS depressants that may worsen sleep apnea and disrupt normal sleep architecture. - Sleep hygiene should be reviewed to assess factors that may improve sleep quality. - Weight management and regular exercise should be initiated or continued if appropriate.  [Electronically signed] 06/26/2023 09:38 AM  Nicki Guadalajara MD, [facility], ABSM Diplomate, American Board of Sleep Medicine NPI: [id number]  [facility] PH: [phone]   FX: [phone] ACCREDITED BY THE AMERICAN ACADEMY OF SLEEP MEDICINE

## 2023-06-26 NOTE — Telephone Encounter (Signed)
CPAP Titration ordered

## 2023-06-26 NOTE — Telephone Encounter (Signed)
Notified patient of sleep study results and recommendations. All questions were answered and patient verbalized understanding. CPAP Titration ordered today.

## 2023-06-26 NOTE — Telephone Encounter (Signed)
-----   Message from Nicki Guadalajara sent at 06/26/2023  9:48 AM EST ----- Eliseo Gum, please notify pt and set up for in-lab CPAP titration study

## 2023-06-27 ENCOUNTER — Other Ambulatory Visit: Payer: Self-pay | Admitting: Nurse Practitioner

## 2023-07-02 NOTE — Pre-Procedure Instructions (Signed)
Instructed patient on the following items: Arrival time 0700 Nothing to eat or drink after midnight No meds AM of procedure Responsible person to drive you home and stay with you for 24 hrs Wash with special soap night before and morning of procedure If on anti-coagulant drug instructions Eliquis- last dose 11/22

## 2023-07-03 ENCOUNTER — Ambulatory Visit (HOSPITAL_COMMUNITY)
Admission: RE | Disposition: A | Discharge status: Home / Self Care | Payer: Self-pay | Source: Home / Self Care | Attending: Internal Medicine

## 2023-07-03 ENCOUNTER — Other Ambulatory Visit: Payer: Self-pay

## 2023-07-03 ENCOUNTER — Ambulatory Visit (HOSPITAL_COMMUNITY): Payer: Medicare Other

## 2023-07-03 ENCOUNTER — Ambulatory Visit (HOSPITAL_COMMUNITY)
Admission: RE | Admit: 2023-07-03 | Discharge: 2023-07-03 | Disposition: A | Discharge status: Home / Self Care | Payer: Medicare Other | Attending: Internal Medicine | Admitting: Internal Medicine

## 2023-07-03 DIAGNOSIS — Z951 Presence of aortocoronary bypass graft: Secondary | ICD-10-CM | POA: Diagnosis not present

## 2023-07-03 DIAGNOSIS — Z79899 Other long term (current) drug therapy: Secondary | ICD-10-CM | POA: Diagnosis not present

## 2023-07-03 DIAGNOSIS — I10 Essential (primary) hypertension: Secondary | ICD-10-CM | POA: Insufficient documentation

## 2023-07-03 DIAGNOSIS — I480 Paroxysmal atrial fibrillation: Secondary | ICD-10-CM | POA: Insufficient documentation

## 2023-07-03 DIAGNOSIS — I472 Ventricular tachycardia, unspecified: Secondary | ICD-10-CM | POA: Insufficient documentation

## 2023-07-03 DIAGNOSIS — Z950 Presence of cardiac pacemaker: Secondary | ICD-10-CM | POA: Diagnosis not present

## 2023-07-03 DIAGNOSIS — I495 Sick sinus syndrome: Secondary | ICD-10-CM | POA: Insufficient documentation

## 2023-07-03 HISTORY — PX: PACEMAKER IMPLANT: EP1218

## 2023-07-03 LAB — GLUCOSE, CAPILLARY
Glucose-Capillary: 132 mg/dL — ABNORMAL HIGH (ref 70–99)
Glucose-Capillary: 144 mg/dL — ABNORMAL HIGH (ref 70–99)

## 2023-07-03 SURGERY — PACEMAKER IMPLANT
Anesthesia: LOCAL

## 2023-07-03 MED ORDER — SODIUM CHLORIDE 0.9 % IV SOLN: INTRAVENOUS | Status: DC

## 2023-07-03 MED ORDER — POVIDONE-IODINE 10 % EX SWAB
2.0000 | Freq: Once | CUTANEOUS | Status: AC
  Administered 2023-07-03: 2 via TOPICAL

## 2023-07-03 MED ORDER — ONDANSETRON HCL 4 MG/2ML IJ SOLN: 4.0000 mg | Freq: Four times a day (QID) | INTRAMUSCULAR | Status: DC | PRN

## 2023-07-03 MED ORDER — CEFAZOLIN SODIUM-DEXTROSE 1-4 GM/50ML-% IV SOLN
1.0000 g | Freq: Once | INTRAVENOUS | Status: AC
  Administered 2023-07-03: 1 g via INTRAVENOUS
  Filled 2023-07-03 (×2): qty 50

## 2023-07-03 MED ORDER — FENTANYL CITRATE (PF) 100 MCG/2ML IJ SOLN
INTRAMUSCULAR | Status: DC | PRN
  Administered 2023-07-03: 25 ug via INTRAVENOUS

## 2023-07-03 MED ORDER — SODIUM CHLORIDE 0.9 % IV SOLN
80.0000 mg | INTRAVENOUS | Status: AC
  Administered 2023-07-03: 80 mg

## 2023-07-03 MED ORDER — FENTANYL CITRATE (PF) 100 MCG/2ML IJ SOLN
INTRAMUSCULAR | Status: AC
  Filled 2023-07-03: qty 2

## 2023-07-03 MED ORDER — HEPARIN (PORCINE) IN NACL 1000-0.9 UT/500ML-% IV SOLN
INTRAVENOUS | Status: DC | PRN
  Administered 2023-07-03: 500 mL

## 2023-07-03 MED ORDER — SODIUM CHLORIDE 0.9 % IV SOLN
INTRAVENOUS | Status: AC
  Filled 2023-07-03: qty 2

## 2023-07-03 MED ORDER — MIDAZOLAM HCL 5 MG/5ML IJ SOLN
INTRAMUSCULAR | Status: DC | PRN
  Administered 2023-07-03: 2 mg via INTRAVENOUS

## 2023-07-03 MED ORDER — MIDAZOLAM HCL 2 MG/2ML IJ SOLN
INTRAMUSCULAR | Status: AC
  Filled 2023-07-03: qty 2

## 2023-07-03 MED ORDER — CEFAZOLIN SODIUM-DEXTROSE 2-4 GM/100ML-% IV SOLN
2.0000 g | INTRAVENOUS | Status: AC
  Administered 2023-07-03: 2 g via INTRAVENOUS

## 2023-07-03 MED ORDER — CEFAZOLIN SODIUM-DEXTROSE 2-4 GM/100ML-% IV SOLN
INTRAVENOUS | Status: AC
  Filled 2023-07-03: qty 100

## 2023-07-03 MED ORDER — LIDOCAINE HCL (PF) 1 % IJ SOLN
INTRAMUSCULAR | Status: DC | PRN
  Administered 2023-07-03: 60 mL

## 2023-07-03 MED ORDER — CHLORHEXIDINE GLUCONATE 4 % EX SOLN
4.0000 | Freq: Once | CUTANEOUS | Status: DC
  Filled 2023-07-03: qty 60

## 2023-07-03 MED ORDER — ACETAMINOPHEN 325 MG PO TABS: 325.0000 mg | ORAL_TABLET | ORAL | Status: DC | PRN

## 2023-07-03 SURGICAL SUPPLY — 13 items
CABLE SURGICAL S-101-97-12 (CABLE) ×1 IMPLANT
CATH CPS LOCATOR 3D MED (CATHETERS) IMPLANT
HELIX LOCKING TOOL (MISCELLANEOUS) ×1
LEAD ULTIPACE 52 LPA1231/52 (Lead) IMPLANT
LEAD ULTIPACE 65 LPA1231/65 (Lead) IMPLANT
PACEMAKER ASSURITY DR-RF (Pacemaker) IMPLANT
PAD DEFIB RADIO PHYSIO CONN (PAD) ×1 IMPLANT
SHEATH 7FR PRELUDE SNAP 13 (SHEATH) IMPLANT
SHEATH 9FR PRELUDE SNAP 13 (SHEATH) IMPLANT
SLITTER AGILIS HISPRO (INSTRUMENTS) IMPLANT
TOOL HELIX LOCKING (MISCELLANEOUS) IMPLANT
TRAY PACEMAKER INSERTION (PACKS) ×1 IMPLANT
WIRE HI TORQ VERSACORE-J 145CM (WIRE) IMPLANT

## 2023-07-03 NOTE — Discharge Instructions (Signed)
After Your Pacemaker   You have a [employer] Pacemaker  ACTIVITY Do not lift your arm above shoulder height for 1 week after your procedure. After 7 days, you may progress as below.  You should remove your sling 24 hours after your procedure, unless otherwise instructed by your provider.     Monday July 10, 2023  Tuesday July 11, 2023 Wednesday July 12, 2023 Thursday July 13, 2023   Do not lift, push, pull, or carry anything over 10 pounds with the affected arm until 6 weeks (Monday August 14, 2023 ) after your procedure.   You may drive AFTER your wound check, unless you have been told otherwise by your provider.   Ask your healthcare provider when you can go back to work   INCISION/Dressing If you are on a blood thinner such as Coumadin, Xarelto, Eliquis, Plavix, or Pradaxa please confirm with your provider when this should be resumed. 07/08/23  If large square, outer bandage is left in place, this can be removed after 24 hours from your procedure. Do not remove steri-strips or glue as below.   If a PRESSURE DRESSING (a bulky dressing that usually goes up over your shoulder) was applied or left in place, please follow instructions given by your provider on when to return to have this removed.   Monitor your Pacemaker site for redness, swelling, and drainage. Call the device clinic at [phone] if you experience these symptoms or fever/chills.  If your incision is sealed with Steri-strips or staples, you may shower 7 days after your procedure or when told by your provider. Do not remove the steri-strips or let the shower hit directly on your site. You may wash around your site with soap and water.    If you were discharged in a sling, please do not wear this during the day more than 48 hours after your surgery unless otherwise instructed. This may increase the risk of stiffness and soreness in your shoulder.   Avoid lotions, ointments, or perfumes over your incision until  it is well-healed.  You may use a hot tub or a pool AFTER your wound check appointment if the incision is completely closed.  Pacemaker Alerts:  Some alerts are vibratory and others beep. These are NOT emergencies. Please call our office to let us know. If this occurs at night or on weekends, it can wait until the next business day. Send a remote transmission.  If your device is capable of reading fluid status (for heart failure), you will be offered monthly monitoring to review this with you.   DEVICE MANAGEMENT Remote monitoring is used to monitor your pacemaker from home. This monitoring is scheduled every 91 days by our office. It allows [place] to keep an eye on the functioning of your device to ensure it is working properly. You will routinely see your Electrophysiologist annually (more often if necessary).   You should receive your ID card for your new device in 4-8 weeks. Keep this card with you at all times once received. Consider wearing a medical alert bracelet or necklace.  Your Pacemaker may be MRI compatible. This will be discussed at your next office visit/wound check.  You should avoid contact with strong electric or magnetic fields.   Do not use amateur (ham) radio equipment or electric (arc) welding torches. MP3 player headphones with magnets should not be used. Some devices are safe to use if held at least 12 inches (30 cm) from your Pacemaker. These include power tools, lawn  mowers, and speakers. If you are unsure if something is safe to use, ask your health care provider.  When using your cell phone, hold it to the ear that is on the opposite side from the Pacemaker. Do not leave your cell phone in a pocket over the Pacemaker.  You may safely use electric blankets, heating pads, computers, and microwave ovens.  Call the office right away if: You have chest pain. You feel more short of breath than you have felt before. You feel more light-headed than you have felt before. Your  incision starts to open up.  This information is not intended to replace advice given to you by your health care provider. Make sure you discuss any questions you have with your health care provider.

## 2023-07-03 NOTE — Interval H&P Note (Signed)
History and Physical Interval Note:  07/03/2023 9:03 AM  [name] [name]  has presented today for surgery, with the diagnosis of bradycardia.  The various methods of treatment have been discussed with the patient and family. After consideration of risks, benefits and other options for treatment, the patient has consented to  Procedure(s): PACEMAKER IMPLANT (N/A) as a surgical intervention.  The patient's history has been reviewed, patient examined, no change in status, stable for surgery.  I have reviewed the patient's chart and labs.  Questions were answered to the patient's satisfaction.     Lewayne Bunting

## 2023-07-04 ENCOUNTER — Encounter (HOSPITAL_COMMUNITY): Payer: Self-pay | Admitting: Internal Medicine

## 2023-07-05 MED FILL — Midazolam HCl Inj 2 MG/2ML (Base Equivalent): INTRAMUSCULAR | Qty: 2 | Status: AC

## 2023-07-17 ENCOUNTER — Other Ambulatory Visit: Payer: Self-pay | Admitting: Physician Assistant

## 2023-07-17 ENCOUNTER — Other Ambulatory Visit: Payer: Self-pay | Admitting: Cardiovascular Disease

## 2023-07-17 NOTE — Telephone Encounter (Signed)
Hi,  Could you please advise if ok to send in refill? Med was previously prescribed by R. Dunn on 05/23/2022. Thank you so much.

## 2023-07-18 DIAGNOSIS — H0011 Chalazion right upper eyelid: Secondary | ICD-10-CM | POA: Diagnosis not present

## 2023-07-18 DIAGNOSIS — H0100A Unspecified blepharitis right eye, upper and lower eyelids: Secondary | ICD-10-CM | POA: Diagnosis not present

## 2023-07-19 ENCOUNTER — Ambulatory Visit: Payer: Medicare Other | Attending: Cardiology

## 2023-07-19 DIAGNOSIS — I495 Sick sinus syndrome: Secondary | ICD-10-CM

## 2023-07-19 LAB — CUP PACEART INCLINIC DEVICE CHECK
Battery Remaining Longevity: 82 mo
Battery Voltage: 3.04 V
Brady Statistic RA Percent Paced: 38 %
Brady Statistic RV Percent Paced: 41 %
Date Time Interrogation Session: 20241211170240
Implantable Lead Connection Status: 753985
Implantable Lead Connection Status: 753985
Implantable Lead Implant Date: 20241125
Implantable Lead Implant Date: 20241125
Implantable Lead Location: 753859
Implantable Lead Location: 753860
Implantable Pulse Generator Implant Date: 20241125
Lead Channel Impedance Value: 462.5 Ohm
Lead Channel Impedance Value: 475 Ohm
Lead Channel Pacing Threshold Amplitude: 0.5 V
Lead Channel Pacing Threshold Amplitude: 0.5 V
Lead Channel Pacing Threshold Pulse Width: 0.5 ms
Lead Channel Pacing Threshold Pulse Width: 0.5 ms
Lead Channel Sensing Intrinsic Amplitude: 12 mV
Lead Channel Sensing Intrinsic Amplitude: 5 mV
Lead Channel Setting Pacing Amplitude: 3.5 V
Lead Channel Setting Pacing Amplitude: 3.5 V
Lead Channel Setting Pacing Pulse Width: 0.5 ms
Lead Channel Setting Sensing Sensitivity: 2 mV
Pulse Gen Model: 2272
Pulse Gen Serial Number: 8231918

## 2023-07-19 NOTE — Patient Instructions (Signed)

## 2023-07-19 NOTE — Progress Notes (Signed)
Wound check appointment. Steri-strips removed. Wound without redness or edema. Incision edges approximated, wound well healed. Normal device function. Thresholds, sensing, and impedances consistent with implant measurements. Device programmed at 3.5V/auto capture programmed on for extra safety margin until 3 month visit. Histogram distribution appropriate for patient and level of activity. Patient educated about wound care, arm mobility, lifting restrictions. ROV in 3 months with implanting physician.  AF burden 50%

## 2023-07-20 ENCOUNTER — Other Ambulatory Visit: Payer: Self-pay | Admitting: Emergency Medicine

## 2023-07-20 MED ORDER — PANTOPRAZOLE SODIUM 40 MG PO TBEC: 40.0000 mg | DELAYED_RELEASE_TABLET | Freq: Every day | ORAL | 3 refills | Status: DC

## 2023-07-21 ENCOUNTER — Other Ambulatory Visit: Payer: Self-pay

## 2023-07-21 MED ORDER — PANTOPRAZOLE SODIUM 40 MG PO TBEC: 40.0000 mg | DELAYED_RELEASE_TABLET | Freq: Every day | ORAL | 3 refills | Status: DC

## 2023-07-31 ENCOUNTER — Ambulatory Visit: Payer: Medicare Other | Attending: Internal Medicine

## 2023-07-31 ENCOUNTER — Telehealth: Payer: Self-pay

## 2023-07-31 DIAGNOSIS — I495 Sick sinus syndrome: Secondary | ICD-10-CM

## 2023-07-31 NOTE — Telephone Encounter (Signed)
Picture received from Pt of incision site.  Appears Pt either has a scab at incision site or potentially a stitch.  Pt will come to office for wound check.

## 2023-07-31 NOTE — Patient Instructions (Signed)
Follow up as scheduled.  

## 2023-07-31 NOTE — Progress Notes (Signed)
Pt seen in device clinic for wound site assessment.  Pt with stitch at incision site.  Stitch clipped and site cleansed.  Covered with 2 small steristrips.  Pt will continue to monitor and call if any further issues.

## 2023-08-08 DIAGNOSIS — H0100A Unspecified blepharitis right eye, upper and lower eyelids: Secondary | ICD-10-CM | POA: Diagnosis not present

## 2023-08-08 DIAGNOSIS — H04123 Dry eye syndrome of bilateral lacrimal glands: Secondary | ICD-10-CM | POA: Diagnosis not present

## 2023-08-08 DIAGNOSIS — H0011 Chalazion right upper eyelid: Secondary | ICD-10-CM | POA: Diagnosis not present

## 2023-08-19 ENCOUNTER — Other Ambulatory Visit: Payer: Self-pay | Admitting: Cardiovascular Disease

## 2023-08-19 ENCOUNTER — Other Ambulatory Visit: Payer: Self-pay | Admitting: Nurse Practitioner

## 2023-09-08 DIAGNOSIS — H401130 Primary open-angle glaucoma, bilateral, stage unspecified: Secondary | ICD-10-CM | POA: Diagnosis not present

## 2023-09-13 ENCOUNTER — Other Ambulatory Visit: Payer: Self-pay | Admitting: Nurse Practitioner

## 2023-09-13 NOTE — Telephone Encounter (Signed)
 This is Dr. Gollan pt

## 2023-09-15 ENCOUNTER — Telehealth: Payer: Self-pay | Admitting: Cardiovascular Disease

## 2023-09-15 ENCOUNTER — Other Ambulatory Visit: Payer: Self-pay | Admitting: Nurse Practitioner

## 2023-09-15 DIAGNOSIS — H401131 Primary open-angle glaucoma, bilateral, mild stage: Secondary | ICD-10-CM | POA: Diagnosis not present

## 2023-09-15 NOTE — Telephone Encounter (Signed)
 Pharmacy has been updated.

## 2023-09-15 NOTE — Telephone Encounter (Signed)
 Patient came by office States his pharmacy is changing from [facility] to [facility] He would like all upcoming refills including Eliquis  to go to [facility]

## 2023-09-18 ENCOUNTER — Telehealth: Payer: Self-pay | Admitting: Cardiovascular Disease

## 2023-09-18 NOTE — Telephone Encounter (Signed)
 Pt c/o medication issue:  1. Name of Medication: dofetilide  (TIKOSYN ) 500 MCG capsule   metFORMIN  (GLUCOPHAGE ) 850 MG tablet  2. How are you currently taking this medication (dosage and times per day)? As written  3. Are you having a reaction (difficulty breathing--STAT)? No   4. What is your medication issue? Pharmacy would like a call back about pt taking both of these medication at the same time  [phone]  Ref# [id number]

## 2023-09-19 NOTE — Telephone Encounter (Signed)
Called [facility] and notified of the following from PharmD.  Patient is fine to take both metformin and tikosyn  [name] [name]

## 2023-10-02 ENCOUNTER — Ambulatory Visit (INDEPENDENT_AMBULATORY_CARE_PROVIDER_SITE_OTHER): Payer: Medicare Other

## 2023-10-02 DIAGNOSIS — I495 Sick sinus syndrome: Secondary | ICD-10-CM

## 2023-10-02 LAB — CUP PACEART REMOTE DEVICE CHECK
Battery Remaining Longevity: 64 mo
Battery Remaining Percentage: 95.5 %
Battery Voltage: 2.99 V
Brady Statistic AP VP Percent: 14 %
Brady Statistic AP VS Percent: 61 %
Brady Statistic AS VP Percent: 2.9 %
Brady Statistic AS VS Percent: 21 %
Brady Statistic RA Percent Paced: 48 %
Brady Statistic RV Percent Paced: 38 %
Date Time Interrogation Session: 20250224020013
Implantable Lead Connection Status: 753985
Implantable Lead Connection Status: 753985
Implantable Lead Implant Date: 20241125
Implantable Lead Implant Date: 20241125
Implantable Lead Location: 753859
Implantable Lead Location: 753860
Implantable Pulse Generator Implant Date: 20241125
Lead Channel Impedance Value: 400 Ohm
Lead Channel Impedance Value: 440 Ohm
Lead Channel Pacing Threshold Amplitude: 0.5 V
Lead Channel Pacing Threshold Pulse Width: 0.5 ms
Lead Channel Sensing Intrinsic Amplitude: 12 mV
Lead Channel Sensing Intrinsic Amplitude: 4.4 mV
Lead Channel Setting Pacing Amplitude: 3.5 V
Lead Channel Setting Pacing Amplitude: 3.5 V
Lead Channel Setting Pacing Pulse Width: 0.5 ms
Lead Channel Setting Sensing Sensitivity: 2 mV
Pulse Gen Model: 2272
Pulse Gen Serial Number: 8231918

## 2023-10-03 ENCOUNTER — Encounter: Payer: Self-pay | Admitting: Internal Medicine

## 2023-10-05 DIAGNOSIS — J300 Vasomotor rhinitis: Secondary | ICD-10-CM | POA: Diagnosis not present

## 2023-10-05 DIAGNOSIS — H903 Sensorineural hearing loss, bilateral: Secondary | ICD-10-CM | POA: Diagnosis not present

## 2023-10-05 DIAGNOSIS — J329 Chronic sinusitis, unspecified: Secondary | ICD-10-CM | POA: Diagnosis not present

## 2023-10-07 DIAGNOSIS — E119 Type 2 diabetes mellitus without complications: Secondary | ICD-10-CM | POA: Diagnosis not present

## 2023-10-12 ENCOUNTER — Other Ambulatory Visit: Payer: Self-pay | Admitting: Cardiovascular Disease

## 2023-10-17 ENCOUNTER — Encounter: Payer: Self-pay | Admitting: Internal Medicine

## 2023-10-17 ENCOUNTER — Ambulatory Visit: Payer: Medicare Other | Attending: Internal Medicine | Admitting: Internal Medicine

## 2023-10-17 VITALS — BP 140/64 | HR 67 | Ht 67.0 in | Wt 159.2 lb

## 2023-10-17 DIAGNOSIS — I455 Other specified heart block: Secondary | ICD-10-CM | POA: Diagnosis not present

## 2023-10-17 DIAGNOSIS — I4819 Other persistent atrial fibrillation: Secondary | ICD-10-CM

## 2023-10-17 LAB — CUP PACEART INCLINIC DEVICE CHECK
Battery Remaining Longevity: 106 mo
Battery Voltage: 2.98 V
Brady Statistic RA Percent Paced: 47 %
Brady Statistic RV Percent Paced: 39 %
Date Time Interrogation Session: 20250311121319
Implantable Lead Connection Status: 753985
Implantable Lead Connection Status: 753985
Implantable Lead Implant Date: 20241125
Implantable Lead Implant Date: 20241125
Implantable Lead Location: 753859
Implantable Lead Location: 753860
Implantable Pulse Generator Implant Date: 20241125
Lead Channel Impedance Value: 425 Ohm
Lead Channel Impedance Value: 437.5 Ohm
Lead Channel Pacing Threshold Amplitude: 0.5 V
Lead Channel Pacing Threshold Amplitude: 0.5 V
Lead Channel Pacing Threshold Pulse Width: 0.5 ms
Lead Channel Pacing Threshold Pulse Width: 0.5 ms
Lead Channel Sensing Intrinsic Amplitude: 12 mV
Lead Channel Sensing Intrinsic Amplitude: 5 mV
Lead Channel Setting Pacing Amplitude: 2.5 V
Lead Channel Setting Pacing Amplitude: 2.5 V
Lead Channel Setting Pacing Pulse Width: 0.5 ms
Lead Channel Setting Sensing Sensitivity: 2 mV
Pulse Gen Model: 2272
Pulse Gen Serial Number: 8231918

## 2023-10-17 NOTE — Patient Instructions (Signed)
 Medication Instructions:  Your physician recommends that you continue on your current medications as directed. Please refer to the Current Medication list given to you today.  *If you need a refill on your cardiac medications before your next appointment, please call your pharmacy*  Lab Work: None ordered.  You may go to any [facility] Location for your lab work:  [facility] - [address] ([facility]) - [address] [phone] [address]  [place] - [address]   [place]  - [address]   [place] - [address] - [address] [employer]  - [address] [place] - [address] ([facility]   If you have labs (blood work) drawn today and your tests are completely normal, you will receive your results only by: [employer] (if you have MyChart)  If you have any lab test that is abnormal or we need to change your treatment, we will call you or send a MyChart message to review the results.  Testing/Procedures: None ordered.  Follow-Up: At [facility], you and your health needs are our priority.  As part of our continuing mission to provide you with exceptional heart care, we have created designated Provider Care Teams.  These Care Teams include your primary Cardiologist (physician) and Advanced Practice Providers (APPs -  Physician Assistants and Nurse Practitioners) who all work together to provide you with the care you need, when you need it.  Your next appointment:   1 year(s)  The format for your next appointment:   In Person  Provider:   Lewayne Bunting, MD{or one of the following Advanced Practice Providers on your designated Care Team:   Francis Dowse, [place] Casimiro Needle "Mardelle Matte" [place], [place] Earnest Rosier, NP  Note: Remote monitoring is used to monitor your Pacemaker/ ICD from home. This monitoring reduces the number of office visits required to check your  device to one time per year. It allows [place] to keep an eye on the functioning of your device to ensure it is working properly.            Valet parking services will be available as well.

## 2023-10-17 NOTE — Progress Notes (Signed)
 HPI [name] [name] returns for followup of PAF and sinus node dysfunction. He is a pelasant 83 yo man with documenet pauses who underwent DDD PM insertion 3 months ago. He has done well in the interim with no chest pain. He does not feel his afib nearly as much since his PPM was placed. He denies syncope. 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 He has not had syncope or near syncope.  Allergies  Allergen Reactions   Fenofibrate Other (See Comments)    Muscle weakness/pain   Fire Ant     Allergic reaction   Statins Other (See Comments)    MYALGIA, muscle pain     Current Outpatient Medications  Medication Sig Dispense Refill   acyclovir (ZOVIRAX) 800 MG tablet TAKE 1 TABLET TWICE A DAY 180 tablet 3   amLODipine (NORVASC) 10 MG tablet TAKE 1 TABLET DAILY 90 tablet 2   apixaban (ELIQUIS) 5 MG TABS tablet Take 1 tablet (5 mg total) by mouth 2 (two) times daily. 180 tablet 3   carboxymethylcellulose (REFRESH TEARS) 0.5 % SOLN Place 1 drop into both eyes 2 (two) times daily.     COD LIVER OIL PO Take 10 mcg by mouth 2 (two) times daily.     dofetilide (TIKOSYN) 500 MCG capsule TAKE 1 CAPSULE BY MOUTH 2 TIMES A DAY 180 capsule 3   ezetimibe (ZETIA) 10 MG tablet TAKE 1 TABLET DAILY 90 tablet 3   gabapentin (NEURONTIN) 100 MG capsule TAKE 1 TO 2 CAPSULES THREE TIMES A DAY (Patient taking differently: Take 200 mg by mouth 3 (three) times daily as needed (neuropathy).) 100 capsule 2   isosorbide mononitrate (IMDUR) 30 MG 24 hr tablet TAKE 1 TABLET DAILY 90 tablet 3   losartan (COZAAR) 100 MG tablet TAKE 1 TABLET DAILY (MUST KEEP UPCOMING APPOINTMENT IN JULY 2024 WITH DR Ladona Ridgel BEFORE ANYMORE REFILLS, FINAL ATTEMPT) 90 tablet 3   metFORMIN (GLUCOPHAGE) 850 MG tablet TAKE 1 TABLET TWICE A DAY WITH MEALS 180 tablet 3   nitroGLYCERIN (NITROSTAT) 0.4 MG SL tablet DISSOLVE 1 TABLET UNDER THE TONGUE EVERY 5 MINUTES AS NEEDED FOR CHEST PAIN (MAXIMUM 3 DOSES) 25 tablet 44   nystatin (MYCOSTATIN/NYSTOP) powder  Apply 1 Application topically 2 (two) times daily. Use until resolved and then 2 extra days. 60 g 1   Omega-3 Fatty Acids (FISH OIL) 1000 MG CAPS Take 1,000 mg by mouth 2 (two) times daily. Wild English as a second language teacher     pantoprazole (PROTONIX) 40 MG tablet Take 1 tablet (40 mg total) by mouth daily. 90 tablet 3   spironolactone (ALDACTONE) 25 MG tablet TAKE 1 TABLET DAILY 90 tablet 3   No current facility-administered medications for this visit.     Past Medical History:  Diagnosis Date   Anxiety    Back pain    Coronary artery disease 08/21/2013   a. 08/2013 s/p CABG x 6; b. 05/2022 PCI: LAD 129m, 60d, D2 50, LCX 80ost, 53m, 50m/d, OM1 100, RCA 100p, VG->OM2->OM3 nl, VG->D2 30ost, 99d @ Diag insertion (2.25x15 Onyx Frontier DES), VG->RCA nl, LIMA->LAD nl. EF 55-65%.   Diabetes mellitus    Diabetic neuropathy (HCC)    Diastolic dysfunction    a. 02/2017 Echo: EF 55-60%, no rwma, Gr1 DD, mildly dil Ao Root/RV. + PFO.   Diverticulosis    Diverticulosis    Embolic stroke (HCC)    a. 04/2017 MRI in setting of freq falls: small subacute infarction of bilat centrum semi-ovale consistent w/ embolic  event-->Eliquis.   Erectile dysfunction    Family history of anesthesia complication    ' they cant wake my brother very easy"   Fatty liver    Flank pain    GERD (gastroesophageal reflux disease)    History of bronchitis    History of chicken pox    HSV infection    ocular symptoms and oral lesions   Hypercholesterolemia    Hypertension    Kidney stones    Osteoarthritis    PAF (paroxysmal atrial fibrillation) (HCC)    a. Had post-op AF 08/2013; b. 09/2015 Holter: PAC's no AF; c. 05/2017 Noted to be in AFib-->Eliquis (CHA2DS2VASc = 7); d. 08/2017 s/p DCCV-->ERAF-->Tikosyn added; e. 08/2020 Zio: 4% Afib burden.   PFO (patent foramen ovale)    a. 02/2017 Echo: + PFO.   Sinus node dysfunction (HCC)    a. 08/2020 Zio: Predominantly sinus bradycardia at 53 (35-211).  20 SVT runs.  1 run of nonsustained VT  x4 beats.  4% A-fib burden.  16 pauses lasting up to 3.6 seconds.  Intermittent junctional rhythm.  Patient declined pacemaker.    ROS:   All systems reviewed and negative except as noted in the HPI.   Past Surgical History:  Procedure Laterality Date   CARDIAC CATHETERIZATION  08/21/2013   DR Clifton James   CARDIOVERSION N/A 08/24/2017   Procedure: CARDIOVERSION;  Surgeon: Antonieta Iba, MD;  Location: [facility] ORS;  Service: Cardiovascular;  Laterality: N/A;   CHOLECYSTECTOMY  1983   COLONOSCOPY WITH PROPOFOL N/A 01/04/2023   Procedure: COLONOSCOPY WITH PROPOFOL;  Surgeon: Wyline Mood, MD;  Location: [facility] ENDOSCOPY;  Service: Gastroenterology;  Laterality: N/A;   CORONARY ARTERY BYPASS GRAFT N/A 08/23/2013   Procedure: CORONARY ARTERY BYPASS GRAFTING (CABG);  Surgeon: Delight Ovens, MD;  Location: [facility] OR;  Service: Open Heart Surgery;  Laterality: N/A;  CABG times six utilizing the left internal mammary artery and the right greater saphenous vein harvested endoscopically   CORONARY STENT INTERVENTION N/A 05/16/2022   Procedure: CORONARY STENT INTERVENTION;  Surgeon: Iran Ouch, MD;  Location: [facility] INVASIVE CV LAB;  Service: Cardiovascular;  Laterality: N/A;   ENDOVEIN HARVEST OF GREATER SAPHENOUS VEIN Right 08/23/2013   Procedure: ENDOVEIN HARVEST OF GREATER SAPHENOUS VEIN;  Surgeon: Delight Ovens, MD;  Location: [facility] OR;  Service: Open Heart Surgery;  Laterality: Right;   ESOPHAGOGASTRODUODENOSCOPY (EGD) WITH PROPOFOL N/A 01/04/2023   Procedure: ESOPHAGOGASTRODUODENOSCOPY (EGD) WITH PROPOFOL;  Surgeon: Wyline Mood, MD;  Location: [facility] ENDOSCOPY;  Service: Gastroenterology;  Laterality: N/A;   HERNIA REPAIR     X3   INTRAOPERATIVE TRANSESOPHAGEAL ECHOCARDIOGRAM N/A 08/23/2013   Procedure: INTRAOPERATIVE TRANSESOPHAGEAL ECHOCARDIOGRAM;  Surgeon: Delight Ovens, MD;  Location: [facility] OR;  Service: Open Heart Surgery;  Laterality: N/A;   LEFT HEART CATH AND CORS/GRAFTS ANGIOGRAPHY N/A  05/16/2022   Procedure: LEFT HEART CATH AND CORS/GRAFTS ANGIOGRAPHY;  Surgeon: Iran Ouch, MD;  Location: [facility] INVASIVE CV LAB;  Service: Cardiovascular;  Laterality: N/A;   LEFT HEART CATHETERIZATION WITH CORONARY ANGIOGRAM N/A 08/21/2013   Procedure: LEFT HEART CATHETERIZATION WITH CORONARY ANGIOGRAM;  Surgeon: Kathleene Hazel, MD;  Location: [facility] CATH LAB;  Service: Cardiovascular;  Laterality: N/A;   PACEMAKER IMPLANT N/A 07/03/2023   Procedure: PACEMAKER IMPLANT;  Surgeon: Marinus Maw, MD;  Location: [facility] INVASIVE CV LAB;  Service: Cardiovascular;  Laterality: N/A;     Family History  Problem Relation Age of Onset   Stroke Mother    Diabetes Mother  Heart disease Father        heart failure   Colon cancer Maternal Grandmother    Parkinsonism Maternal Grandfather    Stroke Paternal Grandfather    Prostate cancer Maternal Uncle    CAD Brother        CABG     Social History   Socioeconomic History   Marital status: Married    Spouse name: Not on file   Number of children: 2   Years of education: HS   Highest education level: Not on file  Occupational History   Occupation: Retired  Tobacco Use   Smoking status: Never   Smokeless tobacco: Never  Vaping Use   Vaping status: Never Used  Substance and Sexual Activity   Alcohol use: No    Alcohol/week: 0.0 standard drinks of alcohol   Drug use: No   Sexual activity: Not on file  Other Topics Concern   Not on file  Social History Narrative   Retired, remarried 1988   From [place], [place]   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   Right-handed.   2 cups caffeine daily   Social Drivers of Health   Financial Resource Strain: Low Risk  (02/07/2023)   Overall Financial Resource Strain (CARDIA)    Difficulty of Paying Living Expenses: Not hard at all  Food Insecurity: No Food Insecurity (02/07/2023)   Hunger Vital Sign    Worried About Running Out of Food in  the Last Year: Never true    Ran Out of Food in the Last Year: Never true  Transportation Needs: No Transportation Needs (02/07/2023)   PRAPARE - Administrator, Civil Service (Medical): No    Lack of Transportation (Non-Medical): No  Physical Activity: Sufficiently Active (02/07/2023)   Exercise Vital Sign    Days of Exercise per Week: 5 days    Minutes of Exercise per Session: 60 min  Stress: No Stress Concern Present (02/07/2023)   [employer] of Occupational Health - Occupational Stress Questionnaire    Feeling of Stress : Not at all  Social Connections: Socially Integrated (02/07/2023)   Social Connection and Isolation Panel [NHANES]    Frequency of Communication with Friends and Family: More than three times a week    Frequency of Social Gatherings with Friends and Family: Three times a week    Attends Religious Services: More than 4 times per year    Active Member of Clubs or Organizations: Yes    Attends Banker Meetings: More than 4 times per year    Marital Status: Married  Catering manager Violence: Not At Risk (02/07/2023)   Humiliation, Afraid, Rape, and Kick questionnaire    Fear of Current or Ex-Partner: No    Emotionally Abused: No    Physically Abused: No    Sexually Abused: No     BP (!) 140/64   Pulse 67   Ht 5\' 7"  (1.702 m)   Wt 159 lb 3.2 oz (72.2 kg)   SpO2 95%   BMI 24.93 kg/m   Physical Exam:  Well appearing elderly man, NAD HEENT: Unremarkable Neck:  No JVD, no thyromegally Lymphatics:  No adenopathy Back:  No CVA tenderness Lungs:  Clear with no wheezes HEART:  IRegular rate rhythm, no murmurs, no rubs, no clicks Abd:  soft, positive bowel sounds, no organomegally, no rebound, no guarding Ext:  2 plus pulses, no edema, no cyanosis, no clubbing Skin:  No  rashes no nodules Neuro:  CN II through XII intact, motor grossly intact  EKG - afib with ventricular pacing  DEVICE  Normal device function.  See PaceArt for  details.   Assess/Plan: Persistent atrial fib - he is out of rhythm today and did not know it. He will continue dofetilide as he has been in rhythm 2/3 of the time and if that worsens we might consider stopping this medication. Heart block/sinus node dysfunction - he is asymptomatic s/p PPM insertion. PPM - he is doing well and his device is working normally. Coags - he has not had any bleeding on eliquis. We will follow.  Sharlot Gowda Tirth Cothron,MD

## 2023-11-02 ENCOUNTER — Telehealth: Payer: Self-pay | Admitting: Cardiovascular Disease

## 2023-11-02 NOTE — Telephone Encounter (Signed)
*  STAT* If patient is at the pharmacy, call can be transferred to refill team.   1. Which medications need to be refilled? (please list name of each medication and dose if known) Dofetilide 500 MCG capsule   2. Would you like to learn more about the convenience, safety, & potential cost savings by using the [facility]? no     3. Are you open to using the [facility] (Type [facility]. no ).   4. Which pharmacy/location (including street and city if local pharmacy) is medication to be sent to?[facility]   5. Do they need a 30 day or 90 day supply? 90

## 2023-11-03 NOTE — Telephone Encounter (Signed)
 Dr. Mariah Milling has been refilling this medication for years for this pt. This is a Educational psychologist pt. Please address

## 2023-11-07 DIAGNOSIS — E119 Type 2 diabetes mellitus without complications: Secondary | ICD-10-CM | POA: Diagnosis not present

## 2023-11-08 NOTE — Progress Notes (Signed)
 Remote pacemaker transmission.

## 2023-11-13 ENCOUNTER — Telehealth: Payer: Self-pay | Admitting: Cardiovascular Disease

## 2023-11-13 ENCOUNTER — Other Ambulatory Visit: Payer: Self-pay

## 2023-11-13 MED ORDER — DOFETILIDE 500 MCG PO CAPS: 500.0000 ug | ORAL_CAPSULE | Freq: Two times a day (BID) | ORAL | 3 refills | Status: AC

## 2023-11-13 NOTE — Telephone Encounter (Signed)
 Pt's pharmacy would like a c/b regarding what pt's last Creatine level was. Please advise

## 2023-11-13 NOTE — Progress Notes (Unsigned)
 Medication refilled by Dr Mariah Milling on 10/12/23 [facility] to [facility]

## 2023-11-13 NOTE — Telephone Encounter (Signed)
 Patient stopped by stating he still needs this prescription filled from [facility] to [facility] but is upset that he hasn't been able to get this refilled. Please advise patient at phone number on file.  Says he is almost of this medication Dofetilide and needs some asap before he runs out of it.

## 2023-11-13 NOTE — Telephone Encounter (Signed)
 Called pharmacy- they are requesting a lot of information for refill on Tikosyn (weights, last lab work, making sure docs are aware patient is on Metformin)- advised I would get with Dr.Taylor's nurse on this RX.  Looks like patient has been on this medication for a while- thanks!

## 2023-11-13 NOTE — Telephone Encounter (Unsigned)
 Medication was filled by Dr Mariah Milling on 3/6 and was sent to wrong pharmacy. Pharmacy changed to [facility].

## 2023-11-14 ENCOUNTER — Other Ambulatory Visit: Payer: Self-pay

## 2023-11-14 ENCOUNTER — Telehealth: Payer: Self-pay | Admitting: Cardiovascular Disease

## 2023-11-14 NOTE — Telephone Encounter (Signed)
 Afib clinic consulted. See 4/8 phone note. Closing encounter.

## 2023-11-14 NOTE — Telephone Encounter (Signed)
 Pt c/o medication issue:  1. Name of Medication:   dofetilide (TIKOSYN) 500 MCG capsule   2. How are you currently taking this medication (dosage and times per day)?   3. Are you having a reaction (difficulty breathing--STAT)?   4. What is your medication issue?   Caller Lynden Ang) stated this is the first time they are filling this medication for the patient and they will need to verify:  1)  if patient has been taking this medication previously 2)  patient's creatinine level 3)  if there a drug interaction with patient taking metFORMIN (GLUCOPHAGE) 850 MG tablet   Caller provided reference number [id number].

## 2023-11-14 NOTE — Telephone Encounter (Signed)
 Pharmacy called after speaking with [name] [name] in Afib clinic (who has since documented on chart) Pharmacy is filling prescription and should have no other questions about medication.

## 2023-11-14 NOTE — Telephone Encounter (Addendum)
 1) patient has been on Tikosyn since Feb 2019 2) latest creatinine was 0.89 3) Ok for [employer] and metformin. QT closely monitored.

## 2023-11-16 ENCOUNTER — Other Ambulatory Visit: Payer: Self-pay | Admitting: Cardiovascular Disease

## 2023-11-16 DIAGNOSIS — I4819 Other persistent atrial fibrillation: Secondary | ICD-10-CM

## 2023-11-16 NOTE — Telephone Encounter (Signed)
 Prescription refill request for Eliquis received. Indication: Afib  Last office visit: 10/17/23 Ladona Ridgel)  Scr: 0.98 (06/14/23)  Age: 83 Weight: 72.2kg Appropriate dose. Refill sent.

## 2023-11-16 NOTE — Telephone Encounter (Signed)
 Refill request

## 2023-12-06 ENCOUNTER — Other Ambulatory Visit: Payer: Self-pay

## 2023-12-06 MED ORDER — AMLODIPINE BESYLATE 10 MG PO TABS: 10.0000 mg | ORAL_TABLET | Freq: Every day | ORAL | 0 refills | Status: DC

## 2023-12-07 DIAGNOSIS — E119 Type 2 diabetes mellitus without complications: Secondary | ICD-10-CM | POA: Diagnosis not present

## 2023-12-11 ENCOUNTER — Other Ambulatory Visit: Payer: Self-pay

## 2023-12-11 MED ORDER — SPIRONOLACTONE 25 MG PO TABS: 25.0000 mg | ORAL_TABLET | Freq: Every day | ORAL | 3 refills | Status: AC

## 2023-12-19 ENCOUNTER — Encounter: Payer: Self-pay | Admitting: Physician Assistant

## 2023-12-19 ENCOUNTER — Ambulatory Visit: Attending: Physician Assistant | Admitting: Physician Assistant

## 2023-12-19 VITALS — BP 116/56 | HR 62 | Ht 67.0 in | Wt 154.0 lb

## 2023-12-19 DIAGNOSIS — I4819 Other persistent atrial fibrillation: Secondary | ICD-10-CM | POA: Diagnosis not present

## 2023-12-19 DIAGNOSIS — I495 Sick sinus syndrome: Secondary | ICD-10-CM

## 2023-12-19 DIAGNOSIS — Z950 Presence of cardiac pacemaker: Secondary | ICD-10-CM

## 2023-12-19 DIAGNOSIS — Z789 Other specified health status: Secondary | ICD-10-CM

## 2023-12-19 DIAGNOSIS — E785 Hyperlipidemia, unspecified: Secondary | ICD-10-CM

## 2023-12-19 DIAGNOSIS — I25810 Atherosclerosis of coronary artery bypass graft(s) without angina pectoris: Secondary | ICD-10-CM

## 2023-12-19 DIAGNOSIS — R252 Cramp and spasm: Secondary | ICD-10-CM | POA: Diagnosis not present

## 2023-12-19 DIAGNOSIS — Z79899 Other long term (current) drug therapy: Secondary | ICD-10-CM

## 2023-12-19 DIAGNOSIS — I10 Essential (primary) hypertension: Secondary | ICD-10-CM

## 2023-12-19 DIAGNOSIS — Z951 Presence of aortocoronary bypass graft: Secondary | ICD-10-CM

## 2023-12-19 MED ORDER — AMLODIPINE BESYLATE 10 MG PO TABS: 10.0000 mg | ORAL_TABLET | Freq: Every day | ORAL | 3 refills | Status: DC

## 2023-12-19 NOTE — Progress Notes (Signed)
 Cardiology Office Note    Date:  12/19/2023   ID:  [name] [name], DOB 1940/08/21, MRN [id number]  PCP:  Donnie Galea, MD  Cardiologist:  Belva Boyden, MD  Electrophysiologist:  None   Chief Complaint: Lower extremity cramp  History of Present Illness:   [name] [name] is a 83 y.o. male with history of CAD status post 6-vessel CABG in 2015, status post PCI/DES to the anastomosis of SVG to D2 in 05/2022, persistent A-fib on Tikosyn  managed by EP, sinus node dysfunction status post PPM in 06/2023, CVA, DM2, HTN, HLD, NSVT, PFO, and dilated aortic root who presents for evaluation of lower extremity cramping.  He underwent 6 vessel CABG in 08/2013 with postoperative A-fib noted that resolved following a short course of amiodarone  and brief period of warfarin.  He had palpitations in early 2017 with Holter monitoring at that time showing sinus rhythm with PACs. In spring 2018, he was evaluated by neurology in the setting of falls. 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 It was felt that this was most likely explained by paroxysmal atrial fibrillation and he was placed on Eliquis  5 mg twice daily. He was subsequently found to be in atrial fibrillation cardiology clinic in 05/2017 and referred to A-fib clinic. Recommendation was made for Tikosyn  initiation though patient deferred. He subsequently underwent cardioversion in January 2019 which was initially successful however, he had return of A-fib and associated fatigue within approximately 4 days. He was referred back to A-fib clinic and subsequently was admitted and loaded with Tikosyn . In early 2022, he suffered a syncopal episode. He subsequently wore an event monitor which showed predominantly sinus bradycardia at 53 bpm with a range of 35 to 211 bpm. He had 20 brief runs of SVT and a 4% A-fib burden. He also had episodic junctional rhythm and 16 pauses lasting up to 3.6  seconds. He followed up with electrophysiology Carolynne Citron) in March 2022 and was offered permanent pacemaker insertion but patient wished to defer.  In the setting of unstable angina he underwent LHC in 05/2022 that showed severe underlying three-vessel CAD with patent grafts including LIMA to LAD, SVG to OM 2/OM 3, SVG to RCA and SVG to D2.  The SVG to D2 had a severe anastomosis of stenosis which was felt to be the culprit for his angina.  He underwent successful PCI/DES to the anastomosis of SVG to D2 extending into the native vessel.  He underwent repeat outpatient cardiac monitoring in 03/2023 that showed 36% A-fib burden (while on Tikosyn ) with previously noted pauses occurring up to 3.9 seconds.  He subsequently underwent a sleep study.  In the setting of outpatient cardiac monitoring findings he was evaluated by EP and agreed to undergo PPM implantation which was placed in 06/2023.  Device interrogations since pacemaker implantation have demonstrated persistent A-fib burden ranging from 36% to up to 60% (while on Tikosyn ).  He contacted our office reporting left lower extremity cramping overnight and appointment was scheduled for today.  He comes in today reporting a history of intermittent lower extremity cramps, particularly at night that typically resolve with a banana.  Last evening he had a left lower extremity cramp that persisted with 2 bananas and mustard.  No symptoms of angina or cardiac decompensation.  No palpitations, dizziness, presyncope, or syncope.  No falls or symptoms concerning for bleeding.  No claudication.  Upon waking up this morning left lower extremity  cramp had resolved.   Labs independently reviewed: 06/2023 - BUN 23, serum creatinine 0.98, potassium 4.8, Hgb 14.2, PLT 177 02/2023 - direct LDL 67, TC 147, TG 531, A1c 7.9 12/2022 - albumin  3.9, AST/ALT normal 09/2022 - magnesium  2.2, TSH normal  Past Medical History:  Diagnosis Date   Anxiety    Back pain    Coronary artery  disease 08/21/2013   a. 08/2013 s/p CABG x 6; b. 05/2022 PCI: LAD 175m, 60d, D2 50, LCX 80ost, 34m, 47m/d, OM1 100, RCA 100p, VG->OM2->OM3 nl, VG->D2 30ost, 99d @ Diag insertion (2.25x15 Onyx Frontier DES), VG->RCA nl, LIMA->LAD nl. EF 55-65%.   Diabetes mellitus    Diabetic neuropathy (HCC)    Diastolic dysfunction    a. 02/2017 Echo: EF 55-60%, no rwma, Gr1 DD, mildly dil Ao Root/RV. + PFO.   Diverticulosis    Diverticulosis    Embolic stroke (HCC)    a. 04/2017 MRI in setting of freq falls: small subacute infarction of bilat centrum semi-ovale consistent w/ embolic event-->Eliquis .   Erectile dysfunction    Family history of anesthesia complication    ' they cant wake my brother very easy"   Fatty liver    Flank pain    GERD (gastroesophageal reflux disease)    History of bronchitis    History of chicken pox    HSV infection    ocular symptoms and oral lesions   Hypercholesterolemia    Hypertension    Kidney stones    Osteoarthritis    PAF (paroxysmal atrial fibrillation) (HCC)    a. Had post-op AF 08/2013; b. 09/2015 Holter: PAC's no AF; c. 05/2017 Noted to be in AFib-->Eliquis  (CHA2DS2VASc = 7); d. 08/2017 s/p DCCV-->ERAF-->Tikosyn  added; e. 08/2020 Zio: 4% Afib burden.   PFO (patent foramen ovale)    a. 02/2017 Echo: + PFO.   Sinus node dysfunction (HCC)    a. 08/2020 Zio: Predominantly sinus bradycardia at 53 (35-211).  20 SVT runs.  1 run of nonsustained VT x4 beats.  4% A-fib burden.  16 pauses lasting up to 3.6 seconds.  Intermittent junctional rhythm.  Patient declined pacemaker.    Past Surgical History:  Procedure Laterality Date   CARDIAC CATHETERIZATION  08/21/2013   DR Abel Hoe   CARDIOVERSION N/A 08/24/2017   Procedure: CARDIOVERSION;  Surgeon: Devorah Fonder, MD;  Location: [facility] ORS;  Service: Cardiovascular;  Laterality: N/A;   CHOLECYSTECTOMY  1983   COLONOSCOPY WITH PROPOFOL  N/A 01/04/2023   Procedure: COLONOSCOPY WITH PROPOFOL ;  Surgeon: Luke Salaam, MD;   Location: [facility] ENDOSCOPY;  Service: Gastroenterology;  Laterality: N/A;   CORONARY ARTERY BYPASS GRAFT N/A 08/23/2013   Procedure: CORONARY ARTERY BYPASS GRAFTING (CABG);  Surgeon: Norita Beauvais, MD;  Location: [facility] OR;  Service: Open Heart Surgery;  Laterality: N/A;  CABG times six utilizing the left internal mammary artery and the right greater saphenous vein harvested endoscopically   CORONARY STENT INTERVENTION N/A 05/16/2022   Procedure: CORONARY STENT INTERVENTION;  Surgeon: Wenona Hamilton, MD;  Location: [facility] INVASIVE CV LAB;  Service: Cardiovascular;  Laterality: N/A;   ENDOVEIN HARVEST OF GREATER SAPHENOUS VEIN Right 08/23/2013   Procedure: ENDOVEIN HARVEST OF GREATER SAPHENOUS VEIN;  Surgeon: Norita Beauvais, MD;  Location: [facility] OR;  Service: Open Heart Surgery;  Laterality: Right;   ESOPHAGOGASTRODUODENOSCOPY (EGD) WITH PROPOFOL  N/A 01/04/2023   Procedure: ESOPHAGOGASTRODUODENOSCOPY (EGD) WITH PROPOFOL ;  Surgeon: Luke Salaam, MD;  Location: [facility] ENDOSCOPY;  Service: Gastroenterology;  Laterality: N/A;   HERNIA REPAIR  X3   INTRAOPERATIVE TRANSESOPHAGEAL ECHOCARDIOGRAM N/A 08/23/2013   Procedure: INTRAOPERATIVE TRANSESOPHAGEAL ECHOCARDIOGRAM;  Surgeon: Norita Beauvais, MD;  Location: [facility] OR;  Service: Open Heart Surgery;  Laterality: N/A;   LEFT HEART CATH AND CORS/GRAFTS ANGIOGRAPHY N/A 05/16/2022   Procedure: LEFT HEART CATH AND CORS/GRAFTS ANGIOGRAPHY;  Surgeon: Wenona Hamilton, MD;  Location: [facility] INVASIVE CV LAB;  Service: Cardiovascular;  Laterality: N/A;   LEFT HEART CATHETERIZATION WITH CORONARY ANGIOGRAM N/A 08/21/2013   Procedure: LEFT HEART CATHETERIZATION WITH CORONARY ANGIOGRAM;  Surgeon: Odie Benne, MD;  Location: [facility] CATH LAB;  Service: Cardiovascular;  Laterality: N/A;   PACEMAKER IMPLANT N/A 07/03/2023   Procedure: PACEMAKER IMPLANT;  Surgeon: Tammie Fall, MD;  Location: [facility] INVASIVE CV LAB;  Service: Cardiovascular;  Laterality: N/A;    Current  Medications: Current Meds  Medication Sig   acyclovir  (ZOVIRAX ) 800 MG tablet TAKE 1 TABLET TWICE A DAY   apixaban  (ELIQUIS ) 5 MG TABS tablet TAKE 1 TABLET TWICE A DAY   carboxymethylcellulose (REFRESH TEARS) 0.5 % SOLN Place 1 drop into both eyes 2 (two) times daily.   COD LIVER OIL PO Take 10 mcg by mouth 2 (two) times daily.   dofetilide  (TIKOSYN ) 500 MCG capsule Take 1 capsule (500 mcg total) by mouth 2 (two) times daily.   ezetimibe  (ZETIA ) 10 MG tablet TAKE 1 TABLET DAILY   gabapentin  (NEURONTIN ) 100 MG capsule TAKE 1 TO 2 CAPSULES THREE TIMES A DAY (Patient taking differently: Take 200 mg by mouth 3 (three) times daily as needed (neuropathy).)   isosorbide  mononitrate (IMDUR ) 30 MG 24 hr tablet TAKE 1 TABLET DAILY   losartan  (COZAAR ) 100 MG tablet TAKE 1 TABLET DAILY (MUST KEEP UPCOMING APPOINTMENT IN JULY 2024 WITH DR Carolynne Citron BEFORE ANYMORE REFILLS, FINAL ATTEMPT)   metFORMIN  (GLUCOPHAGE ) 850 MG tablet TAKE 1 TABLET TWICE A DAY WITH MEALS   nitroGLYCERIN  (NITROSTAT ) 0.4 MG SL tablet DISSOLVE 1 TABLET UNDER THE TONGUE EVERY 5 MINUTES AS NEEDED FOR CHEST PAIN (MAXIMUM 3 DOSES)   nystatin  (MYCOSTATIN /NYSTOP ) powder Apply 1 Application topically 2 (two) times daily. Use until resolved and then 2 extra days.   Omega-3 Fatty Acids (FISH OIL) 1000 MG CAPS Take 1,000 mg by mouth 2 (two) times daily. Wild English as a second language teacher   pantoprazole  (PROTONIX ) 40 MG tablet Take 1 tablet (40 mg total) by mouth daily.   spironolactone  (ALDACTONE ) 25 MG tablet Take 1 tablet (25 mg total) by mouth daily.   [DISCONTINUED] amLODipine  (NORVASC ) 10 MG tablet Take 1 tablet (10 mg total) by mouth daily. PLEASE CALL [phone] TO SCHEDULE AN APPOINTMENT PRIOR TO NEXT REFILL REQUEST. THANK YOU.    Allergies:   Fenofibrate , Animator, and Statins   Social History   Socioeconomic History   Marital status: Married    Spouse name: Not on file   Number of children: 2   Years of education: HS   Highest education level:  Not on file  Occupational History   Occupation: Retired  Tobacco Use   Smoking status: Never   Smokeless tobacco: Never  Vaping Use   Vaping status: Never Used  Substance and Sexual Activity   Alcohol use: No    Alcohol/week: 0.0 standard drinks of alcohol   Drug use: No   Sexual activity: Not on file  Other Topics Concern   Not on file  Social History Narrative   Retired, remarried 1988   From [place], [place]   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   Right-handed.   2 cups caffeine daily   Social Drivers of Health   Financial Resource Strain: Low Risk  (02/07/2023)   Overall Financial Resource Strain (CARDIA)    Difficulty of Paying Living Expenses: Not hard at all  Food Insecurity: No Food Insecurity (02/07/2023)   Hunger Vital Sign    Worried About Running Out of Food in the Last Year: Never true    Ran Out of Food in the Last Year: Never true  Transportation Needs: No Transportation Needs (02/07/2023)   PRAPARE - Administrator, Civil Service (Medical): No    Lack of Transportation (Non-Medical): No  Physical Activity: Sufficiently Active (02/07/2023)   Exercise Vital Sign    Days of Exercise per Week: 5 days    Minutes of Exercise per Session: 60 min  Stress: No Stress Concern Present (02/07/2023)   [employer] of Occupational Health - Occupational Stress Questionnaire    Feeling of Stress : Not at all  Social Connections: Socially Integrated (02/07/2023)   Social Connection and Isolation Panel [NHANES]    Frequency of Communication with Friends and Family: More than three times a week    Frequency of Social Gatherings with Friends and Family: Three times a week    Attends Religious Services: More than 4 times per year    Active Member of Clubs or Organizations: Yes    Attends Engineer, structural: More than 4 times per year    Marital Status: Married     Family History:  The patient's family  history includes CAD in his brother; Colon cancer in his maternal grandmother; Diabetes in his mother; Heart disease in his father; Parkinsonism in his maternal grandfather; Prostate cancer in his maternal uncle; Stroke in his mother and paternal grandfather.  ROS:   12-point review of systems is negative unless otherwise noted in the HPI.   EKGs/Labs/Other Studies Reviewed:    Studies reviewed were summarized above. The additional studies were reviewed today:  Zio patch 03/2023: Normal sinus rhythm Patient had a min HR of 32 bpm, max HR of 135 bpm, and avg HR of 61 bpm.    Atrial Fibrillation occurred (36% burden), ranging from 32-135 bpm (avg of 68 bpm), the longest lasting 2 days 19 hours with an avg  rate of 64 bpm.    22 Pauses occurred, the longest lasting 3.9 secs (15 bpm).  Pauses noted overnight 1 to 3 AM   Isolated SVEs were occasional (1.3%, 15634), SVE Couplets were rare (<1.0%, 2244), and SVE Triplets were rare (<1.0%, 496).    Isolated VEs were rare (<1.0%, 522), VE Couplets were rare (<1.0%, 9), and VE Triplets were rare (<1.0%, 3).    Ventricular Bigeminy was present.    No patient triggered events recorded __________  [facility] 05/16/2022:   Mid LAD lesion is 100% stenosed.   Ost Cx to Prox Cx lesion is 80% stenosed.   Mid Cx lesion is 90% stenosed.   Prox RCA lesion is 100% stenosed.   1st Mrg lesion is 100% stenosed.   Mid Cx to Dist Cx lesion is 90% stenosed.   Origin lesion is 30% stenosed.   Insertion lesion is 99% stenosed.   2nd Diag lesion is 50% stenosed.   Dist LAD lesion is 60% stenosed.   A drug-eluting stent was successfully placed using a STENT ONYX FRONTIER 2.25X15.   Post intervention, there is a 10% residual stenosis.   SVG graft was  visualized by angiography and is normal in caliber.   SVG graft was visualized by angiography and is normal in caliber.   LIMA graft was visualized by angiography and is normal in caliber.   The graft exhibits no  disease.   The graft exhibits minimal luminal irregularities.   The graft exhibits no disease.   The left ventricular systolic function is normal.   LV end diastolic pressure is mildly elevated.   The left ventricular ejection fraction is 55-65% by visual estimate.   1.  Severe underlying three-vessel coronary artery disease with patent grafts including LIMA to LAD, SVG to OM 2/OM 3, SVG to RCA and SVG to second diagonal.  The SVG to second diagonal has severe anastomosis stenosis which is the likely culprit for angina. 2.  Normal LV systolic function and mildly elevated left ventricular end-diastolic pressure. 3.  Successful angioplasty and drug-eluting stent placement to the anastomosis of SVG to second diagonal extending into the native vessel.  Difficult procedure due to very fibrotic lesion with difficulty delivering a stent.  A guide extension had to be used.   Recommendations: We will use dual antiplatelet therapy for now.  Once Eliquis  is resumed, aspirin  can be discontinued.  Eliquis  has been on hold due to interaction with an antibiotic that he takes for his prostate infection. The patient can likely be discharged home tomorrow.  EKG:  EKG is not ordered today.   Recent Labs: 12/23/2022: ALT 25 06/14/2023: BUN 23; Creatinine, Ser 0.98; Hemoglobin 14.2; Platelets 177; Potassium 4.8; Sodium 138  Recent Lipid Panel    Component Value Date/Time   CHOL 147 02/10/2023 0815   TRIG (H) 02/10/2023 0815    531.0 Triglyceride is over 400; calculations on Lipids are invalid.   HDL 26.80 (L) 02/10/2023 0815   CHOLHDL 5 02/10/2023 0815   VLDL 44.0 (H) 12/16/2021 1255   LDLCALC 62 06/07/2017 0738   LDLDIRECT 67.0 02/10/2023 0815    PHYSICAL EXAM:    VS:  BP (!) 116/56 (BP Location: Left Arm, Patient Position: Sitting, Cuff Size: Normal)   Pulse 62   Ht 5\' 7"  (1.702 m)   Wt 154 lb (69.9 kg)   SpO2 95%   BMI 24.12 kg/m   BMI: Body mass index is 24.12 kg/m.  Physical Exam Vitals  reviewed.  Constitutional:      Appearance: He is well-developed.  HENT:     Head: Normocephalic and atraumatic.  Eyes:     General:        Right eye: No discharge.        Left eye: No discharge.  Cardiovascular:     Rate and Rhythm: Normal rate and regular rhythm.     Pulses:          Posterior tibial pulses are 2+ on the right side and 2+ on the left side.     Heart sounds: Normal heart sounds, S1 normal and S2 normal. Heart sounds not distant. No midsystolic click and no opening snap. No murmur heard.    No friction rub.  Pulmonary:     Effort: Pulmonary effort is normal. No respiratory distress.     Breath sounds: Normal breath sounds. No decreased breath sounds, wheezing, rhonchi or rales.  Chest:     Chest wall: No tenderness.  Musculoskeletal:     Cervical back: Normal range of motion.     Right lower leg: No edema.     Left lower leg: No edema.  Skin:  General: Skin is warm and dry.     Nails: There is no clubbing.  Neurological:     Mental Status: He is alert and oriented to person, place, and time.  Psychiatric:        Speech: Speech normal.        Behavior: Behavior normal.        Thought Content: Thought content normal.        Judgment: Judgment normal.     Wt Readings from Last 3 Encounters:  12/19/23 154 lb (69.9 kg)  10/17/23 159 lb 3.2 oz (72.2 kg)  07/03/23 160 lb (72.6 kg)     ASSESSMENT & PLAN:   Left lower extremity cramping: Resolved.  Check BMP and magnesium .  Trial of holding ezetimibe  for 1 week to see if this improves cramping burden.  If so, would need to pursue alternative lipid-lowering therapy.  Persistent A-fib: Pacemaker interrogations have shown an A-fib burden of 36% to 60% while on Tikosyn .  EP is aware, ongoing management deferred to them.  Remains on Tikosyn  with QTc followed by EP.  Potassium 4.8 in 06/2023 with magnesium  2.2 in 09/2022.  Maintain potassium and magnesium  at goal 4.0 and 2.0 respectively.  Check BMP and magnesium  to  ensure potassium and magnesium  are at goal.  CHA2DS2-VASc at least 7 (HTN, age x 2, DM, CVA x 2, vascular disease).  He remains on apixaban  5 mg twice daily and does not meet reduced dosing criteria.  Follow-up with EP as directed.  CAD status post CABG status post subsequent PCI without angina: He is doing well and without symptoms concerning for angina.  On apixaban  in place of aspirin  to minimize bleeding risk given underlying A-fib.  Continue aggressive risk factor modification and second prevention including amlodipine  10 mg and Imdur  30 mg.  Trial of holding ezetimibe  as outlined above to see if this improves the cramping burden.  If so, would need to pursue alternative lipid-lowering pharmacotherapy.  No indication for ischemic testing at this time.  Sinus node dysfunction: Status post PPM.  Followed by EP.  HTN: Blood pressure is well-controlled in the office today.  She remains on amlodipine  10 mg, Imdur  30 mg, and losartan  100 mg.  HLD: LDL 67 in 02/2023.  Trial of holding ezetimibe  as outlined above.  If this improves his cramping burden we will need to pursue alternative lipid-lowering therapy.  Intolerant to statin secondary to myalgias.    Disposition: F/u with Dr. Gollan or an APP in 6 months and EP as directed.   Medication Adjustments/Labs and Tests Ordered: Current medicines are reviewed at length with the patient today.  Concerns regarding medicines are outlined above. Medication changes, Labs and Tests ordered today are summarized above and listed in the Patient Instructions accessible in Encounters.   Signed, Varney Gentleman, PA-C 12/19/2023 1:27 PM     [facility] -  [address] [place], [place] [address] [phone]

## 2023-12-19 NOTE — Patient Instructions (Addendum)
 Medication Instructions:  Your physician recommends the following medication changes.  STOP TAKING: Zetia  for 1 week  *If you need a refill on your cardiac medications before your next appointment, please call your pharmacy*  Lab Work: Your provider would like for you to have following labs drawn today BMet, CBC, Magnesium .   If you have labs (blood work) drawn today and your tests are completely normal, you will receive your results only by: MyChart Message (if you have MyChart) OR A paper copy in the mail If you have any lab test that is abnormal or we need to change your treatment, we will call you to review the results.   Follow-Up: At [facility], you and your health needs are our priority.  As part of our continuing mission to provide you with exceptional heart care, our providers are all part of one team.  This team includes your primary Cardiologist (physician) and Advanced Practice Providers or APPs (Physician Assistants and Nurse Practitioners) who all work together to provide you with the care you need, when you need it.  Your next appointment:   6 month(s)  Provider:   Harvie Liner, MD, Ardeen Kohler, MD, or Adaline Holly, NP    We recommend signing up for the patient portal called "MyChart".  Sign up information is provided on this After Visit Summary.  MyChart is used to connect with patients for Virtual Visits (Telemedicine).  Patients are able to view lab/test results, encounter notes, upcoming appointments, etc.  Non-urgent messages can be sent to your provider as well.   To learn more about what you can do with MyChart, go to [email or web address].   Other Instructions Prescription for Amlodipine  90 day supply with 3 refills has been sent to your pharmacy.

## 2023-12-20 ENCOUNTER — Ambulatory Visit: Payer: Self-pay | Admitting: Physician Assistant

## 2023-12-20 LAB — BASIC METABOLIC PANEL WITH GFR
BUN/Creatinine Ratio: 16 (ref 10–24)
BUN: 15 mg/dL (ref 8–27)
CO2: 23 mmol/L (ref 20–29)
Calcium: 10 mg/dL (ref 8.6–10.2)
Chloride: 100 mmol/L (ref 96–106)
Creatinine, Ser: 0.91 mg/dL (ref 0.76–1.27)
Glucose: 106 mg/dL — ABNORMAL HIGH (ref 70–99)
Potassium: 4.9 mmol/L (ref 3.5–5.2)
Sodium: 141 mmol/L (ref 134–144)
eGFR: 84 mL/min/{1.73_m2} (ref >59)

## 2023-12-20 LAB — CBC
Hematocrit: 43.5 % (ref 37.5–51.0)
Hemoglobin: 14.8 g/dL (ref 13.0–17.7)
MCH: 33.3 pg — ABNORMAL HIGH (ref 26.6–33.0)
MCHC: 34 g/dL (ref 31.5–35.7)
MCV: 98 fL — ABNORMAL HIGH (ref 79–97)
Platelets: 187 10*3/uL (ref 150–450)
RBC: 4.45 x10E6/uL (ref 4.14–5.80)
RDW: 12.2 % (ref 11.6–15.4)
WBC: 6.1 10*3/uL (ref 3.4–10.8)

## 2023-12-20 LAB — MAGNESIUM: Magnesium: 2.1 mg/dL (ref 1.6–2.3)

## 2024-01-02 ENCOUNTER — Ambulatory Visit: Payer: Medicare Other

## 2024-01-02 DIAGNOSIS — I495 Sick sinus syndrome: Secondary | ICD-10-CM | POA: Diagnosis not present

## 2024-01-02 DIAGNOSIS — I4819 Other persistent atrial fibrillation: Secondary | ICD-10-CM

## 2024-01-03 LAB — CUP PACEART REMOTE DEVICE CHECK
Battery Remaining Longevity: 98 mo
Battery Remaining Percentage: 95.5 %
Battery Voltage: 2.99 V
Brady Statistic AP VP Percent: 14 %
Brady Statistic AP VS Percent: 62 %
Brady Statistic AS VP Percent: 2.6 %
Brady Statistic AS VS Percent: 21 %
Brady Statistic RA Percent Paced: 48 %
Brady Statistic RV Percent Paced: 39 %
Date Time Interrogation Session: 20250527040015
Implantable Lead Connection Status: 753985
Implantable Lead Connection Status: 753985
Implantable Lead Implant Date: 20241125
Implantable Lead Implant Date: 20241125
Implantable Lead Location: 753859
Implantable Lead Location: 753860
Implantable Pulse Generator Implant Date: 20241125
Lead Channel Impedance Value: 410 Ohm
Lead Channel Impedance Value: 440 Ohm
Lead Channel Pacing Threshold Amplitude: 0.5 V
Lead Channel Pacing Threshold Pulse Width: 0.5 ms
Lead Channel Sensing Intrinsic Amplitude: 12 mV
Lead Channel Sensing Intrinsic Amplitude: 5 mV
Lead Channel Setting Pacing Amplitude: 2.5 V
Lead Channel Setting Pacing Amplitude: 2.5 V
Lead Channel Setting Pacing Pulse Width: 0.5 ms
Lead Channel Setting Sensing Sensitivity: 2 mV
Pulse Gen Model: 2272
Pulse Gen Serial Number: 8231918

## 2024-01-05 ENCOUNTER — Ambulatory Visit: Payer: Self-pay | Admitting: Internal Medicine

## 2024-01-07 DIAGNOSIS — E119 Type 2 diabetes mellitus without complications: Secondary | ICD-10-CM | POA: Diagnosis not present

## 2024-01-10 ENCOUNTER — Telehealth: Payer: Self-pay | Admitting: Cardiovascular Disease

## 2024-01-10 NOTE — Telephone Encounter (Signed)
 Patient calling to follow up on CPAP.  Patient verbalized he has not receive his CPAP. Made patient aware that this will be follow to sleep study team. Patient verbalized an understanding.

## 2024-01-10 NOTE — Telephone Encounter (Signed)
 Patient is requesting to speak with Dr. Arlana Bellini nurse. Please advise.

## 2024-01-18 ENCOUNTER — Other Ambulatory Visit: Payer: Self-pay | Admitting: Family Medicine

## 2024-01-18 NOTE — Telephone Encounter (Signed)
 LOV:04/03/23 NWG:NFAOZHY SCHEDULED LAST REFILL:gabapentin  (NEURONTIN ) 100 MG capsule 06/08/23 100 CAPSULE 2 REFILLS

## 2024-01-24 DIAGNOSIS — L814 Other melanin hyperpigmentation: Secondary | ICD-10-CM | POA: Diagnosis not present

## 2024-01-24 DIAGNOSIS — L821 Other seborrheic keratosis: Secondary | ICD-10-CM | POA: Diagnosis not present

## 2024-01-24 DIAGNOSIS — D225 Melanocytic nevi of trunk: Secondary | ICD-10-CM | POA: Diagnosis not present

## 2024-01-24 DIAGNOSIS — D2262 Melanocytic nevi of left upper limb, including shoulder: Secondary | ICD-10-CM | POA: Diagnosis not present

## 2024-01-24 DIAGNOSIS — D485 Neoplasm of uncertain behavior of skin: Secondary | ICD-10-CM | POA: Diagnosis not present

## 2024-01-24 DIAGNOSIS — L570 Actinic keratosis: Secondary | ICD-10-CM | POA: Diagnosis not present

## 2024-01-29 NOTE — Telephone Encounter (Signed)
**Note De-Identified [name] [name] Obfuscation** Per the [facility] Provider Poirtal: Code Description                                                                                                       Pre-Authorization                     Solution [phone] Sleep study in sleep lab with continuous airway pressure (6 years or older)      Pre-Authorization is not Required Sleep Management  I have transferred the order to the Sleep Lab and I called the pt to make him aware but got no answer so I left a message on his VM advising him that I am sending him a [facility] message and that if he has any questions or concerns after reading it to call Macario back at Dr Joesphine office at [facility] at [phone].

## 2024-02-06 DIAGNOSIS — E119 Type 2 diabetes mellitus without complications: Secondary | ICD-10-CM | POA: Diagnosis not present

## 2024-02-12 ENCOUNTER — Ambulatory Visit (INDEPENDENT_AMBULATORY_CARE_PROVIDER_SITE_OTHER): Payer: Medicare Other

## 2024-02-12 VITALS — BP 116/56 | Ht 67.0 in | Wt 156.0 lb

## 2024-02-12 DIAGNOSIS — Z0000 Encounter for general adult medical examination without abnormal findings: Secondary | ICD-10-CM | POA: Diagnosis not present

## 2024-02-12 NOTE — Patient Instructions (Signed)
 [name] , Thank you for taking time out of your busy schedule to complete your Annual Wellness Visit with me. I enjoyed our conversation and look forward to speaking with you again next year. I, as well as your care team,  appreciate your ongoing commitment to your health goals. Please review the following plan we discussed and let me know if I can assist you in the future. Your Game plan/ To Do List    Referrals: If you haven't heard from the office you've been referred to, please reach out to them at the phone provided.  None  Follow up Visits: Next Medicare AWV with our clinical staff: 02/13/2025   Have you seen your provider in the last 6 months (3 months if uncontrolled diabetes)? Yes Next Office Visit with your provider: 04/02/2024= Clinician Recommendations:  Aim for 30 minutes of exercise or brisk walking, 6-8 glasses of water , and 5 servings of fruits and vegetables each day.       This is a list of the screening recommended for you and due dates:  Health Maintenance  Topic Date Due   Yearly kidney health urinalysis for diabetes  Never done   Complete foot exam   11/04/2020   COVID-19 Vaccine (8 - 2024-25 season) 04/09/2023   Hemoglobin A1C  08/13/2023   Flu Shot  03/08/2024   Eye exam for diabetics  04/11/2024   Yearly kidney function blood test for diabetes  12/18/2024   Medicare Annual Wellness Visit  02/11/2025   DTaP/Tdap/Td vaccine (3 - Td or Tdap) 10/25/2028   Pneumococcal Vaccine for age over 24  Completed   Zoster (Shingles) Vaccine  Completed   Hepatitis B Vaccine  Aged Out   HPV Vaccine  Aged Out   Meningitis B Vaccine  Aged Out    Advanced directives: (Declined) Advance directive discussed with you today. Even though you declined this today, please call our office should you change your mind, and we can give you the proper paperwork for you to fill out. Advance Care Planning is important because it:  [x]  Makes sure you receive the medical care that is  consistent with your values, goals, and preferences  [x]  It provides guidance to your family and loved ones and reduces their decisional burden about whether or not they are making the right decisions based on your wishes.  Follow the link provided in your after visit summary or read over the paperwork we have mailed to you to help you started getting your Advance Directives in place. If you need assistance in completing these, please reach out to us  so that we can help you!  See attachments for Preventive Care and Fall Prevention Tips.

## 2024-02-12 NOTE — Progress Notes (Signed)
 Because this visit was a virtual/telehealth visit,  certain criteria was not obtained, such a blood pressure, CBG if applicable, and timed get up and go. Any medications not marked as taking were not mentioned during the medication reconciliation part of the visit. Any vitals not documented were not able to be obtained due to this being a telehealth visit or patient was unable to self-report a recent blood pressure reading due to a lack of equipment at home via telehealth. Vitals that have been documented are verbally provided by the patient.   This visit was performed by a medical professional under my direct supervision. I was immediately available for consultation/collaboration. I have reviewed and agree with the Annual Wellness Visit documentation.  Subjective:   [name] [name] is a 83 y.o. who presents for a Medicare Wellness preventive visit.  As a reminder, Annual Wellness Visits don't include a physical exam, and some assessments may be limited, especially if this visit is performed virtually. We may recommend an in-person follow-up visit with your provider if needed.  Visit Complete: Virtual I connected with  [name] [name] on 02/12/24 by a audio enabled telemedicine application and verified that I am speaking with the correct person using two identifiers.  Patient Location: Home  Provider Location: Home Office  I discussed the limitations of evaluation and management by telemedicine. The patient expressed understanding and agreed to proceed.  Vital Signs: Because this visit was a virtual/telehealth visit, some criteria may be missing or patient reported. Any vitals not documented were not able to be obtained and vitals that have been documented are patient reported.  VideoDeclined- This patient declined Librarian, academic. Therefore the visit was completed with audio only.  Persons Participating in Visit: Patient.  AWV Questionnaire: No: Patient Medicare AWV  questionnaire was not completed prior to this visit.  Cardiac Risk Factors include: advanced age (>22men, >53 women);male gender;hypertension;diabetes mellitus;dyslipidemia     Objective:    Today's Vitals   02/12/24 1154 02/12/24 1156  BP: (!) 116/56   Weight: 156 lb (70.8 kg)   Height: 5' 7 (1.702 m)   PainSc:  5    Body mass index is 24.43 kg/m.     02/12/2024   12:04 PM 07/03/2023    7:29 AM 06/05/2023    7:51 PM 02/07/2023    9:11 AM 01/15/2023    4:20 AM 01/04/2023   12:17 PM 09/24/2022    9:01 AM  Advanced Directives  Does Patient Have a Medical Advance Directive? Yes No No No No No No  Type of Estate agent of [place];Living will        Does patient want to make changes to medical advance directive? No - Patient declined        Copy of Healthcare Power of Attorney in Chart? No - copy requested        Would patient like information on creating a medical advance directive?  No - Guardian declined No - Patient declined Yes (MAU/Ambulatory/Procedural Areas - Information given)  No - Patient declined     Current Medications (verified) Outpatient Encounter Medications as of 02/12/2024  Medication Sig   acyclovir  (ZOVIRAX ) 800 MG tablet TAKE 1 TABLET TWICE A DAY   amLODipine  (NORVASC ) 10 MG tablet Take 1 tablet (10 mg total) by mouth daily.   apixaban  (ELIQUIS ) 5 MG TABS tablet TAKE 1 TABLET TWICE A DAY   carboxymethylcellulose (REFRESH TEARS) 0.5 % SOLN Place 1 drop into both eyes 2 (two)  times daily.   COD LIVER OIL PO Take 10 mcg by mouth 2 (two) times daily.   dofetilide  (TIKOSYN ) 500 MCG capsule Take 1 capsule (500 mcg total) by mouth 2 (two) times daily.   ezetimibe  (ZETIA ) 10 MG tablet TAKE 1 TABLET DAILY   gabapentin  (NEURONTIN ) 100 MG capsule TAKE 1 TO 2 CAPSULES THREE TIMES A DAY   isosorbide  mononitrate (IMDUR ) 30 MG 24 hr tablet TAKE 1 TABLET DAILY   JUBLIA 10 % SOLN SMARTSIG:Applicator Topical Daily   losartan  (COZAAR ) 100 MG tablet TAKE 1  TABLET DAILY (MUST KEEP UPCOMING APPOINTMENT IN JULY 2024 WITH DR WADDELL BEFORE ANYMORE REFILLS, FINAL ATTEMPT)   metFORMIN  (GLUCOPHAGE ) 850 MG tablet TAKE 1 TABLET TWICE A DAY WITH MEALS   nitroGLYCERIN  (NITROSTAT ) 0.4 MG SL tablet DISSOLVE 1 TABLET UNDER THE TONGUE EVERY 5 MINUTES AS NEEDED FOR CHEST PAIN (MAXIMUM 3 DOSES)   nystatin  (MYCOSTATIN /NYSTOP ) powder Apply 1 Application topically 2 (two) times daily. Use until resolved and then 2 extra days.   Omega-3 Fatty Acids (FISH OIL) 1000 MG CAPS Take 1,000 mg by mouth 2 (two) times daily. Wild English as a second language teacher   pantoprazole  (PROTONIX ) 40 MG tablet Take 1 tablet (40 mg total) by mouth daily.   spironolactone  (ALDACTONE ) 25 MG tablet Take 1 tablet (25 mg total) by mouth daily.   No facility-administered encounter medications on file as of 02/12/2024.    Allergies (verified) Fenofibrate , Fire ant, and Statins   History: Past Medical History:  Diagnosis Date   Anxiety    Back pain    Coronary artery disease 08/21/2013   a. 08/2013 s/p CABG x 6; b. 05/2022 PCI: LAD 158m, 60d, D2 50, LCX 80ost, 31m, 53m/d, OM1 100, RCA 100p, VG->OM2->OM3 nl, VG->D2 30ost, 99d @ Diag insertion (2.25x15 Onyx Frontier DES), VG->RCA nl, LIMA->LAD nl. EF 55-65%.   Diabetes mellitus    Diabetic neuropathy (HCC)    Diastolic dysfunction    a. 02/2017 Echo: EF 55-60%, no rwma, Gr1 DD, mildly dil Ao Root/RV. + PFO.   Diverticulosis    Diverticulosis    Embolic stroke (HCC)    a. 04/2017 MRI in setting of freq falls: small subacute infarction of bilat centrum semi-ovale consistent w/ embolic event-->Eliquis .   Erectile dysfunction    Family history of anesthesia complication    ' they cant wake my brother very easy   Fatty liver    Flank pain    GERD (gastroesophageal reflux disease)    History of bronchitis    History of chicken pox    HSV infection    ocular symptoms and oral lesions   Hypercholesterolemia    Hypertension    Kidney stones    Osteoarthritis     PAF (paroxysmal atrial fibrillation) (HCC)    a. Had post-op AF 08/2013; b. 09/2015 Holter: PAC's no AF; c. 05/2017 Noted to be in AFib-->Eliquis  (CHA2DS2VASc = 7); d. 08/2017 s/p DCCV-->ERAF-->Tikosyn  added; e. 08/2020 Zio: 4% Afib burden.   PFO (patent foramen ovale)    a. 02/2017 Echo: + PFO.   Sinus node dysfunction (HCC)    a. 08/2020 Zio: Predominantly sinus bradycardia at 53 (35-211).  20 SVT runs.  1 run of nonsustained VT x4 beats.  4% A-fib burden.  16 pauses lasting up to 3.6 seconds.  Intermittent junctional rhythm.  Patient declined pacemaker.   Past Surgical History:  Procedure Laterality Date   CARDIAC CATHETERIZATION  08/21/2013   DR VERLIN   CARDIOVERSION N/A 08/24/2017   Procedure: CARDIOVERSION;  Surgeon: Perla Evalene PARAS, MD;  Location: [facility] ORS;  Service: Cardiovascular;  Laterality: N/A;   CHOLECYSTECTOMY  1983   COLONOSCOPY WITH PROPOFOL  N/A 01/04/2023   Procedure: COLONOSCOPY WITH PROPOFOL ;  Surgeon: Therisa Bi, MD;  Location: [facility] ENDOSCOPY;  Service: Gastroenterology;  Laterality: N/A;   CORONARY ARTERY BYPASS GRAFT N/A 08/23/2013   Procedure: CORONARY ARTERY BYPASS GRAFTING (CABG);  Surgeon: Dallas KATHEE Jude, MD;  Location: [facility] OR;  Service: Open Heart Surgery;  Laterality: N/A;  CABG times six utilizing the left internal mammary artery and the right greater saphenous vein harvested endoscopically   CORONARY STENT INTERVENTION N/A 05/16/2022   Procedure: CORONARY STENT INTERVENTION;  Surgeon: Darron Deatrice LABOR, MD;  Location: [facility] INVASIVE CV LAB;  Service: Cardiovascular;  Laterality: N/A;   ENDOVEIN HARVEST OF GREATER SAPHENOUS VEIN Right 08/23/2013   Procedure: ENDOVEIN HARVEST OF GREATER SAPHENOUS VEIN;  Surgeon: Dallas KATHEE Jude, MD;  Location: [facility] OR;  Service: Open Heart Surgery;  Laterality: Right;   ESOPHAGOGASTRODUODENOSCOPY (EGD) WITH PROPOFOL  N/A 01/04/2023   Procedure: ESOPHAGOGASTRODUODENOSCOPY (EGD) WITH PROPOFOL ;  Surgeon: Therisa Bi, MD;  Location: [facility]  ENDOSCOPY;  Service: Gastroenterology;  Laterality: N/A;   HERNIA REPAIR     X3   INTRAOPERATIVE TRANSESOPHAGEAL ECHOCARDIOGRAM N/A 08/23/2013   Procedure: INTRAOPERATIVE TRANSESOPHAGEAL ECHOCARDIOGRAM;  Surgeon: Dallas KATHEE Jude, MD;  Location: [facility] OR;  Service: Open Heart Surgery;  Laterality: N/A;   LEFT HEART CATH AND CORS/GRAFTS ANGIOGRAPHY N/A 05/16/2022   Procedure: LEFT HEART CATH AND CORS/GRAFTS ANGIOGRAPHY;  Surgeon: Darron Deatrice LABOR, MD;  Location: [facility] INVASIVE CV LAB;  Service: Cardiovascular;  Laterality: N/A;   LEFT HEART CATHETERIZATION WITH CORONARY ANGIOGRAM N/A 08/21/2013   Procedure: LEFT HEART CATHETERIZATION WITH CORONARY ANGIOGRAM;  Surgeon: Lonni JONETTA Cash, MD;  Location: [facility] C[facility] LAB;  Service: Cardiovascular;  Laterality: N/A;   PACEMAKER IMPLANT N/A 07/03/2023   Procedure: PACEMAKER IMPLANT;  Surgeon: Waddell Danelle ORN, MD;  Location: [facility] INVASIVE CV LAB;  Service: Cardiovascular;  Laterality: N/A;   Family History  Problem Relation Age of Onset   Stroke Mother    Diabetes Mother    Heart disease Father        heart failure   Colon cancer Maternal Grandmother    Parkinsonism Maternal Grandfather    Stroke Paternal Grandfather    Prostate cancer Maternal Uncle    CAD Brother        CABG   Social History   Socioeconomic History   Marital status: Married    Spouse name: Not on file   Number of children: 2   Years of education: HS   Highest education level: Not on file  Occupational History   Occupation: Retired  Tobacco Use   Smoking status: Never   Smokeless tobacco: Never  Vaping Use   Vaping status: Never Used  Substance and Sexual Activity   Alcohol use: No    Alcohol/week: 0.0 standard drinks of alcohol   Drug use: No   Sexual activity: Not on file  Other Topics Concern   Not on file  Social History Narrative   Retired, remarried 1988   From [place], [place]   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   Right-handed.   2 cups caffeine daily   Social Drivers of Health   Financial Resource Strain: Low Risk  (02/12/2024)   Overall Financial Resource Strain (CARDIA)    Difficulty of Paying Living Expenses: Not hard at all  Food  Insecurity: No Food Insecurity (02/12/2024)   Hunger Vital Sign    Worried About Running Out of Food in the Last Year: Never true    Ran Out of Food in the Last Year: Never true  Transportation Needs: No Transportation Needs (02/12/2024)   PRAPARE - Administrator, Civil Service (Medical): No    Lack of Transportation (Non-Medical): No  Physical Activity: Sufficiently Active (02/12/2024)   Exercise Vital Sign    Days of Exercise per Week: 5 days    Minutes of Exercise per Session: 60 min  Stress: No Stress Concern Present (02/12/2024)   [employer] of Occupational Health - Occupational Stress Questionnaire    Feeling of Stress: Not at all  Social Connections: Socially Integrated (02/12/2024)   Social Connection and Isolation Panel    Frequency of Communication with Friends and Family: More than three times a week    Frequency of Social Gatherings with Friends and Family: Three times a week    Attends Religious Services: More than 4 times per year    Active Member of Clubs or Organizations: Yes    Attends Engineer, structural: More than 4 times per year    Marital Status: Married    Tobacco Counseling Counseling given: Not Answered    Clinical Intake:  Pre-visit preparation completed: Yes  Pain : 0-10 Pain Score: 5  Pain Type: Acute pain Pain Location: Back Pain Orientation: Lower Pain Descriptors / Indicators: Aching Pain Onset: Today Pain Frequency: Intermittent     BMI - recorded: 24.43 Nutritional Risks: None Diabetes: Yes CBG done?: No Did pt. bring in CBG monitor from home?: No  Lab Results  Component Value Date   HGBA1C 7.9 (H) 02/10/2023   HGBA1C 6.3 (H) 09/25/2022   HGBA1C 6.4 12/16/2021      What is the last grade level you completed in school?: HS graduate  Interpreter Needed?: No  Information entered by :: Andriea Hasegawa,CMA   Activities of Daily Living     02/12/2024   12:01 PM  In your present state of health, do you have any difficulty performing the following activities:  Hearing? 0  Vision? 0  Difficulty concentrating or making decisions? 0  Walking or climbing stairs? 0  Dressing or bathing? 0  Doing errands, shopping? 0  Preparing Food and eating ? N  Using the Toilet? N  In the past six months, have you accidently leaked urine? N  Do you have problems with loss of bowel control? N  Managing your Medications? N  Managing your Finances? N  Housekeeping or managing your Housekeeping? N    Patient Care Team: Cleatus Arlyss RAMAN, MD as PCP - General (Family Medicine) Perla Evalene PARAS, MD as PCP - Cardiology (Cardiology) Verlin Lonni BIRCH, MD as Consulting Physician (Cardiology) Jonda Curtistine Mans, MD as Consulting Physician (Ophthalmology) Court Pulling, MD as Consulting Physician (Dermatology) Perla Evalene PARAS, MD as Consulting Physician (Cardiology) Fate Morna SAILOR, [facility] (Inactive) as Pharmacist (Pharmacist) Wertman, Sara E, PA-C (Neurology)  I have updated your Care Teams any recent Medical Services you may have received from other providers in the past year.     Assessment:   This is a routine wellness examination for [name] [name].  Hearing/Vision screen Hearing Screening - Comments:: Patient wears hearing aids  Vision Screening - Comments:: Patient wears glasses    Goals Addressed             This Visit's Progress    Patient Stated  Patient wants to die Friedly        Depression Screen     02/12/2024   12:05 PM 04/03/2023    9:11 AM 02/17/2023    3:53 PM 02/07/2023    9:06 AM 02/06/2023    2:45 PM 01/31/2023    9:19 AM 10/18/2022    9:14 AM  PHQ 2/9 Scores  PHQ - 2 Score 0 0 0 0 0 0 0  PHQ- 9 Score 0 1 3 3  3  0     Fall Risk     02/12/2024   12:02 PM 04/03/2023    9:10 AM 02/17/2023    3:53 PM 02/07/2023    9:12 AM 01/31/2023    9:19 AM  Fall Risk   Falls in the past year? 1 0 0 0 1  Number falls in past yr: 0 0 0 0 0  Injury with Fall? 0 0 0 0 1  Risk for fall due to : History of fall(s) No Fall Risks No Fall Risks No Fall Risks No Fall Risks  Follow up Falls evaluation completed Falls evaluation completed Falls evaluation completed Falls prevention discussed;Education provided;Falls evaluation completed Falls evaluation completed    MEDICARE RISK AT HOME:  Medicare Risk at Home Any stairs in or around the home?: Yes If so, are there any without handrails?: No Home free of loose throw rugs in walkways, pet beds, electrical cords, etc?: Yes Adequate lighting in your home to reduce risk of falls?: Yes Life alert?: No Use of a cane, walker or w/c?: No Grab bars in the bathroom?: Yes Shower chair or bench in shower?: Yes Elevated toilet seat or a handicapped toilet?: Yes  TIMED UP AND GO:  Was the test performed?  No  Cognitive Function: 6CIT completed    08/16/2017    2:30 PM 07/26/2016    8:25 AM  MMSE - Mini Mental State Exam  Orientation to time 5  5   Orientation to Place 5  5   Registration 3  3   Attention/ Calculation 0  0   Recall 3  3   Language- name 2 objects 0  0   Language- repeat 1 1  Language- follow 3 step command 3  3   Language- read & follow direction 0  0   Write a sentence 0  0   Copy design 0  0   Total score 20  20      Data saved with a previous flowsheet row definition        02/12/2024   11:58 AM 02/07/2023    9:12 AM  6CIT Screen  What Year? 0 points 0 points  What month? 0 points 0 points  What time? 0 points 0 points  Count back from 20 0 points 0 points  Months in reverse 0 points 2 points  Repeat phrase 0 points 4 points  Total Score 0 points 6 points    Immunizations Immunization History  Administered Date(s) Administered   Fluad  Quad(high Dose 65+) 05/21/2020   Fluad Trivalent(High Dose 65+) 04/03/2023   Influenza Split 06/11/2012, 06/08/2014   Influenza,inj,Quad PF,6+ Mos 07/24/2015, 05/31/2018   Influenza-Unspecified 05/08/2016, 05/19/2017, 05/03/2021   Moderna Covid-19 Vaccine Bivalent Booster 50yrs & up 05/18/2021   Moderna Sars-Covid-2 Vaccination 08/19/2019, 09/17/2019, 06/05/2020, 11/24/2020   Pfizer Covid-19 Vaccine Bivalent Booster 54yrs & up 03/16/2022   Pfizer(Comirnaty)Fall Seasonal Vaccine 12 years and older 06/01/2022   Pneumococcal Conjugate-13 07/14/2014   Pneumococcal Polysaccharide-23 06/11/2012  Td 08/08/2008   Tdap 10/26/2018   Zoster Recombinant(Shingrix) 01/01/2022, 03/16/2022    Screening Tests Health Maintenance  Topic Date Due   Diabetic kidney evaluation - Urine ACR  Never done   FOOT EXAM  11/04/2020   COVID-19 Vaccine (8 - 2024-25 season) 04/09/2023   HEMOGLOBIN A1C  08/13/2023   Medicare Annual Wellness (AWV)  02/07/2024   INFLUENZA VACCINE  03/08/2024   OPHTHALMOLOGY EXAM  04/11/2024   Diabetic kidney evaluation - eGFR measurement  12/18/2024   DTaP/Tdap/Td (3 - Td or Tdap) 10/25/2028   Pneumococcal Vaccine: 50+ Years  Completed   Zoster Vaccines- Shingrix  Completed   Hepatitis B Vaccines  Aged Out   HPV VACCINES  Aged Out   Meningococcal B Vaccine  Aged Out    Health Maintenance  Health Maintenance Due  Topic Date Due   Diabetic kidney evaluation - Urine ACR  Never done   FOOT EXAM  11/04/2020   COVID-19 Vaccine (8 - 2024-25 season) 04/09/2023   HEMOGLOBIN A1C  08/13/2023   Medicare Annual Wellness (AWV)  02/07/2024   Health Maintenance Items Addressed:declined health maintenance   Additional Screening:  Vision Screening: Recommended annual ophthalmology exams for early detection of glaucoma and other disorders of the eye. Would you like a referral to an eye doctor? No    Dental Screening: Recommended annual dental exams for proper oral hygiene  Community  Resource Referral / Chronic Care Management: CRR required this visit?  No   CCM required this visit?  No   Plan:    I have personally reviewed and noted the following in the patient's chart:   Medical and social history Use of alcohol, tobacco or illicit drugs  Current medications and supplements including opioid prescriptions. Patient is not currently taking opioid prescriptions. Functional ability and status Nutritional status Physical activity Advanced directives List of other physicians Hospitalizations, surgeries, and ER visits in previous 12 months Vitals Screenings to include cognitive, depression, and falls Referrals and appointments  In addition, I have reviewed and discussed with patient certain preventive protocols, quality metrics, and best practice recommendations. A written personalized care plan for preventive services as well as general preventive health recommendations were provided to patient.   [name] [name] Right, [place]   02/12/2024   After Visit Summary: (MyChart) Due to this being a telephonic visit, the after visit summary with patients personalized plan was offered to patient via MyChart   Notes: Nothing significant to report at this time.

## 2024-02-21 DIAGNOSIS — H401131 Primary open-angle glaucoma, bilateral, mild stage: Secondary | ICD-10-CM | POA: Diagnosis not present

## 2024-02-22 NOTE — Addendum Note (Signed)
 Addended by: TAWNI DRILLING D on: 02/22/2024 04:12 PM   Modules accepted: Orders

## 2024-02-22 NOTE — Progress Notes (Signed)
 Remote pacemaker transmission.

## 2024-02-26 ENCOUNTER — Ambulatory Visit (HOSPITAL_BASED_OUTPATIENT_CLINIC_OR_DEPARTMENT_OTHER): Attending: Cardiovascular Disease | Admitting: Cardiology

## 2024-02-26 DIAGNOSIS — G4733 Obstructive sleep apnea (adult) (pediatric): Secondary | ICD-10-CM | POA: Diagnosis not present

## 2024-02-26 DIAGNOSIS — G4736 Sleep related hypoventilation in conditions classified elsewhere: Secondary | ICD-10-CM | POA: Insufficient documentation

## 2024-02-27 NOTE — Procedures (Signed)
 [facility] [address] [place], [place] [address] Tel: [phone]   Fax: [phone]  Titration Interpretation  Patient Name:  [name] [name], [name] [name] Date:  02/26/2024 Referring Physician:  DEBBY SOR [phone]) %% Indications for Polysomnography The patient is an 83 year old Male who is 5' 7 and weighs 153.0 lbs. His BMI equals 24.0.  A full night titration treatment study was performed.  Medications taken while at sleep lab:  No medications taken.   Polysomnogram Data A full night polysomnogram recorded the standard physiologic parameters including EEG, EOG, EMG, EKG, nasal and oral airflow.  Respiratory parameters of chest and abdominal movements were recorded with Respiratory Inductance Plethysmography belts.  Oxygen saturation was recorded by pulse oximetry.   Sleep Architecture The total recording time of the polysomnogram was 413.1 minutes.  The total sleep time was 193.0 minutes.  The patient spent 10.6% of total sleep time in Stage N1, 48.4% in Stage N2, 22.3% in Stages N3, and 18.7% in REM.  Sleep latency was 61.7 minutes.  REM latency was 236.5 minutes.  Sleep Efficiency was 46.7%.  Wake after Sleep Onset time was 158.0 minutes.  Titration Summary The patient was titrated at pressures ranging from 12 cm/H20 up to 22/18 cm/H20.  The last pressure used in the study was 19/15 cm/H20.  Respiratory Events The polysomnogram revealed a presence of 2 obstructive, 16 central, and 57 mixed apneas resulting in an Apnea index of 23.3 events per hour.  There were 7 hypopneas (>=3% desaturation and/or arousal) resulting in an Apnea\Hypopnea Index (AHI >=3% desaturation and/or arousal) of 25.5 events per hour.  There were 1 hypopnea (>=4% desaturation) resulting in an Apnea\Hypopnea Index (AHI >=4% desaturation) of 23.6 events per hour.  There were 22 Respiratory Effort Related Arousals resulting in a RERA index of 6.8 events per hour. The Respiratory  Disturbance Index is 32.3 events per hour.  The snore index was 0 events per hour.  Mean oxygen saturation was 93.8%.  The lowest oxygen saturation during sleep was 84.0%.  Time spent <=88% oxygen saturation was 6.4 minutes (1.6%).  Limb Activity There were 0 limb movements recorded.    Cardiac Summary The average pulse rate was 60.6 bpm.  The minimum pulse rate was 59.0 bpm while the maximum pulse rate was 83.0 bpm.  Cardiac rhythm was normal sinus rhythm with frequent PVCs  Diagnosis:  Obstructive sleep apnea Nocturnal hypoxemia Unsuccessful CPAP titration due to ongoing respiratory events and patient intolerance to higher pressure Successful BiPAP titration      Recommendations: Recommend a trial of ResMed BiPAP 18/14 cm H2O with heated humidity and medium Fisher and Paykel Simplus full facemask.   Close follow-up is necessary to ensure success with CPAP or oral appliance therapy for maximum benefit. A follow-up oximetry study on CPAP is recommended to assess the adequacy of therapy and determine the need for supplemental oxygen or the potential need for Bi-level therapy.  An arterial blood gas to determine the adequacy of baseline ventilation and oxygenation should also be considered. Healthy sleep recommendations include:  adequate nightly sleep (normal 7-9 hrs/night), avoidance of caffeine after noon and alcohol near bedtime, and maintaining a sleep environment that is cool, dark and quiet. Weight loss for overweight patients is recommended.  Even modest amounts of weight loss can significantly improve the severity of sleep apnea. Snoring recommendations include:  weight loss where appropriate, side sleeping, and avoidance of alcohol before bed Operation of motor vehicle should be avoided when sleepy.  Followup in Sleep Medicine Clinic in 6 weeks.   This study was personally reviewed and electronically signed by: SHLOMO WILBERT SAUNDERS, MD Accredited Board Certified in Sleep  Medicine Date/Time: 02/27/2024 1:54PM

## 2024-02-28 ENCOUNTER — Telehealth: Payer: Self-pay | Admitting: *Deleted

## 2024-02-28 DIAGNOSIS — I25709 Atherosclerosis of coronary artery bypass graft(s), unspecified, with unspecified angina pectoris: Secondary | ICD-10-CM

## 2024-02-28 DIAGNOSIS — I10 Essential (primary) hypertension: Secondary | ICD-10-CM

## 2024-02-28 DIAGNOSIS — I25810 Atherosclerosis of coronary artery bypass graft(s) without angina pectoris: Secondary | ICD-10-CM

## 2024-02-28 DIAGNOSIS — R0683 Snoring: Secondary | ICD-10-CM

## 2024-02-28 DIAGNOSIS — G4733 Obstructive sleep apnea (adult) (pediatric): Secondary | ICD-10-CM

## 2024-02-28 NOTE — Telephone Encounter (Signed)
-----   Message from Wilbert Bihari sent at 02/27/2024  1:56 PM EDT ----- Unsuccessful CPAP titration due to ongoing events but successful BiPAP titration.  Please get an ONO on BiPAP

## 2024-02-28 NOTE — Telephone Encounter (Signed)
 [name] Wilbert SAUNDERS, MD  P Cv Div Sleep Studies Successful BiPAP titration.  Please get an ONO on BiPAP

## 2024-03-05 ENCOUNTER — Encounter: Payer: Self-pay | Admitting: Internal Medicine

## 2024-03-05 ENCOUNTER — Ambulatory Visit: Attending: Internal Medicine | Admitting: Internal Medicine

## 2024-03-05 VITALS — BP 118/60 | HR 64 | Ht 66.0 in | Wt 156.3 lb

## 2024-03-05 DIAGNOSIS — I4819 Other persistent atrial fibrillation: Secondary | ICD-10-CM

## 2024-03-05 LAB — CUP PACEART INCLINIC DEVICE CHECK
Battery Remaining Longevity: 100 mo
Battery Voltage: 2.99 V
Brady Statistic RA Percent Paced: 49 %
Brady Statistic RV Percent Paced: 39 %
Date Time Interrogation Session: 20250729123159
Implantable Lead Connection Status: 753985
Implantable Lead Connection Status: 753985
Implantable Lead Implant Date: 20241125
Implantable Lead Implant Date: 20241125
Implantable Lead Location: 753859
Implantable Lead Location: 753860
Implantable Pulse Generator Implant Date: 20241125
Lead Channel Impedance Value: 412.5 Ohm
Lead Channel Impedance Value: 425 Ohm
Lead Channel Pacing Threshold Amplitude: 0.5 V
Lead Channel Pacing Threshold Amplitude: 0.5 V
Lead Channel Pacing Threshold Amplitude: 1.5 V
Lead Channel Pacing Threshold Amplitude: 1.5 V
Lead Channel Pacing Threshold Pulse Width: 0.5 ms
Lead Channel Pacing Threshold Pulse Width: 0.5 ms
Lead Channel Pacing Threshold Pulse Width: 0.5 ms
Lead Channel Pacing Threshold Pulse Width: 0.5 ms
Lead Channel Sensing Intrinsic Amplitude: 12 mV
Lead Channel Sensing Intrinsic Amplitude: 2.6 mV
Lead Channel Setting Pacing Amplitude: 2.5 V
Lead Channel Setting Pacing Amplitude: 2.5 V
Lead Channel Setting Pacing Pulse Width: 0.5 ms
Lead Channel Setting Sensing Sensitivity: 2 mV
Pulse Gen Model: 2272
Pulse Gen Serial Number: 8231918

## 2024-03-05 NOTE — Patient Instructions (Signed)

## 2024-03-05 NOTE — Progress Notes (Signed)
 HPI [name] [name] returns for followup of PAF and sinus node dysfunction. He is a pelasant 83 yo man with documenet pauses who underwent DDD PM insertion 10 months ago. He has done well in the interim with no chest pain. He does not feel his afib nearly as much since his PPM was placed. He denies syncope. 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 He has not had syncope or near syncope. 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 Allergies  Allergen Reactions   Fenofibrate  Other (See Comments)    Muscle weakness/pain   Fire Ant     Allergic reaction   Statins Other (See Comments)    MYALGIA, muscle pain     Current Outpatient Medications  Medication Sig Dispense Refill   acyclovir  (ZOVIRAX ) 800 MG tablet TAKE 1 TABLET TWICE A DAY 180 tablet 3   amLODipine  (NORVASC ) 10 MG tablet Take 1 tablet (10 mg total) by mouth daily. 90 tablet 3   apixaban  (ELIQUIS ) 5 MG TABS tablet TAKE 1 TABLET TWICE A DAY 180 tablet 1   carboxymethylcellulose (REFRESH TEARS) 0.5 % SOLN Place 1 drop into both eyes 2 (two) times daily.     COD LIVER OIL PO Take 10 mcg by mouth 2 (two) times daily.     dofetilide  (TIKOSYN ) 500 MCG capsule Take 1 capsule (500 mcg total) by mouth 2 (two) times daily. 180 capsule 3   ezetimibe  (ZETIA ) 10 MG tablet TAKE 1 TABLET DAILY 90 tablet 3   gabapentin  (NEURONTIN ) 100 MG capsule TAKE 1 TO 2 CAPSULES THREE TIMES A DAY 100 capsule 2   isosorbide  mononitrate (IMDUR ) 30 MG 24 hr tablet TAKE 1 TABLET DAILY 90 tablet 3   JUBLIA 10 % SOLN SMARTSIG:Applicator Topical Daily     losartan  (COZAAR ) 100 MG tablet TAKE 1 TABLET DAILY (MUST KEEP UPCOMING APPOINTMENT IN JULY 2024 WITH DR WADDELL BEFORE ANYMORE REFILLS, FINAL ATTEMPT) 90 tablet 3   metFORMIN  (GLUCOPHAGE ) 850 MG tablet TAKE 1 TABLET TWICE A DAY WITH MEALS 180 tablet 3   nitroGLYCERIN  (NITROSTAT ) 0.4 MG SL tablet DISSOLVE 1 TABLET UNDER THE TONGUE EVERY 5 MINUTES AS NEEDED FOR CHEST PAIN (MAXIMUM 3 DOSES) 25 tablet 44   nystatin   (MYCOSTATIN /NYSTOP ) powder Apply 1 Application topically 2 (two) times daily. Use until resolved and then 2 extra days. 60 g 1   Omega-3 Fatty Acids (FISH OIL) 1000 MG CAPS Take 1,000 mg by mouth 2 (two) times daily. Wild English as a second language teacher     pantoprazole  (PROTONIX ) 40 MG tablet Take 1 tablet (40 mg total) by mouth daily. 90 tablet 3   spironolactone  (ALDACTONE ) 25 MG tablet Take 1 tablet (25 mg total) by mouth daily. 90 tablet 3   No current facility-administered medications for this visit.     Past Medical History:  Diagnosis Date   Anxiety    Back pain    Coronary artery disease 08/21/2013   a. 08/2013 s/p CABG x 6; b. 05/2022 PCI: LAD 173m, 60d, D2 50, LCX 80ost, 66m, 54m/d, OM1 100, RCA 100p, VG->OM2->OM3 nl, VG->D2 30ost, 99d @ Diag insertion (2.25x15 Onyx Frontier DES), VG->RCA nl, LIMA->LAD nl. EF 55-65%.   Diabetes mellitus    Diabetic neuropathy (HCC)    Diastolic dysfunction    a. 02/2017 Echo: EF 55-60%, no rwma, Gr1 DD, mildly dil Ao Root/RV. + PFO.   Diverticulosis    Diverticulosis    Embolic stroke (HCC)    a. 04/2017 MRI in setting of freq falls: small  subacute infarction of bilat centrum semi-ovale consistent w/ embolic event-->Eliquis .   Erectile dysfunction    Family history of anesthesia complication    ' they cant wake my brother very easy   Fatty liver    Flank pain    GERD (gastroesophageal reflux disease)    History of bronchitis    History of chicken pox    HSV infection    ocular symptoms and oral lesions   Hypercholesterolemia    Hypertension    Kidney stones    Osteoarthritis    PAF (paroxysmal atrial fibrillation) (HCC)    a. Had post-op AF 08/2013; b. 09/2015 Holter: PAC's no AF; c. 05/2017 Noted to be in AFib-->Eliquis  (CHA2DS2VASc = 7); d. 08/2017 s/p DCCV-->ERAF-->Tikosyn  added; e. 08/2020 Zio: 4% Afib burden.   PFO (patent foramen ovale)    a. 02/2017 Echo: + PFO.   Sinus node dysfunction (HCC)    a. 08/2020 Zio: Predominantly sinus bradycardia at 53  (35-211).  20 SVT runs.  1 run of nonsustained VT x4 beats.  4% A-fib burden.  16 pauses lasting up to 3.6 seconds.  Intermittent junctional rhythm.  Patient declined pacemaker.    ROS:   All systems reviewed and negative except as noted in the HPI.   Past Surgical History:  Procedure Laterality Date   CARDIAC CATHETERIZATION  08/21/2013   DR VERLIN   CARDIOVERSION N/A 08/24/2017   Procedure: CARDIOVERSION;  Surgeon: Perla Evalene PARAS, MD;  Location: [facility] ORS;  Service: Cardiovascular;  Laterality: N/A;   CHOLECYSTECTOMY  1983   COLONOSCOPY WITH PROPOFOL  N/A 01/04/2023   Procedure: COLONOSCOPY WITH PROPOFOL ;  Surgeon: Therisa Bi, MD;  Location: [facility] ENDOSCOPY;  Service: Gastroenterology;  Laterality: N/A;   CORONARY ARTERY BYPASS GRAFT N/A 08/23/2013   Procedure: CORONARY ARTERY BYPASS GRAFTING (CABG);  Surgeon: Dallas KATHEE Jude, MD;  Location: [facility] OR;  Service: Open Heart Surgery;  Laterality: N/A;  CABG times six utilizing the left internal mammary artery and the right greater saphenous vein harvested endoscopically   CORONARY STENT INTERVENTION N/A 05/16/2022   Procedure: CORONARY STENT INTERVENTION;  Surgeon: Darron Deatrice LABOR, MD;  Location: [facility] INVASIVE CV LAB;  Service: Cardiovascular;  Laterality: N/A;   ENDOVEIN HARVEST OF GREATER SAPHENOUS VEIN Right 08/23/2013   Procedure: ENDOVEIN HARVEST OF GREATER SAPHENOUS VEIN;  Surgeon: Dallas KATHEE Jude, MD;  Location: [facility] OR;  Service: Open Heart Surgery;  Laterality: Right;   ESOPHAGOGASTRODUODENOSCOPY (EGD) WITH PROPOFOL  N/A 01/04/2023   Procedure: ESOPHAGOGASTRODUODENOSCOPY (EGD) WITH PROPOFOL ;  Surgeon: Therisa Bi, MD;  Location: [facility] ENDOSCOPY;  Service: Gastroenterology;  Laterality: N/A;   HERNIA REPAIR     X3   INTRAOPERATIVE TRANSESOPHAGEAL ECHOCARDIOGRAM N/A 08/23/2013   Procedure: INTRAOPERATIVE TRANSESOPHAGEAL ECHOCARDIOGRAM;  Surgeon: Dallas KATHEE Jude, MD;  Location[facility] OR;  Service: Open Heart Surgery;  Laterality: N/A;    LEFT HEART CATH AND CORS/GRAFTS ANGIOGRAPHY N/A 05/16/2022   Procedure: LEFT HEART CATH AND CORS/GRAFTS ANGIOGRAPHY;  Surgeon: Darron Deatrice LABOR, MD;  Location: [facility] INVASIVE CV LAB;  Service: Cardiovascular;  Laterality: N/A;   LEFT HEART CATHETERIZATION WITH CORONARY ANGIOGRAM N/A 08/21/2013   Procedure: LEFT HEART CATHETERIZATION WITH CORONARY ANGIOGRAM;  Surgeon: Lonni JONETTA VERLIN, MD;  Location: [facility] CATH LAB;  Service: Cardiovascular;  Laterality: N/A;   PACEMAKER IMPLANT N/A 07/03/2023   Procedure: PACEMAKER IMPLANT;  Surgeon: Waddell Danelle ORN, MD;  Location: [facility] INVASIVE CV LAB;  Service: Cardiovascular;  Laterality: N/A;     Family History  Problem Relation Age of Onset  Stroke Mother    Diabetes Mother    Heart disease Father        heart failure   Colon cancer Maternal Grandmother    Parkinsonism Maternal Grandfather    Stroke Paternal Grandfather    Prostate cancer Maternal Uncle    CAD Brother        CABG     Social History   Socioeconomic History   Marital status: Married    Spouse name: Not on file   Number of children: 2   Years of education: HS   Highest education level: Not on file  Occupational History   Occupation: Retired  Tobacco Use   Smoking status: Never   Smokeless tobacco: Never  Vaping Use   Vaping status: Never Used  Substance and Sexual Activity   Alcohol use: No    Alcohol/week: 0.0 standard drinks of alcohol   Drug use: No   Sexual activity: Not on file  Other Topics Concern   Not on file  Social History Narrative   Retired, remarried 1988   From [place], [place]   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   Right-handed.   2 cups caffeine daily   Social Drivers of Health   Financial Resource Strain: Low Risk  (02/12/2024)   Overall Financial Resource Strain (CARDIA)    Difficulty of Paying Living Expenses: Not hard at all  Food Insecurity: No Food Insecurity (02/12/2024)   Hunger  Vital Sign    Worried About Running Out of Food in the Last Year: Never true    Ran Out of Food in the Last Year: Never true  Transportation Needs: No Transportation Needs (02/12/2024)   PRAPARE - Administrator, Civil Service (Medical): No    Lack of Transportation (Non-Medical): No  Physical Activity: Sufficiently Active (02/12/2024)   Exercise Vital Sign    Days of Exercise per Week: 5 days    Minutes of Exercise per Session: 60 min  Stress: No Stress Concern Present (02/12/2024)   [employer] of Occupational Health - Occupational Stress Questionnaire    Feeling of Stress: Not at all  Social Connections: Socially Integrated (02/12/2024)   Social Connection and Isolation Panel    Frequency of Communication with Friends and Family: More than three times a week    Frequency of Social Gatherings with Friends and Family: Three times a week    Attends Religious Services: More than 4 times per year    Active Member of Clubs or Organizations: Yes    Attends Banker Meetings: More than 4 times per year    Marital Status: Married  Catering manager Violence: Not At Risk (02/12/2024)   Humiliation, Afraid, Rape, and Kick questionnaire    Fear of Current or Ex-Partner: No    Emotionally Abused: No    Physically Abused: No    Sexually Abused: No     BP 118/60   Pulse 64   Ht 5' 6 (1.676 m)   Wt 156 lb 4.8 oz (70.9 kg)   SpO2 96%   BMI 25.23 kg/m   Physical Exam:  Well appearing NAD HEENT: Unremarkable Neck:  No JVD, no thyromegally Lymphatics:  No adenopathy Back:  No CVA tenderness Lungs:  Clear HEART:  Regular rate rhythm, no murmurs, no rubs, no clicks Abd:  soft, positive bowel sounds, no organomegally, no rebound, no guarding Ext:  2 plus pulses, no edema, no cyanosis, no clubbing Skin:  No rashes no nodules Neuro:  CN II through XII intact, motor grossly intact  EKG -- afib with ventricular pacing  DEVICE  Normal device function.  See PaceArt  for details.   Assess/Plan:  Persistent atrial fib - he is out of rhythm today and did not know it. He will continue dofetilide  as he has been in rhythm 1/2 of the time and if that worsens we might consider stopping this medication. Heart block/sinus node dysfunction - he is asymptomatic s/p PPM insertion. PPM - he is doing well and his device is working normally. Coags - he has not had any bleeding on eliquis . We will follow.   Danelle Carlin Attridge,MD

## 2024-03-08 DIAGNOSIS — E119 Type 2 diabetes mellitus without complications: Secondary | ICD-10-CM | POA: Diagnosis not present

## 2024-03-20 NOTE — Telephone Encounter (Signed)
 Pt called in for sleep study results and would like to get BiPAP machine ordered

## 2024-03-24 ENCOUNTER — Other Ambulatory Visit: Payer: Self-pay | Admitting: Family Medicine

## 2024-03-24 DIAGNOSIS — E1149 Type 2 diabetes mellitus with other diabetic neurological complication: Secondary | ICD-10-CM

## 2024-03-25 ENCOUNTER — Ambulatory Visit: Payer: Self-pay | Admitting: Family Medicine

## 2024-03-25 ENCOUNTER — Other Ambulatory Visit (INDEPENDENT_AMBULATORY_CARE_PROVIDER_SITE_OTHER)

## 2024-03-25 DIAGNOSIS — E1149 Type 2 diabetes mellitus with other diabetic neurological complication: Secondary | ICD-10-CM

## 2024-03-25 LAB — CBC WITH DIFFERENTIAL/PLATELET
Basophils Absolute: 0 K/uL (ref 0.0–0.1)
Basophils Relative: 0.3 % (ref 0.0–3.0)
Eosinophils Absolute: 0.1 K/uL (ref 0.0–0.7)
Eosinophils Relative: 1.3 % (ref 0.0–5.0)
HCT: 45.2 % (ref 39.0–52.0)
Hemoglobin: 14.9 g/dL (ref 13.0–17.0)
Lymphocytes Relative: 33.5 % (ref 12.0–46.0)
Lymphs Abs: 2.3 K/uL (ref 0.7–4.0)
MCHC: 32.9 g/dL (ref 30.0–36.0)
MCV: 98.7 fl (ref 78.0–100.0)
Monocytes Absolute: 0.5 K/uL (ref 0.1–1.0)
Monocytes Relative: 7.4 % (ref 3.0–12.0)
Neutro Abs: 4 K/uL (ref 1.4–7.7)
Neutrophils Relative %: 57.5 % (ref 43.0–77.0)
Platelets: 201 K/uL (ref 150.0–400.0)
RBC: 4.58 Mil/uL (ref 4.22–5.81)
RDW: 13 % (ref 11.5–15.5)
WBC: 7 K/uL (ref 4.0–10.5)

## 2024-03-25 LAB — COMPREHENSIVE METABOLIC PANEL WITH GFR
ALT: 12 U/L (ref 0–53)
AST: 17 U/L (ref 0–37)
Albumin: 4.7 g/dL (ref 3.5–5.2)
Alkaline Phosphatase: 73 U/L (ref 39–117)
BUN: 18 mg/dL (ref 6–23)
CO2: 28 meq/L (ref 19–32)
Calcium: 10.1 mg/dL (ref 8.4–10.5)
Chloride: 102 meq/L (ref 96–112)
Creatinine, Ser: 1.03 mg/dL (ref 0.40–1.50)
GFR: 67.06 mL/min (ref >60.00)
Glucose, Bld: 136 mg/dL — ABNORMAL HIGH (ref 70–99)
Potassium: 5.3 meq/L — ABNORMAL HIGH (ref 3.5–5.1)
Sodium: 139 meq/L (ref 135–145)
Total Bilirubin: 1.1 mg/dL (ref 0.2–1.2)
Total Protein: 6.9 g/dL (ref 6.0–8.3)

## 2024-03-25 LAB — HEMOGLOBIN A1C: Hgb A1c MFr Bld: 6.6 % — ABNORMAL HIGH (ref 4.6–6.5)

## 2024-03-25 LAB — LIPID PANEL
Cholesterol: 126 mg/dL (ref 0–200)
HDL: 33.5 mg/dL — ABNORMAL LOW (ref >39.00)
LDL Cholesterol: 40 mg/dL (ref 0–99)
NonHDL: 92.38
Total CHOL/HDL Ratio: 4
Triglycerides: 261 mg/dL — ABNORMAL HIGH (ref 0.0–149.0)
VLDL: 52.2 mg/dL — ABNORMAL HIGH (ref 0.0–40.0)

## 2024-03-25 LAB — MICROALBUMIN / CREATININE URINE RATIO
Creatinine,U: 181.1 mg/dL
Microalb Creat Ratio: 14.6 mg/g (ref 0.0–30.0)
Microalb, Ur: 2.6 mg/dL — ABNORMAL HIGH (ref 0.0–1.9)

## 2024-03-25 LAB — TSH: TSH: 2.93 u[IU]/mL (ref 0.35–5.50)

## 2024-03-26 ENCOUNTER — Other Ambulatory Visit: Payer: Self-pay | Admitting: Family Medicine

## 2024-04-02 ENCOUNTER — Encounter: Payer: Self-pay | Admitting: Family Medicine

## 2024-04-02 ENCOUNTER — Ambulatory Visit (INDEPENDENT_AMBULATORY_CARE_PROVIDER_SITE_OTHER): Payer: Medicare Other

## 2024-04-02 ENCOUNTER — Ambulatory Visit (INDEPENDENT_AMBULATORY_CARE_PROVIDER_SITE_OTHER): Admitting: Family Medicine

## 2024-04-02 VITALS — BP 106/62 | HR 61 | Temp 97.7°F | Ht 66.85 in | Wt 155.6 lb

## 2024-04-02 DIAGNOSIS — E785 Hyperlipidemia, unspecified: Secondary | ICD-10-CM

## 2024-04-02 DIAGNOSIS — G720 Drug-induced myopathy: Secondary | ICD-10-CM

## 2024-04-02 DIAGNOSIS — Z0000 Encounter for general adult medical examination without abnormal findings: Secondary | ICD-10-CM | POA: Diagnosis not present

## 2024-04-02 DIAGNOSIS — R413 Other amnesia: Secondary | ICD-10-CM

## 2024-04-02 DIAGNOSIS — Z7984 Long term (current) use of oral hypoglycemic drugs: Secondary | ICD-10-CM | POA: Diagnosis not present

## 2024-04-02 DIAGNOSIS — I495 Sick sinus syndrome: Secondary | ICD-10-CM

## 2024-04-02 DIAGNOSIS — E1149 Type 2 diabetes mellitus with other diabetic neurological complication: Secondary | ICD-10-CM | POA: Diagnosis not present

## 2024-04-02 DIAGNOSIS — I10 Essential (primary) hypertension: Secondary | ICD-10-CM | POA: Diagnosis not present

## 2024-04-02 DIAGNOSIS — Z7189 Other specified counseling: Secondary | ICD-10-CM

## 2024-04-02 MED ORDER — AMLODIPINE BESYLATE 10 MG PO TABS: 5.0000 mg | ORAL_TABLET | Freq: Every day | ORAL | Status: AC

## 2024-04-02 MED ORDER — METFORMIN HCL 850 MG PO TABS: 850.0000 mg | ORAL_TABLET | Freq: Every day | ORAL | Status: DC

## 2024-04-02 NOTE — Progress Notes (Unsigned)
 Diabetes:  Using medications without difficulties:some occ diarrhea.   Hypoglycemic episodes: no Hyperglycemic episodes: no Feet problems: some foot pain at baseline.  Blood Sugars averaging: not checked often.   eye exam within last year:yes Labs d/w pt.    Hypertension:    Using medication without problems or lightheadedness: he can occ get lightheaded.   Chest pain with exertion:no Edema:no Short of breath:no but less exercise tolerance compared to prev.  No falls.    He doesn't tolerate gabapentin  100mg  as well as prev, he can feel lightheaded with use but it helps with foot pain. He takes a few doses per week.  See notes on labs.  Elevated Cholesterol: Using medications without problems:yes Muscle aches: no Diet compliance: d/w pt.  Exercise: d/w pt.   Flu d/w pt.  To be done this fall.   [facility]hingles [facility] patient report.   PNA UTD Tetanus 2020 covid vaccine 2021 Colonoscopy 2018 PSA deferred  Advance directive- wife and daughter equally designated if patient were incapacitated.    Memory changes noted in the last year.  He is having some lapses with leaving items at the grocery store.  Not getting lost but he has to be careful with less automaticity with driving.  Not leaving the stove on.  He doesn't balance his checkbook but hasn't in years.    Would defer DXA until we can address memory changes.    MMSE 28/30.    PMH and SH reviewed.   Vital signs, Meds and allergies reviewed.  ROS: Per HPI unless specifically indicated in ROS section   GEN: nad, alert and oriented except as below. HEENT: mucous membranes moist NECK: supple w/o LA CV: rrr PULM: ctab, no inc wob ABD: soft, +bs EXT: no edema SKIN: no acute rash  Diabetic foot exam: Normal inspection No skin breakdown No calluses  Normal DP pulses Normal sensation to light tough and monofilament Nails thickened.   MMSE 28 out of 30.  He was 1 day off on the day of the week and the day of  the month.  Otherwise normal testing.

## 2024-04-02 NOTE — Patient Instructions (Addendum)
 Go to the lab on the way out.   If you have mychart we'll likely use that to update you.    Cut the metformin  back to 1 tab a day.  Recheck in about 3 months.  A1c at the visit.  Take care.  Glad to see you.  Let me see about options instead of gabapentin .   I would try cutting amlodipine  in half and see if you feel better/less lightheaded.  Update me as needed.

## 2024-04-03 ENCOUNTER — Ambulatory Visit: Payer: Self-pay | Admitting: Family Medicine

## 2024-04-03 ENCOUNTER — Telehealth: Payer: Self-pay | Admitting: Cardiology

## 2024-04-03 LAB — VITAMIN B12: Vitamin B-12: >1500 pg/mL — ABNORMAL HIGH (ref 211–911)

## 2024-04-03 NOTE — Telephone Encounter (Signed)
Upon patient request DME selection is [facility]. Patient understands he will be contacted by [facility] to set up his cpap. Patient understands to call if [facility] does not contact him with new setup in a timely manner. Patient understands they will be called once confirmation has been received from [facility]/ that they have received their new machine to schedule 10 week follow up appointment.   [facility] notified of new cpap order  Please add to airview Patient was grateful for the call and thanked me. 

## 2024-04-03 NOTE — Assessment & Plan Note (Signed)
 Statin intolerant.  Continue ezetimibe 

## 2024-04-03 NOTE — Assessment & Plan Note (Signed)
 Flu d/w pt.  To be done this fall.   Shingles prev done at [facility] per patient report.   PNA UTD Tetanus 2020 covid vaccine 2021 Colonoscopy 2018 PSA deferred  Advance directive- wife and daughter equally designated if patient were incapacitated.

## 2024-04-03 NOTE — Assessment & Plan Note (Signed)
 With some diarrhea.  Cut the metformin  back to 1 tab a day.  Recheck in about 3 months.  A1c at the visit.  Labs d/w pt.   B12 pending.

## 2024-04-03 NOTE — Telephone Encounter (Signed)
 Pt called in and stated he was turning [place] call.  He received a VM   Best number [phone]

## 2024-04-03 NOTE — Assessment & Plan Note (Signed)
 Statin intolerant

## 2024-04-03 NOTE — Assessment & Plan Note (Signed)
 Continue amlodipine  but cut back to 5 mg from 10 mg to see if he is less lightheaded and see if he feels better.  Continue dofetilide  isosorbide  and losartan .  Continue spironolactone .

## 2024-04-03 NOTE — Assessment & Plan Note (Signed)
Advance directive-wife and daughter equally designated if patient were incapacitated. 

## 2024-04-03 NOTE — Assessment & Plan Note (Signed)
  MMSE 28 out of 30.  He was 1 day off on the day of the week and the day of the month.  Otherwise normal testing.  No emergent symptoms.  Check B12.  Check MRI given his history, see previous MRI report.

## 2024-04-04 ENCOUNTER — Telehealth: Payer: Self-pay | Admitting: Physician Assistant

## 2024-04-04 NOTE — Telephone Encounter (Signed)
 Pt needs to talk with christy about doing an MRI. 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

## 2024-04-04 NOTE — Telephone Encounter (Signed)
Mychart message sent to patient with contact information. 

## 2024-04-04 NOTE — Telephone Encounter (Signed)
 Patient advised he needs to be seen, will schedule a visit with Camie Dina RIGGERS.

## 2024-04-04 NOTE — Telephone Encounter (Signed)
 This patient has not been seen over a year, Never had MRI done, not scheduled for an appt. Will call patient back.

## 2024-04-08 DIAGNOSIS — E119 Type 2 diabetes mellitus without complications: Secondary | ICD-10-CM | POA: Diagnosis not present

## 2024-04-08 NOTE — Addendum Note (Signed)
 Addended by: CLEATUS ARLYSS RAMAN on: 04/08/2024 09:37 PM   Modules accepted: Orders

## 2024-04-09 ENCOUNTER — Encounter: Payer: Self-pay | Admitting: *Deleted

## 2024-04-10 LAB — CUP PACEART REMOTE DEVICE CHECK
Battery Remaining Longevity: 96 mo
Battery Remaining Percentage: 94 %
Battery Voltage: 2.99 V
Brady Statistic AP VP Percent: 20 %
Brady Statistic AP VS Percent: 58 %
Brady Statistic AS VP Percent: 3.1 %
Brady Statistic AS VS Percent: 18 %
Brady Statistic RA Percent Paced: 42 %
Brady Statistic RV Percent Paced: 52 %
Date Time Interrogation Session: 20250829215611
Implantable Lead Connection Status: 753985
Implantable Lead Connection Status: 753985
Implantable Lead Implant Date: 20241125
Implantable Lead Implant Date: 20241125
Implantable Lead Location: 753859
Implantable Lead Location: 753860
Implantable Pulse Generator Implant Date: 20241125
Lead Channel Impedance Value: 430 Ohm
Lead Channel Impedance Value: 440 Ohm
Lead Channel Pacing Threshold Amplitude: 0.5 V
Lead Channel Pacing Threshold Amplitude: 1.5 V
Lead Channel Pacing Threshold Pulse Width: 0.5 ms
Lead Channel Pacing Threshold Pulse Width: 0.5 ms
Lead Channel Sensing Intrinsic Amplitude: 12 mV
Lead Channel Sensing Intrinsic Amplitude: 5 mV
Lead Channel Setting Pacing Amplitude: 2.5 V
Lead Channel Setting Pacing Amplitude: 2.5 V
Lead Channel Setting Pacing Pulse Width: 0.5 ms
Lead Channel Setting Sensing Sensitivity: 2 mV
Pulse Gen Model: 2272
Pulse Gen Serial Number: 8231918

## 2024-04-10 LAB — MICROALBUMIN / CREATININE URINE RATIO
EGFR: 90
Microalb Creat Ratio: 30

## 2024-04-10 LAB — BASIC METABOLIC PANEL WITH GFR: Creatinine: 0.7 (ref 0.6–1.3)

## 2024-04-12 ENCOUNTER — Ambulatory Visit: Payer: Self-pay | Admitting: Internal Medicine

## 2024-04-14 ENCOUNTER — Encounter (HOSPITAL_COMMUNITY): Payer: Self-pay

## 2024-04-15 NOTE — Progress Notes (Signed)
 Remote PPM Transmission

## 2024-04-18 ENCOUNTER — Other Ambulatory Visit: Payer: Self-pay

## 2024-04-18 ENCOUNTER — Ambulatory Visit (HOSPITAL_COMMUNITY): Admission: RE | Admit: 2024-04-18 | Source: Ambulatory Visit

## 2024-04-18 ENCOUNTER — Emergency Department (HOSPITAL_COMMUNITY)

## 2024-04-18 ENCOUNTER — Emergency Department (HOSPITAL_COMMUNITY)
Admission: EM | Admit: 2024-04-18 | Discharge: 2024-04-18 | Disposition: A | Discharge status: Home / Self Care | Attending: Emergency Medicine | Admitting: Emergency Medicine

## 2024-04-18 DIAGNOSIS — Z950 Presence of cardiac pacemaker: Secondary | ICD-10-CM | POA: Insufficient documentation

## 2024-04-18 DIAGNOSIS — I10 Essential (primary) hypertension: Secondary | ICD-10-CM | POA: Insufficient documentation

## 2024-04-18 DIAGNOSIS — R5381 Other malaise: Secondary | ICD-10-CM | POA: Diagnosis not present

## 2024-04-18 DIAGNOSIS — Z79899 Other long term (current) drug therapy: Secondary | ICD-10-CM | POA: Insufficient documentation

## 2024-04-18 DIAGNOSIS — I482 Chronic atrial fibrillation, unspecified: Secondary | ICD-10-CM | POA: Diagnosis not present

## 2024-04-18 DIAGNOSIS — U071 COVID-19: Secondary | ICD-10-CM | POA: Insufficient documentation

## 2024-04-18 DIAGNOSIS — R509 Fever, unspecified: Secondary | ICD-10-CM | POA: Diagnosis not present

## 2024-04-18 DIAGNOSIS — Z7984 Long term (current) use of oral hypoglycemic drugs: Secondary | ICD-10-CM | POA: Insufficient documentation

## 2024-04-18 DIAGNOSIS — E119 Type 2 diabetes mellitus without complications: Secondary | ICD-10-CM | POA: Diagnosis not present

## 2024-04-18 DIAGNOSIS — R059 Cough, unspecified: Secondary | ICD-10-CM | POA: Diagnosis not present

## 2024-04-18 DIAGNOSIS — R110 Nausea: Secondary | ICD-10-CM | POA: Diagnosis not present

## 2024-04-18 DIAGNOSIS — Z7901 Long term (current) use of anticoagulants: Secondary | ICD-10-CM | POA: Insufficient documentation

## 2024-04-18 DIAGNOSIS — J449 Chronic obstructive pulmonary disease, unspecified: Secondary | ICD-10-CM | POA: Diagnosis not present

## 2024-04-18 DIAGNOSIS — R001 Bradycardia, unspecified: Secondary | ICD-10-CM | POA: Diagnosis not present

## 2024-04-18 LAB — CBC WITH DIFFERENTIAL/PLATELET
Abs Immature Granulocytes: 0.03 K/uL (ref 0.00–0.07)
Basophils Absolute: 0 K/uL (ref 0.0–0.1)
Basophils Relative: 0 %
Eosinophils Absolute: 0 K/uL (ref 0.0–0.5)
Eosinophils Relative: 0 %
HCT: 42.7 % (ref 39.0–52.0)
Hemoglobin: 14.7 g/dL (ref 13.0–17.0)
Immature Granulocytes: 0 %
Lymphocytes Relative: 15 %
Lymphs Abs: 1.2 K/uL (ref 0.7–4.0)
MCH: 33.3 pg (ref 26.0–34.0)
MCHC: 34.4 g/dL (ref 30.0–36.0)
MCV: 96.6 fL (ref 80.0–100.0)
Monocytes Absolute: 0.6 K/uL (ref 0.1–1.0)
Monocytes Relative: 8 %
Neutro Abs: 6 K/uL (ref 1.7–7.7)
Neutrophils Relative %: 77 %
Platelets: 159 K/uL (ref 150–400)
RBC: 4.42 MIL/uL (ref 4.22–5.81)
RDW: 12.3 % (ref 11.5–15.5)
WBC: 7.9 K/uL (ref 4.0–10.5)
nRBC: 0 % (ref 0.0–0.2)

## 2024-04-18 LAB — COMPREHENSIVE METABOLIC PANEL WITH GFR
ALT: 15 U/L (ref 0–44)
AST: 23 U/L (ref 15–41)
Albumin: 4 g/dL (ref 3.5–5.0)
Alkaline Phosphatase: 56 U/L (ref 38–126)
Anion gap: 15 (ref 5–15)
BUN: 12 mg/dL (ref 8–23)
CO2: 20 mmol/L — ABNORMAL LOW (ref 22–32)
Calcium: 9.7 mg/dL (ref 8.9–10.3)
Chloride: 100 mmol/L (ref 98–111)
Creatinine, Ser: 1.03 mg/dL (ref 0.61–1.24)
GFR, Estimated: >60 mL/min (ref >60)
Glucose, Bld: 141 mg/dL — ABNORMAL HIGH (ref 70–99)
Potassium: 4.2 mmol/L (ref 3.5–5.1)
Sodium: 135 mmol/L (ref 135–145)
Total Bilirubin: 1.8 mg/dL — ABNORMAL HIGH (ref 0.0–1.2)
Total Protein: 6.6 g/dL (ref 6.5–8.1)

## 2024-04-18 LAB — RESP PANEL BY RT-PCR (RSV, FLU A&B, COVID)  RVPGX2
Influenza A by PCR: NEGATIVE
Influenza B by PCR: NEGATIVE
Resp Syncytial Virus by PCR: NEGATIVE
SARS Coronavirus 2 by RT PCR: POSITIVE — AB

## 2024-04-18 MED ORDER — ACETAMINOPHEN 500 MG PO TABS
1000.0000 mg | ORAL_TABLET | ORAL | Status: AC
  Administered 2024-04-18: 1000 mg via ORAL
  Filled 2024-04-18: qty 2

## 2024-04-18 NOTE — ED Notes (Signed)
 Spoke with lab and asked them to run labs now. Lab states they will.

## 2024-04-18 NOTE — ED Provider Notes (Signed)
 [place] EMERGENCY DEPARTMENT AT [facility] Provider Note   CSN: [id number] Arrival date & time: 04/18/24  1441     Patient presents with: Fatigue, Cough, and Fever   [name] [name] is a 83 y.o. male.   83 year old male with a history of diabetes, hypertension, sinus pause status post pacemaker, and atrial fibrillation who presents emergency department with cough and fever and fatigue.  Symptoms started last night.  Nonproductive cough.  Subjective fever but has not recorded his temperature.  Has not taken any medicine for it.  Was afebrile in triage.  Also has had some nausea but no vomiting.  No diarrhea. No urinary changes.  Says that he called 911 because it was easier to come to the hospital and he does not like urgent care.       Prior to Admission medications   Medication Sig Start Date End Date Taking? Authorizing Provider  acyclovir  (ZOVIRAX ) 800 MG tablet TAKE 1 TABLET TWICE A DAY 06/19/23   Cleatus Arlyss RAMAN, MD  amLODipine  (NORVASC ) 10 MG tablet Take 0.5 tablets (5 mg total) by mouth daily. 04/02/24   Cleatus Arlyss RAMAN, MD  apixaban  (ELIQUIS ) 5 MG TABS tablet TAKE 1 TABLET TWICE A DAY 11/16/23   Gollan, Timothy J, MD  carboxymethylcellulose (REFRESH TEARS) 0.5 % SOLN Place 1 drop into both eyes 2 (two) times daily.    [provider]  COD LIVER OIL PO Take 10 mcg by mouth 2 (two) times daily.    [provider]  dofetilide  (TIKOSYN ) 500 MCG capsule Take 1 capsule (500 mcg total) by mouth 2 (two) times daily. 11/13/23   Gollan, Timothy J, MD  ezetimibe  (ZETIA ) 10 MG tablet TAKE 1 TABLET DAILY 08/21/23   Gollan, Timothy J, MD  gabapentin  (NEURONTIN ) 100 MG capsule TAKE 1 TO 2 CAPSULES THREE TIMES A DAY 01/19/24   Cleatus Arlyss RAMAN, MD  isosorbide  mononitrate (IMDUR ) 30 MG 24 hr tablet TAKE 1 TABLET DAILY 06/27/23   Vivienne Lonni Ingle, NP  JUBLIA 10 % SOLN SMARTSIG:Applicator Topical Daily 02/05/24   [provider]  losartan  (COZAAR ) 100 MG  tablet TAKE 1 TABLET DAILY (MUST KEEP UPCOMING APPOINTMENT IN JULY 2024 WITH DR WADDELL BEFORE ANYMORE REFILLS, FINAL ATTEMPT) 04/18/23   WADDELL Danelle ORN, MD  metFORMIN  (GLUCOPHAGE ) 850 MG tablet Take 1 tablet (850 mg total) by mouth daily with breakfast. 04/02/24   Cleatus Arlyss RAMAN, MD  nitroGLYCERIN  (NITROSTAT ) 0.4 MG SL tablet DISSOLVE 1 TABLET UNDER THE TONGUE EVERY 5 MINUTES AS NEEDED FOR CHEST PAIN (MAXIMUM 3 DOSES) 08/21/23   Gollan, Timothy J, MD  nystatin  (MYCOSTATIN /NYSTOP ) powder Apply 1 Application topically 2 (two) times daily. Use until resolved and then 2 extra days. 10/18/22   Cleatus Arlyss RAMAN, MD  Omega-3 Fatty Acids (FISH OIL) 1000 MG CAPS Take 1,000 mg by mouth 2 (two) times daily. [employer], Historical, MD  pantoprazole  (PROTONIX ) 40 MG tablet Take 1 tablet (40 mg total) by mouth daily. 07/21/23   Dunn, Bernardino HERO, PA-C  spironolactone  (ALDACTONE ) 25 MG tablet Take 1 tablet (25 mg total) by mouth daily. 12/11/23   Gollan, Timothy J, MD    Allergies: Fenofibrate , Fire ant, and Statins    Review of Systems  Updated Vital Signs BP 134/64   Pulse 67   Temp 100 F (37.8 C) (Oral)   Resp 20   Ht 5' 7 (1.702 m)   Wt 70.3 kg   SpO2 97%  BMI 24.28 kg/m   Physical Exam Vitals and nursing note reviewed.  Constitutional:      General: He is not in acute distress.    Appearance: He is well-developed.  HENT:     Head: Normocephalic and atraumatic.     Right Ear: Tympanic membrane, ear canal and external ear normal.     Left Ear: Tympanic membrane, ear canal and external ear normal.     Nose: Nose normal.     Mouth/Throat:     Pharynx: Posterior oropharyngeal erythema present. No oropharyngeal exudate.  Eyes:     Extraocular Movements: Extraocular movements intact.     Conjunctiva/sclera: Conjunctivae normal.     Pupils: Pupils are equal, round, and reactive to light.  Cardiovascular:     Rate and Rhythm: Normal rate. Rhythm irregular.     Heart sounds:  Normal heart sounds. No murmur heard. Pulmonary:     Effort: Pulmonary effort is normal. No respiratory distress.     Breath sounds: Normal breath sounds.  Musculoskeletal:     Cervical back: Normal range of motion and neck supple.     Right lower leg: No edema.     Left lower leg: No edema.  Skin:    General: Skin is warm and dry.  Neurological:     Mental Status: He is alert. Mental status is at baseline.  Psychiatric:        Mood and Affect: Mood normal.        Behavior: Behavior normal.     (all labs ordered are listed, but only abnormal results are displayed) Labs Reviewed  RESP PANEL BY RT-PCR (RSV, FLU A&B, COVID)  RVPGX2 - Abnormal; Notable for the following components:      Result Value   SARS Coronavirus 2 by RT PCR POSITIVE (*)    All other components within normal limits  COMPREHENSIVE METABOLIC PANEL WITH GFR - Abnormal; Notable for the following components:   CO2 20 (*)    Glucose, Bld 141 (*)    Total Bilirubin 1.8 (*)    All other components within normal limits  CBC WITH DIFFERENTIAL/PLATELET    EKG: EKG Interpretation Date/Time:  Thursday April 18 2024 14:54:05 EDT Ventricular Rate:  68 PR Interval:    QRS Duration:  126 QT Interval:  452 QTC Calculation: 459 R Axis:   66  Text Interpretation: Atrial fibrillation Ventricular premature complex Nonspecific intraventricular conduction delay Probable anteroseptal infarct, old Repol abnrm suggests ischemia, lateral leads Confirmed by Yolande Charleston [phone]) on 04/18/2024 3:52:22 PM  Radiology: DG Chest 2 View Result Date: 04/18/2024 EXAM: 2 VIEW(S) XRAY OF THE CHEST 04/18/2024 03:40:00 PM COMPARISON: None available. CLINICAL HISTORY: Fever, cough. Per triage notes: Pt bib ems from home; c/o malaise since last night; also endorsing cough and fever; denies urinary issues, denies known sick contacts; denies pain. FINDINGS: LUNGS AND PLEURA: No focal pulmonary opacity. No pulmonary edema. No pleural  effusion. No pneumothorax. 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 Pacing wires are stable. BONES AND SOFT TISSUES: No acute osseous abnormality. Atherosclerotic changes are present at the aortic arch and proximal descending thoracic aorta. IMPRESSION: 1. No acute process. 2. Changes of COPD. Electronically signed by: Lonni Necessary MD 04/18/2024 03:43 PM EDT RP Workstation: [id number]     Procedures   Medications Ordered in the ED  acetaminophen  (TYLENOL ) tablet 1,000 mg (1,000 mg Oral Given 04/18/24 1826)  Medical Decision Making Amount and/or Complexity of Data Reviewed Labs: ordered. Radiology: ordered.  Risk OTC drugs.   [name] [name] is a 83 year old male with a history of diabetes, hypertension, sinus pause status post pacemaker, and atrial fibrillation who presents emergency department with cough and fever and fatigue.  Initial Ddx:  URI, pneumonia, sepsis, hypoxia  MDM/Course:  Patient presents emergency department with URI type symptoms.  Started last night.  Satting well on room air.  Overall well-appearing.  No abnormal lung sounds.  Does not appear to be volume overloaded.  Saw to be COVID-positive.  Chest x-ray without pneumonia.  Did have lab work that was sent as well that was unremarkable.  Do not feel that he needs admission at this point in time for his COVID since he is satting well on room air and is overall very well-appearing.  Did discuss with pharmacy and unfortunately he is not a Paxlovid candidate because of his Eliquis  use.  Discharged home with instructions to follow-up with his primary doctor  This patient presents to the ED for concern of complaints listed in HPI, this involves an extensive number of treatment options, and is a complaint that carries with it a high risk of complications and morbidity. Disposition including potential need for admission considered.    Dispo: DC Home. Return precautions discussed including, but not limited to, those listed in the AVS. Allowed pt time to ask questions which were answered fully prior to dc.  Additional history obtained from friend Records reviewed Outpatient Clinic Notes The following labs were independently interpreted: Chemistry and show no acute abnormality I independently reviewed the following imaging with scope of interpretation limited to determining acute life threatening conditions related to emergency care: Chest x-ray and agree with the radiologist interpretation with the following exceptions: none I personally reviewed and interpreted the pt's EKG: see above for interpretation  I have reviewed the patients home medications and made adjustments as needed Social Determinants of health:  Geriatric  Portions of this note were generated with Scientist, clinical (histocompatibility and immunogenetics). Dictation errors may occur despite best attempts at proofreading.     Final diagnoses:  COVID-19    ED Discharge Orders     None          Yolande Lamar BROCKS, MD 04/18/24 2307

## 2024-04-18 NOTE — ED Notes (Signed)
 Help get patient into a GOWN ON THE MONITOR DID EKG SHOWN TO ER PROVIDER PATIENT IS RESTING WITH CALL BELL IN REACH

## 2024-04-18 NOTE — ED Triage Notes (Signed)
 Pt bib ems from home; c/o malaise since last night; also endorsing cough and fever; denies urinary issues, denies known sick contacts; denies pain

## 2024-04-18 NOTE — Discharge Instructions (Signed)
 You were seen for your COVID infection in the emergency department.  Unfortunately you are not a candidate for Paxlovid because of your Eliquis  use.  At home, please use Tylenol  and for your muscle aches and fevers.  Please use over-the-counter cough medication or tea with honey for your cough.  Follow-up with your primary doctor in 2-3 days regarding your visit.  This may be over the phone.  Return immediately to the emergency department if you experience any of the following: Difficulty breathing, or any other concerning symptoms.    Thank you for visiting our Emergency Department. It was a pleasure taking care of you today.

## 2024-04-18 NOTE — ED Notes (Signed)
 Patient transported to X-ray

## 2024-04-18 NOTE — ED Notes (Signed)
 Spoke with lab, they state they are running them now.[name]

## 2024-04-19 NOTE — CV Procedure (Signed)
  Device system confirmed to be MRI conditional, with implant date > 6 weeks ago, and no evidence of abandoned or epicardial leads in review of most recent CXR  Device last cleared by EP Provider: Charlies Arthur 04/19/24  Clearance is good through for 1 year as long as parameters remain stable at time of check. If pt undergoes a cardiac device procedure during that time, they should be re-cleared.   Tachy-therapies to be programmed off if applicable with device back to pre-MRI settings after completion of exam.  [employer] will be present for programming for the MRI.   Rocky Catalan, RT  04/19/2024 4:21 PM

## 2024-04-23 ENCOUNTER — Ambulatory Visit (HOSPITAL_COMMUNITY)
Admission: RE | Admit: 2024-04-23 | Discharge: 2024-04-23 | Disposition: A | Discharge status: Home / Self Care | Source: Ambulatory Visit | Attending: Family Medicine | Admitting: Family Medicine

## 2024-04-23 DIAGNOSIS — R413 Other amnesia: Secondary | ICD-10-CM | POA: Insufficient documentation

## 2024-04-23 NOTE — Progress Notes (Signed)
 Patient was monitored by this RN during MRI scan due to presence of a pacemaker. Cardiac rhythm was continuously monitored throughout the procedure. Prior to the start of the scan, the pacemaker was placed in MRI-safe mode by the MRI technician and/or pacemaker representative. Following the completion of the scan, the device was returned to its pre-MRI settings. Neurological status and orientation post-procedure were unchanged from baseline. [employer] representative programmed device to DOO 85 bpm and then reprogrammed after scan.   Pre-procedure Heart Rate (Prior to being placed in MRI safe mode):60 bpm Post-procedure Heart Rate (Once pacemaker is returned to baseline mode): 60 bpm

## 2024-04-26 ENCOUNTER — Encounter: Payer: Self-pay | Admitting: Physician Assistant

## 2024-04-26 ENCOUNTER — Ambulatory Visit (INDEPENDENT_AMBULATORY_CARE_PROVIDER_SITE_OTHER): Admitting: Physician Assistant

## 2024-04-26 VITALS — BP 128/69 | HR 74 | Resp 20 | Wt 153.0 lb

## 2024-04-26 DIAGNOSIS — R413 Other amnesia: Secondary | ICD-10-CM

## 2024-04-26 NOTE — Patient Instructions (Signed)
 Monitor driving   Neurocognitive testing

## 2024-04-26 NOTE — Progress Notes (Signed)
 Assessment/Plan:   Memory Impairment of unclear etiology   [name] LOPER Sr. is a very pleasant 83 y.o. 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  As recalled, the patient had an evaluation on 09/23/2022 he had an episode of confusion but failed to follow-up since.  A number of tests were ordered, but patient did not proceed.  During the visit to his primary care physician it was reported that he had memory concerns and another visit was scheduled for today.  MMSE is 29/30 . Patient is able to participate on ADLs and to drive but he reports being more cautious about it.  Mood is stable    Follow up in 6 months. MRI of the brain was ordered by his PCP, with results pending  Keep neuropsych testing for diagnostic clarity  Recommend good control of her cardiovascular risk factors, follow-up with cardiology Continue to control mood as per PCP Monitor driving     Subjective:    This patient is here alone.  Previous records as well as any outside records available were reviewed prior to todays visit. Patient was last seen on 09/2022   Any changes in memory since last visit?  A little worse, especially STM, but he reports that his LTM is involved as well repeats oneself?  Endorsed Disoriented when walking into a room? Denies    Leaving objects?  May misplace things all the time. Sometimes he leaves items at the grocery store.   Wandering behavior?  Denies. Not a problem, [place]   Any personality changes since last visit?  Denies.   Any worsening depression?:  Denies.   Hallucinations or paranoia?  Denies.   Seizures? denies    Any sleep changes?  Sleeps well. Denies vivid dreams, REM behavior or sleepwalking   Sleep apnea?  Endorsed, to get CPAP  next week. Any hygiene concerns? Denies.  Independent of bathing and dressing?  Endorsed  Does the patient needs help with medications?  Patient is in charge, has a pillbox   Who is in charge of the finances?  Patient is in charge     Any changes in appetite?  Denies.     Patient have trouble swallowing? Denies.   Does the patient cook? No I wish I did Any headaches?   denies   Any vision changes?  Chronic back pain  denies   Ambulates with difficulty? Denies.    Recent falls or head injuries? Denies. Sometimes I stagger but no stroke symptoms.     Unilateral weakness, numbness or tingling? Denies  Any tremors?  Denies   Any anosmia?  Denies   Any incontinence of urine?  Endorsed, attributes to drinking a lot of fluids.  Any bowel dysfunction?  Denies      Patient lives  with his wife, who was recently hospitalized with MRSA  Does the patient drive?  He became lost on his way here, but he is more cautious when driving.  Initial visit 01/22/2023  He was in his usual state of health until the morning of 09/22/2022.  Per report, at 8:30 AM, he went out to work on the tractor, having an appointment at 10 AM in [place].  His wife went out to see if  he was leaving to the appointment, but she found him walking around outside a tractor and confused.  At the time, he was complaining of mild headache in the center, on top of the head.  She is not sure if he fell or he lost consciousness.  Per report, the patient was confused about the appointment, and he was not able to remember of this event or why he got out of the tractor.  He also does not recall talking to his wife about the appointments or anything else.  She did not notice any focal weakness, or facial droop or speech changes or vision changes at the time.  He denies any numbness.  He never had a similar event. No strenuous exercise. No stress.  At the ER, blood work was reassuring, UA was negative.  CT of the head and neck was without  acute findings, but chronic small vessel ischemia.  Neurology was consulted by phone (Dr. Michaela).  He was not concerned about TIA or CVA.  He could have possibly had absence versus partial seizures.  No other workup was performed at the ER, and he was referred here for other workup.  At the time of discharge he was neurologically intact.   Recent stent and placed on Plavix  2 months ago  Changes in meds?  Plavix   75 mg added to Eliquis  5 mg     R or L handed? R History seizure? NO Witnessed? Tremor:  NO  Voice: NO Hallucinations: NO             visual distortions Sees double once in a while, if I close either eye, it is ok lasts 10 seconds. No blackout.  He reports he is scheduled to see his ophthalmologist soon.             Auditory hallucinations? NO  Taste of blood or metallic taste? NO Nausea and Vomiting? NO Lightheadedness/ Dizziness/vertigo?  NO             Syncope? 2 months ago in the parking lot , ? Bradycardic (according to wife)    History of encephalitis or meningitis? NO History of Headaches? Endorsed , as above  Head Trauma/Injuries? NO  Loss of smell:  NO Loss of taste :  NO Urinary or Bowel Incontinence :NO Difficulty Swallowing ?NO  Trouble with ADL's? NO              Trouble buttoning clothing? NO   Mood Changes? Depression NO Memory changes: Not as much  Sleep:  How many hours?  I sleep less, about 4-5 h, take 2-3 h in the afternoon              Rested upon waking up? Yes  ETOH? NO  never Tobacco? NO never Recreational Drugs? Never  Caffeine? 1 cup in the morning, decaffeinated     Memory:    How long did patient have memory difficulties?  I can go to the store, buy bread, but I cannot remember where I put it . Patient has some difficulty remembering recent conversations and people names that he just meet, but never did. LTM is good repeats oneself?  Endorsed, but not that much Disoriented when walking into a room?  Patient denies other  than the recent event.  Leaving objects in unusual places? denies   Wandering behavior?  denies   Any personality changes since last visit?  Patient denies   Any worsening depression?:  Patient denies   Hallucinations or paranoia?  Patient denies   Sleep apnea?  Patient denies, but he snores    Any hygiene concerns?  Patient denies   Independent of bathing and dressing?  Endorsed  Does the patient needs help with medications? Patient  is in charge   Who is in charge of the finances?  Patient  is in charge     Any changes in appetite?   denies     Patient have trouble swallowing?   denies   Does the patient cook?   No  Any kitchen accidents such as leaving the stove on? Patient denies   Unilateral weakness, numbness or tingling?  denies   Any tremors?   denies   Patient lives  with his wife in the farm   PREVIOUS MEDICATIONS:   CURRENT MEDICATIONS:  Outpatient Encounter Medications as of 04/26/2024  Medication Sig   acyclovir  (ZOVIRAX ) 800 MG tablet TAKE 1 TABLET TWICE A DAY   amLODipine  (NORVASC ) 10 MG tablet Take 0.5 tablets (5 mg total) by mouth daily.   apixaban  (ELIQUIS ) 5 MG TABS tablet TAKE 1 TABLET TWICE A DAY   carboxymethylcellulose (REFRESH TEARS) 0.5 % SOLN Place 1 drop into both eyes 2 (two) times daily.   COD LIVER OIL PO Take 10 mcg by mouth 2 (two) times daily.   dofetilide  (TIKOSYN ) 500 MCG capsule Take 1 capsule (500 mcg total) by mouth 2 (two) times daily.   ezetimibe  (ZETIA ) 10 MG tablet TAKE 1 TABLET DAILY   gabapentin  (NEURONTIN ) 100 MG capsule TAKE 1 TO 2 CAPSULES THREE TIMES A DAY   isosorbide  mononitrate (IMDUR ) 30 MG 24 hr tablet TAKE 1 TABLET DAILY   JUBLIA 10 % SOLN SMARTSIG:Applicator Topical Daily   losartan  (COZAAR ) 100 MG tablet TAKE 1 TABLET DAILY (MUST KEEP UPCOMING APPOINTMENT IN JULY 2024 WITH DR WADDELL BEFORE ANYMORE REFILLS, FINAL ATTEMPT)   metFORMIN  (GLUCOPHAGE ) 850 MG tablet Take 1 tablet (850 mg total) by mouth daily with breakfast.    nitroGLYCERIN  (NITROSTAT ) 0.4 MG SL tablet DISSOLVE 1 TABLET UNDER THE TONGUE EVERY 5 MINUTES AS NEEDED FOR CHEST PAIN (MAXIMUM 3 DOSES)   nystatin  (MYCOSTATIN /NYSTOP ) powder Apply 1 Application topically 2 (two) times daily. Use until resolved and then 2 extra days.   Omega-3 Fatty Acids (FISH OIL) 1000 MG CAPS Take 1,000 mg by mouth 2 (two) times daily. [employer]   pantoprazole  (PROTONIX ) 40 MG tablet Take 1 tablet (40 mg total) by mouth daily.   spironolactone  (ALDACTONE ) 25 MG tablet Take 1 tablet (25 mg total) by mouth daily.   No facility-administered encounter medications on file as of 04/26/2024.       04/27/2024    9:00 PM 08/16/2017    2:30 PM 07/26/2016    8:25 AM  MMSE - Mini Mental State Exam  Orientation to time 4 5  5    Orientation to Place 5 5  5    Registration 3 3  3    Attention/ Calculation 5 0  0   Recall 3 3  3    Language- name 2 objects 2 0  0   Language- repeat 1 1 1   Language- follow 3 step command 3 3  3    Language- read & follow direction 1 0  0   Write a sentence 1 0  0   Copy design 1 0  0   Total score 29 20  20       Data saved with a previous flowsheet row definition       No  data to display          Objective:     PHYSICAL EXAMINATION:    VITALS:   Vitals:   04/26/24 0939  BP: 128/69  Pulse: 74  Resp: 20  SpO2: 96%  Weight: 153 lb (69.4 kg)    GEN:  The patient appears stated age and is in NAD. HEENT:  Normocephalic, atraumatic.   Neurological examination:  General: NAD, well-groomed, appears stated age. Orientation: The patient is alert. Oriented to person, place and date Cranial nerves: There is good facial symmetry.The speech is fluent and clear. No aphasia or dysarthria. Fund of knowledge is appropriate. Recent and remote memory are impaired. Attention and concentration are reduced. Able to name objects and repeat phrases.  Hearing is intact to conversational tone.  Sensation: Sensation is intact to light touch  throughout Motor: Strength is at least antigravity x4. DTR's 2/4 in UE/LE     Movement examination: Tone: There is normal tone in the UE/LE Abnormal movements:  no tremor.  No myoclonus.  No asterixis.   Coordination:  There is no decremation with RAM's. Normal finger to nose  Gait and Station: The patient has no difficulty arising out of a deep-seated chair without the use of the hands. The patient's stride length is good.  Gait is cautious and narrow.    Thank you for allowing us  the opportunity to participate in the care of this nice patient. Please do not hesitate to contact us  for any questions or concerns.   Total time spent on today's visit was 28 minutes dedicated to this patient today, preparing to see patient, examining the patient, ordering tests and/or medications and counseling the patient, documenting clinical information in the EHR or other health record, independently interpreting results and communicating results to the patient/family, discussing treatment and goals, answering patient's questions and coordinating care.  Cc:  Cleatus Arlyss RAMAN, MD  Camie Sevin 04/27/2024 9:06 PM

## 2024-04-27 ENCOUNTER — Other Ambulatory Visit: Payer: Self-pay | Admitting: Internal Medicine

## 2024-04-27 ENCOUNTER — Other Ambulatory Visit: Payer: Self-pay | Admitting: Cardiovascular Disease

## 2024-04-27 DIAGNOSIS — I4819 Other persistent atrial fibrillation: Secondary | ICD-10-CM

## 2024-04-29 NOTE — Telephone Encounter (Signed)
 Prescription refill request for Eliquis  received. Indication:afib Last office visit:7/25 Scr:1.03  9/25 Age: 83 Weight:69.4  kg  Prescription refilled

## 2024-05-02 ENCOUNTER — Encounter: Payer: Self-pay | Admitting: Family Medicine

## 2024-05-08 DIAGNOSIS — E119 Type 2 diabetes mellitus without complications: Secondary | ICD-10-CM | POA: Diagnosis not present

## 2024-05-31 DIAGNOSIS — G4733 Obstructive sleep apnea (adult) (pediatric): Secondary | ICD-10-CM | POA: Diagnosis not present

## 2024-06-08 DIAGNOSIS — E119 Type 2 diabetes mellitus without complications: Secondary | ICD-10-CM | POA: Diagnosis not present

## 2024-06-10 ENCOUNTER — Other Ambulatory Visit: Payer: Self-pay | Admitting: Cardiovascular Disease

## 2024-06-12 ENCOUNTER — Telehealth: Payer: Self-pay

## 2024-06-12 MED ORDER — EZETIMIBE 10 MG PO TABS: 10.0000 mg | ORAL_TABLET | Freq: Every day | ORAL | 1 refills | Status: AC

## 2024-06-12 NOTE — Telephone Encounter (Signed)
 [name] [name]

## 2024-06-13 ENCOUNTER — Other Ambulatory Visit: Payer: Self-pay

## 2024-06-13 MED ORDER — METFORMIN HCL 850 MG PO TABS: 850.0000 mg | ORAL_TABLET | Freq: Every day | ORAL | 1 refills | Status: DC

## 2024-06-17 ENCOUNTER — Ambulatory Visit: Attending: Physician Assistant | Admitting: Physician Assistant

## 2024-06-17 ENCOUNTER — Telehealth: Payer: Self-pay | Admitting: Family Medicine

## 2024-06-17 ENCOUNTER — Other Ambulatory Visit: Payer: Self-pay | Admitting: Nurse Practitioner

## 2024-06-17 ENCOUNTER — Other Ambulatory Visit: Payer: Self-pay | Admitting: Family Medicine

## 2024-06-17 ENCOUNTER — Encounter: Payer: Self-pay | Admitting: Physician Assistant

## 2024-06-17 VITALS — BP 120/60 | HR 63 | Ht 67.0 in | Wt 157.0 lb

## 2024-06-17 DIAGNOSIS — Z79899 Other long term (current) drug therapy: Secondary | ICD-10-CM

## 2024-06-17 DIAGNOSIS — Z789 Other specified health status: Secondary | ICD-10-CM

## 2024-06-17 DIAGNOSIS — Z950 Presence of cardiac pacemaker: Secondary | ICD-10-CM

## 2024-06-17 DIAGNOSIS — I25810 Atherosclerosis of coronary artery bypass graft(s) without angina pectoris: Secondary | ICD-10-CM

## 2024-06-17 DIAGNOSIS — B3742 Candidal balanitis: Secondary | ICD-10-CM

## 2024-06-17 DIAGNOSIS — Z951 Presence of aortocoronary bypass graft: Secondary | ICD-10-CM

## 2024-06-17 DIAGNOSIS — I4819 Other persistent atrial fibrillation: Secondary | ICD-10-CM | POA: Diagnosis not present

## 2024-06-17 DIAGNOSIS — E785 Hyperlipidemia, unspecified: Secondary | ICD-10-CM

## 2024-06-17 DIAGNOSIS — I10 Essential (primary) hypertension: Secondary | ICD-10-CM

## 2024-06-17 DIAGNOSIS — I495 Sick sinus syndrome: Secondary | ICD-10-CM

## 2024-06-17 NOTE — Telephone Encounter (Signed)
 Copied from CRM [id number]. Topic: Clinical - Prescription Issue >> Jun 17, 2024 12:49 PM Aleatha C wrote: Reason for CRM:  [facility] script made changes with Metformin  for 1 tablet daily for breakfast and he normally takes 2 daily with meals, and he wants to know if Dr Cleatus made this change please confirm and let him know

## 2024-06-17 NOTE — Patient Instructions (Addendum)
 Medication Instructions:  Your physician recommends that you continue on your current medications as directed. Please refer to the Current Medication list given to you today.   *If you need a refill on your cardiac medications before your next appointment, please call your pharmacy*  Lab Work: Your provider would like for you to have following labs drawn today CBC, BMeT, Mag level.   If you have labs (blood work) drawn today and your tests are completely normal, you will receive your results only by: MyChart Message (if you have MyChart) OR A paper copy in the mail If you have any lab test that is abnormal or we need to change your treatment, we will call you to review the results.  Follow-Up: At [facility], you and your health needs are our priority.  As part of our continuing mission to provide you with exceptional heart care, our providers are all part of one team.  This team includes your primary Cardiologist (physician) and Advanced Practice Providers or APPs (Physician Assistants and Nurse Practitioners) who all work together to provide you with the care you need, when you need it.  Your next appointment:   6 month(s) with Suzann Riddle 12 months with Bernardino Bring, PA-C or Timothy Gollan, MD

## 2024-06-17 NOTE — Progress Notes (Signed)
 Cardiology Office Note    Date:  06/17/2024   ID:  [name] TASSIN Sr., DOB 18-Mar-1941, MRN [id number]  PCP:  Cleatus Arlyss RAMAN, MD  Cardiologist:  Evalene Lunger, MD  Electrophysiologist:  Danelle Birmingham, MD   Chief Complaint: Follow up  History of Present Illness:   [name] RASCHKE Sr. is a 83 y.o. male with history of CAD status post 6-vessel CABG in 2015, status post PCI/DES to the anastomosis of SVG to D2 in 05/2022, persistent A-fib on Tikosyn  managed by EP, sinus node dysfunction status post PPM in 06/2023, CVA, DM2, HTN, HLD, NSVT, PFO, and dilated aortic root who presents for for follow-up of CAD.  He underwent 6 vessel CABG in 08/2013 with postoperative A-fib noted that resolved following a short course of amiodarone  and brief period of warfarin.  He had palpitations in early 2017 with Holter monitoring at that time showing sinus rhythm with PACs. In spring 2018, he was evaluated by neurology in the setting of falls. 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 It was felt that this was most likely explained by paroxysmal atrial fibrillation and he was placed on Eliquis  5 mg twice daily. He was subsequently found to be in atrial fibrillation cardiology clinic in 05/2017 and referred to A-fib clinic. Recommendation was made for Tikosyn  initiation though patient deferred. He subsequently underwent cardioversion in January 2019 which was initially successful however, he had return of A-fib and associated fatigue within approximately 4 days. He was referred back to A-fib clinic and subsequently was admitted and loaded with Tikosyn . In early 2022, he suffered a syncopal episode. He subsequently wore an event monitor which showed predominantly sinus bradycardia at 53 bpm with a range of 35 to 211 bpm. He had 20 brief runs of SVT and a 4% A-fib burden. He also had episodic junctional rhythm and 16 pauses lasting up to 3.6 seconds. He  followed up with electrophysiology Basilio) in March 2022 and was offered permanent pacemaker insertion but patient wished to defer.  In the setting of unstable angina he underwent LHC in 05/2022 that showed severe underlying three-vessel CAD with patent grafts including LIMA to LAD, SVG to OM 2/OM 3, SVG to RCA and SVG to D2.  The SVG to D2 had a severe anastomosis of stenosis which was felt to be the culprit for his angina.  He underwent successful PCI/DES to the anastomosis of SVG to D2 extending into the native vessel.  He underwent repeat outpatient cardiac monitoring in 03/2023 that showed 36% A-fib burden (while on Tikosyn ) with previously noted pauses occurring up to 3.9 seconds.  He subsequently underwent a sleep study.  In the setting of outpatient cardiac monitoring findings he was evaluated by EP and agreed to undergo PPM implantation which was placed in 06/2023.  Device interrogations since pacemaker implantation have demonstrated persistent A-fib burden ranging up to 60% (while on Tikosyn ).  Most recent device interrogation from 04/2024 showed an A-fib burden of 45% since 02/2024 with the EP recommending possible discontinuation of dofetilide  if A-fib burden worsens at most recent visit in 02/2024.  He comes in doing well from a cardiac perspective and is without symptoms of angina or cardiac decompensation.  No palpitations, dizziness, presyncope, or syncope.  No falls, hematochezia, or melena.  No lower extremity swelling or progressive orthopnea.  Adherent and tolerating cardiac pharmacotherapy including dofetilide  and apixaban .  Weight stable.  Does not have any acute  cardiac concerns at this time.   Labs independently reviewed: 04/2024 - potassium 4.2, BUN 12, serum creatinine 1.03, albumin  4.0, AST/ALT normal, Hgb 14.7, PLT 159 03/2024 - TC 126, TG 261, HDL 33, LDL 40, TSH normal, A1c 6.6 12/2023 - magnesium  2.1  Past Medical History:  Diagnosis Date   Anxiety    Back pain    Coronary  artery disease 08/21/2013   a. 08/2013 s/p CABG x 6; b. 05/2022 PCI: LAD 127m, 60d, D2 50, LCX 80ost, 91m, 62m/d, OM1 100, RCA 100p, VG->OM2->OM3 nl, VG->D2 30ost, 99d @ Diag insertion (2.25x15 Onyx Frontier DES), VG->RCA nl, LIMA->LAD nl. EF 55-65%.   Diabetes mellitus    Diabetic neuropathy (HCC)    Diastolic dysfunction    a. 02/2017 Echo: EF 55-60%, no rwma, Gr1 DD, mildly dil Ao Root/RV. + PFO.   Diverticulosis    Diverticulosis    Embolic stroke (HCC)    a. 04/2017 MRI in setting of freq falls: small subacute infarction of bilat centrum semi-ovale consistent w/ embolic event-->Eliquis .   Erectile dysfunction    Family history of anesthesia complication    ' they cant wake my brother very easy   Fatty liver    Flank pain    GERD (gastroesophageal reflux disease)    History of bronchitis    History of chicken pox    HSV infection    ocular symptoms and oral lesions   Hypercholesterolemia    Hypertension    Kidney stones    Osteoarthritis    PAF (paroxysmal atrial fibrillation) (HCC)    a. Had post-op AF 08/2013; b. 09/2015 Holter: PAC's no AF; c. 05/2017 Noted to be in AFib-->Eliquis  (CHA2DS2VASc = 7); d. 08/2017 s/p DCCV-->ERAF-->Tikosyn  added; e. 08/2020 Zio: 4% Afib burden.   PFO (patent foramen ovale)    a. 02/2017 Echo: + PFO.   Sinus node dysfunction (HCC)    a. 08/2020 Zio: Predominantly sinus bradycardia at 53 (35-211).  20 SVT runs.  1 run of nonsustained VT x4 beats.  4% A-fib burden.  16 pauses lasting up to 3.6 seconds.  Intermittent junctional rhythm.  Patient declined pacemaker.    Past Surgical History:  Procedure Laterality Date   CARDIAC CATHETERIZATION  08/21/2013   DR VERLIN   CARDIOVERSION N/A 08/24/2017   Procedure: CARDIOVERSION;  Surgeon: Perla Evalene PARAS, MD;  Location: [facility] ORS;  Service: Cardiovascular;  Laterality: N/A;   CHOLECYSTECTOMY  08/08/1981   COLONOSCOPY WITH PROPOFOL  N/A 01/04/2023   Procedure: COLONOSCOPY WITH PROPOFOL ;  Surgeon: Therisa Bi, MD;  Location: [facility] ENDOSCOPY;  Service: Gastroenterology;  Laterality: N/A;   CORONARY ARTERY BYPASS GRAFT N/A 08/23/2013   Procedure: CORONARY ARTERY BYPASS GRAFTING (CABG);  Surgeon: Dallas KATHEE Jude, MD;  Location: [facility] OR;  Service: Open Heart Surgery;  Laterality: N/A;  CABG times six utilizing the left internal mammary artery and the right greater saphenous vein harvested endoscopically   CORONARY STENT INTERVENTION N/A 05/16/2022   Procedure: CORONARY STENT INTERVENTION;  Surgeon: Darron Deatrice LABOR, MD;  Location: [facility] INVASIVE CV LAB;  Service: Cardiovascular;  Laterality: N/A;   ENDOVEIN HARVEST OF GREATER SAPHENOUS VEIN Right 08/23/2013   Procedure: ENDOVEIN HARVEST OF GREATER SAPHENOUS VEIN;  Surgeon: Dallas KATHEE Jude, MD;  Location: [facility] OR;  Service: Open Heart Surgery;  Laterality: Right;   ESOPHAGOGASTRODUODENOSCOPY (EGD) WITH PROPOFOL  N/A 01/04/2023   Procedure: ESOPHAGOGASTRODUODENOSCOPY (EGD) WITH PROPOFOL ;  Surgeon: Therisa Bi, MD;  Location: [facility] ENDOSCOPY;  Service: Gastroenterology;  Laterality: N/A;   HERNIA REPAIR  X3   INTRAOPERATIVE TRANSESOPHAGEAL ECHOCARDIOGRAM N/A 08/23/2013   Procedure: INTRAOPERATIVE TRANSESOPHAGEAL ECHOCARDIOGRAM;  Surgeon: Dallas KATHEE Jude, MD;  Location: [facility] OR;  Service: Open Heart Surgery;  Laterality: N/A;   LEFT HEART CATH AND CORS/GRAFTS ANGIOGRAPHY N/A 05/16/2022   Procedure: LEFT HEART CATH AND CORS/GRAFTS ANGIOGRAPHY;  Surgeon: Darron Deatrice LABOR, MD;  Location: [facility] INVASIVE CV LAB;  Service: Cardiovascular;  Laterality: N/A;   LEFT HEART CATHETERIZATION WITH CORONARY ANGIOGRAM N/A 08/21/2013   Procedure: LEFT HEART CATHETERIZATION WITH CORONARY ANGIOGRAM;  Surgeon: Lonni JONETTA Cash, MD;  Location: [facility] CATH LAB;  Service: Cardiovascular;  Laterality: N/A;   PACEMAKER IMPLANT N/A 07/03/2023   Procedure: PACEMAKER IMPLANT;  Surgeon: Waddell Danelle ORN, MD;  Location: [facility] INVASIVE CV LAB;  Service: Cardiovascular;  Laterality: N/A;     Current Medications: Current Meds  Medication Sig   acyclovir  (ZOVIRAX ) 800 MG tablet TAKE 1 TABLET TWICE A DAY   amLODipine  (NORVASC ) 10 MG tablet Take 0.5 tablets (5 mg total) by mouth daily.   carboxymethylcellulose (REFRESH TEARS) 0.5 % SOLN Place 1 drop into both eyes 2 (two) times daily.   COD LIVER OIL PO Take 10 mcg by mouth 2 (two) times daily.   dofetilide  (TIKOSYN ) 500 MCG capsule Take 1 capsule (500 mcg total) by mouth 2 (two) times daily.   ELIQUIS  5 MG TABS tablet TAKE 1 TABLET TWICE A DAY   ezetimibe  (ZETIA ) 10 MG tablet Take 1 tablet (10 mg total) by mouth daily.   gabapentin  (NEURONTIN ) 100 MG capsule TAKE 1 TO 2 CAPSULES THREE TIMES A DAY   isosorbide  mononitrate (IMDUR ) 30 MG 24 hr tablet TAKE 1 TABLET DAILY   JUBLIA 10 % SOLN SMARTSIG:Applicator Topical Daily   losartan  (COZAAR ) 100 MG tablet TAKE 1 TABLET DAILY (MUST KEEP UPCOMING APPOINTMENT IN JULY 2024 WITH DR WADDELL BEFORE ANYMORE REFILLS, FINAL ATTEMPT)   metFORMIN  (GLUCOPHAGE ) 850 MG tablet Take 1 tablet (850 mg total) by mouth daily with breakfast.   nitroGLYCERIN  (NITROSTAT ) 0.4 MG SL tablet DISSOLVE 1 TABLET UNDER THE TONGUE EVERY 5 MINUTES AS NEEDED FOR CHEST PAIN (MAXIMUM 3 DOSES)   nystatin  (MYCOSTATIN /NYSTOP ) powder Apply 1 Application topically 2 (two) times daily. Use until resolved and then 2 extra days.   Omega-3 Fatty Acids (FISH OIL) 1000 MG CAPS Take 1,000 mg by mouth 2 (two) times daily. [employer]   pantoprazole  (PROTONIX ) 40 MG tablet Take 1 tablet (40 mg total) by mouth daily.   spironolactone  (ALDACTONE ) 25 MG tablet Take 1 tablet (25 mg total) by mouth daily.    Allergies:   Fenofibrate , Animator, and Statins   Social History   Socioeconomic History   Marital status: Married    Spouse name: Not on file   Number of children: 2   Years of education: HS   Highest education level: Not on file  Occupational History   Occupation: Retired  Tobacco Use   Smoking status: Never    Smokeless tobacco: Never  Vaping Use   Vaping status: Never Used  Substance and Sexual Activity   Alcohol use: No    Alcohol/week: 0.0 standard drinks of alcohol   Drug use: No   Sexual activity: Not on file  Other Topics Concern   Not on file  Social History Narrative   Retired, remarried 1988   From [place], [place]   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   Right-handed.   2 cups caffeine daily  Social Drivers of Corporate Investment Banker Strain: Low Risk  (02/12/2024)   Overall Financial Resource Strain (CARDIA)    Difficulty of Paying Living Expenses: Not hard at all  Food Insecurity: No Food Insecurity (02/12/2024)   Hunger Vital Sign    Worried About Running Out of Food in the Last Year: Never true    Ran Out of Food in the Last Year: Never true  Transportation Needs: No Transportation Needs (02/12/2024)   PRAPARE - Administrator, Civil Service (Medical): No    Lack of Transportation (Non-Medical): No  Physical Activity: Sufficiently Active (02/12/2024)   Exercise Vital Sign    Days of Exercise per Week: 5 days    Minutes of Exercise per Session: 60 min  Stress: No Stress Concern Present (02/12/2024)   [employer] of Occupational Health - Occupational Stress Questionnaire    Feeling of Stress: Not at all  Social Connections: Socially Integrated (02/12/2024)   Social Connection and Isolation Panel    Frequency of Communication with Friends and Family: More than three times a week    Frequency of Social Gatherings with Friends and Family: Three times a week    Attends Religious Services: More than 4 times per year    Active Member of Clubs or Organizations: Yes    Attends Engineer, Structural: More than 4 times per year    Marital Status: Married     Family History:  The patient's family history includes CAD in his brother; Colon cancer in his maternal grandmother; Diabetes in his mother; Heart  disease in his father; Parkinsonism in his maternal grandfather; Prostate cancer in his maternal uncle; Stroke in his mother and paternal grandfather.  ROS:   12-point review of systems is negative unless otherwise noted in the HPI.   EKGs/Labs/Other Studies Reviewed:    Studies reviewed were summarized above. The additional studies were reviewed today:  Zio patch 03/2023: Normal sinus rhythm Patient had a min HR of 32 bpm, max HR of 135 bpm, and avg HR of 61 bpm.    Atrial Fibrillation occurred (36% burden), ranging from 32-135 bpm (avg of 68 bpm), the longest lasting 2 days 19 hours with an avg  rate of 64 bpm.    22 Pauses occurred, the longest lasting 3.9 secs (15 bpm).  Pauses noted overnight 1 to 3 AM   Isolated SVEs were occasional (1.3%, 15634), SVE Couplets were rare (<1.0%, 2244), and SVE Triplets were rare (<1.0%, 496).    Isolated VEs were rare (<1.0%, 522), VE Couplets were rare (<1.0%, 9), and VE Triplets were rare (<1.0%, 3).    Ventricular Bigeminy was present.    No patient triggered events recorded __________   [facility] 05/16/2022:   Mid LAD lesion is 100% stenosed.   Ost Cx to Prox Cx lesion is 80% stenosed.   Mid Cx lesion is 90% stenosed.   Prox RCA lesion is 100% stenosed.   1st Mrg lesion is 100% stenosed.   Mid Cx to Dist Cx lesion is 90% stenosed.   Origin lesion is 30% stenosed.   Insertion lesion is 99% stenosed.   2nd Diag lesion is 50% stenosed.   Dist LAD lesion is 60% stenosed.   A drug-eluting stent was successfully placed using a STENT ONYX FRONTIER 2.25X15.   Post intervention, there is a 10% residual stenosis.   SVG graft was visualized by angiography and is normal in caliber.   SVG graft was visualized by angiography and  is normal in caliber.   LIMA graft was visualized by angiography and is normal in caliber.   The graft exhibits no disease.   The graft exhibits minimal luminal irregularities.   The graft exhibits no disease.   The left  ventricular systolic function is normal.   LV end diastolic pressure is mildly elevated.   The left ventricular ejection fraction is 55-65% by visual estimate.   1.  Severe underlying three-vessel coronary artery disease with patent grafts including LIMA to LAD, SVG to OM 2/OM 3, SVG to RCA and SVG to second diagonal.  The SVG to second diagonal has severe anastomosis stenosis which is the likely culprit for angina. 2.  Normal LV systolic function and mildly elevated left ventricular end-diastolic pressure. 3.  Successful angioplasty and drug-eluting stent placement to the anastomosis of SVG to second diagonal extending into the native vessel.  Difficult procedure due to very fibrotic lesion with difficulty delivering a stent.  A guide extension had to be used.   Recommendations: We will use dual antiplatelet therapy for now.  Once Eliquis  is resumed, aspirin  can be discontinued.  Eliquis  has been on hold due to interaction with an antibiotic that he takes for his prostate infection. The patient can likely be discharged home tomorrow.   EKG:  EKG is ordered today.  The EKG ordered today demonstrates A-fib with V-paced rhythm, 63 bpm  Recent Labs: 12/19/2023: Magnesium  2.1 03/25/2024: TSH 2.93 04/18/2024: ALT 15; BUN 12; Creatinine, Ser 1.03; Hemoglobin 14.7; Platelets 159; Potassium 4.2; Sodium 135  Recent Lipid Panel    Component Value Date/Time   CHOL 126 03/25/2024 0816   TRIG 261.0 (H) 03/25/2024 0816   HDL 33.50 (L) 03/25/2024 0816   CHOLHDL 4 03/25/2024 0816   VLDL 52.2 (H) 03/25/2024 0816   LDLCALC 40 03/25/2024 0816   LDLDIRECT 67.0 02/10/2023 0815    PHYSICAL EXAM:    VS:  BP 120/60 (BP Location: Left Arm, Patient Position: Sitting, Cuff Size: Normal)   Pulse 63 Comment: 63 oximeter  Ht 5' 7 (1.702 m)   Wt 157 lb (71.2 kg)   SpO2 98%   BMI 24.59 kg/m   BMI: Body mass index is 24.59 kg/m.  Physical Exam Vitals reviewed.  Constitutional:      Appearance: He is  well-developed.  HENT:     Head: Normocephalic and atraumatic.  Eyes:     General:        Right eye: No discharge.        Left eye: No discharge.  Cardiovascular:     Rate and Rhythm: Normal rate. Rhythm irregularly irregular.     Pulses:          Posterior tibial pulses are 2+ on the right side and 2+ on the left side.     Heart sounds: Normal heart sounds, S1 normal and S2 normal. Heart sounds not distant. No midsystolic click and no opening snap. No murmur heard.    No friction rub.  Pulmonary:     Effort: Pulmonary effort is normal. No respiratory distress.     Breath sounds: Normal breath sounds. No decreased breath sounds, wheezing, rhonchi or rales.  Musculoskeletal:     Cervical back: Normal range of motion.     Right lower leg: No edema.     Left lower leg: No edema.  Skin:    General: Skin is warm and dry.     Nails: There is no clubbing.  Neurological:     Mental  Status: He is alert and oriented to person, place, and time.  Psychiatric:        Speech: Speech normal.        Behavior: Behavior normal.        Thought Content: Thought content normal.        Judgment: Judgment normal.     Wt Readings from Last 3 Encounters:  06/17/24 157 lb (71.2 kg)  04/26/24 153 lb (69.4 kg)  04/18/24 155 lb (70.3 kg)     ASSESSMENT & PLAN:   CAD status post CABG status post subsequent PCI without angina: He is doing well and without symptoms concerning for angina or cardiac decompensation.  On apixaban  in lieu of aspirin  to minimize bleeding risk given underlying A-fib.  Continue aggressive risk factor modification and secondary prevention including amlodipine  5 mg, Imdur  30 mg, and ezetimibe  10 mg.  No indication for further ischemic testing at this time.  Persistent A-fib: Currently in A-fib with controlled ventricular response and is asymptomatic.  Most recent device interrogation from 04/2024 showed a 45% A-fib burden.  EP is aware with recommendation to continue dofetilide  per  their last note in 02/2024.  CHA2DS2-VASc at least 7 (HTN, age x 2, DM, CVA x 2, vascular disease).  He remains on apixaban  5 mg twice daily and does not meet reduced dosing criteria.  No falls or symptoms concerning for bleeding.  Check CBC, BMP, and magnesium .  Maintain potassium and magnesium  at goal 4.0 and 2.0, respectively.  Management per EP.  Sinus node dysfunction: Status post PPM.  Management per EP.  HTN: Blood pressure is well-controlled in the office today.  He remains on amlodipine  5 mg, Imdur  30 mg, losartan  100 mg, and spironolactone  25 mg.  HLD with statin intolerance: LDL 40 in 03/2024 with normal AST/ALT in 04/2024.  Remains on ezetimibe  10 mg.      Disposition: F/u with Dr. Gollan or an APP in 12 months, and EP as directed, will transition EP care to [facility] office given retirement of Dr. Waddell.    Medication Adjustments/Labs and Tests Ordered: Current medicines are reviewed at length with the patient today.  Concerns regarding medicines are outlined above. Medication changes, Labs and Tests ordered today are summarized above and listed in the Patient Instructions accessible in Encounters.   Signed, Bernardino Bring, PA-C 06/17/2024 12:50 PM     [facility] [place] [address] [place], [place] [address] [phone]

## 2024-06-18 ENCOUNTER — Ambulatory Visit (INDEPENDENT_AMBULATORY_CARE_PROVIDER_SITE_OTHER): Admitting: Psychology

## 2024-06-18 ENCOUNTER — Ambulatory Visit: Payer: Self-pay

## 2024-06-18 DIAGNOSIS — F0670 Mild neurocognitive disorder due to known physiological condition without behavioral disturbance: Secondary | ICD-10-CM

## 2024-06-18 DIAGNOSIS — F068 Other specified mental disorders due to known physiological condition: Secondary | ICD-10-CM | POA: Diagnosis not present

## 2024-06-18 DIAGNOSIS — R4189 Other symptoms and signs involving cognitive functions and awareness: Secondary | ICD-10-CM

## 2024-06-18 NOTE — Progress Notes (Signed)
 NEUROPSYCHOLOGICAL EVALUATION [place]. [facility]  [place] Department of Neurology  Date of Evaluation: 06/18/2024  REASON FOR REFERRAL   [name] [name] is an 83 year old, right-handed, White male with 12 years of formal education. He was referred for neuropsychological evaluation by Camie Sevin, PA-C, to assess current neurocognitive functioning, document potential cognitive deficits, and assist with treatment planning. This is his first neuropsychological evaluation.  SUMMARY OF RESULTS   Premorbid cognitive abilities are estimated to be in the low average range based on word reading and sociodemographic factors. Relative to this baseline estimate, current performance was broadly within age-related expectations, with the exception of some variability observed across language and learning/memory tasks.  Regarding language, he demonstrated reduced verbal fluency, while confrontation naming remained intact. Regarding learning/memory, he performed adequately on the immediate recall of a word list but had difficulty recalling the words after a longer delay. With recognition cues, he recalled more of the words; however, his overall recognition score remained low due to several semantically related false-positive errors. He also demonstrated difficulty with the immediate and delayed recall of visual shapes, although recognition of this information was intact. In contrast, he performed within expectations when encoding, recalling, and recognizing short stories.  Remaining measures of working memory, processing speed, executive functioning, and visuospatial abilities were intact.  On self-report questionnaires, he did not endorse significant symptoms of depression or anxiety.  DIAGNOSTIC IMPRESSION   Results of the current evaluation indicated weaknesses in verbal fluency and memory (primarily encoding and retrieval), with otherwise intact performance. 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   The underlying etiology remains unclear at this time, particularly given the nonspecific pattern of weaknesses observed on testing. Although recent brain imaging shows mild atrophy compared to his prior scan, his overall profile is not strongly suggestive of a neurodegenerative condition at this time. He does have multiple vascular risk factors, and prior imaging has noted mild cerebrovascular changes; however, it is encouraging that current neuroimaging does not show evidence of advanced cerebrovascular disease. He was recently diagnosed with sleep apnea and has begun CPAP treatment, so it will be important to monitor whether continued use leads to improvement in cognitive functioning. Reduced sensory functioning (hearing and vision) may also be a contributing factor.   Of note, the trace brain bleeds observed following the event last February (described below) are unlikely to account for the current test findings. The bleeds were minimal, resolved without intervention, and occurred over a year ago.  ICD-10 Codes: F06.70 Mild neurocognitive disorder  RECOMMENDATIONS   In consultation with your doctor, schedule cognitive reevaluation on an as-needed basis to assess for cognitive decline and update treatment recommendations. Reevaluation should occur during a period of medical and affective stability.  Prioritize physical health through diet, exercise, and sleep. Regular physical activity supports cardiovascular health, improves mood, and helps preserve mobility and independence. Aim for at least 150 minutes of moderate aerobic exercise per week (e.g., brisk walking, swimming, gardening). A brain-healthy diet such as the Mediterranean or MIND diet is Neumann in fruits, vegetables, whole grains, healthy fats, and lean proteins, and has been associated with reduced risk of cognitive  decline. Additionally, getting adequate, quality sleep and managing chronic conditions with the help of healthcare providers are essential components of healthy aging.  Continue to stay socially and mentally engaged. Maintaining strong social connections and regularly stimulating your brain can help protect against cognitive decline. This includes staying  connected with friends and family, volunteering, or participating in community groups. Mentally engaging activities--such as reading, doing puzzles, playing strategy games, or learning a new language or musical instrument--promote brain plasticity. If you are interested in activities to support cognitive engagement, this site offers a variety of apps and games organized by difficulty level:  https://www.barrowneuro.org/get-to-know-barrow/centers-programs/neurorehabilitation-center/neuro-rehab-apps-and-games/  Consider implementing compensatory strategies to maximize independence and maintain daily functioning. Examples include:  Adhere to routine. Compensatory strategies work best when they are used consistently. Use a planner, calendar, or white board that has the schedule and important events for the day clearly listed to reference and cross off when tasks are complete.  Ask for written information, especially if it is new or unfamiliar (e.g., information provided at a doctor's appointment).  Create an organized environment. Keep items that can be easily misplaced in a sensible location and get into the habit of always returning the items to those places. Pay attention and reduce distractions. Make a point of focusing attention on information you want to remember. One-on-one interaction is more likely to facilitate attention and minimize distraction. Make eye contact and repeat the information out loud after you hear it. Reduce interruptions or distractions especially when attempting to learn new information.  Create associations. When learning  something new, think about and understand the information. Explain it in your own words or try to associate it with something you already know. Take notes to help remember important details. Evaluate goals and plan accordingly. When confronted by many different tasks, begin by making a list that prioritizes each task and estimates the time it will take to complete. Break down complicated tasks into smaller, more manageable steps. Focus on one task at a time and complete each task before starting another. Avoid multitasking.  DISPOSITION   Patient will follow up with the referring provider, Ms. Wertman. No follow-up neuropsychological testing was scheduled at this time. Please feel free to refer the patient for repeated evaluation if he shows a significant change in neurocognitive status. He will be provided verbal feedback in approximately one week regarding the findings and impression during this visit.  The remainder of the report includes the details of the patient's background and a table of results from the current evaluation, which support the summary and recommendations described above.  BACKGROUND   History of Presenting Illness: The following information was obtained from a review of medical records and an interview with the patient. Briefly, the patient presented to the ED in February 2024 for mental status change. 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 It was unclear whether a fall or loss of consciousness occurred. Head CT at that time was unremarkable. He was seen the following day by Camie Sevin, PA-C, at [facility], who questioned a neurologic versus cardiac etiology; further workup was recommended but not completed. He returned to the ED the next day with headache, blurry vision, and left neck pain. MRI/MRA showed trace bilateral subdural  collections (likely hematomas) and trace subarachnoid material consistent with a small subarachnoid hemorrhage. Neurosurgery recommended conservative management without surgical intervention. He was later seen by Ms. Wertman on 04/26/2024 for memory concerns. MMSE = 29/30. He was referred for neuropsychological evaluation accordingly.  Cognitive Functioning: During today's appointment, the patient reported experiencing cognitive concerns over the past several years following four COVID vaccinations. Specific issues he mentioned included  short-term memory difficulties, such as misplacing items and sometimes forgetting details of conversations. He described situations where he would return from the store and leave groceries in the car or put them on the counter and later forget them there. He acknowledged that his attention has consistently been a problem. He also frequently experiences word-finding difficulties. He described himself as having always had strong navigation skills and noted that he can still navigate places without getting lost, though he now relies more on preparation beforehand. He plans and organizes well using a calendar, but stated that without it, he would likely forget many things. Although his wife has noticed these cognitive difficulties, he feels they are not obvious to others. He wonders if he is able to mask these problems in everyday interactions because he has always been very social and has the "gift of gab.  Physical Functioning: Patient denied difficulties with sleep initiation and maintenance. He has recently started using a CPAP machine, which he has been adherent to nightly. Appetite is stable, with no reported changes in sense of smell or taste. Vision is somewhat variable; depending on the glasses he is wearing, he may sometimes experience double vision. 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 He reported balance difficulties but denied any recent falls.  Additionally, he has recently noticed a very subtle tremor in his hands bilaterally.  Emotional Functioning: Patient described himself as a happy person throughout his life and characterized himself as a "lover not a fighter." He denied suicidal ideation. He keeps busy throughout the day, usually performing tasks around the farm.  Neuroimaging: MRI of the brain (04/23/2024) documented mild diffuse cerebral atrophy that has developed since the prior study in February 2024.  Other Relevant Medical History: Remarkable for paroxysmal atrial fibrillation, hypertension, angina, coronary artery disease, hyperlipidemia, type 2 diabetes, and glaucoma. Although the medical record lists a history of stroke, the patient denies any known prior stroke. Additionally, no history of CNS infection or seizure was reported. Please refer to the medical record for a more comprehensive problem list.  Current Medications: Per record, acyclovir , amlodipine , cod liver oil, dofetilide , Eliquis , ezetimibe , gabapentin , isosorbide  mononitrate, Jublia, losartan , metformin , nitroglycerin , nystatin , omega-3 fatty acids, pantoprazole , Refresh Tears, and spironolactone .   Functional Status: Patient independently performs all basic and instrumental activities of daily living. He continues to drive without any reported accidents, traffic violations, or navigational difficulties. He manages his portion of the finances without issues. He has noticed occasional difficulty remembering to take his medications and is actively implementing strategies to improve adherence. Typically, he is able to catch missed doses by the end of the day, though there are still occasional lapses. He denied any difficulties operating household appliances or tools.   Family Neurological History: Unremarkable.  Psychiatric History: History of depression, anxiety, prior mental health treatment, suicidal ideation, hallucinations, and psychiatric hospitalizations was  not reported.  Substance Use History: Patient denied current use of alcohol, nicotine, marijuana, and other illicit substances.  Social and Developmental History: Patient was born in [place], [place]. History of perinatal complications and developmental delays was not reported. He is married, with one son and one daughter. He lives with his wife and noted that their daughter also lives on the same farm, but in her own house.  Educational and Occupational History: No history of childhood learning disability, special education services, or grade retention was reported. 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 He explained that math was his best subject, while reading comprehension was his  biggest challenge. He graduated high school and completed a few college courses. 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  BEHAVIORAL OBSERVATIONS   Patient arrived on time and was unaccompanied. He ambulated independently and without gait disturbance. He was alert and fully oriented. He was appropriately groomed and dressed for the setting. No significant motor abnormalities were observed. Vision (with glasses) and hearing (with hearing aids) were adequate for testing purposes. Speech was of normal rate, prosody, and volume. No conversational word-finding difficulties, paraphasic errors, or dysarthria were observed. Comprehension was conversationally intact. Thought processes were linear, logical, and coherent. Thought content was organized and devoid of delusions. Insight appeared appropriate. Affect was even and congruent with euthymic mood. He was cooperative and appeared to give adequate effort during testing, including on embedded measures of performance validity. Results are thought to accurately reflect his cognitive functioning at this  time.  NEUROPSYCHOLOGICAL TESTING RESULTS   Tests Administered: Animal Naming Test; Brief Visuospatial Memory Test-Revised (BVMT-R) - Form 1; [place]  Verbal Learning Test Third Edition (CVLT3) - Brief Form; Controlled Oral Word Association Test (COWAT): FAS; Delis-Kaplan Executive Function System (D-KEFS) - Subtest(s): Color-Word Interference Test; Geriatric Anxiety Scale-10 Item (GAS-10); Geriatric Depression Scale Short Form (GDS-SF); Neuropsychological Assessment Battery (NAB) Form 1 - Subtest(s): Naming, Judgement, Visual Discrimination; Test of Premorbid Functioning (TOPF); Trail Making Test (TMT); Wechsler Adult Intelligence Scale Fifth Edition (WAIS-5) - Subtest(s): Similarities, Clinical Cytogeneticist, Digits Forward, Digit Sequencing, Coding, Symbol Search, Digits Backward; and Wechsler Memory Scale Fourth Edition (WMS-IV) - Subtest(s): Logical Memory (LM).  Test results are provided in the table below. Whenever possible, the patient's scores were compared against age-, sex-, and education-corrected normative samples. Interpretive descriptions are based on the AACN consensus conference statement on uniform labeling (Guilmette et al., 2020).  PREMORBID FUNCTIONING RAW  RANGE  TOPF 20 StdS=84 Low Average  ATTENTION & WORKING MEMORY RAW  RANGE  WAIS-5 Digits Forward -- ss=8 Average  WAIS-5 Digits Backward -- ss=11 Average  WAIS-5 Digit Sequencing -- ss=8 Average  PROCESSING SPEED RAW  RANGE  Trails A 34''0e T=54 Average  WAIS-5 Coding  -- ss=11 Average  WAIS-5 Symbol Search -- ss=11 Average  DKEFS CWIT Color Naming 40''2e ss=8 Average  DKEFS CWIT Word Reading 29''0e ss=9 Average  EXECUTIVE FUNCTION RAW  RANGE  Trails B 127''1e T=47 Average  WAIS-5 Similarities -- ss=7 Low Average  COWAT Letter Fluency 7+7+6 T=35 Below Average  DKEFS CWIT Inhibition 75''0e ss=12 High Average  DKEFS CWIT Inhibition/Switching 69''4e ss=14 High Average  NAB Judgement -- T=72 Exceptionally High  LANGUAGE RAW   RANGE  COWAT Letter Fluency 7+7+6 T=35 Below Average  Animal Naming Test 10 T=33 Below Average  NAB Naming Test 30/31 T=62 WNL  VISUOSPATIAL RAW  RANGE  WAIS-5 Block Design -- ss=9 Average  NAB Visual Discrimination 17/18 T=64 Above Average  BVMT-R Copy Trial 12/12 -- WNL  VERBAL LEARNING & MEMORY RAW  RANGE  CVLT3 Total 1-4 (5+5+6+7)/36 StdS=100 Average  CVLT3 SDFR  5/9 ss=7 Low Average  CVLT3 LDFR  3/9 ss=5 Below Average  CVLT3 LDCR  4/9 ss=4 Below Average  CVLT3 Recognition Hits 6 ss=7 Low Average  CVLT3 Recognition False+ 4 ss=4 Below Average  CVLT3 Discriminability -- ss=3 Exceptionally Low  CVLT3 Intrusions 3 ss=8 Average  CVLT3 Repetitions 0 ss=12 High Average  CVLT3  Forced Choice 9/9 -- WNL  WMS-IV LM-I  (5+10+6)/53 ss=8 Average  WMS-IV LM-II  (3+2)/39 ss=6 Low Average  WMS-IV LM Recognition  (5+9)/23 10-16%ile Low Average  VISUAL LEARNING & MEMORY RAW  RANGE  BVMT-R Trial 1 2/12 T=40 Low Average  BVMT-R Trial 2 1/12 T=30 Below Average  BVMT-R Trial 3 4/12 T=38 Low Average  BVMT-R Total Recall 7/36 T=34 Below Average  BVMT-R Delayed Recall 2/12 T=33 Below Average  BVMT-R Recognition Hits 5 -- --  BVMT-R Recognition False Alarms 0 -- --  BVMT-R Recognition Discrimination Index 5 T=48 Average  *From Powell et al. (2022) -- -- --  QUESTIONNAIRES RAW  RANGE  GDS-SF 1 -- Minimal  GAS-10 2 -- Minimal  *Note: ss = scaled score; StdS = standard score; T = t-score; C/S = corrected raw score; WNL = within normal limits; BNL= below normal limits; D/C = discontinued. Scores from skewed distributions are typically interpreted as WNL (>=16th %ile) or BNL (<16th %ile).   INFORMED CONSENT   Patient was provided with a verbal description of the nature and purpose of the neuropsychological evaluation. Also reviewed were the foreseeable risks and/or discomforts and benefits of the procedure, limits of confidentiality, and mandatory reporting requirements of this provider. Patient was  given the opportunity to have their questions answered. Oral consent to participate was provided by the patient.   This report was prepared as part of a clinical evaluation and is not intended for forensic use.  SERVICE   This evaluation was conducted by Renda Beckwith, Psy.D. In addition to time spent directly with the patient, total professional time (120 minutes) includes record review, integration of relevant medical history, test selection, interpretation of findings, and report preparation. A technician, Evalene Pizza, B.S., provided testing and scoring assistance (164 minutes).  Psychiatric Diagnostic Evaluation Services (Professional): [phone] x 1 Neuropsychological Testing Evaluation Services (Professional): [phone] x 1 Neuropsychological Testing Evaluation Services (Professional): [phone] x 1 Neuropsychological Test Administration and Scoring Radiographer, Therapeutic): [phone] x 1 Neuropsychological Test Administration and Scoring (Technician): [phone] x 4  This report was generated using voice recognition software. While this document has been carefully reviewed, transcription errors may be present. I apologize in advance for any inconvenience. Please contact me if further clarification is needed.            Renda Beckwith, Psy.D.             Neuropsychologist

## 2024-06-18 NOTE — Progress Notes (Signed)
   Psychometrician Note   Cognitive testing was administered to [name] F Imel Sr. by Evalene Pizza, B.S. (psychometrist) under the supervision of Dr. Renda Beckwith, Psy.D., licensed psychologist on 06/18/2024. Mr. Grahn did not appear overtly distressed by the testing session per behavioral observation or responses across self-report questionnaires. Rest breaks were offered.   The battery of tests administered was selected by Dr. Renda Beckwith, Psy.D. with consideration to Mr. Wolfinger current level of functioning, the nature of his symptoms, emotional and behavioral responses during interview, level of literacy, observed level of motivation/effort, and the nature of the referral question. This battery was communicated to the psychometrist. Communication between Dr. Renda Beckwith, Psy.D. and the psychometrist was ongoing throughout the evaluation and Dr. Renda Beckwith, Psy.D. was immediately accessible at all times. Dr. Renda Beckwith, Psy.D. provided supervision to the psychometrist on the date of this service to the extent necessary to assure the quality of all services provided.    Norleen JULIANNA Kurt Sr. will return within approximately 1-2 weeks for an interactive feedback session with Dr. Beckwith at which time his test performances, clinical impressions, and treatment recommendations will be reviewed in detail. Mr. Wojnarowski understands he can contact our office should he require our assistance before this time.  A total of 164 minutes of billable time were spent face-to-face with Mr. Barthelemy by the psychometrist. This includes both test administration and scoring time. Billing for these services is reflected in the clinical report generated by Dr. Renda Beckwith, Psy.D.  This note reflects time spent with the psychometrician and does not include test scores or any clinical interpretations made by Dr. Beckwith. The full report will follow in a separate note.

## 2024-06-19 DIAGNOSIS — R0902 Hypoxemia: Secondary | ICD-10-CM | POA: Diagnosis not present

## 2024-06-19 DIAGNOSIS — G4730 Sleep apnea, unspecified: Secondary | ICD-10-CM | POA: Diagnosis not present

## 2024-06-19 MED ORDER — METFORMIN HCL 850 MG PO TABS: 850.0000 mg | ORAL_TABLET | Freq: Two times a day (BID) | ORAL | 3 refills | Status: AC

## 2024-06-19 NOTE — Telephone Encounter (Signed)
 Sent. Thanks.

## 2024-06-19 NOTE — Addendum Note (Signed)
 Addended by: CLEATUS ARLYSS RAMAN on: 06/19/2024 02:23 PM   Modules accepted: Orders

## 2024-06-19 NOTE — Addendum Note (Signed)
 Addended by: SEBASTIAN KNEE on: 06/19/2024 12:10 PM   Modules accepted: Orders

## 2024-06-19 NOTE — Telephone Encounter (Signed)
 Called patient and let him know that after reviewing Dr. Elfredia notes that his metformin  was decreased at is appointment on 04/02/24 due to diarrhea.  Patient became frustrated and said that he has been taking it twice a day all this time with no issues.  He reports that the diarrhea was not from the medication.  Would like new prescription sent in for the twice day dose.  I've pended the new dose for your signature if you agree.  I did not d/c the current dose.

## 2024-06-20 ENCOUNTER — Encounter: Payer: Self-pay | Admitting: Cardiology

## 2024-06-20 ENCOUNTER — Ambulatory Visit: Attending: Cardiology | Admitting: Cardiology

## 2024-06-20 VITALS — BP 114/50 | HR 61 | Ht 67.0 in | Wt 156.4 lb

## 2024-06-20 DIAGNOSIS — I10 Essential (primary) hypertension: Secondary | ICD-10-CM

## 2024-06-20 DIAGNOSIS — G4733 Obstructive sleep apnea (adult) (pediatric): Secondary | ICD-10-CM

## 2024-06-20 NOTE — Addendum Note (Signed)
 Addended by: JANIT GENI CROME on: 06/20/2024 03:24 PM   Modules accepted: Orders

## 2024-06-20 NOTE — Patient Instructions (Addendum)
 Medication Instructions:  Your physician recommends that you continue on your current medications as directed. Please refer to the Current Medication list given to you today.   *If you need a refill on your cardiac medications before your next appointment, please call your pharmacy*  Lab Work: None.  If you have labs (blood work) drawn today and your tests are completely normal, you will receive your results only by: MyChart Message (if you have MyChart) OR A paper copy in the mail If you have any lab test that is abnormal or we need to change your treatment, we will call you to review the results.  Testing/Procedures: None.  Follow-Up: At [facility], you and your health needs are our priority.  As part of our continuing mission to provide you with exceptional heart care, our providers are all part of one team.  This team includes your primary Cardiologist (physician) and Advanced Practice Providers or APPs (Physician Assistants and Nurse Practitioners) who all work together to provide you with the care you need, when you need it.  Your next appointment:   2 month(s)  Provider:   Dr. Wilbert Bihari, MD   Other Instructions You have been referred to Dr. Vaughan Ricker, ENT. Someone from his office will call you to set up an appointment.  [address], [place], [place] [address] Phone: [phone]

## 2024-06-20 NOTE — Progress Notes (Signed)
 Sleep Medicine Note    Date:  06/20/2024   ID:  [name] JULIANNA Sorrel Sr., DOB 1941/02/09, MRN [id number]  PCP:  Cleatus Arlyss RAMAN, MD  Cardiologist: Evalene Lunger, MD   Chief Complaint  Patient presents with   Sleep Apnea   Hypertension    History of Present Illness:  [name] [name]. is a 83 y.o. male  with a history of CAD, diabetes with diabetic neuropathy, diastolic dysfunction, GERD, embolic CVA, hypertension, hyperlipidemia and PAF.  He also has a history of obstructive sleep apnea followed by Dr. Burnard.  Due to Dr. Joesphine retirement he is now referred to me to establish sleep care.  When he saw Dr. Burnard 05/10/2023 he complained of excessive daytime sleepiness and would have to take a nap after lunch for several hours.  He also admitted to snoring.  Sleep study evaluation was recommended with an in-lab split-night sleep study.  Split-night sleep study 06/05/2023 which revealed moderate to severe obstructive sleep apnea with an overall AHI of 29.1/h and during REM sleep 36.3/h.  Supine sleep AHI was 84.5/h.  O2 saturation nadir was 86%.  He underwent CPAP titration to ongoing events was switched to BiPAP titration and ultimately started on a ResMed BiPAP at 18/14 cm H2O with a medium Fisher [employer]kel Simplus fullface mask.  He is now here for further follow-up.  He is  doing well with his PAP device and thinks that he has gotten used to it.  He did not like the [employer] full face mask that he used during the titration.  He still uses a full face mask but sounds like it is a ResMed mask as the hose come off at the top of his head. He feels the pressure is adequate.  Since going on PAP he feels more rested in the am and has less significant daytime sleepiness.  He denies any significant mouth or nasal dryness but has had a lot of problems with nasal congestion.  He does not think that he snores. An Epworth Sleepiness Scale score was calculated the office today and this endorsed at 3 arguing  against residual daytime sleepiness. Patient denies any episodes of bruxism, restless legs, No gagging hallucinations or cataplectic events.    Past Medical History:  Diagnosis Date   Anxiety    Back pain    Coronary artery disease 08/21/2013   a. 08/2013 s/p CABG x 6; b. 05/2022 PCI: LAD 193m, 60d, D2 50, LCX 80ost, 24m, 8m/d, OM1 100, RCA 100p, VG->OM2->OM3 nl, VG->D2 30ost, 99d @ Diag insertion (2.25x15 Onyx Frontier DES), VG->RCA nl, LIMA->LAD nl. EF 55-65%.   Diabetes mellitus    Diabetic neuropathy (HCC)    Diastolic dysfunction    a. 02/2017 Echo: EF 55-60%, no rwma, Gr1 DD, mildly dil Ao Root/RV. + PFO.   Diverticulosis    Diverticulosis    Embolic stroke (HCC)    a. 04/2017 MRI in setting of freq falls: small subacute infarction of bilat centrum semi-ovale consistent w/ embolic event-->Eliquis .   Erectile dysfunction    Family history of anesthesia complication    ' they cant wake my brother very easy   Fatty liver    Flank pain    GERD (gastroesophageal reflux disease)    History of bronchitis    History of chicken pox    HSV infection    ocular symptoms and oral lesions   Hypercholesterolemia    Hypertension    Kidney stones  Osteoarthritis    PAF (paroxysmal atrial fibrillation) (HCC)    a. Had post-op AF 08/2013; b. 09/2015 Holter: PAC's no AF; c. 05/2017 Noted to be in AFib-->Eliquis  (CHA2DS2VASc = 7); d. 08/2017 s/p DCCV-->ERAF-->Tikosyn  added; e. 08/2020 Zio: 4% Afib burden.   PFO (patent foramen ovale)    a. 02/2017 Echo: + PFO.   Sinus node dysfunction (HCC)    a. 08/2020 Zio: Predominantly sinus bradycardia at 53 (35-211).  20 SVT runs.  1 run of nonsustained VT x4 beats.  4% A-fib burden.  16 pauses lasting up to 3.6 seconds.  Intermittent junctional rhythm.  Patient declined pacemaker.    Past Surgical History:  Procedure Laterality Date   CARDIAC CATHETERIZATION  08/21/2013   DR VERLIN   CARDIOVERSION N/A 08/24/2017   Procedure: CARDIOVERSION;  Surgeon:  Perla Evalene PARAS, MD;  Location: [facility] ORS;  Service: Cardiovascular;  Laterality: N/A;   CHOLECYSTECTOMY  08/08/1981   COLONOSCOPY WITH PROPOFOL  N/A 01/04/2023   Procedure: COLONOSCOPY WITH PROPOFOL ;  Surgeon: Therisa Bi, MD;  Location: [facility] ENDOSCOPY;  Service: Gastroenterology;  Laterality: N/A;   CORONARY ARTERY BYPASS GRAFT N/A 08/23/2013   Procedure: CORONARY ARTERY BYPASS GRAFTING (CABG);  Surgeon: Dallas KATHEE Jude, MD;  Location: [facility] OR;  Service: Open Heart Surgery;  Laterality: N/A;  CABG times six utilizing the left internal mammary artery and the right greater saphenous vein harvested endoscopically   CORONARY STENT INTERVENTION N/A 05/16/2022   Procedure: CORONARY STENT INTERVENTION;  Surgeon: Darron Deatrice LABOR, MD;  Location: [facility] INVASIVE CV LAB;  Service: Cardiovascular;  Laterality: N/A;   ENDOVEIN HARVEST OF GREATER SAPHENOUS VEIN Right 08/23/2013   Procedure: ENDOVEIN HARVEST OF GREATER SAPHENOUS VEIN;  Surgeon: Dallas KATHEE Jude, MD;  Location: [facility] OR;  Service: Open Heart Surgery;  Laterality: Right;   ESOPHAGOGASTRODUODENOSCOPY (EGD) WITH PROPOFOL  N/A 01/04/2023   Procedure: ESOPHAGOGASTRODUODENOSCOPY (EGD) WITH PROPOFOL ;  Surgeon: Therisa Bi, MD;  Location: [facility] ENDOSCOPY;  Service: Gastroenterology;  Laterality: N/A;   HERNIA REPAIR     X3   INTRAOPERATIVE TRANSESOPHAGEAL ECHOCARDIOGRAM N/A 08/23/2013   Procedure: INTRAOPERATIVE TRANSESOPHAGEAL ECHOCARDIOGRAM;  Surgeon: Dallas KATHEE Jude, MD;  Location[facility] OR;  Service: Open Heart Surgery;  Laterality: N/A;   LEFT HEART CATH AND CORS/GRAFTS ANGIOGRAPHY N/A 05/16/2022   Procedure: LEFT HEART CATH AND CORS/GRAFTS ANGIOGRAPHY;  Surgeon: Darron Deatrice LABOR, MD;  Location: [facility] INVASIVE CV LAB;  Service: Cardiovascular;  Laterality: N/A;   LEFT HEART CATHETERIZATION WITH CORONARY ANGIOGRAM N/A 08/21/2013   Procedure: LEFT HEART CATHETERIZATION WITH CORONARY ANGIOGRAM;  Surgeon: Lonni JONETTA Verlin, MD;  Location[facility] LAB;   Service: Cardiovascular;  Laterality: N/A;   PACEMAKER IMPLANT N/A 07/03/2023   Procedure: PACEMAKER IMPLANT;  Surgeon: Waddell Danelle ORN, MD;  Location: [facility] INVASIVE CV LAB;  Service: Cardiovascular;  Laterality: N/A;    Current Medications: Current Meds  Medication Sig   acyclovir  (ZOVIRAX ) 800 MG tablet TAKE 1 TABLET TWICE A DAY   amLODipine  (NORVASC ) 10 MG tablet Take 0.5 tablets (5 mg total) by mouth daily.   carboxymethylcellulose (REFRESH TEARS) 0.5 % SOLN Place 1 drop into both eyes 2 (two) times daily.   COD LIVER OIL PO Take 10 mcg by mouth 2 (two) times daily.   dofetilide  (TIKOSYN ) 500 MCG capsule Take 1 capsule (500 mcg total) by mouth 2 (two) times daily.   ELIQUIS  5 MG TABS tablet TAKE 1 TABLET TWICE A DAY   ezetimibe  (ZETIA ) 10 MG tablet Take 1 tablet (10 mg total) by mouth daily.  gabapentin  (NEURONTIN ) 100 MG capsule TAKE 1 TO 2 CAPSULES THREE TIMES A DAY   isosorbide  mononitrate (IMDUR ) 30 MG 24 hr tablet TAKE 1 TABLET DAILY   JUBLIA 10 % SOLN SMARTSIG:Applicator Topical Daily   losartan  (COZAAR ) 100 MG tablet TAKE 1 TABLET DAILY (MUST KEEP UPCOMING APPOINTMENT IN JULY 2024 WITH DR WADDELL BEFORE ANYMORE REFILLS, FINAL ATTEMPT)   metFORMIN  (GLUCOPHAGE ) 850 MG tablet Take 1 tablet (850 mg total) by mouth 2 (two) times daily with a meal.   nitroGLYCERIN  (NITROSTAT ) 0.4 MG SL tablet DISSOLVE 1 TABLET UNDER THE TONGUE EVERY 5 MINUTES AS NEEDED FOR CHEST PAIN (MAXIMUM 3 DOSES)   nystatin  (MYCOSTATIN /NYSTOP ) powder Apply 1 Application topically 2 (two) times daily. Use until resolved and then 2 extra days.   Omega-3 Fatty Acids (FISH OIL) 1000 MG CAPS Take 1,000 mg by mouth 2 (two) times daily. Wild English As A Second Language Teacher   pantoprazole  (PROTONIX ) 40 MG tablet Take 1 tablet (40 mg total) by mouth daily.   spironolactone  (ALDACTONE ) 25 MG tablet Take 1 tablet (25 mg total) by mouth daily.    Allergies:   Fenofibrate , Animator, and Statins   Social History   Socioeconomic History    Marital status: Married    Spouse name: Not on file   Number of children: 2   Years of education: HS   Highest education level: Not on file  Occupational History   Occupation: Retired  Tobacco Use   Smoking status: Never   Smokeless tobacco: Never  Vaping Use   Vaping status: Never Used  Substance and Sexual Activity   Alcohol use: No    Alcohol/week: 0.0 standard drinks of alcohol   Drug use: No   Sexual activity: Not on file  Other Topics Concern   Not on file  Social History Narrative   Retired, remarried 1988   From [place], [place]   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   Right-handed.   2 cups caffeine daily   Social Drivers of Health   Financial Resource Strain: Low Risk  (02/12/2024)   Overall Financial Resource Strain (CARDIA)    Difficulty of Paying Living Expenses: Not hard at all  Food Insecurity: No Food Insecurity (02/12/2024)   Hunger Vital Sign    Worried About Running Out of Food in the Last Year: Never true    Ran Out of Food in the Last Year: Never true  Transportation Needs: No Transportation Needs (02/12/2024)   PRAPARE - Administrator, Civil Service (Medical): No    Lack of Transportation (Non-Medical): No  Physical Activity: Sufficiently Active (02/12/2024)   Exercise Vital Sign    Days of Exercise per Week: 5 days    Minutes of Exercise per Session: 60 min  Stress: No Stress Concern Present (02/12/2024)   [employer] of Occupational Health - Occupational Stress Questionnaire    Feeling of Stress: Not at all  Social Connections: Socially Integrated (02/12/2024)   Social Connection and Isolation Panel    Frequency of Communication with Friends and Family: More than three times a week    Frequency of Social Gatherings with Friends and Family: Three times a week    Attends Religious Services: More than 4 times per year    Active Member of Clubs or Organizations: Yes    Attends Tax Inspector Meetings: More than 4 times per year    Marital Status: Married     Family History:  The patient's family history includes CAD in his brother; Colon cancer in his maternal grandmother; Diabetes in his mother; Heart disease in his father; Parkinsonism in his maternal grandfather; Prostate cancer in his maternal uncle; Stroke in his mother and paternal grandfather.   ROS:   Please see the history of present illness.    ROS All other systems reviewed and are negative.      No data to display             PHYSICAL EXAM:   VS:  BP (!) 114/50   Pulse 61   Ht 5' 7 (1.702 m)   Wt 156 lb 6.4 oz (70.9 kg)   SpO2 95%   BMI 24.50 kg/m    GEN: Well nourished, well developed, in no acute distress  HEENT: normal  Neck: no JVD, carotid bruits, or masses Cardiac: RRR; no murmurs, rubs, or gallops,no edema.  Intact distal pulses bilaterally.  Respiratory:  clear to auscultation bilaterally, normal work of breathing GI: soft, nontender, nondistended, + BS MS: no deformity or atrophy  Skin: warm and dry, no rash Neuro:  Alert and Oriented x 3, Strength and sensation are intact Psych: euthymic mood, full affect  Wt Readings from Last 3 Encounters:  06/20/24 156 lb 6.4 oz (70.9 kg)  06/17/24 157 lb (71.2 kg)  04/26/24 153 lb (69.4 kg)      Studies/Labs Reviewed:   NSPG and CPAP titration and PAP download  Recent Labs: 12/19/2023: Magnesium  2.1 03/25/2024: TSH 2.93 04/18/2024: ALT 15; BUN 12; Creatinine, Ser 1.03; Hemoglobin 14.7; Platelets 159; Potassium 4.2; Sodium 135     ASSESSMENT:    1. OSA (obstructive sleep apnea)   2. Essential hypertension      PLAN:  In order of problems listed above:  OSA - The patient is tolerating PAP therapy well without any problems. The PAP download performed by his DME was personally reviewed and interpreted by me today and showed an AHI of 11.2 /hr on BiPAP at 18/14 cm H2O with 40% compliance in using more than 4 hours nightly.   The patient has been using and benefiting from PAP use and will continue to benefit from therapy.  - AHI is still too high with a very high mask leak which I think is likely the etiology of his elevated AHI -I am going to change him to auto BIPAP with IPAP max 20cm H2O and EPAP min 8cm H2O with PS 4 cm H2O - I am going to send him back to the DME to get fitted for a mask - Repeat download in 4 weeks - His download says compliance was only 40% but he says that he has not missed any night but the download showed he only used it 12/30 days.  I have asked him to take his device to the DME to have them look at it to find out why his device does not record every night. Encouraged him to be more compliant with his device otherwise he will be at risk for cardiovascular effects of obstructive sleep apnea including CVA, arrhythmias, CHF, CAD, diabetes mellitus -he has had a lot of problems with nasal drainage and congestion so I will refer him to ENT.   Hypertension - BP controlled on exam today - Continue amlodipine  5 mg daily, Imdur  30 mg daily, losartan  100 mg daily and spironolactone  25 mg daily with as needed refills   Time Spent: 20 minutes total time of encounter, including 15 minutes spent in face-to-face patient  care on the date of this encounter. This time includes coordination of care and counseling regarding above mentioned problem list. Remainder of non-face-to-face time involved reviewing chart documents/testing relevant to the patient encounter and documentation in the medical record. I have independently reviewed documentation from referring provider  Medication Adjustments/Labs and Tests Ordered: Current medicines are reviewed at length with the patient today.  Concerns regarding medicines are outlined above.  Medication changes, Labs and Tests ordered today are listed in the Patient Instructions below.  There are no Patient Instructions on file for this visit.  Follow-up in 2  months  Signed, Wilbert Bihari, MD  06/20/2024 3:08 PM    [facility] [address] [place], [place], [place]  [address] Phone: [phone]; Fax: [phone]

## 2024-06-21 ENCOUNTER — Telehealth: Payer: Self-pay | Admitting: *Deleted

## 2024-06-21 DIAGNOSIS — G4733 Obstructive sleep apnea (adult) (pediatric): Secondary | ICD-10-CM

## 2024-06-21 DIAGNOSIS — R0683 Snoring: Secondary | ICD-10-CM

## 2024-06-21 DIAGNOSIS — I25810 Atherosclerosis of coronary artery bypass graft(s) without angina pectoris: Secondary | ICD-10-CM

## 2024-06-21 DIAGNOSIS — I10 Essential (primary) hypertension: Secondary | ICD-10-CM

## 2024-06-21 DIAGNOSIS — I4819 Other persistent atrial fibrillation: Secondary | ICD-10-CM

## 2024-06-21 NOTE — Telephone Encounter (Signed)
-----   Message from Wilbert Bihari sent at 06/20/2024  3:17 PM EST ----- change him to auto BIPAP with IPAP max 20cm H2O and EPAP min 8cm H2O with PS 4 cm H2O

## 2024-06-21 NOTE — Telephone Encounter (Signed)
-----   Message from [name] [name] sent at 06/20/2024  3:15 PM EST ----- Please get him a DME visit for mask leak ASAP and repeat download in 4 weeks

## 2024-06-21 NOTE — Telephone Encounter (Signed)
Order placed to [facility] via community message. 

## 2024-06-21 NOTE — Addendum Note (Signed)
 Addended by: JOSHUA DALTON MATSU on: 06/21/2024 03:35 PM   Modules accepted: Orders, Level of Service

## 2024-06-21 NOTE — Telephone Encounter (Signed)
 This encounter was created in error - please disregard.

## 2024-06-24 ENCOUNTER — Encounter: Payer: Self-pay | Admitting: *Deleted

## 2024-06-24 DIAGNOSIS — G4733 Obstructive sleep apnea (adult) (pediatric): Secondary | ICD-10-CM

## 2024-06-24 DIAGNOSIS — I10 Essential (primary) hypertension: Secondary | ICD-10-CM

## 2024-06-24 DIAGNOSIS — R0683 Snoring: Secondary | ICD-10-CM

## 2024-06-24 DIAGNOSIS — I25810 Atherosclerosis of coronary artery bypass graft(s) without angina pectoris: Secondary | ICD-10-CM

## 2024-06-24 NOTE — Telephone Encounter (Signed)
 This encounter was created in error - please disregard.

## 2024-06-24 NOTE — Telephone Encounter (Signed)
-----   Message from Wilbert Bihari sent at 06/20/2024  3:17 PM EST ----- change him to auto BIPAP with IPAP max 20cm H2O and EPAP min 8cm H2O with PS 4 cm H2O

## 2024-06-27 DIAGNOSIS — N3943 Post-void dribbling: Secondary | ICD-10-CM | POA: Diagnosis not present

## 2024-07-01 ENCOUNTER — Ambulatory Visit: Admitting: Psychology

## 2024-07-01 DIAGNOSIS — G3184 Mild cognitive impairment, so stated: Secondary | ICD-10-CM | POA: Diagnosis not present

## 2024-07-01 DIAGNOSIS — F0670 Mild neurocognitive disorder due to known physiological condition without behavioral disturbance: Secondary | ICD-10-CM

## 2024-07-01 NOTE — Progress Notes (Signed)
   NEUROPSYCHOLOGY FEEDBACK SESSION [place]  [facility] of Neurology  Date of Feedback Session: 07/01/2024  REASON FOR REFERRAL   [name] [name] is an 83 year old, right-handed, White male with 12 years of formal education. He was referred for neuropsychological evaluation by Camie Sevin, PA-C, to assess current neurocognitive functioning, document potential cognitive deficits, and assist with treatment planning. This is his first neuropsychological evaluation.   FEEDBACK   Patient completed a comprehensive neuropsychological evaluation on 06/18/2024. Please refer to that encounter for the full report and recommendations. Briefly, results indicated weaknesses in verbal fluency and memory (primarily encoding and retrieval), with otherwise intact performance. 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 The underlying etiology remains unclear at this time, particularly given the nonspecific pattern of weaknesses observed on testing. His current profile is not strongly suggestive of a neurodegenerative condition. Although he has vascular risk factors and prior imaging showed mild cerebrovascular changes, current neuroimaging is reassuring and shows no advanced disease. Other contributing factors may include untreated sleep apnea (until recently) and reduced hearing and vision. History of trace brain bleeds are unlikely to explain test results, as they were minimal, resolved without intervention, and occurred over a year ago.  Today, the patient was unaccompanied. He was provided verbal feedback regarding the findings and impression during this visit, and his questions were answered. A copy of the report was provided at the conclusion of the visit.  DISPOSITION   Patient will follow up with the referring provider, Ms. Wertman. No follow-up neuropsychological testing  was scheduled at this time. Please feel free to refer the patient for repeated evaluation if he shows a significant change in neurocognitive status.  SERVICE   This feedback session was conducted by Renda Beckwith, Psy.D. [place] 03867 (35 minutes) was billed for Dr. Beckwith' time spent in preparing, conducting, and documenting the current feedback session.  This report was generated using voice recognition software. While this document has been carefully reviewed, transcription errors may be present. I apologize in advance for any inconvenience. Please contact me if further clarification is needed.

## 2024-07-02 ENCOUNTER — Ambulatory Visit: Payer: Medicare Other

## 2024-07-02 DIAGNOSIS — I4819 Other persistent atrial fibrillation: Secondary | ICD-10-CM

## 2024-07-03 ENCOUNTER — Telehealth: Payer: Self-pay

## 2024-07-03 LAB — CUP PACEART REMOTE DEVICE CHECK
Battery Remaining Longevity: 94 mo
Battery Remaining Percentage: 92 %
Battery Voltage: 2.99 V
Brady Statistic AP VP Percent: 25 %
Brady Statistic AP VS Percent: 50 %
Brady Statistic AS VP Percent: 4.7 %
Brady Statistic AS VS Percent: 20 %
Brady Statistic RA Percent Paced: 36 %
Brady Statistic RV Percent Paced: 59 %
Date Time Interrogation Session: 20251125040014
Implantable Lead Connection Status: 753985
Implantable Lead Connection Status: 753985
Implantable Lead Implant Date: 20241125
Implantable Lead Implant Date: 20241125
Implantable Lead Location: 753859
Implantable Lead Location: 753860
Implantable Pulse Generator Implant Date: 20241125
Lead Channel Impedance Value: 430 Ohm
Lead Channel Impedance Value: 440 Ohm
Lead Channel Pacing Threshold Amplitude: 0.5 V
Lead Channel Pacing Threshold Amplitude: 0.75 V
Lead Channel Pacing Threshold Pulse Width: 0.5 ms
Lead Channel Pacing Threshold Pulse Width: 0.5 ms
Lead Channel Sensing Intrinsic Amplitude: 12 mV
Lead Channel Sensing Intrinsic Amplitude: 4.8 mV
Lead Channel Setting Pacing Amplitude: 2.5 V
Lead Channel Setting Pacing Amplitude: 2.5 V
Lead Channel Setting Pacing Pulse Width: 0.5 ms
Lead Channel Setting Sensing Sensitivity: 2 mV
Pulse Gen Model: 2272
Pulse Gen Serial Number: 8231918

## 2024-07-03 NOTE — Telephone Encounter (Signed)
 Pt returned call - please advise

## 2024-07-03 NOTE — Telephone Encounter (Signed)
-----   Message from Nurse Geni E sent at 06/20/2024  3:35 PM EST ----- Regarding: 2 mo f/u check

## 2024-07-03 NOTE — Telephone Encounter (Signed)
 Call to patient to find out whether he had been able to get in with Dr. Carlie yet, no answer. No updated DPR on file for our practice. Left VM asking recipient to call [place] at our office #.

## 2024-07-03 NOTE — Telephone Encounter (Signed)
 S/w the patient and asked if he had been seen by Dr Carlie.  He reports that he did not even remember anything about a Dr Carlie. He does report that he cleaned his machine and also shaved his face and the mask fits much better now. He said that he thinks it is working over 90% of the time.   He said he would like to keep doing the cpap for now and would like to discuss Dr Carlie at his next visit with Dr Shlomo.

## 2024-07-05 NOTE — Progress Notes (Signed)
 Remote PPM Transmission

## 2024-07-08 ENCOUNTER — Ambulatory Visit: Payer: Self-pay | Admitting: *Deleted

## 2024-07-10 ENCOUNTER — Ambulatory Visit: Payer: Self-pay | Admitting: Internal Medicine

## 2024-07-11 ENCOUNTER — Other Ambulatory Visit: Payer: Self-pay

## 2024-07-11 DIAGNOSIS — J3489 Other specified disorders of nose and nasal sinuses: Secondary | ICD-10-CM

## 2024-07-11 DIAGNOSIS — J310 Chronic rhinitis: Secondary | ICD-10-CM

## 2024-07-12 DIAGNOSIS — Z9989 Dependence on other enabling machines and devices: Secondary | ICD-10-CM | POA: Diagnosis not present

## 2024-07-12 DIAGNOSIS — J323 Chronic sphenoidal sinusitis: Secondary | ICD-10-CM | POA: Diagnosis not present

## 2024-07-12 DIAGNOSIS — G4733 Obstructive sleep apnea (adult) (pediatric): Secondary | ICD-10-CM | POA: Diagnosis not present

## 2024-07-12 DIAGNOSIS — J300 Vasomotor rhinitis: Secondary | ICD-10-CM | POA: Diagnosis not present

## 2024-07-30 ENCOUNTER — Other Ambulatory Visit: Payer: Self-pay | Admitting: Physician Assistant

## 2024-09-03 ENCOUNTER — Ambulatory Visit: Admitting: Physician Assistant

## 2024-09-05 ENCOUNTER — Other Ambulatory Visit: Payer: Self-pay

## 2024-09-05 ENCOUNTER — Emergency Department
Admission: EM | Admit: 2024-09-05 | Discharge: 2024-09-05 | Disposition: A | Discharge status: Home / Self Care | Attending: Emergency Medicine | Admitting: Emergency Medicine

## 2024-09-05 ENCOUNTER — Emergency Department

## 2024-09-05 DIAGNOSIS — I480 Paroxysmal atrial fibrillation: Secondary | ICD-10-CM | POA: Insufficient documentation

## 2024-09-05 DIAGNOSIS — S065X0A Traumatic subdural hemorrhage without loss of consciousness, initial encounter: Secondary | ICD-10-CM | POA: Insufficient documentation

## 2024-09-05 DIAGNOSIS — S2231XA Fracture of one rib, right side, initial encounter for closed fracture: Secondary | ICD-10-CM | POA: Insufficient documentation

## 2024-09-05 DIAGNOSIS — I2510 Atherosclerotic heart disease of native coronary artery without angina pectoris: Secondary | ICD-10-CM | POA: Diagnosis not present

## 2024-09-05 DIAGNOSIS — W01198A Fall on same level from slipping, tripping and stumbling with subsequent striking against other object, initial encounter: Secondary | ICD-10-CM | POA: Insufficient documentation

## 2024-09-05 DIAGNOSIS — S0990XA Unspecified injury of head, initial encounter: Secondary | ICD-10-CM | POA: Diagnosis present

## 2024-09-05 DIAGNOSIS — E119 Type 2 diabetes mellitus without complications: Secondary | ICD-10-CM | POA: Diagnosis not present

## 2024-09-05 DIAGNOSIS — I110 Hypertensive heart disease with heart failure: Secondary | ICD-10-CM | POA: Diagnosis not present

## 2024-09-05 DIAGNOSIS — Z7901 Long term (current) use of anticoagulants: Secondary | ICD-10-CM | POA: Diagnosis not present

## 2024-09-05 DIAGNOSIS — I5020 Unspecified systolic (congestive) heart failure: Secondary | ICD-10-CM | POA: Diagnosis not present

## 2024-09-05 DIAGNOSIS — S065XAA Traumatic subdural hemorrhage with loss of consciousness status unknown, initial encounter: Secondary | ICD-10-CM

## 2024-09-05 LAB — COMPREHENSIVE METABOLIC PANEL WITH GFR
ALT: 19 U/L (ref 0–44)
AST: 25 U/L (ref 15–41)
Albumin: 4.5 g/dL (ref 3.5–5.0)
Alkaline Phosphatase: 74 U/L (ref 38–126)
Anion gap: 11 (ref 5–15)
BUN: 19 mg/dL (ref 8–23)
CO2: 26 mmol/L (ref 22–32)
Calcium: 10.1 mg/dL (ref 8.9–10.3)
Chloride: 104 mmol/L (ref 98–111)
Creatinine, Ser: 1.1 mg/dL (ref 0.61–1.24)
GFR, Estimated: >60 mL/min
Glucose, Bld: 124 mg/dL — ABNORMAL HIGH (ref 70–99)
Potassium: 4.6 mmol/L (ref 3.5–5.1)
Sodium: 140 mmol/L (ref 135–145)
Total Bilirubin: 1.4 mg/dL — ABNORMAL HIGH (ref 0.0–1.2)
Total Protein: 7.3 g/dL (ref 6.5–8.1)

## 2024-09-05 LAB — CBC
HCT: 45.1 % (ref 39.0–52.0)
Hemoglobin: 14.9 g/dL (ref 13.0–17.0)
MCH: 31.6 pg (ref 26.0–34.0)
MCHC: 33 g/dL (ref 30.0–36.0)
MCV: 95.8 fL (ref 80.0–100.0)
Platelets: 175 10*3/uL (ref 150–400)
RBC: 4.71 MIL/uL (ref 4.22–5.81)
RDW: 12.5 % (ref 11.5–15.5)
WBC: 6.7 10*3/uL (ref 4.0–10.5)
nRBC: 0 % (ref 0.0–0.2)

## 2024-09-05 LAB — TROPONIN T, HIGH SENSITIVITY: Troponin T High Sensitivity: 13 ng/L (ref 0–19)

## 2024-09-05 MED ORDER — OXYCODONE HCL 5 MG PO TABS
5.0000 mg | ORAL_TABLET | Freq: Once | ORAL | Status: AC
  Administered 2024-09-05: 5 mg via ORAL
  Filled 2024-09-05: qty 1

## 2024-09-05 MED ORDER — OXYCODONE-ACETAMINOPHEN 5-325 MG PO TABS: 1.0000 | ORAL_TABLET | Freq: Four times a day (QID) | ORAL | 0 refills | Status: AC | PRN

## 2024-09-05 MED ORDER — ACETAMINOPHEN 500 MG PO TABS
1000.0000 mg | ORAL_TABLET | Freq: Once | ORAL | Status: AC
  Administered 2024-09-05: 1000 mg via ORAL
  Filled 2024-09-05: qty 2

## 2024-09-05 MED ORDER — LIDOCAINE 5 % EX PTCH
1.0000 | MEDICATED_PATCH | Freq: Once | CUTANEOUS | Status: DC
  Administered 2024-09-05: 1 via TRANSDERMAL
  Filled 2024-09-05: qty 1

## 2024-09-05 NOTE — ED Triage Notes (Addendum)
 Pt comes with cp that started few minute ago. Pt states he was on ice and then blackout. Pt states his chest and back after fall. Pt states top of head pain. Pt is on thinners. Pt did hit head and loose consciousness. Pt states little bit of dizziness.

## 2024-09-05 NOTE — ED Provider Notes (Signed)
----------------------------------------- °  7:17 PM on 09/05/2024 ----------------------------------------- Patient's repeat CT scan has resulted showing an unchanged 4 mm subdural hematoma.  Given no change patient is safe for discharge home with outpatient neurosurgery follow-up.  No Eliquis  x 1 week.  I have conveyed this to the patient he is agreeable to this plan and ready to go home.   Dorothyann Drivers, MD 09/05/24 [phone]

## 2024-09-05 NOTE — ED Notes (Signed)
 Pt given incentive spirometer and instruction on how to use. Pt able to correctly demonstrate proper use of incentive spirometer.

## 2024-09-05 NOTE — ED Provider Notes (Signed)
 "  [facility] Provider Note    Event Date/Time   First MD Initiated Contact with Patient 09/05/24 1200     (approximate)   History   Chest Pain   HPI  [name] MCNELLIS Sr. is a 84 y.o. male past medical tree significant for CAD, diabetes, diabetic neuropathy, HFrEF, GERD, embolic CVA, hypertension, hyperlipidemia, paroxysmal atrial fibrillation on Eliquis , OSA, presents to the emergency department with chest pain.  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  Denies any preceding symptoms or any chest pain prior to the fall.  States that he fell backwards hitting his head and had chest pain following the fall.  States that his pain is worse when he takes a deep breath or when he moves.  Also complaining of some pain to the back of his head.  No significant shortness of breath.  States he only has mild pain.  Denies nausea, vomiting or diaphoresis.  No change in vision, slurring of speech, trouble swallowing or extremity numbness or weakness.  No urinary or bowel incontinence.  No dysuria.  Last took his Eliquis  this morning at 630.  On chart review patient was last seen by cardiology on my evaluation 06/20/2024 and is followed by Dr. Gollan.  Most recent cardiac catheterization was 05/16/2022 that showed severe three-vessel coronary artery disease with patent grafts including LIMA to LAD SVG with severe anastomosis stenosis.  Normal LV systolic function with mildly elevated end-diastolic pressure.  Successful angioplasty of drug-eluting stent to the SVG and second diagonal.  Difficult procedure given fibrotic lesion     Physical Exam   Triage Vital Signs: ED Triage Vitals  Encounter Vitals Group     BP 09/05/24 1144 (!) 159/69     Girls Systolic BP Percentile --      Girls Diastolic BP Percentile --      Boys Systolic BP Percentile --      Boys Diastolic BP Percentile --      Pulse Rate 09/05/24 1144 71     Resp 09/05/24 1144 18      Temp 09/05/24 1144 97.7 F (36.5 C)     Temp src --      SpO2 09/05/24 1144 96 %     Weight 09/05/24 1143 150 lb (68 kg)     Height 09/05/24 1143 5' 7 (1.702 m)     Head Circumference --      Peak Flow --      Pain Score 09/05/24 1143 8     Pain Loc --      Pain Education --      Exclude from Growth Chart --     Most recent vital signs: Vitals:   09/05/24 1300 09/05/24 1430  BP: 134/74 (!) 130/59  Pulse: (!) 59 72  Resp: 20 19  Temp:    SpO2:      Physical Exam Constitutional:      Appearance: He is well-developed.  HENT:     Head: Atraumatic.  Eyes:     Extraocular Movements: Extraocular movements intact.     Conjunctiva/sclera: Conjunctivae normal.     Pupils: Pupils are equal, round, and reactive to light.  Neck:     Comments: No midline ttp  Cardiovascular:     Rate and Rhythm: Regular rhythm.  Pulmonary:     Effort: No respiratory distress.  Chest:     Chest wall: Tenderness present.     Comments: Right  sided ttp  Abdominal:     Palpations: Abdomen is soft.     Tenderness: There is no abdominal tenderness.  Musculoskeletal:        General: Normal range of motion.     Cervical back: Normal range of motion.     Left lower leg: No tenderness.     Comments: No midline thoracic or lumbar ttp  Skin:    General: Skin is warm.     Capillary Refill: Capillary refill takes less than 2 seconds.  Neurological:     General: No focal deficit present.     Mental Status: He is alert. Mental status is at baseline.     GCS: GCS eye subscore is 4. GCS verbal subscore is 5. GCS motor subscore is 6.     Cranial Nerves: Cranial nerves 2-12 are intact.     Motor: Motor function is intact.     IMPRESSION / MDM / ASSESSMENT AND PLAN / ED COURSE  I reviewed the triage vital signs and the nursing notes.  Differential diagnosis including intracranial hemorrhage, subdural hematoma, epidural hematoma, cervical fracture, rib fracture, lung contusion, ACS,  dysrhythmia  EKG  I, Clotilda Punter, the attending physician, personally viewed and interpreted this ECG.  Paced rhythm, nonspecific ST changes.  QRS 82, QTc 422  No tachycardic or bradycardic dysrhythmias while on cardiac telemetry.  Unable to interrogate pacemaker currently  RADIOLOGY I independently reviewed imaging, my interpretation of imaging: CT scan of the head with small subdural hematoma.  CT scan of the chest with nondisplaced right rib fracture  LABS (all labs ordered are listed, but only abnormal results are displayed) Labs interpreted as -    Labs Reviewed  COMPREHENSIVE METABOLIC PANEL WITH GFR - Abnormal; Notable for the following components:      Result Value   Glucose, Bld 124 (*)    Total Bilirubin 1.4 (*)    All other components within normal limits  CBC  CBG MONITORING, ED  TROPONIN T, HIGH SENSITIVITY     MDM  Discussed with Dr. Katrina with neurosurgery who recommended 6-hour repeat of the CT scan of the head, progression of the bleed we will reengage with neurosurgery.  Otherwise can discharge home and hold Eliquis  for 1 week. BIG 1  Chest x-ray concern for 1 rib fracture, given Tylenol  and incentive spirometer, Lidoderm  patch placed.  No significant leukocytosis.  Creatinine at baseline no significant electrolyte abnormality  Plan for 6-hour repeat CT scan.  Discussed holding Eliquis .  Discussed multimodal pain control for rib fracture.  Care transferred to incoming provider with repeat 6-hour CT scan pending.     PROCEDURES:  Critical Care performed: yes  .Critical Care  Performed by: Punter Clotilda, MD Authorized by: Punter Clotilda, MD   Critical care provider statement:    Critical care time (minutes):  30   Critical care time was exclusive of:  Separately billable procedures and treating other patients   Critical care was necessary to treat or prevent imminent or life-threatening deterioration of the following conditions:  CNS  failure or compromise   Critical care was time spent personally by me on the following activities:  Development of treatment plan with patient or surrogate, discussions with consultants, evaluation of patient's response to treatment, examination of patient, ordering and review of laboratory studies, ordering and review of radiographic studies, ordering and performing treatments and interventions, pulse oximetry, re-evaluation of patient's condition and review of old charts   Patient's presentation is most consistent with acute  presentation with potential threat to life or bodily function.   MEDICATIONS ORDERED IN ED: Medications  lidocaine  (LIDODERM ) 5 % 1 patch (1 patch Transdermal Patch Applied 09/05/24 1515)  acetaminophen  (TYLENOL ) tablet 1,000 mg (1,000 mg Oral Given 09/05/24 1328)    FINAL CLINICAL IMPRESSION(S) / ED DIAGNOSES   Final diagnoses:  Closed fracture of one rib of right side, initial encounter  Subdural hematoma (HCC)     Rx / DC Orders   ED Discharge Orders          Ordered    oxyCODONE -acetaminophen  (PERCOCET) 5-325 MG tablet  Every 6 hours PRN        09/05/24 1522             Note:  This document was prepared using Dragon voice recognition software and may include unintentional dictation errors.   Suzanne Kirsch, MD 09/05/24 1527  "

## 2024-09-05 NOTE — Discharge Instructions (Addendum)
 You were seen in the emergency department following a fall.  You were diagnosed with a small subdural hematoma with some bleeding around your brain.  Hold your Eliquis  for 1 week.  Return to the emergency department if you have any severe headache, new weakness or numbness or any new or worsening symptoms.  Follow-up with your primary care physician.  You can restart your Eliquis  after 1 week.  You had a CT scan that showed 1 rib fracture on the right side.  Schedule 1 g of Tylenol  (2 extra strength) every 6 hours.  You can use over-the-counter Lidoderm  patch, apply for 12 hours and then remove and replace.  If you are still having severe pain you can take 1 oxycodone .  Make sure you use your incentive spirometer throughout the day to avoid pneumonia.  If you develop any symptoms of pneumonia return immediately.  You were given a prescription for narcotic pain medications.  Take only if in severe pain.  These are very addictive medications.  These medications can make you constipated.  If you need to take more than 1-2 doses, start a stool softner.  If you become constipated, take 1 capfull of MiraLAX , can repeat untill having regular bowel movements.  Keep this medication out of reach of any children. You cannot drive/work while taking this medication.  Please call the number provided for neurosurgery to arrange a follow-up appointment in approximately 1 to 2 weeks for recheck/reevaluation.  Return to the emergency department for any weakness or numbness in the arm or leg confusion slurred speech or any other symptom personally concerning to yourself.

## 2024-09-07 ENCOUNTER — Other Ambulatory Visit: Payer: Self-pay

## 2024-09-07 ENCOUNTER — Emergency Department

## 2024-09-07 ENCOUNTER — Emergency Department
Admission: EM | Admit: 2024-09-07 | Discharge: 2024-09-07 | Disposition: A | Discharge status: Home / Self Care | Attending: Emergency Medicine | Admitting: Emergency Medicine

## 2024-09-07 DIAGNOSIS — S065XAA Traumatic subdural hemorrhage with loss of consciousness status unknown, initial encounter: Secondary | ICD-10-CM

## 2024-09-07 DIAGNOSIS — I4891 Unspecified atrial fibrillation: Secondary | ICD-10-CM | POA: Insufficient documentation

## 2024-09-07 DIAGNOSIS — R42 Dizziness and giddiness: Secondary | ICD-10-CM

## 2024-09-07 DIAGNOSIS — E119 Type 2 diabetes mellitus without complications: Secondary | ICD-10-CM | POA: Insufficient documentation

## 2024-09-07 DIAGNOSIS — R109 Unspecified abdominal pain: Secondary | ICD-10-CM | POA: Insufficient documentation

## 2024-09-07 DIAGNOSIS — I2510 Atherosclerotic heart disease of native coronary artery without angina pectoris: Secondary | ICD-10-CM | POA: Insufficient documentation

## 2024-09-07 DIAGNOSIS — Z7901 Long term (current) use of anticoagulants: Secondary | ICD-10-CM | POA: Insufficient documentation

## 2024-09-07 DIAGNOSIS — I509 Heart failure, unspecified: Secondary | ICD-10-CM | POA: Insufficient documentation

## 2024-09-07 DIAGNOSIS — I110 Hypertensive heart disease with heart failure: Secondary | ICD-10-CM | POA: Insufficient documentation

## 2024-09-07 DIAGNOSIS — S065X0A Traumatic subdural hemorrhage without loss of consciousness, initial encounter: Secondary | ICD-10-CM | POA: Insufficient documentation

## 2024-09-07 DIAGNOSIS — W19XXXA Unspecified fall, initial encounter: Secondary | ICD-10-CM | POA: Insufficient documentation

## 2024-09-07 LAB — ETHANOL: Alcohol, Ethyl (B): <15 mg/dL

## 2024-09-07 LAB — COMPREHENSIVE METABOLIC PANEL WITH GFR
ALT: 13 U/L (ref 0–44)
AST: 16 U/L (ref 15–41)
Albumin: 4.4 g/dL (ref 3.5–5.0)
Alkaline Phosphatase: 76 U/L (ref 38–126)
Anion gap: 12 (ref 5–15)
BUN: 17 mg/dL (ref 8–23)
CO2: 25 mmol/L (ref 22–32)
Calcium: 9.7 mg/dL (ref 8.9–10.3)
Chloride: 101 mmol/L (ref 98–111)
Creatinine, Ser: 0.89 mg/dL (ref 0.61–1.24)
GFR, Estimated: >60 mL/min
Glucose, Bld: 159 mg/dL — ABNORMAL HIGH (ref 70–99)
Potassium: 4.5 mmol/L (ref 3.5–5.1)
Sodium: 138 mmol/L (ref 135–145)
Total Bilirubin: 1.2 mg/dL (ref 0.0–1.2)
Total Protein: 7.3 g/dL (ref 6.5–8.1)

## 2024-09-07 LAB — APTT: aPTT: 28 s (ref 24–36)

## 2024-09-07 LAB — DIFFERENTIAL
Abs Immature Granulocytes: 0.03 10*3/uL (ref 0.00–0.07)
Basophils Absolute: 0 10*3/uL (ref 0.0–0.1)
Basophils Relative: 0 %
Eosinophils Absolute: 0.2 10*3/uL (ref 0.0–0.5)
Eosinophils Relative: 3 %
Immature Granulocytes: 0 %
Lymphocytes Relative: 18 %
Lymphs Abs: 1.7 10*3/uL (ref 0.7–4.0)
Monocytes Absolute: 0.6 10*3/uL (ref 0.1–1.0)
Monocytes Relative: 6 %
Neutro Abs: 7 10*3/uL (ref 1.7–7.7)
Neutrophils Relative %: 73 %

## 2024-09-07 LAB — CBC
HCT: 44.9 % (ref 39.0–52.0)
Hemoglobin: 15.2 g/dL (ref 13.0–17.0)
MCH: 32.3 pg (ref 26.0–34.0)
MCHC: 33.9 g/dL (ref 30.0–36.0)
MCV: 95.5 fL (ref 80.0–100.0)
Platelets: 183 10*3/uL (ref 150–400)
RBC: 4.7 MIL/uL (ref 4.22–5.81)
RDW: 12.4 % (ref 11.5–15.5)
WBC: 9.6 10*3/uL (ref 4.0–10.5)
nRBC: 0 % (ref 0.0–0.2)

## 2024-09-07 LAB — PROTIME-INR
INR: 1 (ref 0.8–1.2)
Prothrombin Time: 14.2 s (ref 11.4–15.2)

## 2024-09-07 LAB — CBG MONITORING, ED: Glucose-Capillary: 164 mg/dL — ABNORMAL HIGH (ref 70–99)

## 2024-09-07 MED ORDER — SODIUM CHLORIDE 0.9% FLUSH: 3.0000 mL | Freq: Once | INTRAVENOUS | Status: DC

## 2024-09-07 NOTE — ED Notes (Addendum)
 Pt states that he only gets a headache if something is wrong, and c/o of gradually increasing headache that started today with a water  pooling feeling on top of head. Pt is AOX4, NAD noted, PERRLA noted 3mm bilaterally, Pt denies visual changes. Speech is clear, no facial asymmetry or dysarthria noted. Pt has a splint/wrap around chest that he is using due to rib fracture. Pt has a pacemaker.

## 2024-09-07 NOTE — ED Notes (Signed)
 Pt given ice per request. MD bedside.

## 2024-09-07 NOTE — ED Provider Notes (Signed)
 "  [facility] Provider Note    Event Date/Time   First MD Initiated Contact with Patient 09/07/24 1746     (approximate)   History   Chief Complaint Dizziness   HPI  [name] [name]. is a 84 y.o. male with past medical history of hypertension, diabetes, CAD, CHF, atrial fibrillation on Eliquis , and stroke who presents to the ED complaining of dizziness.  Patient reports that 2 days ago he had a fall and was seen in the ED, diagnosed with small subdural hematoma at that time.  He was told to stop his Eliquis  but was discharged home after repeat CT imaging was stable.  Since getting up today, he states that he has had increasing left-sided headache as well as a buzzing beats sensation across his head and face.  He also describes feeling unsteady on his feet, but denies any vision changes or speech changes.  He has not had any numbness or weakness in his extremities.     Physical Exam   Triage Vital Signs: ED Triage Vitals  Encounter Vitals Group     BP 09/07/24 1647 (!) 156/73     Girls Systolic BP Percentile --      Girls Diastolic BP Percentile --      Boys Systolic BP Percentile --      Boys Diastolic BP Percentile --      Pulse Rate 09/07/24 1647 67     Resp 09/07/24 1647 18     Temp 09/07/24 1647 98.1 F (36.7 C)     Temp Source 09/07/24 1647 Oral     SpO2 09/07/24 1647 98 %     Weight --      Height --      Head Circumference --      Peak Flow --      Pain Score 09/07/24 1645 10     Pain Loc --      Pain Education --      Exclude from Growth Chart --     Most recent vital signs: Vitals:   09/07/24 1922 09/07/24 2017  BP:  (!) 157/69  Pulse:  (!) 59  Resp:  16  Temp:    SpO2: 94% 94%    Constitutional: Alert and oriented. Eyes: Conjunctivae are normal. Head: Atraumatic. Nose: No congestion/rhinnorhea. Mouth/Throat: Mucous membranes are moist. Cardiovascular: Normal rate, regular rhythm. Grossly normal heart sounds.  2+ radial  pulses bilaterally. Respiratory: Normal respiratory effort.  No retractions. Lungs CTAB. Gastrointestinal: Soft and nontender. No distention. Musculoskeletal: No lower extremity tenderness nor edema.  Neurologic:  Normal speech and language. No gross focal neurologic deficits are appreciated.    ED Results / Procedures / Treatments   Labs (all labs ordered are listed, but only abnormal results are displayed) Labs Reviewed  COMPREHENSIVE METABOLIC PANEL WITH GFR - Abnormal; Notable for the following components:      Result Value   Glucose, Bld 159 (*)    All other components within normal limits  CBG MONITORING, ED - Abnormal; Notable for the following components:   Glucose-Capillary 164 (*)    All other components within normal limits  PROTIME-INR  APTT  CBC  DIFFERENTIAL  ETHANOL     EKG  ED ECG REPORT I, Carlin Palin, the attending physician, personally viewed and interpreted this ECG.   Date: 09/07/2024  EKG Time: 16:48  Rate: 67  Rhythm: atrial fibrillation  Axis: LAD  Intervals:none  ST&T Change: None  RADIOLOGY CT head  reviewed and interpreted by me with small bilateral hematoma, no midline shift noted.  PROCEDURES:  Critical Care performed: No  Procedures   MEDICATIONS ORDERED IN ED: Medications  sodium chloride  flush (NS) 0.9 % injection 3 mL ( Intravenous Canceled Entry 09/07/24 1654)     IMPRESSION / MDM / ASSESSMENT AND PLAN / ED COURSE  I reviewed the triage vital signs and the nursing notes.                              84 y.o. male with past medical history of hypertension, diabetes, CAD, CHF, atrial fibrillation on Eliquis , and stroke who presents to the ED complaining of increasing headache and dizziness after a fall 2 days ago, at which point he was diagnosed with an SDH.  Patient's presentation is most consistent with acute presentation with potential threat to life or bodily function.  Differential diagnosis includes, but is not  limited to, worsening SDH, stroke, anemia, electrolyte abnormality, AKI, orthostatic hypotension.  Patient well-appearing and in no acute distress, vital signs are unremarkable.  EKG shows no evidence of arrhythmia or ischemia and patient has a nonfocal neurologic exam.  Labs without significant anemia, leukocytosis, electrolyte abnormality, or AKI.  Repeat CT imaging of the head shows improvement of SDH around the falx, but with apparent new small areas of SDH bilaterally without midline shift.  These findings were reviewed with Dr. Katrina of neurosurgery, who states that no intervention needed and patient does not need repeat CT imaging tonight, may follow-up as an outpatient.  On reassessment, patient is ambulatory without difficulty, states he feels much better.  Family in agreement with outpatient management, he was counseled to return to the ED for new or worsening symptoms.      FINAL CLINICAL IMPRESSION(S) / ED DIAGNOSES   Final diagnoses:  Dizziness  Subdural hematoma (HCC)     Rx / DC Orders   ED Discharge Orders     None        Note:  This document was prepared using Dragon voice recognition software and may include unintentional dictation errors.   Willo Dunnings, MD 09/07/24 2038  "

## 2024-09-07 NOTE — ED Triage Notes (Signed)
 Pt to ED via POV from home. Pt reports was here on 1/29 for syncope and dx with brain bleed and right sided rib fx. Pt reports today has had trouble with his balance and dizziness. Pt reports has been off Eliquis  x5 days.

## 2024-09-07 NOTE — ED Notes (Signed)
 Fall bracelet and bed alarm applied due to pt's fall risk for dizziness and age.

## 2024-09-07 NOTE — ED Notes (Signed)
 Pt ambulated in room per EDP [place] verbal order.  Gait steady, tolerated well.  Denies dizziness.

## 2024-09-07 NOTE — Discharge Instructions (Signed)
 You should hold your blood thinner for a total of 2 weeks after your fall.  Please follow-up with your primary care doctor or neurosurgery and return to the ER for any new or worsening symptoms.

## 2024-12-16 ENCOUNTER — Ambulatory Visit: Admitting: Cardiology

## 2025-02-13 ENCOUNTER — Ambulatory Visit
